# Patient Record
Sex: Female | Born: 1955 | Race: Black or African American | Hispanic: No | Marital: Single | Smoking: Former smoker
Health system: Southern US, Community
[De-identification: ages and names within clinical notes are randomized; demographics above are authoritative.]

## PROBLEM LIST (undated history)

## (undated) DIAGNOSIS — R0600 Dyspnea, unspecified: Secondary | ICD-10-CM

## (undated) DIAGNOSIS — G4700 Insomnia, unspecified: Secondary | ICD-10-CM

## (undated) DIAGNOSIS — I10 Essential (primary) hypertension: Secondary | ICD-10-CM

## (undated) DIAGNOSIS — B1920 Unspecified viral hepatitis C without hepatic coma: Secondary | ICD-10-CM

## (undated) DIAGNOSIS — Z87442 Personal history of urinary calculi: Secondary | ICD-10-CM

## (undated) DIAGNOSIS — E559 Vitamin D deficiency, unspecified: Secondary | ICD-10-CM

## (undated) DIAGNOSIS — Z9221 Personal history of antineoplastic chemotherapy: Secondary | ICD-10-CM

## (undated) DIAGNOSIS — N393 Stress incontinence (female) (male): Secondary | ICD-10-CM

## (undated) DIAGNOSIS — J189 Pneumonia, unspecified organism: Secondary | ICD-10-CM

## (undated) DIAGNOSIS — F32A Depression, unspecified: Secondary | ICD-10-CM

## (undated) DIAGNOSIS — D649 Anemia, unspecified: Secondary | ICD-10-CM

## (undated) DIAGNOSIS — K219 Gastro-esophageal reflux disease without esophagitis: Secondary | ICD-10-CM

## (undated) DIAGNOSIS — M1990 Unspecified osteoarthritis, unspecified site: Secondary | ICD-10-CM

## (undated) DIAGNOSIS — F419 Anxiety disorder, unspecified: Secondary | ICD-10-CM

## (undated) DIAGNOSIS — R413 Other amnesia: Secondary | ICD-10-CM

## (undated) DIAGNOSIS — Z8572 Personal history of non-Hodgkin lymphomas: Secondary | ICD-10-CM

## (undated) DIAGNOSIS — F329 Major depressive disorder, single episode, unspecified: Secondary | ICD-10-CM

## (undated) DIAGNOSIS — F4310 Post-traumatic stress disorder, unspecified: Secondary | ICD-10-CM

## (undated) DIAGNOSIS — C8590 Non-Hodgkin lymphoma, unspecified, unspecified site: Secondary | ICD-10-CM

## (undated) HISTORY — DX: Vitamin D deficiency, unspecified: E55.9

## (undated) HISTORY — DX: Non-Hodgkin lymphoma, unspecified, unspecified site: C85.90

## (undated) HISTORY — DX: Unspecified viral hepatitis C without hepatic coma: B19.20

## (undated) HISTORY — DX: Personal history of antineoplastic chemotherapy: Z92.21

## (undated) HISTORY — DX: Depression, unspecified: F32.A

## (undated) HISTORY — DX: Insomnia, unspecified: G47.00

## (undated) HISTORY — DX: Personal history of non-Hodgkin lymphomas: Z85.72

## (undated) HISTORY — PX: APPENDECTOMY: SHX54

## (undated) HISTORY — PX: TRIGGER FINGER RELEASE: SHX641

## (undated) HISTORY — PX: ABDOMINAL HYSTERECTOMY: SUR658

## (undated) HISTORY — DX: Major depressive disorder, single episode, unspecified: F32.9

---

## 1997-07-15 ENCOUNTER — Encounter: Admission: RE | Admit: 1997-07-15 | Discharge: 1997-07-15 | Payer: Self-pay | Admitting: *Deleted

## 2004-01-10 DIAGNOSIS — M797 Fibromyalgia: Secondary | ICD-10-CM

## 2004-01-10 HISTORY — DX: Fibromyalgia: M79.7

## 2004-06-05 ENCOUNTER — Emergency Department (HOSPITAL_COMMUNITY): Admission: EM | Admit: 2004-06-05 | Discharge: 2004-06-05 | Payer: Self-pay | Admitting: Emergency Medicine

## 2004-10-11 ENCOUNTER — Ambulatory Visit: Payer: Self-pay | Admitting: Gastroenterology

## 2004-11-08 ENCOUNTER — Encounter (INDEPENDENT_AMBULATORY_CARE_PROVIDER_SITE_OTHER): Payer: Self-pay | Admitting: Specialist

## 2004-11-08 ENCOUNTER — Ambulatory Visit (HOSPITAL_COMMUNITY): Admission: RE | Admit: 2004-11-08 | Discharge: 2004-11-08 | Payer: Self-pay | Admitting: Gastroenterology

## 2004-11-13 ENCOUNTER — Emergency Department (HOSPITAL_COMMUNITY): Admission: EM | Admit: 2004-11-13 | Discharge: 2004-11-14 | Payer: Self-pay | Admitting: Emergency Medicine

## 2004-12-16 ENCOUNTER — Ambulatory Visit: Payer: Self-pay | Admitting: Gastroenterology

## 2005-05-21 ENCOUNTER — Emergency Department (HOSPITAL_COMMUNITY): Admission: EM | Admit: 2005-05-21 | Discharge: 2005-05-21 | Payer: Self-pay | Admitting: Emergency Medicine

## 2005-10-03 ENCOUNTER — Ambulatory Visit: Payer: Self-pay | Admitting: *Deleted

## 2005-10-03 ENCOUNTER — Ambulatory Visit: Payer: Self-pay | Admitting: Internal Medicine

## 2005-10-13 ENCOUNTER — Ambulatory Visit: Payer: Self-pay | Admitting: Nurse Practitioner

## 2005-11-02 ENCOUNTER — Ambulatory Visit: Payer: Self-pay | Admitting: Nurse Practitioner

## 2005-11-27 ENCOUNTER — Ambulatory Visit: Payer: Self-pay | Admitting: Nurse Practitioner

## 2005-11-27 ENCOUNTER — Other Ambulatory Visit: Admission: RE | Admit: 2005-11-27 | Discharge: 2005-11-27 | Payer: Self-pay | Admitting: Family Medicine

## 2005-12-05 ENCOUNTER — Encounter: Admission: RE | Admit: 2005-12-05 | Discharge: 2005-12-05 | Payer: Self-pay | Admitting: Family Medicine

## 2006-02-14 ENCOUNTER — Encounter (INDEPENDENT_AMBULATORY_CARE_PROVIDER_SITE_OTHER): Payer: Self-pay | Admitting: Specialist

## 2006-02-14 ENCOUNTER — Ambulatory Visit: Payer: Self-pay | Admitting: Nurse Practitioner

## 2006-02-14 ENCOUNTER — Other Ambulatory Visit: Admission: RE | Admit: 2006-02-14 | Discharge: 2006-02-14 | Payer: Self-pay | Admitting: Nurse Practitioner

## 2006-02-28 ENCOUNTER — Ambulatory Visit: Payer: Self-pay | Admitting: Nurse Practitioner

## 2006-03-14 ENCOUNTER — Ambulatory Visit: Payer: Self-pay | Admitting: Family Medicine

## 2006-03-29 ENCOUNTER — Ambulatory Visit: Payer: Self-pay | Admitting: Family Medicine

## 2006-03-29 ENCOUNTER — Other Ambulatory Visit: Admission: RE | Admit: 2006-03-29 | Discharge: 2006-03-29 | Payer: Self-pay | Admitting: Nurse Practitioner

## 2006-03-29 ENCOUNTER — Encounter (INDEPENDENT_AMBULATORY_CARE_PROVIDER_SITE_OTHER): Payer: Self-pay | Admitting: *Deleted

## 2006-04-11 ENCOUNTER — Ambulatory Visit: Payer: Self-pay | Admitting: Nurse Practitioner

## 2006-04-13 ENCOUNTER — Ambulatory Visit (HOSPITAL_COMMUNITY): Admission: RE | Admit: 2006-04-13 | Discharge: 2006-04-13 | Payer: Self-pay | Admitting: Family Medicine

## 2006-05-03 ENCOUNTER — Ambulatory Visit: Payer: Self-pay | Admitting: Nurse Practitioner

## 2006-07-10 ENCOUNTER — Ambulatory Visit: Payer: Self-pay | Admitting: Internal Medicine

## 2006-09-26 ENCOUNTER — Encounter (INDEPENDENT_AMBULATORY_CARE_PROVIDER_SITE_OTHER): Payer: Self-pay | Admitting: *Deleted

## 2007-05-03 ENCOUNTER — Ambulatory Visit: Payer: Self-pay | Admitting: Internal Medicine

## 2007-05-03 ENCOUNTER — Encounter (INDEPENDENT_AMBULATORY_CARE_PROVIDER_SITE_OTHER): Payer: Self-pay | Admitting: Nurse Practitioner

## 2007-05-03 LAB — CONVERTED CEMR LAB
ALT: 56 units/L — ABNORMAL HIGH (ref 0–35)
AST: 45 units/L — ABNORMAL HIGH (ref 0–37)
Albumin: 4 g/dL (ref 3.5–5.2)
Alkaline Phosphatase: 96 units/L (ref 39–117)
BUN: 5 mg/dL — ABNORMAL LOW (ref 6–23)
Basophils Absolute: 0 10*3/uL (ref 0.0–0.1)
Basophils Relative: 1 % (ref 0–1)
CO2: 24 meq/L (ref 19–32)
Calcium: 9.1 mg/dL (ref 8.4–10.5)
Chloride: 105 meq/L (ref 96–112)
Creatinine, Ser: 0.62 mg/dL (ref 0.40–1.20)
Eosinophils Absolute: 0.1 10*3/uL (ref 0.0–0.7)
Eosinophils Relative: 3 % (ref 0–5)
Glucose, Bld: 89 mg/dL (ref 70–99)
HCT: 40.7 % (ref 36.0–46.0)
Hemoglobin: 12.9 g/dL (ref 12.0–15.0)
Lymphocytes Relative: 38 % (ref 12–46)
Lymphs Abs: 1.8 10*3/uL (ref 0.7–4.0)
MCHC: 31.7 g/dL (ref 30.0–36.0)
MCV: 80.4 fL (ref 78.0–100.0)
Monocytes Absolute: 0.5 10*3/uL (ref 0.1–1.0)
Monocytes Relative: 10 % (ref 3–12)
Neutro Abs: 2.2 10*3/uL (ref 1.7–7.7)
Neutrophils Relative %: 49 % (ref 43–77)
Platelets: 235 10*3/uL (ref 150–400)
Potassium: 3.8 meq/L (ref 3.5–5.3)
RBC: 5.06 M/uL (ref 3.87–5.11)
RDW: 13.6 % (ref 11.5–15.5)
Sodium: 141 meq/L (ref 135–145)
TSH: 1.838 microintl units/mL (ref 0.350–5.50)
Total Bilirubin: 0.5 mg/dL (ref 0.3–1.2)
Total Protein: 8.3 g/dL (ref 6.0–8.3)
WBC: 4.6 10*3/uL (ref 4.0–10.5)

## 2007-07-03 ENCOUNTER — Ambulatory Visit: Payer: Self-pay | Admitting: Family Medicine

## 2007-07-04 ENCOUNTER — Encounter (INDEPENDENT_AMBULATORY_CARE_PROVIDER_SITE_OTHER): Payer: Self-pay | Admitting: Family Medicine

## 2008-04-28 ENCOUNTER — Ambulatory Visit: Payer: Self-pay | Admitting: Internal Medicine

## 2008-05-04 ENCOUNTER — Ambulatory Visit (HOSPITAL_COMMUNITY): Admission: RE | Admit: 2008-05-04 | Discharge: 2008-05-04 | Payer: Self-pay | Admitting: Internal Medicine

## 2008-05-06 ENCOUNTER — Encounter (INDEPENDENT_AMBULATORY_CARE_PROVIDER_SITE_OTHER): Payer: Self-pay | Admitting: *Deleted

## 2008-05-26 ENCOUNTER — Ambulatory Visit: Payer: Self-pay | Admitting: Internal Medicine

## 2008-06-26 ENCOUNTER — Ambulatory Visit: Payer: Self-pay | Admitting: Internal Medicine

## 2008-07-02 ENCOUNTER — Ambulatory Visit: Payer: Self-pay | Admitting: Internal Medicine

## 2008-07-16 ENCOUNTER — Ambulatory Visit: Payer: Self-pay | Admitting: Family Medicine

## 2008-08-14 ENCOUNTER — Ambulatory Visit: Payer: Self-pay | Admitting: Gastroenterology

## 2008-09-02 ENCOUNTER — Ambulatory Visit: Payer: Self-pay | Admitting: Internal Medicine

## 2008-09-25 ENCOUNTER — Encounter (INDEPENDENT_AMBULATORY_CARE_PROVIDER_SITE_OTHER): Payer: Self-pay | Admitting: Internal Medicine

## 2008-09-25 ENCOUNTER — Ambulatory Visit: Payer: Self-pay | Admitting: Internal Medicine

## 2008-09-25 LAB — CONVERTED CEMR LAB
ALT: 38 units/L — ABNORMAL HIGH (ref 0–35)
AST: 35 units/L (ref 0–37)
Albumin: 3.9 g/dL (ref 3.5–5.2)
Alkaline Phosphatase: 89 units/L (ref 39–117)
BUN: 8 mg/dL (ref 6–23)
Basophils Absolute: 0 10*3/uL (ref 0.0–0.1)
Basophils Relative: 1 % (ref 0–1)
CO2: 26 meq/L (ref 19–32)
Calcium: 9.1 mg/dL (ref 8.4–10.5)
Chloride: 102 meq/L (ref 96–112)
Creatinine, Ser: 0.64 mg/dL (ref 0.40–1.20)
Eosinophils Absolute: 0.1 10*3/uL (ref 0.0–0.7)
Eosinophils Relative: 2 % (ref 0–5)
Glucose, Bld: 82 mg/dL (ref 70–99)
HCT: 40.7 % (ref 36.0–46.0)
Hemoglobin: 12.9 g/dL (ref 12.0–15.0)
Lymphocytes Relative: 38 % (ref 12–46)
Lymphs Abs: 1.7 10*3/uL (ref 0.7–4.0)
MCHC: 31.7 g/dL (ref 30.0–36.0)
MCV: 79.8 fL (ref 78.0–100.0)
Monocytes Absolute: 0.4 10*3/uL (ref 0.1–1.0)
Monocytes Relative: 9 % (ref 3–12)
Neutro Abs: 2.3 10*3/uL (ref 1.7–7.7)
Neutrophils Relative %: 50 % (ref 43–77)
Platelets: 247 10*3/uL (ref 150–400)
Potassium: 4 meq/L (ref 3.5–5.3)
RBC: 5.1 M/uL (ref 3.87–5.11)
RDW: 13.6 % (ref 11.5–15.5)
Sodium: 139 meq/L (ref 135–145)
TSH: 2.224 microintl units/mL (ref 0.350–4.500)
Total Bilirubin: 0.6 mg/dL (ref 0.3–1.2)
Total Protein: 8.5 g/dL — ABNORMAL HIGH (ref 6.0–8.3)
WBC: 4.6 10*3/uL (ref 4.0–10.5)

## 2008-09-26 ENCOUNTER — Encounter (INDEPENDENT_AMBULATORY_CARE_PROVIDER_SITE_OTHER): Payer: Self-pay | Admitting: Internal Medicine

## 2009-01-06 ENCOUNTER — Ambulatory Visit: Payer: Self-pay | Admitting: Internal Medicine

## 2009-01-06 LAB — CONVERTED CEMR LAB
ALT: 52 units/L — ABNORMAL HIGH (ref 0–35)
ANA Titer 1: 1:160 {titer} — ABNORMAL HIGH
AST: 46 units/L — ABNORMAL HIGH (ref 0–37)
Albumin: 3.9 g/dL (ref 3.5–5.2)
Alkaline Phosphatase: 93 units/L (ref 39–117)
Anti Nuclear Antibody(ANA): POSITIVE — AB
BUN: 9 mg/dL (ref 6–23)
CO2: 22 meq/L (ref 19–32)
Calcium: 8.7 mg/dL (ref 8.4–10.5)
Chloride: 103 meq/L (ref 96–112)
Creatinine, Ser: 0.75 mg/dL (ref 0.40–1.20)
Glucose, Bld: 102 mg/dL — ABNORMAL HIGH (ref 70–99)
Potassium: 4 meq/L (ref 3.5–5.3)
Rheumatoid fact SerPl-aCnc: <20 intl units/mL (ref 0–20)
Sodium: 138 meq/L (ref 135–145)
Total Bilirubin: 0.5 mg/dL (ref 0.3–1.2)
Total Protein: 8.1 g/dL (ref 6.0–8.3)

## 2009-02-09 ENCOUNTER — Ambulatory Visit: Payer: Self-pay | Admitting: Internal Medicine

## 2009-02-09 LAB — CONVERTED CEMR LAB
Rheumatoid fact SerPl-aCnc: <20 intl units/mL (ref 0–20)
ds DNA Ab: 10 — ABNORMAL HIGH (ref <5)

## 2009-02-10 ENCOUNTER — Encounter (INDEPENDENT_AMBULATORY_CARE_PROVIDER_SITE_OTHER): Payer: Self-pay | Admitting: Internal Medicine

## 2009-02-24 ENCOUNTER — Ambulatory Visit: Payer: Self-pay | Admitting: Internal Medicine

## 2009-02-26 ENCOUNTER — Ambulatory Visit (HOSPITAL_COMMUNITY): Admission: RE | Admit: 2009-02-26 | Discharge: 2009-02-26 | Payer: Self-pay | Admitting: Internal Medicine

## 2009-03-03 ENCOUNTER — Ambulatory Visit: Payer: Self-pay | Admitting: Internal Medicine

## 2009-03-31 ENCOUNTER — Ambulatory Visit: Payer: Self-pay | Admitting: Internal Medicine

## 2009-04-05 ENCOUNTER — Ambulatory Visit: Payer: Self-pay | Admitting: Internal Medicine

## 2009-04-28 ENCOUNTER — Ambulatory Visit: Payer: Self-pay | Admitting: Internal Medicine

## 2009-04-29 ENCOUNTER — Ambulatory Visit: Payer: Self-pay | Admitting: Internal Medicine

## 2009-05-05 ENCOUNTER — Ambulatory Visit (HOSPITAL_COMMUNITY): Admission: RE | Admit: 2009-05-05 | Discharge: 2009-05-05 | Payer: Self-pay | Admitting: Internal Medicine

## 2009-06-09 ENCOUNTER — Ambulatory Visit: Payer: Self-pay | Admitting: Internal Medicine

## 2009-06-09 LAB — CONVERTED CEMR LAB
ALT: 31 units/L (ref 0–35)
AST: 34 units/L (ref 0–37)
Albumin: 3.9 g/dL (ref 3.5–5.2)
Alkaline Phosphatase: 82 units/L (ref 39–117)
BUN: 6 mg/dL (ref 6–23)
CO2: 24 meq/L (ref 19–32)
CRP: 1.4 mg/dL — ABNORMAL HIGH (ref <0.6)
Calcium: 9 mg/dL (ref 8.4–10.5)
Chloride: 102 meq/L (ref 96–112)
Creatinine, Ser: 0.73 mg/dL (ref 0.40–1.20)
Glucose, Bld: 85 mg/dL (ref 70–99)
Potassium: 4 meq/L (ref 3.5–5.3)
Sodium: 135 meq/L (ref 135–145)
Total Bilirubin: 0.5 mg/dL (ref 0.3–1.2)
Total Protein: 8.4 g/dL — ABNORMAL HIGH (ref 6.0–8.3)

## 2009-07-13 ENCOUNTER — Ambulatory Visit: Payer: Self-pay | Admitting: Internal Medicine

## 2009-07-13 LAB — CONVERTED CEMR LAB
BUN: 8 mg/dL (ref 6–23)
CO2: 25 meq/L (ref 19–32)
Calcium: 9.1 mg/dL (ref 8.4–10.5)
Chloride: 106 meq/L (ref 96–112)
Creatinine, Ser: 0.75 mg/dL (ref 0.40–1.20)
Glucose, Bld: 76 mg/dL (ref 70–99)
Potassium: 4.3 meq/L (ref 3.5–5.3)
Sodium: 136 meq/L (ref 135–145)
Uric Acid, Serum: 7.6 mg/dL — ABNORMAL HIGH (ref 2.4–7.0)

## 2009-07-14 ENCOUNTER — Ambulatory Visit (HOSPITAL_COMMUNITY): Admission: RE | Admit: 2009-07-14 | Discharge: 2009-07-14 | Payer: Self-pay | Admitting: Internal Medicine

## 2009-07-14 ENCOUNTER — Encounter (INDEPENDENT_AMBULATORY_CARE_PROVIDER_SITE_OTHER): Payer: Self-pay | Admitting: Internal Medicine

## 2009-07-20 ENCOUNTER — Ambulatory Visit: Payer: Self-pay | Admitting: Internal Medicine

## 2009-08-12 ENCOUNTER — Ambulatory Visit: Payer: Self-pay | Admitting: Internal Medicine

## 2009-08-13 ENCOUNTER — Ambulatory Visit: Payer: Self-pay | Admitting: Internal Medicine

## 2009-09-07 ENCOUNTER — Ambulatory Visit: Payer: Self-pay | Admitting: Internal Medicine

## 2009-09-15 ENCOUNTER — Ambulatory Visit: Payer: Self-pay | Admitting: Obstetrics & Gynecology

## 2009-09-16 ENCOUNTER — Encounter: Payer: Self-pay | Admitting: Obstetrics & Gynecology

## 2010-03-24 LAB — POCT URINALYSIS DIPSTICK
Bilirubin Urine: NEGATIVE
Glucose, UA: NEGATIVE mg/dL
Ketones, ur: NEGATIVE mg/dL
Nitrite: NEGATIVE
Protein, ur: NEGATIVE mg/dL
Specific Gravity, Urine: <=1.005 (ref 1.005–1.030)
Urobilinogen, UA: 0.2 mg/dL (ref 0.0–1.0)
pH: 5 (ref 5.0–8.0)

## 2010-03-28 ENCOUNTER — Other Ambulatory Visit (HOSPITAL_COMMUNITY): Payer: Self-pay | Admitting: Internal Medicine

## 2010-03-28 DIAGNOSIS — Z1231 Encounter for screening mammogram for malignant neoplasm of breast: Secondary | ICD-10-CM

## 2010-05-09 ENCOUNTER — Ambulatory Visit (HOSPITAL_COMMUNITY)
Admission: RE | Admit: 2010-05-09 | Discharge: 2010-05-09 | Disposition: A | Discharge status: Home / Self Care | Payer: Self-pay | Source: Ambulatory Visit | Attending: Internal Medicine | Admitting: Internal Medicine

## 2010-05-09 DIAGNOSIS — Z1231 Encounter for screening mammogram for malignant neoplasm of breast: Secondary | ICD-10-CM | POA: Insufficient documentation

## 2010-10-17 DIAGNOSIS — M159 Polyosteoarthritis, unspecified: Secondary | ICD-10-CM | POA: Insufficient documentation

## 2010-10-31 ENCOUNTER — Emergency Department (HOSPITAL_COMMUNITY)
Admission: EM | Admit: 2010-10-31 | Discharge: 2010-11-01 | Disposition: A | Discharge status: Home / Self Care | Payer: Self-pay | Attending: Emergency Medicine | Admitting: Emergency Medicine

## 2010-10-31 ENCOUNTER — Emergency Department (HOSPITAL_COMMUNITY): Payer: Self-pay

## 2010-10-31 DIAGNOSIS — R079 Chest pain, unspecified: Secondary | ICD-10-CM | POA: Insufficient documentation

## 2010-10-31 DIAGNOSIS — R112 Nausea with vomiting, unspecified: Secondary | ICD-10-CM | POA: Insufficient documentation

## 2010-10-31 DIAGNOSIS — I10 Essential (primary) hypertension: Secondary | ICD-10-CM | POA: Insufficient documentation

## 2010-10-31 DIAGNOSIS — R0602 Shortness of breath: Secondary | ICD-10-CM | POA: Insufficient documentation

## 2010-10-31 DIAGNOSIS — Z8619 Personal history of other infectious and parasitic diseases: Secondary | ICD-10-CM | POA: Insufficient documentation

## 2010-10-31 DIAGNOSIS — K219 Gastro-esophageal reflux disease without esophagitis: Secondary | ICD-10-CM | POA: Insufficient documentation

## 2010-10-31 LAB — POCT I-STAT TROPONIN I: Troponin i, poc: 0 ng/mL (ref 0.00–0.08)

## 2010-10-31 LAB — POCT I-STAT, CHEM 8
Calcium, Ion: 1.24 mmol/L (ref 1.12–1.32)
Chloride: 103 mEq/L (ref 96–112)
Creatinine, Ser: 0.8 mg/dL (ref 0.50–1.10)
Glucose, Bld: 102 mg/dL — ABNORMAL HIGH (ref 70–99)
HCT: 46 % (ref 36.0–46.0)
Potassium: 4.1 mEq/L (ref 3.5–5.1)
TCO2: 25 mmol/L (ref 0–100)

## 2010-11-01 LAB — HEPATIC FUNCTION PANEL
Bilirubin, Direct: 0.1 mg/dL (ref 0.0–0.3)
Indirect Bilirubin: 0.4 mg/dL (ref 0.3–0.9)
Total Bilirubin: 0.5 mg/dL (ref 0.3–1.2)

## 2010-11-01 LAB — CK TOTAL AND CKMB (NOT AT ARMC)
CK, MB: 1 ng/mL (ref 0.3–4.0)
Relative Index: INVALID (ref 0.0–2.5)
Total CK: 98 U/L (ref 7–177)

## 2010-11-01 LAB — CBC
MCH: 25.6 pg — ABNORMAL LOW (ref 26.0–34.0)
MCHC: 32.5 g/dL (ref 30.0–36.0)
MCV: 78.7 fL (ref 78.0–100.0)
Platelets: 226 10*3/uL (ref 150–400)
RDW: 13.1 % (ref 11.5–15.5)
WBC: 5 10*3/uL (ref 4.0–10.5)

## 2010-11-01 LAB — DIFFERENTIAL
Eosinophils Absolute: 0.2 10*3/uL (ref 0.0–0.7)
Eosinophils Relative: 3 % (ref 0–5)
Lymphs Abs: 2.5 10*3/uL (ref 0.7–4.0)
Monocytes Absolute: 0.5 10*3/uL (ref 0.1–1.0)
Monocytes Relative: 10 % (ref 3–12)

## 2010-11-01 LAB — LIPASE, BLOOD: Lipase: 34 U/L (ref 11–59)

## 2010-11-25 ENCOUNTER — Ambulatory Visit: Payer: Self-pay | Admitting: Internal Medicine

## 2011-01-17 ENCOUNTER — Other Ambulatory Visit: Payer: Self-pay | Admitting: Gastroenterology

## 2011-01-17 ENCOUNTER — Ambulatory Visit (HOSPITAL_COMMUNITY)
Admission: RE | Admit: 2011-01-17 | Discharge: 2011-01-17 | Disposition: A | Discharge status: Home / Self Care | Payer: Self-pay | Source: Ambulatory Visit | Attending: Gastroenterology | Admitting: Gastroenterology

## 2011-01-17 ENCOUNTER — Encounter (HOSPITAL_COMMUNITY): Payer: Self-pay

## 2011-01-17 ENCOUNTER — Encounter (HOSPITAL_COMMUNITY)
Admission: RE | Disposition: A | Discharge status: Home / Self Care | Payer: Self-pay | Source: Ambulatory Visit | Attending: Gastroenterology

## 2011-01-17 DIAGNOSIS — K319 Disease of stomach and duodenum, unspecified: Secondary | ICD-10-CM | POA: Insufficient documentation

## 2011-01-17 DIAGNOSIS — K921 Melena: Secondary | ICD-10-CM | POA: Insufficient documentation

## 2011-01-17 DIAGNOSIS — K648 Other hemorrhoids: Secondary | ICD-10-CM | POA: Insufficient documentation

## 2011-01-17 HISTORY — PX: COLONOSCOPY: SHX5424

## 2011-01-17 HISTORY — DX: Essential (primary) hypertension: I10

## 2011-01-17 HISTORY — DX: Anemia, unspecified: D64.9

## 2011-01-17 HISTORY — DX: Gastro-esophageal reflux disease without esophagitis: K21.9

## 2011-01-17 HISTORY — DX: Unspecified osteoarthritis, unspecified site: M19.90

## 2011-01-17 HISTORY — PX: ESOPHAGOGASTRODUODENOSCOPY: SHX5428

## 2011-01-17 HISTORY — DX: Anxiety disorder, unspecified: F41.9

## 2011-01-17 SURGERY — EGD (ESOPHAGOGASTRODUODENOSCOPY)
Anesthesia: Moderate Sedation

## 2011-01-17 MED ORDER — MIDAZOLAM HCL 10 MG/2ML IJ SOLN
INTRAMUSCULAR | Status: DC | PRN
  Administered 2011-01-17: 1 mg via INTRAVENOUS
  Administered 2011-01-17 (×3): 2 mg via INTRAVENOUS
  Administered 2011-01-17: 1 mg via INTRAVENOUS
  Administered 2011-01-17: 2 mg via INTRAVENOUS

## 2011-01-17 MED ORDER — FENTANYL CITRATE 0.05 MG/ML IJ SOLN
INTRAMUSCULAR | Status: DC | PRN
  Administered 2011-01-17 (×4): 25 ug via INTRAVENOUS

## 2011-01-17 MED ORDER — MIDAZOLAM HCL 10 MG/2ML IJ SOLN
INTRAMUSCULAR | Status: AC
  Filled 2011-01-17: qty 2

## 2011-01-17 MED ORDER — SODIUM CHLORIDE 0.9 % IV SOLN
Freq: Once | INTRAVENOUS | Status: AC
  Administered 2011-01-17: 500 mL via INTRAVENOUS

## 2011-01-17 MED ORDER — FENTANYL CITRATE 0.05 MG/ML IJ SOLN
INTRAMUSCULAR | Status: AC
  Filled 2011-01-17: qty 2

## 2011-01-17 NOTE — Op Note (Signed)
[facility] [address] [place], [place]  [address]  ENDOSCOPY PROCEDURE REPORT  PATIENT:  Rachael, West  MR#:  [id number] BIRTHDATE:  [birth year], 55 yrs. old  GENDER:  female  ENDOSCOPIST:  Carman Ching Referred by:  Marsa Aris [facility],  PROCEDURE DATE:  01/17/2011 PROCEDURE:  EGD with biopsy, 43239 ASA CLASS:  Class II INDICATIONS:  blood in stool , nausea  MEDICATIONS:   Fentanyl 50 mcg IV, Versed 5 mg IV TOPICAL ANESTHETIC:  Cetacaine Spray  DESCRIPTION OF PROCEDURE:   After the risks and benefits of the procedure were explained, informed consent was obtained.  The endoscope was introduced through the mouth and advanced to the second portion of the duodenum.  The instrument was slowly withdrawn as the mucosa was fully examined. <<PROCEDUREIMAGES>>  A nodule was found in the body of the stomach. this had the appearance of a small submucosal nodule. With standard forceps, a biopsy was obtained and sent to pathology.    Retroflexed views revealed no abnormalities.    The scope was then withdrawn from the patient and the procedure completed.  COMPLICATIONS:  A complication of none occurred on 01/17/2011 at.  ENDOSCOPIC IMPRESSION: 1) Nodule in the body of the stomach 2) Blood in stool without obvious cause on EGD RECOMMENDATIONS: 1) Call to scheule a follow-up appointment with primary MD PRN 2) Proceed with a Colonscopy.  ______________________________ Carman Ching  CC:  [facility]  n. eSIGNEDCarman Ching at 01/17/2011 11:05 AM  Susa Loffler, [phone]

## 2011-01-17 NOTE — H&P (Signed)
See scanned H&P from office 

## 2011-01-17 NOTE — Op Note (Signed)
[facility] [address] [place], [place]  [address]  COLONOSCOPY PROCEDURE REPORT  PATIENT:  Rachael West, Rachael West  MR#:  [id number] BIRTHDATE:  [birth year], 55 yrs. old  GENDER:  female ENDOSCOPIST:  Carman Ching REF. BY:  [facility], PROCEDURE DATE:  01/17/2011 PROCEDURE:  Colonoscopy 19147 ASA CLASS:  Class II INDICATIONS:  blood in stool MEDICATIONS:   Fentanyl 100 mcg IV, Versed 10 mg IV  DESCRIPTION OF PROCEDURE:   After the risks and benefits and of the procedure were explained, informed consent was obtained. Digital rectal exam was performed and revealed no abnormalities. The  endoscope was introduced through the anus and advanced to the cecum.  The quality of the prep was fair..  The instrument was then slowly withdrawn as the colon was fully examined. <<PROCEDUREIMAGES>>  FINDINGS:  Internal Hemorrhoids were found in the rectum. Retroflexion was not performed.  The scope was then withdrawn from the patient and the procedure completed.  COMPLICATIONS:  A complication of none occured on 01/17/2011 at. ENDOSCOPIC IMPRESSION: 1) Internal hemorrhoids in the rectum RECOMMENDATIONS: 1) Begin yearly stool checks for blood in 3 years and repeat colonoscopy in 10 years.  REPEAT EXAM:  In 10 year(s) for Colonoscopy.  ______________________________ Carman Ching  CC:  [facility]  n. eSIGNEDCarman Ching at 01/17/2011 11:17 AM  Susa Loffler, [phone]

## 2011-01-18 ENCOUNTER — Encounter (HOSPITAL_COMMUNITY): Payer: Self-pay | Admitting: Gastroenterology

## 2011-01-18 ENCOUNTER — Encounter (HOSPITAL_COMMUNITY): Payer: Self-pay

## 2011-01-27 NOTE — H&P (Signed)
  H+P from office reviewed (scanned) Pt examined Chart reviewed Risks and benefits have been explained to pt. The patient was scheduled for outpatient procedure EGD and colonoscopy [facility]. Her previously performed the H&P was reviewed in detail. Her history was unchanged. Her medications were reviewed individually and she was continuing the same medications. Physical exam: The patient was examined just before the procedure and this is all been noted on previous section of the endoscopy report Impression: One nausea and vomiting 2 epigastric abdominal pain 3 heme positive stools Plan: The patient will undergo EGD and colonoscopy as per all the previous notes. This is being dictated and everything repeated because of the hospital requirement.

## 2011-02-10 DIAGNOSIS — N393 Stress incontinence (female) (male): Secondary | ICD-10-CM | POA: Insufficient documentation

## 2011-02-10 DIAGNOSIS — N3941 Urge incontinence: Secondary | ICD-10-CM | POA: Insufficient documentation

## 2011-05-25 DIAGNOSIS — D649 Anemia, unspecified: Secondary | ICD-10-CM | POA: Insufficient documentation

## 2011-05-25 DIAGNOSIS — K2970 Gastritis, unspecified, without bleeding: Secondary | ICD-10-CM | POA: Insufficient documentation

## 2011-05-25 DIAGNOSIS — B1920 Unspecified viral hepatitis C without hepatic coma: Secondary | ICD-10-CM | POA: Insufficient documentation

## 2011-05-25 DIAGNOSIS — J4520 Mild intermittent asthma, uncomplicated: Secondary | ICD-10-CM | POA: Insufficient documentation

## 2011-05-25 DIAGNOSIS — I10 Essential (primary) hypertension: Secondary | ICD-10-CM | POA: Insufficient documentation

## 2011-05-25 DIAGNOSIS — R0789 Other chest pain: Secondary | ICD-10-CM | POA: Insufficient documentation

## 2011-05-25 DIAGNOSIS — B182 Chronic viral hepatitis C: Secondary | ICD-10-CM | POA: Insufficient documentation

## 2011-05-25 DIAGNOSIS — J45909 Unspecified asthma, uncomplicated: Secondary | ICD-10-CM | POA: Insufficient documentation

## 2011-05-25 DIAGNOSIS — Z8639 Personal history of other endocrine, nutritional and metabolic disease: Secondary | ICD-10-CM | POA: Insufficient documentation

## 2011-05-25 DIAGNOSIS — K2950 Unspecified chronic gastritis without bleeding: Secondary | ICD-10-CM | POA: Insufficient documentation

## 2011-11-20 ENCOUNTER — Other Ambulatory Visit (HOSPITAL_COMMUNITY): Payer: Self-pay | Admitting: Internal Medicine

## 2011-11-20 DIAGNOSIS — Z1231 Encounter for screening mammogram for malignant neoplasm of breast: Secondary | ICD-10-CM

## 2011-12-12 ENCOUNTER — Ambulatory Visit (HOSPITAL_COMMUNITY)
Admission: RE | Admit: 2011-12-12 | Discharge: 2011-12-12 | Disposition: A | Discharge status: Home / Self Care | Payer: Medicare Other | Source: Ambulatory Visit | Attending: Internal Medicine | Admitting: Internal Medicine

## 2011-12-12 DIAGNOSIS — Z1231 Encounter for screening mammogram for malignant neoplasm of breast: Secondary | ICD-10-CM | POA: Insufficient documentation

## 2012-04-24 DIAGNOSIS — K5900 Constipation, unspecified: Secondary | ICD-10-CM | POA: Insufficient documentation

## 2012-04-24 DIAGNOSIS — K5904 Chronic idiopathic constipation: Secondary | ICD-10-CM | POA: Insufficient documentation

## 2012-08-29 ENCOUNTER — Other Ambulatory Visit: Payer: Self-pay | Admitting: Nurse Practitioner

## 2012-08-29 ENCOUNTER — Other Ambulatory Visit: Payer: Self-pay | Admitting: Internal Medicine

## 2012-08-29 DIAGNOSIS — N644 Mastodynia: Secondary | ICD-10-CM

## 2012-09-18 ENCOUNTER — Ambulatory Visit
Admission: RE | Admit: 2012-09-18 | Discharge: 2012-09-18 | Disposition: A | Discharge status: Home / Self Care | Payer: Medicare Other | Source: Ambulatory Visit | Attending: Nurse Practitioner | Admitting: Nurse Practitioner

## 2012-09-18 DIAGNOSIS — N644 Mastodynia: Secondary | ICD-10-CM

## 2013-01-09 HISTORY — PX: BREAST BIOPSY: SHX20

## 2013-06-19 ENCOUNTER — Other Ambulatory Visit: Payer: Self-pay | Admitting: Family Medicine

## 2013-08-14 ENCOUNTER — Other Ambulatory Visit (HOSPITAL_COMMUNITY): Payer: Self-pay | Admitting: Nurse Practitioner

## 2013-08-14 DIAGNOSIS — Z1231 Encounter for screening mammogram for malignant neoplasm of breast: Secondary | ICD-10-CM

## 2013-09-19 ENCOUNTER — Ambulatory Visit (HOSPITAL_COMMUNITY): Admission: RE | Admit: 2013-09-19 | Payer: Medicare Other | Source: Ambulatory Visit

## 2013-10-21 ENCOUNTER — Ambulatory Visit (HOSPITAL_COMMUNITY)
Admission: RE | Admit: 2013-10-21 | Discharge: 2013-10-21 | Disposition: A | Discharge status: Home / Self Care | Payer: Medicare HMO | Source: Ambulatory Visit | Attending: Nurse Practitioner | Admitting: Nurse Practitioner

## 2013-10-21 DIAGNOSIS — Z1231 Encounter for screening mammogram for malignant neoplasm of breast: Secondary | ICD-10-CM | POA: Diagnosis present

## 2013-11-29 ENCOUNTER — Other Ambulatory Visit: Payer: Self-pay | Admitting: Family Medicine

## 2013-11-30 NOTE — Telephone Encounter (Signed)
Faxed

## 2013-12-05 ENCOUNTER — Other Ambulatory Visit: Payer: Self-pay | Admitting: Family Medicine

## 2013-12-08 NOTE — Telephone Encounter (Signed)
Faxed

## 2013-12-09 DIAGNOSIS — C8590 Non-Hodgkin lymphoma, unspecified, unspecified site: Secondary | ICD-10-CM

## 2013-12-09 HISTORY — DX: Non-Hodgkin lymphoma, unspecified, unspecified site: C85.90

## 2013-12-11 ENCOUNTER — Other Ambulatory Visit (HOSPITAL_COMMUNITY): Payer: Self-pay | Admitting: Diagnostic Radiology

## 2013-12-11 DIAGNOSIS — G5692 Unspecified mononeuropathy of left upper limb: Secondary | ICD-10-CM

## 2013-12-22 ENCOUNTER — Other Ambulatory Visit (HOSPITAL_COMMUNITY): Payer: Self-pay | Admitting: Diagnostic Radiology

## 2013-12-22 ENCOUNTER — Other Ambulatory Visit: Payer: Self-pay | Admitting: Nurse Practitioner

## 2013-12-22 ENCOUNTER — Ambulatory Visit (HOSPITAL_COMMUNITY)
Admission: RE | Admit: 2013-12-22 | Discharge: 2013-12-22 | Disposition: A | Discharge status: Home / Self Care | Payer: Medicare HMO | Source: Ambulatory Visit | Attending: Diagnostic Radiology | Admitting: Diagnostic Radiology

## 2013-12-22 DIAGNOSIS — N6452 Nipple discharge: Secondary | ICD-10-CM

## 2013-12-22 DIAGNOSIS — N63 Unspecified lump in unspecified breast: Secondary | ICD-10-CM

## 2013-12-22 DIAGNOSIS — R2232 Localized swelling, mass and lump, left upper limb: Secondary | ICD-10-CM

## 2013-12-22 DIAGNOSIS — G5692 Unspecified mononeuropathy of left upper limb: Secondary | ICD-10-CM

## 2013-12-31 ENCOUNTER — Other Ambulatory Visit: Payer: Self-pay | Admitting: Nurse Practitioner

## 2013-12-31 DIAGNOSIS — R2232 Localized swelling, mass and lump, left upper limb: Secondary | ICD-10-CM

## 2013-12-31 DIAGNOSIS — N63 Unspecified lump in unspecified breast: Secondary | ICD-10-CM

## 2013-12-31 DIAGNOSIS — N6452 Nipple discharge: Secondary | ICD-10-CM

## 2014-01-05 ENCOUNTER — Ambulatory Visit
Admission: RE | Admit: 2014-01-05 | Discharge: 2014-01-05 | Disposition: A | Discharge status: Home / Self Care | Payer: Commercial Managed Care - HMO | Source: Ambulatory Visit | Attending: Nurse Practitioner | Admitting: Nurse Practitioner

## 2014-01-05 ENCOUNTER — Other Ambulatory Visit (HOSPITAL_COMMUNITY): Payer: Self-pay | Admitting: Diagnostic Radiology

## 2014-01-05 ENCOUNTER — Other Ambulatory Visit: Payer: Self-pay | Admitting: Nurse Practitioner

## 2014-01-05 DIAGNOSIS — N6452 Nipple discharge: Secondary | ICD-10-CM

## 2014-01-05 DIAGNOSIS — R2232 Localized swelling, mass and lump, left upper limb: Secondary | ICD-10-CM

## 2014-01-05 DIAGNOSIS — N63 Unspecified lump in unspecified breast: Secondary | ICD-10-CM

## 2014-01-08 ENCOUNTER — Telehealth: Payer: Self-pay | Admitting: Hematology and Oncology

## 2014-01-08 NOTE — Telephone Encounter (Signed)
S/W PATIENT AND GAVE NP APPT FOR 01/04 @ 12:30 W/DR. GUDENA.  DX- NEWLY NON-HODGKIN B-CELL LYMPHOMA

## 2014-01-12 ENCOUNTER — Ambulatory Visit: Payer: Commercial Managed Care - HMO

## 2014-01-12 ENCOUNTER — Other Ambulatory Visit: Payer: Self-pay | Admitting: *Deleted

## 2014-01-12 ENCOUNTER — Ambulatory Visit (HOSPITAL_BASED_OUTPATIENT_CLINIC_OR_DEPARTMENT_OTHER): Payer: Medicare HMO | Admitting: Hematology and Oncology

## 2014-01-12 VITALS — BP 138/66 | HR 99 | Temp 98.3°F | Resp 18 | Ht 65.5 in | Wt 226.6 lb

## 2014-01-12 DIAGNOSIS — C8510 Unspecified B-cell lymphoma, unspecified site: Secondary | ICD-10-CM

## 2014-01-12 DIAGNOSIS — C8518 Unspecified B-cell lymphoma, lymph nodes of multiple sites: Secondary | ICD-10-CM

## 2014-01-12 HISTORY — DX: Unspecified B-cell lymphoma, unspecified site: C85.10

## 2014-01-12 LAB — COMPREHENSIVE METABOLIC PANEL (CC13)
ALT: 29 U/L (ref 0–55)
AST: 38 U/L — AB (ref 5–34)
Albumin: 3.4 g/dL — ABNORMAL LOW (ref 3.5–5.0)
Alkaline Phosphatase: 64 U/L (ref 40–150)
Anion Gap: 7 mEq/L (ref 3–11)
BUN: 10.1 mg/dL (ref 7.0–26.0)
CHLORIDE: 104 meq/L (ref 98–109)
CO2: 26 mEq/L (ref 22–29)
CREATININE: 0.9 mg/dL (ref 0.6–1.1)
Calcium: 9.1 mg/dL (ref 8.4–10.4)
EGFR: 81 mL/min/{1.73_m2} — ABNORMAL LOW (ref >90)
GLUCOSE: 101 mg/dL (ref 70–140)
Potassium: 4.4 mEq/L (ref 3.5–5.1)
Sodium: 138 mEq/L (ref 136–145)
Total Bilirubin: 0.41 mg/dL (ref 0.20–1.20)
Total Protein: 7.9 g/dL (ref 6.4–8.3)

## 2014-01-12 LAB — CBC WITH DIFFERENTIAL/PLATELET
BASO%: 0.8 % (ref 0.0–2.0)
BASOS ABS: 0 10*3/uL (ref 0.0–0.1)
EOS%: 4.5 % (ref 0.0–7.0)
Eosinophils Absolute: 0.2 10*3/uL (ref 0.0–0.5)
HEMATOCRIT: 35.6 % (ref 34.8–46.6)
HGB: 11.4 g/dL — ABNORMAL LOW (ref 11.6–15.9)
LYMPH%: 39.5 % (ref 14.0–49.7)
MCH: 24.7 pg — AB (ref 25.1–34.0)
MCHC: 32 g/dL (ref 31.5–36.0)
MCV: 77.2 fL — AB (ref 79.5–101.0)
MONO#: 0.5 10*3/uL (ref 0.1–0.9)
MONO%: 9.8 % (ref 0.0–14.0)
NEUT#: 2.2 10*3/uL (ref 1.5–6.5)
NEUT%: 45.4 % (ref 38.4–76.8)
Platelets: 176 10*3/uL (ref 145–400)
RBC: 4.61 10*6/uL (ref 3.70–5.45)
RDW: 14.8 % — ABNORMAL HIGH (ref 11.2–14.5)
WBC: 4.9 10*3/uL (ref 3.9–10.3)
lymph#: 1.9 10*3/uL (ref 0.9–3.3)

## 2014-01-12 LAB — LACTATE DEHYDROGENASE (CC13): LDH: 396 U/L — AB (ref 125–245)

## 2014-01-12 MED ORDER — PROCHLORPERAZINE MALEATE 10 MG PO TABS: 10.0000 mg | ORAL_TABLET | Freq: Four times a day (QID) | ORAL | Status: DC | PRN

## 2014-01-12 MED ORDER — ONDANSETRON HCL 8 MG PO TABS
8.0000 mg | ORAL_TABLET | Freq: Two times a day (BID) | ORAL | Status: DC
Start: 2014-01-12 — End: 2014-04-10

## 2014-01-12 MED ORDER — ALLOPURINOL 300 MG PO TABS
300.0000 mg | ORAL_TABLET | Freq: Every day | ORAL | Status: DC
Start: 2014-01-12 — End: 2014-03-13

## 2014-01-12 MED ORDER — PREDNISONE 20 MG PO TABS: 60.0000 mg | ORAL_TABLET | Freq: Every day | ORAL | Status: DC

## 2014-01-12 MED ORDER — LORAZEPAM 0.5 MG PO TABS: 0.5000 mg | ORAL_TABLET | Freq: Four times a day (QID) | ORAL | Status: DC | PRN

## 2014-01-12 MED ORDER — LIDOCAINE-PRILOCAINE 2.5-2.5 % EX CREA: TOPICAL_CREAM | CUTANEOUS | Status: DC

## 2014-01-12 NOTE — Progress Notes (Signed)
[facility] CONSULT NOTE  Patient Care Team: Burtis Junes, MD as PCP - General (Family Medicine)  CHIEF COMPLAINTS/PURPOSE OF CONSULTATION:  Newly diagnosed lymphoma  HISTORY OF PRESENTING ILLNESS:  Rachael West 59 y.o. female is here because of recent diagnosis of lymphoma. She presented with left axillary lymphadenopathy which has been progressively getting worse for the past 2 months. She underwent a mammogram and ultrasound on 01/05/2014. Mammogram was normal but the ultrasound revealed a enlarged left axillary lymph node. She underwent a biopsy of this lymph node which came as aggressive large B-cell lymphoma that was negative for CD5 and CD10 but positive for CD20 BCL-2 and BCL 6 suggestive of aggressive large B-cell lymphoma. In the past week she has noticed increased lymphadenopathy in the left side of the neck which is rapidly increased in size. She reports some night sweats and intermittent fevers of low-grade.  I reviewed her records extensively and collaborated the history with the patient.  SUMMARY OF ONCOLOGIC HISTORY:   Large B-cell lymphoma   01/05/2014 Initial Diagnosis Left axillary lymph node biopsy: Aggressive large B-cell lymphoma high-grade without CD10 or CD5 expression, positive for CD20, BCL-2 and BCL 6 and Ki-67 of 100% (DD: DLBCL vs B cell lymphoma unclassified)   MEDICAL HISTORY:  Past Medical History  Diagnosis Date  . Hypertension   . Asthma   . GERD (gastroesophageal reflux disease)   . Arthritis   . Anemia   . Anxiety   . Hepatitis c    SURGICAL HISTORY: Past Surgical History  Procedure Laterality Date  . Appendectomy    . Trigger finger release    . Esophagogastroduodenoscopy  01/17/2011    Procedure: ESOPHAGOGASTRODUODENOSCOPY (EGD);  Surgeon: Vertell Novak., MD;  Location: [facility];  Service: Endoscopy;  Laterality: N/A;  . Colonoscopy  01/17/2011    Procedure: COLONOSCOPY;  Surgeon: Vertell Novak., MD;   Location: [facility] ENDOSCOPY;  Service: Endoscopy;  Laterality: N/A;    SOCIAL HISTORY: History   Social History  . Marital Status: Divorced    Spouse Name: N/A    Number of Children: N/A  . Years of Education: N/A   Occupational History  . Not on file.   Social History Main Topics  . Smoking status: Former Games developer  . Smokeless tobacco: Not on file  . Alcohol Use: No  . Drug Use: No  . Sexual Activity: Not on file   Other Topics Concern  . Not on file   Social History Narrative  . No narrative on file    FAMILY HISTORY: No family history on file.  ALLERGIES:  is allergic to latex; penicillins; codeine; and sulfa antibiotics.  MEDICATIONS:  Current Outpatient Prescriptions  Medication Sig Dispense Refill  . albuterol (PROVENTIL) (2.5 MG/3ML) 0.083% nebulizer solution Take 2.5 mg by nebulization every 6 (six) hours as needed.      Marland Kitchen amLODipine (NORVASC) 5 MG tablet Take 5 mg by mouth daily.      . fluticasone-salmeterol (ADVAIR HFA) 115-21 MCG/ACT inhaler Inhale 2 puffs into the lungs 2 (two) times daily.      Marland Kitchen lisinopril (PRINIVIL,ZESTRIL) 5 MG tablet Take 5 mg by mouth daily.      . meloxicam (MOBIC) 15 MG tablet Take 15 mg by mouth daily.      . metoCLOPramide (REGLAN) 10 MG tablet Take 10 mg by mouth 4 (four) times daily.      . pantoprazole (PROTONIX) 40 MG tablet Take 40 mg by  mouth daily.      . solifenacin (VESICARE) 10 MG tablet Take 5 mg by mouth daily.      . traMADol (ULTRAM) 50 MG tablet TAKE 1 TABLET BY MOUTH THREE TIMES DAILY 90 tablet 2  . traZODone (DESYREL) 100 MG tablet Take 100 mg by mouth at bedtime.      . triamcinolone (NASACORT) 55 MCG/ACT nasal inhaler Place 2 sprays into the nose daily.      Marland Kitchen allopurinol (ZYLOPRIM) 300 MG tablet Take 1 tablet (300 mg total) by mouth daily. 30 tablet 1  . lidocaine-prilocaine (EMLA) cream Apply to affected area once 30 g 3  . LORazepam (ATIVAN) 0.5 MG tablet Take 1 tablet (0.5 mg total) by mouth every 6 (six) hours  as needed (Nausea or vomiting). 30 tablet 0  . ondansetron (ZOFRAN) 8 MG tablet Take 1 tablet (8 mg total) by mouth 2 (two) times daily. Start the day after chemo for 3 days. Then as needed for nausea or vomiting. 30 tablet 1  . predniSONE (DELTASONE) 20 MG tablet Take 3 tablets (60 mg total) by mouth daily. Take on days 1-5 of chemotherapy. 15 tablet 5  . prochlorperazine (COMPAZINE) 10 MG tablet Take 1 tablet (10 mg total) by mouth every 6 (six) hours as needed (Nausea or vomiting). 30 tablet 6   No current facility-administered medications for this visit.    REVIEW OF SYSTEMS:   Constitutional: Denies fevers, chills or abnormal night sweats Eyes: Denies blurriness of vision, double vision or watery eyes Ears, nose, mouth, throat, and face: Denies mucositis or sore throat Respiratory: Denies cough, dyspnea or wheezes Cardiovascular: Denies palpitation, chest discomfort or lower extremity swelling Gastrointestinal:  Denies nausea, heartburn or change in bowel habits Skin: Denies abnormal skin rashes Lymphatics: Massively enlarged left axillary and right cervical lymph nodes Neurological:Denies numbness, tingling or new weaknesses Behavioral/Psych: Mood is stable, no new changes  Breast:  Denies any palpable lumps or discharge All other systems were reviewed with the patient and are negative.  PHYSICAL EXAMINATION: ECOG PERFORMANCE STATUS: 1 - Symptomatic but completely ambulatory  Filed Vitals:   01/12/14 1302  BP: 138/66  Pulse: 99  Temp: 98.3 F (36.8 C)  Resp: 18   Filed Weights   01/12/14 1302  Weight: 226 lb 9.6 oz (102.785 kg)    GENERAL:alert, no distress and comfortable SKIN: skin color, texture, turgor are normal, no rashes or significant lesions EYES: normal, conjunctiva are pink and non-injected, sclera clear OROPHARYNX:no exudate, no erythema and lips, buccal mucosa, and tongue normal  NECK: supple, thyroid normal size, non-tender, without nodularity LYMPH:   Very large left axillary lymph node in the anterior axillary fold measuring at least 6 cm in size, massively enlarged left lower cervical lymphadenopathy noted. They seem to be confluent and nontender. LUNGS: clear to auscultation and percussion with normal breathing effort HEART: regular rate & rhythm and no murmurs and no lower extremity edema ABDOMEN:abdomen soft, non-tender and normal bowel sounds Musculoskeletal:no cyanosis of digits and no clubbing  PSYCH: alert & oriented x 3 with fluent speech NEURO: no focal motor/sensory deficits  LABORATORY DATA:  I have reviewed the data as listed Lab Results  Component Value Date   WBC 5.0 10/31/2010   HGB 15.6* 10/31/2010   HCT 46.0 10/31/2010   MCV 78.7 10/31/2010   PLT 226 10/31/2010   Lab Results  Component Value Date   NA 139 10/31/2010   K 4.1 10/31/2010   CL 103 10/31/2010   CO2  25 07/13/2009    RADIOGRAPHIC STUDIES: I have personally reviewed the radiological reports and agreed with the findings in the report.  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 Based on 100% Ki-67, she has a very aggressive large B-cell lymphoma. I briefly discussed the staging process.  Plan: 1. PET/CT scan 2. Port placement: January 6 3. Echocardiogram 4. Bone marrow biopsy January 5:  5. Blood work to check for HIV, hepatitis B and C  prior to Rituxan and chemotherapy.: Today January 4 6. Chemotherapy class   Treatment plan: R CHOP chemotherapy with Neulasta support and consideration for intrathecal chemotherapy given the aggressive nature of her  lymphoma.We will present her in the hematology tumor board   Chemotherapy counseling: I discussed the chemotherapy drugs extensively including the risks and benefits and side effects. She will need R CHOP chemotherapy once every 3 weeks with Neulasta on day 2. Plan is to give her 6 cycles of chemotherapy. Interim restaging with CT scans after 3 cycles and a final PET CT scan after 6 cycles to be performed.  Unfortunately patient's lymphoma appears to be rapidly progressing with massive left cervical lymphadenopathy causing some degree of discomfort in the neck raising concerns that it could compress some critical structures, hence we decided to initiate chemotherapy even before the rest of the workup is complete.     Rulon Eisenmenger, MD 01/12/2014 2:27 PM

## 2014-01-12 NOTE — Assessment & Plan Note (Addendum)
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 Based on 100% Ki-67, Rachael West has a very aggressive large B-cell lymphoma. I briefly discussed the staging process.  Plan: 1. PET/CT scan 2. Port placement: January 6 3. Echocardiogram 4. Bone marrow biopsy January 5:  5. Blood work to check for HIV, hepatitis B and C  prior to Rituxan and chemotherapy.: Today January 4 6. Chemotherapy class   Treatment plan: R CHOP chemotherapy with Neulasta support and consideration for intrathecal chemotherapy given the aggressive nature of her lymphoma.We will present her in the hematology tumor board   Chemotherapy counseling: I discussed the chemotherapy drugs extensively including the risks and benefits and side effects. Rachael West will need R CHOP chemotherapy once every 3 weeks with Neulasta on day 2. Plan is to give her 6 cycles of chemotherapy. Interim restaging with CT scans after 3 cycles and a final PET CT scan after 6 cycles to be performed.  Unfortunately patient's lymphoma appears to be rapidly progressing with massive left cervical lymphadenopathy causing some degree of discomfort in the neck raising concerns that it could compress some critical structures, hence we decided to initiate chemotherapy even before the rest of the workup is complete.

## 2014-01-12 NOTE — Progress Notes (Signed)
Note created by Dr. Gudena during office visit. Copy to patient, original to scan. 

## 2014-01-13 ENCOUNTER — Ambulatory Visit (HOSPITAL_BASED_OUTPATIENT_CLINIC_OR_DEPARTMENT_OTHER): Payer: Medicare HMO | Admitting: Hematology and Oncology

## 2014-01-13 ENCOUNTER — Other Ambulatory Visit: Payer: Self-pay | Admitting: *Deleted

## 2014-01-13 ENCOUNTER — Telehealth: Payer: Self-pay | Admitting: Hematology and Oncology

## 2014-01-13 ENCOUNTER — Other Ambulatory Visit: Payer: Self-pay | Admitting: Radiology

## 2014-01-13 ENCOUNTER — Encounter: Payer: Self-pay | Admitting: Hematology and Oncology

## 2014-01-13 VITALS — BP 145/78 | Temp 98.5°F | Resp 16

## 2014-01-13 DIAGNOSIS — C8510 Unspecified B-cell lymphoma, unspecified site: Secondary | ICD-10-CM

## 2014-01-13 NOTE — Telephone Encounter (Signed)
, °

## 2014-01-13 NOTE — Patient Instructions (Signed)
Bone Marrow Aspiration and Bone Biopsy Examination of the bone marrow is a valuable test to diagnose blood disorders. A bone marrow biopsy takes a sample of bone and a small amount of fluid and cells from inside the bone. A bone marrow aspiration removes only the marrow. Bone marrow aspiration and bone biopsies are used to stage different disorders of the blood, such as leukemia. Staging will help your caregiver understand how far the disease has progressed.  The tests are also useful in diagnosing:  Fever of unknown origin (FUO).  Bacterial infections and other widespread fungal infections.  Cancers that have spread (metastasized) to the bone marrow.  Diseases that are characterized by a deficiency of an enzyme (storage diseases). This includes:  Niemann-Pick disease.  Gaucher disease. PROCEDURE  Sites used to get samples include:   Back of your hip bone (posterior iliac crest).  Both aspiration and biopsy.  Front of your hip bone (anterior iliac crest).  Both aspiration and biopsy.  Breastbone (sternum).  Aspiration from your breastbone (done only in adults). This method is rarely used. When you get a hip bone aspiration:  You are placed lying on your side with the upper knee brought up and flexed with the lower leg straight.  The site is prepared, cleaned with an antiseptic scrub, and draped. This keeps the biopsy area clean.  The skin and the area down to the lining of the bone (periosteum) are made numb with a local anesthetic.  The bone marrow aspiration needle is inserted. You will feel pressure on your bone.  Once inside the marrow cavity, a sample of bone marrow is sucked out (aspirated) for pathology slides.  The material collected for bone marrow slides is processed immediately by a technologist.  The technician selects the marrow particles to make the slides for pathology.  The marrow aspiration needle is removed. Then pressure is applied to the site with  gauze until bleeding has stopped. Following an aspiration, a bone marrow biopsy may be performed as well. The technique for this is very similar. A dressing is then applied.  RISKS AND COMPLICATIONS  The main complications of a bone marrow aspiration and biopsy include infection and bleeding.  Complications are uncommon. The procedure may not be performed in patients with bleeding tendencies.  A very rare complication from the procedure is injury to the heart during a breastbone (sternal) marrow aspiration. Only bone marrow aspirations are performed in this area.  Long-lasting pain at the site of the bone marrow aspiration and biopsy is uncommon. Your caregiver will let you know when you are to get your results and will discuss them with you. You may make an appointment with your caregiver to find out the results. Do not assume everything is normal if you have not heard from your caregiver or the medical facility. It is important for you to follow up on all of your test results. Document Released: 12/30/2003 Document Revised: 03/20/2011 Document Reviewed: 12/24/2007 [facility] Patient Information 2015 [place], [place]. This information is not intended to replace advice given to you by your health care provider. Make sure you discuss any questions you have with your health care provider.

## 2014-01-13 NOTE — Progress Notes (Signed)
Bone Marrow Biopsy and Aspiration Procedure Note   Informed consent was obtained and potential risks including bleeding, infection and pain were reviewed with the patient. The patient's name, date of birth, identification, consent and allergies were verified prior to the start of procedure and time out was performed.  The left posterior iliac crest was chosen as the site of biopsy.  The skin was prepped with Betadine solution.   12 cc of 1% lidocaine was used to provide local anaesthesia using spinal needle.  Attempts were made to biopsy however the failed because the patient was not adequately anesthetized locally. Hence we aborted the procedure and set her up for interventional radiology guided bone marrow biopsy Pressure was applied to the biopsy site and bandage was placed over the biopsy site. Patient was made to lie on her back for 15 mins prior to discharge.  The procedure was tolerated well. COMPLICATIONS: None BLOOD LOSS: none The patient was discharged home in stable condition   Signed Kristapher [place], Tyler Deis, MD

## 2014-01-13 NOTE — Progress Notes (Signed)
Called and left message with lady. She said patient not home but should be in the next 14mins. She left me a message on 01/12/14.

## 2014-01-14 ENCOUNTER — Ambulatory Visit (HOSPITAL_COMMUNITY): Payer: Commercial Managed Care - HMO

## 2014-01-14 ENCOUNTER — Inpatient Hospital Stay (HOSPITAL_COMMUNITY)
Admission: RE | Admit: 2014-01-14 | Discharge: 2014-01-14 | Disposition: A | Discharge status: Home / Self Care | Payer: Commercial Managed Care - HMO | Source: Ambulatory Visit | Attending: Hematology and Oncology | Admitting: Hematology and Oncology

## 2014-01-14 LAB — PROTHROMBIN TIME
INR: 1.14 (ref <1.50)
Prothrombin Time: 14.6 seconds (ref 11.6–15.2)

## 2014-01-14 LAB — HEPATITIS C RNA QUANTITATIVE
HCV Quantitative Log: 4.81 {Log} — ABNORMAL HIGH (ref <1.18)
HCV Quantitative: 65096 IU/mL — ABNORMAL HIGH (ref <15)

## 2014-01-14 LAB — APTT: aPTT: 37 seconds (ref 24–37)

## 2014-01-14 LAB — HIV ANTIBODY (ROUTINE TESTING W REFLEX): HIV 1&2 Ab, 4th Generation: NONREACTIVE

## 2014-01-14 LAB — HEPATITIS C ANTIBODY: HCV Ab: REACTIVE — AB

## 2014-01-14 LAB — HEPATITIS B SURFACE ANTIGEN: Hepatitis B Surface Ag: NEGATIVE

## 2014-01-15 ENCOUNTER — Ambulatory Visit (HOSPITAL_COMMUNITY)
Admission: RE | Admit: 2014-01-15 | Discharge: 2014-01-15 | Disposition: A | Discharge status: Home / Self Care | Payer: Medicare HMO | Source: Ambulatory Visit | Attending: Hematology and Oncology | Admitting: Hematology and Oncology

## 2014-01-15 ENCOUNTER — Encounter (HOSPITAL_COMMUNITY): Payer: Self-pay

## 2014-01-15 ENCOUNTER — Other Ambulatory Visit: Payer: Self-pay | Admitting: Hematology and Oncology

## 2014-01-15 ENCOUNTER — Ambulatory Visit (HOSPITAL_COMMUNITY)
Admission: RE | Admit: 2014-01-15 | Discharge: 2014-01-15 | Disposition: A | Discharge status: Home / Self Care | Payer: Medicare HMO | Source: Ambulatory Visit | Attending: Family Medicine | Admitting: Family Medicine

## 2014-01-15 ENCOUNTER — Telehealth: Payer: Self-pay

## 2014-01-15 DIAGNOSIS — I082 Rheumatic disorders of both aortic and tricuspid valves: Secondary | ICD-10-CM | POA: Insufficient documentation

## 2014-01-15 DIAGNOSIS — J45909 Unspecified asthma, uncomplicated: Secondary | ICD-10-CM | POA: Diagnosis not present

## 2014-01-15 DIAGNOSIS — Z01818 Encounter for other preprocedural examination: Secondary | ICD-10-CM | POA: Diagnosis present

## 2014-01-15 DIAGNOSIS — F419 Anxiety disorder, unspecified: Secondary | ICD-10-CM | POA: Insufficient documentation

## 2014-01-15 DIAGNOSIS — B182 Chronic viral hepatitis C: Secondary | ICD-10-CM | POA: Insufficient documentation

## 2014-01-15 DIAGNOSIS — Z79899 Other long term (current) drug therapy: Secondary | ICD-10-CM | POA: Insufficient documentation

## 2014-01-15 DIAGNOSIS — I369 Nonrheumatic tricuspid valve disorder, unspecified: Secondary | ICD-10-CM

## 2014-01-15 DIAGNOSIS — Z87891 Personal history of nicotine dependence: Secondary | ICD-10-CM | POA: Insufficient documentation

## 2014-01-15 DIAGNOSIS — I10 Essential (primary) hypertension: Secondary | ICD-10-CM | POA: Diagnosis not present

## 2014-01-15 DIAGNOSIS — C8510 Unspecified B-cell lymphoma, unspecified site: Secondary | ICD-10-CM | POA: Insufficient documentation

## 2014-01-15 DIAGNOSIS — B1920 Unspecified viral hepatitis C without hepatic coma: Secondary | ICD-10-CM

## 2014-01-15 DIAGNOSIS — K219 Gastro-esophageal reflux disease without esophagitis: Secondary | ICD-10-CM | POA: Diagnosis not present

## 2014-01-15 LAB — CBC WITH DIFFERENTIAL/PLATELET
Basophils Absolute: 0 10*3/uL (ref 0.0–0.1)
Basophils Relative: 1 % (ref 0–1)
EOS PCT: 5 % (ref 0–5)
Eosinophils Absolute: 0.2 10*3/uL (ref 0.0–0.7)
HCT: 34.6 % — ABNORMAL LOW (ref 36.0–46.0)
Hemoglobin: 10.6 g/dL — ABNORMAL LOW (ref 12.0–15.0)
LYMPHS ABS: 2.1 10*3/uL (ref 0.7–4.0)
Lymphocytes Relative: 44 % (ref 12–46)
MCH: 24.1 pg — AB (ref 26.0–34.0)
MCHC: 30.6 g/dL (ref 30.0–36.0)
MCV: 78.8 fL (ref 78.0–100.0)
Monocytes Absolute: 0.4 10*3/uL (ref 0.1–1.0)
Monocytes Relative: 9 % (ref 3–12)
Neutro Abs: 1.9 10*3/uL (ref 1.7–7.7)
Neutrophils Relative %: 41 % — ABNORMAL LOW (ref 43–77)
Platelets: 182 10*3/uL (ref 150–400)
RBC: 4.39 MIL/uL (ref 3.87–5.11)
RDW: 14.9 % (ref 11.5–15.5)
WBC: 4.7 10*3/uL (ref 4.0–10.5)

## 2014-01-15 LAB — PROTIME-INR
INR: 1.13 (ref 0.00–1.49)
PROTHROMBIN TIME: 14.6 s (ref 11.6–15.2)

## 2014-01-15 LAB — APTT: aPTT: 37 seconds (ref 24–37)

## 2014-01-15 LAB — BONE MARROW EXAM

## 2014-01-15 MED ORDER — FENTANYL CITRATE 0.05 MG/ML IJ SOLN
INTRAMUSCULAR | Status: AC | PRN
  Administered 2014-01-15: 50 ug via INTRAVENOUS

## 2014-01-15 MED ORDER — SODIUM CHLORIDE 0.9 % IV SOLN
INTRAVENOUS | Status: DC
  Administered 2014-01-15: 09:00:00 via INTRAVENOUS

## 2014-01-15 MED ORDER — MIDAZOLAM HCL 2 MG/2ML IJ SOLN
INTRAMUSCULAR | Status: AC
  Filled 2014-01-15: qty 6

## 2014-01-15 MED ORDER — MIDAZOLAM HCL 2 MG/2ML IJ SOLN
INTRAMUSCULAR | Status: AC | PRN
  Administered 2014-01-15: 1 mg via INTRAVENOUS

## 2014-01-15 MED ORDER — VANCOMYCIN HCL 10 G IV SOLR
1500.0000 mg | INTRAVENOUS | Status: AC
  Administered 2014-01-15: 1500 mg via INTRAVENOUS
  Filled 2014-01-15: qty 1500

## 2014-01-15 MED ORDER — HEPARIN SOD (PORK) LOCK FLUSH 100 UNIT/ML IV SOLN
INTRAVENOUS | Status: AC
  Filled 2014-01-15: qty 5

## 2014-01-15 MED ORDER — FENTANYL CITRATE 0.05 MG/ML IJ SOLN
INTRAMUSCULAR | Status: AC
  Filled 2014-01-15: qty 6

## 2014-01-15 MED ORDER — LIDOCAINE HCL 1 % IJ SOLN
INTRAMUSCULAR | Status: AC
  Filled 2014-01-15: qty 20

## 2014-01-15 NOTE — Telephone Encounter (Signed)
Confirmed with sister that pt has appt at 3 for echo - necessary for chemotherapy tomorrow.  She voiced understanding.

## 2014-01-15 NOTE — Procedures (Signed)
Interventional Radiology Procedure Note  Procedure: CT guided aspirate and core biopsy of right posterior iliac bone Complications: None Recommendations: - Bedrest supine x 3 hrs - Follow biopsy results - To IR for PORT placement Signed,  Dulcy Fanny. Earleen Newport, DO

## 2014-01-15 NOTE — Sedation Documentation (Signed)
Pt being moved to IR for port placement

## 2014-01-15 NOTE — Discharge Instructions (Signed)
Bone Biopsy, Needle, Care After Read the instructions outlined below and refer to this sheet in the next few weeks. These discharge instructions provide you with general information on caring for yourself after you leave the hospital. Your caregiver may also give you specific instructions. While your treatment has been planned according to the most current medical practices available, unavoidable complications sometimes occur. If you have any problems or questions after discharge, call your caregiver. Finding out the results of your test Not all test results are available during your visit. If your test results are not back during the visit, make an appointment with your caregiver to find out the results. Do not assume everything is normal if you have not heard from your caregiver or the medical facility. It is important for you to follow up on all of your test results.  SEEK MEDICAL CARE IF:   You have redness, swelling, or increasing pain at the site of the biopsy.  You have pus coming from the biopsy site.  You have drainage from the biopsy site lasting longer than 1 day.  You notice a bad smell coming from the biopsy site or dressing.  You develop persistent nausea or vomiting. SEEK IMMEDIATE MEDICAL CARE IF:  You have a fever.  You develop a rash.  You have difficulty breathing.  You develop any reaction or side effects to medicines given. Document Released: 07/15/2004 Document Revised: 10/16/2012 Document Reviewed: 06/02/2008 [facility] Patient Information 2015 [place], [place]. This information is not intended to replace advice given to you by your health care provider. Make sure you discuss any questions you have with your health care provider.  Implanted Port Insertion, Care After Refer to this sheet in the next few weeks. These instructions provide you with information on caring for yourself after your procedure. Your health care provider may also give you more specific  instructions. Your treatment has been planned according to current medical practices, but problems sometimes occur. Call your health care provider if you have any problems or questions after your procedure. WHAT TO EXPECT AFTER THE PROCEDURE After your procedure, it is typical to have the following:   Discomfort at the port insertion site. Ice packs to the area will help.  Bruising on the skin over the port. This will subside in 3-4 days. HOME CARE INSTRUCTIONS  After your port is placed, you will get a manufacturer's information card. The card has information about your port. Keep this card with you at all times.   Know what kind of port you have. There are many types of ports available.   Wear a medical alert bracelet in case of an emergency. This can help alert health care workers that you have a port.   The port can stay in for as long as your health care provider believes it is necessary.   A home health care nurse may give medicines and take care of the port.   You or a family member can get special training and directions for giving medicine and taking care of the port at home.  SEEK MEDICAL CARE IF:   Your port does not flush or you are unable to get a blood return.   You have a fever or chills. SEEK IMMEDIATE MEDICAL CARE IF:  You have new fluid or pus coming from your incision.   You notice a bad smell coming from your incision site.   You have swelling, pain, or more redness at the incision or port site.   You  have chest pain or shortness of breath. Document Released: 10/16/2012 Document Revised: 12/31/2012 Document Reviewed: 10/16/2012 [facility] Patient Information 2015 [place], [place]. This information is not intended to replace advice given to you by your health care provider. Make sure you discuss any questions you have with your health care provider.  Conscious Sedation, Adult, Care After Refer to this sheet in the next few weeks. These instructions  provide you with information on caring for yourself after your procedure. Your health care provider may also give you more specific instructions. Your treatment has been planned according to current medical practices, but problems sometimes occur. Call your health care provider if you have any problems or questions after your procedure. WHAT TO EXPECT AFTER THE PROCEDURE  After your procedure:  You may feel sleepy, clumsy, and have poor balance for several hours.  Vomiting may occur if you eat too soon after the procedure. HOME CARE INSTRUCTIONS  Do not participate in any activities where you could become injured for at least 24 hours. Do not:  Drive.  Swim.  Ride a bicycle.  Operate heavy machinery.  Cook.  Use power tools.  Climb ladders.  Work from a high place.  Do not make important decisions or sign legal documents until you are improved.  If you vomit, drink water, juice, or soup when you can drink without vomiting. Make sure you have little or no nausea before eating solid foods.  Only take over-the-counter or prescription medicines for pain, discomfort, or fever as directed by your health care provider.  Make sure you and your family fully understand everything about the medicines given to you, including what side effects may occur.  You should not drink alcohol, take sleeping pills, or take medicines that cause drowsiness for at least 24 hours.  If you smoke, do not smoke without supervision.  If you are feeling better, you may resume normal activities 24 hours after you were sedated.  Keep all appointments with your health care provider. SEEK MEDICAL CARE IF:  Your skin is pale or bluish in color.  You continue to feel nauseous or vomit.  Your pain is getting worse and is not helped by medicine.  You have bleeding or swelling.  You are still sleepy or feeling clumsy after 24 hours. SEEK IMMEDIATE MEDICAL CARE IF:  You develop a rash.  You have  difficulty breathing.  You develop any type of allergic problem.  You have a fever. MAKE SURE YOU:  Understand these instructions.  Will watch your condition.  Will get help right away if you are not doing well or get worse. Document Released: 10/16/2012 Document Reviewed: 10/16/2012 [facility] Patient Information 2015 [place], [place]. This information is not intended to replace advice given to you by your health care provider. Make sure you discuss any questions you have with your health care provider.

## 2014-01-15 NOTE — Procedures (Signed)
Interventional Radiology Procedure Note  Procedure: Placement of a right IJ approach single lumen PowerPort.  Tip is positioned at the superior cavoatrial junction and catheter is ready for immediate use.  Complications: No immediate Recommendations:  - Ok to shower tomorrow - Do not submerge for 7 days - Routine line care   Signed,  Khilee Hendricksen S. Yusuke Beza, DO    

## 2014-01-15 NOTE — H&P (Signed)
Chief Complaint: "I'm having a bone marrow biopsy and port a cath done"  Referring Physician(s): Gudena,Vinay K  History of Present Illness: Rachael West is a 59 y.o. female with history of recently diagnosed large B cell lymphoma who presents today for CT guided bone marrow biopsy and port a cath placement.  Past Medical History  Diagnosis Date  . Hypertension   . Asthma   . GERD (gastroesophageal reflux disease)   . Arthritis   . Anemia   . Anxiety   . Hepatitis c  . Cancer 12-2013    aggressive lymphoma    Past Surgical History  Procedure Laterality Date  . Appendectomy    . Trigger finger release    . Esophagogastroduodenoscopy  01/17/2011    Procedure: ESOPHAGOGASTRODUODENOSCOPY (EGD);  Surgeon: Winfield Cunas., MD;  Location: [facility];  Service: Endoscopy;  Laterality: N/A;  . Colonoscopy  01/17/2011    Procedure: COLONOSCOPY;  Surgeon: Winfield Cunas., MD;  Location: [facility];  Service: Endoscopy;  Laterality: N/A;    Allergies: Latex; Penicillins; Codeine; and Sulfa antibiotics  Medications: Prior to Admission medications   Medication Sig Start Date End Date Taking? Authorizing Provider  albuterol (PROVENTIL) (2.5 MG/3ML) 0.083% nebulizer solution Take 2.5 mg by nebulization every 6 (six) hours as needed for wheezing.     Historical Provider, MD  allopurinol (ZYLOPRIM) 300 MG tablet Take 1 tablet (300 mg total) by mouth daily. 01/12/14   Rulon Eisenmenger, MD  amitriptyline (ELAVIL) 25 MG tablet Take 25 mg by mouth at bedtime.    Historical Provider, MD  amLODipine (NORVASC) 5 MG tablet Take 5 mg by mouth every morning.     Historical Provider, MD  bimatoprost (LUMIGAN) 0.01 % SOLN Place 1 drop into both eyes at bedtime.    Historical Provider, MD  fluticasone-salmeterol (ADVAIR HFA) 115-21 MCG/ACT inhaler Inhale 2 puffs into the lungs 2 (two) times daily.      Historical Provider, MD  hydrOXYzine (VISTARIL) 50 MG capsule Take 50 mg by mouth  at bedtime.    Historical Provider, MD  lidocaine-prilocaine (EMLA) cream Apply to affected area once 01/12/14   Rulon Eisenmenger, MD  lisinopril (PRINIVIL,ZESTRIL) 5 MG tablet Take 5 mg by mouth every morning.     Historical Provider, MD  LORazepam (ATIVAN) 0.5 MG tablet Take 1 tablet (0.5 mg total) by mouth every 6 (six) hours as needed (Nausea or vomiting). 01/12/14   Rulon Eisenmenger, MD  meloxicam (MOBIC) 15 MG tablet Take 15 mg by mouth daily.      Historical Provider, MD  ondansetron (ZOFRAN) 8 MG tablet Take 1 tablet (8 mg total) by mouth 2 (two) times daily. Start the day after chemo for 3 days. Then as needed for nausea or vomiting. 01/12/14   Rulon Eisenmenger, MD  paliperidone (INVEGA) 6 MG 24 hr tablet Take 6 mg by mouth every morning.    Historical Provider, MD  pantoprazole (PROTONIX) 40 MG tablet Take 40 mg by mouth daily.      Historical Provider, MD  prazosin (MINIPRESS) 1 MG capsule Take 1 mg by mouth at bedtime.    Historical Provider, MD  predniSONE (DELTASONE) 20 MG tablet Take 3 tablets (60 mg total) by mouth daily. Take on days 1-5 of chemotherapy. 01/12/14   Rulon Eisenmenger, MD  prochlorperazine (COMPAZINE) 10 MG tablet Take 1 tablet (10 mg total) by mouth every 6 (six) hours as needed (Nausea or vomiting). 01/12/14  Rulon Eisenmenger, MD  sertraline (ZOLOFT) 100 MG tablet Take 200 mg by mouth every morning.    Historical Provider, MD  traMADol (ULTRAM) 50 MG tablet TAKE 1 TABLET BY MOUTH THREE TIMES DAILY 12/07/13   Robyn Haber, MD  traMADol (ULTRAM) 50 MG tablet Take 50 mg by mouth 3 (three) times daily.    Historical Provider, MD  traZODone (DESYREL) 100 MG tablet Take 100 mg by mouth at bedtime.      Historical Provider, MD  triamcinolone (NASACORT) 55 MCG/ACT nasal inhaler Place 2 sprays into the nose daily.      Historical Provider, MD    No family history on file.  History   Social History  . Marital Status: Divorced    Spouse Name: N/A    Number of Children: N/A  . Years of  Education: N/A   Social History Main Topics  . Smoking status: Former Research scientist (life sciences)  . Smokeless tobacco: Not on file  . Alcohol Use: No  . Drug Use: No  . Sexual Activity: Not on file   Other Topics Concern  . Not on file   Social History Narrative        Review of Systems   Constitutional: Positive for fatigue. Negative for fever.       Night sweats  Respiratory: Positive for shortness of breath.   Cardiovascular: Negative for chest pain.  Gastrointestinal: Negative for nausea, vomiting, abdominal pain and blood in stool.  Genitourinary: Negative for dysuria and hematuria.  Musculoskeletal: Negative for back pain.  Neurological: Positive for headaches.  Hematological: Does not bruise/bleed easily.    Vital Signs: BP 121/68  HR 100  R 20  TEMP 98.2   Physical Exam  Constitutional: She is oriented to person, place, and time. She appears well-developed and well-nourished.  Cardiovascular: Normal rate and regular rhythm.   Pulmonary/Chest: Effort normal.  Distant BS bilat  Abdominal: Soft. Bowel sounds are normal. There is no tenderness.  obese  Musculoskeletal: Normal range of motion. She exhibits edema.  Lymphadenopathy:    She has cervical adenopathy.  Neurological: She is alert and oriented to person, place, and time.    Imaging: Mm Digital Diagnostic Unilat L  01/06/2014   ADDENDUM REPORT: 01/06/2014 09:02  ADDENDUM: Ultrasound examination of was also performed of the entire left breast. No mass or area of abnormal shadowing or architecture is visualized. No evidence of a primary breast malignancy.   Electronically Signed   By: Lajean Manes M.D.   On: 01/06/2014 09:02   01/06/2014   CLINICAL DATA:  Left axillary masses present for approximately 2 months. Intermittent left breast is milky discharge, more recently noted.  EXAM: DIGITAL DIAGNOSTIC  LEFT MAMMOGRAM WITH CAD  ULTRASOUND LEFT BREAST  COMPARISON:  Prior exams  ACR Breast Density Category b: There are scattered  areas of fibroglandular density.  FINDINGS: Multiple masses lie in the left axilla consistent with enlarged lymph nodes, increased from the most recent prior exam, dated 10/21/2013, and new from the exam dated 09/18/2012.  No left breast masses or distortion. Suspensory ligaments are thickened when compared to prior exams consistent with mild diffuse edema.  Mammographic images were processed with CAD.  On physical exam, left axilla swollen with multiple soft mass is palpable.  Ultrasound is performed, showing multiple axillary masses. The most visible is the most superficial. It measures approximately 6 cm in greatest dimension. It is hypoechoic and circumscribed.  IMPRESSION: Enlarged left axillary lymph nodes, which have significantly increased in  size from the most recent prior imaging, dated just over 2 months previously. This rapid change supports lymphoma as the diagnosis. No mammographic breast mass. A breast malignancy primary is felt unlikely.  RECOMMENDATION: Ultrasound-guided core needle biopsy of 1 of the enlarged left axillary lymph nodes.  I have discussed the findings and recommendations with the patient. Results were also provided in writing at the conclusion of the visit. If applicable, a reminder letter will be sent to the patient regarding the next appointment.  BI-RADS CATEGORY  4: Suspicious.  Electronically Signed: By: Amie Portland M.D. On: 01/05/2014 10:01   US Breast Complete Uni Left Inc Axilla  01/06/2014   ADDENDUM REPORT: 01/06/2014 09:02  ADDENDUM: Ultrasound examination of was also performed of the entire left breast. No mass or area of abnormal shadowing or architecture is visualized. No evidence of a primary breast malignancy.   Electronically Signed   By: Amie Portland M.D.   On: 01/06/2014 09:02   01/06/2014   CLINICAL DATA:  Left axillary masses present for approximately 2 months. Intermittent left breast is milky discharge, more recently noted.  EXAM: DIGITAL DIAGNOSTIC   LEFT MAMMOGRAM WITH CAD  ULTRASOUND LEFT BREAST  COMPARISON:  Prior exams  ACR Breast Density Category b: There are scattered areas of fibroglandular density.  FINDINGS: Multiple masses lie in the left axilla consistent with enlarged lymph nodes, increased from the most recent prior exam, dated 10/21/2013, and new from the exam dated 09/18/2012.  No left breast masses or distortion. Suspensory ligaments are thickened when compared to prior exams consistent with mild diffuse edema.  Mammographic images were processed with CAD.  On physical exam, left axilla swollen with multiple soft mass is palpable.  Ultrasound is performed, showing multiple axillary masses. The most visible is the most superficial. It measures approximately 6 cm in greatest dimension. It is hypoechoic and circumscribed.  IMPRESSION: Enlarged left axillary lymph nodes, which have significantly increased in size from the most recent prior imaging, dated just over 2 months previously. This rapid change supports lymphoma as the diagnosis. No mammographic breast mass. A breast malignancy primary is felt unlikely.  RECOMMENDATION: Ultrasound-guided core needle biopsy of 1 of the enlarged left axillary lymph nodes.  I have discussed the findings and recommendations with the patient. Results were also provided in writing at the conclusion of the visit. If applicable, a reminder letter will be sent to the patient regarding the next appointment.  BI-RADS CATEGORY  4: Suspicious.  Electronically Signed: By: Amie Portland M.D. On: 01/05/2014 10:01   [place] Lt Breast Bx W Loc Dev 1st Lesion Img Bx Spec US Guide  01/07/2014   ADDENDUM REPORT: 01/07/2014 14:21  ADDENDUM: Patient's pathology has returned. Patient has a high-grade, large B-cell lymphoma, non-Hodgkin's type. This is concordant with the multiple enlarged left axillary lymph nodes. Patient will be contacted by the [facility] cancer center for an oncology appointment.  I contacted this patient today,  01/07/2014, with the results. I explained the results and notify her that she would be contacted by the Cancer Center for and oncological appointment. She is doing well post biopsy with some initial bleeding, but no additional bleeding.   Electronically Signed   By: Amie Portland M.D.   On: 01/07/2014 14:21   01/07/2014   CLINICAL DATA:  Patient has enlarged left axillary lymph nodes which has shown a significant increase in size over the last several months.  EXAM: ULTRASOUND GUIDED CORE NEEDLE BIOPSY OF A LEFT AXILLARY NODE  COMPARISON:  Previous exams.  FINDINGS: I met with the patient and we discussed the procedure of ultrasound-guided biopsy, including benefits and alternatives. We discussed the high likelihood of a successful procedure. We discussed the risks of the procedure, including infection, bleeding, tissue injury, clip migration, and inadequate sampling. Informed written consent was given. The usual time-out protocol was performed immediately prior to the procedure.  Using sterile technique and 2% Lidocaine as local anesthetic, under direct ultrasound visualization, a 14 gauge vacuum assist, spring-loaded device was used to perform biopsy of a large left axillary lymph node using a medial approach. At the conclusion of the procedure a ribbon shaped tissue marker clip was deployed into the biopsy cavity.  IMPRESSION: Ultrasound guided biopsy of an enlarged left axillary lymph node. Lymphoma is suspected. No apparent complications.  Electronically Signed: By: Lajean Manes M.D. On: 01/05/2014 10:02    Labs:  CBC:  Recent Labs  01/12/14 1425 01/15/14 0730  WBC 4.9 4.7  HGB 11.4* 10.6*  HCT 35.6 34.6*  PLT 176 182    COAGS:  Recent Labs  01/12/14 1425 01/15/14 0730  INR 1.14 1.13  APTT 37 37    BMP:  Recent Labs  01/12/14 1425  NA 138  K 4.4  CO2 26  GLUCOSE 101  BUN 10.1  CALCIUM 9.1  CREATININE 0.9    LIVER FUNCTION TESTS:  Recent Labs  01/12/14 1425    BILITOT 0.41  AST 38*  ALT 29  ALKPHOS 64  PROT 7.9  ALBUMIN 3.4*    TUMOR MARKERS: No results for input(s): AFPTM, CEA, CA199, CHROMGRNA in the last 8760 hours.  Assessment and Plan: Rachael West is a 59 y.o. female with history of recently diagnosed large B cell lymphoma who presents today for CT guided bone marrow biopsy and port a cath placement. Details/risks of procedures d/w pt/sister with their understanding and consent.      Signed: Autumn Messing 01/15/2014, 8:29 AM

## 2014-01-15 NOTE — Progress Notes (Signed)
  Echocardiogram 2D Echocardiogram has been performed.  Rachael West FRANCES 01/15/2014, 3:39 PM

## 2014-01-15 NOTE — Discharge Instructions (Signed)
Conscious Sedation, Adult, Care After Refer to this sheet in the next few weeks. These instructions provide you with information on caring for yourself after your procedure. Your health care provider may also give you more specific instructions. Your treatment has been planned according to current medical practices, but problems sometimes occur. Call your health care provider if you have any problems or questions after your procedure. WHAT TO EXPECT AFTER THE PROCEDURE  After your procedure:  You may feel sleepy, clumsy, and have poor balance for several hours.  Vomiting may occur if you eat too soon after the procedure. HOME CARE INSTRUCTIONS  Do not participate in any activities where you could become injured for at least 24 hours. Do not:  Drive.  Swim.  Ride a bicycle.  Operate heavy machinery.  Cook.  Use power tools.  Climb ladders.  Work from a high place.  Do not make important decisions or sign legal documents until you are improved.  Implanted Port Insertion, Care After Refer to this sheet in the next few weeks. These instructions provide you with information on caring for yourself after your procedure. Your health care provider may also give you more specific instructions. Your treatment has been planned according to current medical practices, but problems sometimes occur. Call your health care provider if you have any problems or questions after your procedure. WHAT TO EXPECT AFTER THE PROCEDURE After your procedure, it is typical to have the following:   Discomfort at the port insertion site. Ice packs to the area will help.  Bruising on the skin over the port. This will subside in 3-4 days. HOME CARE INSTRUCTIONS  After your port is placed, you will get a manufacturer's information card. The card has information about your port. Keep this card with you at all times.   Know what kind of port you have. There are many types of ports available.   Wear a medical  alert bracelet in case of an emergency. This can help alert health care workers that you have a port.   The port can stay in for as long as your health care provider believes it is necessary.   A home health care nurse may give medicines and take care of the port.   You or a family member can get special training and directions for giving medicine and taking care of the port at home.  SEEK MEDICAL CARE IF:   Your port does not flush or you are unable to get a blood return.   You have a fever or chills. SEEK IMMEDIATE MEDICAL CARE IF:  You have new fluid or pus coming from your incision.   You notice a bad smell coming from your incision site.   You have swelling, pain, or more redness at the incision or port site.   You have chest pain or shortness of breath. Document Released: 10/16/2012 Document Revised: 12/31/2012 Document Reviewed: 10/16/2012 [facility] Patient Information 2015 [place], [place]. This information is not intended to replace advice given to you by your health care provider. Make sure you discuss any questions you have with your health care provider.   If you vomit, drink water, juice, or soup when you can drink without vomiting. Make sure you have little or no nausea before eating solid foods.  Only take over-the-counter or prescription medicines for pain, discomfort, or fever as directed by your health care provider.  Make sure you and your family fully understand everything about the medicines given to you, including what  side effects may occur.  You should not drink alcohol, take sleeping pills, or take medicines that cause drowsiness for at least 24 hours.  If you smoke, do not smoke without supervision.  If you are feeling better, you may resume normal activities 24 hours after you were sedated.  Keep all appointments with your health care provider. SEEK MEDICAL CARE IF:  Your skin is pale or bluish in color.  You continue to feel nauseous or  vomit.  Your pain is getting worse and is not helped by medicine.  You have bleeding or swelling.  You are still sleepy or feeling clumsy after 24 hours. SEEK IMMEDIATE MEDICAL CARE IF:  You develop a rash.  You have difficulty breathing.  You develop any type of allergic problem.  You have a fever. MAKE SURE YOU:  Understand these instructions.  Will watch your condition.  Will get help right away if you are not doing well or get worse. Document Released: 10/16/2012 Document Reviewed: 10/16/2012 [facility] Patient Information 2015 [place], [place]. This information is not intended to replace advice given to you by your health care provider. Make sure you discuss any questions you have with your health care provider.  Bone Biopsy, Needle, Care After Read the instructions outlined below and refer to this sheet in the next few weeks. These discharge instructions provide you with general information on caring for yourself after you leave the hospital. Your caregiver may also give you specific instructions. While your treatment has been planned according to the most current medical practices available, unavoidable complications sometimes occur. If you have any problems or questions after discharge, call your caregiver. Finding out the results of your test Not all test results are available during your visit. If your test results are not back during the visit, make an appointment with your caregiver to find out the results. Do not assume everything is normal if you have not heard from your caregiver or the medical facility. It is important for you to follow up on all of your test results.  SEEK MEDICAL CARE IF:   You have redness, swelling, or increasing pain at the site of the biopsy.  You have pus coming from the biopsy site.  You have drainage from the biopsy site lasting longer than 1 day.  You notice a bad smell coming from the biopsy site or dressing.  You develop persistent  nausea or vomiting. SEEK IMMEDIATE MEDICAL CARE IF:  You have a fever.  You develop a rash.  You have difficulty breathing.  You develop any reaction or side effects to medicines given. Document Released: 07/15/2004 Document Revised: 10/16/2012 Document Reviewed: 06/02/2008 [facility] Patient Information 2015 [place], [place]. This information is not intended to replace advice given to you by your health care provider. Make sure you discuss any questions you have with your health care provider.

## 2014-01-16 ENCOUNTER — Telehealth: Payer: Self-pay | Admitting: Hematology and Oncology

## 2014-01-16 ENCOUNTER — Other Ambulatory Visit: Payer: Commercial Managed Care - HMO

## 2014-01-16 ENCOUNTER — Ambulatory Visit (HOSPITAL_BASED_OUTPATIENT_CLINIC_OR_DEPARTMENT_OTHER): Payer: Medicare HMO

## 2014-01-16 DIAGNOSIS — C8510 Unspecified B-cell lymphoma, unspecified site: Secondary | ICD-10-CM

## 2014-01-16 DIAGNOSIS — Z5111 Encounter for antineoplastic chemotherapy: Secondary | ICD-10-CM

## 2014-01-16 DIAGNOSIS — Z5112 Encounter for antineoplastic immunotherapy: Secondary | ICD-10-CM

## 2014-01-16 MED ORDER — VINCRISTINE SULFATE CHEMO INJECTION 1 MG/ML
2.0000 mg | Freq: Once | INTRAVENOUS | Status: AC
  Administered 2014-01-16: 2 mg via INTRAVENOUS
  Filled 2014-01-16: qty 2

## 2014-01-16 MED ORDER — DOXORUBICIN HCL CHEMO IV INJECTION 2 MG/ML
50.0000 mg/m2 | Freq: Once | INTRAVENOUS | Status: AC
  Administered 2014-01-16: 110 mg via INTRAVENOUS
  Filled 2014-01-16: qty 55

## 2014-01-16 MED ORDER — SODIUM CHLORIDE 0.9 % IV SOLN
750.0000 mg/m2 | Freq: Once | INTRAVENOUS | Status: AC
  Administered 2014-01-16: 1640 mg via INTRAVENOUS
  Filled 2014-01-16: qty 82

## 2014-01-16 MED ORDER — PALONOSETRON HCL INJECTION 0.25 MG/5ML
0.2500 mg | Freq: Once | INTRAVENOUS | Status: AC
  Administered 2014-01-16: 0.25 mg via INTRAVENOUS

## 2014-01-16 MED ORDER — SODIUM CHLORIDE 0.9 % IJ SOLN
10.0000 mL | INTRAMUSCULAR | Status: DC | PRN
  Administered 2014-01-16: 10 mL
  Filled 2014-01-16: qty 10

## 2014-01-16 MED ORDER — SODIUM CHLORIDE 0.9 % IV SOLN
375.0000 mg/m2 | Freq: Once | INTRAVENOUS | Status: AC
  Administered 2014-01-16: 800 mg via INTRAVENOUS
  Filled 2014-01-16: qty 80

## 2014-01-16 MED ORDER — DEXAMETHASONE SODIUM PHOSPHATE 10 MG/ML IJ SOLN
INTRAMUSCULAR | Status: AC
Start: 2014-01-16 — End: 2014-01-16
  Filled 2014-01-16: qty 1

## 2014-01-16 MED ORDER — HEPARIN SOD (PORK) LOCK FLUSH 100 UNIT/ML IV SOLN
500.0000 [IU] | Freq: Once | INTRAVENOUS | Status: AC | PRN
  Administered 2014-01-16: 500 [IU]
  Filled 2014-01-16: qty 5

## 2014-01-16 MED ORDER — PALONOSETRON HCL INJECTION 0.25 MG/5ML
INTRAVENOUS | Status: AC
  Filled 2014-01-16: qty 5

## 2014-01-16 MED ORDER — DIPHENHYDRAMINE HCL 25 MG PO CAPS
50.0000 mg | ORAL_CAPSULE | Freq: Once | ORAL | Status: AC
  Administered 2014-01-16: 50 mg via ORAL

## 2014-01-16 MED ORDER — SODIUM CHLORIDE 0.9 % IV SOLN
Freq: Once | INTRAVENOUS | Status: AC
  Administered 2014-01-16: 12:00:00 via INTRAVENOUS

## 2014-01-16 MED ORDER — DIPHENHYDRAMINE HCL 25 MG PO CAPS
ORAL_CAPSULE | ORAL | Status: AC
Start: 2014-01-16 — End: 2014-01-16
  Filled 2014-01-16: qty 2

## 2014-01-16 MED ORDER — DEXAMETHASONE SODIUM PHOSPHATE 10 MG/ML IJ SOLN
10.0000 mg | Freq: Once | INTRAMUSCULAR | Status: AC
  Administered 2014-01-16: 10 mg via INTRAVENOUS

## 2014-01-16 MED ORDER — ACETAMINOPHEN 325 MG PO TABS
ORAL_TABLET | ORAL | Status: AC
  Filled 2014-01-16: qty 2

## 2014-01-16 MED ORDER — ACETAMINOPHEN 325 MG PO TABS
650.0000 mg | ORAL_TABLET | Freq: Once | ORAL | Status: AC
  Administered 2014-01-16: 650 mg via ORAL

## 2014-01-16 NOTE — Progress Notes (Signed)
Due to insurance coverage, pt's neulasta injection  Will be moved to Monday at 10am.  HL

## 2014-01-16 NOTE — Telephone Encounter (Signed)
, °

## 2014-01-16 NOTE — Patient Instructions (Signed)
[facility] Discharge Instructions for Patients Receiving Chemotherapy  Today you received the following chemotherapy agents Rituxan, Cytoxan, Adriamycin, Vincristine  To help prevent nausea and vomiting after your treatment, we encourage you to take your nausea medication as directed.   If you develop nausea and vomiting that is not controlled by your nausea medication, call the clinic.   BELOW ARE SYMPTOMS THAT SHOULD BE REPORTED IMMEDIATELY:  *FEVER GREATER THAN 100.5 F  *CHILLS WITH OR WITHOUT FEVER  NAUSEA AND VOMITING THAT IS NOT CONTROLLED WITH YOUR NAUSEA MEDICATION  *UNUSUAL SHORTNESS OF BREATH  *UNUSUAL BRUISING OR BLEEDING  TENDERNESS IN MOUTH AND THROAT WITH OR WITHOUT PRESENCE OF ULCERS  *URINARY PROBLEMS  *BOWEL PROBLEMS  UNUSUAL RASH Items with * indicate a potential emergency and should be followed up as soon as possible.  Feel free to call the clinic you have any questions or concerns. The clinic phone number is [phone].

## 2014-01-17 ENCOUNTER — Ambulatory Visit: Payer: Commercial Managed Care - HMO

## 2014-01-19 ENCOUNTER — Ambulatory Visit (HOSPITAL_BASED_OUTPATIENT_CLINIC_OR_DEPARTMENT_OTHER): Payer: Medicare HMO

## 2014-01-19 ENCOUNTER — Telehealth: Payer: Self-pay | Admitting: *Deleted

## 2014-01-19 DIAGNOSIS — Z5189 Encounter for other specified aftercare: Secondary | ICD-10-CM

## 2014-01-19 DIAGNOSIS — C8510 Unspecified B-cell lymphoma, unspecified site: Secondary | ICD-10-CM

## 2014-01-19 MED ORDER — PEGFILGRASTIM INJECTION 6 MG/0.6ML ~~LOC~~
6.0000 mg | PREFILLED_SYRINGE | Freq: Once | SUBCUTANEOUS | Status: AC
  Administered 2014-01-19: 6 mg via SUBCUTANEOUS
  Filled 2014-01-19: qty 0.6

## 2014-01-19 NOTE — Patient Instructions (Signed)
Pegfilgrastim injection What is this medicine? PEGFILGRASTIM (peg fil GRA stim) is a long-acting granulocyte colony-stimulating factor that stimulates the growth of neutrophils, a type of white blood cell important in the body's fight against infection. It is used to reduce the incidence of fever and infection in patients with certain types of cancer who are receiving chemotherapy that affects the bone marrow. This medicine may be used for other purposes; ask your health care provider or pharmacist if you have questions. COMMON BRAND NAME(S): Neulasta What should I tell my health care provider before I take this medicine? They need to know if you have any of these conditions: -latex allergy -ongoing radiation therapy -sickle cell disease -skin reactions to acrylic adhesives (On-Body Injector only) -an unusual or allergic reaction to pegfilgrastim, filgrastim, other medicines, foods, dyes, or preservatives -pregnant or trying to get pregnant -breast-feeding How should I use this medicine? This medicine is for injection under the skin. If you get this medicine at home, you will be taught how to prepare and give the pre-filled syringe or how to use the On-body Injector. Refer to the patient Instructions for Use for detailed instructions. Use exactly as directed. Take your medicine at regular intervals. Do not take your medicine more often than directed. It is important that you put your used needles and syringes in a special sharps container. Do not put them in a trash can. If you do not have a sharps container, call your pharmacist or healthcare provider to get one. Talk to your pediatrician regarding the use of this medicine in children. Special care may be needed. Overdosage: If you think you have taken too much of this medicine contact a poison control center or emergency room at once. NOTE: This medicine is only for you. Do not share this medicine with others. What if I miss a dose? It is  important not to miss your dose. Call your doctor or health care professional if you miss your dose. If you miss a dose due to an On-body Injector failure or leakage, a new dose should be administered as soon as possible using a single prefilled syringe for manual use. What may interact with this medicine? Interactions have not been studied. Give your health care provider a list of all the medicines, herbs, non-prescription drugs, or dietary supplements you use. Also tell them if you smoke, drink alcohol, or use illegal drugs. Some items may interact with your medicine. This list may not describe all possible interactions. Give your health care provider a list of all the medicines, herbs, non-prescription drugs, or dietary supplements you use. Also tell them if you smoke, drink alcohol, or use illegal drugs. Some items may interact with your medicine. What should I watch for while using this medicine? You may need blood work done while you are taking this medicine. If you are going to need a MRI, CT scan, or other procedure, tell your doctor that you are using this medicine (On-Body Injector only). What side effects may I notice from receiving this medicine? Side effects that you should report to your doctor or health care professional as soon as possible: -allergic reactions like skin rash, itching or hives, swelling of the face, lips, or tongue -dizziness -fever -pain, redness, or irritation at site where injected -pinpoint red spots on the skin -shortness of breath or breathing problems -stomach or side pain, or pain at the shoulder -swelling -tiredness -trouble passing urine Side effects that usually do not require medical attention (report to your doctor   or health care professional if they continue or are bothersome): -bone pain -muscle pain This list may not describe all possible side effects. Call your doctor for medical advice about side effects. You may report side effects to FDA at  1-800-FDA-1088. Where should I keep my medicine? Keep out of the reach of children. Store pre-filled syringes in a refrigerator between 2 and 8 degrees C (36 and 46 degrees F). Do not freeze. Keep in carton to protect from light. Throw away this medicine if it is left out of the refrigerator for more than 48 hours. Throw away any unused medicine after the expiration date. NOTE: This sheet is a summary. It may not cover all possible information. If you have questions about this medicine, talk to your doctor, pharmacist, or health care provider.  2015, [employer]. (2013-03-27 16:14:05)  

## 2014-01-19 NOTE — Telephone Encounter (Signed)
-----   Message from Braulio Bosch, RN sent at 01/16/2014  5:38 PM EST ----- Regarding: Chemo Follow up Call First time [facility]. Dr. Lindi Adie.

## 2014-01-19 NOTE — Telephone Encounter (Signed)
Called [facility] for chemotherapy F/U.  Patient is doing well.  Denies n/v.  Denies any new side effects or symptoms.  Bladder is functioning well but does not recall when last BM was.  "I guess I better take some Miralax."  Advised to use the Miralax daily or at drink 4 oz of what is mixed daily if her concern id diarrhea.  Eating and drinking well and I instructed to drink 64 oz minimum daily or at least the day before, of and after treatment.  Denies questions at this time and encouraged to call if needed.  Reviewed how to call after hours in the case of an emergency.

## 2014-01-21 ENCOUNTER — Ambulatory Visit (HOSPITAL_COMMUNITY)
Admission: RE | Admit: 2014-01-21 | Discharge: 2014-01-21 | Disposition: A | Discharge status: Home / Self Care | Payer: Medicare HMO | Source: Ambulatory Visit | Attending: Hematology and Oncology | Admitting: Hematology and Oncology

## 2014-01-21 ENCOUNTER — Encounter (HOSPITAL_COMMUNITY): Payer: Self-pay

## 2014-01-21 DIAGNOSIS — C8330 Diffuse large B-cell lymphoma, unspecified site: Secondary | ICD-10-CM | POA: Diagnosis present

## 2014-01-21 DIAGNOSIS — C8510 Unspecified B-cell lymphoma, unspecified site: Secondary | ICD-10-CM | POA: Diagnosis not present

## 2014-01-21 LAB — GLUCOSE, CAPILLARY: GLUCOSE-CAPILLARY: 82 mg/dL (ref 70–99)

## 2014-01-21 MED ORDER — FLUDEOXYGLUCOSE F - 18 (FDG) INJECTION
11.3000 | Freq: Once | INTRAVENOUS | Status: AC | PRN
  Administered 2014-01-21: 11.3 via INTRAVENOUS

## 2014-01-21 MED ORDER — FLUDEOXYGLUCOSE F - 18 (FDG) INJECTION: 11.3000 | Freq: Once | INTRAVENOUS | Status: AC | PRN

## 2014-01-23 ENCOUNTER — Ambulatory Visit (HOSPITAL_BASED_OUTPATIENT_CLINIC_OR_DEPARTMENT_OTHER): Payer: Medicare HMO | Admitting: Hematology and Oncology

## 2014-01-23 ENCOUNTER — Other Ambulatory Visit (HOSPITAL_BASED_OUTPATIENT_CLINIC_OR_DEPARTMENT_OTHER): Payer: Medicare HMO

## 2014-01-23 ENCOUNTER — Telehealth: Payer: Self-pay | Admitting: Hematology and Oncology

## 2014-01-23 VITALS — BP 109/46 | HR 93 | Temp 99.0°F | Resp 18 | Ht 65.5 in | Wt 213.5 lb

## 2014-01-23 DIAGNOSIS — C8510 Unspecified B-cell lymphoma, unspecified site: Secondary | ICD-10-CM

## 2014-01-23 DIAGNOSIS — B1920 Unspecified viral hepatitis C without hepatic coma: Secondary | ICD-10-CM

## 2014-01-23 LAB — COMPREHENSIVE METABOLIC PANEL (CC13)
ALT: 25 U/L (ref 0–55)
AST: 19 U/L (ref 5–34)
Albumin: 3.8 g/dL (ref 3.5–5.0)
Alkaline Phosphatase: 87 U/L (ref 40–150)
Anion Gap: 9 mEq/L (ref 3–11)
BUN: 24.1 mg/dL (ref 7.0–26.0)
CO2: 28 mEq/L (ref 22–29)
Calcium: 9.1 mg/dL (ref 8.4–10.4)
Chloride: 103 mEq/L (ref 98–109)
Creatinine: 1 mg/dL (ref 0.6–1.1)
EGFR: 73 mL/min/{1.73_m2} — ABNORMAL LOW (ref >90)
Glucose: 87 mg/dl (ref 70–140)
Potassium: 4.1 mEq/L (ref 3.5–5.1)
Sodium: 140 mEq/L (ref 136–145)
Total Bilirubin: 0.62 mg/dL (ref 0.20–1.20)
Total Protein: 8.2 g/dL (ref 6.4–8.3)

## 2014-01-23 LAB — CBC WITH DIFFERENTIAL/PLATELET
BASO%: 0.3 % (ref 0.0–2.0)
Basophils Absolute: 0 10*3/uL (ref 0.0–0.1)
EOS%: 0.3 % (ref 0.0–7.0)
Eosinophils Absolute: 0 10*3/uL (ref 0.0–0.5)
HCT: 36.1 % (ref 34.8–46.6)
HGB: 11.6 g/dL (ref 11.6–15.9)
LYMPH%: 23.7 % (ref 14.0–49.7)
MCH: 25.1 pg (ref 25.1–34.0)
MCHC: 32.1 g/dL (ref 31.5–36.0)
MCV: 78 fL — ABNORMAL LOW (ref 79.5–101.0)
MONO#: 0.2 10*3/uL (ref 0.1–0.9)
MONO%: 2.6 % (ref 0.0–14.0)
NEUT#: 6.4 10*3/uL (ref 1.5–6.5)
NEUT%: 73.1 % (ref 38.4–76.8)
Platelets: 145 10*3/uL (ref 145–400)
RBC: 4.63 10*6/uL (ref 3.70–5.45)
RDW: 14.4 % (ref 11.2–14.5)
WBC: 8.8 10*3/uL (ref 3.9–10.3)
lymph#: 2.1 10*3/uL (ref 0.9–3.3)

## 2014-01-23 NOTE — Addendum Note (Signed)
Addended by: Prentiss Bells on: 01/23/2014 10:19 AM   Modules accepted: Orders

## 2014-01-23 NOTE — Assessment & Plan Note (Addendum)
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 Today is cycle 1 day 8 toxicity evaluation  Chemotherapy toxicities: Patient did not have any side effects to chemotherapy. Denies any nausea vomiting. Blood counts did not show any neutropenia, previous anemia has also improved. Awaiting liver function and kidney function tests.  Monitoring closely for chemotherapy toxicities Continue with tumor lysis prophylaxis with allopurinol  Hepatitis C: I referred her to infectious disease for evaluation and treatment of hepatitis C. Patient fully understands the risks of reactivation of hepatitis rituximab. We are watching very closely for her liver function tests. I'm seeking assistance with infectious disease for this reason. Unfortunately her lymphoma very aggressive and needs rituximab-based therapy urgently.  Return to clinic in 2 weeks for cycle 2

## 2014-01-23 NOTE — Telephone Encounter (Signed)
per pof to sch pt appt-gave pt copy of sch-sent MW email to sch trmt-pt aware of appts

## 2014-01-23 NOTE — Progress Notes (Signed)
Patient Care Team: [name], MD as PCP - General (Family Medicine)  DIAGNOSIS: Stage IIB large B-cell lymphoma  SUMMARY OF ONCOLOGIC HISTORY:   Large B-cell lymphoma   01/05/2014 Initial Diagnosis Left axillary lymph node biopsy: Aggressive large B-cell lymphoma high-grade without CD10 or CD5 expression, positive for CD20, BCL-2 and BCL 6 and Ki-67 of 100% (DD: DLBCL vs B cell lymphoma unclassified)   01/16/2014 -  Chemotherapy R CHOP chemotherapy 6   01/21/2014 PET scan PET CT scan done after cycle one RCHOP (urgent treatment needed) hypermetabolic bulky left supraclavicular and axillary lymphadenopathy. PET activity in the bone marrow and spleen thought to be related to chemotherapy rather than disease. (Stage 2B)    CHIEF COMPLIANT: Cycle 1 day 8 R CHOP  INTERVAL HISTORY: Rachael West is a next-year-old lady with above-mentioned history of rapidly progressing bulky cervical supraclavicular and axillary lymphadenopathy was started urgently on chemotherapy with our CHOP. She had a bone marrow biopsy which did not reveal any evidence of lymphoma. Flow cytometry was also normal. She had a PET CT scan which showed supraclavicular axillary lymphadenopathy. There was some activity in the bone marrow and spleen but since PET/CT was done 3-4 days after initiation of chemotherapy I do not believe that activity is malignancy. I believe that it is related to treatment. Hence I classified as stage IIB. She received cycle 1 chemotherapy a week ago and is here for toxicity check. She done extremely well from treatment side effects. She denies any nausea vomiting. Denies any neuropathy or fevers or chills.  She had noticed remarkable shrinkage of the cervical supraclavicular and axillary lymph nodes.  REVIEW OF SYSTEMS:   Constitutional: Denies fevers, chills or abnormal weight loss Eyes: Denies blurriness of vision Ears, nose, mouth, throat, and face: Denies mucositis or sore  throat Respiratory: Mild shortness of breath with exertion Cardiovascular: Denies palpitation, chest discomfort or lower extremity swelling Gastrointestinal:  Denies nausea, heartburn or change in bowel habits Skin: Denies abnormal skin rashes Lymphatics: Denies new lymphadenopathy or easy bruising Neurological:Denies numbness, tingling or new weaknesses Behavioral/Psych: Mood is stable, no new changes   All other systems were reviewed with the patient and are negative.  I have reviewed the past medical history, past surgical history, social history and family history with the patient and they are unchanged from previous note.  ALLERGIES:  is allergic to latex; penicillins; codeine; and sulfa antibiotics.  MEDICATIONS:  Current Outpatient Prescriptions  Medication Sig Dispense Refill  . albuterol (PROVENTIL) (2.5 MG/3ML) 0.083% nebulizer solution Take 2.5 mg by nebulization every 6 (six) hours as needed for wheezing.     Marland Kitchen allopurinol (ZYLOPRIM) 300 MG tablet Take 1 tablet (300 mg total) by mouth daily. 30 tablet 1  . amitriptyline (ELAVIL) 25 MG tablet Take 25 mg by mouth at bedtime.    Marland Kitchen amLODipine (NORVASC) 5 MG tablet Take 5 mg by mouth every morning.     . bimatoprost (LUMIGAN) 0.01 % SOLN Place 1 drop into both eyes at bedtime.    . fluticasone-salmeterol (ADVAIR HFA) 115-21 MCG/ACT inhaler Inhale 2 puffs into the lungs 2 (two) times daily.      . hydrOXYzine (VISTARIL) 50 MG capsule Take 50 mg by mouth at bedtime.    . lidocaine-prilocaine (EMLA) cream Apply to affected area once 30 g 3  . lisinopril (PRINIVIL,ZESTRIL) 5 MG tablet Take 5 mg by mouth every morning.     Marland Kitchen LORazepam (ATIVAN) 0.5 MG tablet Take 1 tablet (0.5  mg total) by mouth every 6 (six) hours as needed (Nausea or vomiting). 30 tablet 0  . meloxicam (MOBIC) 15 MG tablet Take 15 mg by mouth daily.      . ondansetron (ZOFRAN) 8 MG tablet Take 1 tablet (8 mg total) by mouth 2 (two) times daily. Start the day after  chemo for 3 days. Then as needed for nausea or vomiting. 30 tablet 1  . paliperidone (INVEGA) 6 MG 24 hr tablet Take 6 mg by mouth every morning.    . pantoprazole (PROTONIX) 40 MG tablet Take 40 mg by mouth daily.      . prazosin (MINIPRESS) 1 MG capsule Take 1 mg by mouth at bedtime.    . predniSONE (DELTASONE) 20 MG tablet Take 3 tablets (60 mg total) by mouth daily. Take on days 1-5 of chemotherapy. 15 tablet 5  . prochlorperazine (COMPAZINE) 10 MG tablet Take 1 tablet (10 mg total) by mouth every 6 (six) hours as needed (Nausea or vomiting). 30 tablet 6  . sertraline (ZOLOFT) 100 MG tablet Take 200 mg by mouth every morning.    . traMADol (ULTRAM) 50 MG tablet TAKE 1 TABLET BY MOUTH THREE TIMES DAILY 90 tablet 2  . traMADol (ULTRAM) 50 MG tablet Take 50 mg by mouth 3 (three) times daily.    . traZODone (DESYREL) 100 MG tablet Take 100 mg by mouth at bedtime.      . triamcinolone (NASACORT) 55 MCG/ACT nasal inhaler Place 2 sprays into the nose daily.       No current facility-administered medications for this visit.    PHYSICAL EXAMINATION: ECOG PERFORMANCE STATUS: 1 - Symptomatic but completely ambulatory  Filed Vitals:   01/23/14 0942  BP: 109/46  Pulse: 93  Temp: 99 F (37.2 C)  Resp: 18   Filed Weights   01/23/14 0942  Weight: 213 lb 8 oz (96.843 kg)    GENERAL:alert, no distress and comfortable SKIN: skin color, texture, turgor are normal, no rashes or significant lesions EYES: normal, Conjunctiva are pink and non-injected, sclera clear OROPHARYNX:no exudate, no erythema and lips, buccal mucosa, and tongue normal  NECK: supple, thyroid normal size, non-tender, without nodularity LYMPH:  Supraclavicular axillary lymphadenopathy has markedly shrunken size and only slightly palpable LUNGS: clear to auscultation and percussion with normal breathing effort HEART: regular rate & rhythm and no murmurs and no lower extremity edema ABDOMEN:abdomen soft, non-tender and normal  bowel sounds Musculoskeletal:no cyanosis of digits and no clubbing  NEURO: alert & oriented x 3 with fluent speech, no focal motor/sensory deficits    LABORATORY DATA:  I have reviewed the data as listed   Chemistry      Component Value Date/Time   NA 140 01/23/2014 0919   NA 139 10/31/2010 2345   K 4.1 01/23/2014 0919   K 4.1 10/31/2010 2345   CL 103 10/31/2010 2345   CO2 28 01/23/2014 0919   CO2 25 07/13/2009 2121   BUN 24.1 01/23/2014 0919   BUN 7 10/31/2010 2345   CREATININE 1.0 01/23/2014 0919   CREATININE 0.80 10/31/2010 2345      Component Value Date/Time   CALCIUM 9.1 01/23/2014 0919   CALCIUM 9.1 07/13/2009 2121   ALKPHOS 87 01/23/2014 0919   ALKPHOS 77 10/31/2010 2335   AST 19 01/23/2014 0919   AST 35 10/31/2010 2335   ALT 25 01/23/2014 0919   ALT 30 10/31/2010 2335   BILITOT 0.62 01/23/2014 0919   BILITOT 0.5 10/31/2010 2335  Lab Results  Component Value Date   WBC 8.8 01/23/2014   HGB 11.6 01/23/2014   HCT 36.1 01/23/2014   MCV 78.0* 01/23/2014   PLT 145 01/23/2014   NEUTROABS 6.4 01/23/2014     RADIOGRAPHIC STUDIES: I have personally reviewed the radiology reports and agreed with their findings. Nm Pet Image Initial (pi) Skull Base To Thigh  01/21/2014   CLINICAL DATA:  Initial treatment strategy for aggressive large B-cell lymphoma.  EXAM: NUCLEAR MEDICINE PET SKULL BASE TO THIGH  TECHNIQUE: 11.4 mCi F-18 FDG was injected intravenously. Full-ring PET imaging was performed from the skull base to thigh after the radiotracer. CT data was obtained and used for attenuation correction and anatomic localization.  FASTING BLOOD GLUCOSE:  Value: 82 Mg/dl  COMPARISON:  No comparison studies available.  FINDINGS: NECK  3.1 x 2.6 cm index left supraclavicular lymph node is associated with other supraclavicular lymphadenopathy. SUV max = 4.7 for the left supraclavicular disease.  CHEST  No hypermetabolic mediastinal or hilar nodes. There are markedly enlarged  and hypermetabolic lymph nodes in the left axilla. The most lateral of the enlarged lymph nodes on CT measures 2.5 x 4.6 cm. This appears to contain small localization clip. Representative SUV max = 11.34 the left axillary nodal disease. No evidence for hypermetabolic mediastinal or hilar lymphadenopathy.  No suspicious pulmonary nodules on the CT scan.  ABDOMEN/PELVIS  No abnormal hypermetabolic activity within the liver, pancreas, or adrenal glands. The spleen, while not enlarged, has diffuse uptake higher than expected. No hypermetabolic lymph nodes in the abdomen or pelvis.  SKELETON  There is diffuse markedly hypermetabolic uptake in the skeleton.  IMPRESSION: 1. Hypermetabolic bulky left supraclavicular and axillary lymphadenopathy, compatible with lymphoma. 2. Diffuse marked FDG accumulation within the marrow space suggests bony involvement of disease. 3. Diffusely increased FDG uptake in the spleen without splenomegaly raises concern for lymphoma involvement of the spleen.   Electronically Signed   By: Misty Stanley M.D.   On: 01/21/2014 12:25     ASSESSMENT & PLAN:  Large B-cell lymphoma Aggressive large B-cell lymphoma: CD5 and 10 negative, CD20 positive BCL 2 and BCL 6 positive: Differential diagnosis aggressive large B-cell lymphoma versus B-cell lymphoma unclassified, Ki-67 100%, bulky lymphadenopathy supraclavicular and axillary lymph nodes, bone marrow negative, stage IIB  Treatment plan: R CHOP chemotherapy started 01/16/2014 plan is to give 6 cycles with the interim staging after 3 cycles. Today is cycle 1 day 8 toxicity evaluation  Chemotherapy toxicities: Patient did not have any side effects to chemotherapy. Denies any nausea vomiting. Blood counts did not show any neutropenia, previous anemia has also improved. Awaiting liver function and kidney function tests.  Monitoring closely for chemotherapy toxicities Continue with tumor lysis prophylaxis with allopurinol  Hepatitis C: I  referred her to infectious disease for evaluation and treatment of hepatitis C. Patient fully understands the risks of reactivation of hepatitis rituximab. We are watching very closely for her liver function tests. I'm seeking assistance with infectious disease for this reason. Unfortunately her lymphoma very aggressive and needs rituximab-based therapy urgently.  Return to clinic in 2 weeks for cycle 2  Orders Placed This Encounter  Procedures  . CBC with Differential    Standing Status: Future     Number of Occurrences:      Standing Expiration Date: 01/23/2015  . Comprehensive metabolic panel (Cmet) - [facility]    Standing Status: Future     Number of Occurrences:      Standing Expiration Date:  01/23/2015   The patient has a good understanding of the overall plan. she agrees with it. She will call with any problems that may develop before her next visit here.   Rulon Eisenmenger, MD

## 2014-01-26 ENCOUNTER — Telehealth: Payer: Self-pay | Admitting: *Deleted

## 2014-01-26 ENCOUNTER — Encounter: Payer: Self-pay | Admitting: Hematology and Oncology

## 2014-01-26 LAB — CHROMOSOME ANALYSIS, BONE MARROW

## 2014-01-26 LAB — TISSUE HYBRIDIZATION (BONE MARROW)-NCBH

## 2014-01-26 NOTE — Telephone Encounter (Signed)
Per staff message and POF I have scheduled appts. Advised scheduler of appts. JMW  

## 2014-01-26 NOTE — Progress Notes (Signed)
Patient will bring in income for 400.00 grant.

## 2014-01-29 ENCOUNTER — Other Ambulatory Visit: Payer: Medicare HMO

## 2014-01-29 DIAGNOSIS — B182 Chronic viral hepatitis C: Secondary | ICD-10-CM

## 2014-01-29 LAB — IRON: IRON: 65 ug/dL (ref 42–145)

## 2014-01-30 ENCOUNTER — Encounter (HOSPITAL_COMMUNITY): Payer: Self-pay

## 2014-01-30 LAB — ANA: Anti Nuclear Antibody(ANA): NEGATIVE

## 2014-01-30 LAB — HEPATITIS A ANTIBODY, TOTAL: Hep A Total Ab: REACTIVE — AB

## 2014-01-30 LAB — HEPATITIS B SURFACE ANTIBODY,QUALITATIVE: Hep B S Ab: NEGATIVE

## 2014-01-30 LAB — HEPATITIS B CORE ANTIBODY, TOTAL: HEP B C TOTAL AB: NONREACTIVE

## 2014-02-04 ENCOUNTER — Encounter: Payer: Self-pay | Admitting: *Deleted

## 2014-02-04 LAB — HEPATITIS C RNA QUANTITATIVE
HCV Quantitative Log: 6.74 {Log} — ABNORMAL HIGH (ref <1.18)
HCV Quantitative: 5495310 IU/mL — ABNORMAL HIGH (ref <15)

## 2014-02-04 NOTE — Progress Notes (Signed)
Received report from [facility] cytogenetic laboratory. Sent to scan.

## 2014-02-05 LAB — HEPATITIS C GENOTYPE

## 2014-02-06 ENCOUNTER — Ambulatory Visit (HOSPITAL_BASED_OUTPATIENT_CLINIC_OR_DEPARTMENT_OTHER): Payer: Medicare HMO

## 2014-02-06 ENCOUNTER — Telehealth: Payer: Self-pay | Admitting: Hematology and Oncology

## 2014-02-06 ENCOUNTER — Ambulatory Visit (HOSPITAL_BASED_OUTPATIENT_CLINIC_OR_DEPARTMENT_OTHER): Payer: Medicare HMO | Admitting: Hematology and Oncology

## 2014-02-06 ENCOUNTER — Other Ambulatory Visit (HOSPITAL_BASED_OUTPATIENT_CLINIC_OR_DEPARTMENT_OTHER): Payer: Medicare HMO

## 2014-02-06 VITALS — BP 122/65 | HR 101 | Temp 98.6°F | Resp 19 | Ht 65.5 in | Wt 221.5 lb

## 2014-02-06 DIAGNOSIS — C8510 Unspecified B-cell lymphoma, unspecified site: Secondary | ICD-10-CM

## 2014-02-06 DIAGNOSIS — Z5111 Encounter for antineoplastic chemotherapy: Secondary | ICD-10-CM

## 2014-02-06 DIAGNOSIS — Z5112 Encounter for antineoplastic immunotherapy: Secondary | ICD-10-CM

## 2014-02-06 DIAGNOSIS — C8334 Diffuse large B-cell lymphoma, lymph nodes of axilla and upper limb: Secondary | ICD-10-CM

## 2014-02-06 DIAGNOSIS — B1710 Acute hepatitis C without hepatic coma: Secondary | ICD-10-CM

## 2014-02-06 LAB — CBC WITH DIFFERENTIAL/PLATELET
BASO%: 1.9 % (ref 0.0–2.0)
Basophils Absolute: 0.1 10*3/uL (ref 0.0–0.1)
EOS%: 0.8 % (ref 0.0–7.0)
Eosinophils Absolute: 0 10*3/uL (ref 0.0–0.5)
HCT: 35.1 % (ref 34.8–46.6)
HGB: 11.1 g/dL — ABNORMAL LOW (ref 11.6–15.9)
LYMPH%: 30.6 % (ref 14.0–49.7)
MCH: 24.7 pg — AB (ref 25.1–34.0)
MCHC: 31.7 g/dL (ref 31.5–36.0)
MCV: 77.8 fL — ABNORMAL LOW (ref 79.5–101.0)
MONO#: 0.5 10*3/uL (ref 0.1–0.9)
MONO%: 12.1 % (ref 0.0–14.0)
NEUT#: 2.3 10*3/uL (ref 1.5–6.5)
NEUT%: 54.6 % (ref 38.4–76.8)
Platelets: 215 10*3/uL (ref 145–400)
RBC: 4.51 10*6/uL (ref 3.70–5.45)
RDW: 14.3 % (ref 11.2–14.5)
WBC: 4.2 10*3/uL (ref 3.9–10.3)
lymph#: 1.3 10*3/uL (ref 0.9–3.3)

## 2014-02-06 LAB — COMPREHENSIVE METABOLIC PANEL (CC13)
ALT: 33 U/L (ref 0–55)
AST: 30 U/L (ref 5–34)
Albumin: 3.2 g/dL — ABNORMAL LOW (ref 3.5–5.0)
Alkaline Phosphatase: 67 U/L (ref 40–150)
Anion Gap: 9 mEq/L (ref 3–11)
BUN: 11.5 mg/dL (ref 7.0–26.0)
CALCIUM: 9 mg/dL (ref 8.4–10.4)
CO2: 23 mEq/L (ref 22–29)
Chloride: 107 mEq/L (ref 98–109)
Creatinine: 0.8 mg/dL (ref 0.6–1.1)
EGFR: 89 mL/min/{1.73_m2} — ABNORMAL LOW (ref >90)
Glucose: 122 mg/dl (ref 70–140)
Potassium: 4 mEq/L (ref 3.5–5.1)
Sodium: 139 mEq/L (ref 136–145)
Total Bilirubin: 0.24 mg/dL (ref 0.20–1.20)
Total Protein: 6.9 g/dL (ref 6.4–8.3)

## 2014-02-06 MED ORDER — DIPHENHYDRAMINE HCL 25 MG PO CAPS
50.0000 mg | ORAL_CAPSULE | Freq: Once | ORAL | Status: AC
  Administered 2014-02-06: 50 mg via ORAL

## 2014-02-06 MED ORDER — DIPHENHYDRAMINE HCL 25 MG PO CAPS
ORAL_CAPSULE | ORAL | Status: AC
  Filled 2014-02-06: qty 2

## 2014-02-06 MED ORDER — HEPARIN SOD (PORK) LOCK FLUSH 100 UNIT/ML IV SOLN
500.0000 [IU] | Freq: Once | INTRAVENOUS | Status: AC | PRN
  Administered 2014-02-06: 500 [IU]
  Filled 2014-02-06: qty 5

## 2014-02-06 MED ORDER — DEXAMETHASONE SODIUM PHOSPHATE 10 MG/ML IJ SOLN
10.0000 mg | Freq: Once | INTRAMUSCULAR | Status: AC
  Administered 2014-02-06: 10 mg via INTRAVENOUS

## 2014-02-06 MED ORDER — ACETAMINOPHEN 325 MG PO TABS
ORAL_TABLET | ORAL | Status: AC
  Filled 2014-02-06: qty 2

## 2014-02-06 MED ORDER — DEXAMETHASONE SODIUM PHOSPHATE 10 MG/ML IJ SOLN
INTRAMUSCULAR | Status: AC
  Filled 2014-02-06: qty 1

## 2014-02-06 MED ORDER — SODIUM CHLORIDE 0.9 % IV SOLN
Freq: Once | INTRAVENOUS | Status: AC
  Administered 2014-02-06: 09:00:00 via INTRAVENOUS

## 2014-02-06 MED ORDER — SODIUM CHLORIDE 0.9 % IV SOLN
375.0000 mg/m2 | Freq: Once | INTRAVENOUS | Status: AC
  Administered 2014-02-06: 800 mg via INTRAVENOUS
  Filled 2014-02-06: qty 80

## 2014-02-06 MED ORDER — PALONOSETRON HCL INJECTION 0.25 MG/5ML
0.2500 mg | Freq: Once | INTRAVENOUS | Status: AC
  Administered 2014-02-06: 0.25 mg via INTRAVENOUS

## 2014-02-06 MED ORDER — SODIUM CHLORIDE 0.9 % IJ SOLN
10.0000 mL | INTRAMUSCULAR | Status: DC | PRN
  Administered 2014-02-06: 10 mL
  Filled 2014-02-06: qty 10

## 2014-02-06 MED ORDER — SODIUM CHLORIDE 0.9 % IV SOLN: 375.0000 mg/m2 | Freq: Once | INTRAVENOUS | Status: DC

## 2014-02-06 MED ORDER — VINCRISTINE SULFATE CHEMO INJECTION 1 MG/ML
2.0000 mg | Freq: Once | INTRAVENOUS | Status: AC
  Administered 2014-02-06: 2 mg via INTRAVENOUS
  Filled 2014-02-06: qty 2

## 2014-02-06 MED ORDER — ACETAMINOPHEN 325 MG PO TABS
650.0000 mg | ORAL_TABLET | Freq: Once | ORAL | Status: AC
  Administered 2014-02-06: 650 mg via ORAL

## 2014-02-06 MED ORDER — PALONOSETRON HCL INJECTION 0.25 MG/5ML
INTRAVENOUS | Status: AC
  Filled 2014-02-06: qty 5

## 2014-02-06 MED ORDER — DOXORUBICIN HCL CHEMO IV INJECTION 2 MG/ML
50.0000 mg/m2 | Freq: Once | INTRAVENOUS | Status: AC
  Administered 2014-02-06: 110 mg via INTRAVENOUS
  Filled 2014-02-06: qty 55

## 2014-02-06 MED ORDER — SODIUM CHLORIDE 0.9 % IV SOLN
750.0000 mg/m2 | Freq: Once | INTRAVENOUS | Status: AC
  Administered 2014-02-06: 1640 mg via INTRAVENOUS
  Filled 2014-02-06: qty 82

## 2014-02-06 NOTE — Patient Instructions (Signed)
[place] Discharge Instructions for Patients Receiving Chemotherapy  Today you received the following chemotherapy agents Adriamycin, Vincristine, Rituxan  And Cytoxan  To help prevent nausea and vomiting after your treatment, we encourage you to take your nausea medication As directed   If you develop nausea and vomiting that is not controlled by your nausea medication, call the clinic.   BELOW ARE SYMPTOMS THAT SHOULD BE REPORTED IMMEDIATELY:  *FEVER GREATER THAN 100.5 F  *CHILLS WITH OR WITHOUT FEVER  NAUSEA AND VOMITING THAT IS NOT CONTROLLED WITH YOUR NAUSEA MEDICATION  *UNUSUAL SHORTNESS OF BREATH  *UNUSUAL BRUISING OR BLEEDING  TENDERNESS IN MOUTH AND THROAT WITH OR WITHOUT PRESENCE OF ULCERS  *URINARY PROBLEMS  *BOWEL PROBLEMS  UNUSUAL RASH Items with * indicate a potential emergency and should be followed up as soon as possible.  Feel free to call the clinic you have any questions or concerns. The clinic phone number is [phone].

## 2014-02-06 NOTE — Progress Notes (Signed)
Patient Care Team: [name], MD as PCP - General (Family Medicine)  DIAGNOSIS: No matching staging information was found for the patient.  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 PET activity in the bone marrow and spleen thought to be related to chemotherapy rather than disease. (Stage 2B)    CHIEF COMPLIANT: Cycle 2 chemotherapy with CHOP  INTERVAL HISTORY: Rachael West is a 59-year-old lady with above-mentioned history of aggressive large B-cell lymphoma currently on chemotherapy with R CHOP. Today is cycle 2. Overall she done very well with the chemotherapy. She denies any new problems or concerns. The swelling from the lymphadenopathy in the neck has markedly improved. She denies any nausea vomiting. Denies any fevers or chills.  REVIEW OF SYSTEMS:   Constitutional: Denies fevers, chills or abnormal weight loss Eyes: Denies blurriness of vision Ears, nose, mouth, throat, and face: Denies mucositis or sore throat Respiratory: Denies cough, dyspnea or wheezes Cardiovascular: Denies palpitation, chest discomfort or lower extremity swelling Gastrointestinal:  Denies nausea, heartburn or change in bowel habits Skin: Denies abnormal skin rashes Lymphatics: Improvement in lymphadenopathy in the neck Neurological:Denies numbness, tingling or new weaknesses Behavioral/Psych: Mood is stable, no new changes  All other systems were reviewed with the patient and are negative.  I have reviewed the past medical history, past  surgical history, social history and family history with the patient and they are unchanged from previous note.  ALLERGIES:  is allergic to latex; penicillins; codeine; and sulfa antibiotics.  MEDICATIONS:  Current Outpatient Prescriptions  Medication Sig Dispense Refill  . albuterol (PROVENTIL) (2.5 MG/3ML) 0.083% nebulizer solution Take 2.5 mg by nebulization every 6 (six) hours as needed for wheezing.     Marland Kitchen allopurinol (ZYLOPRIM) 300 MG tablet Take 1 tablet (300 mg total) by mouth daily. 30 tablet 1  . amitriptyline (ELAVIL) 25 MG tablet Take 25 mg by mouth at bedtime.    Marland Kitchen amLODipine (NORVASC) 5 MG tablet Take 5 mg by mouth every morning.     . bimatoprost (LUMIGAN) 0.01 % SOLN Place 1 drop into both eyes at bedtime.    . fluticasone-salmeterol (ADVAIR HFA) 115-21 MCG/ACT inhaler Inhale 2 puffs into the lungs 2 (two) times daily.      . hydrOXYzine (VISTARIL) 50 MG capsule Take 50 mg by mouth at bedtime.    . lidocaine-prilocaine (EMLA) cream Apply to affected area once 30 g 3  . lisinopril (PRINIVIL,ZESTRIL) 5 MG tablet Take 5 mg by mouth every morning.     Marland Kitchen LORazepam (ATIVAN) 0.5 MG tablet Take 1 tablet (0.5 mg total) by mouth every 6 (six) hours as needed (Nausea or vomiting). 30 tablet 0  . meloxicam (MOBIC) 15 MG tablet Take 15 mg by mouth daily.      . ondansetron (ZOFRAN) 8 MG tablet Take 1 tablet (8 mg total) by mouth 2 (two) times daily. Start the day after chemo for 3 days. Then as needed for nausea or vomiting. 30 tablet 1  . paliperidone (INVEGA) 6 MG 24 hr tablet Take 6 mg by mouth every morning.    Marland Kitchen  pantoprazole (PROTONIX) 40 MG tablet Take 40 mg by mouth daily.      . prazosin (MINIPRESS) 1 MG capsule Take 1 mg by mouth at bedtime.    . predniSONE (DELTASONE) 20 MG tablet Take 3 tablets (60 mg total) by mouth daily. Take on days 1-5 of chemotherapy. 15 tablet 5  . prochlorperazine (COMPAZINE) 10 MG tablet Take 1 tablet (10 mg total) by mouth every 6 (six) hours as needed  (Nausea or vomiting). 30 tablet 6  . sertraline (ZOLOFT) 100 MG tablet Take 200 mg by mouth every morning.    . traMADol (ULTRAM) 50 MG tablet TAKE 1 TABLET BY MOUTH THREE TIMES DAILY 90 tablet 2  . traMADol (ULTRAM) 50 MG tablet Take 50 mg by mouth 3 (three) times daily.    . traZODone (DESYREL) 100 MG tablet Take 100 mg by mouth at bedtime.      . triamcinolone (NASACORT) 55 MCG/ACT nasal inhaler Place 2 sprays into the nose daily.       No current facility-administered medications for this visit.    PHYSICAL EXAMINATION: ECOG PERFORMANCE STATUS: 1 - Symptomatic but completely ambulatory  There were no vitals filed for this visit. There were no vitals filed for this visit.  GENERAL:alert, no distress and comfortable SKIN: skin color, texture, turgor are normal, no rashes or significant lesions EYES: normal, Conjunctiva are pink and non-injected, sclera clear OROPHARYNX:no exudate, no erythema and lips, buccal mucosa, and tongue normal  NECK: supple, thyroid normal size, non-tender, without nodularity LYMPH: Significant reduction in lymphadenopathy in the neck LUNGS: clear to auscultation and percussion with normal breathing effort HEART: regular rate & rhythm and no murmurs and no lower extremity edema ABDOMEN:abdomen soft, non-tender and normal bowel sounds Musculoskeletal:no cyanosis of digits and no clubbing  NEURO: alert & oriented x 3 with fluent speech, no focal motor/sensory deficits  LABORATORY DATA:  I have reviewed the data as listed   Chemistry      Component Value Date/Time   NA 140 01/23/2014 0919   NA 139 10/31/2010 2345   K 4.1 01/23/2014 0919   K 4.1 10/31/2010 2345   CL 103 10/31/2010 2345   CO2 28 01/23/2014 0919   CO2 25 07/13/2009 2121   BUN 24.1 01/23/2014 0919   BUN 7 10/31/2010 2345   CREATININE 1.0 01/23/2014 0919   CREATININE 0.80 10/31/2010 2345      Component Value Date/Time   CALCIUM 9.1 01/23/2014 0919   CALCIUM 9.1 07/13/2009 2121    ALKPHOS 87 01/23/2014 0919   ALKPHOS 77 10/31/2010 2335   AST 19 01/23/2014 0919   AST 35 10/31/2010 2335   ALT 25 01/23/2014 0919   ALT 30 10/31/2010 2335   BILITOT 0.62 01/23/2014 0919   BILITOT 0.5 10/31/2010 2335       Lab Results  Component Value Date   WBC 8.8 01/23/2014   HGB 11.6 01/23/2014   HCT 36.1 01/23/2014   MCV 78.0* 01/23/2014   PLT 145 01/23/2014   NEUTROABS 6.4 01/23/2014    ASSESSMENT & PLAN:  Large B-cell lymphoma Aggressive large B-cell lymphoma: CD5 and 10 negative, CD20 positive BCL 2 and BCL 6 positive: Differential diagnosis aggressive large B-cell lymphoma versus B-cell lymphoma unclassified, Ki-67 100%, bulky lymphadenopathy supraclavicular and axillary lymph nodes, bone marrow negative, stage IIB  Treatment plan: R CHOP chemotherapy started 01/16/2014 plan is to give 6 cycles with the interim staging after 3 cycles.  Today is cycle 2 day 1 RCHOP  Chemotherapy toxicities:  Patient did not have any side effects to chemotherapy. Denies any nausea vomiting. Blood counts did not show any neutropenia, previous anemia has also improved.   Monitoring closely for chemotherapy toxicities Continue with tumor lysis prophylaxis with allopurinol  Hepatitis C: Consulted infectious diseases Return to clinic in 3 weeks for cycle 3. I will see her back with cycle 4    No orders of the defined types were placed in this encounter.   The patient has a good understanding of the overall plan. she agrees with it. She will call with any problems that may develop before her next visit here.   Rulon Eisenmenger, MD

## 2014-02-06 NOTE — Telephone Encounter (Signed)
, °

## 2014-02-06 NOTE — Assessment & Plan Note (Signed)
Aggressive large B-cell lymphoma: CD5 and 10 negative, CD20 positive BCL 2 and BCL 6 positive: Differential diagnosis aggressive large B-cell lymphoma versus B-cell lymphoma unclassified, Ki-67 100%, bulky lymphadenopathy supraclavicular and axillary lymph nodes, bone marrow negative, stage IIB  Treatment plan: R CHOP chemotherapy started 01/16/2014 plan is to give 6 cycles with the interim staging after 3 cycles.  Today is cycle 2 day 1 RCHOP  Chemotherapy toxicities: Patient did not have any side effects to chemotherapy. Denies any nausea vomiting. Blood counts did not show any neutropenia, previous anemia has also improved.   Monitoring closely for chemotherapy toxicities Continue with tumor lysis prophylaxis with allopurinol  Hepatitis C: Consulted infectious diseases Return to clinic in 3 weeks for cycle 3. I will see her back with cycle 4

## 2014-02-09 ENCOUNTER — Ambulatory Visit (HOSPITAL_BASED_OUTPATIENT_CLINIC_OR_DEPARTMENT_OTHER): Payer: Medicare HMO

## 2014-02-09 DIAGNOSIS — C8510 Unspecified B-cell lymphoma, unspecified site: Secondary | ICD-10-CM

## 2014-02-09 DIAGNOSIS — Z5189 Encounter for other specified aftercare: Secondary | ICD-10-CM

## 2014-02-09 MED ORDER — PEGFILGRASTIM INJECTION 6 MG/0.6ML ~~LOC~~
6.0000 mg | PREFILLED_SYRINGE | Freq: Once | SUBCUTANEOUS | Status: AC
  Administered 2014-02-09: 6 mg via SUBCUTANEOUS
  Filled 2014-02-09: qty 0.6

## 2014-02-25 ENCOUNTER — Telehealth: Payer: Self-pay | Admitting: Hematology and Oncology

## 2014-02-25 NOTE — Telephone Encounter (Signed)
, °

## 2014-02-27 ENCOUNTER — Ambulatory Visit (HOSPITAL_BASED_OUTPATIENT_CLINIC_OR_DEPARTMENT_OTHER): Payer: Medicare HMO

## 2014-02-27 ENCOUNTER — Other Ambulatory Visit (HOSPITAL_BASED_OUTPATIENT_CLINIC_OR_DEPARTMENT_OTHER): Payer: Medicare HMO

## 2014-02-27 DIAGNOSIS — Z5112 Encounter for antineoplastic immunotherapy: Secondary | ICD-10-CM

## 2014-02-27 DIAGNOSIS — Z5111 Encounter for antineoplastic chemotherapy: Secondary | ICD-10-CM

## 2014-02-27 DIAGNOSIS — C8510 Unspecified B-cell lymphoma, unspecified site: Secondary | ICD-10-CM

## 2014-02-27 LAB — CBC WITH DIFFERENTIAL/PLATELET
BASO%: 2 % (ref 0.0–2.0)
Basophils Absolute: 0.1 10*3/uL (ref 0.0–0.1)
EOS%: 0.5 % (ref 0.0–7.0)
Eosinophils Absolute: 0 10*3/uL (ref 0.0–0.5)
HEMATOCRIT: 35.5 % (ref 34.8–46.6)
HGB: 11.2 g/dL — ABNORMAL LOW (ref 11.6–15.9)
LYMPH%: 29.3 % (ref 14.0–49.7)
MCH: 25 pg — ABNORMAL LOW (ref 25.1–34.0)
MCHC: 31.6 g/dL (ref 31.5–36.0)
MCV: 79.2 fL — ABNORMAL LOW (ref 79.5–101.0)
MONO#: 0.6 10*3/uL (ref 0.1–0.9)
MONO%: 13.6 % (ref 0.0–14.0)
NEUT#: 2.3 10*3/uL (ref 1.5–6.5)
NEUT%: 54.6 % (ref 38.4–76.8)
PLATELETS: 196 10*3/uL (ref 145–400)
RBC: 4.49 10*6/uL (ref 3.70–5.45)
RDW: 15.9 % — ABNORMAL HIGH (ref 11.2–14.5)
WBC: 4.3 10*3/uL (ref 3.9–10.3)
lymph#: 1.3 10*3/uL (ref 0.9–3.3)

## 2014-02-27 LAB — COMPREHENSIVE METABOLIC PANEL (CC13)
ALBUMIN: 3.1 g/dL — AB (ref 3.5–5.0)
ALT: 36 U/L (ref 0–55)
AST: 32 U/L (ref 5–34)
Alkaline Phosphatase: 67 U/L (ref 40–150)
Anion Gap: 8 mEq/L (ref 3–11)
BUN: 11 mg/dL (ref 7.0–26.0)
CALCIUM: 9 mg/dL (ref 8.4–10.4)
CHLORIDE: 105 meq/L (ref 98–109)
CO2: 25 mEq/L (ref 22–29)
Creatinine: 0.8 mg/dL (ref 0.6–1.1)
EGFR: 88 mL/min/{1.73_m2} — ABNORMAL LOW (ref >90)
Glucose: 135 mg/dl (ref 70–140)
POTASSIUM: 4.1 meq/L (ref 3.5–5.1)
Sodium: 138 mEq/L (ref 136–145)
Total Bilirubin: 0.34 mg/dL (ref 0.20–1.20)
Total Protein: 6.9 g/dL (ref 6.4–8.3)

## 2014-02-27 MED ORDER — SODIUM CHLORIDE 0.9 % IV SOLN
375.0000 mg/m2 | Freq: Once | INTRAVENOUS | Status: AC
  Administered 2014-02-27: 800 mg via INTRAVENOUS
  Filled 2014-02-27: qty 80

## 2014-02-27 MED ORDER — DEXAMETHASONE SODIUM PHOSPHATE 10 MG/ML IJ SOLN
10.0000 mg | Freq: Once | INTRAMUSCULAR | Status: AC
  Administered 2014-02-27: 10 mg via INTRAVENOUS

## 2014-02-27 MED ORDER — DEXAMETHASONE SODIUM PHOSPHATE 10 MG/ML IJ SOLN
INTRAMUSCULAR | Status: AC
  Filled 2014-02-27: qty 1

## 2014-02-27 MED ORDER — DIPHENHYDRAMINE HCL 25 MG PO CAPS
ORAL_CAPSULE | ORAL | Status: AC
Start: 2014-02-27 — End: 2014-02-27
  Filled 2014-02-27: qty 2

## 2014-02-27 MED ORDER — DIPHENHYDRAMINE HCL 25 MG PO CAPS
50.0000 mg | ORAL_CAPSULE | Freq: Once | ORAL | Status: AC
  Administered 2014-02-27: 50 mg via ORAL

## 2014-02-27 MED ORDER — PALONOSETRON HCL INJECTION 0.25 MG/5ML
0.2500 mg | Freq: Once | INTRAVENOUS | Status: AC
  Administered 2014-02-27: 0.25 mg via INTRAVENOUS

## 2014-02-27 MED ORDER — VINCRISTINE SULFATE CHEMO INJECTION 1 MG/ML
2.0000 mg | Freq: Once | INTRAVENOUS | Status: AC
  Administered 2014-02-27: 2 mg via INTRAVENOUS
  Filled 2014-02-27: qty 2

## 2014-02-27 MED ORDER — SODIUM CHLORIDE 0.9 % IV SOLN
Freq: Once | INTRAVENOUS | Status: AC
  Administered 2014-02-27: 10:00:00 via INTRAVENOUS

## 2014-02-27 MED ORDER — PALONOSETRON HCL INJECTION 0.25 MG/5ML
INTRAVENOUS | Status: AC
  Filled 2014-02-27: qty 5

## 2014-02-27 MED ORDER — SODIUM CHLORIDE 0.9 % IV SOLN
750.0000 mg/m2 | Freq: Once | INTRAVENOUS | Status: AC
  Administered 2014-02-27: 1640 mg via INTRAVENOUS
  Filled 2014-02-27: qty 82

## 2014-02-27 MED ORDER — SODIUM CHLORIDE 0.9 % IJ SOLN
10.0000 mL | INTRAMUSCULAR | Status: DC | PRN
  Administered 2014-02-27: 10 mL
  Filled 2014-02-27: qty 10

## 2014-02-27 MED ORDER — DOXORUBICIN HCL CHEMO IV INJECTION 2 MG/ML
50.0000 mg/m2 | Freq: Once | INTRAVENOUS | Status: AC
  Administered 2014-02-27: 110 mg via INTRAVENOUS
  Filled 2014-02-27: qty 55

## 2014-02-27 MED ORDER — HEPARIN SOD (PORK) LOCK FLUSH 100 UNIT/ML IV SOLN
500.0000 [IU] | Freq: Once | INTRAVENOUS | Status: AC | PRN
  Administered 2014-02-27: 500 [IU]
  Filled 2014-02-27: qty 5

## 2014-02-27 MED ORDER — ACETAMINOPHEN 325 MG PO TABS
ORAL_TABLET | ORAL | Status: AC
  Filled 2014-02-27: qty 2

## 2014-02-27 MED ORDER — ACETAMINOPHEN 325 MG PO TABS
650.0000 mg | ORAL_TABLET | Freq: Once | ORAL | Status: AC
  Administered 2014-02-27: 650 mg via ORAL

## 2014-02-27 NOTE — Patient Instructions (Signed)
[place] Discharge Instructions for Patients Receiving Chemotherapy  Today you received the following chemotherapy agents adriamycin, vincristine, cytoxan and rituxan  To help prevent nausea and vomiting after your treatment, we encourage you to take your nausea medication as directed by MD   If you develop nausea and vomiting that is not controlled by your nausea medication, call the clinic.   BELOW ARE SYMPTOMS THAT SHOULD BE REPORTED IMMEDIATELY:  *FEVER GREATER THAN 100.5 F  *CHILLS WITH OR WITHOUT FEVER  NAUSEA AND VOMITING THAT IS NOT CONTROLLED WITH YOUR NAUSEA MEDICATION  *UNUSUAL SHORTNESS OF BREATH  *UNUSUAL BRUISING OR BLEEDING  TENDERNESS IN MOUTH AND THROAT WITH OR WITHOUT PRESENCE OF ULCERS  *URINARY PROBLEMS  *BOWEL PROBLEMS  UNUSUAL RASH Items with * indicate a potential emergency and should be followed up as soon as possible.  Feel free to call the clinic you have any questions or concerns. The clinic phone number is [phone].

## 2014-03-02 ENCOUNTER — Ambulatory Visit (HOSPITAL_BASED_OUTPATIENT_CLINIC_OR_DEPARTMENT_OTHER): Payer: Medicare HMO

## 2014-03-02 DIAGNOSIS — Z5189 Encounter for other specified aftercare: Secondary | ICD-10-CM

## 2014-03-02 DIAGNOSIS — C8510 Unspecified B-cell lymphoma, unspecified site: Secondary | ICD-10-CM | POA: Diagnosis not present

## 2014-03-02 MED ORDER — PEGFILGRASTIM INJECTION 6 MG/0.6ML ~~LOC~~
6.0000 mg | PREFILLED_SYRINGE | Freq: Once | SUBCUTANEOUS | Status: AC
  Administered 2014-03-02: 6 mg via SUBCUTANEOUS
  Filled 2014-03-02: qty 0.6

## 2014-03-04 ENCOUNTER — Ambulatory Visit (INDEPENDENT_AMBULATORY_CARE_PROVIDER_SITE_OTHER): Payer: Medicare HMO | Admitting: Internal Medicine

## 2014-03-04 ENCOUNTER — Encounter: Payer: Self-pay | Admitting: Internal Medicine

## 2014-03-04 VITALS — BP 106/70 | HR 108 | Temp 98.1°F | Wt 209.0 lb

## 2014-03-04 DIAGNOSIS — Z23 Encounter for immunization: Secondary | ICD-10-CM | POA: Diagnosis not present

## 2014-03-04 DIAGNOSIS — B182 Chronic viral hepatitis C: Secondary | ICD-10-CM

## 2014-03-04 NOTE — Progress Notes (Signed)
+Rachael West is a 59 y.o. female who presents for initial evaluation and management of a positive Hepatitis C antibody test.  Patient tested positive in 2006.  Hepatitis C risk factors present are: none. Patient denies history of blood transfusion, intranasal drug use, IV drug abuse, renal dialysis, sexual contact with person with liver disease, tattoos. Patient has had other studies performed. Results: hepatitis C RNA by PCR, result: positive. Patient has not had prior treatment for Hepatitis C. Patient does not have a past history of liver disease. Patient does not have a family history of liver disease.   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  She has previously had a biopsy during the evaluation with A2/F1.    Patient does have documented immunity to Hepatitis A. Patient does not have documented immunity to Hepatitis B.     Review of Systems A comprehensive review of systems was negative.   Past Medical History  Diagnosis Date  . Hypertension   . Asthma   . GERD (gastroesophageal reflux disease)   . Arthritis   . Anemia   . Anxiety   . Hepatitis c  . Cancer 12-2013    aggressive lymphoma    Prior to Admission medications   Medication Sig Start Date End Date Taking? Authorizing Provider  albuterol (PROVENTIL) (2.5 MG/3ML) 0.083% nebulizer solution Take 2.5 mg by nebulization every 6 (six) hours as needed for wheezing.    Yes Historical Provider, MD  allopurinol (ZYLOPRIM) 300 MG tablet Take 1 tablet (300 mg total) by mouth daily. 01/12/14  Yes Rulon Eisenmenger, MD  amitriptyline (ELAVIL) 25 MG tablet Take 25 mg by mouth at bedtime.   Yes Historical Provider, MD  amLODipine (NORVASC) 5 MG tablet Take 5 mg by mouth every morning.    Yes Historical Provider, MD  bimatoprost (LUMIGAN) 0.01 % SOLN Place 1 drop into both eyes at bedtime.   Yes Historical Provider, MD  fluticasone-salmeterol (ADVAIR HFA) 115-21 MCG/ACT  inhaler Inhale 2 puffs into the lungs 2 (two) times daily.     Yes Historical Provider, MD  hydrOXYzine (VISTARIL) 50 MG capsule Take 50 mg by mouth at bedtime.   Yes Historical Provider, MD  lidocaine-prilocaine (EMLA) cream Apply to affected area once 01/12/14  Yes Rulon Eisenmenger, MD  lisinopril (PRINIVIL,ZESTRIL) 5 MG tablet Take 5 mg by mouth every morning.    Yes Historical Provider, MD  LORazepam (ATIVAN) 0.5 MG tablet Take 1 tablet (0.5 mg total) by mouth every 6 (six) hours as needed (Nausea or vomiting). 01/12/14  Yes Rulon Eisenmenger, MD  meloxicam (MOBIC) 15 MG tablet Take 15 mg by mouth daily.     Yes Historical Provider, MD  ondansetron (ZOFRAN) 8 MG tablet Take 1 tablet (8 mg total) by mouth 2 (two) times daily. Start the day after chemo for 3 days. Then as needed for nausea or vomiting. 01/12/14  Yes Rulon Eisenmenger, MD  paliperidone (INVEGA) 6 MG 24 hr tablet Take 6 mg by mouth every morning.   Yes Historical Provider, MD  pantoprazole (PROTONIX) 40 MG tablet Take 40 mg by mouth daily.     Yes Historical Provider, MD  prazosin (MINIPRESS) 1 MG capsule Take 1 mg by mouth at bedtime.   Yes Historical Provider, MD  predniSONE (DELTASONE) 20 MG tablet Take 3 tablets (60 mg total) by mouth daily. Take on days 1-5 of chemotherapy. 01/12/14  Yes Rulon Eisenmenger,  MD  prochlorperazine (COMPAZINE) 10 MG tablet Take 1 tablet (10 mg total) by mouth every 6 (six) hours as needed (Nausea or vomiting). 01/12/14  Yes Rulon Eisenmenger, MD  sertraline (ZOLOFT) 100 MG tablet Take 200 mg by mouth every morning.   Yes Historical Provider, MD  traMADol (ULTRAM) 50 MG tablet Take 50 mg by mouth 3 (three) times daily.   Yes Historical Provider, MD  traZODone (DESYREL) 100 MG tablet Take 100 mg by mouth at bedtime.     Yes Historical Provider, MD  triamcinolone (NASACORT) 55 MCG/ACT nasal inhaler Place 2 sprays into the nose daily.     Yes Historical Provider, MD    Allergies  Allergen Reactions  . Latex Hives  .  Penicillins Hives  . Codeine Anxiety  . Sulfa Antibiotics     History  Substance Use Topics  . Smoking status: Former Research scientist (life sciences)  . Smokeless tobacco: Never Used  . Alcohol Use: No    No family history on file.    Objective:   Filed Vitals:   03/04/14 1449  BP: 106/70  Pulse: 108  Temp: 98.1 F (36.7 C)   in no apparent distress and alert HEENT: anicteric Cor RRR and No murmurs clear Bowel sounds are normal, liver is not enlarged, spleen is not enlarged peripheral pulses normal, no pedal edema, no clubbing or cyanosis negative for - jaundice, spider hemangioma, telangiectasia, palmar erythema, ecchymosis and atrophy  Laboratory Genotype:  Lab Results  Component Value Date   HCVGENOTYPE 1a 01/29/2014   HCV viral load:  Lab Results  Component Value Date   HCVQUANT [id number]* 01/29/2014   Lab Results  Component Value Date   WBC 4.3 02/27/2014   HGB 11.2* 02/27/2014   HCT 35.5 02/27/2014   MCV 79.2* 02/27/2014   PLT 196 02/27/2014    Lab Results  Component Value Date   CREATININE 0.8 02/27/2014   BUN 11.0 02/27/2014   NA 138 02/27/2014   K 4.1 02/27/2014   CL 103 10/31/2010   CO2 25 02/27/2014    Lab Results  Component Value Date   ALT 36 02/27/2014   AST 32 02/27/2014   ALKPHOS 67 02/27/2014   BILITOT 0.34 02/27/2014   INR 1.13 01/15/2014      Assessment: Chronic Hepatitis C genotype 1a  Plan: 1) Patient counseled extensively on limiting acetaminophen to no more than 2 grams daily, avoidance of alcohol. 2) Transmission discussed with patient including sexual transmission, sharing razors and toothbrush.   3) Will need referral to gastroenterology if concern for cirrhosis 4) Will need referral for substance abuse counseling: No. 5) Will prescribe Harvoni for 12 weeks once work up complete 6) Hepatitis A vaccine No. 7) Hepatitis B vaccine Yes.  will defer for now pending chemo.  8) Pneumovax vaccine if concern for cirrhosis 9) will follow up after  chemotherapy

## 2014-03-04 NOTE — Patient Instructions (Signed)
Date 03/04/2014  Dear Ms Stacy Gardner, As discussed in the [facility], your hepatitis C therapy will include the following medications:          Harvoni 90mg /400mg  tablet:           Take 1 tablet by mouth once daily   Please note that ALL MEDICATIONS WILL START ON THE SAME DATE for a total of 12 weeks. ---------------------------------------------------------------- Your HCV Treatment Start Date: TBA   Your HCV genotype:  1a    Liver Fibrosis: TBD    ---------------------------------------------------------------- YOUR PHARMACY CONTACT:   [facility] [place] Phone: [phone] Hours: Monday to Friday 7:30 am to 6:00 pm   Please always contact your pharmacy at least 3-4 business days before you run out of medications to ensure your next month's medication is ready or 1 week prior to running out if you receive it by mail.  Remember, each prescription is for 28 days. ---------------------------------------------------------------- GENERAL NOTES REGARDING YOUR HEPATITIS C MEDICATION:  SOFOSBUVIR/LEDIPASVIR (HARVONI): - Harvoni tablet is taken daily with OR without food. - The tablets are orange. - The tablets should be stored at room temperature.  - Acid reducing agents such as H2 blockers (ie. Pepcid (famotidine), Zantac (ranitidine), Tagamet (cimetidine), Axid (nizatidine) and proton pump inhibitors (ie. Prilosec (omeprazole), Protonix (pantoprazole), Nexium (esomeprazole), or Aciphex (rabeprazole)) can decrease effectiveness of Harvoni. Do not take until you have discussed with a health care provider.    -Antacids that contain magnesium and/or aluminum hydroxide (ie. Milk of Magensia, Rolaids, Gaviscon, Maalox, Mylanta, an dArthritis Pain Formula)can reduce absorption of Harvoni, so take them at least 4 hours before or after Harvoni.  -Calcium carbonate (calcium supplements or antacids such as Tums,  Caltrate, Os-Cal)needs to be taken at least 4 hours hours before or after Harvoni.  -St. John's wort or any products that contain St. John's wort like some herbal supplements  Please inform the office prior to starting any of these medications.  - The common side effects with Harvoni:      1. Fatigue      2. Headache      3. Nausea      4. Diarrhea      5. Insomnia   Support Path is a suite of resources designed to help patients start with HARVONI and move toward treatment completion [place] helps patients access therapy and get off to an efficient start  Benefits investigation and prior authorization support Co-pay and other financial assistance A specialty pharmacy finder CO-PAY COUPON The [place] co-pay coupon may help eligible patients lower their out-of-pocket costs. With a co-pay coupon, most eligible patients may pay no more than $5 per co-pay (restrictions apply) www.harvoni.com call 212-392-2759 Not valid for patients enrolled in government healthcare prescription drug programs, such as Medicare Part D and Medicaid. Patients in the coverage gap known as the "donut hole" also are not eligible The HARVONI co-pay coupon program will cover the out-of-pocket costs for HARVONI prescriptions up to a maximum of 25% of the catalog price of a 12-week regimen of HARVONI  Please note that this only lists the most common side effects and is NOT a comprehensive list of the potential side effects of these medications. For more information, please review the drug information sheets that come with your medication package from the pharmacy.  ---------------------------------------------------------------- GENERAL HELPFUL HINTS ON HCV THERAPY: 1. No alcohol. 2. Protect against sun-sensitivity/sunburns (wear sunglasses, hat, long sleeves,  pants and sunscreen). 3. Stay well-hydrated/well-moisturized. 4. Notify the [facility] of any changes in your other  over-the-counter/herbal or prescription medications. 5. If you miss a dose of your medication, take the missed dose as soon as you remember. Return to your regular time/dose schedule the next day.  6.  Do not stop taking your medications without first talking with your healthcare provider. 7.  You may take Tylenol (acetaminophen), as long as the dose is less than 2000 mg (OR no more than 4 tablets of the Tylenol Extra Strengths 500mg  tablet) in 24 hours. 8.  You will need to obtain routine labs and/or office visits at [facility] at weeks 2, 4, 8,  and 12 as well as 12 and 24 weeks after completion of treatment.   Scharlene Gloss, [place] for [place] [address], [place]  [phone]

## 2014-03-12 ENCOUNTER — Encounter (HOSPITAL_COMMUNITY): Payer: Self-pay

## 2014-03-12 ENCOUNTER — Ambulatory Visit (HOSPITAL_COMMUNITY)
Admission: RE | Admit: 2014-03-12 | Discharge: 2014-03-12 | Disposition: A | Discharge status: Home / Self Care | Payer: Medicare HMO | Source: Ambulatory Visit | Attending: Hematology and Oncology | Admitting: Hematology and Oncology

## 2014-03-12 DIAGNOSIS — Z79899 Other long term (current) drug therapy: Secondary | ICD-10-CM | POA: Insufficient documentation

## 2014-03-12 DIAGNOSIS — C8510 Unspecified B-cell lymphoma, unspecified site: Secondary | ICD-10-CM | POA: Insufficient documentation

## 2014-03-12 MED ORDER — IOHEXOL 300 MG/ML  SOLN
100.0000 mL | Freq: Once | INTRAMUSCULAR | Status: AC | PRN
  Administered 2014-03-12: 100 mL via INTRAVENOUS

## 2014-03-13 ENCOUNTER — Other Ambulatory Visit: Payer: Self-pay | Admitting: Hematology and Oncology

## 2014-03-13 DIAGNOSIS — C8580 Other specified types of non-Hodgkin lymphoma, unspecified site: Secondary | ICD-10-CM

## 2014-03-20 ENCOUNTER — Ambulatory Visit (HOSPITAL_BASED_OUTPATIENT_CLINIC_OR_DEPARTMENT_OTHER): Payer: Medicare HMO

## 2014-03-20 ENCOUNTER — Other Ambulatory Visit: Payer: Medicare HMO

## 2014-03-20 ENCOUNTER — Other Ambulatory Visit (HOSPITAL_BASED_OUTPATIENT_CLINIC_OR_DEPARTMENT_OTHER): Payer: Medicare HMO

## 2014-03-20 ENCOUNTER — Ambulatory Visit (HOSPITAL_BASED_OUTPATIENT_CLINIC_OR_DEPARTMENT_OTHER): Payer: Medicare HMO | Admitting: Hematology and Oncology

## 2014-03-20 VITALS — BP 120/65 | HR 99 | Temp 98.3°F | Resp 20 | Ht 65.5 in | Wt 211.5 lb

## 2014-03-20 DIAGNOSIS — G620 Drug-induced polyneuropathy: Secondary | ICD-10-CM | POA: Diagnosis not present

## 2014-03-20 DIAGNOSIS — D6481 Anemia due to antineoplastic chemotherapy: Secondary | ICD-10-CM | POA: Diagnosis not present

## 2014-03-20 DIAGNOSIS — C8510 Unspecified B-cell lymphoma, unspecified site: Secondary | ICD-10-CM

## 2014-03-20 DIAGNOSIS — Z5111 Encounter for antineoplastic chemotherapy: Secondary | ICD-10-CM | POA: Diagnosis not present

## 2014-03-20 DIAGNOSIS — B1920 Unspecified viral hepatitis C without hepatic coma: Secondary | ICD-10-CM | POA: Diagnosis not present

## 2014-03-20 LAB — COMPREHENSIVE METABOLIC PANEL (CC13)
ALK PHOS: 61 U/L (ref 40–150)
ALT: 26 U/L (ref 0–55)
AST: 25 U/L (ref 5–34)
Albumin: 3.1 g/dL — ABNORMAL LOW (ref 3.5–5.0)
Anion Gap: 8 mEq/L (ref 3–11)
BILIRUBIN TOTAL: 0.36 mg/dL (ref 0.20–1.20)
BUN: 11.4 mg/dL (ref 7.0–26.0)
CO2: 25 mEq/L (ref 22–29)
CREATININE: 0.8 mg/dL (ref 0.6–1.1)
Calcium: 9 mg/dL (ref 8.4–10.4)
Chloride: 106 mEq/L (ref 98–109)
EGFR: >90 mL/min/{1.73_m2} (ref >90)
GLUCOSE: 107 mg/dL (ref 70–140)
Potassium: 4.1 mEq/L (ref 3.5–5.1)
SODIUM: 139 meq/L (ref 136–145)
TOTAL PROTEIN: 6.7 g/dL (ref 6.4–8.3)

## 2014-03-20 LAB — CBC WITH DIFFERENTIAL/PLATELET
BASO%: 1.5 % (ref 0.0–2.0)
BASOS ABS: 0.1 10*3/uL (ref 0.0–0.1)
EOS ABS: 0 10*3/uL (ref 0.0–0.5)
EOS%: 0.6 % (ref 0.0–7.0)
HEMATOCRIT: 33.1 % — AB (ref 34.8–46.6)
HGB: 10.3 g/dL — ABNORMAL LOW (ref 11.6–15.9)
LYMPH%: 18.3 % (ref 14.0–49.7)
MCH: 25.5 pg (ref 25.1–34.0)
MCHC: 31.1 g/dL — ABNORMAL LOW (ref 31.5–36.0)
MCV: 82.2 fL (ref 79.5–101.0)
MONO#: 0.6 10*3/uL (ref 0.1–0.9)
MONO%: 15.6 % — ABNORMAL HIGH (ref 0.0–14.0)
NEUT%: 64 % (ref 38.4–76.8)
NEUTROS ABS: 2.5 10*3/uL (ref 1.5–6.5)
Platelets: 185 10*3/uL (ref 145–400)
RBC: 4.02 10*6/uL (ref 3.70–5.45)
RDW: 18.7 % — ABNORMAL HIGH (ref 11.2–14.5)
WBC: 4 10*3/uL (ref 3.9–10.3)
lymph#: 0.7 10*3/uL — ABNORMAL LOW (ref 0.9–3.3)

## 2014-03-20 MED ORDER — SODIUM CHLORIDE 0.9 % IV SOLN
Freq: Once | INTRAVENOUS | Status: AC
  Administered 2014-03-20: 11:00:00 via INTRAVENOUS

## 2014-03-20 MED ORDER — SODIUM CHLORIDE 0.9 % IV SOLN
375.0000 mg/m2 | Freq: Once | INTRAVENOUS | Status: AC
  Administered 2014-03-20: 800 mg via INTRAVENOUS
  Filled 2014-03-20: qty 80

## 2014-03-20 MED ORDER — ACETAMINOPHEN 325 MG PO TABS
ORAL_TABLET | ORAL | Status: AC
  Filled 2014-03-20: qty 2

## 2014-03-20 MED ORDER — DOXORUBICIN HCL CHEMO IV INJECTION 2 MG/ML
50.0000 mg/m2 | Freq: Once | INTRAVENOUS | Status: AC
  Administered 2014-03-20: 110 mg via INTRAVENOUS
  Filled 2014-03-20: qty 55

## 2014-03-20 MED ORDER — PALONOSETRON HCL INJECTION 0.25 MG/5ML
INTRAVENOUS | Status: AC
  Filled 2014-03-20: qty 5

## 2014-03-20 MED ORDER — DIPHENHYDRAMINE HCL 25 MG PO CAPS
ORAL_CAPSULE | ORAL | Status: AC
  Filled 2014-03-20: qty 2

## 2014-03-20 MED ORDER — PROMETHAZINE HCL 25 MG/ML IJ SOLN
12.5000 mg | Freq: Once | INTRAMUSCULAR | Status: AC
  Administered 2014-03-20: 12.5 mg via INTRAVENOUS
  Filled 2014-03-20: qty 1

## 2014-03-20 MED ORDER — SODIUM CHLORIDE 0.9 % IJ SOLN
10.0000 mL | INTRAMUSCULAR | Status: DC | PRN
  Administered 2014-03-20: 10 mL
  Filled 2014-03-20: qty 10

## 2014-03-20 MED ORDER — DIPHENHYDRAMINE HCL 25 MG PO CAPS
50.0000 mg | ORAL_CAPSULE | Freq: Once | ORAL | Status: AC
  Administered 2014-03-20: 50 mg via ORAL

## 2014-03-20 MED ORDER — PALONOSETRON HCL INJECTION 0.25 MG/5ML
0.2500 mg | Freq: Once | INTRAVENOUS | Status: AC
  Administered 2014-03-20: 0.25 mg via INTRAVENOUS

## 2014-03-20 MED ORDER — VINCRISTINE SULFATE CHEMO INJECTION 1 MG/ML
1.5000 mg | Freq: Once | INTRAVENOUS | Status: AC
  Administered 2014-03-20: 1.5 mg via INTRAVENOUS
  Filled 2014-03-20: qty 1.5

## 2014-03-20 MED ORDER — HEPARIN SOD (PORK) LOCK FLUSH 100 UNIT/ML IV SOLN
500.0000 [IU] | Freq: Once | INTRAVENOUS | Status: AC | PRN
  Administered 2014-03-20: 500 [IU]
  Filled 2014-03-20: qty 5

## 2014-03-20 MED ORDER — SODIUM CHLORIDE 0.9 % IV SOLN
10.0000 mg | Freq: Once | INTRAVENOUS | Status: AC
  Administered 2014-03-20: 10 mg via INTRAVENOUS
  Filled 2014-03-20: qty 1

## 2014-03-20 MED ORDER — SODIUM CHLORIDE 0.9 % IV SOLN
750.0000 mg/m2 | Freq: Once | INTRAVENOUS | Status: AC
  Administered 2014-03-20: 1640 mg via INTRAVENOUS
  Filled 2014-03-20: qty 82

## 2014-03-20 MED ORDER — ACETAMINOPHEN 325 MG PO TABS
650.0000 mg | ORAL_TABLET | Freq: Once | ORAL | Status: AC
  Administered 2014-03-20: 650 mg via ORAL

## 2014-03-20 NOTE — Assessment & Plan Note (Addendum)
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 Denies any nausea vomiting. Blood counts did not show any neutropenia, previous anemia has also improved.   Radiology review: CT scans done 03/12/2014 midpoint of treatment revealed decrease in size of lymph nodes from 2.7 x 4.4 cm down to 1.8 x 3.1 cm suggesting good response to chemotherapy. CT neck reveals improvement in lymphadenopathy in the supraclavicular area largest was 3.3 x 2.5 cm and now it is 2.6 x 1.6 cm, another lymph node supraclavicular area measures 1.5 x 1.4 cm previously 2.8 x 2.6 cm  Monitoring closely for chemotherapy toxicities Continue with tumor lysis prophylaxis with allopurinol  Hepatitis C: Consulted infectious diseases Return to clinic in 3 weeks for cycle 5.

## 2014-03-20 NOTE — Patient Instructions (Signed)
Universal Discharge Instructions for Patients Receiving Chemotherapy  Today you received the following chemotherapy agents: Adriamycin, Vincristine, Cytoxan, Rituxan  To help prevent nausea and vomiting after your treatment, we encourage you to take your nausea medication:Compazine 10 mg every 6 hours as needed; Zofran 8 mg every 12 hours as needed.   If you develop nausea and vomiting that is not controlled by your nausea medication, call the clinic.   BELOW ARE SYMPTOMS THAT SHOULD BE REPORTED IMMEDIATELY:  *FEVER GREATER THAN 100.5 F  *CHILLS WITH OR WITHOUT FEVER  NAUSEA AND VOMITING THAT IS NOT CONTROLLED WITH YOUR NAUSEA MEDICATION  *UNUSUAL SHORTNESS OF BREATH  *UNUSUAL BRUISING OR BLEEDING  TENDERNESS IN MOUTH AND THROAT WITH OR WITHOUT PRESENCE OF ULCERS  *URINARY PROBLEMS  *BOWEL PROBLEMS  UNUSUAL RASH Items with * indicate a potential emergency and should be followed up as soon as possible.  Feel free to call the clinic you have any questions or concerns. The clinic phone number is [phone].

## 2014-03-20 NOTE — Progress Notes (Signed)
Patient reporting nausea nearing end of cytoxan infusion. Patient received tylenol, benadryl, decadron, and aloxi premeds. Dr. Lindi Adie notified. Allergies reviewed. Verbal order given for phenergan 12.5 mg IVP.

## 2014-03-20 NOTE — Progress Notes (Signed)
Patient Care Team: [name], MD as PCP - General (Family Medicine)  DIAGNOSIS: No matching staging information was found for the patient.  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 PET activity in the bone marrow and spleen thought to be related to chemotherapy rather than disease. 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 She complains of tingling and numbness of the tips of the fingers. Denies any symptoms in her toes. She also complains of mild nausea. For which she takes antiemetics and this seemed to be helping her. She does not have any appetite or taste. But she is drinking enough liquids to keep herself hydrated.  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  I have reviewed the past medical history, past surgical history, social history and family history with the patient and they are unchanged from previous note.  ALLERGIES:  is allergic to latex; penicillins; codeine; and sulfa antibiotics.  MEDICATIONS:  Current Outpatient Prescriptions  Medication Sig Dispense Refill  . albuterol (PROVENTIL) (2.5 MG/3ML) 0.083% nebulizer solution Take 2.5 mg by nebulization every 6 (six) hours as needed for wheezing.     Marland Kitchen allopurinol (ZYLOPRIM) 300 MG tablet TAKE 1 TABLET (300 MG TOTAL) BY MOUTH DAILY. 30 tablet 1  . amitriptyline (ELAVIL) 25 MG tablet Take 25 mg by mouth at bedtime.    Marland Kitchen amLODipine (NORVASC) 5 MG tablet Take 5 mg by mouth every morning.     . bimatoprost (LUMIGAN) 0.01 % SOLN Place 1 drop into both eyes at bedtime.    . fluticasone-salmeterol (ADVAIR HFA) 115-21 MCG/ACT inhaler Inhale 2 puffs into the lungs 2 (two) times daily.      . hydrOXYzine (VISTARIL) 50 MG capsule Take 50 mg by mouth at bedtime.    . lidocaine-prilocaine (EMLA) cream Apply to affected area once 30 g 3  . lisinopril (PRINIVIL,ZESTRIL) 5 MG tablet Take 5 mg by mouth every morning.     Marland Kitchen LORazepam (ATIVAN) 0.5 MG tablet Take 1 tablet (0.5 mg total) by mouth every 6 (six) hours as needed (Nausea or vomiting). 30 tablet 0  . meloxicam (MOBIC) 15 MG tablet Take 15 mg by mouth daily.      Marland Kitchen omeprazole (PRILOSEC) 40 MG capsule   5  . ondansetron (ZOFRAN) 8 MG tablet Take 1 tablet (8 mg total) by mouth 2 (two) times daily. Start the day after chemo for 3 days. Then  as needed for nausea or vomiting. 30 tablet 1  . paliperidone (INVEGA) 6 MG 24 hr tablet Take 6 mg by mouth every morning.    . pantoprazole (PROTONIX) 40 MG tablet Take 40 mg by mouth daily.      . prazosin (MINIPRESS) 1 MG capsule Take 1 mg by mouth at bedtime.    . predniSONE (DELTASONE) 20 MG tablet Take 3 tablets (60 mg total) by mouth daily. Take on days 1-5 of chemotherapy. 15 tablet 5  .  prochlorperazine (COMPAZINE) 10 MG tablet Take 1 tablet (10 mg total) by mouth every 6 (six) hours as needed (Nausea or vomiting). 30 tablet 6  . sertraline (ZOLOFT) 100 MG tablet Take 200 mg by mouth every morning.    . traMADol (ULTRAM) 50 MG tablet Take 50 mg by mouth 3 (three) times daily.    . traZODone (DESYREL) 100 MG tablet Take 100 mg by mouth at bedtime.      . triamcinolone (NASACORT) 55 MCG/ACT nasal inhaler Place 2 sprays into the nose daily.       No current facility-administered medications for this visit.    PHYSICAL EXAMINATION: ECOG PERFORMANCE STATUS: 1 - Symptomatic but completely ambulatory  Filed Vitals:   03/20/14 0909  BP: 120/65  Pulse: 99  Temp: 98.3 F (36.8 C)  Resp: 20   Filed Weights   03/20/14 0909  Weight: 211 lb 8 oz (95.936 kg)    GENERAL:alert, no distress and comfortable, nail darkening discoloration SKIN: skin color, texture, turgor are normal, no rashes or significant lesions EYES: normal, Conjunctiva are pink and non-injected, sclera clear OROPHARYNX:no exudate, no erythema and lips, buccal mucosa, and tongue normal  NECK: supple, thyroid normal size, non-tender, without nodularity LYMPH:  no palpable lymphadenopathy in the cervical, axillary or inguinal LUNGS: clear to auscultation and percussion with normal breathing effort HEART: regular rate & rhythm and no murmurs and no lower extremity edema ABDOMEN:abdomen soft, non-tender and normal bowel sounds Musculoskeletal:no cyanosis of digits and no clubbing  NEURO: 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   LABORATORY DATA:  I have reviewed the data as listed   Chemistry      Component Value Date/Time   NA 139 03/20/2014 0855   NA 139 10/31/2010 2345   K 4.1 03/20/2014 0855   K 4.1 10/31/2010 2345   CL 103 10/31/2010 2345   CO2 25 03/20/2014 0855   CO2 25 07/13/2009 2121   BUN 11.4 03/20/2014 0855   BUN 7 10/31/2010 2345   CREATININE 0.8 03/20/2014 0855   CREATININE 0.80 10/31/2010  2345      Component Value Date/Time   CALCIUM 9.0 03/20/2014 0855   CALCIUM 9.1 07/13/2009 2121   ALKPHOS 61 03/20/2014 0855   ALKPHOS 77 10/31/2010 2335   AST 25 03/20/2014 0855   AST 35 10/31/2010 2335   ALT 26 03/20/2014 0855   ALT 30 10/31/2010 2335   BILITOT 0.36 03/20/2014 0855   BILITOT 0.5 10/31/2010 2335       Lab Results  Component Value Date   WBC 4.0 03/20/2014   HGB 10.3* 03/20/2014   HCT 33.1* 03/20/2014   MCV 82.2 03/20/2014   PLT 185 03/20/2014   NEUTROABS 2.5 03/20/2014     RADIOGRAPHIC STUDIES: I have personally reviewed the radiology reports and agreed with their findings. CT neck chest abdomen pelvis was reviewed with the patient showing good response to chemotherapy  ASSESSMENT & PLAN:  Large B-cell lymphoma Aggressive large B-cell lymphoma: CD5 and  10 negative, CD20 positive BCL 2 and BCL 6 positive: Differential diagnosis aggressive large B-cell lymphoma versus B-cell lymphoma unclassified, Ki-67 100%, bulky lymphadenopathy supraclavicular and axillary lymph nodes, bone marrow negative, stage IIB  Treatment plan: R CHOP chemotherapy started 01/16/2014 plan is to give 6 cycles  Today is cycle 4 day 1 RCHOP  Chemotherapy toxicities:  1. Alopecia 2. Loss of taste 3. Anemia grade 1 due to chemotherapy 4. 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  Radiology review: CT scans done 03/12/2014 midpoint of treatment revealed decrease in size of lymph nodes from 2.7 x 4.4 cm down to 1.8 x 3.1 cm suggesting good response to chemotherapy. CT neck reveals improvement in lymphadenopathy in the supraclavicular area largest was 3.3 x 2.5 cm and now it is 2.6 x 1.6 cm, another lymph node supraclavicular area measures 1.5 x 1.4 cm previously 2.8 x 2.6 cm  Monitoring closely for chemotherapy toxicities Continue with tumor lysis prophylaxis with allopurinol  Hepatitis C: Seen infectious diseases, and monitoring her liver  function tests which have been stable. Return to clinic in 3 weeks for cycle 5.     Orders Placed This Encounter  Procedures  . CBC with Differential    Standing Status: Future     Number of Occurrences:      Standing Expiration Date: 03/20/2015  . Comprehensive metabolic panel (Cmet) - [facility]    Standing Status: Future     Number of Occurrences:      Standing Expiration Date: 03/20/2015  . Lactate dehydrogenase (LDH) - [facility]    Standing Status: Future     Number of Occurrences:      Standing Expiration Date: 03/20/2015   The patient has a good understanding of the overall plan. she agrees with it. She will call with any problems that may develop before her next visit here.   Rulon Eisenmenger, MD

## 2014-03-23 ENCOUNTER — Ambulatory Visit: Payer: Medicare HMO

## 2014-03-23 ENCOUNTER — Ambulatory Visit (HOSPITAL_BASED_OUTPATIENT_CLINIC_OR_DEPARTMENT_OTHER): Payer: Medicare HMO

## 2014-03-23 DIAGNOSIS — Z5189 Encounter for other specified aftercare: Secondary | ICD-10-CM

## 2014-03-23 DIAGNOSIS — C8510 Unspecified B-cell lymphoma, unspecified site: Secondary | ICD-10-CM

## 2014-03-23 MED ORDER — PEGFILGRASTIM INJECTION 6 MG/0.6ML ~~LOC~~
6.0000 mg | PREFILLED_SYRINGE | Freq: Once | SUBCUTANEOUS | Status: AC
  Administered 2014-03-23: 6 mg via SUBCUTANEOUS
  Filled 2014-03-23: qty 0.6

## 2014-04-06 ENCOUNTER — Encounter: Payer: Self-pay | Admitting: Nurse Practitioner

## 2014-04-06 NOTE — Progress Notes (Signed)
I called and advised the patient she would need to call billing and see if asst is available for her if not they will set her up on monthly pmts.

## 2014-04-10 ENCOUNTER — Encounter: Payer: Self-pay | Admitting: Nurse Practitioner

## 2014-04-10 ENCOUNTER — Ambulatory Visit (HOSPITAL_BASED_OUTPATIENT_CLINIC_OR_DEPARTMENT_OTHER): Payer: Medicare HMO | Admitting: Nurse Practitioner

## 2014-04-10 ENCOUNTER — Ambulatory Visit (HOSPITAL_BASED_OUTPATIENT_CLINIC_OR_DEPARTMENT_OTHER): Payer: Medicare HMO

## 2014-04-10 ENCOUNTER — Other Ambulatory Visit (HOSPITAL_BASED_OUTPATIENT_CLINIC_OR_DEPARTMENT_OTHER): Payer: Medicare HMO

## 2014-04-10 ENCOUNTER — Telehealth: Payer: Self-pay | Admitting: Hematology and Oncology

## 2014-04-10 VITALS — BP 108/66 | HR 108 | Temp 98.2°F | Resp 18 | Ht 65.5 in | Wt 209.0 lb

## 2014-04-10 DIAGNOSIS — G620 Drug-induced polyneuropathy: Secondary | ICD-10-CM

## 2014-04-10 DIAGNOSIS — B1920 Unspecified viral hepatitis C without hepatic coma: Secondary | ICD-10-CM | POA: Diagnosis not present

## 2014-04-10 DIAGNOSIS — Z5112 Encounter for antineoplastic immunotherapy: Secondary | ICD-10-CM

## 2014-04-10 DIAGNOSIS — B182 Chronic viral hepatitis C: Secondary | ICD-10-CM

## 2014-04-10 DIAGNOSIS — D6481 Anemia due to antineoplastic chemotherapy: Secondary | ICD-10-CM

## 2014-04-10 DIAGNOSIS — Z5111 Encounter for antineoplastic chemotherapy: Secondary | ICD-10-CM | POA: Diagnosis not present

## 2014-04-10 DIAGNOSIS — C8510 Unspecified B-cell lymphoma, unspecified site: Secondary | ICD-10-CM | POA: Diagnosis not present

## 2014-04-10 LAB — COMPREHENSIVE METABOLIC PANEL (CC13)
ALK PHOS: 68 U/L (ref 40–150)
ALT: 23 U/L (ref 0–55)
AST: 28 U/L (ref 5–34)
Albumin: 3.2 g/dL — ABNORMAL LOW (ref 3.5–5.0)
Anion Gap: 12 mEq/L — ABNORMAL HIGH (ref 3–11)
BUN: 8.8 mg/dL (ref 7.0–26.0)
CO2: 21 mEq/L — ABNORMAL LOW (ref 22–29)
CREATININE: 0.8 mg/dL (ref 0.6–1.1)
Calcium: 9.1 mg/dL (ref 8.4–10.4)
Chloride: 106 mEq/L (ref 98–109)
Glucose: 102 mg/dl (ref 70–140)
Potassium: 4.2 mEq/L (ref 3.5–5.1)
Sodium: 138 mEq/L (ref 136–145)
Total Bilirubin: 0.49 mg/dL (ref 0.20–1.20)
Total Protein: 6.7 g/dL (ref 6.4–8.3)

## 2014-04-10 LAB — CBC WITH DIFFERENTIAL/PLATELET
BASO%: 1.6 % (ref 0.0–2.0)
BASOS ABS: 0 10*3/uL (ref 0.0–0.1)
EOS%: 1.4 % (ref 0.0–7.0)
Eosinophils Absolute: 0 10*3/uL (ref 0.0–0.5)
HCT: 32.6 % — ABNORMAL LOW (ref 34.8–46.6)
HEMOGLOBIN: 10.5 g/dL — AB (ref 11.6–15.9)
LYMPH%: 24.6 % (ref 14.0–49.7)
MCH: 26.6 pg (ref 25.1–34.0)
MCHC: 32 g/dL (ref 31.5–36.0)
MCV: 82.9 fL (ref 79.5–101.0)
MONO#: 0.5 10*3/uL (ref 0.1–0.9)
MONO%: 17.8 % — AB (ref 0.0–14.0)
NEUT#: 1.6 10*3/uL (ref 1.5–6.5)
NEUT%: 54.6 % (ref 38.4–76.8)
Platelets: 205 10*3/uL (ref 145–400)
RBC: 3.93 10*6/uL (ref 3.70–5.45)
RDW: 20.8 % — AB (ref 11.2–14.5)
WBC: 2.9 10*3/uL — ABNORMAL LOW (ref 3.9–10.3)
lymph#: 0.7 10*3/uL — ABNORMAL LOW (ref 0.9–3.3)

## 2014-04-10 LAB — LACTATE DEHYDROGENASE (CC13): LDH: 224 U/L (ref 125–245)

## 2014-04-10 MED ORDER — HEPARIN SOD (PORK) LOCK FLUSH 100 UNIT/ML IV SOLN
500.0000 [IU] | Freq: Once | INTRAVENOUS | Status: AC | PRN
  Administered 2014-04-10: 500 [IU]
  Filled 2014-04-10: qty 5

## 2014-04-10 MED ORDER — ACETAMINOPHEN 325 MG PO TABS
ORAL_TABLET | ORAL | Status: AC
  Filled 2014-04-10: qty 2

## 2014-04-10 MED ORDER — DIPHENHYDRAMINE HCL 25 MG PO CAPS
50.0000 mg | ORAL_CAPSULE | Freq: Once | ORAL | Status: AC
  Administered 2014-04-10: 50 mg via ORAL

## 2014-04-10 MED ORDER — SODIUM CHLORIDE 0.9 % IV SOLN
750.0000 mg/m2 | Freq: Once | INTRAVENOUS | Status: AC
  Administered 2014-04-10: 1640 mg via INTRAVENOUS
  Filled 2014-04-10: qty 82

## 2014-04-10 MED ORDER — ONDANSETRON HCL 8 MG PO TABS: 8.0000 mg | ORAL_TABLET | Freq: Two times a day (BID) | ORAL | Status: DC

## 2014-04-10 MED ORDER — SODIUM CHLORIDE 0.9 % IV SOLN
10.0000 mg | Freq: Once | INTRAVENOUS | Status: AC
  Administered 2014-04-10: 10 mg via INTRAVENOUS
  Filled 2014-04-10: qty 1

## 2014-04-10 MED ORDER — DIPHENHYDRAMINE HCL 25 MG PO CAPS
ORAL_CAPSULE | ORAL | Status: AC
  Filled 2014-04-10: qty 2

## 2014-04-10 MED ORDER — SODIUM CHLORIDE 0.9 % IV SOLN
Freq: Once | INTRAVENOUS | Status: AC
  Administered 2014-04-10: 10:00:00 via INTRAVENOUS

## 2014-04-10 MED ORDER — DOXORUBICIN HCL CHEMO IV INJECTION 2 MG/ML
50.0000 mg/m2 | Freq: Once | INTRAVENOUS | Status: AC
  Administered 2014-04-10: 110 mg via INTRAVENOUS
  Filled 2014-04-10: qty 55

## 2014-04-10 MED ORDER — PALONOSETRON HCL INJECTION 0.25 MG/5ML
0.2500 mg | Freq: Once | INTRAVENOUS | Status: AC
  Administered 2014-04-10: 0.25 mg via INTRAVENOUS

## 2014-04-10 MED ORDER — SODIUM CHLORIDE 0.9 % IJ SOLN
10.0000 mL | INTRAMUSCULAR | Status: DC | PRN
  Administered 2014-04-10: 10 mL
  Filled 2014-04-10: qty 10

## 2014-04-10 MED ORDER — SODIUM CHLORIDE 0.9 % IV SOLN
375.0000 mg/m2 | Freq: Once | INTRAVENOUS | Status: AC
  Administered 2014-04-10: 800 mg via INTRAVENOUS
  Filled 2014-04-10: qty 80

## 2014-04-10 MED ORDER — ACETAMINOPHEN 325 MG PO TABS
650.0000 mg | ORAL_TABLET | Freq: Once | ORAL | Status: AC
  Administered 2014-04-10: 650 mg via ORAL

## 2014-04-10 MED ORDER — VINCRISTINE SULFATE CHEMO INJECTION 1 MG/ML
1.5000 mg | Freq: Once | INTRAVENOUS | Status: AC
  Administered 2014-04-10: 1.5 mg via INTRAVENOUS
  Filled 2014-04-10: qty 1.5

## 2014-04-10 MED ORDER — PALONOSETRON HCL INJECTION 0.25 MG/5ML
INTRAVENOUS | Status: AC
  Filled 2014-04-10: qty 5

## 2014-04-10 NOTE — Progress Notes (Signed)
Patient Care Team: [name], MD as PCP - General (Family Medicine)  DIAGNOSIS: No matching staging information was found for the patient.  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 PET activity in the bone marrow and spleen thought to be related to chemotherapy rather than disease. 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 This is no worse today. She has numbness that comes and goes in her toes. She denies fevers or chills. She has mild nausea managed with zofran. She has a small hard bowel movement daily, despite use of miralax and 2 stool softeners BID. She eats only 3 small meals daily, but has increased her fruit and water intake.   REVIEW OF SYSTEMS:   Constitutional: Denies fevers, chills or abnormal weight loss Eyes: Denies blurriness of vision Ears, nose, mouth, throat, and face: Denies mucositis or sore throat Respiratory: Denies cough, dyspnea or wheezes Cardiovascular: Denies palpitation, chest discomfort or lower extremity swelling Gastrointestinal:  Denies nausea, heartburn or change in bowel habits Skin: Denies abnormal skin rashes Lymphatics: Denies new lymphadenopathy or easy bruising Neurological: Neuropathy in the  fingers Behavioral/Psych: Mood is stable, no new changes  All other systems were reviewed with the patient and are negative.  I have reviewed the past medical history, past surgical history, social history and family history with the patient and they are unchanged from previous note.  ALLERGIES:  is allergic to latex; penicillins; codeine; and sulfa antibiotics.  MEDICATIONS:  Current Outpatient Prescriptions  Medication Sig Dispense Refill  . albuterol (PROVENTIL) (2.5 MG/3ML) 0.083% nebulizer solution Take 2.5 mg by nebulization every 6 (six) hours as needed for wheezing.     Marland Kitchen allopurinol (ZYLOPRIM) 300 MG tablet TAKE 1 TABLET (300 MG TOTAL) BY MOUTH DAILY. 30 tablet 1  . amitriptyline (ELAVIL) 25 MG tablet Take 25 mg by mouth at bedtime.    Marland Kitchen amLODipine (NORVASC) 5 MG tablet Take 5 mg by mouth every morning.     . bimatoprost (LUMIGAN) 0.01 % SOLN Place 1 drop into both eyes at bedtime.    . fluticasone-salmeterol (ADVAIR HFA) 115-21 MCG/ACT inhaler Inhale 2 puffs into the lungs 2 (two) times daily.      . hydrOXYzine (VISTARIL) 50 MG capsule Take 50 mg by mouth at bedtime.    . lidocaine-prilocaine (EMLA) cream Apply to affected area once 30 g 3  . lisinopril (PRINIVIL,ZESTRIL) 5 MG tablet Take 5 mg by mouth every morning.     Marland Kitchen LORazepam (ATIVAN) 0.5 MG tablet Take 1 tablet (0.5 mg total) by mouth every 6 (six) hours as needed (Nausea or vomiting). 30 tablet 0  . meloxicam (MOBIC) 15 MG tablet Take 15 mg by mouth daily.      Marland Kitchen omeprazole (PRILOSEC) 40 MG capsule   5  . ondansetron (ZOFRAN) 8 MG tablet Take  1 tablet (8 mg total) by mouth 2 (two) times daily. Start the day after chemo for 3 days. Then as needed for nausea or vomiting. 30 tablet 1  . paliperidone (INVEGA) 6 MG 24 hr tablet Take 6 mg by mouth every morning.    . prazosin (MINIPRESS) 1 MG capsule Take 1 mg by mouth at bedtime.    . predniSONE (DELTASONE) 20 MG tablet Take 3 tablets (60 mg total) by mouth daily. Take on  days 1-5 of chemotherapy. 15 tablet 5  . prochlorperazine (COMPAZINE) 10 MG tablet Take 1 tablet (10 mg total) by mouth every 6 (six) hours as needed (Nausea or vomiting). 30 tablet 6  . sertraline (ZOLOFT) 100 MG tablet Take 200 mg by mouth every morning.    . traMADol (ULTRAM) 50 MG tablet Take 50 mg by mouth 3 (three) times daily.    . traZODone (DESYREL) 100 MG tablet Take 100 mg by mouth at bedtime.      . triamcinolone (NASACORT) 55 MCG/ACT nasal inhaler Place 2 sprays into the nose daily.      . pantoprazole (PROTONIX) 40 MG tablet Take 40 mg by mouth daily.       No current facility-administered medications for this visit.    PHYSICAL EXAMINATION: ECOG PERFORMANCE STATUS: 1 - Symptomatic but completely ambulatory  Filed Vitals:   04/10/14 0845  BP: 108/66  Pulse: 108  Temp: 98.2 F (36.8 C)  Resp: 18   Filed Weights   04/10/14 0845  Weight: 209 lb (94.802 kg)    GENERAL:alert, no distress and comfortable, nail darkening discoloration SKIN: skin color, texture, turgor are normal, no rashes or significant lesions EYES: normal, Conjunctiva are pink and non-injected, sclera clear OROPHARYNX:no exudate, no erythema and lips, buccal mucosa, and tongue normal  NECK: supple, thyroid normal size, non-tender, without nodularity LYMPH:  no palpable lymphadenopathy in the cervical, axillary or inguinal LUNGS: clear to auscultation and percussion with normal breathing effort HEART: regular rate & rhythm and no murmurs and no lower extremity edema ABDOMEN:abdomen soft, non-tender and normal bowel sounds Musculoskeletal:no cyanosis of digits and no clubbing  NEURO: 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   LABORATORY DATA:  I have reviewed the data as listed   Chemistry      Component Value Date/Time   NA 139 03/20/2014 0855   NA 139 10/31/2010 2345   K 4.1 03/20/2014 0855   K 4.1 10/31/2010 2345   CL 103 10/31/2010 2345   CO2 25 03/20/2014 0855   CO2 25 07/13/2009 2121   BUN  11.4 03/20/2014 0855   BUN 7 10/31/2010 2345   CREATININE 0.8 03/20/2014 0855   CREATININE 0.80 10/31/2010 2345      Component Value Date/Time   CALCIUM 9.0 03/20/2014 0855   CALCIUM 9.1 07/13/2009 2121   ALKPHOS 61 03/20/2014 0855   ALKPHOS 77 10/31/2010 2335   AST 25 03/20/2014 0855   AST 35 10/31/2010 2335   ALT 26 03/20/2014 0855   ALT 30 10/31/2010 2335   BILITOT 0.36 03/20/2014 0855   BILITOT 0.5 10/31/2010 2335       Lab Results  Component Value Date   WBC 2.9* 04/10/2014   HGB 10.5* 04/10/2014   HCT 32.6* 04/10/2014   MCV 82.9 04/10/2014   PLT 205 04/10/2014   NEUTROABS 1.6 04/10/2014     RADIOGRAPHIC STUDIES: I have personally reviewed the radiology reports and agreed with their findings. CT neck chest abdomen pelvis was reviewed with the patient showing  good response to chemotherapy  ASSESSMENT & PLAN:  Large B-cell lymphoma Aggressive large B-cell lymphoma: CD5 and 10 negative, CD20 positive BCL 2 and BCL 6 positive: Differential diagnosis aggressive large B-cell lymphoma versus B-cell lymphoma unclassified, Ki-67 100%, bulky lymphadenopathy supraclavicular and axillary lymph nodes, bone marrow negative, stage IIB  Treatment plan: R CHOP chemotherapy started 01/16/2014 plan is to give 6 cycles  Today is cycle 5 day 1 RCHOP  Chemotherapy toxicities:  1. Alopecia 2. Loss of taste 3. Anemia grade 1 due to chemotherapy 4. 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  Radiology review: CT scans done 03/12/2014 midpoint of treatment revealed decrease in size of lymph nodes from 2.7 x 4.4 cm down to 1.8 x 3.1 cm suggesting good response to chemotherapy. CT neck reveals improvement in lymphadenopathy in the supraclavicular area largest was 3.3 x 2.5 cm and now it is 2.6 x 1.6 cm, another lymph node supraclavicular area measures 1.5 x 1.4 cm previously 2.8 x 2.6 cm  Monitoring closely for chemotherapy  toxicities Continue with tumor lysis prophylaxis with allopurinol  Hepatitis C: Seen infectious diseases, and monitoring her liver function tests which have been stable.  Return to clinic in 3 weeks for cycle 6.   No orders of the defined types were placed in this encounter.   The patient has a good understanding of the overall plan. she agrees with it. She will call with any problems that may develop before her next visit here.   Laurie Panda, NP

## 2014-04-10 NOTE — Telephone Encounter (Signed)
Gave patient avs report and appointments for April  °

## 2014-04-10 NOTE — Progress Notes (Signed)
Per Gentry Fitz, NP okay to proceed with treatment today with WBC: 2.9 and HR of 108.

## 2014-04-10 NOTE — Patient Instructions (Signed)
[facility] Discharge Instructions for Patients Receiving Chemotherapy  Today you received the following chemotherapy agents: Adriamycin, Vincristine, Cytoxan, and Rituxan.   To help prevent nausea and vomiting after your treatment, we encourage you to take your nausea medication as directed.    If you develop nausea and vomiting that is not controlled by your nausea medication, call the clinic.   BELOW ARE SYMPTOMS THAT SHOULD BE REPORTED IMMEDIATELY:  *FEVER GREATER THAN 100.5 F  *CHILLS WITH OR WITHOUT FEVER  NAUSEA AND VOMITING THAT IS NOT CONTROLLED WITH YOUR NAUSEA MEDICATION  *UNUSUAL SHORTNESS OF BREATH  *UNUSUAL BRUISING OR BLEEDING  TENDERNESS IN MOUTH AND THROAT WITH OR WITHOUT PRESENCE OF ULCERS  *URINARY PROBLEMS  *BOWEL PROBLEMS  UNUSUAL RASH Items with * indicate a potential emergency and should be followed up as soon as possible.  Feel free to call the clinic you have any questions or concerns. The clinic phone number is [phone].  Please show the CHEMO ALERT CARD at check-in to the Emergency Department and triage nurse.   

## 2014-04-13 ENCOUNTER — Telehealth: Payer: Self-pay | Admitting: *Deleted

## 2014-04-13 ENCOUNTER — Ambulatory Visit (HOSPITAL_BASED_OUTPATIENT_CLINIC_OR_DEPARTMENT_OTHER): Payer: Medicare HMO

## 2014-04-13 DIAGNOSIS — Z5189 Encounter for other specified aftercare: Secondary | ICD-10-CM

## 2014-04-13 DIAGNOSIS — C8510 Unspecified B-cell lymphoma, unspecified site: Secondary | ICD-10-CM

## 2014-04-13 MED ORDER — PEGFILGRASTIM INJECTION 6 MG/0.6ML ~~LOC~~
6.0000 mg | PREFILLED_SYRINGE | Freq: Once | SUBCUTANEOUS | Status: AC
  Administered 2014-04-13: 6 mg via SUBCUTANEOUS
  Filled 2014-04-13: qty 0.6

## 2014-04-13 NOTE — Telephone Encounter (Signed)
Called Rachael West about missed appointment today.  Patient is coming  Stated that she didn't know about it.

## 2014-04-30 NOTE — Assessment & Plan Note (Signed)
Large B-cell lymphoma Aggressive large B-cell lymphoma: CD5 and 10 negative, CD20 positive BCL 2 and BCL 6 positive: Differential diagnosis aggressive large B-cell lymphoma versus B-cell lymphoma unclassified, Ki-67 100%, bulky lymphadenopathy supraclavicular and axillary lymph nodes, bone marrow negative, stage IIB  Treatment plan: R CHOP chemotherapy started 01/16/2014 plan is to give 6 cycles  Today is cycle 6 day 1 RCHOP (last cycle)  Chemotherapy toxicities:  1. Alopecia 2. Loss of taste 3. Anemia grade 1 due to chemotherapy 4. 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  Radiology review: CT scans done 03/12/2014 midpoint of treatment revealed decrease in size of lymph nodes from 2.7 x 4.4 cm down to 1.8 x 3.1 cm suggesting good response to chemotherapy. CT neck reveals improvement in lymphadenopathy in the supraclavicular area largest was 3.3 x 2.5 cm and now it is 2.6 x 1.6 cm, another lymph node supraclavicular area measures 1.5 x 1.4 cm previously 2.8 x 2.6 cm  Monitoring closely for chemotherapy toxicities Continue with tumor lysis prophylaxis with allopurinol  Hepatitis C: Seen infectious diseases, and monitoring her liver function tests which have been stable.  Plan: 1. PET/CT scan to assess response to chemotherapy 2. Follow-up in 3 weeks to discuss the result.

## 2014-05-01 ENCOUNTER — Ambulatory Visit (HOSPITAL_BASED_OUTPATIENT_CLINIC_OR_DEPARTMENT_OTHER): Payer: Medicare HMO

## 2014-05-01 ENCOUNTER — Ambulatory Visit (HOSPITAL_BASED_OUTPATIENT_CLINIC_OR_DEPARTMENT_OTHER): Payer: Medicare HMO | Admitting: Hematology and Oncology

## 2014-05-01 ENCOUNTER — Telehealth: Payer: Self-pay | Admitting: Hematology and Oncology

## 2014-05-01 ENCOUNTER — Other Ambulatory Visit (HOSPITAL_BASED_OUTPATIENT_CLINIC_OR_DEPARTMENT_OTHER): Payer: Medicare HMO

## 2014-05-01 VITALS — BP 126/72 | HR 104 | Temp 98.9°F | Resp 20

## 2014-05-01 VITALS — BP 110/70 | HR 108 | Temp 98.1°F | Resp 18 | Ht 65.5 in | Wt 202.8 lb

## 2014-05-01 DIAGNOSIS — C8334 Diffuse large B-cell lymphoma, lymph nodes of axilla and upper limb: Secondary | ICD-10-CM

## 2014-05-01 DIAGNOSIS — Z5112 Encounter for antineoplastic immunotherapy: Secondary | ICD-10-CM | POA: Diagnosis not present

## 2014-05-01 DIAGNOSIS — Z5111 Encounter for antineoplastic chemotherapy: Secondary | ICD-10-CM

## 2014-05-01 DIAGNOSIS — C8510 Unspecified B-cell lymphoma, unspecified site: Secondary | ICD-10-CM

## 2014-05-01 DIAGNOSIS — D6481 Anemia due to antineoplastic chemotherapy: Secondary | ICD-10-CM | POA: Diagnosis not present

## 2014-05-01 LAB — COMPREHENSIVE METABOLIC PANEL (CC13)
ALT: 21 U/L (ref 0–55)
AST: 30 U/L (ref 5–34)
Albumin: 3.2 g/dL — ABNORMAL LOW (ref 3.5–5.0)
Alkaline Phosphatase: 59 U/L (ref 40–150)
Anion Gap: 10 mEq/L (ref 3–11)
BILIRUBIN TOTAL: 0.27 mg/dL (ref 0.20–1.20)
BUN: 7.1 mg/dL (ref 7.0–26.0)
CO2: 20 meq/L — AB (ref 22–29)
CREATININE: 0.8 mg/dL (ref 0.6–1.1)
Calcium: 9 mg/dL (ref 8.4–10.4)
Chloride: 106 mEq/L (ref 98–109)
GLUCOSE: 98 mg/dL (ref 70–140)
Potassium: 4.2 mEq/L (ref 3.5–5.1)
Sodium: 137 mEq/L (ref 136–145)
Total Protein: 6.7 g/dL (ref 6.4–8.3)

## 2014-05-01 LAB — CBC WITH DIFFERENTIAL/PLATELET
BASO%: 1.5 % (ref 0.0–2.0)
Basophils Absolute: 0 10*3/uL (ref 0.0–0.1)
EOS ABS: 0 10*3/uL (ref 0.0–0.5)
EOS%: 0.5 % (ref 0.0–7.0)
HEMATOCRIT: 31.9 % — AB (ref 34.8–46.6)
HGB: 10.1 g/dL — ABNORMAL LOW (ref 11.6–15.9)
LYMPH%: 30.6 % (ref 14.0–49.7)
MCH: 26.7 pg (ref 25.1–34.0)
MCHC: 31.7 g/dL (ref 31.5–36.0)
MCV: 84.5 fL (ref 79.5–101.0)
MONO#: 0.6 10*3/uL (ref 0.1–0.9)
MONO%: 23 % — AB (ref 0.0–14.0)
NEUT%: 44.4 % (ref 38.4–76.8)
NEUTROS ABS: 1.1 10*3/uL — AB (ref 1.5–6.5)
PLATELETS: 187 10*3/uL (ref 145–400)
RBC: 3.78 10*6/uL (ref 3.70–5.45)
RDW: 19.5 % — ABNORMAL HIGH (ref 11.2–14.5)
WBC: 2.4 10*3/uL — AB (ref 3.9–10.3)
lymph#: 0.7 10*3/uL — ABNORMAL LOW (ref 0.9–3.3)

## 2014-05-01 MED ORDER — LORAZEPAM 2 MG/ML IJ SOLN
INTRAMUSCULAR | Status: AC
  Filled 2014-05-01: qty 1

## 2014-05-01 MED ORDER — HEPARIN SOD (PORK) LOCK FLUSH 100 UNIT/ML IV SOLN
500.0000 [IU] | Freq: Once | INTRAVENOUS | Status: AC | PRN
  Administered 2014-05-01: 500 [IU]
  Filled 2014-05-01: qty 5

## 2014-05-01 MED ORDER — SODIUM CHLORIDE 0.9 % IV SOLN
750.0000 mg/m2 | Freq: Once | INTRAVENOUS | Status: AC
  Administered 2014-05-01: 1640 mg via INTRAVENOUS
  Filled 2014-05-01: qty 82

## 2014-05-01 MED ORDER — PALONOSETRON HCL INJECTION 0.25 MG/5ML
INTRAVENOUS | Status: AC
  Filled 2014-05-01: qty 5

## 2014-05-01 MED ORDER — RITUXIMAB CHEMO INJECTION 500 MG/50ML
375.0000 mg/m2 | Freq: Once | INTRAVENOUS | Status: AC
  Administered 2014-05-01: 800 mg via INTRAVENOUS
  Filled 2014-05-01: qty 80

## 2014-05-01 MED ORDER — VINCRISTINE SULFATE CHEMO INJECTION 1 MG/ML
1.5000 mg | Freq: Once | INTRAVENOUS | Status: AC
  Administered 2014-05-01: 1.5 mg via INTRAVENOUS
  Filled 2014-05-01: qty 1.5

## 2014-05-01 MED ORDER — ACETAMINOPHEN 325 MG PO TABS
ORAL_TABLET | ORAL | Status: AC
  Filled 2014-05-01: qty 2

## 2014-05-01 MED ORDER — DIPHENHYDRAMINE HCL 25 MG PO CAPS
ORAL_CAPSULE | ORAL | Status: AC
  Filled 2014-05-01: qty 2

## 2014-05-01 MED ORDER — LORAZEPAM 2 MG/ML IJ SOLN
1.0000 mg | Freq: Once | INTRAMUSCULAR | Status: AC
  Administered 2014-05-01: 1 mg via INTRAVENOUS

## 2014-05-01 MED ORDER — PALONOSETRON HCL INJECTION 0.25 MG/5ML
0.2500 mg | Freq: Once | INTRAVENOUS | Status: AC
  Administered 2014-05-01: 0.25 mg via INTRAVENOUS

## 2014-05-01 MED ORDER — DOXORUBICIN HCL CHEMO IV INJECTION 2 MG/ML
50.0000 mg/m2 | Freq: Once | INTRAVENOUS | Status: AC
  Administered 2014-05-01: 110 mg via INTRAVENOUS
  Filled 2014-05-01: qty 55

## 2014-05-01 MED ORDER — SODIUM CHLORIDE 0.9 % IJ SOLN
10.0000 mL | INTRAMUSCULAR | Status: DC | PRN
  Administered 2014-05-01: 10 mL
  Filled 2014-05-01: qty 10

## 2014-05-01 MED ORDER — SODIUM CHLORIDE 0.9 % IV SOLN
Freq: Once | INTRAVENOUS | Status: AC
  Administered 2014-05-01: 11:00:00 via INTRAVENOUS

## 2014-05-01 MED ORDER — SODIUM CHLORIDE 0.9 % IV SOLN
10.0000 mg | Freq: Once | INTRAVENOUS | Status: AC
  Administered 2014-05-01: 10 mg via INTRAVENOUS
  Filled 2014-05-01: qty 1

## 2014-05-01 MED ORDER — DIPHENHYDRAMINE HCL 25 MG PO CAPS
50.0000 mg | ORAL_CAPSULE | Freq: Once | ORAL | Status: AC
  Administered 2014-05-01: 50 mg via ORAL

## 2014-05-01 MED ORDER — ACETAMINOPHEN 325 MG PO TABS
650.0000 mg | ORAL_TABLET | Freq: Once | ORAL | Status: AC
  Administered 2014-05-01: 650 mg via ORAL

## 2014-05-01 NOTE — Progress Notes (Signed)
Per Dr. Lindi Adie, ok to treat with [place] 1100.

## 2014-05-01 NOTE — Patient Instructions (Signed)
[facility] Discharge Instructions for Patients Receiving Chemotherapy  Today you received the following chemotherapy agents: Adriamycin, Vincristine, Cytoxan, and Rituxan.   To help prevent nausea and vomiting after your treatment, we encourage you to take your nausea medication as directed.    If you develop nausea and vomiting that is not controlled by your nausea medication, call the clinic.   BELOW ARE SYMPTOMS THAT SHOULD BE REPORTED IMMEDIATELY:  *FEVER GREATER THAN 100.5 F  *CHILLS WITH OR WITHOUT FEVER  NAUSEA AND VOMITING THAT IS NOT CONTROLLED WITH YOUR NAUSEA MEDICATION  *UNUSUAL SHORTNESS OF BREATH  *UNUSUAL BRUISING OR BLEEDING  TENDERNESS IN MOUTH AND THROAT WITH OR WITHOUT PRESENCE OF ULCERS  *URINARY PROBLEMS  *BOWEL PROBLEMS  UNUSUAL RASH Items with * indicate a potential emergency and should be followed up as soon as possible.  Feel free to call the clinic you have any questions or concerns. The clinic phone number is [phone].  Please show the CHEMO ALERT CARD at check-in to the Emergency Department and triage nurse.   

## 2014-05-01 NOTE — Progress Notes (Signed)
Patient Care Team: [name], MD as PCP - General (Family Medicine)  DIAGNOSIS: No matching staging information was found for the patient.  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 PET activity in the bone marrow and spleen thought to be related to chemotherapy rather than disease. (Stage 2B)    CHIEF COMPLIANT: Follow-up for cycle 6R CHOP  INTERVAL HISTORY: Rachael West is a 59 year old with above-mentioned history of bulky lymphadenopathy from aggressive large B-cell lymphoma who is here to receive her sixth cycle of R CHOP chemotherapy. She done extremely well from the treatment standpoint did have mild neuropathy for which dose reduction was done for vincristine. Her neuropathy is stable. She does complain of fatigue. Denies any nausea or vomiting. Does not have any appetite cause of lack of taste. But her cervical lymph nodes have all completely resolved.  REVIEW OF SYSTEMS:   Constitutional: Denies fevers, chills or abnormal weight loss Eyes: Denies blurriness of vision Ears, nose, mouth, throat, and face: Denies mucositis or sore throat Respiratory: Denies cough, dyspnea or wheezes Cardiovascular: Denies palpitation, chest discomfort or lower extremity swelling Gastrointestinal:  Constipation uses Mira lax Skin: Denies abnormal skin rashes Lymphatics: Denies new lymphadenopathy or easy bruising Neurological:Denies numbness, tingling or new weaknesses Behavioral/Psych: Mood is stable, no new changes    All other systems were reviewed with the patient and are negative.  I have reviewed the past medical history, past surgical history, social history and family history with the patient and they are unchanged from previous note.  ALLERGIES:  is allergic to latex; penicillins; codeine; and sulfa antibiotics.  MEDICATIONS:  Current Outpatient Prescriptions  Medication Sig Dispense Refill  . albuterol (PROVENTIL) (2.5 MG/3ML) 0.083% nebulizer solution Take 2.5 mg by nebulization every 6 (six) hours as needed for wheezing.     Marland Kitchen allopurinol (ZYLOPRIM) 300 MG tablet TAKE 1 TABLET (300 MG TOTAL) BY MOUTH DAILY. 30 tablet 1  . ALPRAZolam (XANAX) 0.25 MG tablet   1  . amitriptyline (ELAVIL) 25 MG tablet Take 25 mg by mouth at bedtime.    Marland Kitchen amLODipine (NORVASC) 5 MG tablet Take 5 mg by mouth every morning.     . bimatoprost (LUMIGAN) 0.01 % SOLN Place 1 drop into both eyes at bedtime.    . fluticasone-salmeterol (ADVAIR HFA) 115-21 MCG/ACT inhaler Inhale 2 puffs into the lungs 2 (two) times daily.      . hydrOXYzine (VISTARIL) 50 MG capsule Take 50 mg by mouth at bedtime.    . lidocaine-prilocaine (EMLA) cream Apply to affected area once 30 g 3  . lisinopril (PRINIVIL,ZESTRIL) 5 MG tablet Take 5 mg by mouth every morning.     Marland Kitchen LORazepam (ATIVAN) 0.5 MG tablet Take 1 tablet (0.5 mg total) by mouth every 6 (six) hours as needed (Nausea or vomiting). 30 tablet 0  . meloxicam (MOBIC) 15 MG tablet Take 15 mg by mouth daily.      Marland Kitchen omeprazole (PRILOSEC) 40 MG capsule   5  . ondansetron (ZOFRAN) 8 MG tablet Take 1 tablet (8 mg total) by mouth  2 (two) times daily. Start the day after chemo for 3 days. Then as needed for nausea or vomiting. 30 tablet 1  . paliperidone (INVEGA) 6 MG 24 hr tablet Take 6 mg by mouth every morning.    . pantoprazole (PROTONIX) 40 MG tablet Take 40 mg by mouth daily.      . polyethylene glycol powder (GLYCOLAX/MIRALAX) powder   5  . prazosin (MINIPRESS) 1 MG capsule Take 1 mg  by mouth at bedtime.    . predniSONE (DELTASONE) 20 MG tablet Take 3 tablets (60 mg total) by mouth daily. Take on days 1-5 of chemotherapy. 15 tablet 5  . prochlorperazine (COMPAZINE) 10 MG tablet Take 1 tablet (10 mg total) by mouth every 6 (six) hours as needed (Nausea or vomiting). 30 tablet 6  . sertraline (ZOLOFT) 100 MG tablet Take 200 mg by mouth every morning.    . traMADol (ULTRAM) 50 MG tablet Take 50 mg by mouth 3 (three) times daily.    . traZODone (DESYREL) 100 MG tablet Take 100 mg by mouth at bedtime.      . triamcinolone (NASACORT) 55 MCG/ACT nasal inhaler Place 2 sprays into the nose daily.       No current facility-administered medications for this visit.    PHYSICAL EXAMINATION: ECOG PERFORMANCE STATUS: 1 - Symptomatic but completely ambulatory  Filed Vitals:   05/01/14 0859  BP: 110/70  Pulse: 108  Temp: 98.1 F (36.7 C)  Resp: 18   Filed Weights   05/01/14 0859  Weight: 202 lb 12.8 oz (91.989 kg)    GENERAL:alert, no distress and comfortable SKIN: skin color, texture, turgor are normal, no rashes or significant lesions EYES: normal, Conjunctiva are pink and non-injected, sclera clear OROPHARYNX:no exudate, no erythema and lips, buccal mucosa, and tongue normal  NECK: supple, thyroid normal size, non-tender, without nodularity LYMPH:  no palpable lymphadenopathy in the cervical, axillary or inguinal LUNGS: clear to auscultation and percussion with normal breathing effort HEART: regular rate & rhythm and no murmurs and no lower extremity edema ABDOMEN:abdomen soft, non-tender and normal bowel sounds Musculoskeletal:no cyanosis of digits and no clubbing  NEURO: alert & oriented x 3 with fluent speech, no focal motor/sensory deficits LABORATORY DATA:  I have reviewed the data as listed   Chemistry      Component Value Date/Time   NA 138 04/10/2014 0828   NA 139 10/31/2010 2345   K 4.2 04/10/2014 0828   K 4.1 10/31/2010 2345   CL 103 10/31/2010 2345    CO2 21* 04/10/2014 0828   CO2 25 07/13/2009 2121   BUN 8.8 04/10/2014 0828   BUN 7 10/31/2010 2345   CREATININE 0.8 04/10/2014 0828   CREATININE 0.80 10/31/2010 2345      Component Value Date/Time   CALCIUM 9.1 04/10/2014 0828   CALCIUM 9.1 07/13/2009 2121   ALKPHOS 68 04/10/2014 0828   ALKPHOS 77 10/31/2010 2335   AST 28 04/10/2014 0828   AST 35 10/31/2010 2335   ALT 23 04/10/2014 0828   ALT 30 10/31/2010 2335   BILITOT 0.49 04/10/2014 0828   BILITOT 0.5 10/31/2010 2335       Lab Results  Component Value Date   WBC 2.4* 05/01/2014   HGB 10.1* 05/01/2014   HCT 31.9* 05/01/2014   MCV 84.5 05/01/2014   PLT 187 05/01/2014   NEUTROABS 1.1* 05/01/2014    ASSESSMENT & PLAN:  Large B-cell lymphoma Aggressive large B-cell lymphoma: CD5 and 10 negative, CD20 positive BCL 2 and  BCL 6 positive: Differential diagnosis aggressive large B-cell lymphoma versus B-cell lymphoma unclassified, Ki-67 100%, bulky lymphadenopathy supraclavicular and axillary lymph nodes, bone marrow negative, stage IIB  Treatment plan: R CHOP chemotherapy started 01/16/2014 plan is to give 6 cycles  Today is cycle 6 day 1 RCHOP (last cycle)  Chemotherapy toxicities:  1. Alopecia 2. Loss of taste 3. Anemia grade 1 due to chemotherapy 4. 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 5. Neutropenia [facility] 1100 05/01/2014: I discussed with her that even though her [place] is 1100 that a lot of monocytes and she should be able to finish her last cycle of chemotherapy. I recommended to go ahead and treat her today.  Radiology review: CT scans done 03/12/2014 midpoint of treatment revealed decrease in size of lymph nodes from 2.7 x 4.4 cm down to 1.8 x 3.1 cm suggesting good response to chemotherapy. CT neck reveals improvement in lymphadenopathy in the supraclavicular area largest was 3.3 x 2.5 cm and now it is 2.6 x 1.6 cm, another lymph node supraclavicular area  measures 1.5 x 1.4 cm previously 2.8 x 2.6 cm  Monitoring closely for chemotherapy toxicities Continue with tumor lysis prophylaxis with allopurinol  Hepatitis C: Seen infectious diseases, and monitoring her liver function tests which have been stable.  Plan: 1. PET/CT scan to assess response to chemotherapy 2. Follow-up in 4 weeks to discuss the result.    Orders Placed This Encounter  Procedures  . NM PET Image Restag (PS) Skull Base To Thigh    Standing Status: Future     Number of Occurrences:      Standing Expiration Date: 05/01/2015    Order Specific Question:  Reason for Exam (SYMPTOM  OR DIAGNOSIS REQUIRED)    Answer:  Restaging for Lymphoma after completion of chemo    Order Specific Question:  Is the patient pregnant?    Answer:  No    Order Specific Question:  Preferred imaging location?    Answer:  [facility]  . CBC with Differential    Standing Status: Future     Number of Occurrences:      Standing Expiration Date: 05/01/2015  . Comprehensive metabolic panel (Cmet) - [facility]    Standing Status: Future     Number of Occurrences:      Standing Expiration Date: 05/01/2015  . Lactate dehydrogenase (LDH) - [facility]    Standing Status: Future     Number of Occurrences:      Standing Expiration Date: 05/01/2015   The patient has a good understanding of the overall plan. she agrees with it. She will call with any problems that may develop before her next visit here.   Rulon Eisenmenger, MD

## 2014-05-01 NOTE — Telephone Encounter (Signed)
per pof ot sch pt appt-adv pt [facility] will call to sch PET-pt to get updated sch infusion

## 2014-05-01 NOTE — Progress Notes (Signed)
Patient vomited a large amount while decadron infusing.   She feels some better but unable to take her po benadryl and tylenol for premeds for rituxan.  Discussed with Gerald Stabs Elder/Pharmacist.  Patient takes ativan at home with good response.  VO from Dr. Lindi Adie for 1mg  ativan IV. 1320 ativan 1mg  IV given. 1340 Patient reports not much improvement in her nausea. 1345  Discussed essential oils with patient and she is agreeable to try.   Patient choose peppermine (declined lavender, ginger-orange.) 1350  Patient reports improvement in her nausea and she is willing to try the tylenol and benadryl. Patient reports continued improvement in her nausea. 1620:  Patient given refill of peppermint oil on gauze to take home.

## 2014-05-04 ENCOUNTER — Ambulatory Visit (HOSPITAL_BASED_OUTPATIENT_CLINIC_OR_DEPARTMENT_OTHER): Payer: Medicare HMO

## 2014-05-04 VITALS — BP 112/65 | HR 100 | Temp 98.4°F

## 2014-05-04 DIAGNOSIS — C8510 Unspecified B-cell lymphoma, unspecified site: Secondary | ICD-10-CM

## 2014-05-04 DIAGNOSIS — C8334 Diffuse large B-cell lymphoma, lymph nodes of axilla and upper limb: Secondary | ICD-10-CM

## 2014-05-04 DIAGNOSIS — Z5189 Encounter for other specified aftercare: Secondary | ICD-10-CM

## 2014-05-04 MED ORDER — PEGFILGRASTIM INJECTION 6 MG/0.6ML ~~LOC~~
6.0000 mg | PREFILLED_SYRINGE | Freq: Once | SUBCUTANEOUS | Status: AC
  Administered 2014-05-04: 6 mg via SUBCUTANEOUS
  Filled 2014-05-04: qty 0.6

## 2014-05-09 ENCOUNTER — Other Ambulatory Visit: Payer: Self-pay | Admitting: Hematology and Oncology

## 2014-05-11 ENCOUNTER — Other Ambulatory Visit: Payer: Self-pay | Admitting: *Deleted

## 2014-05-11 DIAGNOSIS — C8580 Other specified types of non-Hodgkin lymphoma, unspecified site: Secondary | ICD-10-CM

## 2014-05-11 MED ORDER — ALLOPURINOL 300 MG PO TABS: ORAL_TABLET | ORAL | Status: DC

## 2014-05-22 ENCOUNTER — Ambulatory Visit (HOSPITAL_COMMUNITY): Payer: Medicare Other

## 2014-05-25 ENCOUNTER — Telehealth: Payer: Self-pay | Admitting: Hematology and Oncology

## 2014-05-25 NOTE — Telephone Encounter (Signed)
Patient called in as she had to reschedule her scan,her appointment has been rescheduled  anne

## 2014-05-29 ENCOUNTER — Encounter (HOSPITAL_COMMUNITY)
Admission: RE | Admit: 2014-05-29 | Discharge: 2014-05-29 | Disposition: A | Discharge status: Home / Self Care | Payer: Medicare Other | Source: Ambulatory Visit | Attending: Hematology and Oncology | Admitting: Hematology and Oncology

## 2014-05-29 ENCOUNTER — Other Ambulatory Visit: Payer: Medicare HMO

## 2014-05-29 ENCOUNTER — Ambulatory Visit: Payer: Medicare HMO | Admitting: Hematology and Oncology

## 2014-05-29 ENCOUNTER — Other Ambulatory Visit: Payer: Self-pay | Admitting: Hematology and Oncology

## 2014-05-29 DIAGNOSIS — Z79899 Other long term (current) drug therapy: Secondary | ICD-10-CM | POA: Insufficient documentation

## 2014-05-29 DIAGNOSIS — C8510 Unspecified B-cell lymphoma, unspecified site: Secondary | ICD-10-CM | POA: Insufficient documentation

## 2014-05-29 LAB — GLUCOSE, CAPILLARY: GLUCOSE-CAPILLARY: 104 mg/dL — AB (ref 65–99)

## 2014-05-29 MED ORDER — FLUDEOXYGLUCOSE F - 18 (FDG) INJECTION
10.9000 | Freq: Once | INTRAVENOUS | Status: AC | PRN
  Administered 2014-05-29: 10.9 via INTRAVENOUS

## 2014-05-31 NOTE — Assessment & Plan Note (Signed)
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 Neutropenia   Plan: Repeat CTs in 3 months

## 2014-06-01 ENCOUNTER — Other Ambulatory Visit (HOSPITAL_BASED_OUTPATIENT_CLINIC_OR_DEPARTMENT_OTHER): Payer: Medicare Other

## 2014-06-01 ENCOUNTER — Ambulatory Visit (HOSPITAL_BASED_OUTPATIENT_CLINIC_OR_DEPARTMENT_OTHER): Payer: Medicare Other | Admitting: Hematology and Oncology

## 2014-06-01 ENCOUNTER — Telehealth: Payer: Self-pay | Admitting: Hematology and Oncology

## 2014-06-01 VITALS — BP 98/60 | HR 98 | Temp 98.0°F | Resp 18 | Ht 65.5 in | Wt 191.0 lb

## 2014-06-01 DIAGNOSIS — C8334 Diffuse large B-cell lymphoma, lymph nodes of axilla and upper limb: Secondary | ICD-10-CM

## 2014-06-01 DIAGNOSIS — C8510 Unspecified B-cell lymphoma, unspecified site: Secondary | ICD-10-CM

## 2014-06-01 LAB — CBC WITH DIFFERENTIAL/PLATELET
BASO%: 1.3 % (ref 0.0–2.0)
Basophils Absolute: 0 10*3/uL (ref 0.0–0.1)
EOS ABS: 0.1 10*3/uL (ref 0.0–0.5)
EOS%: 2.3 % (ref 0.0–7.0)
HCT: 34.8 % (ref 34.8–46.6)
HEMOGLOBIN: 11.2 g/dL — AB (ref 11.6–15.9)
LYMPH#: 0.6 10*3/uL — AB (ref 0.9–3.3)
LYMPH%: 21.3 % (ref 14.0–49.7)
MCH: 27.7 pg (ref 25.1–34.0)
MCHC: 32.1 g/dL (ref 31.5–36.0)
MCV: 86 fL (ref 79.5–101.0)
MONO#: 0.6 10*3/uL (ref 0.1–0.9)
MONO%: 19 % — AB (ref 0.0–14.0)
NEUT#: 1.6 10*3/uL (ref 1.5–6.5)
NEUT%: 56.1 % (ref 38.4–76.8)
PLATELETS: 186 10*3/uL (ref 145–400)
RBC: 4.04 10*6/uL (ref 3.70–5.45)
RDW: 17.7 % — ABNORMAL HIGH (ref 11.2–14.5)
WBC: 2.9 10*3/uL — ABNORMAL LOW (ref 3.9–10.3)

## 2014-06-01 LAB — COMPREHENSIVE METABOLIC PANEL (CC13)
ALT: 25 U/L (ref 0–55)
AST: 35 U/L — ABNORMAL HIGH (ref 5–34)
Albumin: 3.3 g/dL — ABNORMAL LOW (ref 3.5–5.0)
Alkaline Phosphatase: 57 U/L (ref 40–150)
Anion Gap: 10 meq/L (ref 3–11)
BUN: 4.1 mg/dL — ABNORMAL LOW (ref 7.0–26.0)
CO2: 22 meq/L (ref 22–29)
Calcium: 8.7 mg/dL (ref 8.4–10.4)
Chloride: 105 meq/L (ref 98–109)
Creatinine: 0.7 mg/dL (ref 0.6–1.1)
EGFR: >90 ml/min/1.73 m2 (ref >90)
Glucose: 80 mg/dL (ref 70–140)
Potassium: 4.2 meq/L (ref 3.5–5.1)
Sodium: 137 meq/L (ref 136–145)
Total Bilirubin: 0.41 mg/dL (ref 0.20–1.20)
Total Protein: 7.1 g/dL (ref 6.4–8.3)

## 2014-06-01 LAB — LACTATE DEHYDROGENASE (CC13): LDH: 294 U/L — ABNORMAL HIGH (ref 125–245)

## 2014-06-01 NOTE — Progress Notes (Signed)
Patient Care Team: [name], MD as PCP - General (Family Medicine)  DIAGNOSIS: No matching staging information was found for the patient.  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 PET activity in the bone marrow and spleen thought to be related to chemotherapy rather than disease. (Stage 2B)   05/29/2014 PET scan Significant improvement, with reduction in size and activity of the left supraclavicular and left axillary lymph node ; reduced activity in the normal size spleen; and resolution of the diffuse hypermetabolic marrow activity.    CHIEF COMPLIANT: Follow-up after PET CT scan for B-cell lymphoma after chemotherapy  INTERVAL HISTORY: Rachael West is a [age]-year-old above-mentioned history of aggressive B-cell lymphoma that was treated with 6 cycles of R CHOP chemotherapy. She is here to discuss the results of the PET CT scan. 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 She denies any nausea vomiting but she had lost 10 pounds since last visit.  REVIEW OF SYSTEMS:   Constitutional: Denies fevers, chills or abnormal weight loss Eyes: Denies blurriness of vision Ears, nose, mouth, throat, and face: Denies mucositis or sore throat Respiratory: Denies cough, dyspnea or wheezes Cardiovascular: Denies palpitation, chest discomfort or lower extremity swelling Gastrointestinal:  Denies nausea, heartburn or change in bowel habits Skin: Denies abnormal skin rashes Lymphatics: Denies new  lymphadenopathy or easy bruising Neurological: Neuropathy in fingers and toes Behavioral/Psych: Mood is stable, no new changes   All other systems were reviewed with the patient and are negative.  I have reviewed the past medical history, past surgical history, social history and family history with the patient and they are unchanged from previous note.  ALLERGIES:  is allergic to latex; penicillins; codeine; and sulfa antibiotics.  MEDICATIONS:  Current Outpatient Prescriptions  Medication Sig Dispense Refill  . albuterol (PROVENTIL) (2.5 MG/3ML) 0.083% nebulizer solution Take 2.5 mg by nebulization every 6 (six) hours as needed for wheezing.     Marland Kitchen allopurinol (ZYLOPRIM) 300 MG tablet TAKE 1 TABLET (300 MG TOTAL) BY MOUTH DAILY. 30 tablet 1  . allopurinol (ZYLOPRIM) 300 MG tablet TAKE 1 TABLET (300 MG TOTAL) BY MOUTH DAILY. 30 tablet 1  . ALPRAZolam (XANAX) 0.25 MG tablet   1  . amitriptyline (ELAVIL) 25 MG tablet Take 25 mg by mouth at bedtime.    Marland Kitchen amLODipine (NORVASC) 5 MG tablet Take 5 mg by mouth every morning.     . bimatoprost (LUMIGAN) 0.01 % SOLN Place 1 drop into both eyes at bedtime.    . fluticasone-salmeterol (ADVAIR HFA) 115-21 MCG/ACT inhaler Inhale 2 puffs into the lungs 2 (two) times daily.      . hydrOXYzine (VISTARIL) 50 MG capsule Take 50 mg by mouth at bedtime.    . lidocaine-prilocaine (EMLA) cream Apply to affected area once 30 g 3  . lisinopril (PRINIVIL,ZESTRIL) 5 MG tablet Take 5 mg by mouth every morning.     Marland Kitchen LORazepam (ATIVAN) 0.5 MG tablet Take 1 tablet (0.5 mg total) by mouth every 6 (six) hours as needed (Nausea or vomiting).  30 tablet 0  . meloxicam (MOBIC) 15 MG tablet Take 15 mg by mouth daily.      Marland Kitchen omeprazole (PRILOSEC) 40 MG capsule   5  . ondansetron (ZOFRAN) 8 MG tablet Take 1 tablet (8 mg total) by mouth 2 (two) times daily. Start the day after chemo for 3 days. Then as needed for nausea or vomiting. 30 tablet 1  . paliperidone (INVEGA) 6 MG 24  hr tablet Take 6 mg by mouth every morning.    . pantoprazole (PROTONIX) 40 MG tablet Take 40 mg by mouth daily.      . polyethylene glycol powder (GLYCOLAX/MIRALAX) powder   5  . prazosin (MINIPRESS) 1 MG capsule Take 1 mg by mouth at bedtime.    . predniSONE (DELTASONE) 20 MG tablet Take 3 tablets (60 mg total) by mouth daily. Take on days 1-5 of chemotherapy. 15 tablet 5  . prochlorperazine (COMPAZINE) 10 MG tablet Take 1 tablet (10 mg total) by mouth every 6 (six) hours as needed (Nausea or vomiting). 30 tablet 6  . sertraline (ZOLOFT) 100 MG tablet Take 200 mg by mouth every morning.    . traMADol (ULTRAM) 50 MG tablet Take 50 mg by mouth 3 (three) times daily.    . traZODone (DESYREL) 100 MG tablet Take 100 mg by mouth at bedtime.      . triamcinolone (NASACORT) 55 MCG/ACT nasal inhaler Place 2 sprays into the nose daily.       No current facility-administered medications for this visit.    PHYSICAL EXAMINATION: ECOG PERFORMANCE STATUS: 1 - Symptomatic but completely ambulatory  Filed Vitals:   06/01/14 1124  BP: 98/60  Pulse: 98  Temp: 98 F (36.7 C)  Resp: 18   Filed Weights   06/01/14 1124  Weight: 191 lb (86.637 kg)    GENERAL:alert, no distress and comfortable SKIN: skin color, texture, turgor are normal, no rashes or significant lesions EYES: normal, Conjunctiva are pink and non-injected, sclera clear OROPHARYNX:no exudate, no erythema and lips, buccal mucosa, and tongue normal  NECK: supple, thyroid normal size, non-tender, without nodularity LYMPH:  no palpable lymphadenopathy in the cervical, axillary or inguinal LUNGS: clear to auscultation and percussion with normal breathing effort HEART: regular rate & rhythm and no murmurs and no lower extremity edema ABDOMEN:abdomen soft, non-tender and normal bowel sounds Musculoskeletal:no cyanosis of digits and no clubbing  NEURO: Neuropathy grade 1-2  LABORATORY DATA:  I have reviewed the data as listed   Chemistry       Component Value Date/Time   NA 137 05/01/2014 0845   NA 139 10/31/2010 2345   K 4.2 05/01/2014 0845   K 4.1 10/31/2010 2345   CL 103 10/31/2010 2345   CO2 20* 05/01/2014 0845   CO2 25 07/13/2009 2121   BUN 7.1 05/01/2014 0845   BUN 7 10/31/2010 2345   CREATININE 0.8 05/01/2014 0845   CREATININE 0.80 10/31/2010 2345      Component Value Date/Time   CALCIUM 9.0 05/01/2014 0845   CALCIUM 9.1 07/13/2009 2121   ALKPHOS 59 05/01/2014 0845   ALKPHOS 77 10/31/2010 2335   AST 30 05/01/2014 0845   AST 35 10/31/2010 2335   ALT 21 05/01/2014 0845   ALT 30 10/31/2010 2335   BILITOT 0.27 05/01/2014 0845   BILITOT 0.5 10/31/2010 2335       Lab Results  Component Value Date   WBC 2.9* 06/01/2014   HGB 11.2* 06/01/2014   HCT 34.8 06/01/2014   MCV  86.0 06/01/2014   PLT 186 06/01/2014   NEUTROABS 1.6 06/01/2014     RADIOGRAPHIC STUDIES: I have personally reviewed the radiology reports and agreed with their findings. PET/CT scan showed remarkable response to chemotherapy that appears to be slight residual abnormality with mild SUV uptake. I am not sure that address are present residual cancer. I would like to follow this with a 3 month CT  ASSESSMENT & PLAN:  Large B-cell lymphoma Aggressive large B-cell lymphoma: CD5 and 10 negative, CD20 positive BCL 2 and BCL 6 positive: Differential diagnosis aggressive large B-cell lymphoma versus B-cell lymphoma unclassified, Ki-67 100%, bulky lymphadenopathy supraclavicular and axillary lymph nodes, bone marrow negative, stage IIB  Treatment Summary: R CHOP chemotherapy started 01/16/2014 completed 05/01/14 PET-CT 05/29/14: Near CR Chemo Toxicities: Alopecia , Loss of taste, Anemia grade 1 due to chemotherapy, 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: Reduced dosage of vincristine to 1.5 mg starting with cycle 4. Neutropenia   Plan: Repeat CTs in 3 months Follow-up after scans with blood work    Orders Placed This Encounter  Procedures  .  CT Abdomen Pelvis W Contrast    Standing Status: Future     Number of Occurrences:      Standing Expiration Date: 06/01/2015    Order Specific Question:  Reason for Exam (SYMPTOM  OR DIAGNOSIS REQUIRED)    Answer:  Large B cell lymphoma restaging    Order Specific Question:  Is the patient pregnant?    Answer:  No    Order Specific Question:  Preferred imaging location?    Answer:  [facility]  . CT Chest W Contrast    Standing Status: Future     Number of Occurrences:      Standing Expiration Date: 06/01/2015    Order Specific Question:  Reason for Exam (SYMPTOM  OR DIAGNOSIS REQUIRED)    Answer:  Large B cell lymphoma restaging    Order Specific Question:  Is the patient pregnant?    Answer:  No    Order Specific Question:  Preferred imaging location?    Answer:  [facility]  . CBC with Differential    Standing Status: Future     Number of Occurrences:      Standing Expiration Date: 06/01/2015  . Comprehensive metabolic panel (Cmet) - [facility]    Standing Status: Future     Number of Occurrences:      Standing Expiration Date: 06/01/2015  . Lactate dehydrogenase (LDH) - [facility]    Standing Status: Future     Number of Occurrences:      Standing Expiration Date: 06/01/2015  . Ambulatory referral to Nutrition and Diabetic E    Referral Priority:  Routine    Referral Type:  Consultation    Referral Reason:  Specialty Services Required    Number of Visits Requested:  1   The patient has a good understanding of the overall plan. she agrees with it. she will call with any problems that may develop before the next visit here.   Rulon Eisenmenger, MD

## 2014-06-01 NOTE — Telephone Encounter (Signed)
Chart reviewed.  OV today.

## 2014-06-01 NOTE — Telephone Encounter (Signed)
Gave avs & calendar for May & August. Also gave CT scan contrast.

## 2014-06-01 NOTE — Addendum Note (Signed)
Addended by: Prentiss Bells on: 06/01/2014 12:14 PM   Modules accepted: Medications

## 2014-06-09 ENCOUNTER — Ambulatory Visit: Payer: Medicare Other | Admitting: Nutrition

## 2014-06-09 NOTE — Progress Notes (Signed)
59 year old female with history of aggressive B-call lymphoma that was treated with 6 cycles of R CHOP chemotherapy who is a patient of Dr. Lindi Adie.   Past medical history includes hypertension, asthma, GERD, arthritis, anemia, anxiety and hepatitis.   Medications include proventil, zyloprim, xanax, norvasc, elavil, lumigan, advair, vistaril, emlo, lisinopril, ativan, mobic, prilosec, zofran, invega, protonix, miralax, compazine, prednizone, zoloft, ultram, desyrel, nasacort  Labs include BUN 4.1, Albumin 3.3  Height: 65.5 inches Weight: 191 lbs (5/24) Usual body weight: 220 lbs in April 2016 BMI 31.3  Estimated needs: 1750-2000 kcal 75-90 g protein 2.0 L fluid per day  Patient with poor po intake and weight loss.  Was drinking nutritional supplements (Ensure or Boost regular 1-2 per day), but ran out. Pt was experiencing nausea which has improved with medication.  Pt with complains of dry mouth and foods getting stuck in throat unless she drinks liquids with bites.  Complains of constipation. Prescribed miralax and sienna which she says do not help.  Pt says that she drinks lots of fluids, especially water.   Nutrition Diagnosis: Pt with severe malnutrition related to chronic illness as evidenced by 13% weight loss in <3 months and reported intake <75% of estimated needs for >1 month.   Intervention: Educated pt to increase frequency of meals and snacks to 5-6 times per day.  Recommended that pt consume nutritional supplements 2-3 times per day (Ensure Complete or Boost Plus). Provided samples of Boost Plus and Ensure Complete. Provided coupons.  Continue medications as prescribed by MD for nausea and constipation. Recommended addition of 4 oz of prune juice daily.  Pt encouraged to consume soft, moist foods and liquids with bites to improve dry mouth. Handouts provided. Teach-back used.  Encouraged fluids.   Monitoring, evaluation, and goals: Patient will increase overall intake  to minimize further weight loss.   Laurette Schimke MS, RD, LDN

## 2014-07-01 ENCOUNTER — Other Ambulatory Visit: Payer: Self-pay | Admitting: Hematology and Oncology

## 2014-07-01 NOTE — Telephone Encounter (Signed)
Per MD patient no longer needs to take.

## 2014-07-16 ENCOUNTER — Ambulatory Visit (HOSPITAL_COMMUNITY): Payer: Medicare Other

## 2014-07-21 ENCOUNTER — Ambulatory Visit (HOSPITAL_COMMUNITY)
Admission: RE | Admit: 2014-07-21 | Discharge: 2014-07-21 | Disposition: A | Discharge status: Home / Self Care | Payer: Medicare Other | Source: Ambulatory Visit | Attending: Internal Medicine | Admitting: Internal Medicine

## 2014-07-21 DIAGNOSIS — B182 Chronic viral hepatitis C: Secondary | ICD-10-CM

## 2014-07-21 DIAGNOSIS — K760 Fatty (change of) liver, not elsewhere classified: Secondary | ICD-10-CM | POA: Insufficient documentation

## 2014-07-22 ENCOUNTER — Other Ambulatory Visit: Payer: Self-pay | Admitting: Internal Medicine

## 2014-07-22 MED ORDER — LEDIPASVIR-SOFOSBUVIR 90-400 MG PO TABS: 1.0000 | ORAL_TABLET | Freq: Every day | ORAL | Status: DC

## 2014-08-06 ENCOUNTER — Encounter (HOSPITAL_COMMUNITY): Payer: Self-pay | Admitting: Pharmacy Technician

## 2014-08-10 ENCOUNTER — Other Ambulatory Visit: Payer: Self-pay | Admitting: Internal Medicine

## 2014-08-10 ENCOUNTER — Other Ambulatory Visit: Payer: Medicare Other

## 2014-08-10 DIAGNOSIS — B1920 Unspecified viral hepatitis C without hepatic coma: Secondary | ICD-10-CM

## 2014-08-10 LAB — COMPREHENSIVE METABOLIC PANEL
ALK PHOS: 85 U/L (ref 33–130)
ALT: 23 U/L (ref 6–29)
AST: 28 U/L (ref 10–35)
Albumin: 3.6 g/dL (ref 3.6–5.1)
BILIRUBIN TOTAL: 0.4 mg/dL (ref 0.2–1.2)
BUN: 9 mg/dL (ref 7–25)
CHLORIDE: 103 mmol/L (ref 98–110)
CO2: 24 mmol/L (ref 20–31)
Calcium: 9 mg/dL (ref 8.6–10.4)
Creat: 0.63 mg/dL (ref 0.50–0.99)
Glucose, Bld: 111 mg/dL — ABNORMAL HIGH (ref 65–99)
POTASSIUM: 4.5 mmol/L (ref 3.5–5.3)
SODIUM: 136 mmol/L (ref 135–146)
Total Protein: 7.1 g/dL (ref 6.1–8.1)

## 2014-08-10 LAB — CBC WITH DIFFERENTIAL/PLATELET
BASOS ABS: 0 10*3/uL (ref 0.0–0.1)
BASOS PCT: 0 % (ref 0–1)
EOS PCT: 5 % (ref 0–5)
Eosinophils Absolute: 0.1 10*3/uL (ref 0.0–0.7)
HCT: 36.5 % (ref 36.0–46.0)
Hemoglobin: 11.6 g/dL — ABNORMAL LOW (ref 12.0–15.0)
Lymphocytes Relative: 43 % (ref 12–46)
Lymphs Abs: 1.1 10*3/uL (ref 0.7–4.0)
MCH: 25.3 pg — AB (ref 26.0–34.0)
MCHC: 31.8 g/dL (ref 30.0–36.0)
MCV: 79.5 fL (ref 78.0–100.0)
MONOS PCT: 9 % (ref 3–12)
MPV: 8.9 fL (ref 8.6–12.4)
Monocytes Absolute: 0.2 10*3/uL (ref 0.1–1.0)
Neutro Abs: 1.1 10*3/uL — ABNORMAL LOW (ref 1.7–7.7)
Neutrophils Relative %: 43 % (ref 43–77)
Platelets: 187 10*3/uL (ref 150–400)
RBC: 4.59 MIL/uL (ref 3.87–5.11)
RDW: 17.1 % — ABNORMAL HIGH (ref 11.5–15.5)
WBC: 2.5 10*3/uL — AB (ref 4.0–10.5)

## 2014-08-12 LAB — HEPATITIS C RNA QUANTITATIVE
HCV Quantitative Log: 2.65 {Log} — ABNORMAL HIGH (ref <1.18)
HCV Quantitative: 443 IU/mL — ABNORMAL HIGH (ref <15)

## 2014-08-13 ENCOUNTER — Ambulatory Visit: Payer: Commercial Managed Care - HMO | Admitting: Internal Medicine

## 2014-08-29 ENCOUNTER — Other Ambulatory Visit: Payer: Self-pay | Admitting: Hematology and Oncology

## 2014-08-31 NOTE — Telephone Encounter (Signed)
Last ov 06/01/14.  Next ov 09/04/14.  Chart reviewed

## 2014-09-01 ENCOUNTER — Other Ambulatory Visit (HOSPITAL_BASED_OUTPATIENT_CLINIC_OR_DEPARTMENT_OTHER): Payer: Medicare Other

## 2014-09-01 ENCOUNTER — Telehealth: Payer: Self-pay | Admitting: *Deleted

## 2014-09-01 ENCOUNTER — Ambulatory Visit (HOSPITAL_COMMUNITY)
Admission: RE | Admit: 2014-09-01 | Discharge: 2014-09-01 | Disposition: A | Discharge status: Home / Self Care | Payer: Medicare Other | Source: Ambulatory Visit | Attending: Hematology and Oncology | Admitting: Hematology and Oncology

## 2014-09-01 ENCOUNTER — Telehealth: Payer: Self-pay

## 2014-09-01 ENCOUNTER — Encounter (HOSPITAL_COMMUNITY): Payer: Self-pay

## 2014-09-01 DIAGNOSIS — Z08 Encounter for follow-up examination after completed treatment for malignant neoplasm: Secondary | ICD-10-CM | POA: Insufficient documentation

## 2014-09-01 DIAGNOSIS — C8510 Unspecified B-cell lymphoma, unspecified site: Secondary | ICD-10-CM | POA: Diagnosis present

## 2014-09-01 DIAGNOSIS — C8334 Diffuse large B-cell lymphoma, lymph nodes of axilla and upper limb: Secondary | ICD-10-CM

## 2014-09-01 DIAGNOSIS — R590 Localized enlarged lymph nodes: Secondary | ICD-10-CM | POA: Insufficient documentation

## 2014-09-01 DIAGNOSIS — J984 Other disorders of lung: Secondary | ICD-10-CM | POA: Insufficient documentation

## 2014-09-01 LAB — CBC WITH DIFFERENTIAL/PLATELET
BASO%: 1.2 % (ref 0.0–2.0)
Basophils Absolute: 0.1 10*3/uL (ref 0.0–0.1)
EOS%: 4.1 % (ref 0.0–7.0)
Eosinophils Absolute: 0.2 10*3/uL (ref 0.0–0.5)
HCT: 36.8 % (ref 34.8–46.6)
HEMOGLOBIN: 11.8 g/dL (ref 11.6–15.9)
LYMPH#: 1.2 10*3/uL (ref 0.9–3.3)
LYMPH%: 26.8 % (ref 14.0–49.7)
MCH: 24.5 pg — ABNORMAL LOW (ref 25.1–34.0)
MCHC: 31.9 g/dL (ref 31.5–36.0)
MCV: 76.8 fL — ABNORMAL LOW (ref 79.5–101.0)
MONO#: 0.5 10*3/uL (ref 0.1–0.9)
MONO%: 11.8 % (ref 0.0–14.0)
NEUT%: 56.1 % (ref 38.4–76.8)
NEUTROS ABS: 2.4 10*3/uL (ref 1.5–6.5)
Platelets: 177 10*3/uL (ref 145–400)
RBC: 4.8 10*6/uL (ref 3.70–5.45)
RDW: 16.8 % — ABNORMAL HIGH (ref 11.2–14.5)
WBC: 4.4 10*3/uL (ref 3.9–10.3)

## 2014-09-01 LAB — COMPREHENSIVE METABOLIC PANEL (CC13)
ALBUMIN: 3.2 g/dL — AB (ref 3.5–5.0)
ALK PHOS: 84 U/L (ref 40–150)
ALT: 14 U/L (ref 0–55)
AST: 17 U/L (ref 5–34)
Anion Gap: 9 mEq/L (ref 3–11)
BUN: 9.5 mg/dL (ref 7.0–26.0)
CALCIUM: 9.5 mg/dL (ref 8.4–10.4)
CO2: 24 mEq/L (ref 22–29)
CREATININE: 0.7 mg/dL (ref 0.6–1.1)
Chloride: 108 mEq/L (ref 98–109)
EGFR: >90 mL/min/{1.73_m2} (ref >90)
GLUCOSE: 83 mg/dL (ref 70–140)
Potassium: 4.3 mEq/L (ref 3.5–5.1)
SODIUM: 141 meq/L (ref 136–145)
Total Bilirubin: 0.26 mg/dL (ref 0.20–1.20)
Total Protein: 7.4 g/dL (ref 6.4–8.3)

## 2014-09-01 LAB — LACTATE DEHYDROGENASE (CC13): LDH: 165 U/L (ref 125–245)

## 2014-09-01 MED ORDER — IOHEXOL 300 MG/ML  SOLN
100.0000 mL | Freq: Once | INTRAMUSCULAR | Status: AC | PRN
  Administered 2014-09-01: 100 mL via INTRAVENOUS

## 2014-09-01 NOTE — Telephone Encounter (Signed)
Per Dr Lindi Adie request, called Rachael West to check on her.  Rachael West reports she feels fine, no SOB, no chest cain.  Advised Rachael West to call clinic if she has any problems.  Rachael West voiced understanding.

## 2014-09-01 NOTE — Telephone Encounter (Signed)
THE REPORT WAS CALLED AND FAXED THEN GIVEN TO [place]

## 2014-09-03 NOTE — Assessment & Plan Note (Signed)
Aggressive large B-cell lymphoma: CD5 and 10 negative, CD20 positive BCL 2 and BCL 6 positive: Differential diagnosis aggressive large B-cell lymphoma versus B-cell lymphoma unclassified, Ki-67 100%, bulky lymphadenopathy supraclavicular and axillary lymph nodes, bone marrow negative, stage IIB  Treatment Summary: R CHOP chemotherapy started 01/16/2014 completed 05/01/14 PET-CT 05/29/14: Near CR CT chest abdomen pelvis 09/01/2014: Dense right middle lobe consolidation suspicious for pneumonia, lymphoma is infiltration cannot be ruled out, no significant change in size and appearance of left supraclavicular and left axillary lymphadenopathy.  Radiology review: I discussed the radiology findings with the patient and reviewed the CT scan in detail. I recommended that the patient undergo bronchoscopy for further evaluation of the right middle lobe consolidation. I will consult pulmonology for bronchoscopy and biopsy.  Return to clinic after the biopsy to discuss the results.

## 2014-09-04 ENCOUNTER — Telehealth: Payer: Self-pay | Admitting: Hematology and Oncology

## 2014-09-04 ENCOUNTER — Ambulatory Visit (HOSPITAL_BASED_OUTPATIENT_CLINIC_OR_DEPARTMENT_OTHER): Payer: Medicare Other | Admitting: Hematology and Oncology

## 2014-09-04 ENCOUNTER — Encounter: Payer: Self-pay | Admitting: Hematology and Oncology

## 2014-09-04 VITALS — BP 116/68 | HR 106 | Temp 98.4°F | Resp 18 | Ht 65.5 in | Wt 197.1 lb

## 2014-09-04 DIAGNOSIS — C8510 Unspecified B-cell lymphoma, unspecified site: Secondary | ICD-10-CM

## 2014-09-04 DIAGNOSIS — B182 Chronic viral hepatitis C: Secondary | ICD-10-CM

## 2014-09-04 DIAGNOSIS — C8334 Diffuse large B-cell lymphoma, lymph nodes of axilla and upper limb: Secondary | ICD-10-CM

## 2014-09-04 NOTE — Progress Notes (Signed)
Patient Care Team: [name], MD as PCP - General (Family Medicine)  DIAGNOSIS: No matching staging information was found for the patient.  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 PET activity in the bone marrow and spleen thought to be related to chemotherapy rather than disease. (Stage 2B)   05/29/2014 PET scan Significant improvement, with reduction in size and activity of the left supraclavicular and left axillary lymph node ; reduced activity in the normal size spleen; and resolution of the diffuse hypermetabolic marrow activity.    CHIEF COMPLIANT: Complains of wheezing  INTERVAL HISTORY: Rachael West is a 59 year old with above-mentioned history of aggressive large B-cell lymphoma who received R CHOP chemotherapy and is now in remission. She had a CT scan recently and is here today to discuss the results. She complains of wheezing that has been going on along with cough with whitish expectoration. She denies any fevers or chills. Denies any chest pain or nausea or vomiting. She feels well otherwise.  REVIEW OF SYSTEMS:   Constitutional: Denies fevers, chills or abnormal weight loss Eyes: Denies blurriness of vision Ears, nose, mouth, throat, and face: Denies mucositis or sore throat Respiratory: Cough expectoration and wheezing Cardiovascular: Denies palpitation, chest discomfort or lower extremity swelling Gastrointestinal:  Denies nausea, heartburn or change in bowel habits Skin: Denies abnormal skin  rashes Lymphatics: Denies new lymphadenopathy or easy bruising Neurological:Denies numbness, tingling or new weaknesses Behavioral/Psych: Mood is stable, no new changes  All other systems were reviewed with the patient and are negative.  I have reviewed the past medical history, past surgical history, social history and family history with the patient and they are unchanged from previous note.  ALLERGIES:  is allergic to latex; penicillins; codeine; and sulfa antibiotics.  MEDICATIONS:  Current Outpatient Prescriptions  Medication Sig Dispense Refill  . albuterol (PROVENTIL) (2.5 MG/3ML) 0.083% nebulizer solution Take 2.5 mg by nebulization every 6 (six) hours as needed for wheezing.     Marland Kitchen allopurinol (ZYLOPRIM) 300 MG tablet TAKE 1 TABLET (300 MG TOTAL) BY MOUTH DAILY. 30 tablet 1  . allopurinol (ZYLOPRIM) 300 MG tablet TAKE 1 TABLET (300 MG TOTAL) BY MOUTH DAILY. 30 tablet 1  . ALPRAZolam (XANAX) 0.25 MG tablet   1  . amitriptyline (ELAVIL) 25 MG tablet Take 25 mg by mouth at bedtime.    Marland Kitchen amLODipine (NORVASC) 5 MG tablet Take 5 mg by mouth every morning.     . bimatoprost (LUMIGAN) 0.01 % SOLN Place 1 drop into both eyes at bedtime.    . fluticasone-salmeterol (ADVAIR HFA) 115-21 MCG/ACT inhaler Inhale 2 puffs into the lungs 2 (two) times daily.      . hydrOXYzine (VISTARIL) 50 MG capsule Take 50 mg by mouth at bedtime.    . Ledipasvir-Sofosbuvir (HARVONI) 90-400 MG TABS Take 1 tablet by mouth daily. 28 tablet 2  . lidocaine-prilocaine (EMLA) cream Apply to affected area once 30 g 3  . lisinopril (PRINIVIL,ZESTRIL) 5 MG tablet Take 5 mg by mouth every morning.     Marland Kitchen LORazepam (ATIVAN) 0.5 MG tablet  Take 1 tablet (0.5 mg total) by mouth every 6 (six) hours as needed (Nausea or vomiting). 30 tablet 0  . meloxicam (MOBIC) 15 MG tablet Take 15 mg by mouth daily.      Marland Kitchen omeprazole (PRILOSEC) 40 MG capsule   5  . ondansetron (ZOFRAN) 8 MG tablet Take 1 tablet (8 mg total) by mouth 2 (two)  times daily. Start the day after chemo for 3 days. Then as needed for nausea or vomiting. 30 tablet 1  . paliperidone (INVEGA) 6 MG 24 hr tablet Take 6 mg by mouth every morning.    . pantoprazole (PROTONIX) 40 MG tablet Take 40 mg by mouth daily.      . polyethylene glycol powder (GLYCOLAX/MIRALAX) powder   5  . prazosin (MINIPRESS) 1 MG capsule Take 1 mg by mouth at bedtime.    . predniSONE (DELTASONE) 20 MG tablet Take 3 tablets (60 mg total) by mouth daily. Take on days 1-5 of chemotherapy. 15 tablet 5  . prochlorperazine (COMPAZINE) 10 MG tablet Take 1 tablet (10 mg total) by mouth every 6 (six) hours as needed (Nausea or vomiting). 30 tablet 6  . sertraline (ZOLOFT) 100 MG tablet Take 200 mg by mouth every morning.    . traMADol (ULTRAM) 50 MG tablet Take 50 mg by mouth 3 (three) times daily.    . traZODone (DESYREL) 100 MG tablet Take 100 mg by mouth at bedtime.      . triamcinolone (NASACORT) 55 MCG/ACT nasal inhaler Place 2 sprays into the nose daily.       No current facility-administered medications for this visit.    PHYSICAL EXAMINATION: ECOG PERFORMANCE STATUS: 1 - Symptomatic but completely ambulatory  Filed Vitals:   09/04/14 1112  BP: 116/68  Pulse: 106  Temp: 98.4 F (36.9 C)  Resp: 18   Filed Weights   09/04/14 1112  Weight: 197 lb 1.6 oz (89.404 kg)    GENERAL:alert, no distress and comfortable SKIN: skin color, texture, turgor are normal, no rashes or significant lesions EYES: normal, Conjunctiva are pink and non-injected, sclera clear OROPHARYNX:no exudate, no erythema and lips, buccal mucosa, and tongue normal  NECK: supple, thyroid normal size, non-tender, without nodularity LYMPH:  Right posteriorly crackles and wheezes LUNGS: clear to auscultation and percussion with normal breathing effort HEART: regular rate & rhythm and no murmurs and no lower extremity edema ABDOMEN:abdomen soft, non-tender and normal bowel sounds Musculoskeletal:no cyanosis of  digits and no clubbing  NEURO: alert & oriented x 3 with fluent speech, no focal motor/sensory deficits  LABORATORY DATA:  I have reviewed the data as listed   Chemistry      Component Value Date/Time   NA 141 09/01/2014 1104   NA 136 08/10/2014 1222   K 4.3 09/01/2014 1104   K 4.5 08/10/2014 1222   CL 103 08/10/2014 1222   CO2 24 09/01/2014 1104   CO2 24 08/10/2014 1222   BUN 9.5 09/01/2014 1104   BUN 9 08/10/2014 1222   CREATININE 0.7 09/01/2014 1104   CREATININE 0.63 08/10/2014 1222   CREATININE 0.80 10/31/2010 2345      Component Value Date/Time   CALCIUM 9.5 09/01/2014 1104   CALCIUM 9.0 08/10/2014 1222   ALKPHOS 84 09/01/2014 1104   ALKPHOS 85 08/10/2014 1222   AST 17 09/01/2014 1104   AST 28 08/10/2014 1222   ALT 14 09/01/2014 1104   ALT 23 08/10/2014 1222   BILITOT 0.26 09/01/2014 1104   BILITOT 0.4 08/10/2014 1222  Lab Results  Component Value Date   WBC 4.4 09/01/2014   HGB 11.8 09/01/2014   HCT 36.8 09/01/2014   MCV 76.8* 09/01/2014   PLT 177 09/01/2014   NEUTROABS 2.4 09/01/2014    ASSESSMENT & PLAN:  Large B-cell lymphoma Aggressive large B-cell lymphoma: CD5 and 10 negative, CD20 positive BCL 2 and BCL 6 positive: Differential diagnosis aggressive large B-cell lymphoma versus B-cell lymphoma unclassified, Ki-67 100%, bulky lymphadenopathy supraclavicular and axillary lymph nodes, bone marrow negative, stage IIB  Treatment Summary: R CHOP chemotherapy started 01/16/2014 completed 05/01/14 PET-CT 05/29/14: Near CR CT chest abdomen pelvis 09/01/2014: Dense right middle lobe consolidation suspicious for pneumonia, lymphoma is infiltration cannot be ruled out, no significant change in size and appearance of left supraclavicular and left axillary lymphadenopathy.  Radiology review: I discussed the radiology findings with the patient and reviewed the CT scan in detail. I recommended that the patient undergo bronchoscopy for further evaluation of the  right middle lobe consolidation. I will consult pulmonology for bronchoscopy and biopsy.  Return to clinic after the biopsy to discuss the results in 2 months with labs.   Orders Placed This Encounter  Procedures  . CBC with Differential    Standing Status: Future     Number of Occurrences:      Standing Expiration Date: 09/04/2015  . Comprehensive metabolic panel (Cmet) - [facility]    Standing Status: Future     Number of Occurrences:      Standing Expiration Date: 09/04/2015  . Lactate dehydrogenase (LDH) - [facility]    Standing Status: Future     Number of Occurrences:      Standing Expiration Date: 09/04/2015  . Ambulatory referral to Pulmonology    Referral Priority:  Routine    Referral Type:  Consultation    Referral Reason:  Specialty Services Required    Requested Specialty:  Pulmonary Disease    Number of Visits Requested:  1   The patient has a good understanding of the overall plan. she agrees with it. she will call with any problems that may develop before the next visit here.   Rulon Eisenmenger, MD

## 2014-09-04 NOTE — Addendum Note (Signed)
Addended by: Prentiss Bells on: 09/04/2014 12:26 PM   Modules accepted: Medications

## 2014-09-04 NOTE — Telephone Encounter (Signed)
Appointments made and avs printed for patient,referral noted and added Wallace so they will call her  anne

## 2014-09-08 ENCOUNTER — Ambulatory Visit (INDEPENDENT_AMBULATORY_CARE_PROVIDER_SITE_OTHER): Payer: Medicare Other | Admitting: Internal Medicine

## 2014-09-08 ENCOUNTER — Encounter: Payer: Self-pay | Admitting: Internal Medicine

## 2014-09-08 VITALS — BP 113/79 | HR 102 | Temp 98.2°F | Ht 66.0 in | Wt 193.0 lb

## 2014-09-08 DIAGNOSIS — K7460 Unspecified cirrhosis of liver: Secondary | ICD-10-CM | POA: Insufficient documentation

## 2014-09-08 DIAGNOSIS — B182 Chronic viral hepatitis C: Secondary | ICD-10-CM | POA: Diagnosis present

## 2014-09-08 DIAGNOSIS — K7400 Hepatic fibrosis, unspecified: Secondary | ICD-10-CM

## 2014-09-08 NOTE — Progress Notes (Signed)
   Subjective:    Patient ID: Rachael West, female    DOB: May 14, 1955, 59 y.o.   MRN: [id number]  HPI Here for follow up of HCV.  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  Started Harvoni and denies any missed doses.  Takes with ppi.  Viral load now down to 443.  No headache, no fatigue. Hepatitis A immune and B non-immune.     Review of Systems  Constitutional: Negative for fatigue.  Gastrointestinal: Negative for nausea and diarrhea.  Skin: Negative for rash.  Neurological: Negative for dizziness and light-headedness.       Objective:   Physical Exam  Constitutional: She appears well-developed and well-nourished.  HENT:  Mouth/Throat: No oropharyngeal exudate.  Eyes: No scleral icterus.  Cardiovascular: Normal rate, regular rhythm and normal heart sounds.   No murmur heard. Pulmonary/Chest: Effort normal and breath sounds normal. No respiratory distress.  Lymphadenopathy:    She has no cervical adenopathy.  Skin: No rash noted.          Assessment & Plan:

## 2014-09-08 NOTE — Assessment & Plan Note (Signed)
Doing great.  Will check again after treatment.

## 2014-09-08 NOTE — Assessment & Plan Note (Signed)
Will discuss with her next visit that she will need to see GI.  Will do hep B series and pneumovax then too.

## 2014-09-11 ENCOUNTER — Encounter: Payer: Self-pay | Admitting: Internal Medicine

## 2014-09-11 ENCOUNTER — Ambulatory Visit (INDEPENDENT_AMBULATORY_CARE_PROVIDER_SITE_OTHER): Payer: Medicare Other | Admitting: Internal Medicine

## 2014-09-11 VITALS — BP 104/70 | HR 98 | Ht 66.0 in | Wt 196.2 lb

## 2014-09-11 DIAGNOSIS — I10 Essential (primary) hypertension: Secondary | ICD-10-CM | POA: Diagnosis not present

## 2014-09-11 DIAGNOSIS — E669 Obesity, unspecified: Secondary | ICD-10-CM | POA: Diagnosis not present

## 2014-09-11 DIAGNOSIS — J4520 Mild intermittent asthma, uncomplicated: Secondary | ICD-10-CM | POA: Diagnosis not present

## 2014-09-11 DIAGNOSIS — J181 Lobar pneumonia, unspecified organism: Secondary | ICD-10-CM | POA: Diagnosis not present

## 2014-09-11 MED ORDER — MOMETASONE FURO-FORMOTEROL FUM 100-5 MCG/ACT IN AERO: INHALATION_SPRAY | RESPIRATORY_TRACT | Status: DC

## 2014-09-11 MED ORDER — LEVOFLOXACIN 500 MG PO TABS: 500.0000 mg | ORAL_TABLET | Freq: Every day | ORAL | Status: DC

## 2014-09-11 NOTE — Patient Instructions (Addendum)
Stop lisinopril and advair  For breathing problems try dulera 100 Take 2 puffs first thing in am and then another 2 puffs about 12 hours later.    Only use your albuterol (proair) as a rescue medication to be used if you can't catch your breath by resting or doing a relaxed purse lip breathing pattern.  - The less you use it, the better it will work when you need it. - Ok to use up to 2 puffs  every 4 hours if you must but call for immediate appointment if use goes up over your usual need - Don't leave home without it !!  (think of it like the spare tire for your car)    Levaquin 500 mg one daily x 10 days then return for cxr - call sooner if condition worsens [place]

## 2014-09-11 NOTE — Progress Notes (Signed)
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 Pulmonary Consult    Referred by Dr. Nicholas West. Pt states she was dxed with PNA x 1 wk ago. She c/o SOB, cough with bloody sputum and chest tightness.   overall worse since mid July 2016 with sob/cough never rx'd with abx, some chills some green mucus no documented fever no advair or saba day of ov and feels tight in chest s saba but pfts s airflow obst off rx x > 12 h  No obvious day to day or daytime variability or assoc   cp or subjective wheeze or overt sinus  symptoms. No unusual exp hx or h/o childhood pna/ asthma or knowledge of premature birth.  Sleeping ok without nocturnal  or early am exacerbation  of respiratory  c/o's or need for noct saba. Also denies any obvious fluctuation of symptoms with weather or environmental changes or other aggravating or alleviating factors except as outlined above   Current Medications, Allergies, Complete Past Medical History, Past Surgical History, Family History, and Social History were reviewed in [employer] record.          Review of Systems  Constitutional: Negative for fever, chills and unexpected weight change.  HENT: Positive for congestion, dental problem and sneezing. Negative for ear pain, nosebleeds, postnasal drip, rhinorrhea, sinus pressure, sore throat, trouble swallowing and voice change.   Eyes: Negative for  visual disturbance.  Respiratory: Positive for cough and shortness of breath. Negative for choking.   Cardiovascular: Positive for leg swelling. Negative for chest pain.  Gastrointestinal: Negative for vomiting, abdominal pain and diarrhea.  Genitourinary: Negative for difficulty urinating.       Acid heartburn Indigestion   Musculoskeletal: Positive for arthralgias.  Skin: Negative for rash.  Neurological: Negative for tremors, syncope and headaches.  Hematological: Does not bruise/bleed easily.       Objective:   Physical Exam  amb wf nad quite hoarse/ raspy upper airway cough   Wt Readings from Last 3 Encounters:  09/11/14 196 lb 3.2 oz (88.996 kg)  09/08/14 193 lb (87.544 kg)  09/04/14 197 lb 1.6 oz (89.404 kg)    Vital signs reviewed   HEENT: very poor  dentition,  Nl turbinates, and orophanx. Nl external ear canals without cough reflex   NECK :  without JVD/Nodes/TM/ nl carotid upstrokes bilaterally   LUNGS: no acc muscle use, clear to A and P bilaterally without cough on insp or exp maneuvers - could not appreciate any bronchial bs over RML   CV:  RRR  no s3 or murmur or increase in P2, no edema   ABD:  soft and nontender with nl excursion in the supine position. No bruits or organomegaly, bowel sounds nl  MS:  warm without deformities, calf tenderness, cyanosis or clubbing  SKIN: warm and dry without lesions  NEURO:  alert, approp, no deficits     CT chest  09/01/14  Dense right middle lobe consolidation with air bronchogram formation, configuration most typical for pneumonia. Lymphomatous infiltration may appear similar, however and clinical correlation is recommended. Other alveolar filling processes may appear similar.  These results will be called to the ordering clinician or representative by the Radiologist Assistant, and communication documented in the PACS or zVision Dashboard.  No significant change in size or appearance of left  supraclavicular and left axillary lymphadenopathy to suggest progression of disease.  No new evidence for lymphomatous involvement in the abdomen or pelvis.       Assessment & Plan:

## 2014-09-12 DIAGNOSIS — I10 Essential (primary) hypertension: Secondary | ICD-10-CM | POA: Insufficient documentation

## 2014-09-12 DIAGNOSIS — E669 Obesity, unspecified: Secondary | ICD-10-CM | POA: Insufficient documentation

## 2014-09-12 NOTE — Assessment & Plan Note (Signed)
Body mass index is 31.68 Lab Results  Component Value Date   TSH 2.224 09/25/2008     Contributing to gerd tendency/ doe/reviewed need  achieve and maintain neg calorie balance > defer f/u primary care including intermittently monitoring thyroid status

## 2014-09-12 NOTE — Assessment & Plan Note (Signed)
ACE inhibitors are problematic in  pts with airway complaints because  even experienced pulmonologists can't always distinguish ace effects from copd/asthma/pnds/ allergies etc.  By themselves they don't actually cause a problem, much like oxygen can't by itself start a fire, but they certainly serve as a powerful catalyst or enhancer for any "fire"  or inflammatory process in the upper airway, be it caused by an ET  tube or more commonly reflux (especially in the obese or pts with known GERD or who are on biphoshonates) or URI's, due to interference with bradykinin clearance.  The effects of acei on bradykinin levels occurs in 100% of pt's on acei (unless they surreptitiously stop the med!) but the classic cough is only reported in 5%.  This leaves 95% of pts on acei's  with a variety of syndromes including no identifiable symptom in most  vs non-specific symptoms that wax and wane depending on what other insult is occuring at the level of the upper airway.   Since on low dose ACEi and bp on low side will just ask her to stop it completely and keep track of bp and double the norvasc if needed

## 2014-09-12 NOTE — Assessment & Plan Note (Addendum)
-   spirometry 09/11/2014 > wnl including fef 25-75     Symptoms are markedly disproportionate to objective findings and not clear this is a lung problem but pt does appear to have difficult airway management issues. DDX of  difficult airways management all start with A and  include Adherence, Ace Inhibitors, Acid Reflux, Active Sinus Disease, Alpha 1 Antitripsin deficiency, Anxiety masquerading as Airways dz,  ABPA,  allergy(esp in young), Aspiration (esp in elderly), Adverse effects of meds,  Active smokers, A bunch of PE's (a small clot burden can't cause this syndrome unless there is already severe underlying pulm or vascular dz with poor reserve) plus two Bs  = Bronchiectasis and Beta blocker use..and one C= CHF  Adherence is always the initial "prime suspect" and is a multilayered concern that requires a "trust but verify" approach in every patient - starting with knowing how to use medications, especially inhalers, correctly, keeping up with refills and understanding the fundamental difference between maintenance and prns vs those medications only taken for a very short course and then stopped and not refilled.  - The proper method of use, as well as anticipated side effects, of a metered-dose inhaler are discussed and demonstrated to the patient. Improved effectiveness after extensive coaching during this visit to a level of approximately  75%   ? acei related cough/sob prior to her recent deterioration may be when she didn't present with a perception of a def change in chronic symptoms for what may be just a CAP > only way to know is trial off   ? Adverse effect of advair > try off and use dulera 2 bid for now until we have a chance to regroup  I had an extended discussion with the patient reviewing all relevant studies completed to date and  lasting  63 m  Each maintenance medication was reviewed in detail including most importantly the difference between maintenance and prns and under what  circumstances the prns are to be triggered using an action plan format that is not reflected in the computer generated alphabetically organized AVS.    Please see instructions for details which were reviewed in writing and the patient given a copy highlighting the part that I personally wrote and discussed at today's ov.

## 2014-09-12 NOTE — Assessment & Plan Note (Signed)
Although the hx does not fit with CAP, note that RML is a difficult lobe sometimes to sort out as to what's going on clinically - what we can say for use is that the air bronchograms rule out airway obstruction and there are no other signs of lymphoma on CT to think this is related.   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

## 2014-09-12 NOTE — Assessment & Plan Note (Signed)
>>  ASSESSMENT AND PLAN FOR OBESITY WRITTEN ON 09/12/2014  2:09 PM BY WERT, MICHAEL B, MD  Body mass index is 31.68 Lab Results  Component Value Date   TSH 2.224 09/25/2008     Contributing to gerd tendency/ doe/reviewed need  achieve and maintain neg calorie balance > defer f/u primary care including intermittently monitoring thyroid status

## 2014-09-21 ENCOUNTER — Ambulatory Visit (INDEPENDENT_AMBULATORY_CARE_PROVIDER_SITE_OTHER): Payer: Medicare Other | Admitting: Internal Medicine

## 2014-09-21 ENCOUNTER — Ambulatory Visit (INDEPENDENT_AMBULATORY_CARE_PROVIDER_SITE_OTHER)
Admission: RE | Admit: 2014-09-21 | Discharge: 2014-09-21 | Disposition: A | Discharge status: Home / Self Care | Payer: Medicare Other | Source: Ambulatory Visit | Attending: Internal Medicine | Admitting: Internal Medicine

## 2014-09-21 ENCOUNTER — Encounter: Payer: Self-pay | Admitting: Internal Medicine

## 2014-09-21 VITALS — BP 118/84 | HR 101 | Ht 66.0 in | Wt 196.0 lb

## 2014-09-21 DIAGNOSIS — J181 Lobar pneumonia, unspecified organism: Secondary | ICD-10-CM

## 2014-09-21 DIAGNOSIS — I10 Essential (primary) hypertension: Secondary | ICD-10-CM

## 2014-09-21 DIAGNOSIS — J4520 Mild intermittent asthma, uncomplicated: Secondary | ICD-10-CM | POA: Diagnosis not present

## 2014-09-21 NOTE — Progress Notes (Signed)
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 Pulmonary Consult    Referred by Dr. Nicholas Lose. Pt states she was dxed with PNA x 1 wk ago. She c/o SOB, cough with bloody sputum and chest tightness.   overall worse since mid July 2016 with sob/cough never rx'd with abx, some chills some green mucus no documented fever no advair or saba day of ov and feels tight in chest s saba but pfts s airflow obst off rx x > 12 h  rec Stop lisinopril and advair For breathing problems try dulera 100 Take 2 puffs first thing in am and then another 2 puffs about 12 hours later.  Only use your albuterol (proair) as a rescue medication Levaquin 500 mg one daily x 10 days then return for cxr - call sooner if condition worsens [facility] [place]    09/21/2014 f/u ov/[facility] Gildersleeve re: RML pna  Chief Complaint  Patient presents with  . Follow-up    Pt c/o prod cough with clear mucus, chest tightness, wheezing and SOB. Sinus congestion. Denies any fever, nause, or vomitting.    overall better/ no more chills or yellow mucus/ no need for saba  No obvious day to day or daytime variability or assoc cp   or overt  hb symptoms. No unusual exp hx or h/o childhood pna/ asthma or knowledge of premature birth.  Sleeping ok without nocturnal  or early am exacerbation  of respiratory  c/o's or need for noct saba. Also  denies any obvious fluctuation of symptoms with weather or environmental changes or other aggravating or alleviating factors except as outlined above   Current Medications, Allergies, Complete Past Medical History, Past Surgical History, Family History, and Social History were reviewed in [employer] record.  ROS  The following are not active complaints unless bolded sore throat, dysphagia, dental problems, itching, sneezing,  nasal congestion or excess/ purulent secretions, ear ache,   fever, chills, sweats, unintended wt loss, classically pleuritic or exertional cp, hemoptysis,  orthopnea pnd or leg swelling, presyncope, palpitations, abdominal pain, anorexia, nausea, vomiting, diarrhea  or change in bowel or bladder habits, change in stools or urine, dysuria,hematuria,  rash, arthralgias, visual complaints, headache, numbness, weakness or ataxia or problems with walking or coordination,  change in mood/affect or memory.           Objective:  Physical Exam  amb wf nad    09/21/2014        196  Wt Readings from Last 3 Encounters:  09/11/14 196 lb 3.2 oz (88.996 kg)  09/08/14 193 lb (87.544 kg)  09/04/14 197 lb 1.6 oz (89.404 kg)    Vital signs reviewed   HEENT: very poor  dentition,  Nl turbinates, and orophanx. Nl external ear canals without cough reflex  NECK :  without JVD/Nodes/TM/ nl carotid upstrokes bilaterally   LUNGS: no acc muscle use, clear to A and P bilaterally without cough on insp or exp maneuvers - could not appreciate any bronchial bs over RML   CV:  RRR  no s3 or murmur or increase in P2, no edema   ABD:  soft and nontender with nl excursion in the supine position. No bruits or organomegaly, bowel sounds nl  MS:  warm without deformities, calf tenderness, cyanosis or clubbing  SKIN: warm and dry without lesions    NEURO:  alert, approp, no deficits      I personally reviewed images and agree with radiology impression as follows:    CXR:  09/21/2014  1. Persistent RIGHT middle lobe consolidation respecting the fissures. In a patient with lymphoma, this is still most likely to represent an infectious process and pulmonary lymphoma is less likely. Atypical infections should be considered in a patient with altered immune response. This may represent an antibiotic treatment failure or slow response to antibiotic therapy. 2. Continued radiographic follow-up is recommended          Assessment & Plan:

## 2014-09-21 NOTE — Patient Instructions (Addendum)
Continue dulera 100 Take 2 puffs first thing in am and then another 2 puffs about 12 hours later.     Work on inhaler technique:  relax and gently blow all the way out then take a nice smooth deep breath back in, triggering the inhaler at same time you start breathing in.  Hold for up to 5 seconds if you can. Blow out thru nose. Rinse and gargle with water when done    Only use your albuterol (proair) as a rescue medication to be used if you can't catch your breath by resting or doing a relaxed purse lip breathing pattern.  - The less you use it, the better it will work when you need it. - Ok to use up to 2 puffs  every 4 hours if you must but call for immediate appointment if use goes up over your usual need - Don't leave home without it !!  (think of it like the spare tire for your car)   Please remember to go to the  x-ray department downstairs for your tests - we will call you with the results when they are available.

## 2014-09-22 ENCOUNTER — Encounter: Payer: Self-pay | Admitting: Internal Medicine

## 2014-09-22 ENCOUNTER — Telehealth: Payer: Self-pay | Admitting: Internal Medicine

## 2014-09-22 NOTE — Assessment & Plan Note (Addendum)
-  spirometry 09/11/2014 > wnl including fef 25-75 off all rx > 12 h with active symptoms of chest tightness  - Trial off acei 09/11/2014 >>>   The proper method of use, as well as anticipated side effects, of a metered-dose inhaler are discussed and demonstrated to the patient. Improved effectiveness after extensive coaching during this visit to a level of approximately  75% > continue dulera 100 2bid   All goals of chronic asthma control met including optimal function and elimination of symptoms with minimal need for rescue therapy.  Contingencies discussed in full including contacting this office immediately if not controlling the symptoms using the rule of two's.

## 2014-09-22 NOTE — Assessment & Plan Note (Addendum)
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

## 2014-09-22 NOTE — Telephone Encounter (Signed)
Called spoke with pt. She reports MW was going to call in an ABX for her to take at 09/21/14 OV. I don't see this in pt d/c instructions and nothing sent in. It does not state this on CXR report. Please advise MW thanks

## 2014-09-22 NOTE — Telephone Encounter (Signed)
Dicussed with pt, no need for further abx, if condition worsens next step is fob o/w see in 4 weeks

## 2014-09-22 NOTE — Assessment & Plan Note (Signed)
Trial off acei due to cough 09/11/2014 >  Adequate control on present rx, reviewed > no change in rx needed  - avoid acei here so as not to muddy the water in terms of interpretation of non-specific respiratory symptoms

## 2014-09-23 NOTE — Telephone Encounter (Signed)
Patient notified.  Nothing further needed. 

## 2014-10-05 ENCOUNTER — Ambulatory Visit (INDEPENDENT_AMBULATORY_CARE_PROVIDER_SITE_OTHER): Payer: Medicare Other | Admitting: Internal Medicine

## 2014-10-05 ENCOUNTER — Encounter: Payer: Self-pay | Admitting: Internal Medicine

## 2014-10-05 ENCOUNTER — Ambulatory Visit (INDEPENDENT_AMBULATORY_CARE_PROVIDER_SITE_OTHER)
Admission: RE | Admit: 2014-10-05 | Discharge: 2014-10-05 | Disposition: A | Discharge status: Home / Self Care | Payer: Medicare Other | Source: Ambulatory Visit | Attending: Internal Medicine | Admitting: Internal Medicine

## 2014-10-05 VITALS — BP 124/70 | HR 99 | Ht 66.0 in | Wt 201.2 lb

## 2014-10-05 DIAGNOSIS — I10 Essential (primary) hypertension: Secondary | ICD-10-CM | POA: Diagnosis not present

## 2014-10-05 DIAGNOSIS — E669 Obesity, unspecified: Secondary | ICD-10-CM

## 2014-10-05 DIAGNOSIS — J181 Lobar pneumonia, unspecified organism: Secondary | ICD-10-CM

## 2014-10-05 DIAGNOSIS — J4520 Mild intermittent asthma, uncomplicated: Secondary | ICD-10-CM

## 2014-10-05 MED ORDER — NEBIVOLOL HCL 5 MG PO TABS: 5.0000 mg | ORAL_TABLET | Freq: Every day | ORAL | Status: DC

## 2014-10-05 NOTE — Patient Instructions (Addendum)
Stop lisinopril Bystolic 5 mg daily instead of lisinopril   Continue dulera 100 Take 2 puffs first thing in am and then another 2 puffs about 12 hours later.   Work on inhaler technique:  relax and gently blow all the way out then take a nice smooth deep breath back in, triggering the inhaler at same time you start breathing in.  Hold for up to 5 seconds if you can. Blow out thru nose. Rinse and gargle with water when done     Only use your albuterol as a rescue medication to be used if you can't catch your breath by resting or doing a relaxed purse lip breathing pattern.  - The less you use it, the better it will work when you need it. - Ok to use up to 2 puffs  every 4 hours if you must but call for immediate appointment if use goes up over your usual need - Don't leave home without it !!  (think of it like the spare tire for your car)   Continue prilosec 40 mg Take 30-60 min before first meal of the day and over the counter pepcid ac 20 mg at bedtime  For cough or pain anywhere ok to take ultram 50mg  up to every 4 hours  Please remember to go to the  x-ray department downstairs for your tests - we will call you with the results when they are available.    Please schedule a follow up office visit in 2 weeks, sooner if needed with all medications in hand

## 2014-10-05 NOTE — Progress Notes (Signed)
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 Pulmonary Consult    Referred by Dr. Nicholas Lose. Pt states she was dxed with PNA x 1 wk ago. She c/o SOB, cough with bloody sputum and chest tightness.   overall worse since mid July 2016 with sob/cough never rx'd with abx, some chills some green mucus no documented fever no advair or saba day of ov and feels tight in chest s saba but pfts s airflow obst off rx x > 12 h  rec Stop lisinopril and advair For breathing problems try dulera 100 Take 2 puffs first thing in am and then another 2 puffs about 12 hours later.  Only use your albuterol (proair) as a rescue medication Levaquin 500 mg one daily x 10 days then return for cxr - call sooner if condition worsens [facility] [place]    09/21/2014 f/u ov/Wert re: RML pna  Chief Complaint  Patient presents with  . Follow-up    Pt c/o prod cough with clear mucus, chest tightness, wheezing and SOB. Sinus congestion. Denies any fever, nause, or vomitting.   overall better/ no more chills or yellow mucus/ no need for saba rec Continue dulera 100 Take 2 puffs first thing in am and then another 2 puffs about 12 hours later.  Work on inhaler technique:  relax and gently blow all the way out then take a nice smooth deep breath back in, triggering the inhaler at same time you start breathing in.   Hold for up to  Only use your albuterol (proair) as a rescue    10/05/2014  f/u ov/Wert re: breathing / coughing worse back on lisinopril  Chief Complaint  Patient presents with  . Follow-up    Pt reports breathing is getting worse, prod cough (clear), wheezing.    Not using ppi, back on acei,  Mucus is clear and not bloody at all.  Some nasal and ear congestion   No obvious day to day or daytime variability or   overt  hb symptoms. No unusual exp hx or h/o childhood pna/ asthma or knowledge of premature birth.  Sleeping ok without nocturnal  or early am exacerbation  of respiratory  c/o's or need for noct saba. Also denies any obvious fluctuation of symptoms with weather or environmental changes or other aggravating or alleviating factors except as outlined above   Current Medications, Allergies, Complete Past Medical History, Past Surgical History, Family History, and Social History were reviewed in [employer] record.  ROS  The following are not active complaints unless bolded sore throat, dysphagia, dental problems, itching, sneezing,  nasal congestion or excess/ purulent secretions, ear ache,   fever, chills, sweats, unintended wt loss, classically pleuritic or exertional cp, hemoptysis,  orthopnea pnd or leg swelling, presyncope, palpitations,  abdominal pain, anorexia, nausea, vomiting, diarrhea  or change in bowel or bladder habits, change in stools or urine, dysuria,hematuria,  rash, arthralgias, visual complaints, headache, numbness, weakness or ataxia or problems with walking or coordination,  change in mood/affect or memory.           Objective:  Physical Exam  amb bf nad very abn affect   09/21/2014        196  > 10/05/2014  201 Wt Readings from Last 3 Encounters:  09/11/14 196 lb 3.2 oz (88.996 kg)  09/08/14 193 lb (87.544 kg)  09/04/14 197 lb 1.6 oz (89.404 kg)    Vital signs reviewed   HEENT: very poor  dentition,  Nl turbinates, and orophanx.  Nl external ear canals without cough reflex   NECK :  without JVD/Nodes/TM/ nl carotid upstrokes bilaterally   LUNGS: no acc muscle use, clear to A and P bilaterally without cough on insp or exp maneuvers - could not appreciate any bronchial bs over RML   CV:  RRR  no s3 or murmur or increase in P2, no edema   ABD:  soft and nontender with nl excursion in the supine position. No bruits or organomegaly, bowel sounds nl  MS:  warm without deformities, calf tenderness, cyanosis or clubbing  SKIN: warm and dry without lesions    NEURO:  alert, approp, no deficits      I personally reviewed images and agree with radiology impression as follows:  CXR:  10/05/2014  Persistent right middle lobe opacity compatible with pneumonia.        Assessment & Plan:

## 2014-10-06 NOTE — Progress Notes (Signed)
Quick Note:  Spoke with pt and notified of results per Dr. Wert. Pt verbalized understanding and denied any questions.  ______ 

## 2014-10-11 ENCOUNTER — Encounter: Payer: Self-pay | Admitting: Internal Medicine

## 2014-10-11 NOTE — Assessment & Plan Note (Signed)
Trial off acei due to cough 09/11/2014 > restopped 10/05/2014   There is simply no way to sort out her problems accurately  if she stays on Ace inhibitors - Strongly prefer in this setting: Bystolic, the most beta -1  selective Beta blocker available in sample form, with bisoprolol the most selective generic choice  on the market.   Given samples of bystolic 5 mg daily

## 2014-10-11 NOTE — Assessment & Plan Note (Signed)
Body mass index is 32.49 - trending up   Lab Results  Component Value Date   TSH 2.224 09/25/2008     Contributing to gerd tendency/ doe/reviewed the need and the process to achieve and maintain neg calorie balance > defer f/u primary care including intermittently monitoring thyroid status

## 2014-10-11 NOTE — Assessment & Plan Note (Signed)
Onset of symptoms ? 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 If it persists then a bronchoscopy will be necessary along with lavage and a transbronchial biopsy with ddx including malignancy - opportunistic infection would be extremely unlikely given the clinical picture

## 2014-10-11 NOTE — Assessment & Plan Note (Signed)
>>  ASSESSMENT AND PLAN FOR OBESITY WRITTEN ON 10/11/2014  8:03 AM BY Sherene SiresWERT, MICHAEL B, MD  Body mass index is 32.49 - trending up   Lab Results  Component Value Date   TSH 2.224 09/25/2008     Contributing to gerd tendency/ doe/reviewed the need and the process to achieve and maintain neg calorie balance > defer f/u primary care including intermittently monitoring thyroid status

## 2014-10-11 NOTE — Assessment & Plan Note (Signed)
-   spirometry 09/11/2014 > wnl including fef 25-75 off all rx > 12 h with active symptoms of chest tightness  - Trial off acei 09/11/2014 > restopped 10/05/2014     The proper method of use, as well as anticipated side effects, of a metered-dose inhaler are discussed and demonstrated to the patient. Improved effectiveness after extensive coaching during this visit to a level of approximately  75% from a baseline of only 25%   Still also unclear as whether or not she has any true asthma. The absence of a change in the mid flows is strongly against asthma but to cover this possibility I think it's appropriate to continue to wear her samples of the dose 100 x  every 12 hours - avoiding higher dose inhaled steroids which can exacerbate upper airway symptoms that occur on Ace inhibitors and mimic asthma in the process.  I had an extended discussion with the patient reviewing all relevant studies completed to date and  lasting 15 to 20 minutes of a 25 minute visit    Each maintenance medication was reviewed in detail including most importantly the difference between maintenance and prns and under what circumstances the prns are to be triggered using an action plan format that is not reflected in the computer generated alphabetically organized AVS.    Please see instructions for details which were reviewed in writing and the patient given a copy highlighting the part that I personally wrote and discussed at today's ov.

## 2014-10-19 ENCOUNTER — Ambulatory Visit: Payer: Medicare Other | Admitting: Internal Medicine

## 2014-10-22 ENCOUNTER — Encounter: Payer: Self-pay | Admitting: Internal Medicine

## 2014-10-22 ENCOUNTER — Ambulatory Visit (INDEPENDENT_AMBULATORY_CARE_PROVIDER_SITE_OTHER)
Admission: RE | Admit: 2014-10-22 | Discharge: 2014-10-22 | Disposition: A | Discharge status: Home / Self Care | Payer: Medicare Other | Source: Ambulatory Visit | Attending: Internal Medicine | Admitting: Internal Medicine

## 2014-10-22 ENCOUNTER — Ambulatory Visit (INDEPENDENT_AMBULATORY_CARE_PROVIDER_SITE_OTHER): Payer: Medicare Other | Admitting: Internal Medicine

## 2014-10-22 VITALS — BP 134/90 | HR 87 | Ht 66.0 in | Wt 200.2 lb

## 2014-10-22 DIAGNOSIS — R0600 Dyspnea, unspecified: Secondary | ICD-10-CM | POA: Insufficient documentation

## 2014-10-22 DIAGNOSIS — I10 Essential (primary) hypertension: Secondary | ICD-10-CM

## 2014-10-22 DIAGNOSIS — J181 Lobar pneumonia, unspecified organism: Secondary | ICD-10-CM | POA: Diagnosis not present

## 2014-10-22 DIAGNOSIS — J4520 Mild intermittent asthma, uncomplicated: Secondary | ICD-10-CM

## 2014-10-22 DIAGNOSIS — E669 Obesity, unspecified: Secondary | ICD-10-CM | POA: Insufficient documentation

## 2014-10-22 NOTE — Patient Instructions (Addendum)
Continue bystolic 5 mg daily (samples x 4 weeks)   There is no evidence of asthma here so no need for advair or dulera at this point   Only use your albuterol as a rescue medication to be used if you can't catch your breath by resting or doing a relaxed purse lip breathing pattern.  - The less you use it, the better it will work when you need it. - Ok to use up to 2 puffs  every 4 hours if you must but call for immediate appointment if use goes up over your usual need - Don't leave home without it !!  (think of it like the spare tire for your car)   Please remember to go to the x-ray department downstairs for your tests - we will call you with the results when they are available.  Please schedule a follow up office visit in 4 weeks, sooner if needed

## 2014-10-22 NOTE — Assessment & Plan Note (Addendum)
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    Please see instructions for details which were reviewed in writing and the patient given a copy highlighting the part that I personally wrote and discussed at today's ov.

## 2014-10-22 NOTE — Progress Notes (Signed)
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 Pulmonary Consult    Referred by Dr. Nicholas Lose. Pt states she was dxed with PNA x 1 wk ago. She c/o SOB, cough with bloody sputum and chest tightness.   overall worse since mid July 2016 with sob/cough never rx'd with abx, some chills some green mucus no documented fever no advair or saba day of ov and feels tight in chest s saba but pfts s airflow obst off rx x > 12 h  rec Stop lisinopril and advair For breathing problems try dulera 100 Take 2 puffs first thing in am and then another 2 puffs about 12 hours later.  Only use your albuterol (proair) as a rescue medication Levaquin 500 mg one daily x 10 days then return for cxr - call sooner if condition worsens HOLD [place]    09/21/2014 f/u ov/Clinten Howk re: RML pna  Chief Complaint  Patient presents with  . Follow-up    Pt c/o prod cough with clear mucus, chest tightness, wheezing and SOB. Sinus congestion. Denies any fever, nause, or vomitting.   overall better/ no more chills or yellow mucus/ no need for saba rec Continue dulera 100 Take 2 puffs first thing in am and then another 2 puffs about 12 hours later.  Work on inhaler technique:  relax and gently blow all the way out then take a nice smooth deep breath back in, triggering the inhaler at same time you start breathing in.   Hold for up to  Only use your albuterol (proair) as a rescue    10/05/2014  f/u ov/Marcena Dias re: breathing / coughing worse back on lisinopril  Chief Complaint  Patient presents with  . Follow-up    Pt reports breathing is getting worse, prod cough (clear), wheezing.   Not using ppi, back on acei,  Mucus is clear and not bloody at all.  Some nasal and ear congestion  rec Stop lisinopril Bystolic 5 mg daily instead of lisinopril  Continue dulera 100 Take 2 puffs first thing in am and then another 2 puffs about 12 hours later.  Work on inhaler technique:   Continue prilosec 40 mg Take 30-60 min before first meal of the day and over the counter pepcid ac 20 mg at bedtime For cough or pain anywhere ok to take ultram 50mg  up to every 4 hours Please schedule a follow up office visit in 2 weeks, sooner if needed with all medications in hand    10/22/2014 extended  f/u ov/Loralai Eisman re: pna/ ? Psueudasthma?  - did not bring all meds  Chief Complaint  Patient presents with  . Follow-up    Breathing is unchanged. Cough has improved some and is non prod. She c/o congestion in her throat and nose today.  Out of all inhalers since one day prior to OV - never stopped advair as rec but did not take am of ov - did not bring all meds /   despite advair and dulera was still using saba every few hours until she ran out   No obvious day to day or daytime variability or  Cp/excess mucus production /  overt  Sinus or hb symptoms. No unusual exp hx or h/o childhood pna/ asthma or knowledge of premature birth.  Sleeping ok without nocturnal  or early am exacerbation  of respiratory  c/o's or need for noct saba. Also denies any obvious fluctuation of symptoms with weather or environmental changes or other aggravating or alleviating factors except as outlined above   Current Medications, Allergies, Complete Past Medical History, Past Surgical History, Family History, and Social History were reviewed in [facility] record.  ROS  The following are not active complaints unless bolded sore throat, dysphagia, dental problems, itching, sneezing,  nasal congestion or excess/ purulent secretions, ear ache,   fever, chills, sweats, unintended wt loss, classically pleuritic or exertional cp, hemoptysis,  orthopnea pnd or leg swelling, presyncope, palpitations, abdominal pain, anorexia, nausea, vomiting, diarrhea  or change in bowel or bladder habits, change in stools or urine, dysuria,hematuria,  rash, arthralgias, visual complaints, headache, numbness, weakness or ataxia or problems with walking or coordination,  change in mood/affect or memory.           Objective:  Physical Exam  amb bf nad very abn affect   09/21/2014        196  > 10/05/2014  201> 10/22/2014 200     09/11/14 196 lb 3.2 oz (88.996 kg)  09/08/14 193 lb (87.544 kg)  09/04/14 197 lb 1.6 oz (89.404 kg)    Vital signs reviewed   HEENT: very poor  dentition,  Nl turbinates, and orophanx. Nl external ear canals without cough reflex   NECK :  without JVD/Nodes/TM/ nl carotid upstrokes bilaterally   LUNGS: no acc muscle use, clear to A and P bilaterally without cough on insp or exp maneuvers - could not appreciate any bronchial bs over RML   CV:  RRR  no s3 or murmur or increase in P2, no edema   ABD:  soft and nontender with nl excursion in the supine position. No bruits or organomegaly, bowel sounds nl  MS:  warm without deformities, calf tenderness, cyanosis or clubbing  SKIN: warm and dry without lesions    NEURO:  alert, approp, no deficits      I personally reviewed images and agree with radiology impression as follows:  CXR:  10/22/2014         Assessment & Plan:

## 2014-10-22 NOTE — Assessment & Plan Note (Signed)
Body mass index is 32.33   Lab Results  Component Value Date   TSH 2.224 09/25/2008     Contributing to gerd tendency/ doe/reviewed the need and the process to achieve and maintain neg calorie balance > defer f/u primary care including intermittently monitoring thyroid status

## 2014-10-22 NOTE — Progress Notes (Signed)
Quick Note:  Spoke with pt and notified of results per Dr. Wert. Pt verbalized understanding and denied any questions.  ______ 

## 2014-10-22 NOTE — Assessment & Plan Note (Signed)
10/22/2014  Walked RA x 2 laps @ 185 ft each moderate pace/ min sob/ stopped due to knee pain    More evidence that she does not have a significant airflow problem off all rx

## 2014-10-22 NOTE — Assessment & Plan Note (Signed)
Onset of symptoms ? 07/2014  See CT chest 09/03/14 > levaquin 500 mg daily x 10 days > f/u cxr 09/21/2014 no change  - repeat 10/22/2014 > def improved > rec recheck in 4 weeks/no further abx

## 2014-10-22 NOTE — Assessment & Plan Note (Signed)
Trial off acei due to cough 09/11/2014 > restopped 10/05/2014 >  Better 10/22/2014   Cough much better but perceived need for saba apparently not > no change rx bystolic 5 mg daily x 4 more week samples

## 2014-10-22 NOTE — Assessment & Plan Note (Signed)
>>  ASSESSMENT AND PLAN FOR OBESITY WRITTEN ON 10/22/2014  5:41 PM BY Sherene SiresWERT, MICHAEL B, MD  Body mass index is 32.33   Lab Results  Component Value Date   TSH 2.224 09/25/2008     Contributing to gerd tendency/ doe/reviewed the need and the process to achieve and maintain neg calorie balance > defer f/u primary care including intermittently monitoring thyroid status

## 2014-11-03 NOTE — Assessment & Plan Note (Signed)
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   RML Consolidation: Follows with Dr. Melvyn Novas with pulmonary  RTC in 6 months

## 2014-11-04 ENCOUNTER — Ambulatory Visit (HOSPITAL_BASED_OUTPATIENT_CLINIC_OR_DEPARTMENT_OTHER): Payer: Medicare Other | Admitting: Hematology and Oncology

## 2014-11-04 ENCOUNTER — Encounter: Payer: Self-pay | Admitting: Hematology and Oncology

## 2014-11-04 ENCOUNTER — Other Ambulatory Visit: Payer: Self-pay | Admitting: *Deleted

## 2014-11-04 ENCOUNTER — Other Ambulatory Visit (HOSPITAL_BASED_OUTPATIENT_CLINIC_OR_DEPARTMENT_OTHER): Payer: Medicare Other

## 2014-11-04 ENCOUNTER — Telehealth: Payer: Self-pay | Admitting: Hematology and Oncology

## 2014-11-04 VITALS — BP 126/75 | HR 88 | Temp 98.4°F | Resp 18 | Ht 66.0 in | Wt 198.2 lb

## 2014-11-04 DIAGNOSIS — C8510 Unspecified B-cell lymphoma, unspecified site: Secondary | ICD-10-CM

## 2014-11-04 DIAGNOSIS — C8334 Diffuse large B-cell lymphoma, lymph nodes of axilla and upper limb: Secondary | ICD-10-CM | POA: Diagnosis present

## 2014-11-04 DIAGNOSIS — R0602 Shortness of breath: Secondary | ICD-10-CM | POA: Diagnosis not present

## 2014-11-04 DIAGNOSIS — B1920 Unspecified viral hepatitis C without hepatic coma: Secondary | ICD-10-CM

## 2014-11-04 LAB — COMPREHENSIVE METABOLIC PANEL (CC13)
ALBUMIN: 3.4 g/dL — AB (ref 3.5–5.0)
ALK PHOS: 92 U/L (ref 40–150)
ALT: 21 U/L (ref 0–55)
AST: 21 U/L (ref 5–34)
Anion Gap: 7 mEq/L (ref 3–11)
BILIRUBIN TOTAL: 0.37 mg/dL (ref 0.20–1.20)
BUN: 11.7 mg/dL (ref 7.0–26.0)
CO2: 24 meq/L (ref 22–29)
Calcium: 9.3 mg/dL (ref 8.4–10.4)
Chloride: 108 mEq/L (ref 98–109)
Creatinine: 0.8 mg/dL (ref 0.6–1.1)
GLUCOSE: 90 mg/dL (ref 70–140)
Potassium: 4.5 mEq/L (ref 3.5–5.1)
SODIUM: 139 meq/L (ref 136–145)
Total Protein: 7.8 g/dL (ref 6.4–8.3)

## 2014-11-04 LAB — CBC WITH DIFFERENTIAL/PLATELET
BASO%: 1.3 % (ref 0.0–2.0)
BASOS ABS: 0.1 10*3/uL (ref 0.0–0.1)
EOS ABS: 0.2 10*3/uL (ref 0.0–0.5)
EOS%: 3.8 % (ref 0.0–7.0)
HCT: 38.4 % (ref 34.8–46.6)
HEMOGLOBIN: 12.2 g/dL (ref 11.6–15.9)
LYMPH#: 1.9 10*3/uL (ref 0.9–3.3)
LYMPH%: 43.4 % (ref 14.0–49.7)
MCH: 24.7 pg — ABNORMAL LOW (ref 25.1–34.0)
MCHC: 31.9 g/dL (ref 31.5–36.0)
MCV: 77.4 fL — AB (ref 79.5–101.0)
MONO#: 0.4 10*3/uL (ref 0.1–0.9)
MONO%: 8.7 % (ref 0.0–14.0)
NEUT%: 42.8 % (ref 38.4–76.8)
NEUTROS ABS: 1.9 10*3/uL (ref 1.5–6.5)
Platelets: 186 10*3/uL (ref 145–400)
RBC: 4.95 10*6/uL (ref 3.70–5.45)
RDW: 15.6 % — ABNORMAL HIGH (ref 11.2–14.5)
WBC: 4.3 10*3/uL (ref 3.9–10.3)

## 2014-11-04 LAB — LACTATE DEHYDROGENASE (CC13): LDH: 147 U/L (ref 125–245)

## 2014-11-04 NOTE — Progress Notes (Signed)
Patient Care Team: [name], MD as PCP - General (Family Medicine)  DIAGNOSIS: No matching staging information was found for the patient.  SUMMARY OF ONCOLOGIC HISTORY:   Large B-cell lymphoma ([place])   01/05/2014 Initial Diagnosis Left axillary lymph node biopsy: Aggressive large B-cell lymphoma high-grade without CD10 or CD5 expression, positive for CD20, BCL-2 and BCL 6 and Ki-67 of 100% (DD: DLBCL vs B cell lymphoma unclassified)   01/16/2014 - 05/01/2014 Chemotherapy R CHOP chemotherapy 6   01/21/2014 PET scan PET CT scan done after cycle one RCHOP (urgent treatment needed) hypermetabolic bulky left supraclavicular and axillary lymphadenopathy. PET activity in the bone marrow and spleen thought to be related to chemotherapy rather than disease. (Stage 2B)   05/29/2014 PET scan Significant improvement, with reduction in size and activity of the left supraclavicular and left axillary lymph node ; reduced activity in the normal size spleen; and resolution of the diffuse hypermetabolic marrow activity.    CHIEF COMPLIANT: Chronic shortness of breath  INTERVAL HISTORY: Rachael West is a 59 year old with above-mentioned history of large B-cell lymphoma who had a complete response to chemotherapy but had abnormality in the lung which has been causing her chronic shortness of breath. She is following with pulmonary. Apart from that she has good energy levels, denies any fevers chills night sweats or weight loss. She had a tender area in the left axilla without any palpable lumps or nodules.  REVIEW OF SYSTEMS:   Constitutional: Denies fevers, chills or abnormal weight loss Eyes: Denies blurriness of vision Ears, nose, mouth, throat, and face: Denies mucositis or sore throat Respiratory: Shortness of breath or exertion Cardiovascular: Denies palpitation, chest discomfort or lower extremity swelling Gastrointestinal:  Denies nausea, heartburn or change in bowel habits Skin: Denies  abnormal skin rashes Lymphatics: Denies new lymphadenopathy or easy bruising Neurological:Denies numbness, tingling or new weaknesses Behavioral/Psych: Mood is stable, no new changes  Breast: Tender spot in the left axillary tail All other systems were reviewed with the patient and are negative.  I have reviewed the past medical history, past surgical history, social history and family history with the patient and they are unchanged from previous note.  ALLERGIES:  is allergic to latex; penicillins; codeine; and sulfa antibiotics.  MEDICATIONS:  Current Outpatient Prescriptions  Medication Sig Dispense Refill  . ALPRAZolam (XANAX) 0.25 MG tablet Take 0.25 mg by mouth 2 (two) times daily as needed.   1  . AMITIZA 8 MCG capsule 1 CAPSULE BY MOUTH TWICE A DAY  3  . amitriptyline (ELAVIL) 25 MG tablet Take 25 mg by mouth at bedtime.    Marland Kitchen amLODipine (NORVASC) 5 MG tablet Take 5 mg by mouth every morning.     . bimatoprost (LUMIGAN) 0.01 % SOLN Place 1 drop into both eyes at bedtime.    . docusate sodium (COLACE) 100 MG capsule 1 CAPSULE BY MOUTH DAILY  5  . hydrOXYzine (VISTARIL) 50 MG capsule Take 50 mg by mouth at bedtime.    . Ledipasvir-Sofosbuvir (HARVONI) 90-400 MG TABS Take 1 tablet by mouth daily. 28 tablet 2  . LORazepam (ATIVAN) 0.5 MG tablet Take 1 tablet (0.5 mg total) by mouth every 6 (six) hours as needed (Nausea or vomiting). 30 tablet 0  . meloxicam (MOBIC) 15 MG tablet Take 15 mg by mouth daily.      . mometasone-formoterol (DULERA) 100-5 MCG/ACT AERO Take 2 puffs first thing in am and then another 2 puffs about 12 hours later.  0  .  nebivolol (BYSTOLIC) 5 MG tablet Take 1 tablet (5 mg total) by mouth daily. (Patient not taking: Reported on 10/22/2014)  0  . paliperidone (INVEGA) 6 MG 24 hr tablet Take 6 mg by mouth every morning.    . pantoprazole (PROTONIX) 40 MG tablet Take 40 mg by mouth daily.    . prazosin (MINIPRESS) 1 MG capsule Take 1 mg by mouth at bedtime.    .  prochlorperazine (COMPAZINE) 10 MG tablet Take 1 tablet (10 mg total) by mouth every 6 (six) hours as needed (Nausea or vomiting). 30 tablet 6  . sertraline (ZOLOFT) 100 MG tablet Take 200 mg by mouth every morning.    . traMADol (ULTRAM) 50 MG tablet Take 50 mg by mouth 3 (three) times daily.    . traZODone (DESYREL) 100 MG tablet Take 100 mg by mouth at bedtime.       No current facility-administered medications for this visit.    PHYSICAL EXAMINATION: ECOG PERFORMANCE STATUS: 1 - Symptomatic but completely ambulatory  Filed Vitals:   11/04/14 1129  BP: 126/75  Pulse: 88  Temp: 98.4 F (36.9 C)  Resp: 18   Filed Weights   11/04/14 1129  Weight: 198 lb 3.2 oz (89.903 kg)    GENERAL:alert, no distress and comfortable SKIN: skin color, texture, turgor are normal, no rashes or significant lesions EYES: normal, Conjunctiva are pink and non-injected, sclera clear OROPHARYNX:no exudate, no erythema and lips, buccal mucosa, and tongue normal  NECK: supple, thyroid normal size, non-tender, without nodularity LYMPH:  no palpable lymphadenopathy in the cervical, axillary or inguinal LUNGS: clear to auscultation and percussion with normal breathing effort HEART: regular rate & rhythm and no murmurs and no lower extremity edema ABDOMEN:abdomen soft, non-tender and normal bowel sounds Musculoskeletal:no cyanosis of digits and no clubbing  NEURO: alert & oriented x 3 with fluent speech, no focal motor/sensory deficits BREAST: Left axilla was palpated and there is a tender spot without any palpable lumps or nodules. I encouraged the patient to get a mammogram.. (exam performed in the presence of a chaperone)  LABORATORY DATA:  I have reviewed the data as listed   Chemistry      Component Value Date/Time   NA 139 11/04/2014 1113   NA 136 08/10/2014 1222   K 4.5 11/04/2014 1113   K 4.5 08/10/2014 1222   CL 103 08/10/2014 1222   CO2 24 11/04/2014 1113   CO2 24 08/10/2014 1222   BUN  11.7 11/04/2014 1113   BUN 9 08/10/2014 1222   CREATININE 0.8 11/04/2014 1113   CREATININE 0.63 08/10/2014 1222   CREATININE 0.80 10/31/2010 2345      Component Value Date/Time   CALCIUM 9.3 11/04/2014 1113   CALCIUM 9.0 08/10/2014 1222   ALKPHOS 92 11/04/2014 1113   ALKPHOS 85 08/10/2014 1222   AST 21 11/04/2014 1113   AST 28 08/10/2014 1222   ALT 21 11/04/2014 1113   ALT 23 08/10/2014 1222   BILITOT 0.37 11/04/2014 1113   BILITOT 0.4 08/10/2014 1222       Lab Results  Component Value Date   WBC 4.3 11/04/2014   HGB 12.2 11/04/2014   HCT 38.4 11/04/2014   MCV 77.4* 11/04/2014   PLT 186 11/04/2014   NEUTROABS 1.9 11/04/2014   ASSESSMENT & PLAN:  Large B-cell lymphoma 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   RML Consolidation: Follows with Dr. Melvyn Novas with pulmonary Chronic hepatitis C: Liver function tests are adequate  I discussed with her that we would like to obtain another CT of her neck chest abdomen pelvis with contrast in 3 months in follow-up. Her blood work was reviewed from today without any significant changes.  RTC in 3 months after the scans   Orders Placed This Encounter  Procedures  . CT Abdomen Pelvis W Contrast    Standing Status: Future     Number of Occurrences:      Standing Expiration Date: 11/04/2015    Order Specific Question:  If indicated for  the ordered procedure, I authorize the administration of contrast media per Radiology protocol    Answer:  Yes    Order Specific Question:  Reason for Exam (SYMPTOM  OR DIAGNOSIS REQUIRED)    Answer:  Lymphoma restaging    Order Specific Question:  Preferred imaging location?    Answer:  [facility]  . CT Chest W Contrast    Standing Status: Future     Number of Occurrences:      Standing Expiration Date: 11/04/2015    Order Specific Question:  If indicated for the ordered procedure, I authorize the administration of contrast media per Radiology protocol    Answer:  Yes    Order Specific Question:  Reason for Exam (SYMPTOM  OR DIAGNOSIS REQUIRED)    Answer:  Lymphoma restaging    Order Specific Question:  Preferred imaging location?    Answer:  [facility]  . CT Soft Tissue Neck W Contrast    Standing Status: Future     Number of Occurrences:      Standing Expiration Date: 11/04/2015    Order Specific Question:  If indicated for the ordered procedure, I authorize the administration of contrast media per Radiology protocol    Answer:  Yes    Order Specific Question:  Reason for Exam (SYMPTOM  OR DIAGNOSIS REQUIRED)    Answer:  Lymphoma restaging    Order Specific Question:  Preferred imaging location?    Answer:  [facility]  . CBC with Differential    Standing Status: Future     Number of Occurrences:      Standing Expiration Date: 11/04/2015  . Comprehensive metabolic panel (Cmet) - [facility]    Standing Status: Future     Number of Occurrences:      Standing Expiration Date: 11/04/2015  . Lactate dehydrogenase (LDH) - [facility]    Standing Status: Future     Number of Occurrences:      Standing Expiration Date: 11/04/2015   The patient has a good understanding of the overall plan. she agrees with it. she will call with any problems that may develop before the next visit here.   Rulon Eisenmenger, MD 11/04/2014

## 2014-11-04 NOTE — Telephone Encounter (Signed)
Appointments made and avs printed for patient,contrast given to patient and i have left a message for Rachael West to have him add an order for IR

## 2014-11-05 ENCOUNTER — Ambulatory Visit (INDEPENDENT_AMBULATORY_CARE_PROVIDER_SITE_OTHER): Payer: Medicare Other | Admitting: Internal Medicine

## 2014-11-05 VITALS — BP 128/85 | HR 88 | Temp 98.2°F | Ht 66.0 in | Wt 198.0 lb

## 2014-11-05 DIAGNOSIS — B182 Chronic viral hepatitis C: Secondary | ICD-10-CM | POA: Diagnosis not present

## 2014-11-05 DIAGNOSIS — Z23 Encounter for immunization: Secondary | ICD-10-CM

## 2014-11-05 DIAGNOSIS — K7400 Hepatic fibrosis, unspecified: Secondary | ICD-10-CM

## 2014-11-05 NOTE — Progress Notes (Signed)
   Subjective:    Patient ID: Rachael West, female    DOB: Sep 16, 1955, 59 y.o.   MRN: [id number]  HPI Here for follow up of HCV.   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  Now has completed Harvoni.  Takes with ppi.  Viral load during treatment down to 443.  Now completed treatment.  Hepatitis A immune and B non-immune and getting vaccine series.    Review of Systems  Constitutional: Negative for fatigue.  Gastrointestinal: Negative for nausea and diarrhea.  Skin: Negative for rash.  Neurological: Negative for dizziness and light-headedness.       Objective:   Physical Exam  Constitutional: She appears well-developed and well-nourished.  HENT:  Mouth/Throat: No oropharyngeal exudate.  Eyes: No scleral icterus.  Cardiovascular: Normal rate, regular rhythm and normal heart sounds.   No murmur heard. Pulmonary/Chest: Effort normal and breath sounds normal. No respiratory distress.  Lymphadenopathy:    She has no cervical adenopathy.  Skin: No rash noted.          Assessment & Plan:

## 2014-11-05 NOTE — Assessment & Plan Note (Signed)
Now completed.  Will check end of treatment labs today.  RTC 4 months for [facility]

## 2014-11-05 NOTE — Assessment & Plan Note (Addendum)
Will refer to GI for ? Screening varices.  Has seen [facility] for screening colonoscopy so will send back to them.  Will do [place] screen every 6 months.   Will do pneumovax next visit.

## 2014-11-06 DIAGNOSIS — Z23 Encounter for immunization: Secondary | ICD-10-CM

## 2014-11-06 LAB — HEPATITIS C RNA QUANTITATIVE: HCV Quantitative: NOT DETECTED IU/mL (ref <15)

## 2014-11-10 ENCOUNTER — Other Ambulatory Visit: Payer: Self-pay | Admitting: Radiology

## 2014-11-11 ENCOUNTER — Ambulatory Visit (HOSPITAL_COMMUNITY)
Admission: RE | Admit: 2014-11-11 | Discharge: 2014-11-11 | Disposition: A | Discharge status: Home / Self Care | Payer: Medicare Other | Source: Ambulatory Visit | Attending: Hematology and Oncology | Admitting: Hematology and Oncology

## 2014-11-11 ENCOUNTER — Encounter (HOSPITAL_COMMUNITY): Payer: Self-pay

## 2014-11-11 DIAGNOSIS — Z880 Allergy status to penicillin: Secondary | ICD-10-CM | POA: Diagnosis not present

## 2014-11-11 DIAGNOSIS — Z882 Allergy status to sulfonamides status: Secondary | ICD-10-CM | POA: Insufficient documentation

## 2014-11-11 DIAGNOSIS — F419 Anxiety disorder, unspecified: Secondary | ICD-10-CM | POA: Diagnosis not present

## 2014-11-11 DIAGNOSIS — Z8572 Personal history of non-Hodgkin lymphomas: Secondary | ICD-10-CM | POA: Diagnosis not present

## 2014-11-11 DIAGNOSIS — Z452 Encounter for adjustment and management of vascular access device: Secondary | ICD-10-CM | POA: Insufficient documentation

## 2014-11-11 DIAGNOSIS — M1990 Unspecified osteoarthritis, unspecified site: Secondary | ICD-10-CM | POA: Diagnosis not present

## 2014-11-11 DIAGNOSIS — I10 Essential (primary) hypertension: Secondary | ICD-10-CM | POA: Insufficient documentation

## 2014-11-11 DIAGNOSIS — J45909 Unspecified asthma, uncomplicated: Secondary | ICD-10-CM | POA: Diagnosis not present

## 2014-11-11 DIAGNOSIS — D649 Anemia, unspecified: Secondary | ICD-10-CM | POA: Diagnosis not present

## 2014-11-11 DIAGNOSIS — Z91040 Latex allergy status: Secondary | ICD-10-CM | POA: Diagnosis not present

## 2014-11-11 DIAGNOSIS — Z87891 Personal history of nicotine dependence: Secondary | ICD-10-CM | POA: Insufficient documentation

## 2014-11-11 DIAGNOSIS — K219 Gastro-esophageal reflux disease without esophagitis: Secondary | ICD-10-CM | POA: Insufficient documentation

## 2014-11-11 DIAGNOSIS — C8510 Unspecified B-cell lymphoma, unspecified site: Secondary | ICD-10-CM

## 2014-11-11 LAB — CBC
HEMATOCRIT: 38.6 % (ref 36.0–46.0)
Hemoglobin: 12.2 g/dL (ref 12.0–15.0)
MCH: 24.9 pg — ABNORMAL LOW (ref 26.0–34.0)
MCHC: 31.6 g/dL (ref 30.0–36.0)
MCV: 78.9 fL (ref 78.0–100.0)
PLATELETS: 172 10*3/uL (ref 150–400)
RBC: 4.89 MIL/uL (ref 3.87–5.11)
RDW: 14.8 % (ref 11.5–15.5)
WBC: 4.1 10*3/uL (ref 4.0–10.5)

## 2014-11-11 LAB — BASIC METABOLIC PANEL
Anion gap: 5 (ref 5–15)
BUN: 9 mg/dL (ref 6–20)
CHLORIDE: 106 mmol/L (ref 101–111)
CO2: 26 mmol/L (ref 22–32)
CREATININE: 0.67 mg/dL (ref 0.44–1.00)
Calcium: 9 mg/dL (ref 8.9–10.3)
GFR calc Af Amer: >60 mL/min (ref >60)
GLUCOSE: 88 mg/dL (ref 65–99)
Potassium: 4.2 mmol/L (ref 3.5–5.1)
SODIUM: 137 mmol/L (ref 135–145)

## 2014-11-11 LAB — PROTIME-INR
INR: 1.12 (ref 0.00–1.49)
Prothrombin Time: 14.6 seconds (ref 11.6–15.2)

## 2014-11-11 LAB — APTT: APTT: 31 s (ref 24–37)

## 2014-11-11 MED ORDER — CEFAZOLIN SODIUM-DEXTROSE 2-3 GM-% IV SOLR
INTRAVENOUS | Status: AC
  Filled 2014-11-11: qty 50

## 2014-11-11 MED ORDER — MIDAZOLAM HCL 2 MG/2ML IJ SOLN
INTRAMUSCULAR | Status: AC
  Filled 2014-11-11: qty 6

## 2014-11-11 MED ORDER — SODIUM CHLORIDE 0.9 % IV SOLN
Freq: Once | INTRAVENOUS | Status: AC
  Administered 2014-11-11: 500 mL via INTRAVENOUS

## 2014-11-11 MED ORDER — MIDAZOLAM HCL 2 MG/2ML IJ SOLN
INTRAMUSCULAR | Status: AC | PRN
  Administered 2014-11-11 (×3): 1 mg via INTRAVENOUS

## 2014-11-11 MED ORDER — ALBUTEROL SULFATE (2.5 MG/3ML) 0.083% IN NEBU
2.5000 mg | INHALATION_SOLUTION | Freq: Once | RESPIRATORY_TRACT | Status: AC
Start: 2014-11-11 — End: 2014-11-11
  Administered 2014-11-11: 2.5 mg via RESPIRATORY_TRACT
  Filled 2014-11-11 (×2): qty 3

## 2014-11-11 MED ORDER — LIDOCAINE-EPINEPHRINE 2 %-1:100000 IJ SOLN
INTRAMUSCULAR | Status: AC
  Filled 2014-11-11: qty 1

## 2014-11-11 MED ORDER — FENTANYL CITRATE (PF) 100 MCG/2ML IJ SOLN
INTRAMUSCULAR | Status: AC
  Filled 2014-11-11: qty 4

## 2014-11-11 MED ORDER — FENTANYL CITRATE (PF) 100 MCG/2ML IJ SOLN
INTRAMUSCULAR | Status: AC | PRN
  Administered 2014-11-11 (×2): 50 ug via INTRAVENOUS

## 2014-11-11 MED ORDER — VANCOMYCIN HCL 10 G IV SOLR
1500.0000 mg | INTRAVENOUS | Status: AC
  Administered 2014-11-11: 1500 mg via INTRAVENOUS
  Filled 2014-11-11: qty 1000

## 2014-11-11 MED ORDER — LIDOCAINE HCL 1 % IJ SOLN
INTRAMUSCULAR | Status: AC
  Filled 2014-11-11: qty 20

## 2014-11-11 NOTE — H&P (Signed)
Chief Complaint: Patient was seen in consultation today for port removal at the request of [place]  Referring Physician(s): Gudena,Vinay  History of Present Illness: Rachael West is a 59 y.o. female with hx of lymphoma. She has completed therapy and is referred for removal of her port. PMHx, meds, labs, allergies noted. She reports asthma and forgot to use her inhalers today. She denies SOB, but says her breathing feels 'tight'. She requests breathing treatment. Has been NPO today  Past Medical History  Diagnosis Date  . Hypertension   . Asthma   . GERD (gastroesophageal reflux disease)   . Arthritis   . Anemia   . Anxiety   . Hepatitis c  . Cancer [facility]) 12-2013    aggressive lymphoma    Past Surgical History  Procedure Laterality Date  . Appendectomy    . Trigger finger release    . Esophagogastroduodenoscopy  01/17/2011    Procedure: ESOPHAGOGASTRODUODENOSCOPY (EGD);  Surgeon: Winfield Cunas., MD;  Location: [facility];  Service: Endoscopy;  Laterality: N/A;  . Colonoscopy  01/17/2011    Procedure: COLONOSCOPY;  Surgeon: Winfield Cunas., MD;  Location: [facility] ENDOSCOPY;  Service: Endoscopy;  Laterality: N/A;    Allergies: Latex; Penicillins; Codeine; and Sulfa antibiotics  Medications: Prior to Admission medications   Medication Sig Start Date End Date Taking? Authorizing Provider  bimatoprost (LUMIGAN) 0.01 % SOLN Place 1 drop into both eyes at bedtime.   Yes Historical Provider, MD  docusate sodium (COLACE) 100 MG capsule 1 CAPSULE BY MOUTH DAILY 08/10/14  Yes Historical Provider, MD  traMADol (ULTRAM) 50 MG tablet Take 50 mg by mouth 3 (three) times daily.   Yes Historical Provider, MD  ALPRAZolam (XANAX) 0.25 MG tablet Take 0.25 mg by mouth 2 (two) times daily as needed.  04/15/14   Historical Provider, MD  AMITIZA 8 MCG capsule 1 CAPSULE BY MOUTH TWICE A DAY 06/15/14   Historical Provider, MD  amitriptyline (ELAVIL) 25 MG tablet Take 25 mg by  mouth at bedtime.    Historical Provider, MD  amLODipine (NORVASC) 5 MG tablet Take 5 mg by mouth every morning.     Historical Provider, MD  hydrOXYzine (VISTARIL) 50 MG capsule Take 50 mg by mouth at bedtime.    Historical Provider, MD  LORazepam (ATIVAN) 0.5 MG tablet Take 1 tablet (0.5 mg total) by mouth every 6 (six) hours as needed (Nausea or vomiting). 01/12/14   Nicholas Lose, MD  meloxicam (MOBIC) 15 MG tablet Take 15 mg by mouth daily.      Historical Provider, MD  mometasone-formoterol (DULERA) 100-5 MCG/ACT AERO Take 2 puffs first thing in am and then another 2 puffs about 12 hours later. Patient not taking: Reported on 11/05/2014 09/11/14   Tanda Rockers, MD  nebivolol (BYSTOLIC) 5 MG tablet Take 1 tablet (5 mg total) by mouth daily. 10/05/14   Tanda Rockers, MD  omeprazole (PRILOSEC) 40 MG capsule 1 CAPSULE BY MOUTH DAILY 09/27/14   Historical Provider, MD  paliperidone (INVEGA) 6 MG 24 hr tablet Take 6 mg by mouth every morning.    Historical Provider, MD  pantoprazole (PROTONIX) 40 MG tablet Take 40 mg by mouth daily.    Historical Provider, MD  prazosin (MINIPRESS) 1 MG capsule Take 1 mg by mouth at bedtime.    Historical Provider, MD  prochlorperazine (COMPAZINE) 10 MG tablet Take 1 tablet (10 mg total) by mouth every 6 (six) hours as needed (Nausea or vomiting).  01/12/14   Nicholas Lose, MD  sertraline (ZOLOFT) 100 MG tablet Take 200 mg by mouth every morning.    Historical Provider, MD  traZODone (DESYREL) 100 MG tablet Take 100 mg by mouth at bedtime.      Historical Provider, MD     Family History  Problem Relation Age of Onset  . Asthma Mother   . Asthma Sister   . Allergies Mother   . Allergies Mother     Social History   Social History  . Marital Status: Divorced    Spouse Name: N/A  . Number of Children: N/A  . Years of Education: N/A   Occupational History  . Disabled    Social History Main Topics  . Smoking status: Former Smoker -- 1.50 packs/day for 20 years      Types: Cigarettes    Quit date: 01/09/1990  . Smokeless tobacco: Never Used  . Alcohol Use: No  . Drug Use: No  . Sexual Activity: Not Asked   Other Topics Concern  . None   Social History Narrative    Review of Systems: A 12 point ROS discussed and pertinent positives are indicated in the HPI above.  All other systems are negative.  Review of Systems  Vital Signs: BP 135/90 mmHg  Pulse 87  Temp(Src) 98.7 F (37.1 C)  Resp 18  Ht 5\' 6"  (1.676 m)  Wt 198 lb (89.812 kg)  BMI 31.97 kg/m2  SpO2 97%  Physical Exam  Constitutional: She is oriented to person, place, and time. She appears well-developed and well-nourished. No distress.  HENT:  Head: Normocephalic.  Mouth/Throat: Oropharynx is clear and moist.  Neck: Normal range of motion. No JVD present. No tracheal deviation present.  Cardiovascular: Normal rate, regular rhythm and normal heart sounds.   Pulmonary/Chest: Effort normal. No respiratory distress.  Few scattered wheezes  Neurological: She is alert and oriented to person, place, and time.  Skin: Skin is warm and dry.  Psychiatric: She has a normal mood and affect. Judgment normal.    Mallampati Score:  MD Evaluation Airway: WNL Heart: WNL Abdomen: WNL Chest/ Lungs: WNL ASA  Classification: 2 Mallampati/Airway Score: Two   Labs:  CBC:  Recent Labs  08/10/14 1222 09/01/14 1103 11/04/14 1113 11/11/14 1250  WBC 2.5* 4.4 4.3 4.1  HGB 11.6* 11.8 12.2 12.2  HCT 36.5 36.8 38.4 38.6  PLT 187 177 186 172    COAGS:  Recent Labs  01/12/14 1425 01/15/14 0730 11/11/14 1250  INR 1.14 1.13 1.12  APTT 37 37 31    Assessment and Plan: Hx lymphoma-treatment completed For port removal today. Discussed procedure, risks, complications, use of sedation. Asthma, few wheezes, will give albuterol nebulizer treatment prior to sedation procedure. Labs ok   SignedAscencion Dike 11/11/2014, 1:18 PM   I spent a total of  15 Minutes  in face to  face in clinical consultation, greater than 50% of which was counseling/coordinating care for port removal

## 2014-11-11 NOTE — Procedures (Signed)
Interventional Radiology Procedure Note  Procedure:  Port removal  Complications:  None  Estimated Blood Loss: < 10 mL  Right chest port removed in entirety.  No complications.  [name]. Kathlene Cote, M.D Pager:  [phone]

## 2014-11-11 NOTE — Discharge Instructions (Signed)
Moderate Conscious Sedation, Adult, Care After Refer to this sheet in the next few weeks. These instructions provide you with information on caring for yourself after your procedure. Your health care provider may also give you more specific instructions. Your treatment has been planned according to current medical practices, but problems sometimes occur. Call your health care provider if you have any problems or questions after your procedure. WHAT TO EXPECT AFTER THE PROCEDURE  After your procedure:  You may feel sleepy, clumsy, and have poor balance for several hours.  Vomiting may occur if you eat too soon after the procedure. HOME CARE INSTRUCTIONS  Do not participate in any activities where you could become injured for at least 24 hours. Do not:  Drive.  Swim.  Ride a bicycle.  Operate heavy machinery.  Cook.  Use power tools.  Climb ladders.  Work from a high place.  Do not make important decisions or sign legal documents until you are improved.  If you vomit, drink water, juice, or soup when you can drink without vomiting. Make sure you have little or no nausea before eating solid foods.  Only take over-the-counter or prescription medicines for pain, discomfort, or fever as directed by your health care provider.  Make sure you and your family fully understand everything about the medicines given to you, including what side effects may occur.  You should not drink alcohol, take sleeping pills, or take medicines that cause drowsiness for at least 24 hours.  If you smoke, do not smoke without supervision.  If you are feeling better, you may resume normal activities 24 hours after you were sedated.  Keep all appointments with your health care provider. SEEK MEDICAL CARE IF:  Your skin is pale or bluish in color.  You continue to feel nauseous or vomit.  Your pain is getting worse and is not helped by medicine.  You have bleeding or swelling.  You are still  sleepy or feeling clumsy after 24 hours. SEEK IMMEDIATE MEDICAL CARE IF:  You develop a rash.  You have difficulty breathing.  You develop any type of allergic problem.  You have a fever. MAKE SURE YOU:  Understand these instructions.  Will watch your condition.  Will get help right away if you are not doing well or get worse.   This information is not intended to replace advice given to you by your health care provider. Make sure you discuss any questions you have with your health care provider.   Document Released: 10/16/2012 Document Revised: 01/16/2014 Document Reviewed: 10/16/2012 Elsevier Interactive Patient Education 2016 Elsevier Inc.  Moderate Conscious Sedation, Adult Sedation is the use of medicines to promote relaxation and relieve discomfort and anxiety. Moderate conscious sedation is a type of sedation. Under moderate conscious sedation you are less alert than normal but are still able to respond to instructions or stimulation. Moderate conscious sedation is used during short medical and dental procedures. It is milder than deep sedation or general anesthesia and allows you to return to your regular activities sooner. LET [name] CARE PROVIDER KNOW ABOUT:   Any allergies you have.  All medicines you are taking, including vitamins, herbs, eye drops, creams, and over-the-counter medicines.  Use of steroids (by mouth or creams).  Previous problems you or members of your family have had with the use of anesthetics.  Any blood disorders you have.  Previous surgeries you have had.  Medical conditions you have.  Possibility of pregnancy, if this applies.  Use of  cigarettes, alcohol, or illegal drugs. RISKS AND COMPLICATIONS Generally, this is a safe procedure. However, as with any procedure, problems can occur. Possible problems include:  Oversedation.  Trouble breathing on your own. You may need to have a breathing tube until you are awake and breathing  on your own.  Allergic reaction to any of the medicines used for the procedure. BEFORE THE PROCEDURE  You may have blood tests done. These tests can help show how well your kidneys and liver are working. They can also show how well your blood clots.  A physical exam will be done.  Only take medicines as directed by your health care provider. You may need to stop taking medicines (such as blood thinners, aspirin, or nonsteroidal anti-inflammatory drugs) before the procedure.   Do not eat or drink at least 6 hours before the procedure or as directed by your health care provider.  Arrange for a responsible adult, family member, or friend to take you home after the procedure. He or she should stay with you for at least 24 hours after the procedure, until the medicine has worn off. PROCEDURE   An intravenous (IV) catheter will be inserted into one of your veins. Medicine will be able to flow directly into your body through this catheter. You may be given medicine through this tube to help prevent pain and help you relax.  The medical or dental procedure will be done. AFTER THE PROCEDURE  You will stay in a recovery area until the medicine has worn off. Your blood pressure and pulse will be checked.   Depending on the procedure you had, you may be allowed to go home when you can tolerate liquids and your pain is under control.   This information is not intended to replace advice given to you by your health care provider. Make sure you discuss any questions you have with your health care provider.   Document Released: 09/20/2000 Document Revised: 01/16/2014 Document Reviewed: 09/02/2012 Elsevier Interactive Patient Education 2016 [place] An incision is when a surgeon cuts into your body. After surgery, the incision needs to be cared for properly to prevent infection.  HOW TO CARE FOR YOUR INCISION  Take medicines only as directed by your health care  provider.  There are many different ways to close and cover an incision, including stitches, skin glue, and adhesive strips. Follow your health care provider's instructions on:  Incision care.  Bandage (dressing) changes and removal.  Incision closure removal.  Do not take baths, swim, or use a hot tub until your health care provider approves. You may shower as directed by your health care provider. May remove dressing and shower 24 hours post procedure. Keep site clean and dry.  Resume your normal diet and activities as directed.  Use anti-itch medicine (such as an antihistamine) as directed by your health care provider. The incision may itch while it is healing. Do not pick or scratch at the incision.  Drink enough fluid to keep your urine clear or pale yellow. SEEK MEDICAL CARE IF:   You have drainage, redness, swelling, or pain at your incision site.  You have muscle aches, chills, or a general ill feeling.  You notice a bad smell coming from the incision or dressing.  Your incision edges separate after the sutures, staples, or skin adhesive strips have been removed.  You have persistent nausea or vomiting.  You have a fever.  You are dizzy. SEEK IMMEDIATE MEDICAL CARE IF:  You have a rash.  You faint.  You have difficulty breathing. MAKE SURE YOU:   Understand these instructions.  Will watch your condition.  Will get help right away if you are not doing well or get worse.   This information is not intended to replace advice given to you by your health care provider. Make sure you discuss any questions you have with your health care provider.   Document Released: 07/15/2004 Document Revised: 01/16/2014 Document Reviewed: 02/19/2013 Elsevier Interactive Patient Education [employer].

## 2014-11-13 ENCOUNTER — Telehealth: Payer: Self-pay | Admitting: *Deleted

## 2014-11-13 NOTE — Telephone Encounter (Signed)
-----   Message from Thayer Headings, MD sent at 11/10/2014  5:16 PM EDT ----- Please let her know that her hep C virus is undetectable and so far so good.  Will check again at her next visit in 4 months to confirm cure. thanks

## 2014-11-13 NOTE — Telephone Encounter (Signed)
Relayed results to patient. Evonna Stoltz M, RN  

## 2014-11-16 ENCOUNTER — Telehealth: Payer: Self-pay | Admitting: *Deleted

## 2014-11-16 NOTE — Telephone Encounter (Signed)
Attempted to call [facility] to refer for gastro follow up.  Was sent to their phone room to discuss the patient.  Was placed on hold for 10+ minutes before hanging up.  Will try to connect again for this referral. Landis Gandy, RN

## 2014-11-20 ENCOUNTER — Encounter: Payer: Self-pay | Admitting: Internal Medicine

## 2014-11-20 ENCOUNTER — Ambulatory Visit (INDEPENDENT_AMBULATORY_CARE_PROVIDER_SITE_OTHER): Payer: Medicare Other | Admitting: Internal Medicine

## 2014-11-20 ENCOUNTER — Ambulatory Visit (INDEPENDENT_AMBULATORY_CARE_PROVIDER_SITE_OTHER)
Admission: RE | Admit: 2014-11-20 | Discharge: 2014-11-20 | Disposition: A | Discharge status: Home / Self Care | Payer: Medicare Other | Source: Ambulatory Visit | Attending: Internal Medicine | Admitting: Internal Medicine

## 2014-11-20 VITALS — BP 122/68 | HR 87 | Ht 66.0 in | Wt 204.0 lb

## 2014-11-20 DIAGNOSIS — J181 Lobar pneumonia, unspecified organism: Secondary | ICD-10-CM

## 2014-11-20 DIAGNOSIS — J4520 Mild intermittent asthma, uncomplicated: Secondary | ICD-10-CM

## 2014-11-20 DIAGNOSIS — E669 Obesity, unspecified: Secondary | ICD-10-CM | POA: Diagnosis not present

## 2014-11-20 DIAGNOSIS — I10 Essential (primary) hypertension: Secondary | ICD-10-CM

## 2014-11-20 DIAGNOSIS — R0600 Dyspnea, unspecified: Secondary | ICD-10-CM | POA: Diagnosis not present

## 2014-11-20 NOTE — Progress Notes (Signed)
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 Pulmonary Consult    Referred by Dr. Nicholas Lose. Pt states she was dxed with PNA x 1 wk ago. She c/o SOB, cough with bloody sputum and chest tightness.   overall worse since mid July 2016 with sob/cough never rx'd with abx, some chills some green mucus no documented fever no advair or saba day of ov and feels tight in chest s saba but pfts s airflow obst off rx x > 12 h  rec Stop lisinopril and advair For breathing problems try dulera 100 Take 2 puffs first thing in am and then another 2 puffs about 12 hours later.  Only use your albuterol (proair) as a rescue medication Levaquin 500 mg one daily x 10 days then return for cxr - call sooner if condition worsens HOLD [place]    09/21/2014 f/u ov/Rachael West re: RML pna  Chief Complaint  Patient presents with  . Follow-up    Pt c/o prod cough with clear mucus, chest tightness, wheezing and SOB. Sinus congestion. Denies any fever, nause, or vomitting.   overall better/ no more chills or yellow mucus/ no need for saba rec Continue dulera 100 Take 2 puffs first thing in am and then another 2 puffs about 12 hours later.  Work on inhaler technique:  relax and gently blow all the way out then take a nice smooth deep breath back in, triggering the inhaler at same time you start breathing in.   Hold for up to  Only use your albuterol (proair) as a rescue    10/05/2014  f/u ov/Rachael West re: breathing / coughing worse back on lisinopril  Chief Complaint  Patient presents with  . Follow-up    Pt reports breathing is getting worse, prod cough (clear), wheezing.   Not using ppi, back on acei,  Mucus is clear and not bloody at all.  Some nasal and ear congestion  rec Stop lisinopril Bystolic 5 mg daily instead of lisinopril  Continue dulera 100 Take 2 puffs first thing in am and then another 2 puffs about 12 hours later.  Work on inhaler technique:   Continue prilosec 40 mg Take 30-60 min before first meal of the day and over the counter pepcid ac 20 mg at bedtime For cough or pain anywhere ok to take ultram 50mg  up to every 4 hours Please schedule a follow up office visit in 2 weeks, sooner if needed with all medications in hand    10/22/2014 extended  f/u ov/Rachael West re: pna/ ? Psueudasthma?  - did not bring all meds  Chief Complaint  Patient presents with  . Follow-up    Breathing is unchanged. Cough has improved some and is non prod. She c/o congestion in her throat and nose today.  Out of all inhalers since one day prior to OV - never stopped advair as rec but did not take am of ov - did not bring all meds /   despite advair and dulera was still using saba every few hours until she ran out rec Continue bystolic 5 mg daily (samples x 4 weeks)  There is no evidence of asthma here so no need for advair or dulera at this point  Only use your albuterol as a rescue medication     11/20/2014  f/u ov/Rachael West re: unexplained sob and cough / only brought ventolin/ did not know it's the same drug  as proair also using frequently  Chief Complaint  Patient presents with  . Follow-up    Reports breathing is worse, has dry cough, nasal cong, PND, chest cong. no f/c/n/v/s    Using albuterol about 4-5 day but never p hs or early am while   Last used 4 h prior and feels need for another dose at  time of ov despite perfectly nl spirometry   No obvious day to day or daytime variability or  Cp/excess mucus production /  overt  hb symptoms. No unusual exp hx or h/o childhood pna/ asthma or knowledge of premature birth.  Sleeping ok without nocturnal  or early am exacerbation  of respiratory  c/o's or need for noct saba. Also denies any obvious fluctuation of symptoms with weather or environmental changes or other aggravating or alleviating factors except as outlined above   Current Medications, Allergies, Complete Past Medical History, Past Surgical History, Family History, and Social History were reviewed in [employer] record.  ROS  The following are not active complaints unless bolded sore throat, dysphagia, dental problems, itching, sneezing,  nasal congestion or excess/ purulent secretions, ear ache,   fever, chills, sweats, unintended wt loss, classically pleuritic or exertional cp, hemoptysis,  orthopnea pnd or leg swelling, presyncope, palpitations, abdominal pain, anorexia, nausea, vomiting, diarrhea  or change in bowel or bladder habits, change in stools or urine, dysuria,hematuria,  rash, arthralgias, visual complaints, headache, numbness, weakness or ataxia or problems with walking or coordination,  change in mood/affect or memory.           Objective:  Physical Exam  amb bf nad very abn affect   09/21/2014        196  > 10/05/2014  201> 10/22/2014 200 > 11/20/2014   204     09/11/14 196 lb 3.2 oz (88.996 kg)  09/08/14 193 lb (87.544 kg)  09/04/14 197 lb 1.6 oz (89.404 kg)    Vital signs reviewed   HEENT: very poor  dentition,  Nl turbinates, and orophanx. Nl external ear canals without cough reflex   NECK :  without JVD/Nodes/TM/ nl carotid upstrokes bilaterally   LUNGS: no acc muscle use, clear to A and P bilaterally without cough on insp or exp maneuvers - could not appreciate any bronchial bs over RML   CV:  RRR  no s3 or murmur or  increase in P2, no edema   ABD:  soft and nontender with nl excursion in the supine position. No bruits or organomegaly, bowel sounds nl  MS:  warm without deformities, calf tenderness, cyanosis or clubbing  SKIN: warm and dry without lesions    NEURO:  alert, approp, no deficits      I personally reviewed images and agree with radiology impression as follows:  CXR:  11/20/2014 Persistent abnormal density in the right middle lobe. This  has improved slightly since the study of October 13th and considerably improved since a study of September 21, 2014.      Assessment & Plan:

## 2014-11-20 NOTE — Patient Instructions (Addendum)
Be sure you take pepcid ac 20 mg at bedtime  GERD (REFLUX)  is an extremely common cause of respiratory symptoms just like yours , many times with no obvious heartburn at all.    It can be treated with medication, but also with lifestyle changes including elevation of the head of your bed (ideally with 6 inch  bed blocks),  Smoking cessation, avoidance of late meals, excessive alcohol, and avoid fatty foods, chocolate, peppermint, colas, red wine, and acidic juices such as orange juice.  NO MINT OR MENTHOL PRODUCTS SO NO COUGH DROPS  USE SUGARLESS CANDY INSTEAD (Jolley ranchers or Stover's or Life Savers) or even ice chips will also do - the key is to swallow to prevent all throat clearing. NO OIL BASED VITAMINS - use powdered substitutes.   Only use your albuterol (ventolin or proair are the same) as a rescue medication to be used if you can't catch your breath by resting or doing a relaxed purse lip breathing pattern.  - The less you use it, the better it will work when you need it. - Ok to use up to 2 puffs  every 4 hours if you must but call for immediate appointment if use goes up over your usual need - Don't leave home without it !!  (think of it like the spare tire for your car)   Please remember to go to the  x-ray department downstairs for your tests - we will call you with the results when they are available.      If you are satisfied with your treatment plan,  let your doctor know and he/she can either refill your medications or you can return here when your prescription runs out.  (bystolic 5 mg daily x 6 weeks samples only so need to see your primary before the samples run out )    If in any way you are not 100% satisfied,  please tell us.  If 100% better, tell your friends!  Pulmonary follow up is as needed

## 2014-11-22 ENCOUNTER — Encounter: Payer: Self-pay | Admitting: Internal Medicine

## 2014-11-22 NOTE — Assessment & Plan Note (Signed)
10/22/2014  Walked RA x 2 laps @ 185 ft each moderate pace/ min sob/ stopped due to knee pain   -  11/20/2014   Walked RA  2 laps @ 185 ft each stopped due to tired/ min sob/ no desat at slow pace  Strongly suspect deconditioning here

## 2014-11-22 NOTE — Assessment & Plan Note (Addendum)
-   spirometry 09/11/2014 > wnl including fef 25-75 off all rx > 12 h with active symptoms of chest tightness  - Trial off acei 09/11/2014 > restopped 10/05/2014  - 09/21/2014 p extensive coaching HFA effectiveness =    75%  - repeat spirometry off all rx 10/22/2014 > wnl before her "q4h" saba   The proper method of use, as well as anticipated side effects, of a metered-dose inhaler are discussed and demonstrated to the patient. Improved effectiveness after extensive coaching during this visit to a level of approximately  75% from a baseline of 50% so really not getting as much saba in as she reports.  No evidence at all to support asthma here:  No noct symptoms, nl spirometry x 2 now with active symptoms so doubt she really needs saba at all but at least she knows how to use it if she ever really needs it.   I had an extended discussion with the patient reviewing all relevant studies completed to date and  lasting 15 to 20 minutes of a 25 minute visit    Each maintenance medication was reviewed in detail including most importantly the difference between maintenance and prns and under what circumstances the prns are to be triggered using an action plan format that is not reflected in the computer generated alphabetically organized AVS.    Please see instructions for details which were reviewed in writing and the patient given a copy highlighting the part that I personally wrote and discussed at today's ov.

## 2014-11-22 NOTE — Assessment & Plan Note (Signed)
>>  ASSESSMENT AND PLAN FOR OBESITY WRITTEN ON 11/22/2014  6:05 AM BY Sherene SiresWERT, MICHAEL B, MD  Body mass index is 32.94 trending up   Lab Results  Component Value Date   TSH 2.224 09/25/2008     Contributing to gerd tendency/ doe/reviewed the need and the process to achieve and maintain neg calorie balance > defer f/u primary care including intermittently monitoring thyroid status

## 2014-11-22 NOTE — Assessment & Plan Note (Addendum)
Body mass index is 32.94 trending up   Lab Results  Component Value Date   TSH 2.224 09/25/2008     Contributing to gerd tendency/ doe/reviewed the need and the process to achieve and maintain neg calorie balance > defer f/u primary care including intermittently monitoring thyroid status

## 2014-11-22 NOTE — Assessment & Plan Note (Signed)
Onset of symptoms ? 07/2014  See CT chest 09/03/14 > levaquin 500 mg daily x 10 days > f/u cxr 09/21/2014 no change  - repeat 10/22/2014 > def improved > rec recheck in 4 weeks/no further abx  - repeat 11/20/2014 very scant residual change RML / marked serial improvement > no further studies needed   Discussed in detail all the  indications, usual  risks and alternatives  relative to the benefits with patient who agrees to proceed with conservative f/u = only repeat studies if symptoms worsen. The RML is commonly the hardest one to completely clear p pna due to poor collateral ventilation.

## 2014-11-22 NOTE — Assessment & Plan Note (Signed)
Trial off acei due to cough 09/11/2014 > restopped 10/05/2014 > ? Better 10/22/2014   For now on bystolic 5 mg samples > Follow up per Primary Care planned  Before samples run out.  Would avoid acei here but since not convinced she has asthma should be able to use lopressor if desired - would avoid the most non-selective BB though like propranolol or nadolol

## 2014-12-01 ENCOUNTER — Telehealth: Payer: Self-pay | Admitting: *Deleted

## 2014-12-01 NOTE — Telephone Encounter (Signed)
Patient is scheduled with Dr. Oletta Lamas at [place] on 12/28 at 2:30.  Patient notified, accepted. Notes faxed to Dr. Johnnette Gourd attention. Landis Gandy, RN

## 2015-02-01 ENCOUNTER — Other Ambulatory Visit (HOSPITAL_BASED_OUTPATIENT_CLINIC_OR_DEPARTMENT_OTHER): Payer: Medicare Other

## 2015-02-01 DIAGNOSIS — C8334 Diffuse large B-cell lymphoma, lymph nodes of axilla and upper limb: Secondary | ICD-10-CM | POA: Diagnosis present

## 2015-02-01 DIAGNOSIS — C8510 Unspecified B-cell lymphoma, unspecified site: Secondary | ICD-10-CM

## 2015-02-01 LAB — CBC WITH DIFFERENTIAL/PLATELET
BASO%: 0.8 % (ref 0.0–2.0)
BASOS ABS: 0 10*3/uL (ref 0.0–0.1)
EOS ABS: 0.2 10*3/uL (ref 0.0–0.5)
EOS%: 3.2 % (ref 0.0–7.0)
HCT: 37 % (ref 34.8–46.6)
HEMOGLOBIN: 12 g/dL (ref 11.6–15.9)
LYMPH%: 35.7 % (ref 14.0–49.7)
MCH: 25.1 pg (ref 25.1–34.0)
MCHC: 32.3 g/dL (ref 31.5–36.0)
MCV: 77.8 fL — AB (ref 79.5–101.0)
MONO#: 0.5 10*3/uL (ref 0.1–0.9)
MONO%: 11.1 % (ref 0.0–14.0)
NEUT#: 2.4 10*3/uL (ref 1.5–6.5)
NEUT%: 49.2 % (ref 38.4–76.8)
PLATELETS: 186 10*3/uL (ref 145–400)
RBC: 4.76 10*6/uL (ref 3.70–5.45)
RDW: 15 % — ABNORMAL HIGH (ref 11.2–14.5)
WBC: 4.9 10*3/uL (ref 3.9–10.3)
lymph#: 1.8 10*3/uL (ref 0.9–3.3)

## 2015-02-01 LAB — COMPREHENSIVE METABOLIC PANEL
ALT: 13 U/L (ref 0–55)
ANION GAP: 6 meq/L (ref 3–11)
AST: 16 U/L (ref 5–34)
Albumin: 3.1 g/dL — ABNORMAL LOW (ref 3.5–5.0)
Alkaline Phosphatase: 91 U/L (ref 40–150)
BILIRUBIN TOTAL: 0.33 mg/dL (ref 0.20–1.20)
BUN: 7.9 mg/dL (ref 7.0–26.0)
CALCIUM: 9 mg/dL (ref 8.4–10.4)
CO2: 26 meq/L (ref 22–29)
CREATININE: 0.8 mg/dL (ref 0.6–1.1)
Chloride: 108 mEq/L (ref 98–109)
Glucose: 97 mg/dl (ref 70–140)
Potassium: 3.8 mEq/L (ref 3.5–5.1)
Sodium: 140 mEq/L (ref 136–145)
TOTAL PROTEIN: 7.4 g/dL (ref 6.4–8.3)

## 2015-02-01 LAB — LACTATE DEHYDROGENASE: LDH: 132 U/L (ref 125–245)

## 2015-02-02 ENCOUNTER — Ambulatory Visit (HOSPITAL_COMMUNITY)
Admission: RE | Admit: 2015-02-02 | Discharge: 2015-02-02 | Disposition: A | Discharge status: Home / Self Care | Payer: Medicare Other | Source: Ambulatory Visit | Attending: Hematology and Oncology | Admitting: Hematology and Oncology

## 2015-02-02 DIAGNOSIS — C8510 Unspecified B-cell lymphoma, unspecified site: Secondary | ICD-10-CM | POA: Diagnosis not present

## 2015-02-02 DIAGNOSIS — J984 Other disorders of lung: Secondary | ICD-10-CM | POA: Insufficient documentation

## 2015-02-02 DIAGNOSIS — R160 Hepatomegaly, not elsewhere classified: Secondary | ICD-10-CM | POA: Diagnosis not present

## 2015-02-02 MED ORDER — IOHEXOL 300 MG/ML  SOLN
100.0000 mL | Freq: Once | INTRAMUSCULAR | Status: AC | PRN
  Administered 2015-02-02: 100 mL via INTRAVENOUS

## 2015-02-04 ENCOUNTER — Encounter: Payer: Self-pay | Admitting: Hematology and Oncology

## 2015-02-04 ENCOUNTER — Ambulatory Visit (HOSPITAL_BASED_OUTPATIENT_CLINIC_OR_DEPARTMENT_OTHER): Payer: Medicare Other | Admitting: Hematology and Oncology

## 2015-02-04 VITALS — BP 112/66 | HR 90 | Temp 98.9°F | Resp 18 | Ht 66.0 in | Wt 203.0 lb

## 2015-02-04 DIAGNOSIS — C8334 Diffuse large B-cell lymphoma, lymph nodes of axilla and upper limb: Secondary | ICD-10-CM | POA: Diagnosis not present

## 2015-02-04 DIAGNOSIS — B182 Chronic viral hepatitis C: Secondary | ICD-10-CM | POA: Diagnosis not present

## 2015-02-04 DIAGNOSIS — C8510 Unspecified B-cell lymphoma, unspecified site: Secondary | ICD-10-CM

## 2015-02-04 NOTE — Progress Notes (Signed)
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 PET activity in the bone marrow and spleen thought to be related to chemotherapy rather than disease. (Stage 2B)   05/29/2014 PET scan Significant improvement, with reduction in size and activity of the left supraclavicular and left axillary lymph node ; reduced activity in the normal size spleen; and resolution of the diffuse hypermetabolic marrow activity.   02/02/2015 Imaging Borderline left axillary lymph node same size, no abdominal or pelvic lymphadenopathy, hepatomegaly mild cirrhosis , improving right middle lobe airspace disease. No evidence of lymphoma    CHIEF COMPLIANT: follow-up of  large B-cell lymphoma after scans  INTERVAL HISTORY: Rachael West is a 33 with above-mentioned history of aggressive large B-cell lymphoma that was treated with our CHOP 6 cycles who is currently on surveillance. She reports no major problems. Her respiratory issues are much improved. Energy levels are slowly improving. She continues to have muscle cramps in neuropathy symptoms related to prior chemotherapy. Recent CT scans did not reveal any evidence of lymphoma. She continues to have small stable axillary lymph nodes.  REVIEW OF SYSTEMS:   Constitutional: Denies fevers, chills or abnormal weight loss Eyes: Denies blurriness of vision Ears,  nose, mouth, throat, and face: Denies mucositis or sore throat Respiratory: Denies cough, dyspnea or wheezes Cardiovascular: Denies palpitation, chest discomfort Gastrointestinal:  Denies nausea, heartburn or change in bowel habits Skin: Denies abnormal skin rashes Lymphatics: Denies new lymphadenopathy or easy bruising Neurological:neuropathy in the hands Behavioral/Psych: Mood is stable, no new changes  Extremities: No lower extremity edema  All other systems were reviewed with the patient and are negative.  I have reviewed the past medical history, past surgical history, social history and family history with the patient and they are unchanged from previous note.  ALLERGIES:  is allergic to latex; penicillins; codeine; cortisone; and sulfa antibiotics.  MEDICATIONS:  Current Outpatient Prescriptions  Medication Sig Dispense Refill  . ALPRAZolam (XANAX) 0.25 MG tablet Take 0.25 mg by mouth 2 (two) times daily as needed.   1  . AMITIZA 8 MCG capsule 1 CAPSULE BY MOUTH TWICE A DAY  3  . amitriptyline (ELAVIL) 25 MG tablet Take 25 mg by mouth at bedtime.    Marland Kitchen amLODipine (NORVASC) 5 MG tablet Take 5 mg by mouth every morning.     . bimatoprost (LUMIGAN) 0.01 % SOLN Place 1 drop into both eyes at bedtime.    . hydrOXYzine (VISTARIL) 50 MG capsule Take 50 mg by mouth at bedtime.    . meloxicam (MOBIC) 15 MG tablet Take 15 mg by mouth daily.      . nebivolol (BYSTOLIC) 5 MG tablet Take 1 tablet (5 mg total) by mouth daily.  0  . omeprazole (PRILOSEC) 40 MG capsule 1 CAPSULE BY MOUTH DAILY  3  . paliperidone (INVEGA) 6 MG 24 hr tablet Take 6 mg by mouth every morning.    . pantoprazole (PROTONIX)  40 MG tablet Take 40 mg by mouth daily.    . prazosin (MINIPRESS) 1 MG capsule Take 1 mg by mouth at bedtime.    . prochlorperazine (COMPAZINE) 10 MG tablet Take 1 tablet (10 mg total) by mouth every 6 (six) hours as needed (Nausea or vomiting). 30 tablet 6  . sertraline (ZOLOFT) 100 MG tablet Take  200 mg by mouth every morning.    . traMADol (ULTRAM) 50 MG tablet Take 50 mg by mouth 3 (three) times daily.    . traZODone (DESYREL) 100 MG tablet Take 100 mg by mouth at bedtime.       No current facility-administered medications for this visit.    PHYSICAL EXAMINATION: ECOG PERFORMANCE STATUS: 1 - Symptomatic but completely ambulatory  Filed Vitals:   02/04/15 0933  BP: 112/66  Pulse: 90  Temp: 98.9 F (37.2 C)  Resp: 18   Filed Weights   02/04/15 0933  Weight: 203 lb (92.08 kg)    GENERAL:alert, no distress and comfortable SKIN: skin color, texture, turgor are normal, no rashes or significant lesions EYES: normal, Conjunctiva are pink and non-injected, sclera clear OROPHARYNX:no exudate, no erythema and lips, buccal mucosa, and tongue normal  NECK: supple, thyroid normal size, non-tender, without nodularity LYMPH:  no palpable lymphadenopathy in the cervical, axillary or inguinal LUNGS: clear to auscultation and percussion with normal breathing effort HEART: regular rate & rhythm and no murmurs and no lower extremity edema ABDOMEN:abdomen soft, non-tender and normal bowel sounds MUSCULOSKELETAL:no cyanosis of digits and no clubbing  NEURO: alert & oriented x 3 with fluent speech, grade 1 peripheral sensory neuropathy EXTREMITIES: No lower extremity edema  LABORATORY DATA:  I have reviewed the data as listed   Chemistry      Component Value Date/Time   NA 140 02/01/2015 0900   NA 137 11/11/2014 1250   K 3.8 02/01/2015 0900   K 4.2 11/11/2014 1250   CL 106 11/11/2014 1250   CO2 26 02/01/2015 0900   CO2 26 11/11/2014 1250   BUN 7.9 02/01/2015 0900   BUN 9 11/11/2014 1250   CREATININE 0.8 02/01/2015 0900   CREATININE 0.67 11/11/2014 1250   CREATININE 0.63 08/10/2014 1222      Component Value Date/Time   CALCIUM 9.0 02/01/2015 0900   CALCIUM 9.0 11/11/2014 1250   ALKPHOS 91 02/01/2015 0900   ALKPHOS 85 08/10/2014 1222   AST 16 02/01/2015 0900   AST 28  08/10/2014 1222   ALT 13 02/01/2015 0900   ALT 23 08/10/2014 1222   BILITOT 0.33 02/01/2015 0900   BILITOT 0.4 08/10/2014 1222       Lab Results  Component Value Date   WBC 4.9 02/01/2015   HGB 12.0 02/01/2015   HCT 37.0 02/01/2015   MCV 77.8* 02/01/2015   PLT 186 02/01/2015   NEUTROABS 2.4 02/01/2015     ASSESSMENT & PLAN:  Large B-cell lymphoma Large B-cell lymphoma Aggressive large B-cell lymphoma: CD5 and 10 negative, CD20 positive BCL 2 and BCL 6 positive: Differential diagnosis aggressive large B-cell lymphoma versus B-cell lymphoma unclassified, Ki-67 100%, bulky lymphadenopathy supraclavicular and axillary lymph nodes, bone marrow negative, stage IIB   Treatment Summary: R CHOP chemotherapy started 01/16/2014 completed 05/01/14  PET-CT 05/29/14: Near CR  CT chest abdomen pelvis 01/24/2017Borderline left axillary lymph node same size, no abdominal or pelvic lymphadenopathy, hepatomegaly mild cirrhosis , improving right middle lobe airspace disease. No evidence of lymphoma  RML Consolidation: Follows with Dr. Melvyn Novas with pulmonary , CT  scan of the chest reveals improvement in airspace disease Chronic hepatitis C: Liver function tests are adequate,  CT abdomen suggests mild cirrhosis. Patient has appointment with gastroenterology.   Return to clinic in 6 months for follow-up CT scans.   Orders Placed This Encounter  Procedures  . CT Abdomen W Contrast    Standing Status: Future     Number of Occurrences:      Standing Expiration Date: 02/04/2016    Order Specific Question:  If indicated for the ordered procedure, I authorize the administration of contrast media per Radiology protocol    Answer:  Yes    Order Specific Question:  Reason for Exam (SYMPTOM  OR DIAGNOSIS REQUIRED)    Answer:  restaging B cell Lymphoma    Order Specific Question:  Preferred imaging location?    Answer:  [facility]  . CT Chest W Contrast    Standing Status: Future     Number of  Occurrences:      Standing Expiration Date: 02/04/2016    Order Specific Question:  If indicated for the ordered procedure, I authorize the administration of contrast media per Radiology protocol    Answer:  Yes    Order Specific Question:  Reason for Exam (SYMPTOM  OR DIAGNOSIS REQUIRED)    Answer:  restaging B cell Lymphoma    Order Specific Question:  Preferred imaging location?    Answer:  [facility]  . CBC with Differential    Standing Status: Future     Number of Occurrences:      Standing Expiration Date: 02/04/2016  . Lactate dehydrogenase (LDH) - [facility]    Standing Status: Future     Number of Occurrences:      Standing Expiration Date: 02/04/2016  . Comprehensive metabolic panel    Standing Status: Future     Number of Occurrences:      Standing Expiration Date: 02/04/2016   The patient has a good understanding of the overall plan. she agrees with it. she will call with any problems that may develop before the next visit here.   Rulon Eisenmenger, MD 02/04/2015

## 2015-02-04 NOTE — Assessment & Plan Note (Signed)
Large B-cell lymphoma Aggressive large B-cell lymphoma: CD5 and 10 negative, CD20 positive BCL 2 and BCL 6 positive: Differential diagnosis aggressive large B-cell lymphoma versus B-cell lymphoma unclassified, Ki-67 100%, bulky lymphadenopathy supraclavicular and axillary lymph nodes, bone marrow negative, stage IIB   Treatment Summary: R CHOP chemotherapy started 01/16/2014 completed 05/01/14  PET-CT 05/29/14: Near CR  CT chest abdomen pelvis 01/24/2017Borderline left axillary lymph node same size, no abdominal or pelvic lymphadenopathy, hepatomegaly mild cirrhosis , improving right middle lobe airspace disease. No evidence of lymphoma  RML Consolidation: Follows with Dr. Melvyn Novas with pulmonary , CT scan of the chest reveals improvement in airspace disease Chronic hepatitis C: Liver function tests are adequate,  CT abdomen suggests mild cirrhosis. Will refer to gastroenterology.   Return to clinic in 6 months for follow-up CT scans.

## 2015-02-04 NOTE — Addendum Note (Signed)
Addended by: Prentiss Bells on: 02/04/2015 10:04 AM   Modules accepted: Medications

## 2015-03-09 ENCOUNTER — Encounter: Payer: Self-pay | Admitting: Internal Medicine

## 2015-03-09 ENCOUNTER — Ambulatory Visit (INDEPENDENT_AMBULATORY_CARE_PROVIDER_SITE_OTHER): Payer: Medicare Other | Admitting: Internal Medicine

## 2015-03-09 VITALS — BP 116/78 | HR 84 | Temp 98.0°F | Ht 66.0 in | Wt 204.0 lb

## 2015-03-09 DIAGNOSIS — Z23 Encounter for immunization: Secondary | ICD-10-CM | POA: Diagnosis not present

## 2015-03-09 DIAGNOSIS — B182 Chronic viral hepatitis C: Secondary | ICD-10-CM | POA: Diagnosis not present

## 2015-03-09 DIAGNOSIS — K7460 Unspecified cirrhosis of liver: Secondary | ICD-10-CM | POA: Diagnosis not present

## 2015-03-09 NOTE — Assessment & Plan Note (Addendum)
Needs [place] screening, just had CT so will do [place] in 6 months.  I will get note from Dr. Oletta Lamas to see if EGD is planned or not.  Patient tells me she was told she didn't need anything for 10 years (likely colonoscopy).

## 2015-03-09 NOTE — Progress Notes (Signed)
   Subjective:    Patient ID: Rachael West, female    DOB: 10-21-1955, 59 y.o.   MRN: [id number]  HPI Here for follow up of HCV.   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  Now has completed Harvoni.   Viral load during treatment down to 443 and undetectable after treatment completion.   Hepatitis A immune and B non-immune and getting vaccine series.  Saw Dr. Oletta Lamas of GI and told she did not need an EGD and just repeat colonoscopy at 10 years.  Had a CT scan of abd and does show possible cirrhosis.  Albumin low, Child Pugh A.     Review of Systems  Constitutional: Negative for fatigue.  Gastrointestinal: Negative for nausea and diarrhea.  Skin: Negative for rash.  Neurological: Negative for dizziness and light-headedness.       Objective:   Physical Exam  Constitutional: She appears well-developed and well-nourished.  HENT:  Mouth/Throat: No oropharyngeal exudate.  Eyes: No scleral icterus.  Cardiovascular: Normal rate, regular rhythm and normal heart sounds.   No murmur heard. Pulmonary/Chest: Effort normal and breath sounds normal. No respiratory distress.  Lymphadenopathy:    She has no cervical adenopathy.  Skin: No rash noted.     Social History   Social History  . Marital Status: Divorced    Spouse Name: N/A  . Number of Children: N/A  . Years of Education: N/A   Occupational History  . Disabled    Social History Main Topics  . Smoking status: Former Smoker -- 1.50 packs/day for 20 years    Types: Cigarettes    Quit date: 01/09/1990  . Smokeless tobacco: Never Used  . Alcohol Use: No  . Drug Use: No  . Sexual Activity: Not on file   Other Topics Concern  . Not on file   Social History Narrative       Assessment & Plan:

## 2015-03-09 NOTE — Assessment & Plan Note (Signed)
Doing well and will check for SVR12 and test of cure.

## 2015-03-10 LAB — HEPATITIS C RNA QUANTITATIVE: HCV Quantitative: NOT DETECTED IU/mL (ref <15)

## 2015-03-11 ENCOUNTER — Telehealth: Payer: Self-pay | Admitting: *Deleted

## 2015-03-11 NOTE — Telephone Encounter (Signed)
Notified patient. Contacted [facility] for clarification of initial referral.  Resent records for EGD request. Landis Gandy, RN

## 2015-03-11 NOTE — Telephone Encounter (Signed)
-----   Message from Thayer Headings, MD sent at 03/11/2015  8:24 AM EST ----- Please let her know her last HCV virus remains undetectable and she is now considered cured.  thanks

## 2015-03-12 NOTE — Addendum Note (Signed)
Addended by: Landis Gandy on: 03/12/2015 12:32 PM   Modules accepted: Orders

## 2015-07-20 ENCOUNTER — Ambulatory Visit: Payer: Medicare Other | Admitting: Hematology and Oncology

## 2015-07-20 ENCOUNTER — Other Ambulatory Visit: Payer: Medicare Other

## 2015-07-21 ENCOUNTER — Other Ambulatory Visit: Payer: Self-pay

## 2015-07-30 ENCOUNTER — Ambulatory Visit (HOSPITAL_COMMUNITY)
Admission: RE | Admit: 2015-07-30 | Discharge: 2015-07-30 | Disposition: A | Discharge status: Home / Self Care | Payer: Medicare Other | Source: Ambulatory Visit | Attending: Hematology and Oncology | Admitting: Hematology and Oncology

## 2015-07-30 ENCOUNTER — Encounter (HOSPITAL_COMMUNITY): Payer: Self-pay

## 2015-07-30 ENCOUNTER — Other Ambulatory Visit (HOSPITAL_BASED_OUTPATIENT_CLINIC_OR_DEPARTMENT_OTHER): Payer: Medicare Other

## 2015-07-30 DIAGNOSIS — I700 Atherosclerosis of aorta: Secondary | ICD-10-CM | POA: Insufficient documentation

## 2015-07-30 DIAGNOSIS — C8510 Unspecified B-cell lymphoma, unspecified site: Secondary | ICD-10-CM | POA: Diagnosis not present

## 2015-07-30 DIAGNOSIS — C8334 Diffuse large B-cell lymphoma, lymph nodes of axilla and upper limb: Secondary | ICD-10-CM | POA: Diagnosis not present

## 2015-07-30 DIAGNOSIS — K760 Fatty (change of) liver, not elsewhere classified: Secondary | ICD-10-CM | POA: Insufficient documentation

## 2015-07-30 LAB — CBC WITH DIFFERENTIAL/PLATELET
BASO%: 0.8 % (ref 0.0–2.0)
Basophils Absolute: 0 10*3/uL (ref 0.0–0.1)
EOS ABS: 0.1 10*3/uL (ref 0.0–0.5)
EOS%: 3 % (ref 0.0–7.0)
HCT: 37.7 % (ref 34.8–46.6)
HEMOGLOBIN: 12.2 g/dL (ref 11.6–15.9)
LYMPH%: 29.7 % (ref 14.0–49.7)
MCH: 25.7 pg (ref 25.1–34.0)
MCHC: 32.5 g/dL (ref 31.5–36.0)
MCV: 79.2 fL — ABNORMAL LOW (ref 79.5–101.0)
MONO#: 0.4 10*3/uL (ref 0.1–0.9)
MONO%: 7.9 % (ref 0.0–14.0)
NEUT%: 58.6 % (ref 38.4–76.8)
NEUTROS ABS: 2.9 10*3/uL (ref 1.5–6.5)
Platelets: 188 10*3/uL (ref 145–400)
RBC: 4.76 10*6/uL (ref 3.70–5.45)
RDW: 15.3 % — AB (ref 11.2–14.5)
WBC: 4.9 10*3/uL (ref 3.9–10.3)
lymph#: 1.5 10*3/uL (ref 0.9–3.3)

## 2015-07-30 LAB — LACTATE DEHYDROGENASE: LDH: 160 U/L (ref 125–245)

## 2015-07-30 LAB — COMPREHENSIVE METABOLIC PANEL
ALT: 16 U/L (ref 0–55)
AST: 19 U/L (ref 5–34)
Albumin: 3.3 g/dL — ABNORMAL LOW (ref 3.5–5.0)
Alkaline Phosphatase: 98 U/L (ref 40–150)
Anion Gap: 8 mEq/L (ref 3–11)
BILIRUBIN TOTAL: 0.31 mg/dL (ref 0.20–1.20)
BUN: 7.9 mg/dL (ref 7.0–26.0)
CHLORIDE: 104 meq/L (ref 98–109)
CO2: 26 meq/L (ref 22–29)
CREATININE: 0.8 mg/dL (ref 0.6–1.1)
Calcium: 9.3 mg/dL (ref 8.4–10.4)
EGFR: 86 mL/min/{1.73_m2} — ABNORMAL LOW (ref >90)
GLUCOSE: 90 mg/dL (ref 70–140)
Potassium: 4.2 mEq/L (ref 3.5–5.1)
SODIUM: 139 meq/L (ref 136–145)
TOTAL PROTEIN: 8 g/dL (ref 6.4–8.3)

## 2015-07-30 MED ORDER — IOPAMIDOL (ISOVUE-300) INJECTION 61%
100.0000 mL | Freq: Once | INTRAVENOUS | Status: AC | PRN
  Administered 2015-07-30: 100 mL via INTRAVENOUS

## 2015-08-06 ENCOUNTER — Encounter: Payer: Self-pay | Admitting: Hematology and Oncology

## 2015-08-06 ENCOUNTER — Telehealth: Payer: Self-pay | Admitting: Hematology and Oncology

## 2015-08-06 ENCOUNTER — Other Ambulatory Visit: Payer: Medicare Other

## 2015-08-06 ENCOUNTER — Ambulatory Visit (HOSPITAL_BASED_OUTPATIENT_CLINIC_OR_DEPARTMENT_OTHER): Payer: Medicare Other | Admitting: Hematology and Oncology

## 2015-08-06 DIAGNOSIS — C8334 Diffuse large B-cell lymphoma, lymph nodes of axilla and upper limb: Secondary | ICD-10-CM

## 2015-08-06 DIAGNOSIS — C8510 Unspecified B-cell lymphoma, unspecified site: Secondary | ICD-10-CM

## 2015-08-06 NOTE — Telephone Encounter (Signed)
appt made and avs printed °

## 2015-08-06 NOTE — Assessment & Plan Note (Signed)
Aggressive large B-cell lymphoma: CD5 and 10 negative, CD20 positive BCL 2 and BCL 6 positive: Differential diagnosis aggressive large B-cell lymphoma versus B-cell lymphoma unclassified, Ki-67 100%, bulky lymphadenopathy supraclavicular and axillary lymph nodes, bone marrow negative, stage IIB   Treatment Summary: R CHOP chemotherapy started 01/16/2014 completed 05/01/14  PET-CT 05/29/14: Near CR  CT chest abdomen pelvis 07/30/2015: Solitary mildly enlarged left axillary lymph nodes slightly decreased in size, no lymphadenopathy in chest or abdomen, normal spleen, diffuse fatty liver and post inflammation 3 scarring right middle lobe lung; No evidence of lymphoma  RML Consolidation: Follows with Dr. Melvyn Novas with pulmonary , CT scan of the chest reveals improvement in airspace disease Chronic hepatitis C: Liver function tests are adequate,  CT abdomen suggests mild cirrhosis. Patient sees gastroenterology.  Return to clinic in 6 months for follow-up and CT scans will be done in one year.

## 2015-08-06 NOTE — Progress Notes (Signed)
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 PET activity in the bone marrow and spleen thought to be related to chemotherapy rather than disease. (Stage 2B)     05/29/2014 PET scan    Significant improvement, with reduction in size and activity of the left supraclavicular and left axillary lymph node ; reduced activity in the normal size spleen; and resolution of the diffuse hypermetabolic marrow activity.     02/02/2015 Imaging    Borderline left axillary lymph node same size, no abdominal or pelvic lymphadenopathy, hepatomegaly mild cirrhosis , improving right middle lobe airspace disease. No evidence of lymphoma      CHIEF COMPLIANT: Lymphoma follow-up  INTERVAL HISTORY: Rachael West is a 60 year old with above-mentioned history of large B-cell lymphoma involving multiple lymph nodes received 6 cycles of R CHOP and had a complete response. She is here for a month follow-up after undergoing CT scans. The scans do not show any evidence of lymphoma or residual disease. She does not have any symptoms of relapse. She denies any fevers chills night sweats or weight loss. Her energy levels have improved significantly.  REVIEW OF SYSTEMS:   Constitutional: Denies fevers, chills or abnormal weight loss Eyes: Denies blurriness of vision Ears,  nose, mouth, throat, and face: Denies mucositis or sore throat Respiratory: Denies cough, dyspnea or wheezes Cardiovascular: Denies palpitation, chest discomfort Gastrointestinal:  Denies nausea, heartburn or change in bowel habits Skin: Denies abnormal skin rashes Lymphatics: Denies new lymphadenopathy or easy bruising Neurological:Denies numbness, tingling or new weaknesses Behavioral/Psych: Mood is stable, no new changes  Extremities: No lower extremity edema All other systems were reviewed with the patient and are negative.  I have reviewed the past medical history, past surgical history, social history and family history with the patient and they are unchanged from previous note.  ALLERGIES:  is allergic to latex; penicillins; codeine; cortisone; and sulfa antibiotics.  MEDICATIONS:  Current Outpatient Prescriptions  Medication Sig Dispense Refill  . ALPRAZolam (XANAX) 0.25 MG tablet Take 0.25 mg by mouth 2 (two) times daily as needed.   1  . AMITIZA 8 MCG capsule 1 CAPSULE BY MOUTH TWICE A DAY  3  . amLODipine (NORVASC) 5 MG tablet Take 5 mg by mouth every morning.     . bimatoprost (LUMIGAN) 0.01 % SOLN Place 1 drop into both eyes at bedtime.    . hydrOXYzine (VISTARIL) 50 MG capsule Take 50 mg by mouth at bedtime.    . meloxicam (MOBIC) 15 MG tablet Take 15 mg by mouth daily.      . nebivolol (BYSTOLIC) 5 MG tablet Take 1 tablet (5 mg total) by mouth daily.  0  . omeprazole (PRILOSEC) 40 MG capsule 1 CAPSULE BY MOUTH DAILY  3  . paliperidone (INVEGA) 6 MG 24 hr tablet Take 6 mg by mouth every morning.    Marland Kitchen  prazosin (MINIPRESS) 1 MG capsule Take 1 mg by mouth at bedtime.    Marland Kitchen PROAIR HFA 108 (90 Base) MCG/ACT inhaler 1-2 PUFFS INHALE EVERY 4-6 HOURS AS NEEDED (PRN)  5  . prochlorperazine (COMPAZINE) 10 MG tablet Take 1 tablet (10 mg total) by mouth every 6 (six) hours as needed (Nausea or vomiting). (Patient not taking: Reported on 03/09/2015) 30 tablet 6  . sertraline (ZOLOFT) 100  MG tablet Take 200 mg by mouth every morning.    . traMADol (ULTRAM) 50 MG tablet Take 50 mg by mouth 3 (three) times daily.    . traZODone (DESYREL) 50 MG tablet 1 TABLET BY MOUTH AT BEDTIME  5   No current facility-administered medications for this visit.     PHYSICAL EXAMINATION: ECOG PERFORMANCE STATUS: 1 - Symptomatic but completely ambulatory  Vitals:   08/06/15 0917  BP: 134/70  Pulse: (!) 105  Resp: 18  Temp: 98.4 F (36.9 C)   Filed Weights   08/06/15 0917  Weight: 206 lb 3.2 oz (93.5 kg)    GENERAL:alert, no distress and comfortable SKIN: skin color, texture, turgor are normal, no rashes or significant lesions EYES: normal, Conjunctiva are pink and non-injected, sclera clear OROPHARYNX:no exudate, no erythema and lips, buccal mucosa, and tongue normal  NECK: supple, thyroid normal size, non-tender, without nodularity LYMPH:  no palpable lymphadenopathy in the cervical, axillary or inguinal LUNGS: clear to auscultation and percussion with normal breathing effort HEART: regular rate & rhythm and no murmurs and no lower extremity edema ABDOMEN:abdomen soft, non-tender and normal bowel sounds MUSCULOSKELETAL:no cyanosis of digits and no clubbing  NEURO: alert & oriented x 3 with fluent speech, no focal motor/sensory deficits EXTREMITIES: No lower extremity edema  LABORATORY DATA:  I have reviewed the data as listed   Chemistry      Component Value Date/Time   NA 139 07/30/2015 0925   K 4.2 07/30/2015 0925   CL 106 11/11/2014 1250   CO2 26 07/30/2015 0925   BUN 7.9 07/30/2015 0925   CREATININE 0.8 07/30/2015 0925      Component Value Date/Time   CALCIUM 9.3 07/30/2015 0925   ALKPHOS 98 07/30/2015 0925   AST 19 07/30/2015 0925   ALT 16 07/30/2015 0925   BILITOT 0.31 07/30/2015 0925       Lab Results  Component Value Date   WBC 4.9 07/30/2015   HGB 12.2 07/30/2015   HCT 37.7 07/30/2015   MCV 79.2 (L) 07/30/2015   PLT 188 07/30/2015   NEUTROABS 2.9  07/30/2015     ASSESSMENT & PLAN:  Large B-cell lymphoma Aggressive large B-cell lymphoma: CD5 and 10 negative, CD20 positive BCL 2 and BCL 6 positive: Differential diagnosis aggressive large B-cell lymphoma versus B-cell lymphoma unclassified, Ki-67 100%, bulky lymphadenopathy supraclavicular and axillary lymph nodes, bone marrow negative, stage IIB   Treatment Summary: R CHOP chemotherapy started 01/16/2014 completed 05/01/14  PET-CT 05/29/14: Near CR  CT chest abdomen pelvis 07/30/2015: Solitary mildly enlarged left axillary lymph nodes slightly decreased in size, no lymphadenopathy in chest or abdomen, normal spleen, diffuse fatty liver and post inflammation 3 scarring right middle lobe lung; No evidence of lymphoma  RML Consolidation: Follows with Dr. Melvyn Novas with pulmonary , CT scan of the chest reveals improvement in airspace disease Chronic hepatitis C: Liver function tests are adequate,  CT abdomen suggests mild cirrhosis. Patient sees gastroenterology.  Return to clinic in 6 months for follow-up and CT scans will be done in one year.  Orders Placed This Encounter  Procedures  . CBC with Differential    Standing Status:   Future    Standing Expiration Date:   08/05/2016  . Comprehensive metabolic panel    Standing Status:   Future    Standing Expiration Date:   08/05/2016  . Lactate dehydrogenase (LDH)    Standing Status:   Future    Standing Expiration Date:   08/05/2016   The patient has a good understanding of the overall plan. she agrees with it. she will call with any problems that may develop before the next visit here.   Rulon Eisenmenger, MD 08/06/15

## 2015-10-20 IMAGING — CT NM PET TUM IMG RESTAG (PS) SKULL BASE T - THIGH
7 series · 25 of 25 positions shown · non-contrast
Comparison: Multiple exams, including 01/21/2014

CLINICAL DATA: Subsequent treatment strategy for Large B-cell
lymphoma.

EXAM:
NUCLEAR MEDICINE PET SKULL BASE TO THIGH
TECHNIQUE: 10.9 mCi F-18 FDG was injected intravenously. Full-ring PET imaging
was performed from the skull base to thigh after the radiotracer. CT
data was obtained and used for attenuation correction and anatomic
localization.
FASTING BLOOD GLUCOSE:  Value: 104 mg/dl

[Series 3: pet sk_thigh ac · axial · 5.0mm · 4.07mm/px · z∈[-903,-59]mm · 5 of 212 slices shown]
[im 1/212]
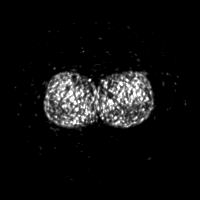
[im 53/212]
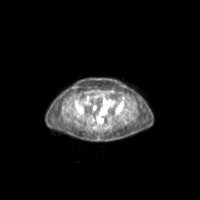
[im 106/212]
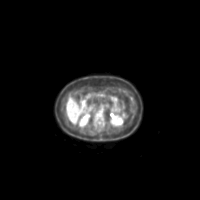
[im 159/212]
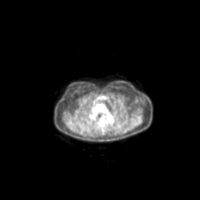
[im 212/212]
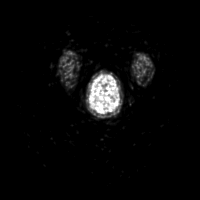

[Series 4: ct sk_thigh 5.0 hd_fov · axial · 5.0mm · 1.07mm/px · z∈[-903,-59]mm · 5 of 212 slices shown]
[im 1/212]
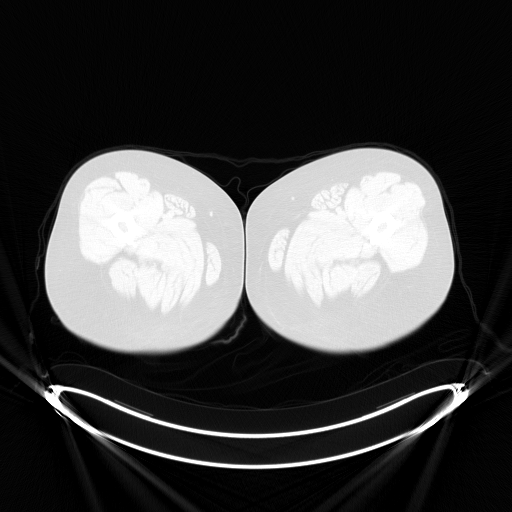
[im 53/212]
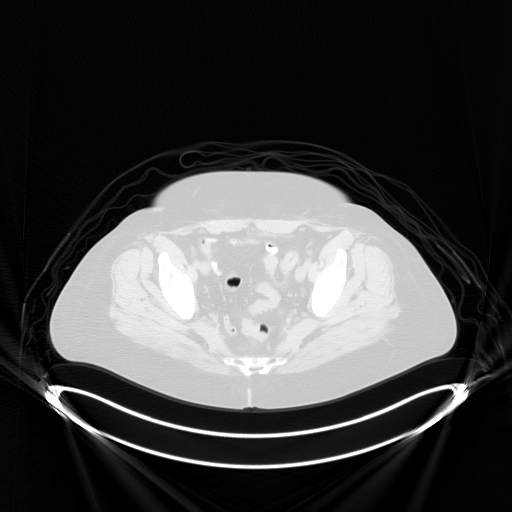
[im 106/212]
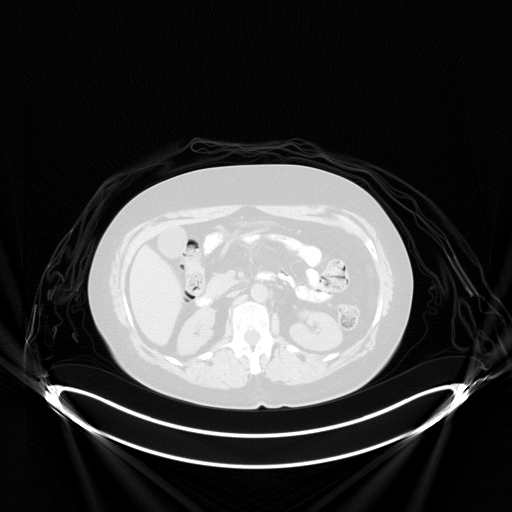
[im 159/212]
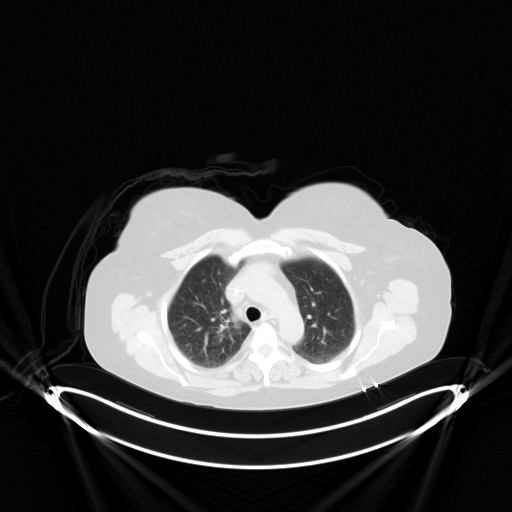
[im 212/212  brain]
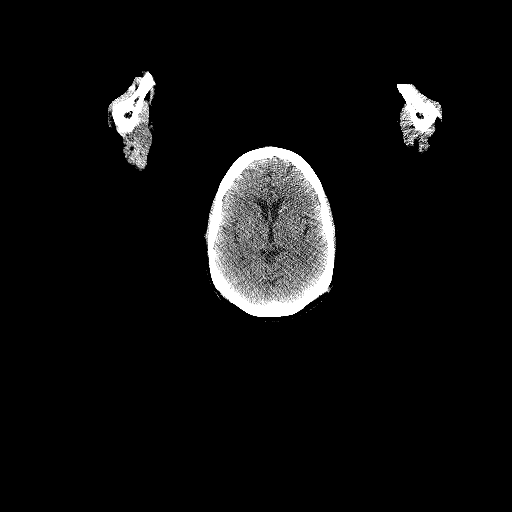

[Series 6: ct sk_thigh 5.0 b70f lung_bone · axial · 5.0mm · 0.65mm/px · z∈[-452,-184]mm · 2 of 68 slices shown]
[im 1/68  bone]
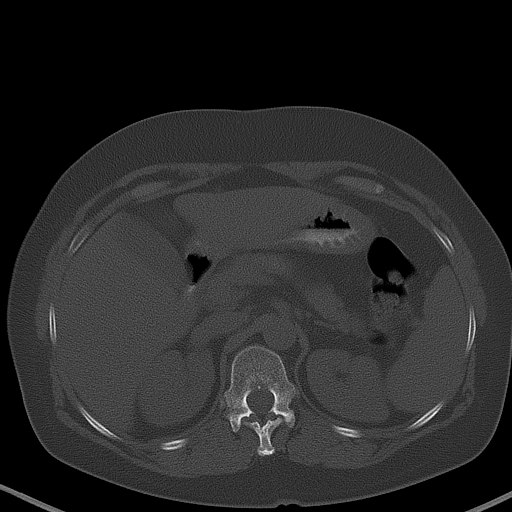
[im 68/68  bone]
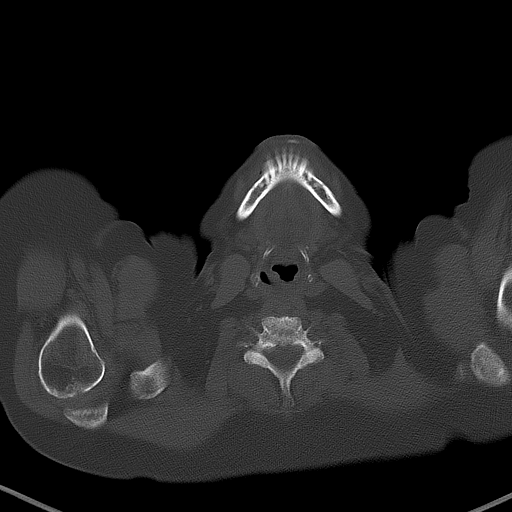

[Series 8: pet sk_thigh nac · axial · 5.0mm · 4.07mm/px · z∈[-903,-59]mm · 5 of 212 slices shown]
[im 1/212]
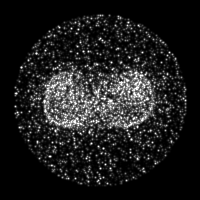
[im 53/212]
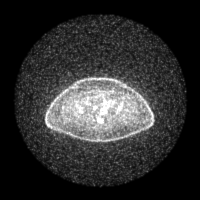
[im 106/212]
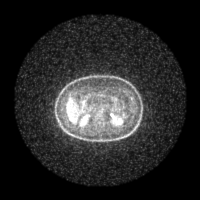
[im 159/212]
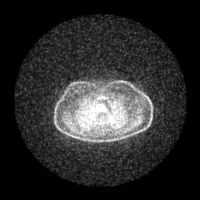
[im 212/212]
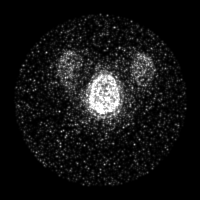

[Series 604: mip collection<mip range> · coronal · 1.75mm/px · 1 of 32 slices shown]
[im 1/32]
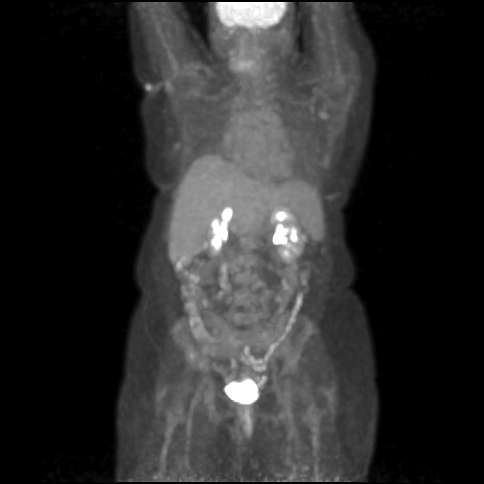

[Series 605: range-ct sk_thigh 5.0 hd_fov-cor-<alpha range> · 2 of 83 slices shown]
[im 1/83]
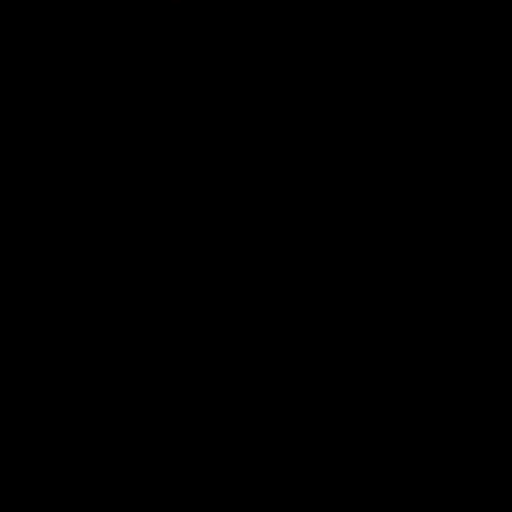
[im 83/83]
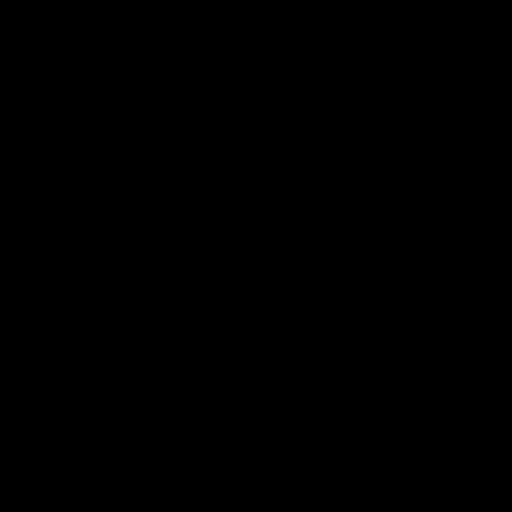

[Series 606: range-ct sk_thigh 5.0 hd_fov-tra-<alpha range> · 5 of 197 slices shown]
[im 1/197]
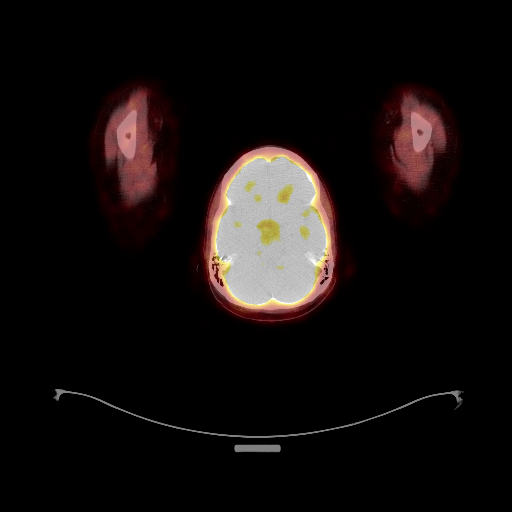
[im 50/197]
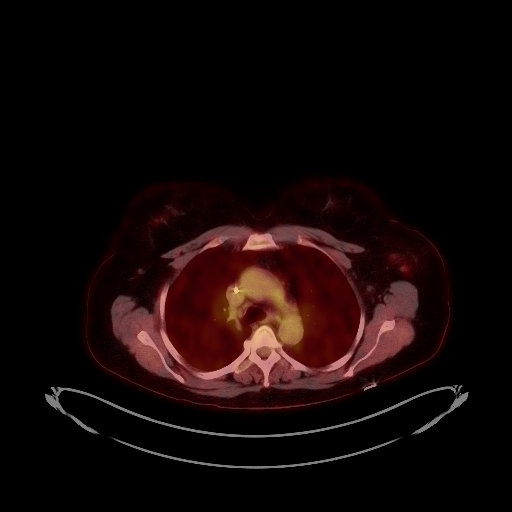
[im 99/197]
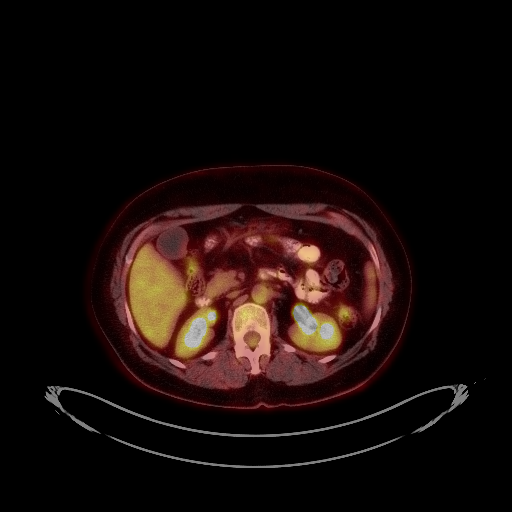
[im 148/197]
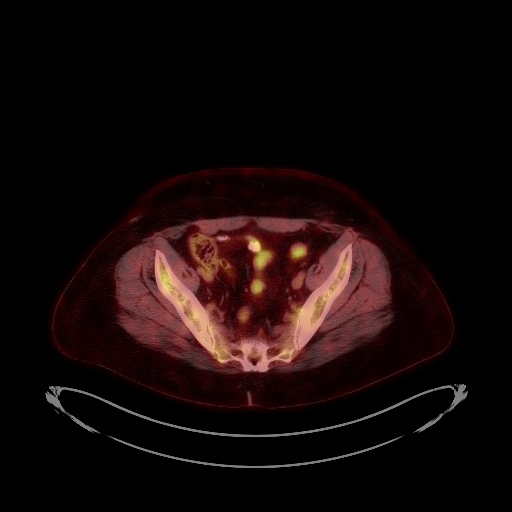
[im 197/197]
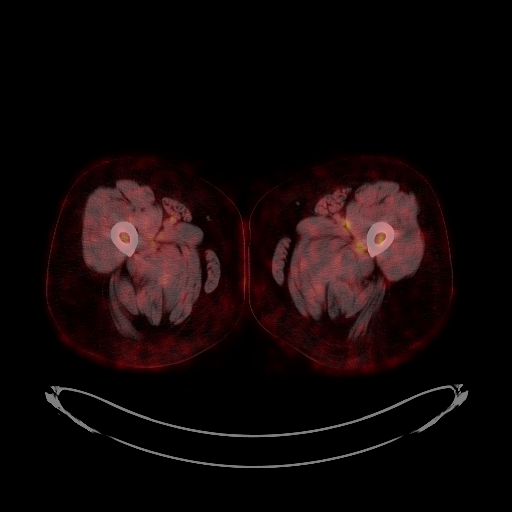

[25 of 25 positions shown; findings below may reference images not displayed]

FINDINGS: NECK

Reduced size and activity of the prior left supraclavicular lymph
nodes, index lymph node measuring 1.0 cm in short axis on image 40
series 4 with maximum standard uptake value 1.9, formerly 4.7.
Currently [HOSPITAL] 2.

CHEST

Reduced size in activity left axillary lymph nodes, with the largest
remaining lymph node measuring 1.4 cm in short axis on image 52 of
series 4 and with maximum standard uptake value of 3.0 (formerly
11.3). Currently [HOSPITAL] 2.

ABDOMEN/PELVIS

No abnormal hypermetabolic activity within the liver, pancreas,
adrenal glands, or spleen. No hypermetabolic lymph nodes in the
abdomen or pelvis. No current splenomegaly or hypermetabolic splenic
activity.

SKELETON

No focal hypermetabolic activity to suggest skeletal metastasis.
Prior diffuse marrow hypermetabolic activity has resolved.
IMPRESSION: 1. Significant improvement, with reduction in size and activity of
the left supraclavicular and left axillary lymph node ; reduced
activity in the normal size spleen; and resolution of the diffuse
hypermetabolic marrow activity. The adenopathy is currently
[HOSPITAL] 2.

## 2016-01-14 ENCOUNTER — Other Ambulatory Visit: Payer: Self-pay | Admitting: Nurse Practitioner

## 2016-02-03 NOTE — Assessment & Plan Note (Signed)
Aggressive large B-cell lymphoma: CD5 and 10 negative, CD20 positive BCL 2 and BCL 6 positive: Differential diagnosis aggressive large B-cell lymphoma versus B-cell lymphoma unclassified, Ki-67 100%, bulky lymphadenopathy supraclavicular and axillary lymph nodes, bone marrow negative, stage IIB   Treatment Summary: R CHOP chemotherapy started 01/16/2014 completed 05/01/14  PET-CT 05/29/14: Near CR  CT chest abdomen pelvis 07/30/2015: Solitary mildly enlarged left axillary lymph nodes slightly decreased in size, no lymphadenopathy in chest or abdomen, normal spleen, diffuse fatty liver and post inflammation 3 scarring right middle lobe lung; No evidence of lymphoma  RML Consolidation:Follows with Dr. Melvyn Novas with pulmonary , CT scan of the chest reveals improvement in airspace disease Chronic hepatitis C:Liver function tests are adequate, CT abdomen suggests mild cirrhosis. Patient sees gastroenterology.  Return to clinic in 6 months for follow-up with CT scans

## 2016-02-04 ENCOUNTER — Encounter: Payer: Self-pay | Admitting: Hematology and Oncology

## 2016-02-04 ENCOUNTER — Ambulatory Visit (HOSPITAL_BASED_OUTPATIENT_CLINIC_OR_DEPARTMENT_OTHER): Payer: Medicare Other | Admitting: Hematology and Oncology

## 2016-02-04 ENCOUNTER — Other Ambulatory Visit (HOSPITAL_BASED_OUTPATIENT_CLINIC_OR_DEPARTMENT_OTHER): Payer: Medicare Other

## 2016-02-04 DIAGNOSIS — C8334 Diffuse large B-cell lymphoma, lymph nodes of axilla and upper limb: Secondary | ICD-10-CM

## 2016-02-04 DIAGNOSIS — B1920 Unspecified viral hepatitis C without hepatic coma: Secondary | ICD-10-CM | POA: Diagnosis not present

## 2016-02-04 DIAGNOSIS — C8510 Unspecified B-cell lymphoma, unspecified site: Secondary | ICD-10-CM

## 2016-02-04 LAB — COMPREHENSIVE METABOLIC PANEL
ALT: 10 U/L (ref 0–55)
ANION GAP: 8 meq/L (ref 3–11)
AST: 15 U/L (ref 5–34)
Albumin: 3.6 g/dL (ref 3.5–5.0)
Alkaline Phosphatase: 123 U/L (ref 40–150)
BUN: 10.3 mg/dL (ref 7.0–26.0)
CALCIUM: 9.7 mg/dL (ref 8.4–10.4)
CHLORIDE: 106 meq/L (ref 98–109)
CO2: 26 mEq/L (ref 22–29)
CREATININE: 0.9 mg/dL (ref 0.6–1.1)
EGFR: 82 mL/min/{1.73_m2} — ABNORMAL LOW (ref >90)
Glucose: 97 mg/dl (ref 70–140)
POTASSIUM: 4.3 meq/L (ref 3.5–5.1)
Sodium: 140 mEq/L (ref 136–145)
Total Bilirubin: 0.55 mg/dL (ref 0.20–1.20)
Total Protein: 8.3 g/dL (ref 6.4–8.3)

## 2016-02-04 LAB — CBC WITH DIFFERENTIAL/PLATELET
BASO%: 0.9 % (ref 0.0–2.0)
BASOS ABS: 0 10*3/uL (ref 0.0–0.1)
EOS%: 2.2 % (ref 0.0–7.0)
Eosinophils Absolute: 0.1 10*3/uL (ref 0.0–0.5)
HEMATOCRIT: 40.1 % (ref 34.8–46.6)
HGB: 13.1 g/dL (ref 11.6–15.9)
LYMPH#: 1.9 10*3/uL (ref 0.9–3.3)
LYMPH%: 34.2 % (ref 14.0–49.7)
MCH: 26.1 pg (ref 25.1–34.0)
MCHC: 32.7 g/dL (ref 31.5–36.0)
MCV: 80 fL (ref 79.5–101.0)
MONO#: 0.4 10*3/uL (ref 0.1–0.9)
MONO%: 6.9 % (ref 0.0–14.0)
NEUT#: 3.1 10*3/uL (ref 1.5–6.5)
NEUT%: 55.8 % (ref 38.4–76.8)
PLATELETS: 239 10*3/uL (ref 145–400)
RBC: 5 10*6/uL (ref 3.70–5.45)
RDW: 14.6 % — ABNORMAL HIGH (ref 11.2–14.5)
WBC: 5.5 10*3/uL (ref 3.9–10.3)

## 2016-02-04 LAB — LACTATE DEHYDROGENASE: LDH: 142 U/L (ref 125–245)

## 2016-02-04 MED ORDER — BUSPIRONE HCL 5 MG PO TABS: 5.0000 mg | ORAL_TABLET | Freq: Two times a day (BID) | ORAL | Status: DC

## 2016-02-04 MED ORDER — LINACLOTIDE 72 MCG PO CAPS: 72.0000 ug | ORAL_CAPSULE | Freq: Every day | ORAL | Status: DC

## 2016-02-04 NOTE — Progress Notes (Signed)
Patient Care Team: Pcp Not In System as PCP - General Laurence Spates, MD as Consulting Physician (Gastroenterology)  DIAGNOSIS:  Encounter Diagnosis  Name Primary?  . 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 PET activity in the bone marrow and spleen thought to be related to chemotherapy rather than disease. (Stage 2B)      05/29/2014 PET scan    Significant improvement, with reduction in size and activity of the left supraclavicular and left axillary lymph node ; reduced activity in the normal size spleen; and resolution of the diffuse hypermetabolic marrow activity.      02/02/2015 Imaging    Borderline left axillary lymph node same size, no abdominal or pelvic lymphadenopathy, hepatomegaly mild cirrhosis , improving right middle lobe airspace disease. No evidence of lymphoma       CHIEF COMPLIANT: Follow-up of large cell lymphoma  INTERVAL HISTORY: Rachael West is a 61 year old with above-mentioned history of large B-cell lymphoma who received R CHOP for 6 cycles and is currently on surveillance. She denies any lumps or nodules. She denies any hot flashes night sweats or weight loss. She has been off her cold. Denies any fevers or chills today.   REVIEW OF SYSTEMS:   Constitutional: Denies fevers, chills or abnormal weight loss Eyes: Denies blurriness of vision Ears, nose, mouth, throat, and face: Denies mucositis or sore throat Respiratory: Denies  cough, dyspnea or wheezes Cardiovascular: Denies palpitation, chest discomfort Gastrointestinal:  Denies nausea, heartburn or change in bowel habits Skin: Denies abnormal skin rashes Lymphatics: Denies new lymphadenopathy or easy bruising Neurological:Denies numbness, tingling or new weaknesses Behavioral/Psych: Mood is stable, no new changes  Extremities: No lower extremity edema  All other systems were reviewed with the patient and are negative.  I have reviewed the past medical history, past surgical history, social history and family history with the patient and they are unchanged from previous note.  ALLERGIES:  is allergic to latex; penicillins; codeine; cortisone; and sulfa antibiotics.  MEDICATIONS:  Current Outpatient Prescriptions  Medication Sig Dispense Refill  . ALPRAZolam (XANAX) 0.25 MG tablet Take 0.25 mg by mouth 2 (two) times daily as needed.   1  . AMITIZA 8 MCG capsule 1 CAPSULE BY MOUTH TWICE A DAY  3  . amLODipine (NORVASC) 5 MG tablet Take 5 mg by mouth every morning.     . bimatoprost (LUMIGAN) 0.01 % SOLN Place 1 drop into both eyes at bedtime.    . busPIRone (BUSPAR) 5 MG tablet Take 1 tablet (5 mg total) by mouth 2 (two) times daily.    . hydrOXYzine (VISTARIL) 50 MG capsule Take 50 mg by mouth at bedtime.    Marland Kitchen linaclotide (LINZESS) 72 MCG capsule Take 1 capsule (72 mcg total) by mouth daily before breakfast. 30 capsule   . meloxicam (MOBIC) 15 MG tablet Take 15 mg by mouth daily.      . nebivolol (BYSTOLIC) 5 MG tablet  Take 1 tablet (5 mg total) by mouth daily.  0  . omeprazole (PRILOSEC) 40 MG capsule 1 CAPSULE BY MOUTH DAILY  3  . paliperidone (INVEGA) 6 MG 24 hr tablet Take 6 mg by mouth every morning.    . prazosin (MINIPRESS) 1 MG capsule Take 1 mg by mouth at bedtime.    Marland Kitchen PROAIR HFA 108 (90 Base) MCG/ACT inhaler 1-2 PUFFS INHALE EVERY 4-6 HOURS AS NEEDED (PRN)  5  . sertraline (ZOLOFT) 100 MG tablet Take 200 mg by mouth every morning.    . traMADol  (ULTRAM) 50 MG tablet Take 50 mg by mouth 3 (three) times daily.    . traZODone (DESYREL) 50 MG tablet 1 TABLET BY MOUTH AT BEDTIME  5   No current facility-administered medications for this visit.     PHYSICAL EXAMINATION: ECOG PERFORMANCE STATUS: 1 - Symptomatic but completely ambulatory  Vitals:   02/04/16 1025  BP: 124/74  Pulse: 97  Resp: 19  Temp: 98.1 F (36.7 C)   Filed Weights   02/04/16 1025  Weight: 193 lb 12.8 oz (87.9 kg)    GENERAL:alert, no distress and comfortable SKIN: skin color, texture, turgor are normal, no rashes or significant lesions EYES: normal, Conjunctiva are pink and non-injected, sclera clear OROPHARYNX:no exudate, no erythema and lips, buccal mucosa, and tongue normal  NECK: supple, thyroid normal size, non-tender, without nodularity LYMPH:  no palpable lymphadenopathy in the cervical, axillary or inguinal LUNGS: clear to auscultation and percussion with normal breathing effort HEART: regular rate & rhythm and no murmurs and no lower extremity edema ABDOMEN:abdomen soft, non-tender and normal bowel sounds MUSCULOSKELETAL:no cyanosis of digits and no clubbing  NEURO: alert & oriented x 3 with fluent speech, no focal motor/sensory deficits EXTREMITIES: No lower extremity edema  LABORATORY DATA:  I have reviewed the data as listed   Chemistry      Component Value Date/Time   NA 139 07/30/2015 0925   K 4.2 07/30/2015 0925   CL 106 11/11/2014 1250   CO2 26 07/30/2015 0925   BUN 7.9 07/30/2015 0925   CREATININE 0.8 07/30/2015 0925      Component Value Date/Time   CALCIUM 9.3 07/30/2015 0925   ALKPHOS 98 07/30/2015 0925   AST 19 07/30/2015 0925   ALT 16 07/30/2015 0925   BILITOT 0.31 07/30/2015 0925       Lab Results  Component Value Date   WBC 5.5 02/04/2016   HGB 13.1 02/04/2016   HCT 40.1 02/04/2016   MCV 80.0 02/04/2016   PLT 239 02/04/2016   NEUTROABS 3.1 02/04/2016    ASSESSMENT & PLAN:  Large B-cell  lymphoma Aggressive large B-cell lymphoma: CD5 and 10 negative, CD20 positive BCL 2 and BCL 6 positive: Differential diagnosis aggressive large B-cell lymphoma versus B-cell lymphoma unclassified, Ki-67 100%, bulky lymphadenopathy supraclavicular and axillary lymph nodes, bone marrow negative, stage IIB   Treatment Summary: R CHOP chemotherapy started 01/16/2014 completed 05/01/14  PET-CT 05/29/14: Near CR  CT chest abdomen pelvis 07/30/2015: Solitary mildly enlarged left axillary lymph nodes slightly decreased in size, no lymphadenopathy in chest or abdomen, normal spleen, diffuse fatty liver and post inflammation 3 scarring right middle lobe lung; No evidence of lymphoma  RML Consolidation:Follows with Dr. Melvyn Novas with pulmonary , CT scan of the chest reveals improvement in airspace disease Chronic hepatitis C:Liver function tests are adequate, CT abdomen suggests mild cirrhosis. Patient sees gastroenterology.  Return to clinic in 6 months for follow-up with CT scans  I spent 25 minutes talking to the patient of which more than half was spent in counseling and coordination of care.  Orders Placed This Encounter  Procedures  . CT Abdomen Pelvis W Contrast    Standing Status:   Future    Standing Expiration Date:   02/03/2017    Order Specific Question:   If indicated for the ordered procedure, I authorize the administration of contrast media per Radiology protocol    Answer:   Yes    Order Specific Question:   Reason for Exam (SYMPTOM  OR DIAGNOSIS REQUIRED)    Answer:   Diffuse large B cell lymphoma restaging    Order Specific Question:   Preferred imaging location?    Answer:   [facility]  . CT Chest W Contrast    Standing Status:   Future    Standing Expiration Date:   02/03/2017    Order Specific Question:   If indicated for the ordered procedure, I authorize the administration of contrast media per Radiology protocol    Answer:   Yes    Order Specific Question:    Reason for Exam (SYMPTOM  OR DIAGNOSIS REQUIRED)    Answer:   Diffuse large B cell lymphoma restaging    Order Specific Question:   Preferred imaging location?    Answer:   [facility]  . CBC with Differential    Standing Status:   Future    Standing Expiration Date:   02/03/2017  . Comprehensive metabolic panel    Standing Status:   Future    Standing Expiration Date:   02/03/2017  . Lactate dehydrogenase (LDH)    Standing Status:   Future    Standing Expiration Date:   02/03/2017   The patient has a good understanding of the overall plan. she agrees with it. she will call with any problems that may develop before the next visit here.   Rulon Eisenmenger, MD 02/04/16

## 2016-03-24 ENCOUNTER — Other Ambulatory Visit: Payer: Self-pay | Admitting: Emergency Medicine

## 2016-04-27 ENCOUNTER — Ambulatory Visit (HOSPITAL_COMMUNITY): Admission: RE | Admit: 2016-04-27 | Payer: Medicare Other | Source: Ambulatory Visit

## 2016-07-28 ENCOUNTER — Other Ambulatory Visit: Payer: Self-pay

## 2016-07-28 DIAGNOSIS — C8510 Unspecified B-cell lymphoma, unspecified site: Secondary | ICD-10-CM

## 2016-07-31 ENCOUNTER — Other Ambulatory Visit (HOSPITAL_BASED_OUTPATIENT_CLINIC_OR_DEPARTMENT_OTHER): Payer: Medicare Other

## 2016-07-31 ENCOUNTER — Ambulatory Visit (HOSPITAL_COMMUNITY)
Admission: RE | Admit: 2016-07-31 | Discharge: 2016-07-31 | Disposition: A | Discharge status: Home / Self Care | Payer: Medicare Other | Source: Ambulatory Visit | Attending: Hematology and Oncology | Admitting: Hematology and Oncology

## 2016-07-31 ENCOUNTER — Encounter (HOSPITAL_COMMUNITY): Payer: Self-pay

## 2016-07-31 DIAGNOSIS — C8334 Diffuse large B-cell lymphoma, lymph nodes of axilla and upper limb: Secondary | ICD-10-CM | POA: Diagnosis not present

## 2016-07-31 DIAGNOSIS — C8510 Unspecified B-cell lymphoma, unspecified site: Secondary | ICD-10-CM

## 2016-07-31 LAB — CBC WITH DIFFERENTIAL/PLATELET
BASO%: 0.8 % (ref 0.0–2.0)
Basophils Absolute: 0 10*3/uL (ref 0.0–0.1)
EOS%: 1.9 % (ref 0.0–7.0)
Eosinophils Absolute: 0.1 10*3/uL (ref 0.0–0.5)
HEMATOCRIT: 39.4 % (ref 34.8–46.6)
HGB: 12.8 g/dL (ref 11.6–15.9)
LYMPH#: 2.4 10*3/uL (ref 0.9–3.3)
LYMPH%: 43.1 % (ref 14.0–49.7)
MCH: 25.8 pg (ref 25.1–34.0)
MCHC: 32.4 g/dL (ref 31.5–36.0)
MCV: 79.6 fL (ref 79.5–101.0)
MONO#: 0.4 10*3/uL (ref 0.1–0.9)
MONO%: 6.9 % (ref 0.0–14.0)
NEUT#: 2.6 10*3/uL (ref 1.5–6.5)
NEUT%: 47.3 % (ref 38.4–76.8)
Platelets: 201 10*3/uL (ref 145–400)
RBC: 4.95 10*6/uL (ref 3.70–5.45)
RDW: 13.2 % (ref 11.2–14.5)
WBC: 5.6 10*3/uL (ref 3.9–10.3)

## 2016-07-31 LAB — COMPREHENSIVE METABOLIC PANEL
ALT: 11 U/L (ref 0–55)
AST: 13 U/L (ref 5–34)
Albumin: 3.5 g/dL (ref 3.5–5.0)
Alkaline Phosphatase: 99 U/L (ref 40–150)
Anion Gap: 10 mEq/L (ref 3–11)
BUN: 12.9 mg/dL (ref 7.0–26.0)
CALCIUM: 9.7 mg/dL (ref 8.4–10.4)
CHLORIDE: 103 meq/L (ref 98–109)
CO2: 24 meq/L (ref 22–29)
CREATININE: 0.9 mg/dL (ref 0.6–1.1)
EGFR: 77 mL/min/{1.73_m2} — ABNORMAL LOW (ref >90)
GLUCOSE: 87 mg/dL (ref 70–140)
POTASSIUM: 3.7 meq/L (ref 3.5–5.1)
SODIUM: 138 meq/L (ref 136–145)
Total Bilirubin: 0.49 mg/dL (ref 0.20–1.20)
Total Protein: 8.2 g/dL (ref 6.4–8.3)

## 2016-07-31 LAB — LACTATE DEHYDROGENASE: LDH: 171 U/L (ref 125–245)

## 2016-07-31 MED ORDER — IOPAMIDOL (ISOVUE-300) INJECTION 61%
INTRAVENOUS | Status: AC
  Filled 2016-07-31: qty 100

## 2016-07-31 MED ORDER — IOPAMIDOL (ISOVUE-300) INJECTION 61%
100.0000 mL | Freq: Once | INTRAVENOUS | Status: AC | PRN
  Administered 2016-07-31: 100 mL via INTRAVENOUS

## 2016-08-03 ENCOUNTER — Other Ambulatory Visit (HOSPITAL_BASED_OUTPATIENT_CLINIC_OR_DEPARTMENT_OTHER): Payer: Medicare Other

## 2016-08-03 ENCOUNTER — Ambulatory Visit (HOSPITAL_BASED_OUTPATIENT_CLINIC_OR_DEPARTMENT_OTHER): Payer: Medicare Other | Admitting: Hematology and Oncology

## 2016-08-03 ENCOUNTER — Encounter: Payer: Self-pay | Admitting: Hematology and Oncology

## 2016-08-03 DIAGNOSIS — C8334 Diffuse large B-cell lymphoma, lymph nodes of axilla and upper limb: Secondary | ICD-10-CM

## 2016-08-03 DIAGNOSIS — C8510 Unspecified B-cell lymphoma, unspecified site: Secondary | ICD-10-CM

## 2016-08-03 DIAGNOSIS — B182 Chronic viral hepatitis C: Secondary | ICD-10-CM

## 2016-08-03 LAB — COMPREHENSIVE METABOLIC PANEL
ALBUMIN: 3.4 g/dL — AB (ref 3.5–5.0)
ALK PHOS: 94 U/L (ref 40–150)
ALT: 9 U/L (ref 0–55)
ANION GAP: 6 meq/L (ref 3–11)
AST: 12 U/L (ref 5–34)
BILIRUBIN TOTAL: 0.29 mg/dL (ref 0.20–1.20)
BUN: 11 mg/dL (ref 7.0–26.0)
CO2: 27 mEq/L (ref 22–29)
CREATININE: 0.9 mg/dL (ref 0.6–1.1)
Calcium: 9.4 mg/dL (ref 8.4–10.4)
Chloride: 106 mEq/L (ref 98–109)
EGFR: 80 mL/min/{1.73_m2} — AB (ref >90)
GLUCOSE: 116 mg/dL (ref 70–140)
Potassium: 4.3 mEq/L (ref 3.5–5.1)
SODIUM: 140 meq/L (ref 136–145)
TOTAL PROTEIN: 7.9 g/dL (ref 6.4–8.3)

## 2016-08-03 LAB — CBC WITH DIFFERENTIAL/PLATELET
BASO%: 0.8 % (ref 0.0–2.0)
Basophils Absolute: 0 10*3/uL (ref 0.0–0.1)
EOS%: 1.8 % (ref 0.0–7.0)
Eosinophils Absolute: 0.1 10*3/uL (ref 0.0–0.5)
HCT: 37.3 % (ref 34.8–46.6)
HGB: 12.1 g/dL (ref 11.6–15.9)
LYMPH%: 39.1 % (ref 14.0–49.7)
MCH: 26 pg (ref 25.1–34.0)
MCHC: 32.4 g/dL (ref 31.5–36.0)
MCV: 80.2 fL (ref 79.5–101.0)
MONO#: 0.4 10*3/uL (ref 0.1–0.9)
MONO%: 7.6 % (ref 0.0–14.0)
NEUT%: 50.7 % (ref 38.4–76.8)
NEUTROS ABS: 2.7 10*3/uL (ref 1.5–6.5)
PLATELETS: 208 10*3/uL (ref 145–400)
RBC: 4.65 10*6/uL (ref 3.70–5.45)
RDW: 13.5 % (ref 11.2–14.5)
WBC: 5.2 10*3/uL (ref 3.9–10.3)
lymph#: 2.1 10*3/uL (ref 0.9–3.3)

## 2016-08-03 LAB — LACTATE DEHYDROGENASE: LDH: 126 U/L (ref 125–245)

## 2016-08-03 NOTE — Progress Notes (Signed)
Patient Care Team: Care, Jinny Blossom [facility] as PCP - General (Family Medicine) Laurence Spates, MD as Consulting Physician (Gastroenterology)  DIAGNOSIS:  Encounter Diagnosis  Name Primary?  . 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 PET activity in the bone marrow and spleen thought to be related to chemotherapy rather than disease. (Stage 2B)      05/29/2014 PET scan    Significant improvement, with reduction in size and activity of the left supraclavicular and left axillary lymph node ; reduced activity in the normal size spleen; and resolution of the diffuse hypermetabolic marrow activity.      02/02/2015 Imaging    Borderline left axillary lymph node same size, no abdominal or pelvic lymphadenopathy, hepatomegaly mild cirrhosis , improving right middle lobe airspace disease. No evidence of lymphoma      07/31/2016 Imaging    CT CAP; no findings of lymphoma, index left axillary lymph node 9 mm within normal limits       CHIEF COMPLIANT: Annual follow-up of large B-cell lymphoma  INTERVAL HISTORY: Rachael West is a 61 year old with above-mentioned history of diffuse osseous lymphoma who received R CHOP chemotherapy and had a complete response. She had a recent CT scan and is here today to discuss the report. Overall she feels well. She does have abdominal issues with the irritable bowel syndrome abdominal cramps and tenderness. She denies any  fevers chills night sweats or weight loss.  REVIEW OF SYSTEMS:   Constitutional: Denies fevers, chills or abnormal weight loss Eyes: Denies blurriness of vision Ears, nose, mouth, throat, and face: Denies mucositis or sore throat Respiratory: Denies cough, dyspnea or wheezes Cardiovascular: Denies palpitation, chest discomfort Gastrointestinal: Abdominal pain and diarrhea Skin: Denies abnormal skin rashes Lymphatics: Denies new lymphadenopathy or easy bruising Neurological:Denies numbness, tingling or new weaknesses Behavioral/Psych: Mood is stable, no new changes  Extremities: No lower extremity edema  All other systems were reviewed with the patient and are negative.  I have reviewed the past medical history, past surgical history, social history and family history with the patient and they are unchanged from previous note.  ALLERGIES:  is allergic to latex; penicillins; codeine; cortisone; and sulfa antibiotics.  MEDICATIONS:  Current Outpatient Prescriptions  Medication Sig Dispense Refill  . amLODipine (NORVASC) 5 MG tablet Take 5 mg by mouth every morning.     . bimatoprost (LUMIGAN) 0.01 % SOLN Place 1 drop into both eyes at bedtime.    . busPIRone (BUSPAR) 5 MG tablet Take 1 tablet (5 mg total) by mouth 2 (two) times daily.    . hydrOXYzine (VISTARIL) 50 MG capsule Take 50 mg by mouth at bedtime.    Marland Kitchen linaclotide (LINZESS) 72 MCG capsule Take 1 capsule (72 mcg total) by mouth daily before breakfast. 30 capsule   . meloxicam (MOBIC) 15 MG tablet Take 15 mg by mouth daily.      Marland Kitchen omeprazole (PRILOSEC) 40 MG capsule  1 CAPSULE BY MOUTH DAILY  3  . paliperidone (INVEGA) 6 MG 24 hr tablet Take 6 mg by mouth every morning.    . prazosin (MINIPRESS) 1 MG capsule Take 1 mg by mouth at bedtime.    Marland Kitchen PROAIR HFA 108 (90 Base) MCG/ACT inhaler 1-2 PUFFS INHALE EVERY 4-6 HOURS AS NEEDED (PRN)  5  . sertraline (ZOLOFT) 100 MG tablet Take 200 mg by mouth every morning.    . traMADol  (ULTRAM) 50 MG tablet Take 50 mg by mouth 3 (three) times daily.    . traZODone (DESYREL) 50 MG tablet 1 TABLET BY MOUTH AT BEDTIME  5   No current facility-administered medications for this visit.     PHYSICAL EXAMINATION: ECOG PERFORMANCE STATUS: 1 - Symptomatic but completely ambulatory  Vitals:   08/03/16 0959  BP: 107/66  Pulse: 73  Resp: 20  Temp: 98.3 F (36.8 C)   Filed Weights   08/03/16 0959  Weight: 185 lb 4.8 oz (84.1 kg)    GENERAL:alert, no distress and comfortable SKIN: skin color, texture, turgor are normal, no rashes or significant lesions EYES: normal, Conjunctiva are pink and non-injected, sclera clear OROPHARYNX:no exudate, no erythema and lips, buccal mucosa, and tongue normal  NECK: supple, thyroid normal size, non-tender, without nodularity LYMPH:  no palpable lymphadenopathy in the cervical, axillary or inguinal LUNGS: clear to auscultation and percussion with normal breathing effort HEART: regular rate & rhythm and no murmurs and no lower extremity edema ABDOMEN:abdomen soft, tender to palpation, no hepatosplenomegaly MUSCULOSKELETAL:no cyanosis of digits and no clubbing  NEURO: alert & oriented x 3 with fluent speech, no focal motor/sensory deficits EXTREMITIES: No lower extremity edema  LABORATORY DATA:  I have reviewed the data as listed   Chemistry      Component Value Date/Time   NA 140 08/03/2016 0940   K 4.3 08/03/2016 0940   CL 106 11/11/2014 1250   CO2 27 08/03/2016 0940   BUN 11.0 08/03/2016 0940   CREATININE 0.9 08/03/2016 0940      Component Value Date/Time   CALCIUM 9.4 08/03/2016 0940   ALKPHOS 94 08/03/2016 0940   AST 12 08/03/2016 0940   ALT 9 08/03/2016 0940   BILITOT 0.29 08/03/2016 0940       Lab Results  Component Value Date   WBC 5.2 08/03/2016   HGB 12.1 08/03/2016   HCT 37.3 08/03/2016   MCV 80.2 08/03/2016   PLT 208 08/03/2016   NEUTROABS 2.7 08/03/2016    ASSESSMENT & PLAN:  Large B-cell  lymphoma Aggressive large B-cell lymphoma: CD5 and 10 negative, CD20 positive BCL 2 and BCL 6 positive: Differential diagnosis aggressive large B-cell lymphoma versus B-cell lymphoma unclassified, Ki-67 100%, bulky lymphadenopathy supraclavicular and axillary lymph nodes, bone marrow negative, stage IIB   Treatment Summary: R CHOP chemotherapy started 01/16/2014 completed 05/01/14  PET-CT 05/29/14: Near CR  CT chest abdomen pelvis 07/31/2016: No evidence of lymphoma  Chronic hepatitis C:Liver function tests are adequate, CT abdomen suggests mild cirrhosis. Patient sees gastroenterology.  Return to clinic in 1 year for follow-up with labs and CT scans. Scans once a year until 5 years   I spent 25 minutes talking to the patient of which more than half was spent in counseling and coordination of care.  Orders Placed This Encounter  Procedures  . CT Abdomen Pelvis W Contrast    Standing Status:   Future    Standing Expiration Date:   08/03/2017    Order Specific Question:  If indicated for the ordered procedure, I authorize the administration of contrast media per Radiology protocol    Answer:   Yes    Order Specific Question:   Reason for Exam (SYMPTOM  OR DIAGNOSIS REQUIRED)    Answer:   Diffuse large B cell Lymphoma restaging    Order Specific Question:   Preferred imaging location?    Answer:   [facility]    Order Specific Question:   Radiology Contrast Protocol - do NOT remove file path    Answer:   \\[email or web address]  . CT Chest W Contrast    Standing Status:   Future    Standing Expiration Date:   08/03/2017    Order Specific Question:   If indicated for the ordered procedure, I authorize the administration of contrast media per Radiology protocol    Answer:   Yes    Order Specific Question:   Reason for Exam (SYMPTOM  OR DIAGNOSIS REQUIRED)    Answer:   Diffuse large B cell Lymphoma restaging    Order Specific Question:   Preferred  imaging location?    Answer:   [facility]    Order Specific Question:   Radiology Contrast Protocol - do NOT remove file path    Answer:   \\[email or web address]   The patient has a good understanding of the overall plan. she agrees with it. she will call with any problems that may develop before the next visit here.   Rulon Eisenmenger, MD 08/03/16

## 2016-08-03 NOTE — Assessment & Plan Note (Signed)
Aggressive large B-cell lymphoma: CD5 and 10 negative, CD20 positive BCL 2 and BCL 6 positive: Differential diagnosis aggressive large B-cell lymphoma versus B-cell lymphoma unclassified, Ki-67 100%, bulky lymphadenopathy supraclavicular and axillary lymph nodes, bone marrow negative, stage IIB   Treatment Summary: R CHOP chemotherapy started 01/16/2014 completed 05/01/14  PET-CT 05/29/14: Near CR  CT chest abdomen pelvis 07/31/2016: No evidence of lymphoma  Chronic hepatitis C:Liver function tests are adequate, CT abdomen suggests mild cirrhosis. Patient sees gastroenterology.  Return to clinic in 6 months for follow-up with labs. Scans once a year

## 2016-12-06 ENCOUNTER — Encounter: Payer: Self-pay | Admitting: Gastroenterology

## 2016-12-08 ENCOUNTER — Other Ambulatory Visit: Payer: Self-pay | Admitting: Specialist

## 2016-12-08 DIAGNOSIS — R5381 Other malaise: Secondary | ICD-10-CM

## 2017-01-25 ENCOUNTER — Telehealth: Payer: Self-pay | Admitting: Gastroenterology

## 2017-01-25 ENCOUNTER — Ambulatory Visit (INDEPENDENT_AMBULATORY_CARE_PROVIDER_SITE_OTHER): Payer: Medicare Other | Admitting: Gastroenterology

## 2017-01-25 ENCOUNTER — Encounter: Payer: Self-pay | Admitting: Gastroenterology

## 2017-01-25 VITALS — BP 106/68 | HR 66 | Ht 66.0 in | Wt 192.0 lb

## 2017-01-25 DIAGNOSIS — R140 Abdominal distension (gaseous): Secondary | ICD-10-CM | POA: Diagnosis not present

## 2017-01-25 DIAGNOSIS — K5901 Slow transit constipation: Secondary | ICD-10-CM | POA: Diagnosis not present

## 2017-01-25 DIAGNOSIS — R112 Nausea with vomiting, unspecified: Secondary | ICD-10-CM

## 2017-01-25 DIAGNOSIS — K5902 Outlet dysfunction constipation: Secondary | ICD-10-CM

## 2017-01-25 NOTE — Telephone Encounter (Addendum)
Good to know, since 72 micrograms was the dose listed in chart today - seems she must have been uncertai of the dose until she checked at home. use the samples  Since we do not wat to waste them.  Then only other option is to increase miralax to 2 capfuls twice daily.

## 2017-01-25 NOTE — Progress Notes (Signed)
[facility] Consult Note:  History: Rachael West 01/25/2017  Referring physician: Care, Jinny Blossom [facility] - Lillia Corporal, MD  Reason for consult/chief complaint: Constipation (pt reports constipation for which she takes miralax twice a day and Linzess; she states these help but she never feels she completely evacuates; occasional nausea and vomiting, helped with Zofran)   Subjective  HPI:  This is a 62 year old woman referred by primary care noted above for chronic constipation.  According to the referral, she saw Dr. Oletta Lamas at the [facility] in 2013.  He appears to have referred her for evaluation at [facility] in April 2014.  I reviewed that clinic note from Dr.Fina  in detail, they suggest the patient add Linzess to her MiraLAX.  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 [name] is currently on a capful of MiraLAX twice daily as well as Linzess 72 mcg once daily.  She is a somewhat limited historian, but does not think she had been on any other doses of this medicine.  Again, she appeared to have been prescribed a higher dose back in 2014.  She does not recall having had diarrhea as a side effect. Her symptoms are the same as they had been for years, with infrequent or small volume stools.  She typically has a BM 2 or 3 times a day, but says they are small and pellet like.  I asked if she recalled the [facility] evaluation, and she had what sounds like biofeedback, and says she follows those recommendations with every bowel movement.  Nevertheless she continues to have the symptoms.  They have not changed over the years, she denies rectal bleeding. She also has occasional nausea and vomiting that is improved with Zofran. She denies dysphagia or odynophagia or unexpected weight  loss. ROS:  Review of Systems  Constitutional: Negative for appetite change and unexpected weight change.  HENT: Negative for mouth sores and voice change.   Eyes: Negative for pain and redness.  Respiratory: Negative for cough and shortness of breath.   Cardiovascular: Negative for chest pain and palpitations.  Genitourinary: Negative for dysuria and hematuria.  Musculoskeletal: Positive for arthralgias and back pain. Negative for myalgias.  Skin: Negative for pallor and rash.  Neurological: Negative for weakness and headaches.  Hematological: Negative for adenopathy.     Past Medical History: Past Medical History:  Diagnosis Date  . Anemia   . Anxiety   . Arthritis   . Asthma   . Cancer [facility]) 12-2013   aggressive lymphoma  . Depression   . GERD (gastroesophageal reflux disease)   . Hepatitis-C   . History of chemotherapy   . Hypertension   . Insomnia   . Personal history of non-Hodgkin lymphomas   . Vitamin D deficiency    NHL  HCV treated  Past Surgical History: Past Surgical History:  Procedure Laterality Date  . ABDOMINAL HYSTERECTOMY    . APPENDECTOMY    . COLONOSCOPY  01/17/2011   Procedure: COLONOSCOPY;  Surgeon: Winfield Cunas., MD;  Location: [facility];  Service: Endoscopy;  Laterality: N/A;  . ESOPHAGOGASTRODUODENOSCOPY  01/17/2011   Procedure: ESOPHAGOGASTRODUODENOSCOPY (EGD);  Surgeon: Winfield Cunas., MD;  Location: [facility];  Service: Endoscopy;  Laterality: N/A;  . TRIGGER FINGER RELEASE       Family History: Family History  Problem Relation Age of Onset  . Asthma Mother   . Allergies Mother   . Asthma Sister   . Heart disease Father   . Heart disease Brother   . Diabetes Maternal Grandmother   . Diabetes Maternal Grandfather   . Heart disease Paternal Grandmother   . Heart disease Paternal Grandfather   . Colon cancer Paternal Aunt   . Liver cancer Paternal Uncle   . Breast cancer Cousin   . Ovarian cancer Other         great aunt (m)  . Pancreatic cancer Other        great aunt (m)    Social History: Social History   Socioeconomic History  . Marital status: Divorced    Spouse name: None  . Number of children: None  . Years of education: None  . Highest education level: None  Social Needs  . Financial resource strain: None  . Food insecurity - worry: None  . Food insecurity - inability: None  . Transportation needs - medical: None  . Transportation needs - non-medical: None  Occupational History  . Occupation: Disabled  Tobacco Use  . Smoking status: Former Smoker    Packs/day: 1.50    Years: 20.00    Pack years: 30.00    Types: Cigarettes    Last attempt to quit: 01/09/1990    Years since quitting: 27.0  . Smokeless tobacco: Never Used  Substance and Sexual Activity  . Alcohol use: No    Alcohol/week: 0.0 oz  . Drug use: No  . Sexual activity: None  Other Topics Concern  . None  Social History Narrative  . None    Allergies: Allergies  Allergen Reactions  . Latex Hives  . Penicillins Hives  . Codeine Anxiety  . Cortisone Swelling  . Sulfa Antibiotics     Outpatient Meds: Current Outpatient Medications  Medication Sig Dispense Refill  . bimatoprost (LUMIGAN) 0.01 % SOLN Place 1 drop into both eyes at bedtime.    . busPIRone (BUSPAR) 5 MG tablet Take 1 tablet (5 mg total) by mouth 2 (two) times daily.    Marland Kitchen DICLOFENAC PO Take by mouth.    . hydrOXYzine (VISTARIL) 50 MG capsule Take 50 mg by mouth at bedtime.    Marland Kitchen linaclotide (LINZESS) 72 MCG capsule Take 1 capsule (72 mcg total) by mouth daily before breakfast. 30 capsule   . omeprazole (PRILOSEC) 40 MG capsule 1 CAPSULE BY MOUTH DAILY  3  . paliperidone (INVEGA) 6 MG 24 hr tablet Take 6 mg by mouth every morning.    . prazosin (MINIPRESS) 1 MG capsule Take 1 mg by mouth at bedtime.    Marland Kitchen PROAIR HFA 108 (90 Base) MCG/ACT inhaler 1-2 PUFFS INHALE EVERY 4-6 HOURS AS NEEDED (PRN)  5  . sertraline (ZOLOFT) 100 MG tablet Take  200 mg by mouth every morning.    . traMADol (ULTRAM) 50 MG tablet Take 50 mg by mouth 3 (three) times daily.    . traZODone (DESYREL) 50 MG tablet 1 TABLET BY MOUTH AT BEDTIME  5   No current facility-administered medications for this visit.       ___________________________________________________________________ Objective   Exam:  BP 106/68   Pulse 66   Ht 5\' 6"  (1.676 m)   Wt 192 lb (87.1 kg)   BMI 30.99 kg/m    General: this is a(n) well-appearing woman with an antalgic gait  Eyes: sclera anicteric, no redness  ENT: oral mucosa moist without lesions,  no cervical or supraclavicular lymphadenopathy, good dentition  CV: RRR without murmur, S1/S2, no JVD, no peripheral edema  Resp: clear to auscultation bilaterally, normal RR and effort noted  GI: soft, no tenderness, with active bowel sounds. No guarding or palpable organomegaly noted.  Skin; warm and dry, no rash or jaundice noted  Neuro: awake, alert and oriented x 3. Normal gross motor function and fluent speech  Labs: Previous records as noted above Colonoscopy report by Dr. Oletta Lamas on 01/17/2011 was a complete exam to the cecum and revealed no polyps.   Assessment: Encounter Diagnoses  Name Primary?  . Dyssynergic constipation Yes  . Slow transit constipation   . Abdominal bloating   . 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 It has had a thorough workup and the diagnosis is clear.  As such, I do not think she needs any further testing at this point.  We discussed how we have limited therapies available, she is already engaging in biofeedback measures.  We can make some minor adjustments to her MiraLAX and Linzess, but I suspect that is all we will be able to do.  She certainly cannot stop that regimen, because without it she reports a BM once a week or less.  Plan:  Continue  current MiraLAX dose, but increase Linzess 245 mcg daily.  If that does not help after about a week, increase to 290 mcg daily.  We gave her a sufficient sample supply and instructions on this. Increases in the MiraLAX dose could be made afterwards if needed. She will call for advice and see me as needed and I think this can be managed here after by primary care unless there is a change in her condition.   Thank you for the courtesy of this consult.  Please call me with any questions or concerns.  Nelida Meuse III  CC: Care, Jinny Blossom [facility]  Lillia Corporal, MD

## 2017-01-25 NOTE — Patient Instructions (Signed)
If you are age 64 or older, your body mass index should be between 23-30. Your Body mass index is 30.99 kg/m. If this is out of the aforementioned range listed, please consider follow up with your Primary Care Provider.  If you are age 32 or younger, your body mass index should be between 19-25. Your Body mass index is 30.99 kg/m. If this is out of the aformentioned range listed, please consider follow up with your Primary Care Provider.   Linzess 145 mcg one a day #8 samples. Try these first. If they don't help improve the bowel movement start the sample Linzess 290 mcg once a day. Call and let us know what works best for you and we can send a prescription to your pharmacy.  Thank you for choosing [facility]  Dr Wilfrid Lund III

## 2017-01-25 NOTE — Telephone Encounter (Signed)
Pt states she is already taking the Linzess 260mcg. Given to her by her PCP Dr Kennon Holter. Please advise.

## 2017-01-26 NOTE — Telephone Encounter (Addendum)
Pt has been notified and aware. She agrees to increase her Miralax as directed.

## 2017-03-11 ENCOUNTER — Encounter: Payer: Self-pay | Admitting: Gastroenterology

## 2017-04-26 ENCOUNTER — Ambulatory Visit: Payer: Medicare Other | Admitting: Adult Health
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
## 2017-05-01 ENCOUNTER — Encounter: Payer: Self-pay | Admitting: Adult Health

## 2017-05-01 ENCOUNTER — Ambulatory Visit (INDEPENDENT_AMBULATORY_CARE_PROVIDER_SITE_OTHER): Payer: Medicare Other | Admitting: Adult Health

## 2017-05-01 VITALS — BP 130/84 | HR 86 | Ht 66.0 in | Wt 204.9 lb

## 2017-05-01 DIAGNOSIS — E6609 Other obesity due to excess calories: Secondary | ICD-10-CM | POA: Diagnosis not present

## 2017-05-01 DIAGNOSIS — Z6833 Body mass index (BMI) 33.0-33.9, adult: Secondary | ICD-10-CM | POA: Diagnosis not present

## 2017-05-01 DIAGNOSIS — K5790 Diverticulosis of intestine, part unspecified, without perforation or abscess without bleeding: Secondary | ICD-10-CM

## 2017-05-01 DIAGNOSIS — Z0000 Encounter for general adult medical examination without abnormal findings: Secondary | ICD-10-CM | POA: Insufficient documentation

## 2017-05-01 DIAGNOSIS — K7469 Other cirrhosis of liver: Secondary | ICD-10-CM

## 2017-05-01 DIAGNOSIS — B182 Chronic viral hepatitis C: Secondary | ICD-10-CM

## 2017-05-01 DIAGNOSIS — K219 Gastro-esophageal reflux disease without esophagitis: Secondary | ICD-10-CM | POA: Insufficient documentation

## 2017-05-01 DIAGNOSIS — K5904 Chronic idiopathic constipation: Secondary | ICD-10-CM | POA: Diagnosis not present

## 2017-05-01 DIAGNOSIS — F419 Anxiety disorder, unspecified: Secondary | ICD-10-CM | POA: Insufficient documentation

## 2017-05-01 DIAGNOSIS — F329 Major depressive disorder, single episode, unspecified: Secondary | ICD-10-CM | POA: Diagnosis not present

## 2017-05-01 DIAGNOSIS — F32A Depression, unspecified: Secondary | ICD-10-CM

## 2017-05-01 DIAGNOSIS — I10 Essential (primary) hypertension: Secondary | ICD-10-CM

## 2017-05-01 MED ORDER — MONTELUKAST SODIUM 10 MG PO TABS: 10.0000 mg | ORAL_TABLET | Freq: Every day | ORAL | 3 refills | Status: DC

## 2017-05-01 NOTE — Patient Instructions (Addendum)
Mediterranean Diet A Mediterranean diet refers to food and lifestyle choices that are based on the traditions of countries located on the [place]. This way of eating has been shown to help prevent certain conditions and improve outcomes for people who have chronic diseases, like kidney disease and heart disease. What are tips for following this plan? Lifestyle  Cook and eat meals together with your family, when possible.  Drink enough fluid to keep your urine clear or pale yellow.  Be physically active every day. This includes: ? Aerobic exercise like running or swimming. ? Leisure activities like gardening, walking, or housework.  Get 7-8 hours of sleep each night.  If recommended by your health care provider, drink red wine in moderation. This means 1 glass a day for nonpregnant women and 2 glasses a day for men. A glass of wine equals 5 oz (150 mL). Reading food labels  Check the serving size of packaged foods. For foods such as rice and pasta, the serving size refers to the amount of cooked product, not dry.  Check the total fat in packaged foods. Avoid foods that have saturated fat or trans fats.  Check the ingredients list for added sugars, such as corn syrup. Shopping  At the grocery store, buy most of your food from the areas near the walls of the store. This includes: ? Fresh fruits and vegetables (produce). ? Grains, beans, nuts, and seeds. Some of these may be available in unpackaged forms or large amounts (in bulk). ? Fresh seafood. ? Poultry and eggs. ? Low-fat dairy products.  Buy whole ingredients instead of prepackaged foods.  Buy fresh fruits and vegetables in-season from local farmers markets.  Buy frozen fruits and vegetables in resealable bags.  If you do not have access to quality fresh seafood, buy precooked frozen shrimp or canned fish, such as tuna, salmon, or sardines.  Buy small amounts of raw or cooked vegetables, salads, or olives from the  deli or salad bar at your store.  Stock your pantry so you always have certain foods on hand, such as olive oil, canned tuna, canned tomatoes, rice, pasta, and beans. Cooking  Cook foods with extra-virgin olive oil instead of using butter or other vegetable oils.  Have meat as a side dish, and have vegetables or grains as your main dish. This means having meat in small portions or adding small amounts of meat to foods like pasta or stew.  Use beans or vegetables instead of meat in common dishes like chili or lasagna.  Experiment with different cooking methods. Try roasting or broiling vegetables instead of steaming or sauteing them.  Add frozen vegetables to soups, stews, pasta, or rice.  Add nuts or seeds for added healthy fat at each meal. You can add these to yogurt, salads, or vegetable dishes.  Marinate fish or vegetables using olive oil, lemon juice, garlic, and fresh herbs. Meal planning  Plan to eat 1 vegetarian meal one day each week. Try to work up to 2 vegetarian meals, if possible.  Eat seafood 2 or more times a week.  Have healthy snacks readily available, such as: ? Vegetable sticks with hummus. ? Greek yogurt. ? Fruit and nut trail mix.  Eat balanced meals throughout the week. This includes: ? Fruit: 2-3 servings a day ? Vegetables: 4-5 servings a day ? Low-fat dairy: 2 servings a day ? Fish, poultry, or lean meat: 1 serving a day ? Beans and legumes: 2 or more servings a week ? Nuts   and seeds: 1-2 servings a day ? Whole grains: 6-8 servings a day ? Extra-virgin olive oil: 3-4 servings a day  Limit red meat and sweets to only a few servings a month What are my food choices?  Mediterranean diet ? Recommended ? Grains: Whole-grain pasta. Brown rice. Bulgar wheat. Polenta. Couscous. Whole-wheat bread. [place]. ? Vegetables: Artichokes. Beets. Broccoli. Cabbage. Carrots. Eggplant. Green beans. Chard. Kale. Spinach. Onions. Leeks. Peas. Squash.  Tomatoes. Peppers. Radishes. ? Fruits: Apples. Apricots. Avocado. Berries. Bananas. Cherries. Dates. Figs. Grapes. Lemons. Melon. Oranges. Peaches. Plums. Pomegranate. ? Meats and other protein foods: Beans. Almonds. Sunflower seeds. Pine nuts. Peanuts. [place]. Salmon. Scallops. Shrimp. [place]. Tilapia. Clams. Oysters. Eggs. ? Dairy: Low-fat milk. Cheese. Greek yogurt. ? Beverages: Water. Red wine. Herbal tea. ? Fats and oils: Extra virgin olive oil. Avocado oil. Grape seed oil. ? Sweets and desserts: [place] yogurt with honey. Baked apples. Poached pears. Trail mix. ? Seasoning and other foods: Basil. Cilantro. Coriander. Cumin. Mint. Parsley. Sage. Rosemary. Tarragon. Garlic. Oregano. Thyme. Pepper. Balsalmic vinegar. Tahini. Hummus. Tomato sauce. Olives. Mushrooms. ? Limit these ? Grains: Prepackaged pasta or rice dishes. Prepackaged cereal with added sugar. ? Vegetables: Deep fried potatoes (french fries). ? Fruits: Fruit canned in syrup. ? Meats and other protein foods: Beef. Pork. Lamb. Poultry with skin. Hot dogs. [place] Salines. ? Dairy: Ice cream. Sour cream. Whole milk. ? Beverages: Juice. Sugar-sweetened soft drinks. Beer. Liquor and spirits. ? Fats and oils: Butter. Canola oil. Vegetable oil. Beef fat (tallow). Lard. ? Sweets and desserts: Cookies. Cakes. Pies. Candy. ? Seasoning and other foods: Mayonnaise. Premade sauces and marinades. ? The items listed may not be a complete list. Talk with your dietitian about what dietary choices are right for you. Summary  The Mediterranean diet includes both food and lifestyle choices.  Eat a variety of fresh fruits and vegetables, beans, nuts, seeds, and whole grains.  Limit the amount of red meat and sweets that you eat.  Talk with your health care provider about whether it is safe for you to drink red wine in moderation. This means 1 glass a day for nonpregnant women and 2 glasses a day for men. A glass of wine equals 5 oz (150 mL). This information  is not intended to replace advice given to you by your health care provider. Make sure you discuss any questions you have with your health care provider. Document Released: 08/19/2015 Document Revised: 09/21/2015 Document Reviewed: 08/19/2015 Elsevier Interactive Patient Education  2018 [employer].   Generalized Anxiety Disorder, Adult Generalized anxiety disorder (GAD) is a mental health disorder. People with this condition constantly worry about everyday events. Unlike normal anxiety, worry related to GAD is not triggered by a specific event. These worries also do not fade or get better with time. GAD interferes with life functions, including relationships, work, and school. GAD can vary from mild to severe. People with severe GAD can have intense waves of anxiety with physical symptoms (panic attacks). What are the causes? The exact cause of GAD is not known. What increases the risk? This condition is more likely to develop in:  Women.  People who have a family history of anxiety disorders.  People who are very shy.  People who experience very stressful life events, such as the death of a loved one.  People who have a very stressful family environment.  What are the signs or symptoms? People with GAD often worry excessively about many things in their lives, such as their health  and family. They may also be overly concerned about:  Doing well at work.  Being on time.  Natural disasters.  Friendships.  Physical symptoms of GAD include:  Fatigue.  Muscle tension or having muscle twitches.  Trembling or feeling shaky.  Being easily startled.  Feeling like your heart is pounding or racing.  Feeling out of breath or like you cannot take a deep breath.  Having trouble falling asleep or staying asleep.  Sweating.  Nausea, diarrhea, or irritable bowel syndrome (IBS).  Headaches.  Trouble concentrating or remembering facts.  Restlessness.  Irritability.  How  is this diagnosed? Your health care provider can diagnose GAD based on your symptoms and medical history. You will also have a physical exam. The health care provider will ask specific questions about your symptoms, including how severe they are, when they started, and if they come and go. Your health care provider may ask you about your use of alcohol or drugs, including prescription medicines. Your health care provider may refer you to a mental health specialist for further evaluation. Your health care provider will do a thorough examination and may perform additional tests to rule out other possible causes of your symptoms. To be diagnosed with GAD, a person must have anxiety that:  Is out of his or her control.  Affects several different aspects of his or her life, such as work and relationships.  Causes distress that makes him or her unable to take part in normal activities.  Includes at least three physical symptoms of GAD, such as restlessness, fatigue, trouble concentrating, irritability, muscle tension, or sleep problems.  Before your health care provider can confirm a diagnosis of GAD, these symptoms must be present more days than they are not, and they must last for six months or longer. How is this treated? The following therapies are usually used to treat GAD:  Medicine. Antidepressant medicine is usually prescribed for long-term daily control. Antianxiety medicines may be added in severe cases, especially when panic attacks occur.  Talk therapy (psychotherapy). Certain types of talk therapy can be helpful in treating GAD by providing support, education, and guidance. Options include: ? Cognitive behavioral therapy (CBT). People learn coping skills and techniques to ease their anxiety. They learn to identify unrealistic or negative thoughts and behaviors and to replace them with positive ones. ? Acceptance and commitment therapy (ACT). This treatment teaches people how to be mindful  as a way to cope with unwanted thoughts and feelings. ? Biofeedback. This process trains you to manage your body's response (physiological response) through breathing techniques and relaxation methods. You will work with a therapist while machines are used to monitor your physical symptoms.  Stress management techniques. These include yoga, meditation, and exercise.  A mental health specialist can help determine which treatment is best for you. Some people see improvement with one type of therapy. However, other people require a combination of therapies. Follow these instructions at home:  Take over-the-counter and prescription medicines only as told by your health care provider.  Try to maintain a normal routine.  Try to anticipate stressful situations and allow extra time to manage them.  Practice any stress management or self-calming techniques as taught by your health care provider.  Do not punish yourself for setbacks or for not making progress.  Try to recognize your accomplishments, even if they are small.  Keep all follow-up visits as told by your health care provider. This is important. Contact a health care provider if:  Your  symptoms do not get better.  Your symptoms get worse.  You have signs of depression, such as: ? A persistently sad, cranky, or irritable mood. ? Loss of enjoyment in activities that used to bring you joy. ? Change in weight or eating. ? Changes in sleeping habits. ? Avoiding friends or family members. ? Loss of energy for normal tasks. ? Feelings of guilt or worthlessness. Get help right away if:  You have serious thoughts about hurting yourself or others. If you ever feel like you may hurt yourself or others, or have thoughts about taking your own life, get help right away. You can go to your nearest emergency department or call:  Your local emergency services (911 in the [place]  A suicide crisis helpline, such as the [facility] [facility] at (986)051-7171. This is open 24 hours a day.  Summary  Generalized anxiety disorder (GAD) is a mental health disorder that involves worry that is not triggered by a specific event.  People with GAD often worry excessively about many things in their lives, such as their health and family.  GAD may cause physical symptoms such as restlessness, trouble concentrating, sleep problems, frequent sweating, nausea, diarrhea, headaches, and trembling or muscle twitching.  A mental health specialist can help determine which treatment is best for you. Some people see improvement with one type of therapy. However, other people require a combination of therapies. This information is not intended to replace advice given to you by your health care provider. Make sure you discuss any questions you have with your health care provider. Document Released: 04/22/2012 Document Revised: 11/16/2015 Document Reviewed: 11/16/2015 Elsevier Interactive Patient Education  2018 [employer].   Hypertension Hypertension, commonly called high blood pressure, is when the force of blood pumping through the arteries is too strong. The arteries are the blood vessels that carry blood from the heart throughout the body. Hypertension forces the heart to work harder to pump blood and may cause arteries to become narrow or stiff. Having untreated or uncontrolled hypertension can cause heart attacks, strokes, kidney disease, and other problems. A blood pressure reading consists of a higher number over a lower number. Ideally, your blood pressure should be below 120/80. The first ("top") number is called the systolic pressure. It is a measure of the pressure in your arteries as your heart beats. The second ("bottom") number is called the diastolic pressure. It is a measure of the pressure in your arteries as the heart relaxes. What are the causes? The cause of this condition is not known. What increases the  risk? Some risk factors for high blood pressure are under your control. Others are not. Factors you can change  Smoking.  Having type 2 diabetes mellitus, high cholesterol, or both.  Not getting enough exercise or physical activity.  Being overweight.  Having too much fat, sugar, calories, or salt (sodium) in your diet.  Drinking too much alcohol. Factors that are difficult or impossible to change  Having chronic kidney disease.  Having a family history of high blood pressure.  Age. Risk increases with age.  Race. You may be at higher risk if you are African-American.  Gender. Men are at higher risk than women before age 62. After age 57, women are at higher risk than men.  Having obstructive sleep apnea.  Stress. What are the signs or symptoms? Extremely high blood pressure (hypertensive crisis) may cause:  Headache.  Anxiety.  Shortness of breath.  Nosebleed.  Nausea and vomiting.  Severe chest pain.  Jerky movements you cannot control (seizures).  How is this diagnosed? This condition is diagnosed by measuring your blood pressure while you are seated, with your arm resting on a surface. The cuff of the blood pressure monitor will be placed directly against the skin of your upper arm at the level of your heart. It should be measured at least twice using the same arm. Certain conditions can cause a difference in blood pressure between your right and left arms. Certain factors can cause blood pressure readings to be lower or higher than normal (elevated) for a short period of time:  When your blood pressure is higher when you are in a health care provider's office than when you are at home, this is called white coat hypertension. Most people with this condition do not need medicines.  When your blood pressure is higher at home than when you are in a health care provider's office, this is called masked hypertension. Most people with this condition may need  medicines to control blood pressure.  If you have a high blood pressure reading during one visit or you have normal blood pressure with other risk factors:  You may be asked to return on a different day to have your blood pressure checked again.  You may be asked to monitor your blood pressure at home for 1 week or longer.  If you are diagnosed with hypertension, you may have other blood or imaging tests to help your health care provider understand your overall risk for other conditions. How is this treated? This condition is treated by making healthy lifestyle changes, such as eating healthy foods, exercising more, and reducing your alcohol intake. Your health care provider may prescribe medicine if lifestyle changes are not enough to get your blood pressure under control, and if:  Your systolic blood pressure is above 130.  Your diastolic blood pressure is above 80.  Your personal target blood pressure may vary depending on your medical conditions, your age, and other factors. Follow these instructions at home: Eating and drinking  Eat a diet that is high in fiber and potassium, and low in sodium, added sugar, and fat. An example eating plan is called the DASH (Dietary Approaches to Stop Hypertension) diet. To eat this way: ? Eat plenty of fresh fruits and vegetables. Try to fill half of your plate at each meal with fruits and vegetables. ? Eat whole grains, such as whole wheat pasta, brown rice, or whole grain bread. Fill about one quarter of your plate with whole grains. ? Eat or drink low-fat dairy products, such as skim milk or low-fat yogurt. ? Avoid fatty cuts of meat, processed or cured meats, and poultry with skin. Fill about one quarter of your plate with lean proteins, such as fish, chicken without skin, beans, eggs, and tofu. ? Avoid premade and processed foods. These tend to be higher in sodium, added sugar, and fat.  Reduce your daily sodium intake. Most people with  hypertension should eat less than 1,500 mg of sodium a day.  Limit alcohol intake to no more than 1 drink a day for nonpregnant women and 2 drinks a day for men. One drink equals 12 oz of beer, 5 oz of wine, or 1 oz of hard liquor. Lifestyle  Work with your health care provider to maintain a healthy body weight or to lose weight. Ask what an ideal weight is for you.  Get at least 30 minutes of exercise that causes your  heart to beat faster (aerobic exercise) most days of the week. Activities may include walking, swimming, or biking.  Include exercise to strengthen your muscles (resistance exercise), such as pilates or lifting weights, as part of your weekly exercise routine. Try to do these types of exercises for 30 minutes at least 3 days a week.  Do not use any products that contain nicotine or tobacco, such as cigarettes and e-cigarettes. If you need help quitting, ask your health care provider.  Monitor your blood pressure at home as told by your health care provider.  Keep all follow-up visits as told by your health care provider. This is important. Medicines  Take over-the-counter and prescription medicines only as told by your health care provider. Follow directions carefully. Blood pressure medicines must be taken as prescribed.  Do not skip doses of blood pressure medicine. Doing this puts you at risk for problems and can make the medicine less effective.  Ask your health care provider about side effects or reactions to medicines that you should watch for. Contact a health care provider if:  You think you are having a reaction to a medicine you are taking.  You have headaches that keep coming back (recurring).  You feel dizzy.  You have swelling in your ankles.  You have trouble with your vision. Get help right away if:  You develop a severe headache or confusion.  You have unusual weakness or numbness.  You feel faint.  You have severe pain in your chest or  abdomen.  You vomit repeatedly.  You have trouble breathing. Summary  Hypertension is when the force of blood pumping through your arteries is too strong. If this condition is not controlled, it may put you at risk for serious complications.  Your personal target blood pressure may vary depending on your medical conditions, your age, and other factors. For most people, a normal blood pressure is less than 120/80.  Hypertension is treated with lifestyle changes, medicines, or a combination of both. Lifestyle changes include weight loss, eating a healthy, low-sodium diet, exercising more, and limiting alcohol. This information is not intended to replace advice given to you by your health care provider. Make sure you discuss any questions you have with your health care provider. Document Released: 12/26/2004 Document Revised: 11/24/2015 Document Reviewed: 11/24/2015 Elsevier Interactive Patient Education  [employer].   Please continue all medications as directed. Continue with [facility]. Continue with [facility] as directed. Continue to drink plenty of water and avoid all alcohol. Increase regular movement as tolerated. Continue with Orthopedics as directed. Recommend regular follow-up in 3 months with fasting labs. WELCOME TO THE PRACTICE!

## 2017-05-01 NOTE — Assessment & Plan Note (Signed)
BP at goal 130/84, Hr 86 She was previously on Amlodipine 5mg  QD, d/c'd 01/25/17 due to hypotension

## 2017-05-01 NOTE — Assessment & Plan Note (Addendum)
Under care of [facility] Currently taking Buspar 5mg  BID, Sertraline 200mg  QD

## 2017-05-01 NOTE — Assessment & Plan Note (Signed)
Recently seen by [facility]

## 2017-05-01 NOTE — Assessment & Plan Note (Addendum)
HCV-  She completed course of Harvoni.  Last HCV Quantitative was undetectable 03/09/15 and she was deemed cured. 02/02/15- CT scan of abd revealed possible cirrhosis She has not seen ID since 03/09/15

## 2017-05-01 NOTE — Assessment & Plan Note (Signed)
F/u 3 months for OV with fasting labs. Continue with GI, Pulmonary, and [facility] as directed.

## 2017-05-09 ENCOUNTER — Other Ambulatory Visit: Payer: Self-pay | Admitting: Adult Health

## 2017-05-10 LAB — CBC WITH DIFFERENTIAL/PLATELET
BASOS: 1 %
Basophils Absolute: 0 10*3/uL (ref 0.0–0.2)
EOS (ABSOLUTE): 0.1 10*3/uL (ref 0.0–0.4)
Eos: 2 %
Hematocrit: 36.4 % (ref 34.0–46.6)
Hemoglobin: 11.8 g/dL (ref 11.1–15.9)
IMMATURE GRANS (ABS): 0 10*3/uL (ref 0.0–0.1)
Immature Granulocytes: 0 %
LYMPHS ABS: 2.5 10*3/uL (ref 0.7–3.1)
Lymphs: 47 %
MCH: 26 pg — AB (ref 26.6–33.0)
MCHC: 32.4 g/dL (ref 31.5–35.7)
MCV: 80 fL (ref 79–97)
Monocytes Absolute: 0.3 10*3/uL (ref 0.1–0.9)
Monocytes: 6 %
NEUTROS ABS: 2.3 10*3/uL (ref 1.4–7.0)
Neutrophils: 44 %
PLATELETS: 179 10*3/uL (ref 150–379)
RBC: 4.54 x10E6/uL (ref 3.77–5.28)
RDW: 15 % (ref 12.3–15.4)
WBC: 5.3 10*3/uL (ref 3.4–10.8)

## 2017-05-10 LAB — LIPID PANEL W/O CHOL/HDL RATIO
Cholesterol, Total: 201 mg/dL — ABNORMAL HIGH (ref 100–199)
HDL: 46 mg/dL (ref >39)
LDL Calculated: 132 mg/dL — ABNORMAL HIGH (ref 0–99)
Triglycerides: 114 mg/dL (ref 0–149)
VLDL Cholesterol Cal: 23 mg/dL (ref 5–40)

## 2017-05-10 LAB — COMPREHENSIVE METABOLIC PANEL
A/G RATIO: 1.1 — AB (ref 1.2–2.2)
ALT: 13 IU/L (ref 0–32)
AST: 18 IU/L (ref 0–40)
Albumin: 4.1 g/dL (ref 3.6–4.8)
Alkaline Phosphatase: 87 IU/L (ref 39–117)
BUN / CREAT RATIO: 14 (ref 12–28)
BUN: 10 mg/dL (ref 8–27)
CHLORIDE: 105 mmol/L (ref 96–106)
CO2: 24 mmol/L (ref 20–29)
Calcium: 9.1 mg/dL (ref 8.7–10.3)
Creatinine, Ser: 0.7 mg/dL (ref 0.57–1.00)
GFR calc non Af Amer: 93 mL/min/{1.73_m2} (ref >59)
GFR, EST AFRICAN AMERICAN: 107 mL/min/{1.73_m2} (ref >59)
Globulin, Total: 3.8 g/dL (ref 1.5–4.5)
Glucose: 86 mg/dL (ref 65–99)
POTASSIUM: 4.5 mmol/L (ref 3.5–5.2)
Sodium: 144 mmol/L (ref 134–144)
TOTAL PROTEIN: 7.9 g/dL (ref 6.0–8.5)

## 2017-05-10 LAB — TSH: TSH: 1.84 u[IU]/mL (ref 0.450–4.500)

## 2017-05-10 LAB — HGB A1C W/O EAG: Hgb A1c MFr Bld: 5.7 % — ABNORMAL HIGH (ref 4.8–5.6)

## 2017-06-01 ENCOUNTER — Encounter: Payer: Self-pay | Admitting: Gastroenterology

## 2017-06-01 ENCOUNTER — Ambulatory Visit (INDEPENDENT_AMBULATORY_CARE_PROVIDER_SITE_OTHER): Payer: Medicare Other | Admitting: Gastroenterology

## 2017-06-01 VITALS — BP 120/72 | HR 88 | Ht 65.07 in | Wt 209.5 lb

## 2017-06-01 DIAGNOSIS — R140 Abdominal distension (gaseous): Secondary | ICD-10-CM | POA: Diagnosis not present

## 2017-06-01 DIAGNOSIS — K5901 Slow transit constipation: Secondary | ICD-10-CM

## 2017-06-01 DIAGNOSIS — Z8619 Personal history of other infectious and parasitic diseases: Secondary | ICD-10-CM

## 2017-06-01 DIAGNOSIS — K5902 Outlet dysfunction constipation: Secondary | ICD-10-CM

## 2017-06-01 NOTE — Progress Notes (Signed)
[facility] Progress Note  Chief Complaint: Left lower quadrant pain and constipation  Subjective  History:  Jacques was sent for reevaluation by primary care with concerns of liver disease.  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  She had already been seen for biofeedback therapy.  Her regimen of MiraLAX and Linzess was not always helping to relieve her constipation.  She sent me an email in March, and I suggested as needed use of a Dulcolax suppository.  Necie saw a new primary care provider last month (I reviewed that note), and her thorough review of the patient's history and records noted a CT scan in early 2017 suggesting possible early liver cirrhosis.  Patient has a history of hepatitis C treated with Harvoni, with an undetectable posttreatment PCR in February 2017.   Masey continues to struggle with constipation, having a BM 2 or 3 times a week despite the above regimen.  She denies melena, hematemesis, nausea, vomiting, dysphagia or rectal bleeding.  ROS: Cardiovascular:  no chest pain Respiratory: no dyspnea She denies dysuria or hematuria. Her mood is up and down but functional status is good.  The patient's Past Medical, Family and Social History were reviewed and are on file in the EMR.  Objective:  Med list reviewed  Current Outpatient Medications:  .  albuterol (PROVENTIL HFA;VENTOLIN HFA) 108 (90 Base) MCG/ACT inhaler, Inhale 2 puffs into the lungs 4 (four) times daily., Disp: , Rfl:  .  bimatoprost (LUMIGAN) 0.01 % SOLN, Place 1 drop into both eyes at bedtime., Disp: , Rfl:  .  busPIRone (BUSPAR) 5 MG tablet, Take 1 tablet (5 mg total) by mouth 2 (two) times daily., Disp: , Rfl:  .  butalbital-acetaminophen-caffeine (FIORICET, ESGIC) 50-325-40 MG tablet, Take 1 tablet by mouth  every 6 (six) hours as needed., Disp: , Rfl: 3 .  cyclobenzaprine (FLEXERIL) 5 MG tablet, Take 1 tablet by mouth at bedtime., Disp: , Rfl: 3 .  hydrOXYzine (VISTARIL) 50 MG capsule, Take 50 mg by mouth at bedtime., Disp: , Rfl:  .  ipratropium-albuterol (DUONEB) 0.5-2.5 (3) MG/3ML SOLN, Inhale 3 mLs into the lungs See admin instructions., Disp: , Rfl: 5 .  linaclotide (LINZESS) 72 MCG capsule, Take 1 capsule (72 mcg total) by mouth daily before breakfast., Disp: 30 capsule, Rfl:  .  meloxicam (MOBIC) 15 MG tablet, Take 15 mg by mouth daily., Disp: , Rfl: 3 .  montelukast (SINGULAIR) 10 MG tablet, Take 1 tablet (10 mg total) by mouth daily., Disp: 90 tablet, Rfl: 3 .  omeprazole (PRILOSEC) 40 MG capsule, 1 CAPSULE BY MOUTH DAILY, Disp: , Rfl: 3 .  paliperidone (INVEGA) 6 MG 24 hr tablet, Take 6 mg by mouth every morning., Disp: , Rfl:  .  polyethylene glycol (MIRALAX / GLYCOLAX) packet, Take 17 g by mouth 2 (two) times daily., Disp: , Rfl:  .  prazosin (MINIPRESS) 1 MG capsule, Take 1 mg by mouth at bedtime., Disp: , Rfl:  .  PROAIR HFA 108 (90 Base) MCG/ACT inhaler, 1-2 PUFFS INHALE EVERY 4-6 HOURS AS NEEDED (PRN), Disp: , Rfl: 5 .  sertraline (ZOLOFT) 100 MG tablet, Take 200 mg by mouth every morning., Disp: , Rfl:  .  traMADol (ULTRAM) 50 MG tablet, Take 50 mg by mouth 3 (three) times daily., Disp: , Rfl:  .  traZODone (DESYREL) 50 MG tablet, 1 TABLET BY MOUTH AT BEDTIME, Disp: , Rfl: 5 .  VESICARE 10 MG tablet, Take 10 mg by mouth daily., Disp: , Rfl: 3   Vital signs in last 24 hrs: Vitals:   06/01/17 1055  BP: 120/72  Pulse: 88    Physical Exam  Pleasant and well-appearing, somewhat restricted affect  HEENT: sclera anicteric, oral mucosa moist without lesions  Neck: supple, no thyromegaly, JVD or lymphadenopathy  Cardiac: RRR without murmurs, S1S2 heard, no peripheral edema  Pulm: clear to auscultation bilaterally, normal RR and effort noted  Abdomen: soft, mild LLQ  tenderness, with active bowel sounds. No guarding or palpable hepatosplenomegaly (limited by body habitus)  Skin; warm and dry, no jaundice or rash.  No petechiae or purpura  Recent Labs:  CBC Latest Ref Rng & Units 05/09/2017 08/03/2016 07/31/2016  WBC 3.4 - 10.8 x10E3/uL 5.3 5.2 5.6  Hemoglobin 11.1 - 15.9 g/dL 11.8 12.1 12.8  Hematocrit 34.0 - 46.6 % 36.4 37.3 39.4  Platelets 150 - 379 x10E3/uL 179 208 201   CMP Latest Ref Rng & Units 05/09/2017 08/03/2016 07/31/2016  Glucose 65 - 99 mg/dL 86 116 87  BUN 8 - 27 mg/dL 10 11.0 12.9  Creatinine 0.57 - 1.00 mg/dL 0.70 0.9 0.9  Sodium 134 - 144 mmol/L 144 140 138  Potassium 3.5 - 5.2 mmol/L 4.5 4.3 3.7  Chloride 96 - 106 mmol/L 105 - -  CO2 20 - 29 mmol/L 24 27 24   Calcium 8.7 - 10.3 mg/dL 9.1 9.4 9.7  Total Protein 6.0 - 8.5 g/dL 7.9 7.9 8.2  Total Bilirubin 0.0 - 1.2 mg/dL <0.2 0.29 0.49  Alkaline Phos 39 - 117 IU/L 87 94 99  AST 0 - 40 IU/L 18 12 13   ALT 0 - 32 IU/L 13 9 11    Last INR normal in 2016  Radiologic studies:  CT abdomen and pelvis January 2017 reports hepatomegaly 20 cm length, enlargement of caudate lobe felt to be suggestive of "possible early cirrhosis". CT abdomen (1 of many done for follow-up of patient's lymphoma) reports that the liver is "within normal limits".  There is no ascites or mention of splenomegaly or varices.  @ASSESSMENTPLANBEGIN @ Assessment: Encounter Diagnoses  Name Primary?  . Dyssynergic constipation Yes  . Slow transit constipation   . Abdominal bloating   . History of hepatitis C    She has long-standing functional constipation and dyssynergic defecation.  She is on maximal medical therapy, and does not seem to have had much benefit from biofeedback. I gave her instructions for how to take a MiraLAX purge every 10 to 14 days as needed to give relief of abdominal pain constipation and bloating.  Her hepatitis C was treated and cleared.  It is difficult to know what to make of the nonspecific  description on a previous CT of the caudate lobe being enlarged, no mention of it was made on the most recent CT.  She has no clinical signs of cirrhosis at this point.  I would gladly reevaluate her as the need arises.    Total time 30 minutes, over half spent face-to-face with patient in counseling and coordination of care.  Extensive chart review performed.  Nelida Meuse III

## 2017-06-01 NOTE — Patient Instructions (Addendum)
If you are age 64 or older, your body mass index should be between 23-30. Your Body mass index is 34.79 kg/m. If this is out of the aforementioned range listed, please consider follow up with your Primary Care Provider.  If you are age 49 or younger, your body mass index should be between 19-25. Your Body mass index is 34.79 kg/m. If this is out of the aformentioned range listed, please consider follow up with your Primary Care Provider.   Dr Loletha Carrow recommends that you complete a bowel purge (to clean out your bowels). Please do the following: Purchase a bottle of Miralax over the counter as well as a box of 5 mg dulcolax tablets. Take 4 dulcolax tablets. Wait 1 hour. You will then drink 6-8 capfuls of Miralax mixed in an adequate amount of water/juice/gatorade (you may choose which of these liquids to drink) over the next 2-3 hours. You should expect results within 1 to 6 hours after completing the bowel purge.   It was a pleasure to see you today!  Dr. Loletha Carrow

## 2017-07-23 ENCOUNTER — Telehealth: Payer: Self-pay | Admitting: Adult Health

## 2017-07-23 NOTE — Telephone Encounter (Signed)
Patient called states she thought provider had referred her out to a pain specialist--advised provider notes tell her to see the Orthopedist she has been using in the past regarding past pain treatment--  advised I would also forward her request to the Pain Spec to medical assistant.  --forwarding to [facility] for review of prior OV notes for pt's request.  --glh

## 2017-07-24 NOTE — Telephone Encounter (Signed)
Pt states that she suffers from rheumatoid arthritis in her hands, back and knees.  Should the patient been seen by rheumatology or pain management?  Charyl Bigger, CMA

## 2017-07-24 NOTE — Telephone Encounter (Signed)
There is no mention in last OV notes that referral to Pain Management would be ordered.  Please advise.  Charyl Bigger, CMA

## 2017-07-24 NOTE — Telephone Encounter (Signed)
Good Morning Tonya, Can you please call Mr. Valere Dross and further clarify the need for pain referral I know that she is seen by Ortho/Dr. Noemi Chapel for back pain He last pain rx was tramadol 50mg - 100 count for 25 days supply filled- Jan 2019 Can you please ask what pain specifically she is having so we can substantiate the referral Thanks! Valetta Fuller

## 2017-07-25 ENCOUNTER — Other Ambulatory Visit: Payer: Self-pay | Admitting: Adult Health

## 2017-07-25 NOTE — Telephone Encounter (Signed)
Afternoon Rachael West, Can you please call Rachael West and let her know that she and I can discuss this further at her f/u 07/31/17 Thanks! Valetta Fuller

## 2017-07-25 NOTE — Telephone Encounter (Signed)
Pt informed.  Pt expressed understanding and is agreeable.  T. Iven Earnhart, CMA  

## 2017-07-31 ENCOUNTER — Ambulatory Visit (INDEPENDENT_AMBULATORY_CARE_PROVIDER_SITE_OTHER): Payer: Medicare Other | Admitting: Adult Health

## 2017-07-31 ENCOUNTER — Encounter: Payer: Self-pay | Admitting: Adult Health

## 2017-07-31 VITALS — BP 111/73 | HR 81 | Ht 65.0 in | Wt 207.0 lb

## 2017-07-31 DIAGNOSIS — M25551 Pain in right hip: Secondary | ICD-10-CM

## 2017-07-31 DIAGNOSIS — M25562 Pain in left knee: Secondary | ICD-10-CM

## 2017-07-31 DIAGNOSIS — F329 Major depressive disorder, single episode, unspecified: Secondary | ICD-10-CM

## 2017-07-31 DIAGNOSIS — I10 Essential (primary) hypertension: Secondary | ICD-10-CM

## 2017-07-31 DIAGNOSIS — E785 Hyperlipidemia, unspecified: Secondary | ICD-10-CM | POA: Insufficient documentation

## 2017-07-31 DIAGNOSIS — M25561 Pain in right knee: Secondary | ICD-10-CM

## 2017-07-31 DIAGNOSIS — E669 Obesity, unspecified: Secondary | ICD-10-CM

## 2017-07-31 DIAGNOSIS — M79641 Pain in right hand: Secondary | ICD-10-CM | POA: Diagnosis not present

## 2017-07-31 DIAGNOSIS — F32A Depression, unspecified: Secondary | ICD-10-CM

## 2017-07-31 DIAGNOSIS — M25552 Pain in left hip: Secondary | ICD-10-CM

## 2017-07-31 DIAGNOSIS — M546 Pain in thoracic spine: Secondary | ICD-10-CM

## 2017-07-31 DIAGNOSIS — Z0000 Encounter for general adult medical examination without abnormal findings: Secondary | ICD-10-CM

## 2017-07-31 DIAGNOSIS — C8510 Unspecified B-cell lymphoma, unspecified site: Secondary | ICD-10-CM

## 2017-07-31 DIAGNOSIS — G8929 Other chronic pain: Secondary | ICD-10-CM

## 2017-07-31 DIAGNOSIS — M79642 Pain in left hand: Secondary | ICD-10-CM

## 2017-07-31 MED ORDER — ALBUTEROL SULFATE HFA 108 (90 BASE) MCG/ACT IN AERS: 2.0000 | INHALATION_SPRAY | Freq: Four times a day (QID) | RESPIRATORY_TRACT | 5 refills | Status: DC

## 2017-07-31 MED ORDER — IPRATROPIUM-ALBUTEROL 0.5-2.5 (3) MG/3ML IN SOLN: 3.0000 mL | RESPIRATORY_TRACT | 5 refills | Status: DC

## 2017-07-31 NOTE — Addendum Note (Signed)
Addended by: Fonnie Mu on: 07/31/2017 02:44 PM   Modules accepted: Orders

## 2017-07-31 NOTE — Patient Instructions (Signed)
Mediterranean Diet A Mediterranean diet refers to food and lifestyle choices that are based on the traditions of countries located on the [place]. This way of eating has been shown to help prevent certain conditions and improve outcomes for people who have chronic diseases, like kidney disease and heart disease. What are tips for following this plan? Lifestyle  Cook and eat meals together with your family, when possible.  Drink enough fluid to keep your urine clear or pale yellow.  Be physically active every day. This includes: ? Aerobic exercise like running or swimming. ? Leisure activities like gardening, walking, or housework.  Get 7-8 hours of sleep each night.  If recommended by your health care provider, drink red wine in moderation. This means 1 glass a day for nonpregnant women and 2 glasses a day for men. A glass of wine equals 5 oz (150 mL). Reading food labels  Check the serving size of packaged foods. For foods such as rice and pasta, the serving size refers to the amount of cooked product, not dry.  Check the total fat in packaged foods. Avoid foods that have saturated fat or trans fats.  Check the ingredients list for added sugars, such as corn syrup. Shopping  At the grocery store, buy most of your food from the areas near the walls of the store. This includes: ? Fresh fruits and vegetables (produce). ? Grains, beans, nuts, and seeds. Some of these may be available in unpackaged forms or large amounts (in bulk). ? Fresh seafood. ? Poultry and eggs. ? Low-fat dairy products.  Buy whole ingredients instead of prepackaged foods.  Buy fresh fruits and vegetables in-season from local farmers markets.  Buy frozen fruits and vegetables in resealable bags.  If you do not have access to quality fresh seafood, buy precooked frozen shrimp or canned fish, such as tuna, salmon, or sardines.  Buy small amounts of raw or cooked vegetables, salads, or olives from the  deli or salad bar at your store.  Stock your pantry so you always have certain foods on hand, such as olive oil, canned tuna, canned tomatoes, rice, pasta, and beans. Cooking  Cook foods with extra-virgin olive oil instead of using butter or other vegetable oils.  Have meat as a side dish, and have vegetables or grains as your main dish. This means having meat in small portions or adding small amounts of meat to foods like pasta or stew.  Use beans or vegetables instead of meat in common dishes like chili or lasagna.  Experiment with different cooking methods. Try roasting or broiling vegetables instead of steaming or sauteing them.  Add frozen vegetables to soups, stews, pasta, or rice.  Add nuts or seeds for added healthy fat at each meal. You can add these to yogurt, salads, or vegetable dishes.  Marinate fish or vegetables using olive oil, lemon juice, garlic, and fresh herbs. Meal planning  Plan to eat 1 vegetarian meal one day each week. Try to work up to 2 vegetarian meals, if possible.  Eat seafood 2 or more times a week.  Have healthy snacks readily available, such as: ? Vegetable sticks with hummus. ? Greek yogurt. ? Fruit and nut trail mix.  Eat balanced meals throughout the week. This includes: ? Fruit: 2-3 servings a day ? Vegetables: 4-5 servings a day ? Low-fat dairy: 2 servings a day ? Fish, poultry, or lean meat: 1 serving a day ? Beans and legumes: 2 or more servings a week ? Nuts   and seeds: 1-2 servings a day ? Whole grains: 6-8 servings a day ? Extra-virgin olive oil: 3-4 servings a day  Limit red meat and sweets to only a few servings a month What are my food choices?  Mediterranean diet ? Recommended ? Grains: Whole-grain pasta. Brown rice. Bulgar wheat. Polenta. Couscous. Whole-wheat bread. [place]. ? Vegetables: Artichokes. Beets. Broccoli. Cabbage. Carrots. Eggplant. Green beans. Chard. Kale. Spinach. Onions. Leeks. Peas. Squash.  Tomatoes. Peppers. Radishes. ? Fruits: Apples. Apricots. Avocado. Berries. Bananas. Cherries. Dates. Figs. Grapes. Lemons. Melon. Oranges. Peaches. Plums. Pomegranate. ? Meats and other protein foods: Beans. Almonds. Sunflower seeds. Pine nuts. Peanuts. [place]. Salmon. Scallops. Shrimp. [place]. Tilapia. Clams. Oysters. Eggs. ? Dairy: Low-fat milk. Cheese. Greek yogurt. ? Beverages: Water. Red wine. Herbal tea. ? Fats and oils: Extra virgin olive oil. Avocado oil. Grape seed oil. ? Sweets and desserts: [place] yogurt with honey. Baked apples. Poached pears. Trail mix. ? Seasoning and other foods: Basil. Cilantro. Coriander. Cumin. Mint. Parsley. Sage. Rosemary. Tarragon. Garlic. Oregano. Thyme. Pepper. Balsalmic vinegar. Tahini. Hummus. Tomato sauce. Olives. Mushrooms. ? Limit these ? Grains: Prepackaged pasta or rice dishes. Prepackaged cereal with added sugar. ? Vegetables: Deep fried potatoes (french fries). ? Fruits: Fruit canned in syrup. ? Meats and other protein foods: Beef. Pork. Lamb. Poultry with skin. Hot dogs. [place] Salines. ? Dairy: Ice cream. Sour cream. Whole milk. ? Beverages: Juice. Sugar-sweetened soft drinks. Beer. Liquor and spirits. ? Fats and oils: Butter. Canola oil. Vegetable oil. Beef fat (tallow). Lard. ? Sweets and desserts: Cookies. Cakes. Pies. Candy. ? Seasoning and other foods: Mayonnaise. Premade sauces and marinades. ? The items listed may not be a complete list. Talk with your dietitian about what dietary choices are right for you. Summary  The Mediterranean diet includes both food and lifestyle choices.  Eat a variety of fresh fruits and vegetables, beans, nuts, seeds, and whole grains.  Limit the amount of red meat and sweets that you eat.  Talk with your health care provider about whether it is safe for you to drink red wine in moderation. This means 1 glass a day for nonpregnant women and 2 glasses a day for men. A glass of wine equals 5 oz (150 mL). This information  is not intended to replace advice given to you by your health care provider. Make sure you discuss any questions you have with your health care provider. Document Released: 08/19/2015 Document Revised: 09/21/2015 Document Reviewed: 08/19/2015 Elsevier Interactive Patient Education  2018 [place] is a term that is commonly used to refer to joint pain or joint disease. There are more than 100 types of arthritis. What are the causes? The most common cause of this condition is wear and tear of a joint. Other causes include:  Gout.  Inflammation of a joint.  An infection of a joint.  Sprains and other injuries near the joint.  A drug reaction or allergic reaction.  In some cases, the cause may not be known. What are the signs or symptoms? The main symptom of this condition is pain in the joint with movement. Other symptoms include:  Redness, swelling, or stiffness at a joint.  Warmth coming from the joint.  Fever.  Overall feeling of illness.  How is this diagnosed? This condition may be diagnosed with a physical exam and tests, including:  Blood tests.  Urine tests.  Imaging tests, such as MRI, X-rays, or a CT scan.  Sometimes, fluid is removed from a joint  for testing. How is this treated? Treatment for this condition may involve:  Treatment of the cause, if it is known.  Rest.  Raising (elevating) the joint.  Applying cold or hot packs to the joint.  Medicines to improve symptoms and reduce inflammation.  Injections of a steroid such as cortisone into the joint to help reduce pain and inflammation.  Depending on the cause of your arthritis, you may need to make lifestyle changes to reduce stress on your joint. These changes may include exercising more and losing weight. Follow these instructions at home: Medicines  Take over-the-counter and prescription medicines only as told by your health care provider.  Do not take aspirin to  relieve pain if gout is suspected. Activity  Rest your joint if told by your health care provider. Rest is important when your disease is active and your joint feels painful, swollen, or stiff.  Avoid activities that make the pain worse. It is important to balance activity with rest.  Exercise your joint regularly with range-of-motion exercises as told by your health care provider. Try doing low-impact exercise, such as: ? Swimming. ? Water aerobics. ? Biking. ? Walking. Joint Care   If your joint is swollen, keep it elevated if told by your health care provider.  If your joint feels stiff in the morning, try taking a warm shower.  If directed, apply heat to the joint. If you have diabetes, do not apply heat without permission from your health care provider. ? Put a towel between the joint and the hot pack or heating pad. ? Leave the heat on the area for 20-30 minutes.  If directed, apply ice to the joint: ? Put ice in a plastic bag. ? Place a towel between your skin and the bag. ? Leave the ice on for 20 minutes, 2-3 times per day.  Keep all follow-up visits as told by your health care provider. This is important. Contact a health care provider if:  The pain gets worse.  You have a fever. Get help right away if:  You develop severe joint pain, swelling, or redness.  Many joints become painful and swollen.  You develop severe back pain.  You develop severe weakness in your leg.  You cannot control your bladder or bowels. This information is not intended to replace advice given to you by your health care provider. Make sure you discuss any questions you have with your health care provider. Document Released: 02/03/2004 Document Revised: 06/03/2015 Document Reviewed: 03/23/2014 Elsevier Interactive Patient Education  2018 [place] all medications as directed. Increase plain water intake and follow Mediterranean diet. Continue to stretch daily and walk  as often as possible. Continue with Monarch as directed. Please keep your Oncology appt 08/06/17 Please call [facility]- contact information provided. Referral to Pain Clinic placed. Follow-up with Gastroenterologist as needed, again recommend to increase water intake, increase fruits/vegetables, reduce cheese intake. Follow up in 4 months for complete physical with fasting labs. NICE TO SEE YOU!

## 2017-07-31 NOTE — Assessment & Plan Note (Signed)
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 Scans once a year until 5 years Next Oncology appt 08/06/17

## 2017-07-31 NOTE — Assessment & Plan Note (Addendum)
Body mass index is 34.45 kg/m.  Current wt 207 Mediterranean diet, increase regular movement/walking

## 2017-07-31 NOTE — Assessment & Plan Note (Signed)
Mood stable Managed by Beverly Sessions- weekly PTSD group sessions and psychiatry Q3M She denies thoughts of harming herself/others

## 2017-07-31 NOTE — Assessment & Plan Note (Signed)
The 10-year ASCVD risk score Mikey Bussing DC Brooke Bonito., et al., 2013) is: 6.1%   Values used to calculate the score:     Age: 62 years     Sex: Female     Is Non-Hispanic African American: Yes     Diabetic: No     Tobacco smoker: No     Systolic Blood Pressure: 034 mmHg     Is BP treated: Yes     HDL Cholesterol: 46 mg/dL     Total Cholesterol: 201 mg/dL LDL-132 Trying TLC to normalize numbers, will re-check at CPE in Nov 2019

## 2017-07-31 NOTE — Assessment & Plan Note (Signed)
  Continue all medications as directed. Increase plain water intake and follow Mediterranean diet. Continue to stretch daily and walk as often as possible. Continue with Monarch as directed. Please keep your Oncology appt 08/06/17 Please call [facility] Rheumatology- contact information provided. Referral to Pain Clinic placed. Follow-up with Gastroenterologist as needed, again recommend to increase water intake, increase fruits/vegetables, reduce cheese intake. Follow up in 4 months for complete physical with fasting labs.

## 2017-07-31 NOTE — Assessment & Plan Note (Addendum)
BP at goal 111/73, HR 81 Continue Lisinopril 5mg  QD Continue Atenolol 25mg  QD
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
## 2017-08-03 ENCOUNTER — Other Ambulatory Visit: Payer: Self-pay

## 2017-08-03 DIAGNOSIS — C8510 Unspecified B-cell lymphoma, unspecified site: Secondary | ICD-10-CM

## 2017-08-03 DIAGNOSIS — B182 Chronic viral hepatitis C: Secondary | ICD-10-CM

## 2017-08-06 ENCOUNTER — Telehealth: Payer: Self-pay | Admitting: Hematology and Oncology

## 2017-08-06 ENCOUNTER — Inpatient Hospital Stay: Payer: Medicare Other | Attending: Hematology and Oncology | Admitting: Hematology and Oncology

## 2017-08-06 ENCOUNTER — Inpatient Hospital Stay: Payer: Medicare Other

## 2017-08-06 DIAGNOSIS — Z9221 Personal history of antineoplastic chemotherapy: Secondary | ICD-10-CM | POA: Insufficient documentation

## 2017-08-06 DIAGNOSIS — C8510 Unspecified B-cell lymphoma, unspecified site: Secondary | ICD-10-CM

## 2017-08-06 DIAGNOSIS — R5383 Other fatigue: Secondary | ICD-10-CM | POA: Diagnosis not present

## 2017-08-06 DIAGNOSIS — C8334 Diffuse large B-cell lymphoma, lymph nodes of axilla and upper limb: Secondary | ICD-10-CM | POA: Diagnosis not present

## 2017-08-06 DIAGNOSIS — Z79899 Other long term (current) drug therapy: Secondary | ICD-10-CM | POA: Diagnosis not present

## 2017-08-06 DIAGNOSIS — B182 Chronic viral hepatitis C: Secondary | ICD-10-CM | POA: Insufficient documentation

## 2017-08-06 LAB — CBC WITH DIFFERENTIAL (CANCER CENTER ONLY)
BASOS PCT: 1 %
Basophils Absolute: 0 10*3/uL (ref 0.0–0.1)
EOS ABS: 0.1 10*3/uL (ref 0.0–0.5)
Eosinophils Relative: 3 %
HEMATOCRIT: 34.5 % — AB (ref 34.8–46.6)
HEMOGLOBIN: 11.2 g/dL — AB (ref 11.6–15.9)
Lymphocytes Relative: 48 %
Lymphs Abs: 2.2 10*3/uL (ref 0.9–3.3)
MCH: 25.6 pg (ref 25.1–34.0)
MCHC: 32.4 g/dL (ref 31.5–36.0)
MCV: 78.9 fL — ABNORMAL LOW (ref 79.5–101.0)
Monocytes Absolute: 0.4 10*3/uL (ref 0.1–0.9)
Monocytes Relative: 8 %
NEUTROS ABS: 1.9 10*3/uL (ref 1.5–6.5)
NEUTROS PCT: 40 %
Platelet Count: 192 10*3/uL (ref 145–400)
RBC: 4.37 MIL/uL (ref 3.70–5.45)
RDW: 14 % (ref 11.2–14.5)
WBC: 4.7 10*3/uL (ref 3.9–10.3)

## 2017-08-06 LAB — CMP (CANCER CENTER ONLY)
ALK PHOS: 81 U/L (ref 38–126)
ALT: 20 U/L (ref 0–44)
ANION GAP: 6 (ref 5–15)
AST: 21 U/L (ref 15–41)
Albumin: 3.4 g/dL — ABNORMAL LOW (ref 3.5–5.0)
BUN: 10 mg/dL (ref 8–23)
CALCIUM: 9.2 mg/dL (ref 8.9–10.3)
CO2: 27 mmol/L (ref 22–32)
CREATININE: 0.86 mg/dL (ref 0.44–1.00)
Chloride: 107 mmol/L (ref 98–111)
GFR, Estimated: >60 mL/min (ref >60)
Glucose, Bld: 92 mg/dL (ref 70–99)
Potassium: 4.1 mmol/L (ref 3.5–5.1)
SODIUM: 140 mmol/L (ref 135–145)
Total Protein: 7.2 g/dL (ref 6.5–8.1)

## 2017-08-06 LAB — LACTATE DEHYDROGENASE: LDH: 128 U/L (ref 98–192)

## 2017-08-06 NOTE — Assessment & Plan Note (Signed)
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  We will order the scans and follow-up after that.  Chronic hepatitis C:Liver function tests are adequate, CT abdomen suggests mild cirrhosis. Patient seesgastroenterology.  Return to clinic in 1 year for follow-up with labs and CT scans. Scans once a year until 5 years

## 2017-08-06 NOTE — Progress Notes (Signed)
 Patient Care Team: Danford, [name] D, NP as PCP - General (Family Medicine) Edwards, James, MD as Consulting Physician (Gastroenterology) Danis, Henry L III, MD as Consulting Physician (Gastroenterology) Wainer, Robert, MD as Consulting Physician (Orthopedic Surgery)  DIAGNOSIS:  Encounter Diagnosis  Name Primary?  . Large B-cell lymphoma (HCC)     SUMMARY OF ONCOLOGIC HISTORY:   Large B-cell lymphoma (HCC)   01/05/2014 Initial Diagnosis    Left axillary lymph node biopsy: Aggressive large B-cell lymphoma high-grade without CD10 or CD5 expression, positive for CD20, BCL-2 and BCL 6 and Ki-67 of 100% (DD: DLBCL vs B cell lymphoma unclassified)      01/16/2014 - 05/01/2014 Chemotherapy    R CHOP chemotherapy 6      01/21/2014 PET scan    PET CT scan done after cycle one RCHOP (urgent treatment needed) hypermetabolic bulky left supraclavicular and axillary lymphadenopathy. PET activity in the bone marrow and spleen thought to be related to chemotherapy rather than disease. (Stage 2B)      05/29/2014 PET scan    Significant improvement, with reduction in size and activity of the left supraclavicular and left axillary lymph node ; reduced activity in the normal size spleen; and resolution of the diffuse hypermetabolic marrow activity.      02/02/2015 Imaging    Borderline left axillary lymph node same size, no abdominal or pelvic lymphadenopathy, hepatomegaly mild cirrhosis , improving right middle lobe airspace disease. No evidence of lymphoma      07/31/2016 Imaging    CT CAP; no findings of lymphoma, index left axillary lymph node 9 mm within normal limits       CHIEF COMPLIANT: Follow-up of diffuse large B-cell lymphoma  INTERVAL HISTORY: Rachael West is a 61-year-old with above-mentioned history of diffuse large B cell lymphoma who is in complete remission.  She is here for annual follow-up.  She reports profound fatigue.  Other than that she denies any fevers chills  night sweats or weight loss.  REVIEW OF SYSTEMS:   Constitutional: Denies fevers, chills or abnormal weight loss Eyes: Denies blurriness of vision Ears, nose, mouth, throat, and face: Denies mucositis or sore throat Respiratory: Denies cough, dyspnea or wheezes Cardiovascular: Denies palpitation, chest discomfort Gastrointestinal:  Denies nausea, heartburn or change in bowel habits Skin: Denies abnormal skin rashes Lymphatics: Denies new lymphadenopathy or easy bruising Neurological:Denies numbness, tingling or new weaknesses Behavioral/Psych: Mood is stable, no new changes  Extremities: No lower extremity edema   All other systems were reviewed with the patient and are negative.  I have reviewed the past medical history, past surgical history, social history and family history with the patient and they are unchanged from previous note.  ALLERGIES:  is allergic to latex; penicillins; codeine; cortisone; and sulfa antibiotics.  MEDICATIONS:  Current Outpatient Medications  Medication Sig Dispense Refill  . albuterol (PROVENTIL HFA;VENTOLIN HFA) 108 (90 Base) MCG/ACT inhaler Inhale 2 puffs into the lungs 4 (four) times daily. 1 Inhaler 5  . atenolol (TENORMIN) 25 MG tablet Take 25 mg by mouth daily.    . bimatoprost (LUMIGAN) 0.01 % SOLN Place 1 drop into both eyes at bedtime.    . busPIRone (BUSPAR) 5 MG tablet Take 1 tablet (5 mg total) by mouth 2 (two) times daily.    . butalbital-acetaminophen-caffeine (FIORICET, ESGIC) 50-325-40 MG tablet Take 1 tablet by mouth every 6 (six) hours as needed.  3  . cyclobenzaprine (FLEXERIL) 5 MG tablet Take 1 tablet by mouth at bedtime.    3  . hydrOXYzine (VISTARIL) 50 MG capsule Take 50 mg by mouth at bedtime.    . ipratropium-albuterol (DUONEB) 0.5-2.5 (3) MG/3ML SOLN Inhale 3 mLs into the lungs See admin instructions. 360 mL 5  . linaclotide (LINZESS) 72 MCG capsule Take 1 capsule (72 mcg total) by mouth daily before breakfast. 30 capsule   .  lisinopril (PRINIVIL,ZESTRIL) 5 MG tablet Take 5 mg by mouth daily.    . meloxicam (MOBIC) 15 MG tablet Take 15 mg by mouth daily.  3  . montelukast (SINGULAIR) 10 MG tablet Take 1 tablet (10 mg total) by mouth daily. 90 tablet 3  . omeprazole (PRILOSEC) 40 MG capsule 1 CAPSULE BY MOUTH DAILY  3  . paliperidone (INVEGA) 6 MG 24 hr tablet Take 6 mg by mouth every morning.    . polyethylene glycol (MIRALAX / GLYCOLAX) packet Take 17 g by mouth 2 (two) times daily.    . prazosin (MINIPRESS) 1 MG capsule Take 1 mg by mouth at bedtime.    . sertraline (ZOLOFT) 100 MG tablet Take 200 mg by mouth every morning.    . traMADol (ULTRAM) 50 MG tablet Take 50 mg by mouth 3 (three) times daily.    . traZODone (DESYREL) 50 MG tablet 1 TABLET BY MOUTH AT BEDTIME  5  . VESICARE 10 MG tablet Take 10 mg by mouth daily.  3   No current facility-administered medications for this visit.     PHYSICAL EXAMINATION: ECOG PERFORMANCE STATUS: 1 - Symptomatic but completely ambulatory  Vitals:   08/06/17 0912  BP: 104/63  Pulse: 80  Resp: 16  Temp: 98.6 F (37 C)  SpO2: 98%   Filed Weights   08/06/17 0912  Weight: 209 lb 8 oz (95 kg)    GENERAL:alert, no distress and comfortable SKIN: skin color, texture, turgor are normal, no rashes or significant lesions EYES: normal, Conjunctiva are pink and non-injected, sclera clear OROPHARYNX:no exudate, no erythema and lips, buccal mucosa, and tongue normal  NECK: supple, thyroid normal size, non-tender, without nodularity LYMPH:  no palpable lymphadenopathy in the cervical, axillary or inguinal LUNGS: clear to auscultation and percussion with normal breathing effort HEART: regular rate & rhythm and no murmurs and no lower extremity edema ABDOMEN:abdomen soft, non-tender and normal bowel sounds MUSCULOSKELETAL:no cyanosis of digits and no clubbing  NEURO: alert & oriented x 3 with fluent speech, no focal motor/sensory deficits EXTREMITIES: No lower extremity  edema   LABORATORY DATA:  I have reviewed the data as listed CMP Latest Ref Rng & Units 08/06/2017 05/09/2017 08/03/2016  Glucose 70 - 99 mg/dL 92 86 116  BUN 8 - 23 mg/dL 10 10 11.0  Creatinine 0.44 - 1.00 mg/dL 0.86 0.70 0.9  Sodium 135 - 145 mmol/L 140 144 140  Potassium 3.5 - 5.1 mmol/L 4.1 4.5 4.3  Chloride 98 - 111 mmol/L 107 105 -  CO2 22 - 32 mmol/L 27 24 27  Calcium 8.9 - 10.3 mg/dL 9.2 9.1 9.4  Total Protein 6.5 - 8.1 g/dL 7.2 7.9 7.9  Total Bilirubin 0.3 - 1.2 mg/dL <0.2(L) <0.2 0.29  Alkaline Phos 38 - 126 U/L 81 87 94  AST 15 - 41 U/L 21 18 12  ALT 0 - 44 U/L 20 13 9    Lab Results  Component Value Date   WBC 4.7 08/06/2017   HGB 11.2 (L) 08/06/2017   HCT 34.5 (L) 08/06/2017   MCV 78.9 (L) 08/06/2017   PLT 192 08/06/2017   NEUTROABS   1.9 08/06/2017    ASSESSMENT & PLAN:  Large B-cell lymphoma 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  We will order the scans and follow-up after that.  I will call her with results of these tests.  Chronic hepatitis C:Liver function tests are adequate, CT abdomen suggests mild cirrhosis. Patient seesgastroenterology. Severe fatigue: No connection to lymphoma. We will await the results of the scans.  Return to clinic in 1 year for follow-up with labs and CT scans. Scans once a year until 5 years   No orders of the defined types were placed in this encounter.  The patient has a good understanding of the overall plan. she agrees with it. she will call with any problems that  may develop before the next visit here.   Harriette Ohara, MD 08/06/17

## 2017-08-06 NOTE — Telephone Encounter (Signed)
Gave patient avs and calendar of upcoming appts.  °

## 2017-08-14 ENCOUNTER — Telehealth: Payer: Self-pay | Admitting: Hematology and Oncology

## 2017-08-14 ENCOUNTER — Other Ambulatory Visit: Payer: Self-pay | Admitting: Adult Health

## 2017-08-14 ENCOUNTER — Ambulatory Visit (HOSPITAL_COMMUNITY)
Admission: RE | Admit: 2017-08-14 | Discharge: 2017-08-14 | Disposition: A | Discharge status: Home / Self Care | Payer: Medicare Other | Source: Ambulatory Visit | Attending: Hematology and Oncology | Admitting: Hematology and Oncology

## 2017-08-14 DIAGNOSIS — I700 Atherosclerosis of aorta: Secondary | ICD-10-CM | POA: Insufficient documentation

## 2017-08-14 DIAGNOSIS — C8510 Unspecified B-cell lymphoma, unspecified site: Secondary | ICD-10-CM

## 2017-08-14 DIAGNOSIS — R911 Solitary pulmonary nodule: Secondary | ICD-10-CM | POA: Insufficient documentation

## 2017-08-14 DIAGNOSIS — Z1231 Encounter for screening mammogram for malignant neoplasm of breast: Secondary | ICD-10-CM

## 2017-08-14 MED ORDER — IOHEXOL 300 MG/ML  SOLN
100.0000 mL | Freq: Once | INTRAMUSCULAR | Status: AC | PRN
Start: 2017-08-14 — End: 2017-08-14
  Administered 2017-08-14: 100 mL via INTRAVENOUS

## 2017-08-14 NOTE — Telephone Encounter (Signed)
I informed the patient that the CT scans did not show any evidence of recurrent lymphoma.  Lung nodules as previously seen

## 2017-08-17 ENCOUNTER — Telehealth (HOSPITAL_COMMUNITY): Payer: Self-pay

## 2017-08-17 NOTE — Telephone Encounter (Signed)
Called patient she said that she has insurance, I advised [facility] to [facility]

## 2017-08-29 ENCOUNTER — Ambulatory Visit
Admission: RE | Admit: 2017-08-29 | Discharge: 2017-08-29 | Disposition: A | Discharge status: Home / Self Care | Payer: Medicare Other | Source: Ambulatory Visit | Attending: Nurse Practitioner | Admitting: Nurse Practitioner

## 2017-08-29 ENCOUNTER — Ambulatory Visit: Payer: Medicare Other | Attending: Nurse Practitioner | Admitting: Nurse Practitioner

## 2017-08-29 ENCOUNTER — Encounter: Payer: Self-pay | Admitting: Nurse Practitioner

## 2017-08-29 VITALS — BP 144/75 | HR 73 | Temp 98.5°F | Resp 16 | Ht 65.0 in | Wt 207.0 lb

## 2017-08-29 DIAGNOSIS — M549 Dorsalgia, unspecified: Secondary | ICD-10-CM | POA: Diagnosis not present

## 2017-08-29 DIAGNOSIS — N3946 Mixed incontinence: Secondary | ICD-10-CM | POA: Insufficient documentation

## 2017-08-29 DIAGNOSIS — M533 Sacrococcygeal disorders, not elsewhere classified: Secondary | ICD-10-CM | POA: Diagnosis not present

## 2017-08-29 DIAGNOSIS — F329 Major depressive disorder, single episode, unspecified: Secondary | ICD-10-CM | POA: Diagnosis not present

## 2017-08-29 DIAGNOSIS — G894 Chronic pain syndrome: Secondary | ICD-10-CM | POA: Diagnosis not present

## 2017-08-29 DIAGNOSIS — Z91040 Latex allergy status: Secondary | ICD-10-CM | POA: Insufficient documentation

## 2017-08-29 DIAGNOSIS — Z825 Family history of asthma and other chronic lower respiratory diseases: Secondary | ICD-10-CM | POA: Insufficient documentation

## 2017-08-29 DIAGNOSIS — G4700 Insomnia, unspecified: Secondary | ICD-10-CM | POA: Insufficient documentation

## 2017-08-29 DIAGNOSIS — Z791 Long term (current) use of non-steroidal anti-inflammatories (NSAID): Secondary | ICD-10-CM | POA: Diagnosis not present

## 2017-08-29 DIAGNOSIS — M25562 Pain in left knee: Secondary | ICD-10-CM | POA: Insufficient documentation

## 2017-08-29 DIAGNOSIS — E559 Vitamin D deficiency, unspecified: Secondary | ICD-10-CM | POA: Diagnosis not present

## 2017-08-29 DIAGNOSIS — M47816 Spondylosis without myelopathy or radiculopathy, lumbar region: Secondary | ICD-10-CM | POA: Diagnosis not present

## 2017-08-29 DIAGNOSIS — M25561 Pain in right knee: Secondary | ICD-10-CM | POA: Diagnosis not present

## 2017-08-29 DIAGNOSIS — G8929 Other chronic pain: Secondary | ICD-10-CM | POA: Insufficient documentation

## 2017-08-29 DIAGNOSIS — M542 Cervicalgia: Secondary | ICD-10-CM | POA: Diagnosis not present

## 2017-08-29 DIAGNOSIS — Z6834 Body mass index (BMI) 34.0-34.9, adult: Secondary | ICD-10-CM | POA: Insufficient documentation

## 2017-08-29 DIAGNOSIS — Z8619 Personal history of other infectious and parasitic diseases: Secondary | ICD-10-CM | POA: Diagnosis not present

## 2017-08-29 DIAGNOSIS — Z880 Allergy status to penicillin: Secondary | ICD-10-CM | POA: Insufficient documentation

## 2017-08-29 DIAGNOSIS — M25511 Pain in right shoulder: Secondary | ICD-10-CM | POA: Insufficient documentation

## 2017-08-29 DIAGNOSIS — Z79891 Long term (current) use of opiate analgesic: Secondary | ICD-10-CM

## 2017-08-29 DIAGNOSIS — M5442 Lumbago with sciatica, left side: Principal | ICD-10-CM

## 2017-08-29 DIAGNOSIS — I10 Essential (primary) hypertension: Secondary | ICD-10-CM | POA: Diagnosis not present

## 2017-08-29 DIAGNOSIS — E079 Disorder of thyroid, unspecified: Secondary | ICD-10-CM | POA: Insufficient documentation

## 2017-08-29 DIAGNOSIS — J45909 Unspecified asthma, uncomplicated: Secondary | ICD-10-CM | POA: Diagnosis not present

## 2017-08-29 DIAGNOSIS — K7460 Unspecified cirrhosis of liver: Secondary | ICD-10-CM | POA: Insufficient documentation

## 2017-08-29 DIAGNOSIS — Z87891 Personal history of nicotine dependence: Secondary | ICD-10-CM | POA: Insufficient documentation

## 2017-08-29 DIAGNOSIS — M545 Low back pain: Secondary | ICD-10-CM | POA: Diagnosis present

## 2017-08-29 DIAGNOSIS — F419 Anxiety disorder, unspecified: Secondary | ICD-10-CM | POA: Insufficient documentation

## 2017-08-29 DIAGNOSIS — Z882 Allergy status to sulfonamides status: Secondary | ICD-10-CM | POA: Insufficient documentation

## 2017-08-29 DIAGNOSIS — Z9221 Personal history of antineoplastic chemotherapy: Secondary | ICD-10-CM | POA: Diagnosis not present

## 2017-08-29 DIAGNOSIS — Z789 Other specified health status: Secondary | ICD-10-CM

## 2017-08-29 DIAGNOSIS — E785 Hyperlipidemia, unspecified: Secondary | ICD-10-CM | POA: Insufficient documentation

## 2017-08-29 DIAGNOSIS — M79605 Pain in left leg: Secondary | ICD-10-CM | POA: Insufficient documentation

## 2017-08-29 DIAGNOSIS — R51 Headache: Secondary | ICD-10-CM | POA: Insufficient documentation

## 2017-08-29 DIAGNOSIS — Z8572 Personal history of non-Hodgkin lymphomas: Secondary | ICD-10-CM | POA: Insufficient documentation

## 2017-08-29 DIAGNOSIS — K219 Gastro-esophageal reflux disease without esophagitis: Secondary | ICD-10-CM | POA: Insufficient documentation

## 2017-08-29 DIAGNOSIS — Z79899 Other long term (current) drug therapy: Secondary | ICD-10-CM | POA: Diagnosis not present

## 2017-08-29 DIAGNOSIS — R519 Headache, unspecified: Secondary | ICD-10-CM

## 2017-08-29 DIAGNOSIS — Z8249 Family history of ischemic heart disease and other diseases of the circulatory system: Secondary | ICD-10-CM | POA: Insufficient documentation

## 2017-08-29 DIAGNOSIS — E669 Obesity, unspecified: Secondary | ICD-10-CM | POA: Insufficient documentation

## 2017-08-29 DIAGNOSIS — M899 Disorder of bone, unspecified: Secondary | ICD-10-CM

## 2017-08-29 DIAGNOSIS — Z885 Allergy status to narcotic agent status: Secondary | ICD-10-CM | POA: Insufficient documentation

## 2017-08-29 DIAGNOSIS — Z888 Allergy status to other drugs, medicaments and biological substances status: Secondary | ICD-10-CM | POA: Insufficient documentation

## 2017-08-29 NOTE — Patient Instructions (Addendum)
____________________________________________________________________________________________  Appointment Policy Summary  It is our goal and responsibility to provide the medical community with assistance in the evaluation and management of patients with chronic pain. Unfortunately our resources are limited. Because we do not have an unlimited amount of time, or available appointments, we are required to closely monitor and manage their use. The following rules exist to maximize their use:  Patient's responsibilities: 1. Punctuality:  At what time should I arrive? You should be physically present in our office 30 minutes before your scheduled appointment. Your scheduled appointment is with your assigned healthcare provider. However, it takes 5-10 minutes to be "checked-in", and another 15 minutes for the nurses to do the admission. If you arrive to our office at the time you were given for your appointment, you will end up being at least 20-25 minutes late to your appointment with the provider. 2. Tardiness:  What happens if I arrive only a few minutes after my scheduled appointment time? You will need to reschedule your appointment. The cutoff is your appointment time. This is why it is so important that you arrive at least 30 minutes before that appointment. If you have an appointment scheduled for 10:00 AM and you arrive at 10:01, you will be required to reschedule your appointment.  3. Plan ahead:  Always assume that you will encounter traffic on your way in. Plan for it. If you are dependent on a driver, make sure they understand these rules and the need to arrive early. 4. Other appointments and responsibilities:  Avoid scheduling any other appointments before or after your pain clinic appointments.  5. Be prepared:  Write down everything that you need to discuss with your healthcare provider and give this information to the admitting nurse. Write down the medications that you will need  refilled. Bring your pills and bottles (even the empty ones), to all of your appointments, except for those where a procedure is scheduled. 6. No children or pets:  Find someone to take care of them. It is not appropriate to bring them in. 7. Scheduling changes:  We request "advanced notification" of any changes or cancellations. 8. Advanced notification:  Defined as a time period of more than 24 hours prior to the originally scheduled appointment. This allows for the appointment to be offered to other patients. 9. Rescheduling:  When a visit is rescheduled, it will require the cancellation of the original appointment. For this reason they both fall within the category of "Cancellations".  10. Cancellations:  They require advanced notification. Any cancellation less than 24 hours before the  appointment will be recorded as a "No Show". 11. No Show:  Defined as an unkept appointment where the patient failed to notify or declare to the practice their intention or inability to keep the appointment.  Corrective process for repeat offenders:  1. Tardiness: Three (3) episodes of rescheduling due to late arrivals will be recorded as one (1) "No Show". 2. Cancellation or reschedule: Three (3) cancellations or rescheduling will be recorded as one (1) "No Show". 3. "No Shows": Three (3) "No Shows" within a 12 month period will result in discharge from the practice. ____________________________________________________________________________________________  ____________________________________________________________________________________________  Pain Scale  Introduction: The pain score used by this practice is the Verbal Numerical Rating Scale (VNRS-11). This is an 11-point scale. It is for adults and children 10 years or older. There are significant differences in how the pain score is reported, used, and applied. Forget everything you learned in the past and learn  this scoring system.  General  Information: The scale should reflect your current level of pain. Unless you are specifically asked for the level of your worst pain, or your average pain. If you are asked for one of these two, then it should be understood that it is over the past 24 hours.  Basic Activities of Daily Living (ADL): Personal hygiene, dressing, eating, transferring, and using restroom.  Instructions: Most patients tend to report their level of pain as a combination of two factors, their physical pain and their psychosocial pain. This last one is also known as "suffering" and it is reflection of how physical pain affects you socially and psychologically. From now on, report them separately. From this point on, when asked to report your pain level, report only your physical pain. Use the following table for reference.  Pain Clinic Pain Levels (0-5/10)  Pain Level Score  Description  No Pain 0   Mild pain 1 Nagging, annoying, but does not interfere with basic activities of daily living (ADL). Patients are able to eat, bathe, get dressed, toileting (being able to get on and off the toilet and perform personal hygiene functions), transfer (move in and out of bed or a chair without assistance), and maintain continence (able to control bladder and bowel functions). Blood pressure and heart rate are unaffected. A normal heart rate for a healthy adult ranges from 60 to 100 bpm (beats per minute).   Mild to moderate pain 2 Noticeable and distracting. Impossible to hide from other people. More frequent flare-ups. Still possible to adapt and function close to normal. It can be very annoying and may have occasional stronger flare-ups. With discipline, patients may get used to it and adapt.   Moderate pain 3 Interferes significantly with activities of daily living (ADL). It becomes difficult to feed, bathe, get dressed, get on and off the toilet or to perform personal hygiene functions. Difficult to get in and out of bed or a chair  without assistance. Very distracting. With effort, it can be ignored when deeply involved in activities.   Moderately severe pain 4 Impossible to ignore for more than a few minutes. With effort, patients may still be able to manage work or participate in some social activities. Very difficult to concentrate. Signs of autonomic nervous system discharge are evident: dilated pupils (mydriasis); mild sweating (diaphoresis); sleep interference. Heart rate becomes elevated (>115 bpm). Diastolic blood pressure (lower number) rises above 100 mmHg. Patients find relief in laying down and not moving.   Severe pain 5 Intense and extremely unpleasant. Associated with frowning face and frequent crying. Pain overwhelms the senses.  Ability to do any activity or maintain social relationships becomes significantly limited. Conversation becomes difficult. Pacing back and forth is common, as getting into a comfortable position is nearly impossible. Pain wakes you up from deep sleep. Physical signs will be obvious: pupillary dilation; increased sweating; goosebumps; brisk reflexes; cold, clammy hands and feet; nausea, vomiting or dry heaves; loss of appetite; significant sleep disturbance with inability to fall asleep or to remain asleep. When persistent, significant weight loss is observed due to the complete loss of appetite and sleep deprivation.  Blood pressure and heart rate becomes significantly elevated. Caution: If elevated blood pressure triggers a pounding headache associated with blurred vision, then the patient should immediately seek attention at an urgent or emergency care unit, as these may be signs of an impending stroke.    Emergency Department Pain Levels (6-10/10)  Emergency Room Pain 6 Severely   limiting. Requires emergency care and should not be seen or managed at an outpatient pain management facility. Communication becomes difficult and requires great effort. Assistance to reach the emergency department  may be required. Facial flushing and profuse sweating along with potentially dangerous increases in heart rate and blood pressure will be evident.   Distressing pain 7 Self-care is very difficult. Assistance is required to transport, or use restroom. Assistance to reach the emergency department will be required. Tasks requiring coordination, such as bathing and getting dressed become very difficult.   Disabling pain 8 Self-care is no longer possible. At this level, pain is disabling. The individual is unable to do even the most "basic" activities such as walking, eating, bathing, dressing, transferring to a bed, or toileting. Fine motor skills are lost. It is difficult to think clearly.   Incapacitating pain 9 Pain becomes incapacitating. Thought processing is no longer possible. Difficult to remember your own name. Control of movement and coordination are lost.   The worst pain imaginable 10 At this level, most patients pass out from pain. When this level is reached, collapse of the autonomic nervous system occurs, leading to a sudden drop in blood pressure and heart rate. This in turn results in a temporary and dramatic drop in blood flow to the brain, leading to a loss of consciousness. Fainting is one of the body's self defense mechanisms. Passing out puts the brain in a calmed state and causes it to shut down for a while, in order to begin the healing process.    Summary: 1. Refer to this scale when providing [place] with your pain level. 2. Be accurate and careful when reporting your pain level. This will help with your care. 3. Over-reporting your pain level will lead to loss of credibility. 4. Even a level of 1/10 means that there is pain and will be treated at our facility. 5. High, inaccurate reporting will be documented as "Symptom Exaggeration", leading to loss of credibility and suspicions of possible secondary gains such as obtaining more narcotics, or wanting to appear disabled, for  fraudulent reasons. 6. Only pain levels of 5 or below will be seen at our facility. 7. Pain levels of 6 and above will be sent to the Emergency Department and the appointment cancelled. ____________________________________________________________________________________________ BMI Assessment: Estimated body mass index is 34.86 kg/m as calculated from the following:   Height as of 08/06/17: 5\' 5"  (1.651 m).   Weight as of 08/06/17: 209 lb 8 oz (95 kg).  BMI interpretation table: BMI level Category Range association with higher incidence of chronic pain  <18 kg/m2 Underweight   18.5-24.9 kg/m2 Ideal body weight   25-29.9 kg/m2 Overweight Increased incidence by 20%  30-34.9 kg/m2 Obese (Class I) Increased incidence by 68%  35-39.9 kg/m2 Severe obesity (Class II) Increased incidence by 136%  >40 kg/m2 Extreme obesity (Class III) Increased incidence by 254%   BMI Readings from Last 4 Encounters:  08/06/17 34.86 kg/m  07/31/17 34.45 kg/m  06/01/17 34.79 kg/m  05/01/17 33.07 kg/m   Wt Readings from Last 4 Encounters:  08/06/17 209 lb 8 oz (95 kg)  07/31/17 207 lb (93.9 kg)  06/01/17 209 lb 8 oz (95 kg)  05/01/17 204 lb 14.4 oz (92.9 kg)

## 2017-08-29 NOTE — Progress Notes (Signed)
Safety precautions to be maintained throughout the outpatient stay will include: orient to surroundings, keep bed in low position, maintain call bell within reach at all times, provide assistance with transfer out of bed and ambulation.  

## 2017-08-29 NOTE — Progress Notes (Signed)
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 redness, swelling, or asymmetry. No contractures  Inspection: No masses, redness, swelling, or asymmetry. No contractures  Functional ROM: Unrestricted ROM          Functional ROM: Unrestricted ROM          Muscle strength & Tone: Functionally intact  Muscle strength & Tone: Functionally intact  Sensory: Unimpaired  Sensory: Unimpaired  Palpation: No palpable anomalies              Palpation: No palpable anomalies              Specialized Test(s): Deferred         Specialized Test(s): Deferred          Thoracic Spine Exam  Inspection: No masses, redness, or swelling Alignment: Symmetrical Functional ROM: Adequate ROM Stability: No instability detected Sensory: Unimpaired Muscle strength & Tone: Complains of area being tender to palpation  Lumbar Spine Exam  Inspection: No masses, redness, or swelling Alignment: Scoliosis detected Functional ROM: Adequate ROM      Stability: No instability detected Muscle strength & Tone: Functionally intact Sensory: Unimpaired Palpation:  Complains of area being tender to palpation       Provocative Tests: Lumbar Hyperextension and rotation test: Positive bilaterally for facet joint pain. Patrick's Maneuver: Positive for left-sided S-I arthralgia              Gait & Posture Assessment  Ambulation: Patient ambulates using a cane Gait: Relatively normal for age and body habitus Posture: WNL   Lower Extremity Exam  Side: Right lower extremity  Side: Left lower extremity  Inspection: Edema, brace worn  Inspection: No masses, redness, swelling, or asymmetry. No contractures  Functional ROM: Guarding          Functional ROM: Adequate ROM          Muscle strength & Tone: Functionally intact  Muscle strength & Tone: Functionally intact  Sensory: Unimpaired  Sensory: Unimpaired  Palpation: Complains of area being tender to palpation  Palpation: No palpable anomalies   Assessment  Primary Diagnosis & Pertinent Problem List: The primary encounter diagnosis was Chronic bilateral low back pain with left-sided sciatica (Primary Area of Pain). Diagnoses of Chronic pain of left lower extremity (Secondary Area of Pain), Chronic upper back pain (Tertiary Area of Pain) (L>R), Chronic neck pain (Fourth Area of Pain) (L>R), Occipital headache, Chronic sacroiliac joint pain, Chronic pain of both knees (R>L), Chronic pain syndrome, Pharmacologic therapy, Long term current use of opiate analgesic, Disorder of skeletal system, and Problems influencing health status were also pertinent to this visit.  Visit Diagnosis: 1. Chronic bilateral low back pain with left-sided sciatica (Primary Area of Pain)   2. Chronic pain of left lower extremity (Secondary Area of Pain)   3. Chronic upper back pain [facility] Area of Pain) (L>R)   4. Chronic neck pain (Fourth Area of Pain) (L>R)   5. Occipital headache   6. Chronic sacroiliac joint pain   7. Chronic pain of both knees (R>L)   8. Chronic pain syndrome   9. Pharmacologic therapy   10. Long term  current use of opiate analgesic   11. Disorder of skeletal system   12. Problems influencing health status    Plan of Care  Initial treatment plan:  Please be advised that as per protocol, today's visit has been an evaluation only. We have not taken over the patient's controlled substance management.  Problem-specific plan: No problem-specific Assessment & Plan notes found for this encounter.  Ordered Lab-work, Procedure(s), Referral(s), & Consult(s): Orders Placed This Encounter  Procedures  . DG Lumbar Spine Complete W/Bend  . DG Si Joints  . DG Knee 1-2 Views Left  . DG Knee 1-2 Views Right  . Compliance Drug Analysis, Ur  . Comp. Metabolic Panel (12)  . Magnesium  . Vitamin B12  . Sedimentation rate  . 25-Hydroxyvitamin D Lcms D2+D3  . C-reactive protein   Pharmacotherapy: Medications ordered:  No orders of the defined types were placed in this encounter.  Medications administered during this visit: [name] had no medications administered during this visit.   Pharmacotherapy under consideration:  Opioid Analgesics: The patient was informed that there is no guarantee that she would be a candidate for opioid analgesics. The decision will be made following CDC guidelines. This decision will be based on the results of diagnostic studies, as well as Ms. Murray Miller's risk profile.  Membrane stabilizer: To be determined at a later time Muscle relaxant: To be determined at a later time NSAID: To be determined at a later time Other analgesic(s): To be determined at a later time   Interventional therapies under consideration: [name] was informed that there is no guarantee that she would be a candidate for interventional therapies. The decision will be based on the results of diagnostic studies, as well as Ms. Murray Miller's risk profile.  Possible procedure(s): Diagnostic left-sided lumbar epidural steroid injection Diagnostic bilateral lumbar facet  nerve block Possible bilateral lumbar facet radiofrequency ablation Diagnostic  bilateral thoracic  facet nerve block Possible bilateral thoracic facet radiofrequency ablation Diagnostic bilateral cervical facet nerve block Possible bilateral cervical facet radiofrequency ablation Diagnostic bilateral greater occipital nerve blocks Diagnostic trigger point injections    Provider-requested follow-up: Return for 2nd Visit, w/ Dr. Dossie Arbour.  Future Appointments  Date Time Provider [place]  09/18/2017 10:00 AM [facility] MM 2 [facility]  09/26/2017  8:30 AM Milinda Pointer, MD [facility] None  11/26/2017  8:45 AM [facility] None  11/28/2017  8:15 AM Danford, Valetta Fuller D, NP [facility] None  08/06/2018 10:00 AM [facility] 6 [facility] None  08/06/2018 10:30 AM Nicholas Lose, MD [facility] None    Primary Care Physician: Esaw Grandchild, NP Location: [facility] Note by:  Date: 08/29/2017; Time: 1:53 PM  Pain Score Disclaimer: We use the NRS-11 scale. This is a self-reported, subjective measurement of pain severity with only modest accuracy. It is used primarily to identify changes within a particular patient. It must be understood that outpatient pain scales are significantly less accurate that those used for research, where they can be applied under ideal controlled circumstances with minimal exposure to variables. In reality, the score is likely to be a combination of pain intensity and pain affect, where pain affect describes the degree of emotional arousal or changes in action readiness caused by the sensory experience of pain. Factors such as social and work situation, setting, emotional state, anxiety levels, expectation, and prior pain experience may influence pain perception and show large inter-individual differences that may also be affected by time variables.  Patient instructions provided during this  appointment: Patient Instructions   ____________________________________________________________________________________________  Appointment Policy Summary  It is our goal and responsibility to provide the medical community with assistance in the evaluation and management of patients with chronic pain. Unfortunately our resources are limited. Because we do not have an unlimited amount of time, or available appointments, we are required to closely monitor and manage their use. The following rules exist to maximize their use:  Patient's responsibilities: 1. Punctuality:  At what time should I arrive? You should be physically present in our office 30 minutes before your scheduled appointment. Your scheduled appointment is with your assigned healthcare provider. However, it takes 5-10 minutes to be "checked-in", and another 15 minutes for the nurses to do the admission. If you arrive to our office at the time you were given for your appointment, you will end up being at least 20-25 minutes late to your appointment with the provider. 2. Tardiness:  What happens if I arrive only a few minutes after my scheduled appointment time? You will need to reschedule your appointment. The cutoff is your appointment time. This is why it is so important that you arrive at least 30 minutes before that appointment. If you have an appointment scheduled for 10:00 AM and you arrive at 10:01, you will be required to reschedule your appointment.  3. Plan ahead:  Always assume that you will encounter traffic on your way in. Plan for it. If you are dependent on a driver, make sure they understand these rules and the need to arrive early. 4. Other appointments and responsibilities:  Avoid scheduling any other appointments before or after your pain clinic appointments.  5. Be prepared:  Write down everything that you need to discuss with your healthcare provider and give this information to the admitting nurse. Write down the  medications that you will need refilled. Bring your pills and bottles (even  the empty ones), to all of your appointments, except for those where a procedure is scheduled. 6. No children or pets:  Find someone to take care of them. It is not appropriate to bring them in. 7. Scheduling changes:  We request "advanced notification" of any changes or cancellations. 8. Advanced notification:  Defined as a time period of more than 24 hours prior to the originally scheduled appointment. This allows for the appointment to be offered to other patients. 9. Rescheduling:  When a visit is rescheduled, it will require the cancellation of the original appointment. For this reason they both fall within the category of "Cancellations".  10. Cancellations:  They require advanced notification. Any cancellation less than 24 hours before the  appointment will be recorded as a "No Show". 11. No Show:  Defined as an unkept appointment where the patient failed to notify or declare to the practice their intention or inability to keep the appointment.  Corrective process for repeat offenders:  1. Tardiness: Three (3) episodes of rescheduling due to late arrivals will be recorded as one (1) "No Show". 2. Cancellation or reschedule: Three (3) cancellations or rescheduling will be recorded as one (1) "No Show". 3. "No Shows": Three (3) "No Shows" within a 12 month period will result in discharge from the practice. ____________________________________________________________________________________________  ____________________________________________________________________________________________  Pain Scale  Introduction: The pain score used by this practice is the Verbal Numerical Rating Scale (VNRS-11). This is an 11-point scale. It is for adults and children 10 years or older. There are significant differences in how the pain score is reported, used, and applied. Forget everything you learned in the past and learn  this scoring system.  General Information: The scale should reflect your current level of pain. Unless you are specifically asked for the level of your worst pain, or your average pain. If you are asked for one of these two, then it should be understood that it is over the past 24 hours.  Basic Activities of Daily Living (ADL): Personal hygiene, dressing, eating, transferring, and using restroom.  Instructions: Most patients tend to report their level of pain as a combination of two factors, their physical pain and their psychosocial pain. This last one is also known as "suffering" and it is reflection of how physical pain affects you socially and psychologically. From now on, report them separately. From this point on, when asked to report your pain level, report only your physical pain. Use the following table for reference.  Pain Clinic Pain Levels (0-5/10)  Pain Level Score  Description  No Pain 0   Mild pain 1 Nagging, annoying, but does not interfere with basic activities of daily living (ADL). Patients are able to eat, bathe, get dressed, toileting (being able to get on and off the toilet and perform personal hygiene functions), transfer (move in and out of bed or a chair without assistance), and maintain continence (able to control bladder and bowel functions). Blood pressure and heart rate are unaffected. A normal heart rate for a healthy adult ranges from 60 to 100 bpm (beats per minute).   Mild to moderate pain 2 Noticeable and distracting. Impossible to hide from other people. More frequent flare-ups. Still possible to adapt and function close to normal. It can be very annoying and may have occasional stronger flare-ups. With discipline, patients may get used to it and adapt.   Moderate pain 3 Interferes significantly with activities of daily living (ADL). It becomes difficult to feed, bathe, get dressed, get  on and off the toilet or to perform personal hygiene functions. Difficult to get  in and out of bed or a chair without assistance. Very distracting. With effort, it can be ignored when deeply involved in activities.   Moderately severe pain 4 Impossible to ignore for more than a few minutes. With effort, patients may still be able to manage work or participate in some social activities. Very difficult to concentrate. Signs of autonomic nervous system discharge are evident: dilated pupils (mydriasis); mild sweating (diaphoresis); sleep interference. Heart rate becomes elevated (>115 bpm). Diastolic blood pressure (lower number) rises above 100 mmHg. Patients find relief in laying down and not moving.   Severe pain 5 Intense and extremely unpleasant. Associated with frowning face and frequent crying. Pain overwhelms the senses.  Ability to do any activity or maintain social relationships becomes significantly limited. Conversation becomes difficult. Pacing back and forth is common, as getting into a comfortable position is nearly impossible. Pain wakes you up from deep sleep. Physical signs will be obvious: pupillary dilation; increased sweating; goosebumps; brisk reflexes; cold, clammy hands and feet; nausea, vomiting or dry heaves; loss of appetite; significant sleep disturbance with inability to fall asleep or to remain asleep. When persistent, significant weight loss is observed due to the complete loss of appetite and sleep deprivation.  Blood pressure and heart rate becomes significantly elevated. Caution: If elevated blood pressure triggers a pounding headache associated with blurred vision, then the patient should immediately seek attention at an urgent or emergency care unit, as these may be signs of an impending stroke.    Emergency Department Pain Levels (6-10/10)  Emergency Room Pain 6 Severely limiting. Requires emergency care and should not be seen or managed at an outpatient pain management facility. Communication becomes difficult and requires great effort. Assistance to  reach the emergency department may be required. Facial flushing and profuse sweating along with potentially dangerous increases in heart rate and blood pressure will be evident.   Distressing pain 7 Self-care is very difficult. Assistance is required to transport, or use restroom. Assistance to reach the emergency department will be required. Tasks requiring coordination, such as bathing and getting dressed become very difficult.   Disabling pain 8 Self-care is no longer possible. At this level, pain is disabling. The individual is unable to do even the most "basic" activities such as walking, eating, bathing, dressing, transferring to a bed, or toileting. Fine motor skills are lost. It is difficult to think clearly.   Incapacitating pain 9 Pain becomes incapacitating. Thought processing is no longer possible. Difficult to remember your own name. Control of movement and coordination are lost.   The worst pain imaginable 10 At this level, most patients pass out from pain. When this level is reached, collapse of the autonomic nervous system occurs, leading to a sudden drop in blood pressure and heart rate. This in turn results in a temporary and dramatic drop in blood flow to the brain, leading to a loss of consciousness. Fainting is one of the body's self defense mechanisms. Passing out puts the brain in a calmed state and causes it to shut down for a while, in order to begin the healing process.    Summary: 1. Refer to this scale when providing [place] with your pain level. 2. Be accurate and careful when reporting your pain level. This will help with your care. 3. Over-reporting your pain level will lead to loss of credibility. 4. Even a level of 1/10 means that there is  pain and will be treated at our facility. 5. High, inaccurate reporting will be documented as "Symptom Exaggeration", leading to loss of credibility and suspicions of possible secondary gains such as obtaining more narcotics, or wanting  to appear disabled, for fraudulent reasons. 6. Only pain levels of 5 or below will be seen at our facility. 7. Pain levels of 6 and above will be sent to the Emergency Department and the appointment cancelled. ____________________________________________________________________________________________ BMI Assessment: Estimated body mass index is 34.86 kg/m as calculated from the following:   Height as of 08/06/17: _0  (1.651 m).   Weight as of 08/06/17: 209 lb 8 oz (95 kg).  BMI interpretation table: BMI level Category Range association with higher incidence of chronic pain  <18 kg/m2 Underweight   18.5-24.9 kg/m2 Ideal body weight   25-29.9 kg/m2 Overweight Increased incidence by 20%  30-34.9 kg/m2 Obese (Class I) Increased incidence by 68%  35-39.9 kg/m2 Severe obesity (Class II) Increased incidence by 136%  >40 kg/m2 Extreme obesity (Class III) Increased incidence by 254%   BMI Readings from Last 4 Encounters:  08/06/17 34.86 kg/m  07/31/17 34.45 kg/m  06/01/17 34.79 kg/m  05/01/17 33.07 kg/m   Wt Readings from Last 4 Encounters:  08/06/17 209 lb 8 oz (95 kg)  07/31/17 207 lb (93.9 kg)  06/01/17 209 lb 8 oz (95 kg)  05/01/17 204 lb 14.4 oz (92.9 kg)

## 2017-08-29 NOTE — Progress Notes (Signed)
Results were reviewed and found to be: mildly abnormal  No acute injury or pathology identified  Review would suggest interventional pain management techniques may be of benefit 

## 2017-09-03 ENCOUNTER — Other Ambulatory Visit: Payer: Self-pay | Admitting: Nurse Practitioner

## 2017-09-03 ENCOUNTER — Encounter: Payer: Self-pay | Admitting: Nurse Practitioner

## 2017-09-03 DIAGNOSIS — R700 Elevated erythrocyte sedimentation rate: Secondary | ICD-10-CM

## 2017-09-03 DIAGNOSIS — R7982 Elevated C-reactive protein (CRP): Secondary | ICD-10-CM | POA: Insufficient documentation

## 2017-09-04 LAB — VITAMIN B12: VITAMIN B 12: 728 pg/mL (ref 232–1245)

## 2017-09-04 LAB — COMP. METABOLIC PANEL (12)
ALBUMIN: 4.1 g/dL (ref 3.6–4.8)
ALK PHOS: 80 IU/L (ref 39–117)
AST: 26 IU/L (ref 0–40)
Albumin/Globulin Ratio: 1.2 (ref 1.2–2.2)
BILIRUBIN TOTAL: 0.4 mg/dL (ref 0.0–1.2)
BUN / CREAT RATIO: 10 — AB (ref 12–28)
BUN: 8 mg/dL (ref 8–27)
CHLORIDE: 101 mmol/L (ref 96–106)
Calcium: 8.7 mg/dL (ref 8.7–10.3)
Creatinine, Ser: 0.84 mg/dL (ref 0.57–1.00)
GFR calc Af Amer: 85 mL/min/{1.73_m2} (ref >59)
GFR calc non Af Amer: 74 mL/min/{1.73_m2} (ref >59)
GLOBULIN, TOTAL: 3.3 g/dL (ref 1.5–4.5)
Glucose: 82 mg/dL (ref 65–99)
POTASSIUM: 4.5 mmol/L (ref 3.5–5.2)
SODIUM: 138 mmol/L (ref 134–144)
TOTAL PROTEIN: 7.4 g/dL (ref 6.0–8.5)

## 2017-09-04 LAB — 25-HYDROXY VITAMIN D LCMS D2+D3
25-Hydroxy, Vitamin D-3: 21 ng/mL
25-Hydroxy, Vitamin D: 21 ng/mL — ABNORMAL LOW

## 2017-09-04 LAB — COMPLIANCE DRUG ANALYSIS, UR

## 2017-09-04 LAB — 25-HYDROXYVITAMIN D LCMS D2+D3

## 2017-09-04 LAB — MAGNESIUM: MAGNESIUM: 2.2 mg/dL (ref 1.6–2.3)

## 2017-09-04 LAB — SEDIMENTATION RATE: Sed Rate: 46 mm/hr — ABNORMAL HIGH (ref 0–40)

## 2017-09-04 LAB — C-REACTIVE PROTEIN: CRP: 12 mg/L — ABNORMAL HIGH (ref 0–10)

## 2017-09-18 ENCOUNTER — Ambulatory Visit
Admission: RE | Admit: 2017-09-18 | Discharge: 2017-09-18 | Disposition: A | Discharge status: Home / Self Care | Payer: Medicare Other | Source: Ambulatory Visit | Attending: Adult Health | Admitting: Adult Health

## 2017-09-18 DIAGNOSIS — Z1231 Encounter for screening mammogram for malignant neoplasm of breast: Secondary | ICD-10-CM

## 2017-09-24 DIAGNOSIS — M7582 Other shoulder lesions, left shoulder: Secondary | ICD-10-CM | POA: Insufficient documentation

## 2017-09-24 DIAGNOSIS — M797 Fibromyalgia: Secondary | ICD-10-CM | POA: Insufficient documentation

## 2017-09-24 DIAGNOSIS — M7581 Other shoulder lesions, right shoulder: Secondary | ICD-10-CM | POA: Insufficient documentation

## 2017-09-24 DIAGNOSIS — R1319 Other dysphagia: Secondary | ICD-10-CM | POA: Insufficient documentation

## 2017-09-25 DIAGNOSIS — M51369 Other intervertebral disc degeneration, lumbar region without mention of lumbar back pain or lower extremity pain: Secondary | ICD-10-CM | POA: Insufficient documentation

## 2017-09-25 DIAGNOSIS — M5136 Other intervertebral disc degeneration, lumbar region: Secondary | ICD-10-CM | POA: Insufficient documentation

## 2017-09-25 DIAGNOSIS — E559 Vitamin D deficiency, unspecified: Secondary | ICD-10-CM | POA: Insufficient documentation

## 2017-09-25 DIAGNOSIS — M4180 Other forms of scoliosis, site unspecified: Secondary | ICD-10-CM | POA: Insufficient documentation

## 2017-09-25 DIAGNOSIS — G4486 Cervicogenic headache: Secondary | ICD-10-CM | POA: Insufficient documentation

## 2017-09-25 DIAGNOSIS — M5134 Other intervertebral disc degeneration, thoracic region: Secondary | ICD-10-CM | POA: Insufficient documentation

## 2017-09-25 DIAGNOSIS — R51 Headache: Secondary | ICD-10-CM

## 2017-09-25 DIAGNOSIS — M47816 Spondylosis without myelopathy or radiculopathy, lumbar region: Secondary | ICD-10-CM | POA: Insufficient documentation

## 2017-09-25 NOTE — Progress Notes (Signed)
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 well developed, and well hydrated. In no apparent acute distress Vitals:   09/26/17 0826  BP: 122/75  Pulse: 78  Temp: 98.3 F (36.8 C)  SpO2: 98%  Weight: 207 lb (93.9 kg)  Height: 5' 5"  (1.651 m)   BMI Assessment: Estimated body mass index is 34.45 kg/m as calculated from the following:   Height as of this encounter: 5' 5"  (1.651 m).   Weight as of this encounter: 207 lb (93.9 kg).  BMI interpretation table: BMI level Category Range association with higher incidence of chronic pain  <18 kg/m2 Underweight   18.5-24.9 kg/m2 Ideal body weight   25-29.9 kg/m2 Overweight Increased incidence by 20%  30-34.9 kg/m2 Obese (Class I) Increased incidence by 68%  35-39.9 kg/m2 Severe obesity (Class  II) Increased incidence by 136%  >40 kg/m2 Extreme obesity (Class III) Increased incidence by 254%   Patient's current BMI Ideal Body weight  Body mass index is 34.45 kg/m. Ideal body weight: 57 kg (125 lb 10.6 oz) Adjusted ideal body weight: 71.8 kg (158 lb 3.2 oz)   BMI Readings from Last 4 Encounters:  09/26/17 34.45 kg/m  08/29/17 34.45 kg/m  08/06/17 34.86 kg/m  07/31/17 34.45 kg/m   Wt Readings from Last 4 Encounters:  09/26/17 207 lb (93.9 kg)  08/29/17 207 lb (93.9 kg)  08/06/17 209 lb 8 oz (95 kg)  07/31/17 207 lb (93.9 kg)  Psych/Mental status: Alert, oriented x 3 (person, place, & time)       Eyes: PERLA Respiratory: No evidence of acute respiratory distress  Cervical Spine Area Exam  Skin & Axial Inspection: No masses, redness, edema, swelling, or associated skin lesions Alignment: Symmetrical Functional ROM: Unrestricted ROM      Stability: No instability detected Muscle Tone/Strength: Functionally intact. No obvious neuro-muscular anomalies detected. Sensory (Neurological): Unimpaired Palpation: No palpable anomalies              Upper Extremity (UE) Exam    Side: Right upper extremity  Side: Left upper extremity  Skin & Extremity Inspection: Skin color, temperature, and hair growth are WNL. No peripheral edema or cyanosis. No masses, redness, swelling, asymmetry, or associated skin lesions. No contractures.  Skin & Extremity Inspection: Skin color, temperature, and hair growth are WNL. No peripheral edema or cyanosis. No masses, redness, swelling, asymmetry, or associated skin lesions. No contractures.  Functional ROM: Decreased ROM for shoulder  Functional ROM: Decreased ROM for shoulder  Muscle Tone/Strength: Functionally intact. No obvious neuro-muscular anomalies detected.  Muscle Tone/Strength: Functionally intact. No obvious neuro-muscular anomalies detected.  Sensory (Neurological): Unimpaired          Sensory (Neurological): Unimpaired           Palpation: No palpable anomalies              Palpation: No palpable anomalies              Provocative Test(s):  Phalen's test: deferred Tinel's test: deferred Apley's scratch test (touch opposite shoulder):  Action 1 (Across chest): Adequate ROM Action 2 (Overhead): Adequate ROM Action 3 (LB reach): Decreased ROM   Provocative Test(s):  Phalen's test: deferred Tinel's test: deferred Apley's scratch test (touch opposite shoulder):  Action 1 (Across chest): Adequate ROM Action 2 (Overhead): Adequate ROM Action 3 (LB reach): Decreased ROM    Thoracic Spine Area Exam  Skin & Axial Inspection: No masses, redness, or swelling Alignment: Symmetrical Functional ROM: Unrestricted ROM Stability: No instability detected Muscle Tone/Strength: Functionally intact. No obvious neuro-muscular anomalies detected. Sensory (Neurological): Unimpaired Muscle strength & Tone: No palpable anomalies  Lumbar Spine Area Exam  Skin & Axial Inspection: No masses, redness, or swelling Alignment: Symmetrical Functional ROM: Decreased ROM       Stability: No instability detected Muscle Tone/Strength: Functionally intact. No obvious neuro-muscular anomalies detected. Sensory (Neurological): Movement-associated pain Palpation: Complains of area being tender to palpation       Provocative Tests: Hyperextension/rotation test: (+) bilaterally for facet joint pain. Lumbar quadrant test (Kemp's test): (+) bilaterally for facet joint pain. Lateral bending test: deferred today       Patrick's Maneuver: (+) for left-sided S-I arthralgia and for bilateral hip arthralgia FABER test: (+) for left-sided S-I arthralgia and for bilateral hip arthralgia S-I anterior distraction/compression test: deferred today         S-I lateral compression test: deferred today         S-I Thigh-thrust test: deferred today         S-I Gaenslen's test: deferred today          Gait & Posture Assessment  Ambulation: Patient  ambulates using a cane Gait: Limited. Using assistive device to ambulate Posture: Antalgic   Lower Extremity Exam    Side: Right lower extremity  Side: Left lower extremity  Stability: No instability observed          Stability: No instability observed          Skin & Extremity Inspection: Skin color, temperature, and hair growth are WNL. No peripheral edema or cyanosis. No masses, redness, swelling, asymmetry, or associated skin lesions. No  contractures.  Skin & Extremity Inspection: Skin color, temperature, and hair growth are WNL. No peripheral edema or cyanosis. No masses, redness, swelling, asymmetry, or associated skin lesions. No contractures.  Functional ROM: Decreased ROM for hip joint          Functional ROM: Decreased ROM for hip joint          Muscle Tone/Strength: Able to Toe-walk & Heel-walk without problems  Muscle Tone/Strength: Able to Toe-walk & Heel-walk without problems  Sensory (Neurological): Unimpaired  Sensory (Neurological): Unimpaired  Palpation: No palpable anomalies  Palpation: No palpable anomalies   Assessment & Plan  Primary Diagnosis & Pertinent Problem List: The primary encounter diagnosis was Chronic pain syndrome. Diagnoses of Chronic low back pain (Primary Area of Pain) (Bilateral) w/ sciatica (Left), DDD (degenerative disc disease), lumbar, Lumbar facet arthropathy, Lumbar facet syndrome, Spondylosis without myelopathy or radiculopathy, lumbar region, Chronic sacroiliac joint pain, Chronic lower extremity pain (Secondary Area of Pain) (Left), Chronic knee pain (Bilateral) (R>L), Chronic upper back pain (Tertiary Area of Pain) (Bilateral) (L>R), DDD (degenerative disc disease), thoracic, Thoracolumbar dextroscoliosis, Chronic neck pain (Fourth Area of Pain) (Bilateral) (L>R), Occipital headache, Cervicogenic headache, Elevated C-reactive protein (CRP), Elevated sed rate, Vitamin D deficiency, Chronic shoulder pain (Bilateral) (R>L), Chronic hip pain (Bilateral)  (L>R), and Cervicalgia were also pertinent to this visit.  Visit Diagnosis: 1. Chronic pain syndrome   2. Chronic low back pain (Primary Area of Pain) (Bilateral) w/ sciatica (Left)   3. DDD (degenerative disc disease), lumbar   4. Lumbar facet arthropathy   5. Lumbar facet syndrome   6. Spondylosis without myelopathy or radiculopathy, lumbar region   7. Chronic sacroiliac joint pain   8. Chronic lower extremity pain (Secondary Area of Pain) (Left)   9. Chronic knee pain (Bilateral) (R>L)   10. Chronic upper back pain [facility] Area of Pain) (Bilateral) (L>R)   11. DDD (degenerative disc disease), thoracic   12. Thoracolumbar dextroscoliosis   13. Chronic neck pain (Fourth Area of Pain) (Bilateral) (L>R)   14. Occipital headache   15. Cervicogenic headache   16. Elevated C-reactive protein (CRP)   17. Elevated sed rate   18. Vitamin D deficiency   19. Chronic shoulder pain (Bilateral) (R>L)   20. Chronic hip pain (Bilateral) (L>R)   21. Cervicalgia    Problems updated and reviewed during this visit: No problems updated.  Plan of Care  Pharmacotherapy (Medications Ordered): Meds ordered this encounter  Medications  . ergocalciferol (VITAMIN D2) 50000 units capsule    Sig: Take 1 capsule (50,000 Units total) by mouth 2 (two) times a week. X 6 weeks.    Dispense:  12 capsule    Refill:  0    Do not add this medication to the electronic "Automatic Refill" notification system. Patient may have prescription filled one day early if pharmacy is closed on scheduled refill date.  . Cholecalciferol (VITAMIN D3) 5000 units CAPS    Sig: Take 1 capsule (5,000 Units total) by mouth daily with breakfast. Take along with calcium and magnesium.    Dispense:  30 capsule    Refill:  5    Do not place medication on "Automatic Refill".  May substitute with similar over-the-counter product.  . Magnesium 500 MG CAPS    Sig: Take 1 capsule (500 mg total) by mouth 2 (two) times daily at 8 am and 10  pm.    Dispense:  60 capsule    Refill:  5    Do not  place medication on "Automatic Refill".  The patient may use similar over-the-counter product.  . Calcium Carbonate-Vit D-Min (GNP CALCIUM 1200) 1200-1000 MG-UNIT CHEW    Sig: Chew 1,200 mg by mouth daily with breakfast. Take in combination with vitamin D and magnesium.    Dispense:  30 tablet    Refill:  5    Do not place medication on "Automatic Refill".  May substitute with similar over-the-counter product.  . traMADol (ULTRAM) 50 MG tablet    Sig: Take 2 tablets (100 mg total) by mouth every 6 (six) hours as needed for severe pain.    Dispense:  240 tablet    Refill:  0    Medication for Chronic Pain (G89.4). [place] STOP ACT - Not applicable. Fill one day early if pharmacy is closed on scheduled refill date.  Do not fill until: 10/03/17 To last until: 11/02/17  . traMADol (ULTRAM) 50 MG tablet    Sig: Take 2 tablets (100 mg total) by mouth every 6 (six) hours as needed for up to 7 days for severe pain.    Dispense:  56 tablet    Refill:  0    Medication for Chronic Pain (G89.4). [place] STOP ACT - Not applicable. Fill one day early if pharmacy is closed on scheduled refill date.  Do not fill until: 09/26/17 To last until: 10/03/17    Procedure Orders     LUMBAR FACET(MEDIAL BRANCH NERVE BLOCK) MBNB  Lab Orders     Rheumatoid factor     ANA w/Reflex if Positive     Uric acid, random urine     Uric acid  Imaging Orders     DG HIP UNILAT W OR W/O PELVIS 2-3 VIEWS RIGHT     DG HIP UNILAT W OR W/O PELVIS 2-3 VIEWS LEFT     DG Shoulder Right     DG Shoulder Left     DG Cervical Spine With Flex & Extend Referral Orders  No referral(s) requested today   Pharmacological management options:  Opioid Analgesics: We'll take over management today. See above orders Membrane stabilizer: We have discussed the possibility of optimizing this mode of therapy, if tolerated Muscle relaxant: We have discussed the possibility of a trial NSAID:  We have discussed the possibility of a trial Other analgesic(s): To be determined at a later time   Interventional management options: Planned, scheduled, and/or pending:    Diagnostic bilateral lumbar facet nerve block #1 under fluoro and IV sedation   Considering:   Diagnostic left-sided lumbar epidural steroid injection Diagnostic bilateral lumbar facet nerve block Possible bilateral lumbar facet radiofrequency ablation Diagnostic  bilateral thoracic facet nerve block Possible bilateral thoracic facet radiofrequency ablation Diagnostic bilateral cervical facet nerve block Possible bilateral cervical facet radiofrequency ablation Diagnostic bilateral greater occipital nerve blocks Diagnostic trigger point injections    PRN Procedures:   None at this time   Provider-requested follow-up: Return for Procedure (w/ sedation): (B) L-FCT BLK #1.  Future Appointments  Date Time Provider [place]  10/09/2017  8:30 AM Milinda Pointer, MD [facility] None  11/26/2017  8:45 AM [facility] None  11/28/2017  8:15 AM Danford, Valetta Fuller D, NP [facility] None  08/06/2018 10:00 AM [facility] None  08/06/2018 10:30 AM Nicholas Lose, MD [facility] None    Primary Care Physician: Esaw Grandchild, NP Location: [facility] Note by: Gaspar Cola, MD Date: 09/26/2017; Time: 9:57 AM

## 2017-09-26 ENCOUNTER — Ambulatory Visit
Admission: RE | Admit: 2017-09-26 | Discharge: 2017-09-26 | Disposition: A | Discharge status: Home / Self Care | Payer: Medicare Other | Source: Ambulatory Visit | Attending: Pain Medicine | Admitting: Pain Medicine

## 2017-09-26 ENCOUNTER — Ambulatory Visit (HOSPITAL_BASED_OUTPATIENT_CLINIC_OR_DEPARTMENT_OTHER): Payer: Medicare Other | Admitting: Pain Medicine

## 2017-09-26 ENCOUNTER — Encounter: Payer: Self-pay | Admitting: Pain Medicine

## 2017-09-26 ENCOUNTER — Other Ambulatory Visit: Payer: Self-pay

## 2017-09-26 VITALS — BP 122/75 | HR 78 | Temp 98.3°F | Ht 65.0 in | Wt 207.0 lb

## 2017-09-26 DIAGNOSIS — F419 Anxiety disorder, unspecified: Secondary | ICD-10-CM | POA: Insufficient documentation

## 2017-09-26 DIAGNOSIS — M5134 Other intervertebral disc degeneration, thoracic region: Secondary | ICD-10-CM

## 2017-09-26 DIAGNOSIS — K219 Gastro-esophageal reflux disease without esophagitis: Secondary | ICD-10-CM | POA: Diagnosis not present

## 2017-09-26 DIAGNOSIS — M25512 Pain in left shoulder: Secondary | ICD-10-CM

## 2017-09-26 DIAGNOSIS — G8929 Other chronic pain: Secondary | ICD-10-CM

## 2017-09-26 DIAGNOSIS — M47816 Spondylosis without myelopathy or radiculopathy, lumbar region: Secondary | ICD-10-CM

## 2017-09-26 DIAGNOSIS — M25562 Pain in left knee: Secondary | ICD-10-CM | POA: Diagnosis not present

## 2017-09-26 DIAGNOSIS — M25561 Pain in right knee: Secondary | ICD-10-CM

## 2017-09-26 DIAGNOSIS — Z79899 Other long term (current) drug therapy: Secondary | ICD-10-CM | POA: Insufficient documentation

## 2017-09-26 DIAGNOSIS — M50322 Other cervical disc degeneration at C5-C6 level: Secondary | ICD-10-CM | POA: Insufficient documentation

## 2017-09-26 DIAGNOSIS — K7460 Unspecified cirrhosis of liver: Secondary | ICD-10-CM | POA: Insufficient documentation

## 2017-09-26 DIAGNOSIS — R7982 Elevated C-reactive protein (CRP): Secondary | ICD-10-CM | POA: Diagnosis not present

## 2017-09-26 DIAGNOSIS — M549 Dorsalgia, unspecified: Secondary | ICD-10-CM

## 2017-09-26 DIAGNOSIS — Z9221 Personal history of antineoplastic chemotherapy: Secondary | ICD-10-CM | POA: Diagnosis not present

## 2017-09-26 DIAGNOSIS — Z825 Family history of asthma and other chronic lower respiratory diseases: Secondary | ICD-10-CM | POA: Insufficient documentation

## 2017-09-26 DIAGNOSIS — G4700 Insomnia, unspecified: Secondary | ICD-10-CM | POA: Insufficient documentation

## 2017-09-26 DIAGNOSIS — E559 Vitamin D deficiency, unspecified: Secondary | ICD-10-CM

## 2017-09-26 DIAGNOSIS — G894 Chronic pain syndrome: Secondary | ICD-10-CM

## 2017-09-26 DIAGNOSIS — R51 Headache: Secondary | ICD-10-CM | POA: Diagnosis not present

## 2017-09-26 DIAGNOSIS — M25552 Pain in left hip: Secondary | ICD-10-CM

## 2017-09-26 DIAGNOSIS — Z90710 Acquired absence of both cervix and uterus: Secondary | ICD-10-CM | POA: Insufficient documentation

## 2017-09-26 DIAGNOSIS — Z87891 Personal history of nicotine dependence: Secondary | ICD-10-CM | POA: Insufficient documentation

## 2017-09-26 DIAGNOSIS — M533 Sacrococcygeal disorders, not elsewhere classified: Secondary | ICD-10-CM | POA: Diagnosis not present

## 2017-09-26 DIAGNOSIS — M25511 Pain in right shoulder: Secondary | ICD-10-CM

## 2017-09-26 DIAGNOSIS — F329 Major depressive disorder, single episode, unspecified: Secondary | ICD-10-CM | POA: Insufficient documentation

## 2017-09-26 DIAGNOSIS — M5442 Lumbago with sciatica, left side: Secondary | ICD-10-CM

## 2017-09-26 DIAGNOSIS — N3946 Mixed incontinence: Secondary | ICD-10-CM | POA: Insufficient documentation

## 2017-09-26 DIAGNOSIS — Z8572 Personal history of non-Hodgkin lymphomas: Secondary | ICD-10-CM | POA: Insufficient documentation

## 2017-09-26 DIAGNOSIS — J45909 Unspecified asthma, uncomplicated: Secondary | ICD-10-CM | POA: Diagnosis not present

## 2017-09-26 DIAGNOSIS — Z789 Other specified health status: Secondary | ICD-10-CM | POA: Insufficient documentation

## 2017-09-26 DIAGNOSIS — R700 Elevated erythrocyte sedimentation rate: Secondary | ICD-10-CM | POA: Diagnosis not present

## 2017-09-26 DIAGNOSIS — M9981 Other biomechanical lesions of cervical region: Secondary | ICD-10-CM

## 2017-09-26 DIAGNOSIS — M25551 Pain in right hip: Secondary | ICD-10-CM

## 2017-09-26 DIAGNOSIS — M542 Cervicalgia: Secondary | ICD-10-CM

## 2017-09-26 DIAGNOSIS — M51369 Other intervertebral disc degeneration, lumbar region without mention of lumbar back pain or lower extremity pain: Secondary | ICD-10-CM

## 2017-09-26 DIAGNOSIS — G4486 Cervicogenic headache: Secondary | ICD-10-CM

## 2017-09-26 DIAGNOSIS — M1A00X0 Idiopathic chronic gout, unspecified site, without tophus (tophi): Secondary | ICD-10-CM

## 2017-09-26 DIAGNOSIS — M4185 Other forms of scoliosis, thoracolumbar region: Secondary | ICD-10-CM | POA: Insufficient documentation

## 2017-09-26 DIAGNOSIS — M4180 Other forms of scoliosis, site unspecified: Secondary | ICD-10-CM

## 2017-09-26 DIAGNOSIS — M4802 Spinal stenosis, cervical region: Secondary | ICD-10-CM | POA: Insufficient documentation

## 2017-09-26 DIAGNOSIS — M5116 Intervertebral disc disorders with radiculopathy, lumbar region: Secondary | ICD-10-CM | POA: Diagnosis not present

## 2017-09-26 DIAGNOSIS — M5136 Other intervertebral disc degeneration, lumbar region: Secondary | ICD-10-CM

## 2017-09-26 DIAGNOSIS — E790 Hyperuricemia without signs of inflammatory arthritis and tophaceous disease: Secondary | ICD-10-CM

## 2017-09-26 DIAGNOSIS — Z6834 Body mass index (BMI) 34.0-34.9, adult: Secondary | ICD-10-CM | POA: Insufficient documentation

## 2017-09-26 DIAGNOSIS — Z8619 Personal history of other infectious and parasitic diseases: Secondary | ICD-10-CM | POA: Diagnosis not present

## 2017-09-26 DIAGNOSIS — M79605 Pain in left leg: Secondary | ICD-10-CM

## 2017-09-26 DIAGNOSIS — I10 Essential (primary) hypertension: Secondary | ICD-10-CM | POA: Diagnosis not present

## 2017-09-26 DIAGNOSIS — M5030 Other cervical disc degeneration, unspecified cervical region: Secondary | ICD-10-CM

## 2017-09-26 DIAGNOSIS — Z8249 Family history of ischemic heart disease and other diseases of the circulatory system: Secondary | ICD-10-CM | POA: Insufficient documentation

## 2017-09-26 DIAGNOSIS — E079 Disorder of thyroid, unspecified: Secondary | ICD-10-CM | POA: Insufficient documentation

## 2017-09-26 DIAGNOSIS — Z791 Long term (current) use of non-steroidal anti-inflammatories (NSAID): Secondary | ICD-10-CM | POA: Insufficient documentation

## 2017-09-26 DIAGNOSIS — R519 Headache, unspecified: Secondary | ICD-10-CM

## 2017-09-26 DIAGNOSIS — M797 Fibromyalgia: Secondary | ICD-10-CM | POA: Insufficient documentation

## 2017-09-26 DIAGNOSIS — Z9049 Acquired absence of other specified parts of digestive tract: Secondary | ICD-10-CM | POA: Insufficient documentation

## 2017-09-26 DIAGNOSIS — E785 Hyperlipidemia, unspecified: Secondary | ICD-10-CM | POA: Insufficient documentation

## 2017-09-26 DIAGNOSIS — E669 Obesity, unspecified: Secondary | ICD-10-CM | POA: Insufficient documentation

## 2017-09-26 MED ORDER — MAGNESIUM 500 MG PO CAPS: 500.0000 mg | ORAL_CAPSULE | Freq: Two times a day (BID) | ORAL | 5 refills | Status: DC

## 2017-09-26 MED ORDER — TRAMADOL HCL 50 MG PO TABS: 100.0000 mg | ORAL_TABLET | Freq: Four times a day (QID) | ORAL | 0 refills | Status: DC | PRN

## 2017-09-26 MED ORDER — ERGOCALCIFEROL 1.25 MG (50000 UT) PO CAPS: 50000.0000 [IU] | ORAL_CAPSULE | ORAL | 0 refills | Status: AC

## 2017-09-26 MED ORDER — GNP CALCIUM 1200 1200-1000 MG-UNIT PO CHEW: 1200.0000 mg | CHEWABLE_TABLET | Freq: Every day | ORAL | 5 refills | Status: DC

## 2017-09-26 MED ORDER — VITAMIN D3 125 MCG (5000 UT) PO CAPS: 1.0000 | ORAL_CAPSULE | Freq: Every day | ORAL | 5 refills | Status: DC

## 2017-09-26 NOTE — Patient Instructions (Addendum)
____________________________________________________________________________________________  Pain Scale  Introduction: The pain score used by this practice is the Verbal Numerical Rating Scale (VNRS-11). This is an 11-point scale. It is for adults and children 10 years or older. There are significant differences in how the pain score is reported, used, and applied. Forget everything you learned in the past and learn this scoring system.  General Information: The scale should reflect your current level of pain. Unless you are specifically asked for the level of your worst pain, or your average pain. If you are asked for one of these two, then it should be understood that it is over the past 24 hours.  Basic Activities of Daily Living (ADL): Personal hygiene, dressing, eating, transferring, and using restroom.  Instructions: Most patients tend to report their level of pain as a combination of two factors, their physical pain and their psychosocial pain. This last one is also known as "suffering" and it is reflection of how physical pain affects you socially and psychologically. From now on, report them separately. From this point on, when asked to report your pain level, report only your physical pain. Use the following table for reference.  Pain Clinic Pain Levels (0-5/10)  Pain Level Score  Description  No Pain 0   Mild pain 1 Nagging, annoying, but does not interfere with basic activities of daily living (ADL). Patients are able to eat, bathe, get dressed, toileting (being able to get on and off the toilet and perform personal hygiene functions), transfer (move in and out of bed or a chair without assistance), and maintain continence (able to control bladder and bowel functions). Blood pressure and heart rate are unaffected. A normal heart rate for a healthy adult ranges from 60 to 100 bpm (beats per minute).   Mild to moderate pain 2 Noticeable and distracting. Impossible to hide from other  people. More frequent flare-ups. Still possible to adapt and function close to normal. It can be very annoying and may have occasional stronger flare-ups. With discipline, patients may get used to it and adapt.   Moderate pain 3 Interferes significantly with activities of daily living (ADL). It becomes difficult to feed, bathe, get dressed, get on and off the toilet or to perform personal hygiene functions. Difficult to get in and out of bed or a chair without assistance. Very distracting. With effort, it can be ignored when deeply involved in activities.   Moderately severe pain 4 Impossible to ignore for more than a few minutes. With effort, patients may still be able to manage work or participate in some social activities. Very difficult to concentrate. Signs of autonomic nervous system discharge are evident: dilated pupils (mydriasis); mild sweating (diaphoresis); sleep interference. Heart rate becomes elevated (>115 bpm). Diastolic blood pressure (lower number) rises above 100 mmHg. Patients find relief in laying down and not moving.   Severe pain 5 Intense and extremely unpleasant. Associated with frowning face and frequent crying. Pain overwhelms the senses.  Ability to do any activity or maintain social relationships becomes significantly limited. Conversation becomes difficult. Pacing back and forth is common, as getting into a comfortable position is nearly impossible. Pain wakes you up from deep sleep. Physical signs will be obvious: pupillary dilation; increased sweating; goosebumps; brisk reflexes; cold, clammy hands and feet; nausea, vomiting or dry heaves; loss of appetite; significant sleep disturbance with inability to fall asleep or to remain asleep. When persistent, significant weight loss is observed due to the complete loss of appetite and sleep deprivation.  Blood   pressure and heart rate becomes significantly elevated. Caution: If elevated blood pressure triggers a pounding headache  associated with blurred vision, then the patient should immediately seek attention at an urgent or emergency care unit, as these may be signs of an impending stroke.    Emergency Department Pain Levels (6-10/10)  Emergency Room Pain 6 Severely limiting. Requires emergency care and should not be seen or managed at an outpatient pain management facility. Communication becomes difficult and requires great effort. Assistance to reach the emergency department may be required. Facial flushing and profuse sweating along with potentially dangerous increases in heart rate and blood pressure will be evident.   Distressing pain 7 Self-care is very difficult. Assistance is required to transport, or use restroom. Assistance to reach the emergency department will be required. Tasks requiring coordination, such as bathing and getting dressed become very difficult.   Disabling pain 8 Self-care is no longer possible. At this level, pain is disabling. The individual is unable to do even the most "basic" activities such as walking, eating, bathing, dressing, transferring to a bed, or toileting. Fine motor skills are lost. It is difficult to think clearly.   Incapacitating pain 9 Pain becomes incapacitating. Thought processing is no longer possible. Difficult to remember your own name. Control of movement and coordination are lost.   The worst pain imaginable 10 At this level, most patients pass out from pain. When this level is reached, collapse of the autonomic nervous system occurs, leading to a sudden drop in blood pressure and heart rate. This in turn results in a temporary and dramatic drop in blood flow to the brain, leading to a loss of consciousness. Fainting is one of the body's self defense mechanisms. Passing out puts the brain in a calmed state and causes it to shut down for a while, in order to begin the healing process.    Summary: 1. Refer to this scale when providing [place] with your pain level. 2. Be  accurate and careful when reporting your pain level. This will help with your care. 3. Over-reporting your pain level will lead to loss of credibility. 4. Even a level of 1/10 means that there is pain and will be treated at our facility. 5. High, inaccurate reporting will be documented as "Symptom Exaggeration", leading to loss of credibility and suspicions of possible secondary gains such as obtaining more narcotics, or wanting to appear disabled, for fraudulent reasons. 6. Only pain levels of 5 or below will be seen at our facility. 7. Pain levels of 6 and above will be sent to the Emergency Department and the appointment cancelled. ____________________________________________________________________________________________   ____________________________________________________________________________________________  General Risks and Possible Complications  Patient Responsibilities: It is important that you read this as it is part of your informed consent. It is our duty to inform you of the risks and possible complications associated with treatments offered to you. It is your responsibility as a patient to read this and to ask questions about anything that is not clear or that you believe was not covered in this document.  Patient's Rights: You have the right to refuse treatment. You also have the right to change your mind, even after initially having agreed to have the treatment done. However, under this last option, if you wait until the last second to change your mind, you may be charged for the materials used up to that point.  Introduction: Medicine is not an Chief Strategy Officer. Everything in Medicine, including the lack of treatment(s), carries the  potential for danger, harm, or loss (which is by definition: Risk). In Medicine, a complication is a secondary problem, condition, or disease that can aggravate an already existing one. All treatments carry the risk of possible complications. The  fact that a side effects or complications occurs, does not imply that the treatment was conducted incorrectly. It must be clearly understood that these can happen even when everything is done following the highest safety standards.  No treatment: You can choose not to proceed with the proposed treatment alternative. The "PRO(s)" would include: avoiding the risk of complications associated with the therapy. The "CON(s)" would include: not getting any of the treatment benefits. These benefits fall under one of three categories: diagnostic; therapeutic; and/or palliative. Diagnostic benefits include: getting information which can ultimately lead to improvement of the disease or symptom(s). Therapeutic benefits are those associated with the successful treatment of the disease. Finally, palliative benefits are those related to the decrease of the primary symptoms, without necessarily curing the condition (example: decreasing the pain from a flare-up of a chronic condition, such as incurable terminal cancer).  General Risks and Complications: These are associated to most interventional treatments. They can occur alone, or in combination. They fall under one of the following six (6) categories: no benefit or worsening of symptoms; bleeding; infection; nerve damage; allergic reactions; and/or death. 1. No benefits or worsening of symptoms: In Medicine there are no guarantees, only probabilities. No healthcare provider can ever guarantee that a medical treatment will work, they can only state the probability that it may. Furthermore, there is always the possibility that the condition may worsen, either directly, or indirectly, as a consequence of the treatment. 2. Bleeding: This is more common if the patient is taking a blood thinner, either prescription or over the counter (example: Goody Powders, Fish oil, Aspirin, Garlic, etc.), or if suffering a condition associated with impaired coagulation (example: Hemophilia,  cirrhosis of the liver, low platelet counts, etc.). However, even if you do not have one on these, it can still happen. If you have any of these conditions, or take one of these drugs, make sure to notify your treating physician. 3. Infection: This is more common in patients with a compromised immune system, either due to disease (example: diabetes, cancer, human immunodeficiency virus [HIV], etc.), or due to medications or treatments (example: therapies used to treat cancer and rheumatological diseases). However, even if you do not have one on these, it can still happen. If you have any of these conditions, or take one of these drugs, make sure to notify your treating physician. 4. Nerve Damage: This is more common when the treatment is an invasive one, but it can also happen with the use of medications, such as those used in the treatment of cancer. The damage can occur to small secondary nerves, or to large primary ones, such as those in the spinal cord and brain. This damage may be temporary or permanent and it may lead to impairments that can range from temporary numbness to permanent paralysis and/or brain death. 5. Allergic Reactions: Any time a substance or material comes in contact with our body, there is the possibility of an allergic reaction. These can range from a mild skin rash (contact dermatitis) to a severe systemic reaction (anaphylactic reaction), which can result in death. 6. Death: In general, any medical intervention can result in death, most of the time due to an unforeseen complication. ____________________________________________________________________________________________  ____________________________________________________________________________________________  Medication Rules  Applies to: All patients receiving  prescriptions (written or electronic).  Pharmacy of record: Pharmacy where electronic prescriptions will be sent. If written prescriptions are taken to a  different pharmacy, please inform the nursing staff. The pharmacy listed in the electronic medical record should be the one where you would like electronic prescriptions to be sent.  Prescription refills: Only during scheduled appointments. Applies to both, written and electronic prescriptions.  NOTE: The following applies primarily to controlled substances (Opioid* Pain Medications).   Patient's responsibilities: 1. Pain Pills: Bring all pain pills to every appointment (except for procedure appointments). 2. Pill Bottles: Bring pills in original pharmacy bottle. Always bring newest bottle. Bring bottle, even if empty. 3. Medication refills: You are responsible for knowing and keeping track of what medications you need refilled. The day before your appointment, write a list of all prescriptions that need to be refilled. Bring that list to your appointment and give it to the admitting nurse. Prescriptions will be written only during appointments. If you forget a medication, it will not be "Called in", "Faxed", or "electronically sent". You will need to get another appointment to get these prescribed. 4. Prescription Accuracy: You are responsible for carefully inspecting your prescriptions before leaving our office. Have the discharge nurse carefully go over each prescription with you, before taking them home. Make sure that your name is accurately spelled, that your address is correct. Check the name and dose of your medication to make sure it is accurate. Check the number of pills, and the written instructions to make sure they are clear and accurate. Make sure that you are given enough medication to last until your next medication refill appointment. 5. Taking Medication: Take medication as prescribed. Never take more pills than instructed. Never take medication more frequently than prescribed. Taking less pills or less frequently is permitted and encouraged, when it comes to controlled substances  (written prescriptions).  6. Inform other Doctors: Always inform, all of your healthcare providers, of all the medications you take. 7. Pain Medication from other Providers: You are not allowed to accept any additional pain medication from any other Doctor or Healthcare provider. There are two exceptions to this rule. (see below) In the event that you require additional pain medication, you are responsible for notifying us, as stated below. 8. Medication Agreement: You are responsible for carefully reading and following our Medication Agreement. This must be signed before receiving any prescriptions from our practice. Safely store a copy of your signed Agreement. Violations to the Agreement will result in no further prescriptions. (Additional copies of our Medication Agreement are available upon request.) 9. Laws, Rules, & Regulations: All patients are expected to follow all Federal and [employer], [employer], Rules, [employer]. Ignorance of the Laws does not constitute a valid excuse. The use of any illegal substances is prohibited. 10. Adopted CDC guidelines & recommendations: Target dosing levels will be at or below 60 MME/day. Use of benzodiazepines** is not recommended.  Exceptions: There are only two exceptions to the rule of not receiving pain medications from other Healthcare Providers. 1. Exception #1 (Emergencies): In the event of an emergency (i.e.: accident requiring emergency care), you are allowed to receive additional pain medication. However, you are responsible for: As soon as you are able, call our office [phone], at any time of the day or night, and leave a message stating your name, the date and nature of the emergency, and the name and dose of the medication prescribed. In the event that your call is answered by  a member of our staff, make sure to document and save the date, time, and the name of the person that took your information.  2. Exception #2 (Planned Surgery): In the  event that you are scheduled by another doctor or dentist to have any type of surgery or procedure, you are allowed (for a period no longer than 30 days), to receive additional pain medication, for the acute post-op pain. However, in this case, you are responsible for picking up a copy of our "Post-op Pain Management for Surgeons" handout, and giving it to your surgeon or dentist. This document is available at our office, and does not require an appointment to obtain it. Simply go to our office during business hours (Monday-Thursday from 8:00 AM to 4:00 PM) (Friday 8:00 AM to 12:00 Noon) or if you have a scheduled appointment with [place], prior to your surgery, and ask for it by name. In addition, you will need to provide [place] with your name, name of your surgeon, type of surgery, and date of procedure or surgery.  *Opioid medications include: morphine, codeine, oxycodone, oxymorphone, hydrocodone, hydromorphone, meperidine, tramadol, tapentadol, buprenorphine, fentanyl, methadone. **Benzodiazepine medications include: diazepam (Valium), alprazolam (Xanax), clonazepam (Klonopine), lorazepam (Ativan), clorazepate (Tranxene), chlordiazepoxide (Librium), estazolam (Prosom), oxazepam (Serax), temazepam (Restoril), triazolam (Halcion) (Last updated: 03/08/2017) ____________________________________________________________________________________________   ____________________________________________________________________________________________  Medication Recommendations and Reminders  Applies to: All patients receiving prescriptions (written and/or electronic).  Medication Rules & Regulations: These rules and regulations exist for your safety and that of others. They are not flexible and neither are we. Dismissing or ignoring them will be considered "non-compliance" with medication therapy, resulting in complete and irreversible termination of such therapy. (See document titled "Medication Rules" for more  details.) In all conscience, because of safety reasons, we cannot continue providing a therapy where the patient does not follow instructions.  Pharmacy of record:   Definition: This is the pharmacy where your electronic prescriptions will be sent.   We do not endorse any particular pharmacy.  You are not restricted in your choice of pharmacy.  The pharmacy listed in the electronic medical record should be the one where you want electronic prescriptions to be sent.  If you choose to change pharmacy, simply notify our nursing staff of your choice of new pharmacy.  Recommendations:  Keep all of your pain medications in a safe place, under lock and key, even if you live alone.   After you fill your prescription, take 1 week's worth of pills and put them away in a safe place. You should keep a separate, properly labeled bottle for this purpose. The remainder should be kept in the original bottle. Use this as your primary supply, until it runs out. Once it's gone, then you know that you have 1 week's worth of medicine, and it is time to come in for a prescription refill. If you do this correctly, it is unlikely that you will ever run out of medicine.  To make sure that the above recommendation works, it is very important that you make sure your medication refill appointments are scheduled at least 1 week before you run out of medicine. To do this in an effective manner, make sure that you do not leave the office without scheduling your next medication management appointment. Always ask the nursing staff to show you in your prescription , when your medication will be running out. Then arrange for the receptionist to get you a return appointment, at least 7 days before you run out of  medicine. Do not wait until you have 1 or 2 pills left, to come in. This is very poor planning and does not take into consideration that we may need to cancel appointments due to bad weather, sickness, or emergencies  affecting our staff.  "Partial Fill": If for any reason your pharmacy does not have enough pills/tablets to completely fill or refill your prescription, do not allow for a "partial fill". You will need a separate prescription to fill the remaining amount, which we will not provide. If the reason for the partial fill is your insurance, you will need to talk to the pharmacist about payment alternatives for the remaining tablets, but again, do not accept a partial fill.  Prescription refills and/or changes in medication(s):   Prescription refills, and/or changes in dose or medication, will be conducted only during scheduled medication management appointments. (Applies to both, written and electronic prescriptions.)  No refills on procedure days. No medication will be changed or started on procedure days. No changes, adjustments, and/or refills will be conducted on a procedure day. Doing so will interfere with the diagnostic portion of the procedure.  No phone refills. No medications will be "called into the pharmacy".  No Fax refills.  No weekend refills.  No Holliday refills.  No after hours refills.  Remember:  Business hours are:  Monday to Thursday 8:00 AM to 4:00 PM Provider's Schedule: Dionisio David, NP - Appointments are:  Medication management: Monday to Thursday 8:00 AM to 4:00 PM Milinda Pointer, MD - Appointments are:  Medication management: Monday and Wednesday 8:00 AM to 4:00 PM Procedure day: Tuesday and Thursday 7:30 AM to 4:00 PM Gillis Santa, MD - Appointments are:  Medication management: Tuesday and Thursday 8:00 AM to 4:00 PM Procedure day: Monday and Wednesday 7:30 AM to 4:00 PM (Last update: 03/08/2017) ____________________________________________________________________________________________   ____________________________________________________________________________________________  CANNABIDIOL (AKA: CBD Oil or Pills)  Applies to: All patients  receiving prescriptions of controlled substances (written and/or electronic).  General Information: Cannabidiol (CBD) was discovered in 62. It is one of some 113 identified cannabinoids in cannabis (Marijuana) plants, accounting for up to 40% of the plant's extract. As of 2018, preliminary clinical research on cannabidiol included studies of anxiety, cognition, movement disorders, and pain.  Cannabidiol is consummed in multiple ways, including inhalation of cannabis smoke or vapor, as an aerosol spray into the cheek, and by mouth. It may be supplied as CBD oil containing CBD as the active ingredient (no added tetrahydrocannabinol (THC) or terpenes), a full-plant CBD-dominant hemp extract oil, capsules, dried cannabis, or as a liquid solution. CBD is thought not have the same psychoactivity as THC, and may affect the actions of THC. Studies suggest that CBD may interact with different biological targets, including cannabinoid receptors and other neurotransmitter receptors. As of 2018 the mechanism of action for its biological effects has not been determined.  In the [place], cannabidiol has a limited approval by the Food and Drug Administration (FDA) for treatment of only two types of epilepsy disorders. The side effects of long-term use of the drug include somnolence, decreased appetite, diarrhea, fatigue, malaise, weakness, sleeping problems, and others.  CBD remains a Schedule I drug prohibited for any use.  Legality: Some manufacturers ship CBD products nationally, an illegal action which the FDA has not enforced in 2018, with CBD remaining the subject of an FDA investigational new drug evaluation, and is not considered legal as a dietary supplement or food ingredient as of December 2018. Federal illegality has made  it difficult historically to conduct research on CBD. CBD is openly sold in head shops and health food stores in some states where such sales have not been explicitly  legalized.  Warning: Because it is not FDA approved for general use or treatment of pain, it is not required to undergo the same manufacturing controls as prescription drugs.  This means that the available cannabidiol (CBD) may be contaminated with THC.  If this is the case, it will trigger a positive urine drug screen (UDS) test for cannabinoids (Marijuana).  Because a positive UDS for illicit substances is a violation of our medication agreement, your opioid analgesics (pain medicine) may be permanently discontinued. (Last update: 03/29/2017) ____________________________________________________________________________________________   ____________________________________________________________________________________________  Preparing for Procedure with Sedation  Instructions: . Oral Intake: Do not eat or drink anything for at least 8 hours prior to your procedure. . Transportation: Public transportation is not allowed. Bring an adult driver. The driver must be physically present in our waiting room before any procedure can be started. Marland Kitchen Physical Assistance: Bring an adult physically capable of assisting you, in the event you need help. This adult should keep you company at home for at least 6 hours after the procedure. . Blood Pressure Medicine: Take your blood pressure medicine with a sip of water the morning of the procedure. . Blood thinners: Notify our staff if you are taking any blood thinners. Depending on which one you take, there will be specific instructions on how and when to stop it. . Diabetics on insulin: Notify the staff so that you can be scheduled 1st case in the morning. If your diabetes requires high dose insulin, take only  of your normal insulin dose the morning of the procedure and notify the staff that you have done so. . Preventing infections: Shower with an antibacterial soap the morning of your procedure. . Build-up your immune system: Take 1000 mg of Vitamin C with  every meal (3 times a day) the day prior to your procedure. Marland Kitchen Antibiotics: Inform the staff if you have a condition or reason that requires you to take antibiotics before dental procedures. . Pregnancy: If you are pregnant, call and cancel the procedure. . Sickness: If you have a cold, fever, or any active infections, call and cancel the procedure. . Arrival: You must be in the facility at least 30 minutes prior to your scheduled procedure. . Children: Do not bring children with you. . Dress appropriately: Bring dark clothing that you would not mind if they get stained. . Valuables: Do not bring any jewelry or valuables.  Procedure appointments are reserved for interventional treatments only. Marland Kitchen No Prescription Refills. . No medication changes will be discussed during procedure appointments. . No disability issues will be discussed.  Reasons to call and reschedule or cancel your procedure: (Following these recommendations will minimize the risk of a serious complication.) . Surgeries: Avoid having procedures within 2 weeks of any surgery. (Avoid for 2 weeks before or after any surgery). . Flu Shots: Avoid having procedures within 2 weeks of a flu shots or . (Avoid for 2 weeks before or after immunizations). . Barium: Avoid having a procedure within 7-10 days after having had a radiological study involving the use of radiological contrast. (Myelograms, Barium swallow or enema study). . Heart attacks: Avoid any elective procedures or surgeries for the initial 6 months after a "Myocardial Infarction" (Heart Attack). . Blood thinners: It is imperative that you stop these medications before procedures. Let us know if you  if you take any blood thinner.  . Infection: Avoid procedures during or within two weeks of an infection (including chest colds or gastrointestinal problems). Symptoms associated with infections include: Localized redness, fever, chills, night sweats or profuse sweating, burning  sensation when voiding, cough, congestion, stuffiness, runny nose, sore throat, diarrhea, nausea, vomiting, cold or Flu symptoms, recent or current infections. It is specially important if the infection is over the area that we intend to treat. Marland Kitchen Heart and lung problems: Symptoms that may suggest an active cardiopulmonary problem include: cough, chest pain, breathing difficulties or shortness of breath, dizziness, ankle swelling, uncontrolled high or unusually low blood pressure, and/or palpitations. If you are experiencing any of these symptoms, cancel your procedure and contact your primary care physician for an evaluation.  Remember:  Regular Business hours are:  Monday to Thursday 8:00 AM to 4:00 PM  Provider's Schedule: Milinda Pointer, MD:  Procedure days: Tuesday and Thursday 7:30 AM to 4:00 PM  Gillis Santa, MD:  Procedure days: Monday and Wednesday 7:30 AM to 4:00 PM ____________________________________________________________________________________________

## 2017-09-27 DIAGNOSIS — E790 Hyperuricemia without signs of inflammatory arthritis and tophaceous disease: Secondary | ICD-10-CM | POA: Insufficient documentation

## 2017-09-27 DIAGNOSIS — M4802 Spinal stenosis, cervical region: Secondary | ICD-10-CM | POA: Insufficient documentation

## 2017-09-27 DIAGNOSIS — M5030 Other cervical disc degeneration, unspecified cervical region: Secondary | ICD-10-CM | POA: Insufficient documentation

## 2017-09-27 DIAGNOSIS — M1A00X0 Idiopathic chronic gout, unspecified site, without tophus (tophi): Secondary | ICD-10-CM | POA: Insufficient documentation

## 2017-09-27 LAB — URIC ACID: Uric Acid: 7.8 mg/dL — ABNORMAL HIGH (ref 2.5–7.1)

## 2017-09-27 LAB — URIC ACID, RANDOM URINE: URIC ACID UR: 22.8 mg/dL

## 2017-09-27 LAB — RHEUMATOID FACTOR: RHEUMATOID FACTOR: 10.6 [IU]/mL (ref 0.0–13.9)

## 2017-09-27 LAB — ANA W/REFLEX IF POSITIVE: Anti Nuclear Antibody(ANA): NEGATIVE

## 2017-10-09 ENCOUNTER — Ambulatory Visit (HOSPITAL_BASED_OUTPATIENT_CLINIC_OR_DEPARTMENT_OTHER): Payer: Medicare Other | Admitting: Pain Medicine

## 2017-10-09 ENCOUNTER — Encounter: Payer: Self-pay | Admitting: Pain Medicine

## 2017-10-09 ENCOUNTER — Other Ambulatory Visit: Payer: Self-pay

## 2017-10-09 ENCOUNTER — Ambulatory Visit
Admission: RE | Admit: 2017-10-09 | Discharge: 2017-10-09 | Disposition: A | Discharge status: Home / Self Care | Payer: Medicare Other | Source: Ambulatory Visit | Attending: Pain Medicine | Admitting: Pain Medicine

## 2017-10-09 VITALS — BP 119/71 | HR 77 | Temp 97.6°F | Resp 18 | Ht 65.0 in | Wt 207.0 lb

## 2017-10-09 DIAGNOSIS — M5442 Lumbago with sciatica, left side: Secondary | ICD-10-CM | POA: Diagnosis present

## 2017-10-09 DIAGNOSIS — M5136 Other intervertebral disc degeneration, lumbar region: Secondary | ICD-10-CM | POA: Diagnosis not present

## 2017-10-09 DIAGNOSIS — Z91040 Latex allergy status: Secondary | ICD-10-CM | POA: Insufficient documentation

## 2017-10-09 DIAGNOSIS — Z880 Allergy status to penicillin: Secondary | ICD-10-CM | POA: Diagnosis not present

## 2017-10-09 DIAGNOSIS — G8929 Other chronic pain: Secondary | ICD-10-CM | POA: Diagnosis not present

## 2017-10-09 DIAGNOSIS — M5441 Lumbago with sciatica, right side: Secondary | ICD-10-CM | POA: Insufficient documentation

## 2017-10-09 DIAGNOSIS — B182 Chronic viral hepatitis C: Secondary | ICD-10-CM

## 2017-10-09 DIAGNOSIS — Z885 Allergy status to narcotic agent status: Secondary | ICD-10-CM | POA: Insufficient documentation

## 2017-10-09 DIAGNOSIS — Z79899 Other long term (current) drug therapy: Secondary | ICD-10-CM | POA: Insufficient documentation

## 2017-10-09 DIAGNOSIS — M51369 Other intervertebral disc degeneration, lumbar region without mention of lumbar back pain or lower extremity pain: Secondary | ICD-10-CM

## 2017-10-09 DIAGNOSIS — M1288 Other specific arthropathies, not elsewhere classified, other specified site: Secondary | ICD-10-CM | POA: Insufficient documentation

## 2017-10-09 DIAGNOSIS — Z882 Allergy status to sulfonamides status: Secondary | ICD-10-CM | POA: Insufficient documentation

## 2017-10-09 DIAGNOSIS — M47816 Spondylosis without myelopathy or radiculopathy, lumbar region: Secondary | ICD-10-CM

## 2017-10-09 MED ORDER — TRIAMCINOLONE ACETONIDE 40 MG/ML IJ SUSP
80.0000 mg | Freq: Once | INTRAMUSCULAR | Status: AC
  Administered 2017-10-09: 80 mg
  Filled 2017-10-09: qty 2

## 2017-10-09 MED ORDER — ROPIVACAINE HCL 2 MG/ML IJ SOLN
18.0000 mL | Freq: Once | INTRAMUSCULAR | Status: AC
  Administered 2017-10-09: 18 mL via PERINEURAL
  Filled 2017-10-09: qty 20

## 2017-10-09 MED ORDER — LIDOCAINE HCL 2 % IJ SOLN
20.0000 mL | Freq: Once | INTRAMUSCULAR | Status: AC
  Administered 2017-10-09: 400 mg
  Filled 2017-10-09: qty 40

## 2017-10-09 MED ORDER — LACTATED RINGERS IV SOLN
1000.0000 mL | Freq: Once | INTRAVENOUS | Status: AC
  Administered 2017-10-09: 1000 mL via INTRAVENOUS

## 2017-10-09 MED ORDER — MIDAZOLAM HCL 5 MG/5ML IJ SOLN
1.0000 mg | INTRAMUSCULAR | Status: DC | PRN
  Administered 2017-10-09: 3 mg via INTRAVENOUS
  Filled 2017-10-09: qty 5

## 2017-10-09 MED ORDER — FENTANYL CITRATE (PF) 100 MCG/2ML IJ SOLN
25.0000 ug | INTRAMUSCULAR | Status: DC | PRN
  Administered 2017-10-09: 50 ug via INTRAVENOUS
  Filled 2017-10-09: qty 2

## 2017-10-09 NOTE — Progress Notes (Signed)
Patient's Name: Rachael West  MRN: [id number]  Referring Provider: Esaw Grandchild, NP  DOB: Jan 21, 1955  PCP: Esaw Grandchild, NP  DOS: 10/09/2017  Note by: Gaspar Cola, MD  Service setting: Ambulatory outpatient  Specialty: Interventional Pain Management  Patient type: Established  Location: [facility] (AMB) Pain Management Facility  Visit type: Interventional Procedure   Primary Reason for Visit: Interventional Pain Management Treatment. CC: Back Pain (lower)  Procedure:          Anesthesia, Analgesia, Anxiolysis:  Type: Lumbar Facet, Medial Branch Block(s) #1  Primary Purpose: Diagnostic Region: Posterolateral Lumbosacral Spine Level: L2, L3, L4, L5, & S1 Medial Branch Level(s). Injecting these levels blocks the L3-4, L4-5, and L5-S1 lumbar facet joints. Laterality: Bilateral  Type: Moderate (Conscious) Sedation combined with Local Anesthesia Indication(s): Analgesia and Anxiety Route: Intravenous (IV) IV Access: Secured Sedation: Meaningful verbal contact was maintained at all times during the procedure  Local Anesthetic: Lidocaine 1-2%  Position: Prone   Indications: 1. Spondylosis without myelopathy or radiculopathy, lumbar region   2. Lumbar facet syndrome (Bilateral) (L>R)   3. Lumbar facet arthropathy   4. DDD (degenerative disc disease), lumbar   5. Chronic low back pain (Primary Area of Pain) (Bilateral) w/ sciatica (Left)   6. 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 Latex precautions, history of latex allergy    Pain Score: Pre-procedure: 2 /10 Post-procedure: 0-No pain/10  Pre-op Assessment:  Rachael West is a 62 y.o. (year old), female patient, seen today for interventional treatment. She  has a past surgical history that includes Appendectomy; Trigger finger release; Esophagogastroduodenoscopy (01/17/2011); Colonoscopy (01/17/2011); Abdominal hysterectomy; and Breast biopsy (Left, 2015). Rachael West has a current medication list which  includes the following prescription(s): albuterol, atenolol, buspirone, gnp calcium 1200, vitamin d3, cyclobenzaprine, ergocalciferol, hydroxyzine, ipratropium-albuterol, linaclotide, lisinopril, magnesium, meloxicam, montelukast, omeprazole, paliperidone, polyethylene glycol, prazosin, sertraline, tramadol, trazodone, vesicare, and tramadol, and the following Facility-Administered Medications: fentanyl and midazolam. Her primarily concern today is the Back Pain (lower)  Initial Vital Signs:  Pulse/HCG Rate: 71ECG Heart Rate: 66 Temp: 98.4 F (36.9 C) Resp: 16 BP: 103/70 SpO2: 99 %  BMI: Estimated body mass index is 34.45 kg/m as calculated from the following:   Height as of this encounter: 5\' 5"  (1.651 m).   Weight as of this encounter: 207 lb (93.9 kg).  Risk Assessment: Allergies: Reviewed. She is allergic to latex; penicillins; codeine; cortisone; and sulfa antibiotics.  Allergy Precautions: Latex-free protocol activated Coagulopathies: Reviewed. None identified.  Blood-thinner therapy: None at this time Active Infection(s): Reviewed. None identified. Rachael West is afebrile  Site Confirmation: Rachael West was asked to confirm the procedure and laterality before marking the site Procedure checklist: Completed Consent: Before the procedure and under the influence of no sedative(s), amnesic(s), or anxiolytics, the patient was informed of the treatment options, risks and possible complications. To fulfill our ethical and legal obligations, as recommended by the American Medical Association's Code of Ethics, I have informed the patient of my clinical impression; the nature and purpose of the treatment or procedure; the risks, benefits, and possible complications of the intervention; the alternatives, including doing nothing; the risk(s) and benefit(s) of the alternative treatment(s) or procedure(s); and the risk(s) and benefit(s) of doing nothing. The patient was provided  information about the general risks and possible complications associated with the procedure. These may include, but are not limited to: failure to achieve desired goals, infection, bleeding, organ or nerve damage, allergic reactions, paralysis, and  death. In addition, the patient was informed of those risks and complications associated to Spine-related procedures, such as failure to decrease pain; infection (i.e.: Meningitis, epidural or intraspinal abscess); bleeding (i.e.: epidural hematoma, subarachnoid hemorrhage, or any other type of intraspinal or peri-dural bleeding); organ or nerve damage (i.e.: Any type of peripheral nerve, nerve root, or spinal cord injury) with subsequent damage to sensory, motor, and/or autonomic systems, resulting in permanent pain, numbness, and/or weakness of one or several areas of the body; allergic reactions; (i.e.: anaphylactic reaction); and/or death. Furthermore, the patient was informed of those risks and complications associated with the medications. These include, but are not limited to: allergic reactions (i.e.: anaphylactic or anaphylactoid reaction(s)); adrenal axis suppression; blood sugar elevation that in diabetics may result in ketoacidosis or comma; water retention that in patients with history of congestive heart failure may result in shortness of breath, pulmonary edema, and decompensation with resultant heart failure; weight gain; swelling or edema; medication-induced neural toxicity; particulate matter embolism and blood vessel occlusion with resultant organ, and/or nervous system infarction; and/or aseptic necrosis of one or more joints. Finally, the patient was informed that Medicine is not an exact science; therefore, there is also the possibility of unforeseen or unpredictable risks and/or possible complications that may result in a catastrophic outcome. The patient indicated having understood very clearly. We have given the patient no guarantees and we  have made no promises. Enough time was given to the patient to ask questions, all of which were answered to the patient's satisfaction. Rachael West has indicated that she wanted to continue with the procedure. Attestation: I, the ordering provider, attest that I have discussed with the patient the benefits, risks, side-effects, alternatives, likelihood of achieving goals, and potential problems during recovery for the procedure that I have provided informed consent. Date  Time: 10/09/2017  8:29 AM  Pre-Procedure Preparation:  Monitoring: As per clinic protocol. Respiration, ETCO2, SpO2, BP, heart rate and rhythm monitor placed and checked for adequate function Safety Precautions: Patient was assessed for positional comfort and pressure points before starting the procedure. Time-out: I initiated and conducted the "Time-out" before starting the procedure, as per protocol. The patient was asked to participate by confirming the accuracy of the "Time Out" information. Verification of the correct person, site, and procedure were performed and confirmed by me, the nursing staff, and the patient. "Time-out" conducted as per Joint Commission's Universal Protocol (UP.01.01.01). Time: 0932  Description of Procedure:          Laterality: Bilateral. The procedure was performed in identical fashion on both sides. Levels:  L2, L3, L4, L5, & S1 Medial Branch Level(s) Area Prepped: Posterior Lumbosacral Region Prepping solution: ChloraPrep (2% chlorhexidine gluconate and 70% isopropyl alcohol) Safety Precautions: Aspiration looking for blood return was conducted prior to all injections. At no point did we inject any substances, as a needle was being advanced. Before injecting, the patient was told to immediately notify me if she was experiencing any new onset of "ringing in the ears, or metallic taste in the mouth". No attempts were made at seeking any paresthesias. Safe injection practices and needle disposal  techniques used. Medications properly checked for expiration dates. SDV (single dose vial) medications used. After the completion of the procedure, all disposable equipment used was discarded in the proper designated medical waste containers. Local Anesthesia: Protocol guidelines were followed. The patient was positioned over the fluoroscopy table. The area was prepped in the usual manner. The time-out was completed. The target area was  identified using fluoroscopy. A 12-in long, straight, sterile hemostat was used with fluoroscopic guidance to locate the targets for each level blocked. Once located, the skin was marked with an approved surgical skin marker. Once all sites were marked, the skin (epidermis, dermis, and hypodermis), as well as deeper tissues (fat, connective tissue and muscle) were infiltrated with a small amount of a short-acting local anesthetic, loaded on a 10cc syringe with a 25G, 1.5-in  Needle. An appropriate amount of time was allowed for local anesthetics to take effect before proceeding to the next step. Local Anesthetic: Lidocaine 2.0% The unused portion of the local anesthetic was discarded in the proper designated containers. Technical explanation of process:  L2 Medial Branch Nerve Block (MBB): The target area for the L2 medial branch is at the junction of the postero-lateral aspect of the superior articular process and the superior, posterior, and medial edge of the transverse process of L3. Under fluoroscopic guidance, a Quincke needle was inserted until contact was made with os over the superior postero-lateral aspect of the pedicular shadow (target area). After negative aspiration for blood, 0.5 mL of the nerve block solution was injected without difficulty or complication. The needle was removed intact. L3 Medial Branch Nerve Block (MBB): The target area for the L3 medial branch is at the junction of the postero-lateral aspect of the superior articular process and the superior,  posterior, and medial edge of the transverse process of L4. Under fluoroscopic guidance, a Quincke needle was inserted until contact was made with os over the superior postero-lateral aspect of the pedicular shadow (target area). After negative aspiration for blood, 0.5 mL of the nerve block solution was injected without difficulty or complication. The needle was removed intact. L4 Medial Branch Nerve Block (MBB): The target area for the L4 medial branch is at the junction of the postero-lateral aspect of the superior articular process and the superior, posterior, and medial edge of the transverse process of L5. Under fluoroscopic guidance, a Quincke needle was inserted until contact was made with os over the superior postero-lateral aspect of the pedicular shadow (target area). After negative aspiration for blood, 0.5 mL of the nerve block solution was injected without difficulty or complication. The needle was removed intact. L5 Medial Branch Nerve Block (MBB): The target area for the L5 medial branch is at the junction of the postero-lateral aspect of the superior articular process and the superior, posterior, and medial edge of the sacral ala. Under fluoroscopic guidance, a Quincke needle was inserted until contact was made with os over the superior postero-lateral aspect of the pedicular shadow (target area). After negative aspiration for blood, 0.5 mL of the nerve block solution was injected without difficulty or complication. The needle was removed intact. S1 Medial Branch Nerve Block (MBB): The target area for the S1 medial branch is at the posterior and inferior 6 o'clock position of the L5-S1 facet joint. Under fluoroscopic guidance, the Quincke needle inserted for the L5 MBB was redirected until contact was made with os over the inferior and postero aspect of the sacrum, at the 6 o' clock position under the L5-S1 facet joint (Target area). After negative aspiration for blood, 0.5 mL of the nerve block  solution was injected without difficulty or complication. The needle was removed intact. Procedural Needles: 22-gauge, 3.5-inch, Quincke needles used for all levels. Nerve block solution: 0.2% PF-Ropivacaine + Triamcinolone (40 mg/mL) diluted to a final concentration of 4 mg of Triamcinolone/mL of Ropivacaine The unused portion of the solution  was discarded in the proper designated containers.  Once the entire procedure was completed, the treated area was cleaned, making sure to leave some of the prepping solution back to take advantage of its long term bactericidal properties.   Illustration of the posterior view of the lumbar spine and the posterior neural structures. Laminae of L2 through S1 are labeled. DPRL5, dorsal primary ramus of L5; DPRS1, dorsal primary ramus of S1; DPR3, dorsal primary ramus of L3; FJ, facet (zygapophyseal) joint L3-L4; I, inferior articular process of L4; LB1, lateral branch of dorsal primary ramus of L1; IAB, inferior articular branches from L3 medial branch (supplies L4-L5 facet joint); IBP, intermediate branch plexus; MB3, medial branch of dorsal primary ramus of L3; NR3, third lumbar nerve root; S, superior articular process of L5; SAB, superior articular branches from L4 (supplies L4-5 facet joint also); TP3, transverse process of L3.  Vitals:   10/09/17 0945 10/09/17 0955 10/09/17 1005 10/09/17 1015  BP: 101/71 115/63 113/62 119/71  Pulse:  68 74 77  Resp: 14 13 20 18   Temp:  97.6 F (36.4 C)  97.6 F (36.4 C)  TempSrc:  Tympanic  Tympanic  SpO2: 98% 93% 94% 97%  Weight:      Height:         Start Time: 0932 hrs. End Time: 0944 hrs.  Imaging Guidance (Spinal):          Type of Imaging Technique: Fluoroscopy Guidance (Spinal) Indication(s): Assistance in needle guidance and placement for procedures requiring needle placement in or near specific anatomical locations not easily accessible without such assistance. Exposure Time: Please see nurses  notes. Contrast: None used. Fluoroscopic Guidance: I was personally present during the use of fluoroscopy. "Tunnel Vision Technique" used to obtain the best possible view of the target area. Parallax error corrected before commencing the procedure. "Direction-depth-direction" technique used to introduce the needle under continuous pulsed fluoroscopy. Once target was reached, antero-posterior, oblique, and lateral fluoroscopic projection used confirm needle placement in all planes. Images permanently stored in EMR. Interpretation: No contrast injected. I personally interpreted the imaging intraoperatively. Adequate needle placement confirmed in multiple planes. Permanent images saved into the patient's record.  Antibiotic Prophylaxis:   Anti-infectives (From admission, onward)   None     Indication(s): None identified  Post-operative Assessment:  Post-procedure Vital Signs:  Pulse/HCG Rate: 7766 Temp: 97.6 F (36.4 C) Resp: 18 BP: 119/71 SpO2: 97 %  EBL: None  Complications: No immediate post-treatment complications observed by team, or reported by patient.  Note: The patient tolerated the entire procedure well. A repeat set of vitals were taken after the procedure and the patient was kept under observation following institutional policy, for this type of procedure. Post-procedural neurological assessment was performed, showing return to baseline, prior to discharge. The patient was provided with post-procedure discharge instructions, including a section on how to identify potential problems. Should any problems arise concerning this procedure, the patient was given instructions to immediately contact us, at any time, without hesitation. In any case, we plan to contact the patient by telephone for a follow-up status report regarding this interventional procedure.  Comments:  No additional relevant information.  Plan of Care    Imaging Orders     DG C-Arm 1-60 Min-No  Report  Procedure Orders     LUMBAR FACET(MEDIAL BRANCH NERVE BLOCK) MBNB  Medications ordered for procedure: Meds ordered this encounter  Medications  . lidocaine (XYLOCAINE) 2 % (with pres) injection 400 mg  . midazolam (VERSED) 5  MG/5ML injection 1-2 mg    Make sure Flumazenil is available in the pyxis when using this medication. If oversedation occurs, administer 0.2 mg IV over 15 sec. If after 45 sec no response, administer 0.2 mg again over 1 min; may repeat at 1 min intervals; not to exceed 4 doses (1 mg)  . fentaNYL (SUBLIMAZE) injection 25-50 mcg    Make sure Narcan is available in the pyxis when using this medication. In the event of respiratory depression (RR< 8/min): Titrate NARCAN (naloxone) in increments of 0.1 to 0.2 mg IV at 2-3 minute intervals, until desired degree of reversal.  . lactated ringers infusion 1,000 mL  . ropivacaine (PF) 2 mg/mL (0.2%) (NAROPIN) injection 18 mL  . triamcinolone acetonide (KENALOG-40) injection 80 mg   Medications administered: We administered lidocaine, midazolam, fentaNYL, lactated ringers, ropivacaine (PF) 2 mg/mL (0.2%), and triamcinolone acetonide.  See the medical record for exact dosing, route, and time of administration.  New Prescriptions   No medications on file   Disposition: Discharge home  Discharge Date & Time: 10/09/2017; 1019 hrs.   Physician-requested Follow-up: Return for post-procedure eval (2 wks), w/ Dr. Dossie Arbour.  Future Appointments  Date Time Provider [place]  10/24/2017  2:00 PM Milinda Pointer, MD [facility] None  11/26/2017  8:45 AM [facility] None  11/28/2017  8:15 AM Danford, Valetta Fuller D, NP [facility] None  08/06/2018 10:00 AM [facility] None  08/06/2018 10:30 AM Nicholas Lose, MD [facility] None   Primary Care Physician: Esaw Grandchild, NP Location: [facility] Note by: Gaspar Cola, MD Date: 10/09/2017; Time: 10:36  AM  Disclaimer:  Medicine is not an exact science. The only guarantee in medicine is that nothing is guaranteed. It is important to note that the decision to proceed with this intervention was based on the information collected from the patient. The Data and conclusions were drawn from the patient's questionnaire, the interview, and the physical examination. Because the information was provided in large part by the patient, it cannot be guaranteed that it has not been purposely or unconsciously manipulated. Every effort has been made to obtain as much relevant data as possible for this evaluation. It is important to note that the conclusions that lead to this procedure are derived in large part from the available data. Always take into account that the treatment will also be dependent on availability of resources and existing treatment guidelines, considered by other Pain Management Practitioners as being common knowledge and practice, at the time of the intervention. For Medico-Legal purposes, it is also important to point out that variation in procedural techniques and pharmacological choices are the acceptable norm. The indications, contraindications, technique, and results of the above procedure should only be interpreted and judged by a Board-Certified Interventional Pain Specialist with extensive familiarity and expertise in the same exact procedure and technique.

## 2017-10-09 NOTE — Patient Instructions (Signed)

## 2017-10-10 ENCOUNTER — Telehealth: Payer: Self-pay | Admitting: *Deleted

## 2017-10-10 NOTE — Telephone Encounter (Signed)
No problems post procedure. 

## 2017-10-22 NOTE — Progress Notes (Signed)
Patient's Name: DELVINA MIZZELL  MRN: [id number]  Referring Provider: Esaw Grandchild, NP  DOB: [birth year]  PCP: Mina Marble D, NP  DOS: 10/24/2017  Note by: Gaspar Cola, MD  Service setting: Ambulatory outpatient  Specialty: Interventional Pain Management  Location: [facility] (AMB) Pain Management Facility    Patient type: Established   Primary Reason(s) for Visit: Encounter for post-procedure evaluation of chronic illness with mild to moderate exacerbation CC: Back Pain (lower) and Neck Pain (shoulders bilateral)  HPI  Ms. Storie is a 62 y.o. year old, female patient, who comes today for a post-procedure evaluation. She has Large B-cell lymphoma ([place]); Chronic hepatitis C without hepatic coma ([place]); Cirrhosis ([place]); Pneumonia, lobar ([place]); Intermittent asthma; Obesity; Essential hypertension; Dyspnea; Obesity (BMI 30-39.9); GERD (gastroesophageal reflux disease); Anxiety; Depression; Healthcare maintenance; Chronic idiopathic constipation; Hyperlipidemia; Anemia; Atypical chest pain; Constipation; History of thyroid disease; Osteoarthrosis involving multiple sites; Stress incontinence; Urge incontinence; Hepatitis C; Benign hypertension; Gastritis; Asthma; Chronic low back pain (Primary Area of Pain) (Bilateral) w/ sciatica (Left); Chronic lower extremity pain (Secondary Area of Pain) (Left); Chronic upper back pain [facility] Area of Pain) (Bilateral) (L>R); Chronic neck pain (Fourth Area of Pain) (Bilateral) (L>R); Occipital headache; Chronic knee pain (Bilateral) (R>L); Chronic pain syndrome; Pharmacologic therapy; Long term current use of opiate analgesic; Disorder of skeletal system; Problems influencing health status; Chronic sacroiliac joint pain; Elevated C-reactive protein (CRP); Elevated sed rate; DDD (degenerative disc disease), lumbar; Lumbar facet arthropathy; Lumbar facet syndrome (Bilateral) (L>R); Spondylosis without myelopathy or radiculopathy, lumbar region; DDD  (degenerative disc disease), thoracic; Thoracolumbar dextroscoliosis; Vitamin D deficiency; Cervicogenic headache; Other dysphagia; Tendinitis of left rotator cuff; Tendinitis of right rotator cuff; Fibromyalgia; Chronic shoulder pain (Bilateral) (R>L); Chronic hip pain (Bilateral) (L>R); Cervicalgia; Elevated uric acid in blood; Chronic idiopathic gout; DDD (degenerative disc disease), cervical; 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 Her primarily concern today is the Back Pain (lower) and Neck Pain (shoulders bilateral)  Pain Assessment: Location: Lower, Right, Left Back Radiating: left buttocks down back of leg to feet Onset: More than a month ago Duration: Chronic pain Quality: Burning, Dull Severity: 2 /10 (subjective, self-reported pain score)  Note: Reported level is compatible with observation.                         When using our objective Pain Scale, levels between 6 and 10/10 are said to belong in an emergency room, as it progressively worsens from a 6/10, described as severely limiting, requiring emergency care not usually available at an outpatient pain management facility. At a 6/10 level, communication becomes difficult and requires great effort. Assistance to reach the emergency department may be required. Facial flushing and profuse sweating along with potentially dangerous increases in heart rate and blood pressure will be evident. Effect on ADL: walking better, stumbling once in a while  Timing: Constant Modifying factors: procedure, medications BP: 137/84  HR: (!) 101  [name] comes in today for post-procedure evaluation.  Further details on both, my assessment(s), as well as the proposed treatment plan, please see below.  Post-Procedure Assessment  10/09/2017 Procedure:  Diagnostic bilateral lumbar facet nerve block #1 under fluoro and IV sedation Pre-procedure pain score:  2/10 Post-procedure pain score: 0/10 (100% relief) Influential Factors: BMI: 33.61 kg/m Intra-procedural challenges: None observed.  Assessment challenges: None detected.              Reported side-effects: None.        Post-procedural adverse reactions or complications: None reported         Sedation: Sedation provided. When no sedatives are used, the analgesic levels obtained are directly associated to the effectiveness of the local anesthetics. However, when sedation is provided, the level of analgesia obtained during the initial 1 hour following the intervention, is believed to be the result of a combination of factors. These factors may include, but are not limited to: 1. The effectiveness of the local anesthetics used. 2. The effects of the analgesic(s) and/or anxiolytic(s) used. 3. The degree of discomfort experienced by the patient at the time of the procedure. 4. The patients ability and reliability in recalling and recording the events. 5. The presence and influence of possible secondary gains and/or psychosocial factors. Reported result: Relief experienced during the 1st hour after the procedure: 100 % (Ultra-Short Term Relief)            Interpretative annotation: Clinically appropriate result. Analgesia during this period is likely to be Local Anesthetic and/or IV Sedative (Analgesic/Anxiolytic) related.          Effects of local anesthetic: The analgesic effects attained during this period are directly associated to the localized infiltration of local anesthetics and therefore cary significant diagnostic value as to the etiological location, or anatomical origin, of the pain. Expected duration of relief is directly dependent on the pharmacodynamics of the local anesthetic used. Long-acting (4-6 hours) anesthetics used.  Reported result: Relief during the next 4 to 6 hour  after the procedure: 100 % (Short-Term Relief)            Interpretative annotation: Clinically appropriate result. Analgesia during this period is likely to be Local Anesthetic-related.          Long-term benefit: Defined as the period of time past the expected duration of local anesthetics (1 hour for short-acting and 4-6 hours for long-acting). With the possible exception of prolonged sympathetic blockade from the local anesthetics, benefits during this period are typically attributed to, or associated with, other factors such as analgesic sensory neuropraxia, antiinflammatory effects, or beneficial biochemical changes provided by agents other than the local anesthetics.  Reported result: Extended relief following procedure: 60 % (Long-Term Relief)            Interpretative annotation: Clinically possible results. Good relief. No permanent benefit expected. Inflammation plays a part in the etiology to the pain.          Current benefits: Defined as reported results that persistent at this point in time.   Analgesia: 60 %            Function: Somewhat improved ROM: Somewhat improved Interpretative annotation: Recurrence of symptoms. No permanent benefit expected. Effective diagnostic intervention.          Interpretation: Results would suggest a successful diagnostic intervention.                  Plan:  Please see "Plan of Care" for details.                Controlled Substance Pharmacotherapy Assessment REMS (Risk Evaluation and Mitigation Strategy)  Analgesic: Tramadol 50 mg 1 tablet p.o. twice daily (the patient indicates that she has not had to need to use any since her block.) MME/day: 10 mg/day.  Ignatius Specking, RN  10/24/2017 12:50 PM  Sign at close encounter Nursing Pain Medication Assessment:  Safety precautions to be maintained throughout the outpatient stay will include: orient to surroundings, keep bed in low position, maintain call bell within reach at all times, provide  assistance with transfer out of bed and ambulation.  Medication Inspection Compliance: Pill count conducted under aseptic conditions, in front of the patient. Neither the pills nor the bottle was removed from the patient's sight at any time. Once count was completed pills were immediately returned to the patient in their original bottle.  Medication: Tramadol (Ultram) Pill/Patch Count: 35 of 56 pills remain Pill/Patch Appearance: Markings consistent with prescribed medication Bottle Appearance: Standard pharmacy container. Clearly labeled. Filled Date: 32 / 18 / 2019 Last Medication intake:  Day before the procedure 10/08/2017   Patient also has a full bottle that has not been opened and still in bag 10/09/17   Pharmacokinetics: Liberation and absorption (onset of action): WNL Distribution (time to peak effect): WNL Metabolism and excretion (duration of action): WNL         Pharmacodynamics: Desired effects: Analgesia: Ms. Lawhead reports >50% benefit. Functional ability: Patient reports that medication allows her to accomplish basic ADLs Clinically meaningful improvement in function (CMIF): Sustained CMIF goals met Perceived effectiveness: Described as relatively effective, allowing for increase in activities of daily living (ADL) Undesirable effects: Side-effects or Adverse reactions: None reported Monitoring: [place] PMP: Online review of the past 67-monthperiod conducted. Compliant with practice rules and regulations Last UDS on record: Summary  Date Value Ref Range Status  08/29/2017 FINAL  Final    Comment:    ==================================================================== TOXASSURE COMP DRUG ANALYSIS,UR ==================================================================== Test                             Result       Flag       Units Drug Present and Declared for Prescription Verification   Cyclobenzaprine                PRESENT      EXPECTED   Desmethylcyclobenzaprine        PRESENT      EXPECTED    Desmethylcyclobenzaprine is an expected metabolite of    cyclobenzaprine.   Sertraline                     PRESENT      EXPECTED   Desmethylsertraline            PRESENT      EXPECTED    Desmethylsertraline is an expected metabolite of sertraline.   Trazodone                      PRESENT      EXPECTED   1,3 chlorophenyl piperazine    PRESENT      EXPECTED    1,3-chlorophenyl piperazine is an expected metabolite of    trazodone.   Hydroxyzine                    PRESENT      EXPECTED   Atenolol                       PRESENT      EXPECTED Drug Present not Declared for Prescription Verification   Tramadol  3363         UNEXPECTED ng/mg creat   O-Desmethyltramadol            1058         UNEXPECTED ng/mg creat   N-Desmethyltramadol            727          UNEXPECTED ng/mg creat    Source of tramadol is a prescription medication.    O-desmethyltramadol and N-desmethyltramadol are expected    metabolites of tramadol.   Naproxen                       PRESENT      UNEXPECTED ==================================================================== Test                      Result    Flag   Units      Ref Range   Creatinine              132              mg/dL      >=20 ==================================================================== Declared Medications:  The flagging and interpretation on this report are based on the  following declared medications.  Unexpected results may arise from  inaccuracies in the declared medications.  **Note: The testing scope of this panel includes these medications:  Atenolol (Tenormin)  Cyclobenzaprine (Flexeril)  Hydroxyzine (Vistaril)  Sertraline (Zoloft)  Trazodone (Desyrel)  **Note: The testing scope of this panel does not include following  reported medications:  Albuterol (Duoneb)  Albuterol (Ventolin HFA)  Bimatoprost (Lumigan)  Buspirone (BuSpar)  Ipratropium (Duoneb)  Linaclotide (Linzess)   Lisinopril  Meloxicam (Mobic)  Montelukast (Singulair)  Omeprazole (Prilosec)  Paliperidone (Invega)  Polyethylene Glycol  Prazosin (Minipress)  Solifenacin (Vesicare) ==================================================================== For clinical consultation, please call [phone]. ====================================================================    UDS interpretation: Compliant          Medication Assessment Form: Reviewed. Patient indicates being compliant with therapy Treatment compliance: Compliant Risk Assessment Profile: Aberrant behavior: See prior evaluations. None observed or detected today Comorbid factors increasing risk of overdose: See prior notes. No additional risks detected today Risk of substance use disorder (SUD): Low  ORT Scoring interpretation table:  Score <3 = Low Risk for SUD  Score between 4-7 = Moderate Risk for SUD  Score >8 = High Risk for Opioid Abuse   Risk Mitigation Strategies:  Patient Counseling: Covered Patient-Prescriber Agreement (PPA): Present and active  Notification to other healthcare providers: Done  Pharmacologic Plan: No change in therapy, at this time.              Laboratory Chemistry  Inflammation Markers (CRP: Acute Phase) (ESR: Chronic Phase) Lab Results  Component Value Date   CRP 12 (H) 08/29/2017   ESRSEDRATE 46 (H) 08/29/2017                         Renal Markers Lab Results  Component Value Date   BUN 8 08/29/2017   CREATININE 0.84 08/29/2017   BCR 10 (L) 08/29/2017   GFRAA 85 08/29/2017   GFRNONAA 74 08/29/2017                             Hepatic Markers Lab Results  Component Value Date   AST 26 08/29/2017   ALT 20 08/06/2017   ALBUMIN 4.1 08/29/2017   HCVAB REACTIVE (  A) 01/12/2014                        Neuropathy Markers Lab Results  Component Value Date   VITAMINB12 728 08/29/2017   HGBA1C 5.7 (H) 05/09/2017   HIV NONREACTIVE 01/12/2014                        Hematology  Parameters Lab Results  Component Value Date   INR 1.12 11/11/2014   LABPROT 14.6 11/11/2014   APTT 31 11/11/2014   PLT 192 08/06/2017   HGB 11.2 (L) 08/06/2017   HCT 34.5 (L) 08/06/2017                        CV Markers Lab Results  Component Value Date   CKTOTAL 81 11/01/2010   CKMB 1.0 11/01/2010                         Note: Lab results reviewed.  Recent Imaging Results   Results for orders placed in visit on 10/09/17  DG C-Arm 1-60 Min-No Report   Narrative Fluoroscopy was utilized by the requesting physician.  No radiographic  interpretation.    Interpretation Report: Fluoroscopy was used during the procedure to assist with needle guidance. The images were interpreted intraoperatively by the requesting physician.  Meds   Current Outpatient Medications:  .  albuterol (PROVENTIL HFA;VENTOLIN HFA) 108 (90 Base) MCG/ACT inhaler, Inhale 2 puffs into the lungs 4 (four) times daily., Disp: 1 Inhaler, Rfl: 5 .  atenolol (TENORMIN) 25 MG tablet, Take 25 mg by mouth daily., Disp: , Rfl:  .  busPIRone (BUSPAR) 5 MG tablet, Take 1 tablet (5 mg total) by mouth 2 (two) times daily. (Patient taking differently: Take 5 mg by mouth 3 (three) times daily. ), Disp: , Rfl:  .  Calcium Carbonate-Vit D-Min (GNP CALCIUM 1200) 1200-1000 MG-UNIT CHEW, Chew 1,200 mg by mouth daily with breakfast. Take in combination with vitamin D and magnesium., Disp: 30 tablet, Rfl: 5 .  Cholecalciferol (VITAMIN D3) 5000 units CAPS, Take 1 capsule (5,000 Units total) by mouth daily with breakfast. Take along with calcium and magnesium., Disp: 30 capsule, Rfl: 5 .  cyclobenzaprine (FLEXERIL) 5 MG tablet, Take 1 tablet by mouth at bedtime., Disp: , Rfl: 3 .  ergocalciferol (VITAMIN D2) 50000 units capsule, Take 1 capsule (50,000 Units total) by mouth 2 (two) times a week. X 6 weeks., Disp: 12 capsule, Rfl: 0 .  hydrOXYzine (VISTARIL) 50 MG capsule, Take 50 mg by mouth at bedtime., Disp: , Rfl:  .   ipratropium-albuterol (DUONEB) 0.5-2.5 (3) MG/3ML SOLN, Inhale 3 mLs into the lungs See admin instructions., Disp: 360 mL, Rfl: 5 .  linaclotide (LINZESS) 72 MCG capsule, Take 1 capsule (72 mcg total) by mouth daily before breakfast., Disp: 30 capsule, Rfl:  .  lisinopril (PRINIVIL,ZESTRIL) 5 MG tablet, Take 5 mg by mouth daily., Disp: , Rfl:  .  Magnesium 500 MG CAPS, Take 1 capsule (500 mg total) by mouth 2 (two) times daily at 8 am and 10 pm., Disp: 60 capsule, Rfl: 5 .  meloxicam (MOBIC) 15 MG tablet, Take 15 mg by mouth daily., Disp: , Rfl: 3 .  montelukast (SINGULAIR) 10 MG tablet, Take 1 tablet (10 mg total) by mouth daily., Disp: 90 tablet, Rfl: 3 .  omeprazole (PRILOSEC) 40 MG capsule, 1 CAPSULE BY MOUTH DAILY, Disp: ,  Rfl: 3 .  paliperidone (INVEGA) 6 MG 24 hr tablet, Take 6 mg by mouth every morning., Disp: , Rfl:  .  polyethylene glycol (MIRALAX / GLYCOLAX) packet, Take 17 g by mouth 2 (two) times daily., Disp: , Rfl:  .  prazosin (MINIPRESS) 1 MG capsule, Take 1 mg by mouth at bedtime., Disp: , Rfl:  .  sertraline (ZOLOFT) 100 MG tablet, Take 200 mg by mouth every morning., Disp: , Rfl:  .  traMADol (ULTRAM) 50 MG tablet, Take 2 tablets (100 mg total) by mouth every 6 (six) hours as needed for severe pain., Disp: 240 tablet, Rfl: 0 .  traZODone (DESYREL) 50 MG tablet, 1 TABLET BY MOUTH AT BEDTIME, Disp: , Rfl: 5 .  VESICARE 10 MG tablet, Take 10 mg by mouth daily., Disp: , Rfl: 3  ROS  Constitutional: Denies any fever or chills Gastrointestinal: No reported hemesis, hematochezia, vomiting, or acute GI distress Musculoskeletal: Denies any acute onset joint swelling, redness, loss of ROM, or weakness Neurological: No reported episodes of acute onset apraxia, aphasia, dysarthria, agnosia, amnesia, paralysis, loss of coordination, or loss of consciousness  Allergies  [name] is allergic to latex; penicillins; codeine; cortisone; and sulfa antibiotics.  PFSH  Drug: Ms.  Spring  reports that she does not use drugs. Alcohol:  reports that she does not drink alcohol. Tobacco:  reports that she quit smoking about 27 years ago. Her smoking use included cigarettes. She has a 30.00 pack-year smoking history. She has never used smokeless tobacco. Medical:  has a past medical history of Anemia, Anxiety, Arthritis, Asthma, Depression, GERD (gastroesophageal reflux disease), Hepatitis-C, History of chemotherapy, Hypertension, Insomnia, Lymphoma ([place]) (12/2013), Personal history of non-Hodgkin lymphomas, and Vitamin D deficiency. Surgical: Ms. Tupou  has a past surgical history that includes Appendectomy; Trigger finger release; Esophagogastroduodenoscopy (01/17/2011); Colonoscopy (01/17/2011); Abdominal hysterectomy; and Breast biopsy (Left, 2015). Family: family history includes Allergies in her mother; Asthma in her mother and sister; Breast cancer in her cousin; Colon cancer in her paternal aunt; Diabetes in her maternal grandfather and maternal grandmother; Heart disease in her brother, father, paternal grandfather, and paternal grandmother; Liver cancer in her paternal uncle; Ovarian cancer in her other; Pancreatic cancer in her other.  Constitutional Exam  General appearance: Well nourished, well developed, and well hydrated. In no apparent acute distress Vitals:   10/24/17 1233  BP: 137/84  Pulse: (!) 101  Resp: 16  Temp: 98.4 F (36.9 C)  SpO2: 100%  Weight: 202 lb (91.6 kg)  Height: _0  (1.651 m)   BMI Assessment: Estimated body mass index is 33.61 kg/m as calculated from the following:   Height as of this encounter: _1  (1.651 m).   Weight as of this encounter: 202 lb (91.6 kg).  BMI interpretation table: BMI level Category Range association with higher incidence of chronic pain  <18 kg/m2 Underweight   18.5-24.9 kg/m2 Ideal body weight   25-29.9 kg/m2 Overweight Increased incidence by 20%  30-34.9 kg/m2 Obese (Class I) Increased  incidence by 68%  35-39.9 kg/m2 Severe obesity (Class II) Increased incidence by 136%  >40 kg/m2 Extreme obesity (Class III) Increased incidence by 254%   Patient's current BMI Ideal Body weight  Body mass index is 33.61 kg/m. Ideal body weight: 57 kg (125 lb 10.6 oz) Adjusted ideal body weight: 70.9 kg (156 lb 3.2 oz)   BMI Readings from Last 4 Encounters:  10/24/17 33.61 kg/m  10/09/17 34.45 kg/m  09/26/17 34.45 kg/m  08/29/17 34.45 kg/m  Wt Readings from Last 4 Encounters:  10/24/17 202 lb (91.6 kg)  10/09/17 207 lb (93.9 kg)  09/26/17 207 lb (93.9 kg)  08/29/17 207 lb (93.9 kg)  Psych/Mental status: Alert, oriented x 3 (person, place, & time)       Eyes: PERLA Respiratory: No evidence of acute respiratory distress  Cervical Spine Area Exam  Skin & Axial Inspection: No masses, redness, edema, swelling, or associated skin lesions Alignment: Symmetrical Functional ROM: Decreased ROM      Stability: No instability detected Muscle Tone/Strength: Guarding observed Sensory (Neurological): Movement-associated pain Palpation: Complains of area being tender to palpation              Upper Extremity (UE) Exam    Side: Right upper extremity  Side: Left upper extremity  Skin & Extremity Inspection: Skin color, temperature, and hair growth are WNL. No peripheral edema or cyanosis. No masses, redness, swelling, asymmetry, or associated skin lesions. No contractures.  Skin & Extremity Inspection: Skin color, temperature, and hair growth are WNL. No peripheral edema or cyanosis. No masses, redness, swelling, asymmetry, or associated skin lesions. No contractures.  Functional ROM: Unrestricted ROM          Functional ROM: Unrestricted ROM          Muscle Tone/Strength: Functionally intact. No obvious neuro-muscular anomalies detected.  Muscle Tone/Strength: Functionally intact. No obvious neuro-muscular anomalies detected.  Sensory (Neurological): Unimpaired          Sensory  (Neurological): Unimpaired          Palpation: No palpable anomalies              Palpation: No palpable anomalies              Provocative Test(s):  Phalen's test: deferred Tinel's test: deferred Apley's scratch test (touch opposite shoulder):  Action 1 (Across chest): deferred Action 2 (Overhead): deferred Action 3 (LB reach): deferred   Provocative Test(s):  Phalen's test: deferred Tinel's test: deferred Apley's scratch test (touch opposite shoulder):  Action 1 (Across chest): deferred Action 2 (Overhead): deferred Action 3 (LB reach): deferred    Thoracic Spine Area Exam  Skin & Axial Inspection: No masses, redness, or swelling Alignment: Symmetrical Functional ROM: Unrestricted ROM Stability: No instability detected Muscle Tone/Strength: Functionally intact. No obvious neuro-muscular anomalies detected. Sensory (Neurological): Unimpaired Muscle strength & Tone: No palpable anomalies  Lumbar Spine Area Exam  Skin & Axial Inspection: No masses, redness, or swelling Alignment: Symmetrical Functional ROM: Improved after treatment       Stability: No instability detected Muscle Tone/Strength: Functionally intact. No obvious neuro-muscular anomalies detected. Sensory (Neurological): Unimpaired Palpation: No palpable anomalies       Provocative Tests: Hyperextension/rotation test: Improved after treatment       Lumbar quadrant test (Kemp's test): Improved after treatment       Lateral bending test: deferred today       Patrick's Maneuver: deferred today                   FABER test: deferred today                   S-I anterior distraction/compression test: deferred today         S-I lateral compression test: deferred today         S-I Thigh-thrust test: deferred today         S-I Gaenslen's test: deferred today          Gait &  Posture Assessment  Ambulation: Unassisted Gait: Relatively normal for age and body habitus Posture: WNL   Lower Extremity Exam    Side:  Right lower extremity  Side: Left lower extremity  Stability: No instability observed          Stability: No instability observed          Skin & Extremity Inspection: Skin color, temperature, and hair growth are WNL. No peripheral edema or cyanosis. No masses, redness, swelling, asymmetry, or associated skin lesions. No contractures.  Skin & Extremity Inspection: Skin color, temperature, and hair growth are WNL. No peripheral edema or cyanosis. No masses, redness, swelling, asymmetry, or associated skin lesions. No contractures.  Functional ROM: Unrestricted ROM                  Functional ROM: Unrestricted ROM                  Muscle Tone/Strength: Functionally intact. No obvious neuro-muscular anomalies detected.  Muscle Tone/Strength: Functionally intact. No obvious neuro-muscular anomalies detected.  Sensory (Neurological): Unimpaired  Sensory (Neurological): Unimpaired  Palpation: No palpable anomalies  Palpation: No palpable anomalies   Assessment  Primary Diagnosis & Pertinent Problem List: The primary encounter diagnosis was Chronic low back pain (Primary Area of Pain) (Bilateral) w/ sciatica (Left). Diagnoses of Chronic lower extremity pain (Secondary Area of Pain) (Left), Chronic upper back pain (Tertiary Area of Pain) (Bilateral) (L>R), Chronic neck pain (Fourth Area of Pain) (Bilateral) (L>R), Lumbar facet syndrome (Bilateral) (L>R), Spondylosis without myelopathy or radiculopathy, lumbar region, Cervicalgia, Cervicogenic headache, Cervical facet syndrome (Bilateral) (R>L), and Spondylosis without myelopathy or radiculopathy, cervical region were also pertinent to this visit.  Status Diagnosis  Improved Resolved Controlled 1. 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 Chronic lower extremity pain (Secondary Area of Pain) (Left)   3. Chronic upper back pain [facility] Area of Pain) (Bilateral) (L>R)   4. Chronic neck pain (Fourth Area of Pain)  (Bilateral) (L>R)   5. Lumbar facet syndrome (Bilateral) (L>R)   6. Spondylosis without myelopathy or radiculopathy, lumbar region   7. Cervicalgia   8. Cervicogenic headache   9. Cervical facet syndrome (Bilateral) (R>L)   10. Spondylosis without myelopathy or radiculopathy, cervical region     Problems updated and reviewed during this visit: Problem  Cervical facet syndrome (Bilateral) (R>L)  Spondylosis Without Myelopathy Or Radiculopathy, Cervical Region   Plan of Care  Pharmacotherapy (Medications Ordered): No orders of the defined types were placed in this encounter.  Medications administered today: [name] had no medications administered during this visit.   Procedure Orders     LUMBAR FACET(MEDIAL BRANCH NERVE BLOCK) MBNB     CERVICAL FACET (MEDIAL BRANCH NERVE BLOCK)  Lab Orders  No laboratory test(s) ordered today   Imaging Orders  No imaging studies ordered today   Referral Orders  No referral(s) requested today   Interventional management options: Planned, scheduled, and/or pending:   Diagnostic bilateral cervical facet block #1 under fluoro and IV sedation   Considering:   Diagnostic left-sided lumbar epidural steroid injection Diagnostic bilateral lumbar facet block Possible bilateral lumbar facet RFA Diagnostic bilateral thoracic facet block Possiblebilateral thoracic facet RFA Diagnostic bilateral cervical facet block Possible bilateral cervical facet RFA Diagnostic bilateral greater occipital nerve blocks Diagnostic trigger point injections   Palliative PRN treatment(s):   Palliative/Diagnostic bilateral lumbar facet block #2 under fluoro and IV sedation   Provider-requested follow-up: Return for Procedure (  w/ sedation): (B) C-FCT BLK #1.  Future Appointments  Date Time Provider [place]  10/24/2017  2:00 PM Milinda Pointer, MD [facility] None  11/06/2017  8:15 AM Milinda Pointer, MD [facility] None  11/26/2017   8:45 AM [facility] None  11/28/2017  8:15 AM Danford, Valetta Fuller D, NP [facility] None  08/06/2018 10:00 AM [facility] None  08/06/2018 10:30 AM Nicholas Lose, MD [facility] None   Primary Care Physician: Esaw Grandchild, NP Location: [facility] Note by: Gaspar Cola, MD Date: 10/24/2017; Time: 1:48 PM

## 2017-10-24 ENCOUNTER — Other Ambulatory Visit: Payer: Self-pay

## 2017-10-24 ENCOUNTER — Encounter: Payer: Self-pay | Admitting: Pain Medicine

## 2017-10-24 ENCOUNTER — Ambulatory Visit: Payer: Medicare Other | Attending: Pain Medicine | Admitting: Pain Medicine

## 2017-10-24 VITALS — BP 137/84 | HR 101 | Temp 98.4°F | Resp 16 | Ht 65.0 in | Wt 202.0 lb

## 2017-10-24 DIAGNOSIS — Z8572 Personal history of non-Hodgkin lymphomas: Secondary | ICD-10-CM | POA: Diagnosis not present

## 2017-10-24 DIAGNOSIS — B182 Chronic viral hepatitis C: Secondary | ICD-10-CM | POA: Diagnosis not present

## 2017-10-24 DIAGNOSIS — M4802 Spinal stenosis, cervical region: Secondary | ICD-10-CM | POA: Insufficient documentation

## 2017-10-24 DIAGNOSIS — G894 Chronic pain syndrome: Secondary | ICD-10-CM | POA: Diagnosis not present

## 2017-10-24 DIAGNOSIS — K219 Gastro-esophageal reflux disease without esophagitis: Secondary | ICD-10-CM | POA: Diagnosis not present

## 2017-10-24 DIAGNOSIS — Z885 Allergy status to narcotic agent status: Secondary | ICD-10-CM | POA: Diagnosis not present

## 2017-10-24 DIAGNOSIS — E559 Vitamin D deficiency, unspecified: Secondary | ICD-10-CM | POA: Insufficient documentation

## 2017-10-24 DIAGNOSIS — J45909 Unspecified asthma, uncomplicated: Secondary | ICD-10-CM | POA: Insufficient documentation

## 2017-10-24 DIAGNOSIS — G4486 Cervicogenic headache: Secondary | ICD-10-CM

## 2017-10-24 DIAGNOSIS — M79605 Pain in left leg: Secondary | ICD-10-CM | POA: Diagnosis not present

## 2017-10-24 DIAGNOSIS — K7460 Unspecified cirrhosis of liver: Secondary | ICD-10-CM | POA: Insufficient documentation

## 2017-10-24 DIAGNOSIS — Z79899 Other long term (current) drug therapy: Secondary | ICD-10-CM | POA: Insufficient documentation

## 2017-10-24 DIAGNOSIS — R51 Headache: Secondary | ICD-10-CM | POA: Diagnosis not present

## 2017-10-24 DIAGNOSIS — Z9221 Personal history of antineoplastic chemotherapy: Secondary | ICD-10-CM | POA: Diagnosis not present

## 2017-10-24 DIAGNOSIS — Z91040 Latex allergy status: Secondary | ICD-10-CM | POA: Diagnosis not present

## 2017-10-24 DIAGNOSIS — Z87891 Personal history of nicotine dependence: Secondary | ICD-10-CM | POA: Diagnosis not present

## 2017-10-24 DIAGNOSIS — Z880 Allergy status to penicillin: Secondary | ICD-10-CM | POA: Insufficient documentation

## 2017-10-24 DIAGNOSIS — M5442 Lumbago with sciatica, left side: Secondary | ICD-10-CM | POA: Insufficient documentation

## 2017-10-24 DIAGNOSIS — M549 Dorsalgia, unspecified: Secondary | ICD-10-CM

## 2017-10-24 DIAGNOSIS — M47812 Spondylosis without myelopathy or radiculopathy, cervical region: Secondary | ICD-10-CM | POA: Diagnosis not present

## 2017-10-24 DIAGNOSIS — E669 Obesity, unspecified: Secondary | ICD-10-CM | POA: Insufficient documentation

## 2017-10-24 DIAGNOSIS — M542 Cervicalgia: Secondary | ICD-10-CM

## 2017-10-24 DIAGNOSIS — I10 Essential (primary) hypertension: Secondary | ICD-10-CM | POA: Insufficient documentation

## 2017-10-24 DIAGNOSIS — Z79891 Long term (current) use of opiate analgesic: Secondary | ICD-10-CM | POA: Diagnosis not present

## 2017-10-24 DIAGNOSIS — M533 Sacrococcygeal disorders, not elsewhere classified: Secondary | ICD-10-CM | POA: Insufficient documentation

## 2017-10-24 DIAGNOSIS — Z882 Allergy status to sulfonamides status: Secondary | ICD-10-CM | POA: Insufficient documentation

## 2017-10-24 DIAGNOSIS — G8929 Other chronic pain: Secondary | ICD-10-CM

## 2017-10-24 DIAGNOSIS — F329 Major depressive disorder, single episode, unspecified: Secondary | ICD-10-CM | POA: Insufficient documentation

## 2017-10-24 DIAGNOSIS — Z6833 Body mass index (BMI) 33.0-33.9, adult: Secondary | ICD-10-CM | POA: Insufficient documentation

## 2017-10-24 DIAGNOSIS — M47816 Spondylosis without myelopathy or radiculopathy, lumbar region: Secondary | ICD-10-CM

## 2017-10-24 NOTE — Progress Notes (Signed)
Nursing Pain Medication Assessment:  Safety precautions to be maintained throughout the outpatient stay will include: orient to surroundings, keep bed in low position, maintain call bell within reach at all times, provide assistance with transfer out of bed and ambulation.  Medication Inspection Compliance: Pill count conducted under aseptic conditions, in front of the patient. Neither the pills nor the bottle was removed from the patient's sight at any time. Once count was completed pills were immediately returned to the patient in their original bottle.  Medication: Tramadol (Ultram) Pill/Patch Count: 35 of 56 pills remain Pill/Patch Appearance: Markings consistent with prescribed medication Bottle Appearance: Standard pharmacy container. Clearly labeled. Filled Date: 8 / 18 / 2019 Last Medication intake:  Day before the procedure 10/08/2017   Patient also has a full bottle that has not been opened and still in bag 10/09/17

## 2017-10-24 NOTE — Patient Instructions (Signed)
____________________________________________________________________________________________  Preparing for Procedure with Sedation  Instructions: . Oral Intake: Do not eat or drink anything for at least 8 hours prior to your procedure. . Transportation: Public transportation is not allowed. Bring an adult driver. The driver must be physically present in our waiting room before any procedure can be started. . Physical Assistance: Bring an adult physically capable of assisting you, in the event you need help. This adult should keep you company at home for at least 6 hours after the procedure. . Blood Pressure Medicine: Take your blood pressure medicine with a sip of water the morning of the procedure. . Blood thinners: Notify our staff if you are taking any blood thinners. Depending on which one you take, there will be specific instructions on how and when to stop it. . Diabetics on insulin: Notify the staff so that you can be scheduled 1st case in the morning. If your diabetes requires high dose insulin, take only  of your normal insulin dose the morning of the procedure and notify the staff that you have done so. . Preventing infections: Shower with an antibacterial soap the morning of your procedure. . Build-up your immune system: Take 1000 mg of Vitamin C with every meal (3 times a day) the day prior to your procedure. . Antibiotics: Inform the staff if you have a condition or reason that requires you to take antibiotics before dental procedures. . Pregnancy: If you are pregnant, call and cancel the procedure. . Sickness: If you have a cold, fever, or any active infections, call and cancel the procedure. . Arrival: You must be in the facility at least 30 minutes prior to your scheduled procedure. . Children: Do not bring children with you. . Dress appropriately: Bring dark clothing that you would not mind if they get stained. . Valuables: Do not bring any jewelry or valuables.  Procedure  appointments are reserved for interventional treatments only. . No Prescription Refills. . No medication changes will be discussed during procedure appointments. . No disability issues will be discussed.  Reasons to call and reschedule or cancel your procedure: (Following these recommendations will minimize the risk of a serious complication.) . Surgeries: Avoid having procedures within 2 weeks of any surgery. (Avoid for 2 weeks before or after any surgery). . Flu Shots: Avoid having procedures within 2 weeks of a flu shots or . (Avoid for 2 weeks before or after immunizations). . Barium: Avoid having a procedure within 7-10 days after having had a radiological study involving the use of radiological contrast. (Myelograms, Barium swallow or enema study). . Heart attacks: Avoid any elective procedures or surgeries for the initial 6 months after a "Myocardial Infarction" (Heart Attack). . Blood thinners: It is imperative that you stop these medications before procedures. Let us know if you if you take any blood thinner.  . Infection: Avoid procedures during or within two weeks of an infection (including chest colds or gastrointestinal problems). Symptoms associated with infections include: Localized redness, fever, chills, night sweats or profuse sweating, burning sensation when voiding, cough, congestion, stuffiness, runny nose, sore throat, diarrhea, nausea, vomiting, cold or Flu symptoms, recent or current infections. It is specially important if the infection is over the area that we intend to treat. . Heart and lung problems: Symptoms that may suggest an active cardiopulmonary problem include: cough, chest pain, breathing difficulties or shortness of breath, dizziness, ankle swelling, uncontrolled high or unusually low blood pressure, and/or palpitations. If you are experiencing any of these symptoms, cancel   your procedure and contact your primary care physician for an evaluation.  Remember:   Regular Business hours are:  Monday to Thursday 8:00 AM to 4:00 PM  Provider's Schedule: Vandana Haman, MD:  Procedure days: Tuesday and Thursday 7:30 AM to 4:00 PM  Bilal Lateef, MD:  Procedure days: Monday and Wednesday 7:30 AM to 4:00 PM ____________________________________________________________________________________________    

## 2017-11-02 ENCOUNTER — Other Ambulatory Visit: Payer: Self-pay | Admitting: Pain Medicine

## 2017-11-02 DIAGNOSIS — E559 Vitamin D deficiency, unspecified: Secondary | ICD-10-CM

## 2017-11-06 ENCOUNTER — Encounter: Payer: Self-pay | Admitting: Pain Medicine

## 2017-11-06 ENCOUNTER — Ambulatory Visit (HOSPITAL_BASED_OUTPATIENT_CLINIC_OR_DEPARTMENT_OTHER): Payer: Medicare Other | Admitting: Pain Medicine

## 2017-11-06 ENCOUNTER — Ambulatory Visit
Admission: RE | Admit: 2017-11-06 | Discharge: 2017-11-06 | Disposition: A | Discharge status: Home / Self Care | Payer: Medicare Other | Source: Ambulatory Visit | Attending: Pain Medicine | Admitting: Pain Medicine

## 2017-11-06 ENCOUNTER — Other Ambulatory Visit: Payer: Self-pay

## 2017-11-06 VITALS — BP 95/82 | HR 76 | Temp 97.5°F | Resp 30 | Ht 65.0 in | Wt 202.0 lb

## 2017-11-06 DIAGNOSIS — M47812 Spondylosis without myelopathy or radiculopathy, cervical region: Secondary | ICD-10-CM | POA: Insufficient documentation

## 2017-11-06 DIAGNOSIS — Z91040 Latex allergy status: Secondary | ICD-10-CM

## 2017-11-06 DIAGNOSIS — G8929 Other chronic pain: Secondary | ICD-10-CM

## 2017-11-06 DIAGNOSIS — Z885 Allergy status to narcotic agent status: Secondary | ICD-10-CM | POA: Diagnosis not present

## 2017-11-06 DIAGNOSIS — Z9889 Other specified postprocedural states: Secondary | ICD-10-CM | POA: Diagnosis not present

## 2017-11-06 DIAGNOSIS — Z882 Allergy status to sulfonamides status: Secondary | ICD-10-CM | POA: Insufficient documentation

## 2017-11-06 DIAGNOSIS — M542 Cervicalgia: Secondary | ICD-10-CM

## 2017-11-06 DIAGNOSIS — Z880 Allergy status to penicillin: Secondary | ICD-10-CM | POA: Insufficient documentation

## 2017-11-06 DIAGNOSIS — Z90710 Acquired absence of both cervix and uterus: Secondary | ICD-10-CM | POA: Diagnosis not present

## 2017-11-06 MED ORDER — MIDAZOLAM HCL 5 MG/5ML IJ SOLN
1.0000 mg | INTRAMUSCULAR | Status: DC | PRN
  Administered 2017-11-06: 3 mg via INTRAVENOUS
  Filled 2017-11-06: qty 5

## 2017-11-06 MED ORDER — ROPIVACAINE HCL 2 MG/ML IJ SOLN
18.0000 mL | Freq: Once | INTRAMUSCULAR | Status: AC
  Administered 2017-11-06: 18 mL via PERINEURAL
  Filled 2017-11-06: qty 20

## 2017-11-06 MED ORDER — FENTANYL CITRATE (PF) 100 MCG/2ML IJ SOLN
25.0000 ug | INTRAMUSCULAR | Status: DC | PRN
  Administered 2017-11-06: 50 ug via INTRAVENOUS
  Filled 2017-11-06: qty 2

## 2017-11-06 MED ORDER — DEXAMETHASONE SODIUM PHOSPHATE 10 MG/ML IJ SOLN
20.0000 mg | Freq: Once | INTRAMUSCULAR | Status: AC
  Administered 2017-11-06: 20 mg
  Filled 2017-11-06: qty 2

## 2017-11-06 MED ORDER — LIDOCAINE HCL 2 % IJ SOLN
20.0000 mL | Freq: Once | INTRAMUSCULAR | Status: AC
  Administered 2017-11-06: 400 mg
  Filled 2017-11-06: qty 40

## 2017-11-06 MED ORDER — LACTATED RINGERS IV SOLN
1000.0000 mL | Freq: Once | INTRAVENOUS | Status: AC
  Administered 2017-11-06: 1000 mL via INTRAVENOUS

## 2017-11-06 NOTE — Patient Instructions (Signed)

## 2017-11-06 NOTE — Progress Notes (Signed)
Patient's Name: Rachael West  MRN: [id number]  Referring Provider: Esaw Grandchild, NP  DOB: 05-Sep-1955  PCP: Esaw Grandchild, NP  DOS: 11/06/2017  Note by: Gaspar Cola, MD  Service setting: Ambulatory outpatient  Specialty: Interventional Pain Management  Patient type: Established  Location: [facility] (AMB) Pain Management Facility  Visit type: Interventional Procedure   Primary Reason for Visit: Interventional Pain Management Treatment. CC: Neck Pain  Procedure:          Anesthesia, Analgesia, Anxiolysis:  Type: Cervical Facet Medial Branch Block(s)  #1  Primary Purpose: Diagnostic Region: Posterolateral cervical spine Level: C3, C4, C5, C6, & C7 Medial Branch Level(s). Injecting these levels blocks the C3-4, C4-5, C5-6, and C6-7 cervical facet joints. Laterality: Bilateral  Type: Moderate (Conscious) Sedation combined with Local Anesthesia Indication(s): Analgesia and Anxiety Route: Intravenous (IV) IV Access: Secured Sedation: Meaningful verbal contact was maintained at all times during the procedure  Local Anesthetic: Lidocaine 1-2%  Position: Prone with head of the table raised to facilitate breathing.   Indications: 1. Spondylosis without myelopathy or radiculopathy, cervical region   2. Cervicalgia   3. Cervical facet syndrome (Bilateral) (R>L)   4. Chronic neck pain (Fourth Area of Pain) (Bilateral) (L>R)   5. Latex precautions, history of latex allergy    Pain Score: Pre-procedure: 4 /10 Post-procedure: 0-No pain/10  Pre-op Assessment:  Rachael West is a 62 y.o. (year old), female patient, seen today for interventional treatment. She  has a past surgical history that includes Appendectomy; Trigger finger release; Esophagogastroduodenoscopy (01/17/2011); Colonoscopy (01/17/2011); Abdominal hysterectomy; and Breast biopsy (Left, 2015). Rachael West has a current medication list which includes the following prescription(s): albuterol, atenolol, buspirone, gnp  calcium 1200, vitamin d3, cyclobenzaprine, ergocalciferol, hydroxyzine, ipratropium-albuterol, linaclotide, lisinopril, magnesium, meloxicam, montelukast, omeprazole, paliperidone, polyethylene glycol, prazosin, sertraline, trazodone, vesicare, and tramadol, and the following Facility-Administered Medications: fentanyl and midazolam. Her primarily concern today is the Neck Pain  Initial Vital Signs:  Pulse/HCG Rate: 76ECG Heart Rate: 71 Temp: 98.7 F (37.1 C) Resp: 16 BP: 131/65 SpO2: 99 %  BMI: Estimated body mass index is 33.61 kg/m as calculated from the following:   Height as of this encounter: 5\' 5"  (1.651 m).   Weight as of this encounter: 202 lb (91.6 kg).  Risk Assessment: Allergies: Reviewed. She is allergic to latex; penicillins; codeine; cortisone; and sulfa antibiotics.  Allergy Precautions: Latex-free protocol activated Coagulopathies: Reviewed. None identified.  Blood-thinner therapy: None at this time Active Infection(s): Reviewed. None identified. Rachael West is West  Site Confirmation: Rachael West was asked to confirm the procedure and laterality before marking the site Procedure checklist: Completed Consent: Before the procedure and under the influence of no sedative(s), amnesic(s), or anxiolytics, the patient was informed of the treatment options, risks and possible complications. To fulfill our ethical and legal obligations, as recommended by the American Medical Association's Code of Ethics, I have informed the patient of my clinical impression; the nature and purpose of the treatment or procedure; the risks, benefits, and possible complications of the intervention; the alternatives, including doing nothing; the risk(s) and benefit(s) of the alternative treatment(s) or procedure(s); and the risk(s) and benefit(s) of doing nothing. The patient was provided information about the general risks and possible complications associated with the procedure. These  may include, but are not limited to: failure to achieve desired goals, infection, bleeding, organ or nerve damage, allergic reactions, paralysis, and death. In addition, the patient was informed of those risks and complications associated to [facility]  procedures, such as failure to decrease pain; infection (i.e.: Meningitis, epidural or intraspinal abscess); bleeding (i.e.: epidural hematoma, subarachnoid hemorrhage, or any other type of intraspinal or peri-dural bleeding); organ or nerve damage (i.e.: Any type of peripheral nerve, nerve root, or spinal cord injury) with subsequent damage to sensory, motor, and/or autonomic systems, resulting in permanent pain, numbness, and/or weakness of one or several areas of the body; allergic reactions; (i.e.: anaphylactic reaction); and/or death. Furthermore, the patient was informed of those risks and complications associated with the medications. These include, but are not limited to: allergic reactions (i.e.: anaphylactic or anaphylactoid reaction(s)); adrenal axis suppression; blood sugar elevation that in diabetics may result in ketoacidosis or comma; water retention that in patients with history of congestive heart failure may result in shortness of breath, pulmonary edema, and decompensation with resultant heart failure; weight gain; swelling or edema; medication-induced neural toxicity; particulate matter embolism and blood vessel occlusion with resultant organ, and/or nervous system infarction; and/or aseptic necrosis of one or more joints. Finally, the patient was informed that Medicine is not an exact science; therefore, there is also the possibility of unforeseen or unpredictable risks and/or possible complications that may result in a catastrophic outcome. The patient indicated having understood very clearly. We have given the patient no guarantees and we have made no promises. Enough time was given to the patient to ask questions, all of which were  answered to the patient's satisfaction. Rachael West has indicated that she wanted to continue with the procedure. Attestation: I, the ordering provider, attest that I have discussed with the patient the benefits, risks, side-effects, alternatives, likelihood of achieving goals, and potential problems during recovery for the procedure that I have provided informed consent. Date  Time: 11/06/2017  8:26 AM  Pre-Procedure Preparation:  Monitoring: As per clinic protocol. Respiration, ETCO2, SpO2, BP, heart rate and rhythm monitor placed and checked for adequate function Safety Precautions: Patient was assessed for positional comfort and pressure points before starting the procedure. Time-out: I initiated and conducted the "Time-out" before starting the procedure, as per protocol. The patient was asked to participate by confirming the accuracy of the "Time Out" information. Verification of the correct person, site, and procedure were performed and confirmed by me, the nursing staff, and the patient. "Time-out" conducted as per Joint Commission's Universal Protocol (UP.01.01.01). Time: 0900  Description of Procedure:          Laterality: Bilateral. The procedure was performed in identical fashion on both sides. Level: C3, C4, C5, C6, & C7 Medial Branch Level(s). Area Prepped: Posterior Cervico-thoracic Region Prepping solution: ChloraPrep (2% chlorhexidine gluconate and 70% isopropyl alcohol) Safety Precautions: Aspiration looking for blood return was conducted prior to all injections. At no point did we inject any substances, as a needle was being advanced. Before injecting, the patient was told to immediately notify me if she was experiencing any new onset of "ringing in the ears, or metallic taste in the mouth". No attempts were made at seeking any paresthesias. Safe injection practices and needle disposal techniques used. Medications properly checked for expiration dates. SDV (single dose vial)  medications used. After the completion of the procedure, all disposable equipment used was discarded in the proper designated medical waste containers. Local Anesthesia: Protocol guidelines were followed. The patient was positioned over the fluoroscopy table. The area was prepped in the usual manner. The time-out was completed. The target area was identified using fluoroscopy. A 12-in long, straight, sterile hemostat was used with fluoroscopic guidance to locate  the targets for each level blocked. Once located, the skin was marked with an approved surgical skin marker. Once all sites were marked, the skin (epidermis, dermis, and hypodermis), as well as deeper tissues (fat, connective tissue and muscle) were infiltrated with a small amount of a short-acting local anesthetic, loaded on a 10cc syringe with a 25G, 1.5-in  Needle. An appropriate amount of time was allowed for local anesthetics to take effect before proceeding to the next step. Local Anesthetic: Lidocaine 2.0% The unused portion of the local anesthetic was discarded in the proper designated containers. Technical explanation of process:  C3 Medial Branch Nerve Block (MBB): The target area for the C3 dorsal medial articular branch is the lateral concave waist of the articular pillar of C3. Under fluoroscopic guidance, a Quincke needle was inserted until contact was made with os over the postero-lateral aspect of the articular pillar of C3 (target area). After negative aspiration for blood, 0.5 mL of the nerve block solution was injected without difficulty or complication. The needle was removed intact. C4 Medial Branch Nerve Block (MBB): The target area for the C4 dorsal medial articular branch is the lateral concave waist of the articular pillar of C4. Under fluoroscopic guidance, a Quincke needle was inserted until contact was made with os over the postero-lateral aspect of the articular pillar of C4 (target area). After negative aspiration for  blood, 0.5 mL of the nerve block solution was injected without difficulty or complication. The needle was removed intact. C5 Medial Branch Nerve Block (MBB): The target area for the C5 dorsal medial articular branch is the lateral concave waist of the articular pillar of C5. Under fluoroscopic guidance, a Quincke needle was inserted until contact was made with os over the postero-lateral aspect of the articular pillar of C5 (target area). After negative aspiration for blood, 0.5 mL of the nerve block solution was injected without difficulty or complication. The needle was removed intact. C6 Medial Branch Nerve Block (MBB): The target area for the C6 dorsal medial articular branch is the lateral concave waist of the articular pillar of C6. Under fluoroscopic guidance, a Quincke needle was inserted until contact was made with os over the postero-lateral aspect of the articular pillar of C6 (target area). After negative aspiration for blood, 0.5 mL of the nerve block solution was injected without difficulty or complication. The needle was removed intact. C7 Medial Branch Nerve Block (MBB): The target for the C7 dorsal medial articular branch lies on the superior-medial tip of the C7 transverse process. Under fluoroscopic guidance, a Quincke needle was inserted until contact was made with os over the postero-lateral aspect of the articular pillar of C7 (target area). After negative aspiration for blood, 0.5 mL of the nerve block solution was injected without difficulty or complication. The needle was removed intact. Procedural Needles: 22-gauge, 3.5-inch, Quincke needles used for all levels. Nerve block solution: 0.2% PF-Ropivacaine + Triamcinolone (40 mg/mL) diluted to a final concentration of 4 mg of Triamcinolone/mL of Ropivacaine The unused portion of the solution was discarded in the proper designated containers.  Once the entire procedure was completed, the treated area was cleaned, making sure to leave  some of the prepping solution back to take advantage of its long term bactericidal properties.  Vitals:   11/06/17 0915 11/06/17 0926 11/06/17 0935 11/06/17 0945  BP: 122/76 128/74 120/66 95/82  Pulse:      Resp: 17 (!) 24 18 (!) 30  Temp:  97.8 F (36.6 C)  (!) 97.5  F (36.4 C)  TempSrc:  Temporal  Temporal  SpO2: 96% 96% 99% 100%  Weight:      Height:        Start Time: 0900 hrs. End Time: 0915 hrs.  Imaging Guidance (Spinal):          Type of Imaging Technique: Fluoroscopy Guidance (Spinal) Indication(s): Assistance in needle guidance and placement for procedures requiring needle placement in or near specific anatomical locations not easily accessible without such assistance. Exposure Time: Please see nurses notes. Contrast: None used. Fluoroscopic Guidance: I was personally present during the use of fluoroscopy. "Tunnel Vision Technique" used to obtain the best possible view of the target area. Parallax error corrected before commencing the procedure. "Direction-depth-direction" technique used to introduce the needle under continuous pulsed fluoroscopy. Once target was reached, antero-posterior, oblique, and lateral fluoroscopic projection used confirm needle placement in all planes. Images permanently stored in EMR. Interpretation: No contrast injected. I personally interpreted the imaging intraoperatively. Adequate needle placement confirmed in multiple planes. Permanent images saved into the patient's record.  Antibiotic Prophylaxis:   Anti-infectives (From admission, onward)   None     Indication(s): None identified  Post-operative Assessment:  Post-procedure Vital Signs:  Pulse/HCG Rate: 7682 Temp: (!) 97.5 F (36.4 C) Resp: (!) 30 BP: 95/82 SpO2: 100 %  EBL: None  Complications: No immediate post-treatment complications observed by team, or reported by patient.  Note: The patient tolerated the entire procedure well. A repeat set of vitals were taken after the  procedure and the patient was kept under observation following institutional policy, for this type of procedure. Post-procedural neurological assessment was performed, showing return to baseline, prior to discharge. The patient was provided with post-procedure discharge instructions, including a section on how to identify potential problems. Should any problems arise concerning this procedure, the patient was given instructions to immediately contact us, at any time, without hesitation. In any case, we plan to contact the patient by telephone for a follow-up status report regarding this interventional procedure.  Comments:  No additional relevant information.  Plan of Care    Imaging Orders     DG C-Arm 1-60 Min-No Report  Procedure Orders     CERVICAL FACET (MEDIAL BRANCH NERVE BLOCK)   Medications ordered for procedure: Meds ordered this encounter  Medications  . lidocaine (XYLOCAINE) 2 % (with pres) injection 400 mg  . midazolam (VERSED) 5 MG/5ML injection 1-2 mg    Make sure Flumazenil is available in the pyxis when using this medication. If oversedation occurs, administer 0.2 mg IV over 15 sec. If after 45 sec no response, administer 0.2 mg again over 1 min; may repeat at 1 min intervals; not to exceed 4 doses (1 mg)  . fentaNYL (SUBLIMAZE) injection 25-50 mcg    Make sure Narcan is available in the pyxis when using this medication. In the event of respiratory depression (RR< 8/min): Titrate NARCAN (naloxone) in increments of 0.1 to 0.2 mg IV at 2-3 minute intervals, until desired degree of reversal.  . lactated ringers infusion 1,000 mL  . ropivacaine (PF) 2 mg/mL (0.2%) (NAROPIN) injection 18 mL  . dexamethasone (DECADRON) injection 20 mg   Medications administered: We administered lidocaine, midazolam, fentaNYL, lactated ringers, ropivacaine (PF) 2 mg/mL (0.2%), and dexamethasone.  See the medical record for exact dosing, route, and time of administration.  Disposition:  Discharge home  Discharge Date & Time: 11/06/2017; 0945 hrs.   Physician-requested Follow-up: Return for post-procedure eval (2 wks), w/ Dr. Dossie Arbour.  Future  Appointments  Date Time Provider [place]  11/19/2017  2:00 PM Milinda Pointer, MD [facility] None  11/26/2017  8:45 AM [facility] None  11/28/2017  8:15 AM Danford, Valetta Fuller D, NP [facility] None  08/06/2018 10:00 AM [facility] None  08/06/2018 10:30 AM Nicholas Lose, MD [facility] None   Primary Care Physician: Esaw Grandchild, NP Location: [facility] Note by: Gaspar Cola, MD Date: 11/06/2017; Time: 10:07 AM  Disclaimer:  Medicine is not an exact science. The only guarantee in medicine is that nothing is guaranteed. It is important to note that the decision to proceed with this intervention was based on the information collected from the patient. The Data and conclusions were drawn from the patient's questionnaire, the interview, and the physical examination. Because the information was provided in large part by the patient, it cannot be guaranteed that it has not been purposely or unconsciously manipulated. Every effort has been made to obtain as much relevant data as possible for this evaluation. It is important to note that the conclusions that lead to this procedure are derived in large part from the available data. Always take into account that the treatment will also be dependent on availability of resources and existing treatment guidelines, considered by other Pain Management Practitioners as being common knowledge and practice, at the time of the intervention. For Medico-Legal purposes, it is also important to point out that variation in procedural techniques and pharmacological choices are the acceptable norm. The indications, contraindications, technique, and results of the above procedure should only be interpreted and judged by a Board-Certified  Interventional Pain Specialist with extensive familiarity and expertise in the same exact procedure and technique.

## 2017-11-06 NOTE — Progress Notes (Signed)
Safety precautions to be maintained throughout the outpatient stay will include: orient to surroundings, keep bed in low position, maintain call bell within reach at all times, provide assistance with transfer out of bed and ambulation.  

## 2017-11-07 ENCOUNTER — Telehealth: Payer: Self-pay

## 2017-11-07 NOTE — Telephone Encounter (Signed)
Post procedure phone call. Patient states she is doing good.  

## 2017-11-18 NOTE — Progress Notes (Signed)
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 Inspection: Skin color, temperature, and hair growth are WNL. No peripheral edema or cyanosis. No masses, redness, swelling, asymmetry, or associated skin lesions. No contractures.  Skin & Extremity Inspection: Skin color, temperature, and hair growth are WNL. No peripheral edema or cyanosis. No masses, redness, swelling, asymmetry, or associated skin lesions. No contractures.  Functional ROM: Unrestricted ROM                  Functional ROM: Unrestricted ROM                  Muscle Tone/Strength: Functionally intact. No obvious neuro-muscular anomalies detected.  Muscle Tone/Strength: Functionally intact. No obvious neuro-muscular anomalies detected.  Sensory (Neurological): Unimpaired  Sensory (Neurological): Unimpaired  Palpation: No palpable anomalies  Palpation: No palpable anomalies   Assessment  Primary Diagnosis & Pertinent Problem List: The  primary encounter diagnosis was Chronic neck pain (Fourth Area of Pain) (Bilateral) (L>R). Diagnoses of Chronic upper back pain (Tertiary Area of Pain) (Bilateral) (L>R), Cervicogenic headache, Chronic lower extremity pain (Secondary Area of Pain) (Left), Spondylosis without myelopathy or radiculopathy, cervical region, Cervical facet syndrome (Bilateral) (R>L), Spondylosis without myelopathy or radiculopathy, lumbar region, Lumbar facet syndrome (Bilateral) (L>R), Chronic low back pain (Primary Area of Pain) (Bilateral) w/ sciatica (Left), and Chronic pain syndrome were also pertinent to this visit.  Status Diagnosis  Controlled Controlled Controlled 1. Chronic neck pain (Fourth Area of Pain) (Bilateral) (L>R)   2. Chronic upper back pain [facility] Area of Pain) (Bilateral) (L>R)   3. Cervicogenic headache   4. Chronic lower extremity pain (Secondary Area of Pain) (Left)   5. Spondylosis without myelopathy or radiculopathy, cervical region   6. Cervical facet syndrome (Bilateral) (R>L)   7. Spondylosis without myelopathy or radiculopathy, lumbar region   8. Lumbar facet syndrome (Bilateral) (L>R)   9. 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 Chronic pain syndrome     Problems updated and reviewed during this visit:  No problems updated. Plan of Care  Pharmacotherapy (Medications Ordered): Meds ordered this encounter  Medications  . traMADol (ULTRAM) 50 MG tablet    Sig: Take 1 tablet (50 mg total) by mouth every 6 (six) hours as needed for severe pain.    Dispense:  120 tablet    Refill:  0    [place] STOP ACT - Not applicable. Fill one day early if pharmacy is closed on scheduled refill date. Do not fill until: 01/18/2018. Must last 30 days. To last until: 02/17/2018.   Medications administered today: [name] had no medications administered during this visit.   Procedure Orders     CERVICAL FACET (MEDIAL BRANCH NERVE BLOCK)  Lab  Orders  No laboratory test(s) ordered today   Imaging Orders  No imaging studies ordered today   Referral Orders  No referral(s) requested today   Interventional management options: Planned, scheduled, and/or pending:   Diagnostic bilateral lumbar facet block #2under fluoro and IV sedation  Diagnostic bilateral cervical facet block#2under fluoro and IV sedation   Considering:   Diagnostic left-sided lumbar epidural steroid injection Diagnostic bilateral lumbar facet block Possible bilateral lumbar facet RFA Diagnostic bilateral thoracic facet block Possiblebilateral thoracic facet RFA Diagnostic bilateral cervical facet block Possible bilateral cervical facet RFA Diagnostic bilateral greater occipital nerve blocks Diagnostic trigger point injections   Palliative PRN treatment(s):   Palliative/diagnostic bilateral lumbar facet block #2under fluoro and IV sedation  Palliative/diagnostic bilateral cervical facet block#2under fluoro and IV sedation   Provider-requested follow-up: Return for Med-Mgmt, w/ Dionisio David, NP, PRN Procedure.  Future Appointments  Date Time Provider [place]  11/26/2017  8:45 AM [facility] None  11/28/2017  8:15 AM Danford, Valetta Fuller D, NP [facility] None  08/06/2018 10:00 AM [facility] None  08/06/2018 10:30 AM Nicholas Lose, MD [facility] None   Primary Care Physician: Esaw Grandchild, NP Location: [facility] Note by: Gaspar Cola, MD Date: 11/19/2017; Time: 5:23 PM

## 2017-11-19 ENCOUNTER — Encounter: Payer: Self-pay | Admitting: Pain Medicine

## 2017-11-19 ENCOUNTER — Other Ambulatory Visit: Payer: Self-pay

## 2017-11-19 ENCOUNTER — Ambulatory Visit: Payer: Medicare Other | Attending: Pain Medicine | Admitting: Pain Medicine

## 2017-11-19 VITALS — BP 135/59 | HR 90 | Temp 98.7°F | Resp 18 | Ht 65.0 in | Wt 207.0 lb

## 2017-11-19 DIAGNOSIS — B182 Chronic viral hepatitis C: Secondary | ICD-10-CM | POA: Insufficient documentation

## 2017-11-19 DIAGNOSIS — M79605 Pain in left leg: Secondary | ICD-10-CM

## 2017-11-19 DIAGNOSIS — Z6839 Body mass index (BMI) 39.0-39.9, adult: Secondary | ICD-10-CM | POA: Diagnosis not present

## 2017-11-19 DIAGNOSIS — J4520 Mild intermittent asthma, uncomplicated: Secondary | ICD-10-CM | POA: Diagnosis not present

## 2017-11-19 DIAGNOSIS — F329 Major depressive disorder, single episode, unspecified: Secondary | ICD-10-CM | POA: Insufficient documentation

## 2017-11-19 DIAGNOSIS — M797 Fibromyalgia: Secondary | ICD-10-CM | POA: Insufficient documentation

## 2017-11-19 DIAGNOSIS — G894 Chronic pain syndrome: Secondary | ICD-10-CM

## 2017-11-19 DIAGNOSIS — E785 Hyperlipidemia, unspecified: Secondary | ICD-10-CM | POA: Diagnosis not present

## 2017-11-19 DIAGNOSIS — E079 Disorder of thyroid, unspecified: Secondary | ICD-10-CM | POA: Insufficient documentation

## 2017-11-19 DIAGNOSIS — F418 Other specified anxiety disorders: Secondary | ICD-10-CM | POA: Diagnosis not present

## 2017-11-19 DIAGNOSIS — M546 Pain in thoracic spine: Secondary | ICD-10-CM | POA: Diagnosis not present

## 2017-11-19 DIAGNOSIS — Z79899 Other long term (current) drug therapy: Secondary | ICD-10-CM | POA: Insufficient documentation

## 2017-11-19 DIAGNOSIS — D649 Anemia, unspecified: Secondary | ICD-10-CM | POA: Insufficient documentation

## 2017-11-19 DIAGNOSIS — M47812 Spondylosis without myelopathy or radiculopathy, cervical region: Secondary | ICD-10-CM | POA: Insufficient documentation

## 2017-11-19 DIAGNOSIS — Z79891 Long term (current) use of opiate analgesic: Secondary | ICD-10-CM | POA: Diagnosis not present

## 2017-11-19 DIAGNOSIS — G4486 Cervicogenic headache: Secondary | ICD-10-CM

## 2017-11-19 DIAGNOSIS — M549 Dorsalgia, unspecified: Secondary | ICD-10-CM

## 2017-11-19 DIAGNOSIS — K219 Gastro-esophageal reflux disease without esophagitis: Secondary | ICD-10-CM | POA: Insufficient documentation

## 2017-11-19 DIAGNOSIS — M5442 Lumbago with sciatica, left side: Secondary | ICD-10-CM | POA: Diagnosis not present

## 2017-11-19 DIAGNOSIS — K7460 Unspecified cirrhosis of liver: Secondary | ICD-10-CM | POA: Diagnosis not present

## 2017-11-19 DIAGNOSIS — G8929 Other chronic pain: Secondary | ICD-10-CM

## 2017-11-19 DIAGNOSIS — C8510 Unspecified B-cell lymphoma, unspecified site: Secondary | ICD-10-CM | POA: Insufficient documentation

## 2017-11-19 DIAGNOSIS — R51 Headache: Secondary | ICD-10-CM | POA: Insufficient documentation

## 2017-11-19 DIAGNOSIS — E669 Obesity, unspecified: Secondary | ICD-10-CM | POA: Diagnosis not present

## 2017-11-19 DIAGNOSIS — M47816 Spondylosis without myelopathy or radiculopathy, lumbar region: Secondary | ICD-10-CM

## 2017-11-19 DIAGNOSIS — M542 Cervicalgia: Secondary | ICD-10-CM | POA: Insufficient documentation

## 2017-11-19 DIAGNOSIS — M25511 Pain in right shoulder: Secondary | ICD-10-CM | POA: Diagnosis present

## 2017-11-19 MED ORDER — TRAMADOL HCL 50 MG PO TABS: 50.0000 mg | ORAL_TABLET | Freq: Four times a day (QID) | ORAL | 0 refills | Status: DC | PRN

## 2017-11-19 NOTE — Progress Notes (Signed)
Nursing Pain Medication Assessment:  Safety precautions to be maintained throughout the outpatient stay will include: orient to surroundings, keep bed in low position, maintain call bell within reach at all times, provide assistance with transfer out of bed and ambulation.  Medication Inspection Compliance: Pill count conducted under aseptic conditions, in front of the patient. Neither the pills nor the bottle was removed from the patient's sight at any time. Once count was completed pills were immediately returned to the patient in their original bottle.  Medication: Tramadol (Ultram) Pill/Patch Count: 241 of 296 pills remain Pill/Patch Appearance: Markings consistent with prescribed medication Bottle Appearance: Standard pharmacy container. Clearly labeled. Filled Date: 69 / 08 / 2019 Last Medication intake:  Today

## 2017-11-19 NOTE — Patient Instructions (Addendum)
____________________________________________________________________________________________  Preparing for Procedure with Sedation  Instructions: . Oral Intake: Do not eat or drink anything for at least 8 hours prior to your procedure. . Transportation: Public transportation is not allowed. Bring an adult driver. The driver must be physically present in our waiting room before any procedure can be started. . Physical Assistance: Bring an adult physically capable of assisting you, in the event you need help. This adult should keep you company at home for at least 6 hours after the procedure. . Blood Pressure Medicine: Take your blood pressure medicine with a sip of water the morning of the procedure. . Blood thinners: Notify our staff if you are taking any blood thinners. Depending on which one you take, there will be specific instructions on how and when to stop it. . Diabetics on insulin: Notify the staff so that you can be scheduled 1st case in the morning. If your diabetes requires high dose insulin, take only  of your normal insulin dose the morning of the procedure and notify the staff that you have done so. . Preventing infections: Shower with an antibacterial soap the morning of your procedure. . Build-up your immune system: Take 1000 mg of Vitamin C with every meal (3 times a day) the day prior to your procedure. . Antibiotics: Inform the staff if you have a condition or reason that requires you to take antibiotics before dental procedures. . Pregnancy: If you are pregnant, call and cancel the procedure. . Sickness: If you have a cold, fever, or any active infections, call and cancel the procedure. . Arrival: You must be in the facility at least 30 minutes prior to your scheduled procedure. . Children: Do not bring children with you. . Dress appropriately: Bring dark clothing that you would not mind if they get stained. . Valuables: Do not bring any jewelry or valuables.  Procedure  appointments are reserved for interventional treatments only. . No Prescription Refills. . No medication changes will be discussed during procedure appointments. . No disability issues will be discussed.  Reasons to call and reschedule or cancel your procedure: (Following these recommendations will minimize the risk of a serious complication.) . Surgeries: Avoid having procedures within 2 weeks of any surgery. (Avoid for 2 weeks before or after any surgery). . Flu Shots: Avoid having procedures within 2 weeks of a flu shots or . (Avoid for 2 weeks before or after immunizations). . Barium: Avoid having a procedure within 7-10 days after having had a radiological study involving the use of radiological contrast. (Myelograms, Barium swallow or enema study). . Heart attacks: Avoid any elective procedures or surgeries for the initial 6 months after a "Myocardial Infarction" (Heart Attack). . Blood thinners: It is imperative that you stop these medications before procedures. Let us know if you if you take any blood thinner.  . Infection: Avoid procedures during or within two weeks of an infection (including chest colds or gastrointestinal problems). Symptoms associated with infections include: Localized redness, fever, chills, night sweats or profuse sweating, burning sensation when voiding, cough, congestion, stuffiness, runny nose, sore throat, diarrhea, nausea, vomiting, cold or Flu symptoms, recent or current infections. It is specially important if the infection is over the area that we intend to treat. . Heart and lung problems: Symptoms that may suggest an active cardiopulmonary problem include: cough, chest pain, breathing difficulties or shortness of breath, dizziness, ankle swelling, uncontrolled high or unusually low blood pressure, and/or palpitations. If you are experiencing any of these symptoms, cancel   your procedure and contact your primary care physician for an evaluation.  Remember:   Regular Business hours are:  Monday to Thursday 8:00 AM to 4:00 PM  Provider's Schedule: Milinda Pointer, MD:  Procedure days: Tuesday and Thursday 7:30 AM to 4:00 PM  Gillis Santa, MD:  Procedure days: Monday and Wednesday 7:30 AM to 4:00 PM ____________________________________________________________________________________________   GENERAL RISKS AND COMPLICATIONS  What are the risk, side effects and possible complications? Generally speaking, most procedures are safe.  However, with any procedure there are risks, side effects, and the possibility of complications.  The risks and complications are dependent upon the sites that are lesioned, or the type of nerve block to be performed.  The closer the procedure is to the spine, the more serious the risks are.  Great care is taken when placing the radio frequency needles, block needles or lesioning probes, but sometimes complications can occur. 1. Infection: Any time there is an injection through the skin, there is a risk of infection.  This is why sterile conditions are used for these blocks.  There are four possible types of infection. 1. Localized skin infection. 2. Central Nervous System Infection-This can be in the form of Meningitis, which can be deadly. 3. Epidural Infections-This can be in the form of an epidural abscess, which can cause pressure inside of the spine, causing compression of the spinal cord with subsequent paralysis. This would require an emergency surgery to decompress, and there are no guarantees that the patient would recover from the paralysis. 4. Discitis-This is an infection of the intervertebral discs.  It occurs in about 1% of discography procedures.  It is difficult to treat and it may lead to surgery.        2. Pain: the needles have to go through skin and soft tissues, will cause soreness.       3. Damage to internal structures:  The nerves to be lesioned may be near blood vessels or    other nerves which  can be potentially damaged.       4. Bleeding: Bleeding is more common if the patient is taking blood thinners such as  aspirin, Coumadin, Ticiid, Plavix, etc., or if he/she have some genetic predisposition  such as hemophilia. Bleeding into the spinal canal can cause compression of the spinal  cord with subsequent paralysis.  This would require an emergency surgery to  decompress and there are no guarantees that the patient would recover from the  paralysis.       5. Pneumothorax:  Puncturing of a lung is a possibility, every time a needle is introduced in  the area of the chest or upper back.  Pneumothorax refers to free air around the  collapsed lung(s), inside of the thoracic cavity (chest cavity).  Another two possible  complications related to a similar event would include: Hemothorax and Chylothorax.   These are variations of the Pneumothorax, where instead of air around the collapsed  lung(s), you may have blood or chyle, respectively.       6. Spinal headaches: They may occur with any procedures in the area of the spine.       7. Persistent CSF (Cerebro-Spinal Fluid) leakage: This is a rare problem, but may occur  with prolonged intrathecal or epidural catheters either due to the formation of a fistulous  track or a dural tear.       8. Nerve damage: By working so close to the spinal cord, there is always a possibility  of  nerve damage, which could be as serious as a permanent spinal cord injury with  paralysis.       9. Death:  Although rare, severe deadly allergic reactions known as "Anaphylactic  reaction" can occur to any of the medications used.      10. Worsening of the symptoms:  We can always make thing worse.  What are the chances of something like this happening? Chances of any of this occuring are extremely low.  By statistics, you have more of a chance of getting killed in a motor vehicle accident: while driving to the hospital than any of the above occurring .  Nevertheless, you  should be aware that they are possibilities.  In general, it is similar to taking a shower.  Everybody knows that you can slip, hit your head and get killed.  Does that mean that you should not shower again?  Nevertheless always keep in mind that statistics do not mean anything if you happen to be on the wrong side of them.  Even if a procedure has a 1 (one) in a 1,000,000 (million) chance of going wrong, it you happen to be that one..Also, keep in mind that by statistics, you have more of a chance of having something go wrong when taking medications.  Who should not have this procedure? If you are on a blood thinning medication (e.g. Coumadin, Plavix, see list of "Blood Thinners"), or if you have an active infection going on, you should not have the procedure.  If you are taking any blood thinners, please inform your physician.  How should I prepare for this procedure?  Do not eat or drink anything at least six hours prior to the procedure.  Bring a driver with you .  It cannot be a taxi.  Come accompanied by an adult that can drive you back, and that is strong enough to help you if your legs get weak or numb from the local anesthetic.  Take all of your medicines the morning of the procedure with just enough water to swallow them.  If you have diabetes, make sure that you are scheduled to have your procedure done first thing in the morning, whenever possible.  If you have diabetes, take only half of your insulin dose and notify our nurse that you have done so as soon as you arrive at the clinic.  If you are diabetic, but only take blood sugar pills (oral hypoglycemic), then do not take them on the morning of your procedure.  You may take them after you have had the procedure.  Do not take aspirin or any aspirin-containing medications, at least eleven (11) days prior to the procedure.  They may prolong bleeding.  Wear loose fitting clothing that may be easy to take off and that you would not  mind if it got stained with Betadine or blood.  Do not wear any jewelry or perfume  Remove any nail coloring.  It will interfere with some of our monitoring equipment.  NOTE: Remember that this is not meant to be interpreted as a complete list of all possible complications.  Unforeseen problems may occur.  BLOOD THINNERS The following drugs contain aspirin or other products, which can cause increased bleeding during surgery and should not be taken for 2 weeks prior to and 1 week after surgery.  If you should need take something for relief of minor pain, you may take acetaminophen which is found in Tylenol,m Datril, Anacin-3 and Panadol. It is  not blood thinner. The products listed below are.  Do not take any of the products listed below in addition to any listed on your instruction sheet.  A.P.C or A.P.C with Codeine Codeine Phosphate Capsules #3 Ibuprofen Ridaura  ABC compound Congesprin Imuran rimadil  Advil Cope Indocin Robaxisal  Alka-Seltzer Effervescent Pain Reliever and Antacid Coricidin or Coricidin-D  Indomethacin Rufen  Alka-Seltzer plus Cold Medicine Cosprin Ketoprofen S-A-C Tablets  Anacin Analgesic Tablets or Capsules Coumadin Korlgesic Salflex  Anacin Extra Strength Analgesic tablets or capsules CP-2 Tablets Lanoril Salicylate  Anaprox Cuprimine Capsules Levenox Salocol  Anexsia-D Dalteparin Magan Salsalate  Anodynos Darvon compound Magnesium Salicylate Sine-off  Ansaid Dasin Capsules Magsal Sodium Salicylate  Anturane Depen Capsules Marnal Soma  APF Arthritis pain formula Dewitt's Pills Measurin Stanback  Argesic Dia-Gesic Meclofenamic Sulfinpyrazone  Arthritis Bayer Timed Release Aspirin Diclofenac Meclomen Sulindac  Arthritis pain formula Anacin Dicumarol Medipren Supac  Analgesic (Safety coated) Arthralgen Diffunasal Mefanamic Suprofen  Arthritis Strength Bufferin Dihydrocodeine Mepro Compound Suprol  Arthropan liquid Dopirydamole Methcarbomol with Aspirin Synalgos   ASA tablets/Enseals Disalcid Micrainin Tagament  Ascriptin Doan's Midol Talwin  Ascriptin A/D Dolene Mobidin Tanderil  Ascriptin Extra Strength Dolobid Moblgesic Ticlid  Ascriptin with Codeine Doloprin or Doloprin with Codeine Momentum Tolectin  Asperbuf Duoprin Mono-gesic Trendar  Aspergum Duradyne Motrin or Motrin IB Triminicin  Aspirin plain, buffered or enteric coated Durasal Myochrisine Trigesic  Aspirin Suppositories Easprin Nalfon Trillsate  Aspirin with Codeine Ecotrin Regular or Extra Strength Naprosyn Uracel  Atromid-S Efficin Naproxen Ursinus  Auranofin Capsules Elmiron Neocylate Vanquish  Axotal Emagrin Norgesic Verin  Azathioprine Empirin or Empirin with Codeine Normiflo Vitamin E  Azolid Emprazil Nuprin Voltaren  Bayer Aspirin plain, buffered or children's or timed BC Tablets or powders Encaprin Orgaran Warfarin Sodium  Buff-a-Comp Enoxaparin Orudis Zorpin  Buff-a-Comp with Codeine Equegesic Os-Cal-Gesic   Buffaprin Excedrin plain, buffered or Extra Strength Oxalid   Bufferin Arthritis Strength Feldene Oxphenbutazone   Bufferin plain or Extra Strength Feldene Capsules Oxycodone with Aspirin   Bufferin with Codeine Fenoprofen Fenoprofen Pabalate or Pabalate-SF   Buffets II Flogesic Panagesic   Buffinol plain or Extra Strength Florinal or Florinal with Codeine Panwarfarin   Buf-Tabs Flurbiprofen Penicillamine   Butalbital Compound Four-way cold tablets Penicillin   Butazolidin Fragmin Pepto-Bismol   Carbenicillin Geminisyn Percodan   Carna Arthritis Reliever Geopen Persantine   Carprofen Gold's salt Persistin   Chloramphenicol Goody's Phenylbutazone   Chloromycetin Haltrain Piroxlcam   Clmetidine heparin Plaquenil   Cllnoril Hyco-pap Ponstel   Clofibrate Hydroxy chloroquine Propoxyphen         Before stopping any of these medications, be sure to consult the physician who ordered them.  Some, such as Coumadin (Warfarin) are ordered to prevent or treat serious  conditions such as "deep thrombosis", "pumonary embolisms", and other heart problems.  The amount of time that you may need off of the medication may also vary with the medication and the reason for which you were taking it.  If you are taking any of these medications, please make sure you notify your pain physician before you undergo any procedures.         Facet Blocks Patient Information  Description: The facets are joints in the spine between the vertebrae.  Like any joints in the body, facets can become irritated and painful.  Arthritis can also effect the facets.  By injecting steroids and local anesthetic in and around these joints, we can temporarily block the nerve supply to them.  Steroids act  directly on irritated nerves and tissues to reduce selling and inflammation which often leads to decreased pain.  Facet blocks may be done anywhere along the spine from the neck to the low back depending upon the location of your pain.   After numbing the skin with local anesthetic (like Novocaine), a small needle is passed onto the facet joints under x-ray guidance.  You may experience a sensation of pressure while this is being done.  The entire block usually lasts about 15-25 minutes.   Conditions which may be treated by facet blocks:   Low back/buttock pain  Neck/shoulder pain  Certain types of headaches  Preparation for the injection:  1. Do not eat any solid food or dairy products within 8 hours of your appointment. 2. You may drink clear liquid up to 3 hours before appointment.  Clear liquids include water, black coffee, juice or soda.  No milk or cream please. 3. You may take your regular medication, including pain medications, with a sip of water before your appointment.  Diabetics should hold regular insulin (if taken separately) and take 1/2 normal NPH dose the morning of the procedure.  Carry some sugar containing items with you to your appointment. 4. A driver must accompany  you and be prepared to drive you home after your procedure. 5. Bring all your current medications with you. 6. An IV may be inserted and sedation may be given at the discretion of the physician. 7. A blood pressure cuff, EKG and other monitors will often be applied during the procedure.  Some patients may need to have extra oxygen administered for a short period. 8. You will be asked to provide medical information, including your allergies and medications, prior to the procedure.  We must know immediately if you are taking blood thinners (like Coumadin/Warfarin) or if you are allergic to IV iodine contrast (dye).  We must know if you could possible be pregnant.  Possible side-effects:   Bleeding from needle site  Infection (rare, may require surgery)  Nerve injury (rare)  Numbness & tingling (temporary)  Difficulty urinating (rare, temporary)  Spinal headache (a headache worse with upright posture)  Light-headedness (temporary)  Pain at injection site (serveral days)  Decreased blood pressure (rare, temporary)  Weakness in arm/leg (temporary)  Pressure sensation in back/neck (temporary)   Call if you experience:   Fever/chills associated with headache or increased back/neck pain  Headache worsened by an upright position  New onset, weakness or numbness of an extremity below the injection site  Hives or difficulty breathing (go to the emergency room)  Inflammation or drainage at the injection site(s)  Severe back/neck pain greater than usual  New symptoms which are concerning to you  Please note:  Although the local anesthetic injected can often make your back or neck feel good for several hours after the injection, the pain will likely return. It takes 3-7 days for steroids to work.  You may not notice any pain relief for at least one week.  If effective, we will often do a series of 2-3 injections spaced 3-6 weeks apart to maximally decrease your pain.  After the  initial series, you may be a candidate for a more permanent nerve block of the facets.  If you have any questions, please call #[phone] [facility]

## 2017-11-26 ENCOUNTER — Other Ambulatory Visit: Payer: Medicare Other

## 2017-11-26 ENCOUNTER — Other Ambulatory Visit: Payer: Self-pay

## 2017-11-26 DIAGNOSIS — E785 Hyperlipidemia, unspecified: Secondary | ICD-10-CM

## 2017-11-26 NOTE — Progress Notes (Signed)
.  li

## 2017-11-26 NOTE — Progress Notes (Signed)
Subjective:    Patient ID: Rachael West, female    DOB: 1955/07/12, 62 y.o.   MRN: [id number]  HPI:  Rachael West is here for CPE She reports medication compliance, denies SE Regular f/u by Pain Med, Oncology, Rheumatology, Psychiatry She attends weekly "group meetings for my PTSD" She denies thoughts of harming herself/others She reports falling when rising too quickly or walking too quickly around turns. She ambulates with cane. She reports that her Rheumatologist recently dx'd her with Fibromyalgia Last course of PT was May 2019, she is agreeable to another course.  Reviewed recent labs: The 10-year ASCVD risk score Rachael West DC Rachael West., et al., 2013) is: 10.6%   Values used to calculate the score:     Age: 2 years     Sex: Female     Is Non-Hispanic African American: Yes     Diabetic: No     Tobacco smoker: No     Systolic Blood Pressure: 782 mmHg     Is BP treated: Yes     HDL Cholesterol: 37 mg/dL     Total Cholesterol: 216 mg/dL  LDL-154  Healthcare Maintenance: PAP-N/A Hsyterectomy Mammogram-UTD, WNL Immunizations-UTD LDCT-N/A Colonoscopy-UTD, due 2023  Patient Care Team    Relationship Specialty Notifications Start End  Esaw Grandchild, NP PCP - General Family Medicine  03/21/17   Laurence Spates, MD Consulting Physician Gastroenterology  12/01/14   Doran Stabler, MD Consulting Physician Gastroenterology  05/01/17   Elsie Saas, [place] Physician Orthopedic Surgery  05/01/17     Patient Active Problem List   Diagnosis Date Noted  . Gait disturbance 11/28/2017  . Cervical facet syndrome (Bilateral) (R>L) 10/24/2017  . Spondylosis without myelopathy or radiculopathy, cervical region 10/24/2017  . Latex precautions, history of latex allergy 10/09/2017  . Elevated uric acid in blood 09/27/2017  . Chronic idiopathic gout 09/27/2017  . DDD (degenerative disc disease), cervical 09/27/2017  . 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 Chronic shoulder pain (Bilateral) (R>L) 09/26/2017  . Chronic hip pain (Bilateral) (L>R) 09/26/2017  . Cervicalgia 09/26/2017  . DDD (degenerative disc disease), lumbar 09/25/2017  . Lumbar facet arthropathy 09/25/2017  . Lumbar facet syndrome (Bilateral) (L>R) 09/25/2017  . Spondylosis without myelopathy or radiculopathy, lumbar region 09/25/2017  . DDD (degenerative disc disease), thoracic 09/25/2017  . Thoracolumbar dextroscoliosis 09/25/2017  . Vitamin D deficiency 09/25/2017  . Cervicogenic headache 09/25/2017  . Other dysphagia 09/24/2017  . Tendinitis of left rotator cuff 09/24/2017  . Tendinitis of right rotator cuff 09/24/2017  . Fibromyalgia 09/24/2017  . Elevated C-reactive protein (CRP) 09/03/2017  . Elevated sed rate 09/03/2017  . Chronic low back pain (Primary Area of Pain) (Bilateral) w/ sciatica (Left) 08/29/2017  . Chronic lower extremity pain (Secondary Area of Pain) (Left) 08/29/2017  . Chronic upper back pain [facility] Area of Pain) (Bilateral) (L>R) 08/29/2017  . Chronic neck pain (Fourth Area of Pain) (Bilateral) (L>R) 08/29/2017  . Occipital headache 08/29/2017  . Chronic knee pain (Bilateral) (R>L) 08/29/2017  . Chronic pain syndrome 08/29/2017  . Pharmacologic therapy 08/29/2017  . Long term current use of opiate analgesic 08/29/2017  . Disorder of skeletal system 08/29/2017  . Problems influencing health status 08/29/2017  . Chronic sacroiliac joint pain 08/29/2017  . Hyperlipidemia 07/31/2017  . GERD (gastroesophageal reflux disease) 05/01/2017  . Anxiety 05/01/2017  . Depression 05/01/2017  . Healthcare maintenance 05/01/2017  . Chronic idiopathic constipation 05/01/2017  . Dyspnea 10/22/2014  . Obesity (BMI  30-39.9) 10/22/2014  . Obesity 09/12/2014  . Essential hypertension 09/12/2014  . Pneumonia, lobar ([place]) 09/11/2014  . Intermittent asthma 09/11/2014  . Cirrhosis ([place]) 09/08/2014  . Chronic hepatitis C without hepatic coma  ([place]) 01/15/2014  . Large B-cell lymphoma ([place]) 01/12/2014  . Constipation 04/24/2012  . Anemia 05/25/2011  . Atypical chest pain 05/25/2011  . History of thyroid disease 05/25/2011  . Hepatitis C 05/25/2011  . Benign hypertension 05/25/2011  . Gastritis 05/25/2011  . Asthma 05/25/2011  . Stress incontinence 02/10/2011  . Urge incontinence 02/10/2011  . Osteoarthrosis involving multiple sites 10/17/2010     Past Medical History:  Diagnosis Date  . Anemia   . Anxiety   . Arthritis   . Asthma   . Depression   . GERD (gastroesophageal reflux disease)   . Hepatitis-C   . History of chemotherapy   . Hypertension   . Insomnia   . Lymphoma ([place]) 12/2013   aggressive lymphoma  . Personal history of non-Hodgkin lymphomas   . Vitamin D deficiency      Past Surgical History:  Procedure Laterality Date  . ABDOMINAL HYSTERECTOMY    . APPENDECTOMY    . BREAST BIOPSY Left 2015   lymphoma  . COLONOSCOPY  01/17/2011   Procedure: COLONOSCOPY;  Surgeon: Winfield Cunas., MD;  Location: [facility];  Service: Endoscopy;  Laterality: N/A;  . ESOPHAGOGASTRODUODENOSCOPY  01/17/2011   Procedure: ESOPHAGOGASTRODUODENOSCOPY (EGD);  Surgeon: Winfield Cunas., MD;  Location: [facility];  Service: Endoscopy;  Laterality: N/A;  . TRIGGER FINGER RELEASE       Family History  Problem Relation Age of Onset  . Asthma Mother   . Allergies Mother   . Asthma Sister   . Heart disease Father   . Heart disease Brother   . Diabetes Maternal Grandmother   . Diabetes Maternal Grandfather   . Heart disease Paternal Grandmother   . Heart disease Paternal Grandfather   . Colon cancer Paternal Aunt   . Liver cancer Paternal Uncle   . Breast cancer Cousin   . Ovarian cancer Other        great aunt (m)  . Pancreatic cancer Other        great aunt (m)     Social History   Substance and Sexual Activity  Drug Use No     Social History   Substance and Sexual Activity  Alcohol Use No   . Alcohol/week: 0.0 standard drinks     Social History   Tobacco Use  Smoking Status Former Smoker  . Packs/day: 1.50  . Years: 20.00  . Pack years: 30.00  . Types: Cigarettes  . Last attempt to quit: 01/09/1990  . Years since quitting: 27.9  Smokeless Tobacco Never Used     Outpatient Encounter Medications as of 11/28/2017  Medication Sig  . albuterol (PROVENTIL HFA;VENTOLIN HFA) 108 (90 Base) MCG/ACT inhaler Inhale 2 puffs into the lungs 4 (four) times daily.  Marland Kitchen atenolol (TENORMIN) 25 MG tablet Take 1 tablet (25 mg total) by mouth daily.  . busPIRone (BUSPAR) 5 MG tablet Take 1 tablet (5 mg total) by mouth 2 (two) times daily. (Patient taking differently: Take 5 mg by mouth 3 (three) times daily. )  . Calcium Carbonate-Vit D-Min (GNP CALCIUM 1200) 1200-1000 MG-UNIT CHEW Chew 1,200 mg by mouth daily with breakfast. Take in combination with vitamin D and magnesium.  . Cholecalciferol (VITAMIN D3) 5000 units CAPS Take 1 capsule (5,000 Units total) by mouth daily  with breakfast. Take along with calcium and magnesium.  . cyclobenzaprine (FLEXERIL) 5 MG tablet Take 1 tablet by mouth at bedtime.  . hydrOXYzine (VISTARIL) 50 MG capsule Take 50 mg by mouth at bedtime.  Marland Kitchen ipratropium-albuterol (DUONEB) 0.5-2.5 (3) MG/3ML SOLN Inhale 3 mLs into the lungs See admin instructions.  Marland Kitchen linaclotide (LINZESS) 72 MCG capsule Take 1 capsule (72 mcg total) by mouth daily before breakfast.  . lisinopril (PRINIVIL,ZESTRIL) 5 MG tablet Take 1 tablet (5 mg total) by mouth daily.  . Magnesium 500 MG CAPS Take 1 capsule (500 mg total) by mouth 2 (two) times daily at 8 am and 10 pm.  . meloxicam (MOBIC) 15 MG tablet Take 15 mg by mouth daily.  . montelukast (SINGULAIR) 10 MG tablet Take 1 tablet (10 mg total) by mouth daily.  Marland Kitchen omeprazole (PRILOSEC) 40 MG capsule 1 CAPSULE BY MOUTH DAILY  . paliperidone (INVEGA) 6 MG 24 hr tablet Take 6 mg by mouth every morning.  . polyethylene glycol (MIRALAX / GLYCOLAX)  packet Take 17 g by mouth 2 (two) times daily.  . prazosin (MINIPRESS) 1 MG capsule Take 1 mg by mouth at bedtime.  . sertraline (ZOLOFT) 100 MG tablet Take 200 mg by mouth every morning.  Derrill Memo ON 01/18/2018] traMADol (ULTRAM) 50 MG tablet Take 1 tablet (50 mg total) by mouth every 6 (six) hours as needed for severe pain.  . traZODone (DESYREL) 50 MG tablet 1 TABLET BY MOUTH AT BEDTIME  . VESICARE 10 MG tablet Take 10 mg by mouth daily.  . [DISCONTINUED] atenolol (TENORMIN) 25 MG tablet Take 25 mg by mouth daily.  . [DISCONTINUED] lisinopril (PRINIVIL,ZESTRIL) 5 MG tablet Take 5 mg by mouth daily.   No facility-administered encounter medications on file as of 11/28/2017.     Allergies: Latex; Penicillins; Codeine; Cortisone; and Sulfa antibiotics  Body mass index is 32.1 kg/m.  Blood pressure 129/79, pulse 93, temperature 98.7 F (37.1 C), temperature source Oral, height 5' 5.75" (1.67 m), weight 197 lb 6.4 oz (89.5 kg), SpO2 98 %.     Review of Systems  Constitutional: Positive for fatigue. Negative for activity change, appetite change, chills, diaphoresis, fever and unexpected weight change.  Eyes: Negative for visual disturbance.  Respiratory: Negative for cough, chest tightness, shortness of breath, wheezing and stridor.   Cardiovascular: Negative for chest pain, palpitations and leg swelling.  Gastrointestinal: Positive for constipation. Negative for abdominal distention, anal bleeding, blood in stool, diarrhea, nausea and vomiting.  Genitourinary: Positive for difficulty urinating. Negative for dysuria.  Musculoskeletal: Positive for arthralgias, back pain, gait problem, joint swelling, myalgias, neck pain and neck stiffness.  Skin: Negative for color change, pallor, rash and wound.  Neurological: Negative for dizziness and headaches.  Hematological: Does not bruise/bleed easily.  Psychiatric/Behavioral: Positive for dysphoric mood and sleep disturbance. Negative for  agitation, behavioral problems, confusion, decreased concentration, hallucinations, self-injury and suicidal ideas. The patient is nervous/anxious. The patient is not hyperactive.        Objective:   Physical Exam  Constitutional: She is oriented to person, place, and time. She appears well-developed and well-nourished. No distress.  HENT:  Head: Normocephalic and atraumatic.  Right Ear: External ear normal. Decreased hearing is noted.  Left Ear: External ear normal. Decreased hearing is noted.  Nose: Nose normal.  Mouth/Throat: Oropharynx is clear and moist.  Bil hearing aids  Eyes: Pupils are equal, round, and reactive to light. Conjunctivae and EOM are normal.  Neck: Normal range of motion. Neck  supple.  Cardiovascular: Normal rate, regular rhythm, normal heart sounds and intact distal pulses.  No murmur heard. Pulmonary/Chest: Effort normal and breath sounds normal. No stridor. No respiratory distress. She has no decreased breath sounds. She has no wheezes. She has no rhonchi. She has no rales. She exhibits no tenderness. Right breast exhibits no inverted nipple, no mass, no nipple discharge, no skin change and no tenderness. Left breast exhibits no inverted nipple, no mass, no nipple discharge, no skin change and no tenderness.  Abdominal: Soft. Bowel sounds are normal. She exhibits no distension and no mass. There is no tenderness. There is no rebound and no guarding. No hernia.  Musculoskeletal: She exhibits tenderness.       Cervical back: She exhibits tenderness.       Thoracic back: She exhibits tenderness.       Lumbar back: She exhibits tenderness.  Lymphadenopathy:    She has no cervical adenopathy.  Neurological: She is alert and oriented to person, place, and time.  Skin: Skin is dry. Capillary refill takes less than 2 seconds. No rash noted. She is not diaphoretic. No erythema. No pallor.  Psychiatric: She has a normal mood and affect. Her behavior is normal. Judgment and  thought content normal.  Nursing note and vitals reviewed.     Assessment & Plan:   1. Gait disturbance   2. Chronic hepatitis C without hepatic coma ([place])   3. Essential hypertension   4. Healthcare maintenance   5. Benign hypertension   6. Hyperlipidemia, unspecified hyperlipidemia type   7. Depression, unspecified depression type   8. Anxiety     Essential hypertension BP at goal 129/79, HR 93 Continue Lisinopril 5mg  QD, Atenolol 25mg  QD Denies acute cardiac sx's  Hyperlipidemia The 10-year ASCVD risk score Rachael West DC Jr., et al., 2013) is: 10.6%   Values used to calculate the score:     Age: 13 years     Sex: Female     Is Non-Hispanic African American: Yes     Diabetic: No     Tobacco smoker: No     Systolic Blood Pressure: 502 mmHg     Is BP treated: Yes     HDL Cholesterol: 37 mg/dL     Total Cholesterol: 216 mg/dL  LDL-154 Recommend TLC, recheck lipids in 6 months if not improved then will discuss starting statin therapy  Healthcare maintenance Continue all medications as directed. Increase water intake and follow Mediterranean diet. Physical Therapy referral placed to help improve strength and balance- increase regular movement/exercise as tolerated. Cholesterol is elevated and recommend trying to reduce to lifestyle- mediterranean diet and increasing regular exercise. Continue regular follow-up with various specialists. 6 month follow-up here, with fasting labs.  If cholesterol is not improved at that time, will discuss starting statin.  Gait disturbance PT referral placed  Depression Followed by psychiatry and weekly "group sessions" Continue Sertraline 200mg  QAM  Anxiety Followed by psychiatry Continue Buspirone 5mg  TID    FOLLOW-UP:  Return in about 6 months (around 05/29/2018) for Regular Follow Up, Fasting Labs.

## 2017-11-27 LAB — LIPID PANEL
CHOLESTEROL TOTAL: 216 mg/dL — AB (ref 100–199)
Chol/HDL Ratio: 5.8 ratio — ABNORMAL HIGH (ref 0.0–4.4)
HDL: 37 mg/dL — ABNORMAL LOW (ref >39)
LDL CALC: 154 mg/dL — AB (ref 0–99)
Triglycerides: 127 mg/dL (ref 0–149)
VLDL CHOLESTEROL CAL: 25 mg/dL (ref 5–40)

## 2017-11-28 ENCOUNTER — Encounter: Payer: Self-pay | Admitting: Adult Health

## 2017-11-28 ENCOUNTER — Ambulatory Visit (INDEPENDENT_AMBULATORY_CARE_PROVIDER_SITE_OTHER): Payer: Medicare Other | Admitting: Adult Health

## 2017-11-28 VITALS — BP 129/79 | HR 93 | Temp 98.7°F | Ht 65.75 in | Wt 197.4 lb

## 2017-11-28 DIAGNOSIS — B182 Chronic viral hepatitis C: Secondary | ICD-10-CM

## 2017-11-28 DIAGNOSIS — I10 Essential (primary) hypertension: Secondary | ICD-10-CM

## 2017-11-28 DIAGNOSIS — Z0000 Encounter for general adult medical examination without abnormal findings: Secondary | ICD-10-CM

## 2017-11-28 DIAGNOSIS — R269 Unspecified abnormalities of gait and mobility: Secondary | ICD-10-CM

## 2017-11-28 DIAGNOSIS — F419 Anxiety disorder, unspecified: Secondary | ICD-10-CM

## 2017-11-28 DIAGNOSIS — E785 Hyperlipidemia, unspecified: Secondary | ICD-10-CM

## 2017-11-28 DIAGNOSIS — F32A Depression, unspecified: Secondary | ICD-10-CM

## 2017-11-28 DIAGNOSIS — F329 Major depressive disorder, single episode, unspecified: Secondary | ICD-10-CM

## 2017-11-28 MED ORDER — ATENOLOL 25 MG PO TABS: 25.0000 mg | ORAL_TABLET | Freq: Every day | ORAL | 3 refills | Status: DC

## 2017-11-28 MED ORDER — LISINOPRIL 5 MG PO TABS: 5.0000 mg | ORAL_TABLET | Freq: Every day | ORAL | 3 refills | Status: DC

## 2017-11-28 NOTE — Patient Instructions (Addendum)
Preventive Care for Adults, Female  A healthy lifestyle and preventive care can promote health and wellness. Preventive health guidelines for women include the following key practices.   A routine yearly physical is a good way to check with your health care provider about your health and preventive screening. It is a chance to share any concerns and updates on your health and to receive a thorough exam.   Visit your dentist for a routine exam and preventive care every 6 months. Brush your teeth twice a day and floss once a day. Good oral hygiene prevents tooth decay and gum disease.   The frequency of eye exams is based on your age, health, family medical history, use of contact lenses, and other factors. Follow your health care provider's recommendations for frequency of eye exams.   Eat a healthy diet. Foods like vegetables, fruits, whole grains, low-fat dairy products, and lean protein foods contain the nutrients you need without too many calories. Decrease your intake of foods high in solid fats, added sugars, and salt. Eat the right amount of calories for you.Get information about a proper diet from your health care provider, if necessary.   Regular physical exercise is one of the most important things you can do for your health. Most adults should get at least 150 minutes of moderate-intensity exercise (any activity that increases your heart rate and causes you to sweat) each week. In addition, most adults need muscle-strengthening exercises on 2 or more days a week.   Maintain a healthy weight. The body mass index (BMI) is a screening tool to identify possible weight problems. It provides an estimate of body fat based on height and weight. Your health care provider can find your BMI, and can help you achieve or maintain a healthy weight.For adults 20 years and older:   - A BMI below 18.5 is considered underweight.   - A BMI of 18.5 to 24.9 is normal.   - A BMI of 25 to 29.9 is  considered overweight.   - A BMI of 30 and above is considered obese.   Maintain normal blood lipids and cholesterol levels by exercising and minimizing your intake of trans and saturated fats.  Eat a balanced diet with plenty of fruit and vegetables. Blood tests for lipids and cholesterol should begin at age 20 and be repeated every 5 years minimum.  If your lipid or cholesterol levels are high, you are over 40, or you are at high risk for heart disease, you may need your cholesterol levels checked more frequently.Ongoing high lipid and cholesterol levels should be treated with medicines if diet and exercise are not working.   If you smoke, find out from your health care provider how to quit. If you do not use tobacco, do not start.   Lung cancer screening is recommended for adults aged 55-80 years who are at high risk for developing lung cancer because of a history of smoking. A yearly low-dose CT scan of the lungs is recommended for people who have at least a 30-pack-year history of smoking and are a current smoker or have quit within the past 15 years. A pack year of smoking is smoking an average of 1 pack of cigarettes a day for 1 year (for example: 1 pack a day for 30 years or 2 packs a day for 15 years). Yearly screening should continue until the smoker has stopped smoking for at least 15 years. Yearly screening should be stopped for people who develop a   health problem that would prevent them from having lung cancer treatment.   If you are pregnant, do not drink alcohol. If you are breastfeeding, be very cautious about drinking alcohol. If you are not pregnant and choose to drink alcohol, do not have more than 1 drink per day. One drink is considered to be 12 ounces (355 mL) of beer, 5 ounces (148 mL) of wine, or 1.5 ounces (44 mL) of liquor.   Avoid use of street drugs. Do not share needles with anyone. Ask for help if you need support or instructions about stopping the use of  drugs.   High blood pressure causes heart disease and increases the risk of stroke. Your blood pressure should be checked at least yearly.  Ongoing high blood pressure should be treated with medicines if weight loss and exercise do not work.   If you are 69-55 years old, ask your health care provider if you should take aspirin to prevent strokes.   Diabetes screening involves taking a blood sample to check your fasting blood sugar level. This should be done once every 3 years, after age 38, if you are within normal weight and without risk factors for diabetes. Testing should be considered at a younger age or be carried out more frequently if you are overweight and have at least 1 risk factor for diabetes.   Breast cancer screening is essential preventive care for women. You should practice "breast self-awareness."  This means understanding the normal appearance and feel of your breasts and may include breast self-examination.  Any changes detected, no matter how small, should be reported to a health care provider.  Women in their 80s and 30s should have a clinical breast exam (CBE) by a health care provider as part of a regular health exam every 1 to 3 years.  After age 66, women should have a CBE every year.  Starting at age 1, women should consider having a mammogram (breast X-ray test) every year.  Women who have a family history of breast cancer should talk to their health care provider about genetic screening.  Women at a high risk of breast cancer should talk to their health care providers about having an MRI and a mammogram every year.   -Breast cancer gene (BRCA)-related cancer risk assessment is recommended for women who have family members with BRCA-related cancers. BRCA-related cancers include breast, ovarian, tubal, and peritoneal cancers. Having family members with these cancers may be associated with an increased risk for harmful changes (mutations) in the breast cancer genes BRCA1 and  BRCA2. Results of the assessment will determine the need for genetic counseling and BRCA1 and BRCA2 testing.   The Pap test is a screening test for cervical cancer. A Pap test can show cell changes on the cervix that might become cervical cancer if left untreated. A Pap test is a procedure in which cells are obtained and examined from the lower end of the uterus (cervix).   - Women should have a Pap test starting at age 57.   - Between ages 90 and 70, Pap tests should be repeated every 2 years.   - Beginning at age 63, you should have a Pap test every 3 years as long as the past 3 Pap tests have been normal.   - Some women have medical problems that increase the chance of getting cervical cancer. Talk to your health care provider about these problems. It is especially important to talk to your health care provider if a  new problem develops soon after your last Pap test. In these cases, your health care provider may recommend more frequent screening and Pap tests.   - The above recommendations are the same for women who have or have not gotten the vaccine for human papillomavirus (HPV).   - If you had a hysterectomy for a problem that was not cancer or a condition that could lead to cancer, then you no longer need Pap tests. Even if you no longer need a Pap test, a regular exam is a good idea to make sure no other problems are starting.   - If you are between ages 36 and 66 years, and you have had normal Pap tests going back 10 years, you no longer need Pap tests. Even if you no longer need a Pap test, a regular exam is a good idea to make sure no other problems are starting.   - If you have had past treatment for cervical cancer or a condition that could lead to cancer, you need Pap tests and screening for cancer for at least 20 years after your treatment.   - If Pap tests have been discontinued, risk factors (such as a new sexual partner) need to be reassessed to determine if screening should  be resumed.   - The HPV test is an additional test that may be used for cervical cancer screening. The HPV test looks for the virus that can cause the cell changes on the cervix. The cells collected during the Pap test can be tested for HPV. The HPV test could be used to screen women aged 70 years and older, and should be used in women of any age who have unclear Pap test results. After the age of 67, women should have HPV testing at the same frequency as a Pap test.   Colorectal cancer can be detected and often prevented. Most routine colorectal cancer screening begins at the age of 57 years and continues through age 26 years. However, your health care provider may recommend screening at an earlier age if you have risk factors for colon cancer. On a yearly basis, your health care provider may provide home test kits to check for hidden blood in the stool.  Use of a small camera at the end of a tube, to directly examine the colon (sigmoidoscopy or colonoscopy), can detect the earliest forms of colorectal cancer. Talk to your health care provider about this at age 23, when routine screening begins. Direct exam of the colon should be repeated every 5 -10 years through age 49 years, unless early forms of pre-cancerous polyps or small growths are found.   People who are at an increased risk for hepatitis B should be screened for this virus. You are considered at high risk for hepatitis B if:  -You were born in a country where hepatitis B occurs often. Talk with your health care provider about which countries are considered high risk.  - Your parents were born in a high-risk country and you have not received a shot to protect against hepatitis B (hepatitis B vaccine).  - You have HIV or AIDS.  - You use needles to inject street drugs.  - You live with, or have sex with, someone who has Hepatitis B.  - You get hemodialysis treatment.  - You take certain medicines for conditions like cancer, organ  transplantation, and autoimmune conditions.   Hepatitis C blood testing is recommended for all people born from [birth year] through 1965 and any individual  with known risks for hepatitis C.   Practice safe sex. Use condoms and avoid high-risk sexual practices to reduce the spread of sexually transmitted infections (STIs). STIs include gonorrhea, chlamydia, syphilis, trichomonas, herpes, HPV, and human immunodeficiency virus (HIV). Herpes, HIV, and HPV are viral illnesses that have no cure. They can result in disability, cancer, and death. Sexually active women aged 57 years and younger should be checked for chlamydia. Older women with new or multiple partners should also be tested for chlamydia. Testing for other STIs is recommended if you are sexually active and at increased risk.   Osteoporosis is a disease in which the bones lose minerals and strength with aging. This can result in serious bone fractures or breaks. The risk of osteoporosis can be identified using a bone density scan. Women ages 31 years and over and women at risk for fractures or osteoporosis should discuss screening with their health care providers. Ask your health care provider whether you should take a calcium supplement or vitamin D to There are also several preventive steps women can take to avoid osteoporosis and resulting fractures or to keep osteoporosis from worsening. -->Recommendations include:  Eat a balanced diet high in fruits, vegetables, calcium, and vitamins.  Get enough calcium. The recommended total intake of is 1,200 mg daily; for best absorption, if taking supplements, divide doses into 250-500 mg doses throughout the day. Of the two types of calcium, calcium carbonate is best absorbed when taken with food but calcium citrate can be taken on an empty stomach.  Get enough vitamin D. [employer] and the [employer] recommend at least 1,000 IU per day for women age 68 and over who are at risk of vitamin D  deficiency. Vitamin D deficiency can be caused by inadequate sun exposure (for example, those who live in [place]).  Avoid alcohol and smoking. Heavy alcohol intake (more than 7 drinks per week) increases the risk of falls and hip fracture and women smokers tend to lose bone more rapidly and have lower bone mass than nonsmokers. Stopping smoking is one of the most important changes women can make to improve their health and decrease risk for disease.  Be physically active every day. Weight-bearing exercise (for example, fast walking, hiking, jogging, and weight training) may strengthen bones or slow the rate of bone loss that comes with aging. Balancing and muscle-strengthening exercises can reduce the risk of falling and fracture.  Consider therapeutic medications. Currently, several types of effective drugs are available. Healthcare providers can recommend the type most appropriate for each woman.  Eliminate environmental factors that may contribute to accidents. Falls cause nearly 90% of all osteoporotic fractures, so reducing this risk is an important bone-health strategy. Measures include ample lighting, removing obstructions to walking, using nonskid rugs on floors, and placing mats and/or grab bars in showers.  Be aware of medication side effects. Some common medicines make bones weaker. These include a type of steroid drug called glucocorticoids used for arthritis and asthma, some antiseizure drugs, certain sleeping pills, treatments for endometriosis, and some cancer drugs. An overactive thyroid gland or using too much thyroid hormone for an underactive thyroid can also be a problem. If you are taking these medicines, talk to your doctor about what you can do to help protect your bones.reduce the rate of osteoporosis.    Menopause can be associated with physical symptoms and risks. Hormone replacement therapy is available to decrease symptoms and risks. You should talk to your  health care provider  about whether hormone replacement therapy is right for you.   Use sunscreen. Apply sunscreen liberally and repeatedly throughout the day. You should seek shade when your shadow is shorter than you. Protect yourself by wearing long sleeves, pants, a wide-brimmed hat, and sunglasses year round, whenever you are outdoors.   Once a month, do a whole body skin exam, using a mirror to look at the skin on your back. Tell your health care provider of new moles, moles that have irregular borders, moles that are larger than a pencil eraser, or moles that have changed in shape or color.   -Stay current with required vaccines (immunizations).   Influenza vaccine. All adults should be immunized every year.  Tetanus, diphtheria, and acellular pertussis (Td, Tdap) vaccine. Pregnant women should receive 1 dose of Tdap vaccine during each pregnancy. The dose should be obtained regardless of the length of time since the last dose. Immunization is preferred during the 27th 36th week of gestation. An adult who has not previously received Tdap or who does not know her vaccine status should receive 1 dose of Tdap. This initial dose should be followed by tetanus and diphtheria toxoids (Td) booster doses every 10 years. Adults with an unknown or incomplete history of completing a 3-dose immunization series with Td-containing vaccines should begin or complete a primary immunization series including a Tdap dose. Adults should receive a Td booster every 10 years.  Varicella vaccine. An adult without evidence of immunity to varicella should receive 2 doses or a second dose if she has previously received 1 dose. Pregnant females who do not have evidence of immunity should receive the first dose after pregnancy. This first dose should be obtained before leaving the health care facility. The second dose should be obtained 4 8 weeks after the first dose.  Human papillomavirus (HPV) vaccine. Females aged 97 26  years who have not received the vaccine previously should obtain the 3-dose series. The vaccine is not recommended for use in pregnant females. However, pregnancy testing is not needed before receiving a dose. If a female is found to be pregnant after receiving a dose, no treatment is needed. In that case, the remaining doses should be delayed until after the pregnancy. Immunization is recommended for any person with an immunocompromised condition through the age of 41 years if she did not get any or all doses earlier. During the 3-dose series, the second dose should be obtained 4 8 weeks after the first dose. The third dose should be obtained 24 weeks after the first dose and 16 weeks after the second dose.  Zoster vaccine. One dose is recommended for adults aged 52 years or older unless certain conditions are present.  Measles, mumps, and rubella (MMR) vaccine. Adults born before 10 generally are considered immune to measles and mumps. Adults born in 1 or later should have 1 or more doses of MMR vaccine unless there is a contraindication to the vaccine or there is laboratory evidence of immunity to each of the three diseases. A routine second dose of MMR vaccine should be obtained at least 28 days after the first dose for students attending postsecondary schools, health care workers, or international travelers. People who received inactivated measles vaccine or an unknown type of measles vaccine during 1963 1967 should receive 2 doses of MMR vaccine. People who received inactivated mumps vaccine or an unknown type of mumps vaccine before 1979 and are at high risk for mumps infection should consider immunization with 2 doses of  MMR vaccine. For females of childbearing age, rubella immunity should be determined. If there is no evidence of immunity, females who are not pregnant should be vaccinated. If there is no evidence of immunity, females who are pregnant should delay immunization until after pregnancy.  Unvaccinated health care workers born before [birth year] who lack laboratory evidence of measles, mumps, or rubella immunity or laboratory confirmation of disease should consider measles and mumps immunization with 2 doses of MMR vaccine or rubella immunization with 1 dose of MMR vaccine.  Pneumococcal 13-valent conjugate (PCV13) vaccine. When indicated, a person who is uncertain of her immunization history and has no record of immunization should receive the PCV13 vaccine. An adult aged 54 years or older who has certain medical conditions and has not been previously immunized should receive 1 dose of PCV13 vaccine. This PCV13 should be followed with a dose of pneumococcal polysaccharide (PPSV23) vaccine. The PPSV23 vaccine dose should be obtained at least 8 weeks after the dose of PCV13 vaccine. An adult aged 58 years or older who has certain medical conditions and previously received 1 or more doses of PPSV23 vaccine should receive 1 dose of PCV13. The PCV13 vaccine dose should be obtained 1 or more years after the last PPSV23 vaccine dose.  Pneumococcal polysaccharide (PPSV23) vaccine. When PCV13 is also indicated, PCV13 should be obtained first. All adults aged 58 years and older should be immunized. An adult younger than age 65 years who has certain medical conditions should be immunized. Any person who resides in a nursing home or long-term care facility should be immunized. An adult smoker should be immunized. People with an immunocompromised condition and certain other conditions should receive both PCV13 and PPSV23 vaccines. People with human immunodeficiency virus (HIV) infection should be immunized as soon as possible after diagnosis. Immunization during chemotherapy or radiation therapy should be avoided. Routine use of PPSV23 vaccine is not recommended for American Indians, [place] Natives, or people younger than 65 years unless there are medical conditions that require PPSV23 vaccine. When indicated,  people who have unknown immunization and have no record of immunization should receive PPSV23 vaccine. One-time revaccination 5 years after the first dose of PPSV23 is recommended for people aged 70 64 years who have chronic kidney failure, nephrotic syndrome, asplenia, or immunocompromised conditions. People who received 1 2 doses of PPSV23 before age 32 years should receive another dose of PPSV23 vaccine at age 96 years or later if at least 5 years have passed since the previous dose. Doses of PPSV23 are not needed for people immunized with PPSV23 at or after age 55 years.  Meningococcal vaccine. Adults with asplenia or persistent complement component deficiencies should receive 2 doses of quadrivalent meningococcal conjugate (MenACWY-D) vaccine. The doses should be obtained at least 2 months apart. Microbiologists working with certain meningococcal bacteria, [employer] recruits, people at risk during an outbreak, and people who travel to or live in countries with a high rate of meningitis should be immunized. A first-year college student up through age 58 years who is living in a residence hall should receive a dose if she did not receive a dose on or after her 16th birthday. Adults who have certain high-risk conditions should receive one or more doses of vaccine.  Hepatitis A vaccine. Adults who wish to be protected from this disease, have certain high-risk conditions, work with hepatitis A-infected animals, work in hepatitis A research labs, or travel to or work in countries with a high rate of hepatitis A should be  immunized. Adults who were previously unvaccinated and who anticipate close contact with an international adoptee during the first 60 days after arrival in the [place] from a country with a high rate of hepatitis A should be immunized.  Hepatitis B vaccine.  Adults who wish to be protected from this disease, have certain high-risk conditions, may be exposed to blood or other infectious  body fluids, are household contacts or sex partners of hepatitis B positive people, are clients or workers in certain care facilities, or travel to or work in countries with a high rate of hepatitis B should be immunized.  Haemophilus influenzae type b (Hib) vaccine. A previously unvaccinated person with asplenia or sickle cell disease or having a scheduled splenectomy should receive 1 dose of Hib vaccine. Regardless of previous immunization, a recipient of a hematopoietic stem cell transplant should receive a 3-dose series 6 12 months after her successful transplant. Hib vaccine is not recommended for adults with HIV infection.  Preventive Services / Frequency Ages 6 to 39years  Blood pressure check.** / Every 1 to 2 years.  Lipid and cholesterol check.** / Every 5 years beginning at age 39.  Clinical breast exam.** / Every 3 years for women in their 61s and 62s.  BRCA-related cancer risk assessment.** / For women who have family members with a BRCA-related cancer (breast, ovarian, tubal, or peritoneal cancers).  Pap test.** / Every 2 years from ages 47 through 85. Every 3 years starting at age 34 through age 12 or 74 with a history of 3 consecutive normal Pap tests.  HPV screening.** / Every 3 years from ages 46 through ages 43 to 54 with a history of 3 consecutive normal Pap tests.  Hepatitis C blood test.** / For any individual with known risks for hepatitis C.  Skin self-exam. / Monthly.  Influenza vaccine. / Every year.  Tetanus, diphtheria, and acellular pertussis (Tdap, Td) vaccine.** / Consult your health care provider. Pregnant women should receive 1 dose of Tdap vaccine during each pregnancy. 1 dose of Td every 10 years.  Varicella vaccine.** / Consult your health care provider. Pregnant females who do not have evidence of immunity should receive the first dose after pregnancy.  HPV vaccine. / 3 doses over 6 months, if 64 and younger. The vaccine is not recommended for use in  pregnant females. However, pregnancy testing is not needed before receiving a dose.  Measles, mumps, rubella (MMR) vaccine.** / You need at least 1 dose of MMR if you were born in 1957 or later. You may also need a 2nd dose. For females of childbearing age, rubella immunity should be determined. If there is no evidence of immunity, females who are not pregnant should be vaccinated. If there is no evidence of immunity, females who are pregnant should delay immunization until after pregnancy.  Pneumococcal 13-valent conjugate (PCV13) vaccine.** / Consult your health care provider.  Pneumococcal polysaccharide (PPSV23) vaccine.** / 1 to 2 doses if you smoke cigarettes or if you have certain conditions.  Meningococcal vaccine.** / 1 dose if you are age 71 to 37 years and a Market researcher living in a residence hall, or have one of several medical conditions, you need to get vaccinated against meningococcal disease. You may also need additional booster doses.  Hepatitis A vaccine.** / Consult your health care provider.  Hepatitis B vaccine.** / Consult your health care provider.  Haemophilus influenzae type b (Hib) vaccine.** / Consult your health care provider.  Ages 55 to 64years  Blood pressure check.** / Every 1 to 2 years.  Lipid and cholesterol check.** / Every 5 years beginning at age 20 years.  Lung cancer screening. / Every year if you are aged 55 80 years and have a 30-pack-year history of smoking and currently smoke or have quit within the past 15 years. Yearly screening is stopped once you have quit smoking for at least 15 years or develop a health problem that would prevent you from having lung cancer treatment.  Clinical breast exam.** / Every year after age 40 years.  BRCA-related cancer risk assessment.** / For women who have family members with a BRCA-related cancer (breast, ovarian, tubal, or peritoneal cancers).  Mammogram.** / Every year beginning at age 40  years and continuing for as long as you are in good health. Consult with your health care provider.  Pap test.** / Every 3 years starting at age 30 years through age 65 or 70 years with a history of 3 consecutive normal Pap tests.  HPV screening.** / Every 3 years from ages 30 years through ages 65 to 70 years with a history of 3 consecutive normal Pap tests.  Fecal occult blood test (FOBT) of stool. / Every year beginning at age 50 years and continuing until age 75 years. You may not need to do this test if you get a colonoscopy every 10 years.  Flexible sigmoidoscopy or colonoscopy.** / Every 5 years for a flexible sigmoidoscopy or every 10 years for a colonoscopy beginning at age 50 years and continuing until age 75 years.  Hepatitis C blood test.** / For all people born from 1945 through 1965 and any individual with known risks for hepatitis C.  Skin self-exam. / Monthly.  Influenza vaccine. / Every year.  Tetanus, diphtheria, and acellular pertussis (Tdap/Td) vaccine.** / Consult your health care provider. Pregnant women should receive 1 dose of Tdap vaccine during each pregnancy. 1 dose of Td every 10 years.  Varicella vaccine.** / Consult your health care provider. Pregnant females who do not have evidence of immunity should receive the first dose after pregnancy.  Zoster vaccine.** / 1 dose for adults aged 60 years or older.  Measles, mumps, rubella (MMR) vaccine.** / You need at least 1 dose of MMR if you were born in 1957 or later. You may also need a 2nd dose. For females of childbearing age, rubella immunity should be determined. If there is no evidence of immunity, females who are not pregnant should be vaccinated. If there is no evidence of immunity, females who are pregnant should delay immunization until after pregnancy.  Pneumococcal 13-valent conjugate (PCV13) vaccine.** / Consult your health care provider.  Pneumococcal polysaccharide (PPSV23) vaccine.** / 1 to 2 doses if  you smoke cigarettes or if you have certain conditions.  Meningococcal vaccine.** / Consult your health care provider.  Hepatitis A vaccine.** / Consult your health care provider.  Hepatitis B vaccine.** / Consult your health care provider.  Haemophilus influenzae type b (Hib) vaccine.** / Consult your health care provider.  Ages 65 years and over  Blood pressure check.** / Every 1 to 2 years.  Lipid and cholesterol check.** / Every 5 years beginning at age 20 years.  Lung cancer screening. / Every year if you are aged 55 80 years and have a 30-pack-year history of smoking and currently smoke or have quit within the past 15 years. Yearly screening is stopped once you have quit smoking for at least 15 years or develop a health problem that   would prevent you from having lung cancer treatment.  Clinical breast exam.** / Every year after age 103 years.  BRCA-related cancer risk assessment.** / For women who have family members with a BRCA-related cancer (breast, ovarian, tubal, or peritoneal cancers).  Mammogram.** / Every year beginning at age 36 years and continuing for as long as you are in good health. Consult with your health care provider.  Pap test.** / Every 3 years starting at age 5 years through age 85 or 10 years with 3 consecutive normal Pap tests. Testing can be stopped between 65 and 70 years with 3 consecutive normal Pap tests and no abnormal Pap or HPV tests in the past 10 years.  HPV screening.** / Every 3 years from ages 93 years through ages 70 or 45 years with a history of 3 consecutive normal Pap tests. Testing can be stopped between 65 and 70 years with 3 consecutive normal Pap tests and no abnormal Pap or HPV tests in the past 10 years.  Fecal occult blood test (FOBT) of stool. / Every year beginning at age 8 years and continuing until age 45 years. You may not need to do this test if you get a colonoscopy every 10 years.  Flexible sigmoidoscopy or colonoscopy.** /  Every 5 years for a flexible sigmoidoscopy or every 10 years for a colonoscopy beginning at age 69 years and continuing until age 68 years.  Hepatitis C blood test.** / For all people born from [birth year] through 1967 and any individual with known risks for hepatitis C.  Osteoporosis screening.** / A one-time screening for women ages 7 years and over and women at risk for fractures or osteoporosis.  Skin self-exam. / Monthly.  Influenza vaccine. / Every year.  Tetanus, diphtheria, and acellular pertussis (Tdap/Td) vaccine.** / 1 dose of Td every 10 years.  Varicella vaccine.** / Consult your health care provider.  Zoster vaccine.** / 1 dose for adults aged 5 years or older.  Pneumococcal 13-valent conjugate (PCV13) vaccine.** / Consult your health care provider.  Pneumococcal polysaccharide (PPSV23) vaccine.** / 1 dose for all adults aged 74 years and older.  Meningococcal vaccine.** / Consult your health care provider.  Hepatitis A vaccine.** / Consult your health care provider.  Hepatitis B vaccine.** / Consult your health care provider.  Haemophilus influenzae type b (Hib) vaccine.** / Consult your health care provider. ** Family history and personal history of risk and conditions may change your health care provider's recommendations. Document Released: 02/21/2001 Document Revised: 10/16/2012  [facility] Patient Information 2014 [place], [place].   EXERCISE AND DIET:  We recommended that you start or continue a regular exercise program for good health. Regular exercise means any activity that makes your heart beat faster and makes you sweat.  We recommend exercising at least 30 minutes per day at least 3 days a week, preferably 5.  We also recommend a diet low in fat and sugar / carbohydrates.  Inactivity, poor dietary choices and obesity can cause diabetes, heart attack, stroke, and kidney damage, among others.     ALCOHOL AND SMOKING:  Women should limit their alcohol intake to no  more than 7 drinks/beers/glasses of wine (combined, not each!) per week. Moderation of alcohol intake to this level decreases your risk of breast cancer and liver damage.  ( And of course, no recreational drugs are part of a healthy lifestyle.)  Also, you should not be smoking at all or even being exposed to second hand smoke. Most people know smoking can  cause cancer, and various heart and lung diseases, but did you know it also contributes to weakening of your bones?  Aging of your skin?  Yellowing of your teeth and nails?   CALCIUM AND VITAMIN D:  Adequate intake of calcium and Vitamin D are recommended.  The recommendations for exact amounts of these supplements seem to change often, but generally speaking 600 mg of calcium (either carbonate or citrate) and 800 units of Vitamin D per day seems prudent. Certain women may benefit from higher intake of Vitamin D.  If you are among these women, your doctor will have told you during your visit.     PAP SMEARS:  Pap smears, to check for cervical cancer or precancers,  have traditionally been done yearly, although recent scientific advances have shown that most women can have pap smears less often.  However, every woman still should have a physical exam from her gynecologist or primary care physician every year. It will include a breast check, inspection of the vulva and vagina to check for abnormal growths or skin changes, a visual exam of the cervix, and then an exam to evaluate the size and shape of the uterus and ovaries.  And after 64 years of age, a rectal exam is indicated to check for rectal cancers. We will also provide age appropriate advice regarding health maintenance, like when you should have certain vaccines, screening for sexually transmitted diseases, bone density testing, colonoscopy, mammograms, etc.    MAMMOGRAMS:  All women over 28 years old should have a yearly mammogram. Many facilities now offer a "3D" mammogram, which may cost  around $50 extra out of pocket. If possible,  we recommend you accept the option to have the 3D mammogram performed.  It both reduces the number of women who will be called back for extra views which then turn out to be normal, and it is better than the routine mammogram at detecting truly abnormal areas.     COLONOSCOPY:  Colonoscopy to screen for colon cancer is recommended for all women at age 63.  We know, you hate the idea of the prep.  We agree, BUT, having colon cancer and not knowing it is worse!!  Colon cancer so often starts as a polyp that can be seen and removed at colonscopy, which can quite literally save your life!  And if your first colonoscopy is normal and you have no family history of colon cancer, most women don't have to have it again for 10 years.  Once every ten years, you can do something that may end up saving your life, right?  We will be happy to help you get it scheduled when you are ready.  Be sure to check your insurance coverage so you understand how much it will cost.  It may be covered as a preventative service at no cost, but you should check your particular policy.        Mediterranean Diet  Why follow it? Research shows. . Those who follow the Mediterranean diet have a reduced risk of heart disease  . The diet is associated with a reduced incidence of Parkinson's and Alzheimer's diseases . People following the diet may have longer life expectancies and lower rates of chronic diseases  . The Dietary Guidelines for Americans recommends the Mediterranean diet as an eating plan to promote health and prevent disease  What Is the Mediterranean Diet?  . Healthy eating plan based on typical foods and recipes of Mediterranean-style cooking . The diet  is primarily a plant based diet; these foods should make up a majority of meals   Starches - Plant based foods should make up a majority of meals - They are an important sources of vitamins, minerals, energy, antioxidants,  and fiber - Choose whole grains, foods high in fiber and minimally processed items  - Typical grain sources include wheat, oats, barley, corn, brown rice, bulgar, farro, millet, polenta, couscous  - Various types of beans include chickpeas, lentils, fava beans, black beans, white beans   Fruits  Veggies - Large quantities of antioxidant rich fruits & veggies; 6 or more servings  - Vegetables can be eaten raw or lightly drizzled with oil and cooked  - Vegetables common to the traditional Mediterranean Diet include: artichokes, arugula, beets, broccoli, brussel sprouts, cabbage, carrots, celery, collard greens, cucumbers, eggplant, kale, leeks, lemons, lettuce, mushrooms, okra, onions, peas, peppers, potatoes, pumpkin, radishes, rutabaga, shallots, spinach, sweet potatoes, turnips, zucchini - Fruits common to the Mediterranean Diet include: apples, apricots, avocados, cherries, clementines, dates, figs, grapefruits, grapes, melons, nectarines, oranges, peaches, pears, pomegranates, strawberries, tangerines  Fats - Replace butter and margarine with healthy oils, such as olive oil, canola oil, and tahini  - Limit nuts to no more than a handful a day  - Nuts include walnuts, almonds, pecans, pistachios, pine nuts  - Limit or avoid candied, honey roasted or heavily salted nuts - Olives are central to the [employer] - can be eaten whole or used in a variety of dishes   Meats Protein - Limiting red meat: no more than a few times a month - When eating red meat: choose lean cuts and keep the portion to the size of deck of cards - Eggs: approx. 0 to 4 times a week  - Fish and lean poultry: at least 2 a week  - Healthy protein sources include, chicken, [place], lean beef, lamb - Increase intake of seafood such as tuna, salmon, trout, mackerel, shrimp, scallops - Avoid or limit high fat processed meats such as sausage and bacon  Dairy - Include moderate amounts of low fat dairy products  - Focus on  healthy dairy such as fat free yogurt, skim milk, low or reduced fat cheese - Limit dairy products higher in fat such as whole or 2% milk, cheese, ice cream  Alcohol - Moderate amounts of red wine is ok  - No more than 5 oz daily for women (all ages) and men older than age 55  - No more than 10 oz of wine daily for men younger than 45  Other - Limit sweets and other desserts  - Use herbs and spices instead of salt to flavor foods  - Herbs and spices common to the traditional Mediterranean Diet include: basil, bay leaves, chives, cloves, cumin, fennel, garlic, lavender, marjoram, mint, oregano, parsley, pepper, rosemary, sage, savory, sumac, tarragon, thyme   It's not just a diet, it's a lifestyle:  . The Mediterranean diet includes lifestyle factors typical of those in the region  . Foods, drinks and meals are best eaten with others and savored . Daily physical activity is important for overall good health . This could be strenuous exercise like running and aerobics . This could also be more leisurely activities such as walking, housework, yard-work, or taking the stairs . Moderation is the key; a balanced and healthy diet accommodates most foods and drinks . Consider portion sizes and frequency of consumption of certain foods   Meal Ideas & Options:  . Breakfast:  o Whole wheat toast or whole wheat English muffins with peanut butter & hard boiled egg o Steel cut oats topped with apples & cinnamon and skim milk  o Fresh fruit: banana, strawberries, melon, berries, peaches  o Smoothies: strawberries, bananas, greek yogurt, peanut butter o Low fat greek yogurt with blueberries and granola  o Egg white omelet with spinach and mushrooms o Breakfast couscous: whole wheat couscous, apricots, skim milk, cranberries  . Sandwiches:  o Hummus and grilled vegetables (peppers, zucchini, squash) on whole wheat bread   o Grilled chicken on whole wheat pita with lettuce, tomatoes, cucumbers or tzatziki   o Tuna salad on whole wheat bread: tuna salad made with greek yogurt, olives, red peppers, capers, green onions o Garlic rosemary lamb pita: lamb sauted with garlic, rosemary, salt & pepper; add lettuce, cucumber, greek yogurt to pita - flavor with lemon juice and black pepper  . Seafood:  o Mediterranean grilled salmon, seasoned with garlic, basil, parsley, lemon juice and black pepper o Shrimp, lemon, and spinach whole-grain pasta salad made with low fat greek yogurt  o Seared scallops with lemon orzo  o Seared tuna steaks seasoned salt, pepper, coriander topped with tomato mixture of olives, tomatoes, olive oil, minced garlic, parsley, green onions and cappers  . Meats:  o Herbed greek chicken salad with kalamata olives, cucumber, feta  o Red bell peppers stuffed with spinach, bulgur, lean ground beef (or lentils) & topped with feta   o Kebabs: skewers of chicken, tomatoes, onions, zucchini, squash  o [place] burgers: made with red onions, mint, dill, lemon juice, feta cheese topped with roasted red peppers . Vegetarian o Cucumber salad: cucumbers, artichoke hearts, celery, red onion, feta cheese, tossed in olive oil & lemon juice  o Hummus and whole grain pita points with a greek salad (lettuce, tomato, feta, olives, cucumbers, red onion) o Lentil soup with celery, carrots made with vegetable broth, garlic, salt and pepper  o Tabouli salad: parsley, bulgur, mint, scallions, cucumbers, tomato, radishes, lemon juice, olive oil, salt and pepper.  Continue all medications as directed. Increase water intake and follow Mediterranean diet. Physical Therapy referral placed to help improve strength and balance- increase regular movement/exercise as tolerated. Cholesterol is elevated and recommend trying to reduce to lifestyle- mediterranean diet and increasing regular exercise. Continue regular follow-up with various specialists. 6 month follow-up here, with fasting labs.  If cholesterol is not  improved at that time, will discuss starting statin. NICE TO SEE YOU!

## 2017-11-28 NOTE — Assessment & Plan Note (Signed)
Followed by psychiatry Continue Buspirone 5mg  TID

## 2017-11-28 NOTE — Assessment & Plan Note (Signed)
Continue all medications as directed. Increase water intake and follow Mediterranean diet. Physical Therapy referral placed to help improve strength and balance- increase regular movement/exercise as tolerated. Cholesterol is elevated and recommend trying to reduce to lifestyle- mediterranean diet and increasing regular exercise. Continue regular follow-up with various specialists. 6 month follow-up here, with fasting labs.  If cholesterol is not improved at that time, will discuss starting statin.

## 2017-11-28 NOTE — Assessment & Plan Note (Signed)
PT referral placed.

## 2017-11-28 NOTE — Assessment & Plan Note (Signed)
The 10-year ASCVD risk score Mikey Bussing DC Brooke Bonito., et al., 2013) is: 10.6%   Values used to calculate the score:     Age: 62 years     Sex: Female     Is Non-Hispanic African American: Yes     Diabetic: No     Tobacco smoker: No     Systolic Blood Pressure: 825 mmHg     Is BP treated: Yes     HDL Cholesterol: 37 mg/dL     Total Cholesterol: 216 mg/dL  [id number] Recommend TLC, recheck lipids in 6 months if not improved then will discuss starting statin therapy

## 2017-11-28 NOTE — Assessment & Plan Note (Signed)
Followed by psychiatry and weekly "group sessions" Continue Sertraline 200mg  QAM

## 2017-11-28 NOTE — Assessment & Plan Note (Addendum)
BP at goal 129/79, HR 93 Continue Lisinopril 5mg  QD, Atenolol 25mg  QD Denies acute cardiac sx's

## 2017-11-30 NOTE — Progress Notes (Signed)
Schedule Jan 29 at 9:30 w/ Dionisio David

## 2017-12-13 ENCOUNTER — Other Ambulatory Visit: Payer: Self-pay

## 2017-12-13 ENCOUNTER — Ambulatory Visit: Payer: Medicare Other | Attending: Adult Health

## 2017-12-13 DIAGNOSIS — R2681 Unsteadiness on feet: Secondary | ICD-10-CM | POA: Diagnosis present

## 2017-12-13 DIAGNOSIS — M6281 Muscle weakness (generalized): Secondary | ICD-10-CM | POA: Insufficient documentation

## 2017-12-13 DIAGNOSIS — R2689 Other abnormalities of gait and mobility: Secondary | ICD-10-CM | POA: Insufficient documentation

## 2017-12-13 NOTE — Therapy (Signed)
[facility] [address] [place], [place], [address] Phone: [phone]   Fax:  [phone]  Physical Therapy Evaluation  Patient Details  Name: Rachael West MRN: [id number] Date of Birth: [birth year] Referring Provider (PT): Mina Marble, NP   Encounter Date: 12/13/2017  PT End of Session - 12/13/17 1600    Visit Number  1    Number of Visits  9    Date for PT Re-Evaluation  01/12/18    Authorization Type  [employer] Medicare    PT Start Time  1455    PT Stop Time  1535    PT Time Calculation (min)  40 min    Equipment Utilized During Treatment  --   min guard to S prn   Activity Tolerance  Patient tolerated treatment well    Behavior During Therapy  [facility] for tasks assessed/performed       Past Medical History:  Diagnosis Date  . Anemia   . Anxiety   . Arthritis   . Asthma   . Depression   . GERD (gastroesophageal reflux disease)   . Hepatitis-C   . History of chemotherapy   . Hypertension   . Insomnia   . Lymphoma ([place]) 12/2013   aggressive lymphoma  . Personal history of non-Hodgkin lymphomas   . Vitamin D deficiency     Past Surgical History:  Procedure Laterality Date  . ABDOMINAL HYSTERECTOMY    . APPENDECTOMY    . BREAST BIOPSY Left 2015   lymphoma  . COLONOSCOPY  01/17/2011   Procedure: COLONOSCOPY;  Surgeon: Winfield Cunas., MD;  Location: [facility];  Service: Endoscopy;  Laterality: N/A;  . ESOPHAGOGASTRODUODENOSCOPY  01/17/2011   Procedure: ESOPHAGOGASTRODUODENOSCOPY (EGD);  Surgeon: Winfield Cunas., MD;  Location: [facility];  Service: Endoscopy;  Laterality: N/A;  . TRIGGER FINGER RELEASE      There were no vitals filed for this visit.   Subjective Assessment - 12/13/17 1506    Subjective  Pt reported balance and gait issues began a few months ago, she noticed that she would fall getting OOB. Pt denied dizziness (has dizziness if she wear hearing aides), more unsteady.  She's also fallen off chairs, when reaching overhead. Pt denied fractures or hitting head. Pt has received OPPT for neck and back pain in the past. Pt reported three falls in the last six months. Pt now uses a walking stick to amb. outside the house but does not use it in the house. Pt reports R knee occasionally "buckles". Pt stated L ankle has been sprained twice, with hairline fracture (healed). Pt uses walking stick more for LBP and R knee buckling.     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     Currently in Pain?  Yes    Pain Score  --   3-4/10   Pain Location  Back    Pain Orientation  Mid    Pain Descriptors / Indicators  Throbbing    Pain Type  Chronic pain    Pain Radiating Towards  L hip    Pain Onset  More than a month ago    Pain Frequency  Constant    Aggravating Factors   standing prolonged times (~5 minutes) and bending forward    Pain  Relieving Factors  sitting, medication         [facility] PT Assessment - 12/13/17 1517      Assessment   Medical Diagnosis  Gait disturbance    Referring Provider (PT)  Mina Marble, NP    Onset Date/Surgical Date  11/28/17   referral date   Hand Dominance  Right    Prior Therapy  OPPT for back and neck pain      Precautions   Precautions  Fall      Restrictions   Weight Bearing Restrictions  No      Balance Screen   Has the patient fallen in the past 6 months  Yes    How many times?  3    Has the patient had a decrease in activity level because of a fear of falling?   Yes    Is the patient reluctant to leave their home because of a fear of falling?   Yes      [place]  Private residence    Living Arrangements  Alone    Available Help at Discharge  Available  PRN/intermittently;Family    Type of Home  Apartment    Home Access  Stairs to enter    Entrance Stairs-Number of Steps  1 flight (10-15 steps)    Entrance Stairs-Rails  Left    Home Layout  One level    Home Equipment  Other (comment)   walking stick, and tripod cane     Prior Function   Level of Independence  Independent    Vocation  On disability    Leisure  Play games, puzzles, read, watch TV, play with nephew      Cognition   Overall Cognitive Status  Within Functional Limits for tasks assessed   with pt reporting some short-term memory deficits     Sensation   Light Touch  Appears Intact    Additional Comments  Intermittent BLE and BUE N/T, R neck N/T      Tone   Assessment Location  --   not formally tested     ROM / Strength   AROM / PROM / Strength  AROM;Strength      AROM   Overall AROM   Within functional limits for tasks performed    Overall AROM Comments  BUE/LE WNL      Strength   Overall Strength  Deficits    Overall Strength Comments  BUE grossly WNL. BLE: grossly 4/5 except 3+/5 hip abd in seated position. Hip ext not formally assessed but weakness suspected 2/2 gait deviations.       Transfers   Transfers  Sit to Stand;Stand to Sit    Sit to Stand  5: Supervision;With upper extremity assist;From chair/3-in-1    Stand to Sit  5: Supervision;With upper extremity assist;To chair/3-in-1      Ambulation/Gait   Ambulation/Gait  Yes    Ambulation/Gait Assistance  5: Supervision    Ambulation/Gait Assistance Details  S to ensure safety. Pt with intermittent use of walking stick.     Ambulation Distance (Feet)  200 Feet    Assistive device  --   walking stick and none   Gait Pattern  Step-through pattern;Decreased arm swing - right;Decreased arm swing - left;Decreased stride length;Decreased dorsiflexion - right;Decreased dorsiflexion - left;Trunk flexed   intermittent decr. heel strike with out walking stick   Ambulation Surface  Level;Indoor    Gait  velocity  2.11ft/sec with walking stick  Standardized Balance Assessment   Standardized Balance Assessment  Timed Up and Go Test;Dynamic Gait Index      Dynamic Gait Index   Level Surface  Mild Impairment    Change in Gait Speed  Moderate Impairment    Gait with Horizontal Head Turns  Mild Impairment    Gait with Vertical Head Turns  Mild Impairment    Gait and Pivot Turn  Mild Impairment    Step Over Obstacle  Mild Impairment    Step Around Obstacles  Mild Impairment    Steps  Moderate Impairment    Total Score  14      Timed Up and Go Test   TUG  Normal TUG    Normal TUG (seconds)  13.22   with walking stick               Objective measurements completed on examination: See above findings.              PT Education - 12/13/17 1600    Education Details  PT discussed outcome measures, PT POC, frequency, and duration.     Person(s) Educated  Patient    Methods  Explanation    Comprehension  Verbalized understanding       PT Short Term Goals - 12/13/17 1609      PT SHORT TERM GOAL #1   Title  same as LTGs        PT Long Term Goals - 12/13/17 1609      PT LONG TERM GOAL #1   Title  Pt will be IND in HEP to improve the deficits listed above. TARGET DATE FOR ALL LTGS: 01/10/18    Status  New      PT LONG TERM GOAL #2   Title  Pt will improve DGI score to >/=20/24 to decr. falls risk.     Status  New      PT LONG TERM GOAL #3   Title  Discuss aquatic PT and write goal as indicated.     Status  New      PT LONG TERM GOAL #4   Title  Pt will amb. 600' over even/uneven terrain, IND, without LOB to improve functional mobility.     Status  New      PT LONG TERM GOAL #5   Title  Pt will traverse 15 steps with one handrail at MOD I level to safely traverse apartment stairs.     Status  New             Plan - 12/13/17 1602    Clinical Impression Statement  Pt is a pleasant 62y/o female presenting to OPPT neuro for gait imbalance. 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 Pt's gait speed and TUG times were WNL. However, pt's DGI score indicates pt is at a high risk for falls. The following deficits were noted upon exam: gait deviations, impaired balance, decr. strength, postural dysfunction, and impaired flexibility. Pt would benefit from skilled PT to improve safety during functional mobility.     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     PT Frequency  2x / week    PT Duration  4 weeks    PT Treatment/Interventions  ADLs/Self Care Home Management;Aquatic Therapy;Biofeedback;Therapeutic exercise;Therapeutic activities;Manual techniques;Vestibular;Functional mobility training;Orthotic Fit/Training;Stair training;Gait training;DME Instruction;Patient/family education;Neuromuscular re-education;Balance training    PT Next Visit Plan  Ask about aquatic PT, initiate HEP for balance, posture, strength, and flexibility    Consulted and Agree with Plan of Care  Patient       Patient will benefit from skilled therapeutic intervention in order to improve the following deficits and impairments:  Abnormal gait, Decreased endurance, Decreased knowledge of use of DME, Decreased strength, Decreased balance, Pain, Impaired flexibility, Postural dysfunction(PT will not directly  address pain but will monitor closely)  Visit Diagnosis: Other abnormalities of gait and mobility - Plan: PT plan of care cert/re-cert  Muscle weakness (generalized) - Plan: PT plan of care cert/re-cert  Unsteadiness on feet - Plan: PT plan of care cert/re-cert     Problem List Patient Active Problem List   Diagnosis Date Noted  . Gait disturbance 11/28/2017  . Cervical facet syndrome (Bilateral) (R>L) 10/24/2017  . Spondylosis without myelopathy or radiculopathy, cervical region 10/24/2017  . Latex precautions, history of latex allergy 10/09/2017  . Elevated uric acid in blood 09/27/2017  . Chronic idiopathic gout 09/27/2017  . DDD (degenerative disc disease), cervical 09/27/2017  . 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 Chronic shoulder pain (Bilateral) (R>L) 09/26/2017  . Chronic hip pain (Bilateral) (L>R) 09/26/2017  . Cervicalgia 09/26/2017  . DDD (degenerative disc disease), lumbar 09/25/2017  . Lumbar facet arthropathy 09/25/2017  . Lumbar facet syndrome (Bilateral) (L>R) 09/25/2017  . Spondylosis without myelopathy or radiculopathy, lumbar region 09/25/2017  . DDD (degenerative disc disease), thoracic 09/25/2017  . Thoracolumbar dextroscoliosis 09/25/2017  . Vitamin D deficiency 09/25/2017  . Cervicogenic headache 09/25/2017  . Other dysphagia 09/24/2017  . Tendinitis of left rotator cuff 09/24/2017  . Tendinitis of right rotator cuff 09/24/2017  . Fibromyalgia 09/24/2017  . Elevated C-reactive protein (CRP) 09/03/2017  . Elevated sed rate 09/03/2017  . Chronic low back pain (Primary Area of Pain) (Bilateral) w/ sciatica (Left) 08/29/2017  . Chronic lower extremity pain (Secondary Area of Pain) (Left) 08/29/2017  . Chronic upper back pain [facility] Area of Pain) (Bilateral) (L>R) 08/29/2017  . Chronic neck pain (Fourth Area of Pain) (Bilateral) (L>R) 08/29/2017  . Occipital headache 08/29/2017  . Chronic knee pain (Bilateral) (R>L)  08/29/2017  . Chronic pain syndrome 08/29/2017  . Pharmacologic therapy 08/29/2017  . Long term current use of opiate analgesic 08/29/2017  . Disorder of skeletal system 08/29/2017  . Problems influencing health status 08/29/2017  . Chronic sacroiliac joint pain 08/29/2017  . Hyperlipidemia 07/31/2017  . GERD (gastroesophageal reflux disease) 05/01/2017  . Anxiety 05/01/2017  . Depression 05/01/2017  . Healthcare maintenance 05/01/2017  . Chronic idiopathic constipation 05/01/2017  . Dyspnea 10/22/2014  . Obesity (BMI 30-39.9) 10/22/2014  . Obesity 09/12/2014  . Essential hypertension 09/12/2014  . Pneumonia, lobar ([place]) 09/11/2014  . Intermittent asthma 09/11/2014  . Cirrhosis ([place]) 09/08/2014  . Chronic hepatitis C without hepatic coma ([place]) 01/15/2014  . 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 Constipation 04/24/2012  . Anemia 05/25/2011  . Atypical chest  pain 05/25/2011  . History of thyroid disease 05/25/2011  . Hepatitis C 05/25/2011  . Benign hypertension 05/25/2011  . Gastritis 05/25/2011  . Asthma 05/25/2011  . Stress incontinence 02/10/2011  . Urge incontinence 02/10/2011  . Osteoarthrosis involving multiple sites 10/17/2010    Reiko Vinje L 12/13/2017, 4:14 PM  [facility] [address] [place], [place], [address] Phone: [phone]   Fax:  [phone]  Name: Rachael West MRN: [id number] Date of Birth: 31-Aug-1955   Geoffry Paradise, PT,DPT 12/13/17 4:14 PM Phone: [phone] Fax: [phone]

## 2017-12-20 ENCOUNTER — Ambulatory Visit: Payer: Medicare Other

## 2017-12-20 DIAGNOSIS — M6281 Muscle weakness (generalized): Secondary | ICD-10-CM

## 2017-12-20 DIAGNOSIS — R2689 Other abnormalities of gait and mobility: Secondary | ICD-10-CM | POA: Diagnosis not present

## 2017-12-20 NOTE — Therapy (Signed)
[facility] [address] [place], [place], [address] Phone: [phone]   Fax:  [phone]  Physical Therapy Treatment  Patient Details  Name: Rachael West MRN: [id number] Date of Birth: 18-Aug-1955 Referring Provider (PT): Mina Marble, NP   Encounter Date: 12/20/2017  PT End of Session - 12/20/17 1141    Visit Number  2    Number of Visits  9    Date for PT Re-Evaluation  01/12/18    Authorization Type  [employer]HC Medicare    PT Start Time  1106    PT Stop Time  1144    PT Time Calculation (min)  38 min    Activity Tolerance  Patient tolerated treatment well    Behavior During Therapy  [facility] for tasks assessed/performed       Past Medical History:  Diagnosis Date  . Anemia   . Anxiety   . Arthritis   . Asthma   . Depression   . GERD (gastroesophageal reflux disease)   . Hepatitis-C   . History of chemotherapy   . Hypertension   . Insomnia   . Lymphoma ([place]) 12/2013   aggressive lymphoma  . Personal history of non-Hodgkin lymphomas   . Vitamin D deficiency     Past Surgical History:  Procedure Laterality Date  . ABDOMINAL HYSTERECTOMY    . APPENDECTOMY    . BREAST BIOPSY Left 2015   lymphoma  . COLONOSCOPY  01/17/2011   Procedure: COLONOSCOPY;  Surgeon: Winfield Cunas., MD;  Location: [facility];  Service: Endoscopy;  Laterality: N/A;  . ESOPHAGOGASTRODUODENOSCOPY  01/17/2011   Procedure: ESOPHAGOGASTRODUODENOSCOPY (EGD);  Surgeon: Winfield Cunas., MD;  Location: [facility];  Service: Endoscopy;  Laterality: N/A;  . TRIGGER FINGER RELEASE      There were no vitals filed for this visit.  Subjective Assessment - 12/20/17 1108    Subjective  Pt reported she stumbled in driveway but did not fall, she was amb. without walking stick. Pt also had a near fall in the bedroom but caught herself on the bed. Pt did not take pain meds prior to this session and stated she will take afterwards.     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     Currently in Pain?  Yes    Pain Score  5     Pain Location  Back    Pain Orientation  Lower    Pain Descriptors / Indicators  Aching;Dull    Pain Type  Chronic pain    Pain Onset  More than a month ago    Pain Frequency  Constant    Aggravating Factors   walking    Pain Relieving Factors  pain medication          Therex: Access Code: [email or web address]  URL: [email or web address]  Date: 12/20/2017  Prepared by: Geoffry Paradise   Exercises  Supine Lower Trunk Rotation - 15 reps - 1 sets - 1x daily - 7x weekly and then 3 reps with 30 sec. Hold on each side.  Beginner Bridge - 10 reps - 3 sets - 1x daily - 3x weekly  Clamshell - 10 reps - 2 sets - 1x daily - 3x weekly  Supine Figure 4 Piriformis Stretch - 3 reps - 1 sets -  30 hold - 1x daily - 7x weekly   Cues and demo for technique. Pt denied incr. In pain during session.                      PT Education - 12/20/17 1140    Education Details  PT provided pt with strengthening and flexibility HEP.     Person(s) Educated  Patient    Methods  Explanation;Demonstration;Verbal cues;Handout    Comprehension  Returned demonstration;Verbalized understanding       PT Short Term Goals - 12/13/17 1609      PT SHORT TERM GOAL #1   Title  same as LTGs        PT Long Term Goals - 12/13/17 1609      PT LONG TERM GOAL #1   Title  Pt will be IND in HEP to improve the deficits listed above. TARGET DATE FOR ALL LTGS: 01/10/18    Status  New      PT LONG TERM GOAL #2   Title  Pt will improve DGI score to >/=20/24 to decr. falls risk.     Status  New      PT LONG TERM GOAL #3   Title  Discuss aquatic PT and write goal as indicated.      Status  New      PT LONG TERM GOAL #4   Title  Pt will amb. 600' over even/uneven terrain, IND, without LOB to improve functional mobility.     Status  New      PT LONG TERM GOAL #5   Title  Pt will traverse 15 steps with one handrail at MOD I level to safely traverse apartment stairs.     Status  New            Plan - 12/20/17 1142    Clinical Impression Statement  Today's skilled session focused on establishing an HEP to improve hip strength and hip/back flexibility. Pt required cues and demo for technique but was able to perform without incr. in pain. PT will check with aquatic PT in order to schedule aquatic therapy for pt to improve strength, balance, and reduce pain-which limits pt's ability to perform activity. Pt would continue to benefit from skilled PT to improve safety during functional mobility.     Rehab Potential  Good    Clinical Impairments Affecting Rehab Potential  see above.     PT Frequency  2x / week    PT Duration  4 weeks    PT Treatment/Interventions  ADLs/Self Care Home Management;Aquatic Therapy;Biofeedback;Therapeutic exercise;Therapeutic activities;Manual techniques;Vestibular;Functional mobility training;Orthotic Fit/Training;Stair training;Gait training;DME Instruction;Patient/family education;Neuromuscular re-education;Balance training    PT Next Visit Plan  initiate HEP for balance, posture. Write aquatic goal once aquatic PT can schedule pt.     Consulted and Agree with Plan of Care  Patient       Patient will benefit from skilled therapeutic intervention in order to improve the following deficits and impairments:  Abnormal gait, Decreased endurance, Decreased knowledge of use of DME, Decreased strength, Decreased balance, Pain, Impaired flexibility, Postural dysfunction(PT will not directly address pain but will monitor closely)  Visit Diagnosis: Muscle weakness (generalized)  Other abnormalities of gait and mobility     Problem List Patient  Active Problem List   Diagnosis Date Noted  . Gait disturbance 11/28/2017  . Cervical facet syndrome (Bilateral) (R>L) 10/24/2017  . Spondylosis without myelopathy or radiculopathy, cervical region 10/24/2017  . Latex precautions, history of  latex allergy 10/09/2017  . Elevated uric acid in blood 09/27/2017  . Chronic idiopathic gout 09/27/2017  . DDD (degenerative disc disease), cervical 09/27/2017  . 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 Chronic shoulder pain (Bilateral) (R>L) 09/26/2017  . Chronic hip pain (Bilateral) (L>R) 09/26/2017  . Cervicalgia 09/26/2017  . DDD (degenerative disc disease), lumbar 09/25/2017  . Lumbar facet arthropathy 09/25/2017  . Lumbar facet syndrome (Bilateral) (L>R) 09/25/2017  . Spondylosis without myelopathy or radiculopathy, lumbar region 09/25/2017  . DDD (degenerative disc disease), thoracic 09/25/2017  . Thoracolumbar dextroscoliosis 09/25/2017  . Vitamin D deficiency 09/25/2017  . Cervicogenic headache 09/25/2017  . Other dysphagia 09/24/2017  . Tendinitis of left rotator cuff 09/24/2017  . Tendinitis of right rotator cuff 09/24/2017  . Fibromyalgia 09/24/2017  . Elevated C-reactive protein (CRP) 09/03/2017  . Elevated sed rate 09/03/2017  . Chronic low back pain (Primary Area of Pain) (Bilateral) w/ sciatica (Left) 08/29/2017  . Chronic lower extremity pain (Secondary Area of Pain) (Left) 08/29/2017  . Chronic upper back pain [facility] Area of Pain) (Bilateral) (L>R) 08/29/2017  . Chronic neck pain (Fourth Area of Pain) (Bilateral) (L>R) 08/29/2017  . Occipital headache 08/29/2017  . Chronic knee pain (Bilateral) (R>L) 08/29/2017  . Chronic pain syndrome 08/29/2017  . Pharmacologic therapy 08/29/2017  . Long term current use of opiate analgesic 08/29/2017  . Disorder of skeletal system 08/29/2017  . Problems influencing health status 08/29/2017  . Chronic sacroiliac joint pain 08/29/2017  . Hyperlipidemia  07/31/2017  . GERD (gastroesophageal reflux disease) 05/01/2017  . Anxiety 05/01/2017  . Depression 05/01/2017  . Healthcare maintenance 05/01/2017  . Chronic idiopathic constipation 05/01/2017  . Dyspnea 10/22/2014  . Obesity (BMI 30-39.9) 10/22/2014  . Obesity 09/12/2014  . Essential hypertension 09/12/2014  . Pneumonia, lobar ([place]) 09/11/2014  . Intermittent asthma 09/11/2014  . Cirrhosis ([place]) 09/08/2014  . Chronic hepatitis C without hepatic coma ([place]) 01/15/2014  . 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 Constipation 04/24/2012  . Anemia 05/25/2011  . Atypical chest pain 05/25/2011  . History of thyroid disease 05/25/2011  . Hepatitis C 05/25/2011  . Benign hypertension 05/25/2011  . Gastritis 05/25/2011  . Asthma 05/25/2011  . Stress incontinence 02/10/2011  . Urge incontinence 02/10/2011  . Osteoarthrosis involving multiple sites 10/17/2010    Rodney Yera L 12/20/2017, 11:54 AM  [facility] [address] [place], [place], [address] Phone: [phone]   Fax:  [phone]  Name: BECKHAM BUXBAUM MRN: [id number] Date of Birth: [birth year]  Geoffry Paradise, PT,DPT 12/20/17 11:54 AM Phone: [phone] Fax: [phone]

## 2017-12-20 NOTE — Patient Instructions (Signed)
Access Code: [email or web address]  URL: [email or web address]  Date: 12/20/2017  Prepared by: Geoffry Paradise   Exercises  Supine Lower Trunk Rotation - 15 reps - 1 sets - 1x daily - 7x weekly and then 3 reps with 30 sec. Hold on each side.  Beginner Bridge - 10 reps - 3 sets - 1x daily - 3x weekly  Clamshell - 10 reps - 2 sets - 1x daily - 3x weekly  Supine Figure 4 Piriformis Stretch - 3 reps - 1 sets - 30 hold - 1x daily - 7x weekly

## 2017-12-21 ENCOUNTER — Encounter: Payer: Self-pay | Admitting: Physical Therapy

## 2017-12-21 ENCOUNTER — Ambulatory Visit: Payer: Medicare Other | Admitting: Physical Therapy

## 2017-12-21 DIAGNOSIS — M6281 Muscle weakness (generalized): Secondary | ICD-10-CM

## 2017-12-21 DIAGNOSIS — R2681 Unsteadiness on feet: Secondary | ICD-10-CM

## 2017-12-21 DIAGNOSIS — R2689 Other abnormalities of gait and mobility: Secondary | ICD-10-CM | POA: Diagnosis not present

## 2017-12-21 NOTE — Therapy (Signed)
[facility] [address] [place], [place], [address] Phone: [phone]   Fax:  [phone]  Physical Therapy Treatment  Patient Details  Name: Rachael West MRN: [id number] Date of Birth: 1955/01/20 Referring Provider (PT): Mina Marble, NP   Encounter Date: 12/21/2017  PT End of Session - 12/21/17 1302    Visit Number  3    Number of Visits  9    Date for PT Re-Evaluation  01/12/18    Authorization Type  UHC Medicare    PT Start Time  3546    PT Stop Time  1015    PT Time Calculation (min)  42 min    Activity Tolerance  Patient tolerated treatment well;Other (comment)   needed frequent rest breaks   Behavior During Therapy  WFL for tasks assessed/performed       Past Medical History:  Diagnosis Date  . Anemia   . Anxiety   . Arthritis   . Asthma   . Depression   . GERD (gastroesophageal reflux disease)   . Hepatitis-C   . History of chemotherapy   . Hypertension   . Insomnia   . Lymphoma ([place]) 12/2013   aggressive lymphoma  . Personal history of non-Hodgkin lymphomas   . Vitamin D deficiency     Past Surgical History:  Procedure Laterality Date  . ABDOMINAL HYSTERECTOMY    . APPENDECTOMY    . BREAST BIOPSY Left 2015   lymphoma  . COLONOSCOPY  01/17/2011   Procedure: COLONOSCOPY;  Surgeon: Winfield Cunas., MD;  Location: [facility];  Service: Endoscopy;  Laterality: N/A;  . ESOPHAGOGASTRODUODENOSCOPY  01/17/2011   Procedure: ESOPHAGOGASTRODUODENOSCOPY (EGD);  Surgeon: Winfield Cunas., MD;  Location: [facility];  Service: Endoscopy;  Laterality: N/A;  . TRIGGER FINGER RELEASE      There were no vitals filed for this visit.  Subjective Assessment - 12/21/17 0938    Subjective  Denies falls or changes.  Stumbled a little.  Took pain medicine before therapy.    Pertinent History  HTN, asthma, hepatitis C, cirrhosis, LBP, DDD, spondylosis Cx spine, Cx forminal stenosis (C5-6, C6-7),  anxiety/depression, HA, fibromyalgia, anemia, hx of lymphoma (2015), HLD, Latex allergy    Patient Stated Goals  Walk normally again, improve balance.     Currently in Pain?  Yes    Pain Score  5     Pain Location  Back    Pain Orientation  Lower    Pain Descriptors / Indicators  Aching;Dull    Pain Type  Chronic pain    Multiple Pain Sites  Yes    Pain Score  6    Pain Location  Head    Pain Orientation  Anterior    Pain Descriptors / Indicators  Burning    Pain Type  Chronic pain    Pain Radiating Towards  feel like "a head band"    Pain Onset  More than a month ago    Pain Frequency  Several days a week    Pain Relieving Factors  nothing                       [facility] Adult PT Treatment/Exercise - 12/21/17 0001      Transfers   Transfers  Sit to Stand;Stand to Sit    Sit to Stand  5: Supervision;With upper extremity assist;From chair/3-in-1    Stand to Sit  5: Supervision;With upper extremity assist;To chair/3-in-1  Lumbar Exercises: Stretches   Single Knee to Chest Stretch  Right;Left;Other (comment)   seated in chair x 10 reps with 5 second hold using bil UE's    Other Lumbar Stretch Exercise  Seated in chair with forward flexion for low back stretch x 5 reps      Knee/Hip Exercises: Machines for Strengthening   Other Machine  Nustep x 7 minutes      Knee/Hip Exercises: Standing   Heel Raises  Both;15 reps;Other (comment)   with bil UE   Hip Abduction  AROM;Both;15 reps;Knee straight;Other (comment)   with bil UE support         Balance Exercises - 12/21/17 1203      Balance Exercises: Standing   Sidestepping  Upper extremity support;3 reps;Other (comment)  (Pended)    along 10' counter with UE support         PT Short Term Goals - 12/13/17 1609      PT SHORT TERM GOAL #1   Title  same as LTGs        PT Long Term Goals - 12/13/17 1609      PT LONG TERM GOAL #1   Title  Pt will be IND in HEP to improve the deficits listed above.  TARGET DATE FOR ALL LTGS: 01/10/18    Status  New      PT LONG TERM GOAL #2   Title  Pt will improve DGI score to >/=20/24 to decr. falls risk.     Status  New      PT LONG TERM GOAL #3   Title  Discuss aquatic PT and write goal as indicated.     Status  New      PT LONG TERM GOAL #4   Title  Pt will amb. 600' over even/uneven terrain, IND, without LOB to improve functional mobility.     Status  New      PT LONG TERM GOAL #5   Title  Pt will traverse 15 steps with one handrail at MOD I level to safely traverse apartment stairs.     Status  New            Plan - 12/21/17 1303    Clinical Impression Statement  Skilled session focused on standing balance and hip/back flexibility.  Pt reports pain with initial standing exercises but decreased after seated stretches and with Nustep.  Pt reports feeling increased mobility as well after treatment.  Continue PT per POC.    Rehab Potential  Good    Clinical Impairments Affecting Rehab Potential  see above.     PT Frequency  2x / week    PT Duration  4 weeks    PT Treatment/Interventions  ADLs/Self Care Home Management;Aquatic Therapy;Biofeedback;Therapeutic exercise;Therapeutic activities;Manual techniques;Vestibular;Functional mobility training;Orthotic Fit/Training;Stair training;Gait training;DME Instruction;Patient/family education;Neuromuscular re-education;Balance training    PT Next Visit Plan  Try starting with Nustep for flexibility/warm up then HEP for balance, posture. Write aquatic goal once aquatic PT can schedule pt.     Consulted and Agree with Plan of Care  Patient       Patient will benefit from skilled therapeutic intervention in order to improve the following deficits and impairments:  Abnormal gait, Decreased endurance, Decreased knowledge of use of DME, Decreased strength, Decreased balance, Pain, Impaired flexibility, Postural dysfunction(PT will not directly address pain but will monitor closely)  Visit  Diagnosis: Muscle weakness (generalized)  Other abnormalities of gait and mobility  Unsteadiness on feet  Problem List Patient Active Problem List   Diagnosis Date Noted  . Gait disturbance 11/28/2017  . Cervical facet syndrome (Bilateral) (R>L) 10/24/2017  . Spondylosis without myelopathy or radiculopathy, cervical region 10/24/2017  . Latex precautions, history of latex allergy 10/09/2017  . Elevated uric acid in blood 09/27/2017  . Chronic idiopathic gout 09/27/2017  . DDD (degenerative disc disease), cervical 09/27/2017  . 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 Chronic shoulder pain (Bilateral) (R>L) 09/26/2017  . Chronic hip pain (Bilateral) (L>R) 09/26/2017  . Cervicalgia 09/26/2017  . DDD (degenerative disc disease), lumbar 09/25/2017  . Lumbar facet arthropathy 09/25/2017  . Lumbar facet syndrome (Bilateral) (L>R) 09/25/2017  . Spondylosis without myelopathy or radiculopathy, lumbar region 09/25/2017  . DDD (degenerative disc disease), thoracic 09/25/2017  . Thoracolumbar dextroscoliosis 09/25/2017  . Vitamin D deficiency 09/25/2017  . Cervicogenic headache 09/25/2017  . Other dysphagia 09/24/2017  . Tendinitis of left rotator cuff 09/24/2017  . Tendinitis of right rotator cuff 09/24/2017  . Fibromyalgia 09/24/2017  . Elevated C-reactive protein (CRP) 09/03/2017  . Elevated sed rate 09/03/2017  . Chronic low back pain (Primary Area of Pain) (Bilateral) w/ sciatica (Left) 08/29/2017  . Chronic lower extremity pain (Secondary Area of Pain) (Left) 08/29/2017  . Chronic upper back pain [facility] Area of Pain) (Bilateral) (L>R) 08/29/2017  . Chronic neck pain (Fourth Area of Pain) (Bilateral) (L>R) 08/29/2017  . Occipital headache 08/29/2017  . Chronic knee pain (Bilateral) (R>L) 08/29/2017  . Chronic pain syndrome 08/29/2017  . Pharmacologic therapy 08/29/2017  . Long term current use of opiate analgesic 08/29/2017  . Disorder of  skeletal system 08/29/2017  . Problems influencing health status 08/29/2017  . Chronic sacroiliac joint pain 08/29/2017  . Hyperlipidemia 07/31/2017  . GERD (gastroesophageal reflux disease) 05/01/2017  . Anxiety 05/01/2017  . Depression 05/01/2017  . Healthcare maintenance 05/01/2017  . Chronic idiopathic constipation 05/01/2017  . Dyspnea 10/22/2014  . Obesity (BMI 30-39.9) 10/22/2014  . Obesity 09/12/2014  . Essential hypertension 09/12/2014  . Pneumonia, lobar ([place]) 09/11/2014  . Intermittent asthma 09/11/2014  . Cirrhosis ([place]) 09/08/2014  . Chronic hepatitis C without hepatic coma ([place]) 01/15/2014  . 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 Constipation 04/24/2012  . Anemia 05/25/2011  . Atypical chest pain 05/25/2011  . History of thyroid disease 05/25/2011  . Hepatitis C 05/25/2011  . Benign hypertension 05/25/2011  . Gastritis 05/25/2011  . Asthma 05/25/2011  . Stress incontinence 02/10/2011  . Urge incontinence 02/10/2011  . Osteoarthrosis involving multiple sites 10/17/2010   Narda Bonds, [place] 12/21/17 1:06 PM Phone: [phone] Fax: [facility] [address] [place], [place], [address] Phone: [phone]   Fax:  [phone]  Name: MELANE WINDHOLZ MRN: [id number] Date of Birth: [birth year]

## 2017-12-24 ENCOUNTER — Ambulatory Visit: Payer: Medicare Other | Admitting: Physical Therapy

## 2017-12-28 ENCOUNTER — Ambulatory Visit: Payer: Medicare Other

## 2017-12-28 DIAGNOSIS — R2681 Unsteadiness on feet: Secondary | ICD-10-CM

## 2017-12-28 DIAGNOSIS — R2689 Other abnormalities of gait and mobility: Secondary | ICD-10-CM | POA: Diagnosis not present

## 2017-12-28 DIAGNOSIS — M6281 Muscle weakness (generalized): Secondary | ICD-10-CM

## 2017-12-28 NOTE — Patient Instructions (Signed)
Access Code: [email or web address]  URL: [email or web address]  Date: 12/28/2017  Prepared by: Geoffry Paradise   Exercises  Supine Lower Trunk Rotation - 15 reps - 1 sets - 1x daily - 7x weekly  Beginner Bridge - 10 reps - 3 sets - 1x daily - 3x weekly  Clamshell - 10 reps - 2 sets - 1x daily - 3x weekly  Supine Figure 4 Piriformis Stretch - 3 reps - 1 sets - 30 hold - 1x daily - 7x weekly  Cervical Retraction with Overpressure - 10 reps - 1 sets - 2 hold - 1x daily - 7x weekly  Seated Scapular Retraction - 10 reps - 3 sets - 1x daily - 3x weekly  Walking with Head Rotation - 4 reps - 1 sets - 1x daily - 7x weekly  Walking with Head Nod - 4 reps - 1 sets - 1x daily - 7x weekly

## 2017-12-28 NOTE — Therapy (Signed)
[facility] [address] [place], [place], [address] Phone: [phone]   Fax:  [phone]  Physical Therapy Treatment  Patient Details  Name: Rachael West MRN: [id number] Date of Birth: Jul 24, 1955 Referring Provider (PT): Mina Marble, NP   Encounter Date: 12/28/2017  PT End of Session - 12/28/17 1042    Visit Number  4    Number of Visits  9    Date for PT Re-Evaluation  01/12/18    Authorization Type  UHC Medicare    PT Start Time  0958    PT Stop Time  1040    PT Time Calculation (min)  42 min    Equipment Utilized During Treatment  --   S for safety   Activity Tolerance  Patient tolerated treatment well;No increased pain    Behavior During Therapy  WFL for tasks assessed/performed       Past Medical History:  Diagnosis Date  . Anemia   . Anxiety   . Arthritis   . Asthma   . Depression   . GERD (gastroesophageal reflux disease)   . Hepatitis-C   . History of chemotherapy   . Hypertension   . Insomnia   . Lymphoma ([place]) 12/2013   aggressive lymphoma  . Personal history of non-Hodgkin lymphomas   . Vitamin D deficiency     Past Surgical History:  Procedure Laterality Date  . ABDOMINAL HYSTERECTOMY    . APPENDECTOMY    . BREAST BIOPSY Left 2015   lymphoma  . COLONOSCOPY  01/17/2011   Procedure: COLONOSCOPY;  Surgeon: Winfield Cunas., MD;  Location: [facility];  Service: Endoscopy;  Laterality: N/A;  . ESOPHAGOGASTRODUODENOSCOPY  01/17/2011   Procedure: ESOPHAGOGASTRODUODENOSCOPY (EGD);  Surgeon: Winfield Cunas., MD;  Location: [facility];  Service: Endoscopy;  Laterality: N/A;  . TRIGGER FINGER RELEASE      There were no vitals filed for this visit.  Subjective Assessment - 12/28/17 1000    Subjective  Pt denied falls or changes since last visit. Pt missed Monday appt, as she forgot about appt. She feels good today and amb. faster upon walking back to gym.     Pertinent  History  HTN, asthma, hepatitis C, cirrhosis, LBP, DDD, spondylosis Cx spine, Cx forminal stenosis (C5-6, C6-7), anxiety/depression, HA, fibromyalgia, anemia, hx of lymphoma (2015), HLD, Latex allergy    Patient Stated Goals  Walk normally again, improve balance.     Currently in Pain?  Yes    Pain Score  4     Pain Location  Back    Pain Orientation  Lower    Pain Descriptors / Indicators  Aching    Pain Onset  More than a month ago    Pain Frequency  Constant    Aggravating Factors   standing     Pain Relieving Factors  sitting        Therex and Neuro re-ed: Access Code: [email or web address]  URL: [email or web address]  Date: 12/28/2017  Prepared by: Geoffry Paradise   Exercises  Supine Lower Trunk Rotation - 15 reps - 1 sets - 1x daily - 7x weekly (REVIEW ONLY) Beginner Bridge - 10 reps - 3 sets - 1x daily - 3x weekly (REVIEW ONLY) Clamshell - 10 reps - 2 sets - 1x daily - 3x weekly (PERFORMED AS PT FORGOT THIS ONE) Supine Figure 4 Piriformis Stretch - 3 reps - 1 sets - 30 hold - 1x daily - 7x weekly (REVIEW ONLY) Cervical Retraction -  10 reps - 1 sets - 2 hold - 1x daily - 7x weekly  Seated Scapular Retraction - 10 reps - 3 sets - 1x daily - 3x weekly  Walking with Head Rotation - 4 reps - 1 sets - 1x daily - 7x weekly (NMR) Walking with Head Nod - 4 reps - 1 sets - 1x daily - 7x weekly (NMR)   Pt performed in sidelying, seated, and standing positions. Cues and demo for technique, S for safety. Pt reported dizziness (heaviness in head) during head turns/nods during balance/gait activities. No incr. In back pain reported. Pt also performed seated shoulder rolls to reduce tension x10 reps.                 [facility] Adult PT Treatment/Exercise - 12/28/17 1004      Exercises   Exercises  Knee/Hip      Knee/Hip Exercises: Aerobic   Other Aerobic  Scifit: all 4 extremities at RPM >50 (maintained > 60RPM for most of the time) performed to improve strength, endurance,  and warm-up to decr. back pain. 8 minutes. Distance: 0.38 miles             PT Education - 12/28/17 1041    Education Details  PT reviewed previous HEP per pt request and added posture and balance activities to HEP. PT educated pt on waiting in seated position prior to standing/walking if she feels lightheaded, as pt reported lightheadedness upon standing-pt is taking 2 BP meds.     Person(s) Educated  Patient    Methods  Explanation;Demonstration;Tactile cues;Verbal cues    Comprehension  Returned demonstration;Verbalized understanding       PT Short Term Goals - 12/13/17 1609      PT SHORT TERM GOAL #1   Title  same as LTGs        PT Long Term Goals - 12/13/17 1609      PT LONG TERM GOAL #1   Title  Pt will be IND in HEP to improve the deficits listed above. TARGET DATE FOR ALL LTGS: 01/10/18    Status  New      PT LONG TERM GOAL #2   Title  Pt will improve DGI score to >/=20/24 to decr. falls risk.     Status  New      PT LONG TERM GOAL #3   Title  Discuss aquatic PT and write goal as indicated.     Status  New      PT LONG TERM GOAL #4   Title  Pt will amb. 600' over even/uneven terrain, IND, without LOB to improve functional mobility.     Status  New      PT LONG TERM GOAL #5   Title  Pt will traverse 15 steps with one handrail at MOD I level to safely traverse apartment stairs.     Status  New            Plan - 12/28/17 1044    Clinical Impression Statement  Pt demonstrated progress as she was able to perform all activities without a rest break today or incr. in back pain. Pt did report dizziness (heaviness) during balance activities which require incr. vestibular input. PT focused skilled session on ensuring proper technique for strengthening HEP, and adding posture and balance activities to HEP. Continue with POC.     Rehab Potential  Good    Clinical Impairments Affecting Rehab Potential  see above.     PT Frequency  2x /  week    PT Duration  4 weeks     PT Treatment/Interventions  ADLs/Self Care Home Management;Aquatic Therapy;Biofeedback;Therapeutic exercise;Therapeutic activities;Manual techniques;Vestibular;Functional mobility training;Orthotic Fit/Training;Stair training;Gait training;DME Instruction;Patient/family education;Neuromuscular re-education;Balance training    PT Next Visit Plan  Try starting with SciFit for flexibility/warm up and review HEP prn. Write aquatic goal once aquatic PT can schedule pt. High level balance, perform vestibular exam (check orthostatics).    Consulted and Agree with Plan of Care  Patient       Patient will benefit from skilled therapeutic intervention in order to improve the following deficits and impairments:  Abnormal gait, Decreased endurance, Decreased knowledge of use of DME, Decreased strength, Decreased balance, Pain, Impaired flexibility, Postural dysfunction(PT will not directly address pain but will monitor closely)  Visit Diagnosis: Muscle weakness (generalized)  Other abnormalities of gait and mobility  Unsteadiness on feet     Problem List Patient Active Problem List   Diagnosis Date Noted  . Gait disturbance 11/28/2017  . Cervical facet syndrome (Bilateral) (R>L) 10/24/2017  . Spondylosis without myelopathy or radiculopathy, cervical region 10/24/2017  . Latex precautions, history of latex allergy 10/09/2017  . Elevated uric acid in blood 09/27/2017  . Chronic idiopathic gout 09/27/2017  . DDD (degenerative disc disease), cervical 09/27/2017  . 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 Chronic shoulder pain (Bilateral) (R>L) 09/26/2017  . Chronic hip pain (Bilateral) (L>R) 09/26/2017  . Cervicalgia 09/26/2017  . DDD (degenerative disc disease), lumbar 09/25/2017  . Lumbar facet arthropathy 09/25/2017  . Lumbar facet syndrome (Bilateral) (L>R) 09/25/2017  . Spondylosis without myelopathy or radiculopathy, lumbar region 09/25/2017  . DDD  (degenerative disc disease), thoracic 09/25/2017  . Thoracolumbar dextroscoliosis 09/25/2017  . Vitamin D deficiency 09/25/2017  . Cervicogenic headache 09/25/2017  . Other dysphagia 09/24/2017  . Tendinitis of left rotator cuff 09/24/2017  . Tendinitis of right rotator cuff 09/24/2017  . Fibromyalgia 09/24/2017  . Elevated C-reactive protein (CRP) 09/03/2017  . Elevated sed rate 09/03/2017  . Chronic low back pain (Primary Area of Pain) (Bilateral) w/ sciatica (Left) 08/29/2017  . Chronic lower extremity pain (Secondary Area of Pain) (Left) 08/29/2017  . Chronic upper back pain [facility] Area of Pain) (Bilateral) (L>R) 08/29/2017  . Chronic neck pain (Fourth Area of Pain) (Bilateral) (L>R) 08/29/2017  . Occipital headache 08/29/2017  . Chronic knee pain (Bilateral) (R>L) 08/29/2017  . Chronic pain syndrome 08/29/2017  . Pharmacologic therapy 08/29/2017  . Long term current use of opiate analgesic 08/29/2017  . Disorder of skeletal system 08/29/2017  . Problems influencing health status 08/29/2017  . Chronic sacroiliac joint pain 08/29/2017  . Hyperlipidemia 07/31/2017  . GERD (gastroesophageal reflux disease) 05/01/2017  . Anxiety 05/01/2017  . Depression 05/01/2017  . Healthcare maintenance 05/01/2017  . Chronic idiopathic constipation 05/01/2017  . Dyspnea 10/22/2014  . Obesity (BMI 30-39.9) 10/22/2014  . Obesity 09/12/2014  . Essential hypertension 09/12/2014  . Pneumonia, lobar ([place]) 09/11/2014  . Intermittent asthma 09/11/2014  . Cirrhosis ([place]) 09/08/2014  . Chronic hepatitis C without hepatic coma ([place]) 01/15/2014  . 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 Constipation 04/24/2012  . Anemia 05/25/2011  . Atypical chest pain 05/25/2011  . History of thyroid disease 05/25/2011  . Hepatitis C 05/25/2011  . Benign hypertension 05/25/2011  . Gastritis 05/25/2011  . Asthma 05/25/2011  . Stress incontinence 02/10/2011  . Urge incontinence 02/10/2011  . Osteoarthrosis  involving multiple sites 10/17/2010    , L 12/28/2017, 10:47 AM  [place]  [facility] [address] [place], [place], [address] Phone: [phone]   Fax:  [phone]  Name: Rachael West MRN: [id number] Date of Birth: [birth year]  Geoffry Paradise, PT,DPT 12/28/17 10:49 AM Phone: [phone] Fax: [phone]

## 2018-01-03 ENCOUNTER — Ambulatory Visit: Payer: Medicare Other

## 2018-01-04 ENCOUNTER — Ambulatory Visit: Payer: Medicare Other

## 2018-01-08 ENCOUNTER — Ambulatory Visit: Payer: Medicare Other

## 2018-01-10 ENCOUNTER — Ambulatory Visit: Payer: Medicare Other | Admitting: Physical Therapy

## 2018-01-24 ENCOUNTER — Ambulatory Visit: Payer: Medicare Other | Attending: Adult Health

## 2018-01-24 VITALS — BP 120/86

## 2018-01-24 DIAGNOSIS — M6281 Muscle weakness (generalized): Secondary | ICD-10-CM | POA: Insufficient documentation

## 2018-01-24 DIAGNOSIS — R2681 Unsteadiness on feet: Secondary | ICD-10-CM | POA: Diagnosis present

## 2018-01-24 DIAGNOSIS — R2689 Other abnormalities of gait and mobility: Secondary | ICD-10-CM | POA: Insufficient documentation

## 2018-01-24 NOTE — Therapy (Signed)
[facility] [address] [place], [place], [address] Phone: [phone]   Fax:  [phone]  Physical Therapy Treatment  Patient Details  Name: Rachael West MRN: [id number] Date of Birth: 19-Jul-1955 Referring Provider (PT): Mina Marble, NP   Encounter Date: 01/24/2018  PT End of Session - 01/24/18 1146    Visit Number  5    Number of Visits  9    Date for PT Re-Evaluation  --   original POC: 01/12/18 but pt missed 3 weeks of PT 2/2 family and transportation issues    Authorization Type  [employer] Medicare    PT Start Time  1101    PT Stop Time  1145    PT Time Calculation (min)  44 min    Equipment Utilized During Treatment  --   S for safety   Activity Tolerance  Patient tolerated treatment well;Patient limited by fatigue;No increased pain    Behavior During Therapy  WFL for tasks assessed/performed       Past Medical History:  Diagnosis Date  . Anemia   . Anxiety   . Arthritis   . Asthma   . Depression   . GERD (gastroesophageal reflux disease)   . Hepatitis-C   . History of chemotherapy   . Hypertension   . Insomnia   . Lymphoma ([facility]) 12/2013   aggressive lymphoma  . Personal history of non-Hodgkin lymphomas   . Vitamin D deficiency     Past Surgical History:  Procedure Laterality Date  . ABDOMINAL HYSTERECTOMY    . APPENDECTOMY    . BREAST BIOPSY Left 2015   lymphoma  . COLONOSCOPY  01/17/2011   Procedure: COLONOSCOPY;  Surgeon: Winfield Cunas., MD;  Location: [facility];  Service: Endoscopy;  Laterality: N/A;  . ESOPHAGOGASTRODUODENOSCOPY  01/17/2011   Procedure: ESOPHAGOGASTRODUODENOSCOPY (EGD);  Surgeon: Winfield Cunas., MD;  Location: [facility];  Service: Endoscopy;  Laterality: N/A;  . TRIGGER FINGER RELEASE      Vitals:   01/24/18 1104  BP: 120/86    Subjective Assessment - 01/24/18 1104    Subjective  Pt denied falls or changes since last visit. Pt missed last 3 weeks  of appt's 2/2 mom had an illness and lack of transportation. Pt hasn't been to gym since mom's illness but plans to go back. Pt reported some dizziness during DGI.     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     Currently in Pain?  Yes    Pain Score  4     Pain Location  Back    Pain Orientation  Mid;Lower    Pain Descriptors / Indicators  Aching    Pain Type  Chronic pain    Pain Radiating Towards  B buttocks    Pain Onset  More than a month ago    Pain Frequency  Constant    Aggravating Factors   bending    Pain Relieving Factors  medication and stretches                       [facility] Adult PT Treatment/Exercise - 01/24/18 1110      Ambulation/Gait   Ambulation/Gait  Yes    Ambulation/Gait Assistance  5: Supervision    Ambulation/Gait  Assistance Details  S to ensure safety. Cues to improve weight shifting over inclines/declines. Pt reported she feels lighter after walking and LBP decr. to 3/10.     Ambulation Distance (Feet)  600 Feet    Assistive device  None;Other (Comment)   and walking stick    Gait Pattern  Step-through pattern;Decreased arm swing - right;Decreased arm swing - left;Decreased stride length;Decreased dorsiflexion - right;Decreased dorsiflexion - left;Trunk flexed    Ambulation Surface  Level;Indoor;Unlevel;Outdoor;Paved    Stairs  Yes    Stairs Assistance  5: Supervision    Stairs Assistance Details (indicate cue type and reason)  S to ensure safety and cues to trial step over step to descend with two handrails.     Stair Management Technique  Two rails;Forwards;Alternating pattern;Step to pattern    Number of Stairs  16    Height of Stairs  6      Standardized Balance Assessment   Standardized Balance Assessment   Dynamic Gait Index      Dynamic Gait Index   Level Surface  Mild Impairment    Change in Gait Speed  Mild Impairment    Gait with Horizontal Head Turns  Mild Impairment    Gait with Vertical Head Turns  Mild Impairment    Gait and Pivot Turn  Mild Impairment    Step Over Obstacle  Moderate Impairment    Step Around Obstacles  Moderate Impairment    Steps  Moderate Impairment   one handrail but two feet to a stair to descend   Total Score  13      Exercises   Exercises  Knee/Hip      Knee/Hip Exercises: Aerobic   Other Aerobic  Scifit: all 4 extremities at RPM >40 (maintained > 60RPM for most of the time) performed to improve strength, endurance, and warm-up to decr. back pain. 5 minutes. Distance: 0.20 miles             PT Education - 01/24/18 1145    Education Details  PT discussed goal progress and outcome measure results. PT discussed the importance of performing HEP and to trial before next session and PT will review/modify as needed; pt reported she's performing intermittently. PT trialed SciFit to ensure pt is able to perform at gym since she hasn't been to gym in over a month.    Person(s) Educated  Patient    Methods  Explanation    Comprehension  Verbalized understanding       PT Short Term Goals - 12/13/17 1609      PT SHORT TERM GOAL #1   Title  same as LTGs        PT Long Term Goals - 01/24/18 1150      PT LONG TERM GOAL #1   Title  Pt will be IND in HEP to improve the deficits listed above. TARGET DATE FOR ALL LTGS: 01/10/18 (ALL UNMET GOALS WILL BE CARRIED OVER TO NEW POC: 02/08/18    Status  On-going      PT LONG TERM GOAL #2   Title  Pt will improve DGI score to >/=20/24 to decr. falls risk.     Baseline  13/20 ON 01/24/18    Status  Not Met      PT LONG TERM GOAL #3   Title  Discuss aquatic PT and write goal as indicated.     Status  Not Met      PT LONG TERM GOAL #4   Title  Pt will amb. 600' over even/uneven terrain, IND, without LOB to  improve functional mobility.     Status  Not Met      PT LONG TERM GOAL #5   Title  Pt will traverse 15 steps with one handrail at MOD I level to safely traverse apartment stairs.     Status  Not Met            Plan - 01/24/18 1147    Clinical Impression Statement  PT reassessed goals as pt missed over 3 weeks of PT 2/2 family and transportation issues. Pt did not meet any LTGs and DGI continues to indicate pt is at a risk of falls. PT added 2x/week for 2 weeks to fulfill original POC and will reassess at that time. Pt required rest breaks during session 2/2 fatigue but reported decr. back pain during session.     Rehab Potential  Good    Clinical Impairments Affecting Rehab Potential  see above.     PT Frequency  2x / week    PT Duration  4 weeks    PT Treatment/Interventions  ADLs/Self Care Home Management;Aquatic Therapy;Biofeedback;Therapeutic exercise;Therapeutic activities;Manual techniques;Vestibular;Functional mobility training;Orthotic Fit/Training;Stair training;Gait training;DME Instruction;Patient/family education;Neuromuscular re-education;Balance training    PT Next Visit Plan  Try starting with SciFit for flexibility/warm up and review HEP prn. Write aquatic goal once aquatic PT can schedule pt. High level balance, perform vestibular exam (check orthostatics).    Consulted and Agree with Plan of Care  Patient       Patient will benefit from skilled therapeutic intervention in order to improve the following deficits and impairments:  Abnormal gait, Decreased endurance, Decreased knowledge of use of DME, Decreased strength, Decreased balance, Pain, Impaired flexibility, Postural dysfunction(PT will not directly address pain but will monitor closely)  Visit Diagnosis: Other abnormalities of gait and mobility  Muscle weakness (generalized)  Unsteadiness on feet     Problem List Patient Active Problem List   Diagnosis Date Noted  . Gait disturbance 11/28/2017  .  Cervical facet syndrome (Bilateral) (R>L) 10/24/2017  . Spondylosis without myelopathy or radiculopathy, cervical region 10/24/2017  . Latex precautions, history of latex allergy 10/09/2017  . Elevated uric acid in blood 09/27/2017  . Chronic idiopathic gout 09/27/2017  . DDD (degenerative disc disease), cervical 09/27/2017  . 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 Chronic shoulder pain (Bilateral) (R>L) 09/26/2017  . Chronic hip pain (Bilateral) (L>R) 09/26/2017  . Cervicalgia 09/26/2017  . DDD (degenerative disc disease), lumbar 09/25/2017  . Lumbar facet arthropathy 09/25/2017  . Lumbar facet syndrome (Bilateral) (L>R) 09/25/2017  . Spondylosis without myelopathy or radiculopathy, lumbar region 09/25/2017  . DDD (degenerative disc disease), thoracic 09/25/2017  . Thoracolumbar dextroscoliosis 09/25/2017  . Vitamin D deficiency 09/25/2017  . Cervicogenic headache 09/25/2017  . Other dysphagia 09/24/2017  . Tendinitis of left rotator cuff 09/24/2017  . Tendinitis of right rotator cuff 09/24/2017  . Fibromyalgia 09/24/2017  . Elevated C-reactive protein (CRP) 09/03/2017  . Elevated sed rate 09/03/2017  . Chronic low back pain (Primary Area of Pain) (Bilateral) w/ sciatica (Left) 08/29/2017  . Chronic lower extremity pain (Secondary Area of Pain) (Left) 08/29/2017  . Chronic upper back pain [facility] Area of Pain) (Bilateral) (L>R) 08/29/2017  . Chronic neck pain (Fourth Area of Pain) (Bilateral) (L>R) 08/29/2017  . Occipital headache 08/29/2017  . Chronic knee pain (Bilateral) (R>L) 08/29/2017  . Chronic pain syndrome 08/29/2017  . Pharmacologic therapy 08/29/2017  . Long term current  use of opiate analgesic 08/29/2017  . Disorder of skeletal system 08/29/2017  . Problems influencing health status 08/29/2017  . Chronic sacroiliac joint pain 08/29/2017  . Hyperlipidemia 07/31/2017  . GERD (gastroesophageal reflux disease) 05/01/2017  . Anxiety  05/01/2017  . Depression 05/01/2017  . Healthcare maintenance 05/01/2017  . Chronic idiopathic constipation 05/01/2017  . Dyspnea 10/22/2014  . Obesity (BMI 30-39.9) 10/22/2014  . Obesity 09/12/2014  . Essential hypertension 09/12/2014  . Pneumonia, lobar ([place]) 09/11/2014  . Intermittent asthma 09/11/2014  . Cirrhosis ([place]) 09/08/2014  . Chronic hepatitis C without hepatic coma ([place]) 01/15/2014  . 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 Constipation 04/24/2012  . Anemia 05/25/2011  . Atypical chest pain 05/25/2011  . History of thyroid disease 05/25/2011  . Hepatitis C 05/25/2011  . Benign hypertension 05/25/2011  . Gastritis 05/25/2011  . Asthma 05/25/2011  . Stress incontinence 02/10/2011  . Urge incontinence 02/10/2011  . Osteoarthrosis involving multiple sites 10/17/2010    Cerria Randhawa L 01/24/2018, 11:52 AM  [facility] [address] [place], [place], [address] Phone: [phone]   Fax:  [phone]  Name: CELENA LANIUS MRN: [id number] Date of Birth: 12-14-1955  Geoffry Paradise, PT,DPT 01/24/18 11:52 AM Phone: [phone] Fax: [phone]

## 2018-01-29 ENCOUNTER — Ambulatory Visit: Payer: Medicare Other | Admitting: Rehabilitation

## 2018-02-05 ENCOUNTER — Ambulatory Visit: Payer: Medicare Other

## 2018-02-06 ENCOUNTER — Encounter: Payer: Self-pay | Admitting: Nurse Practitioner

## 2018-02-06 ENCOUNTER — Ambulatory Visit: Payer: Medicare Other | Attending: Nurse Practitioner | Admitting: Nurse Practitioner

## 2018-02-06 VITALS — BP 134/79 | HR 79 | Temp 98.1°F | Resp 16 | Ht 65.0 in | Wt 208.0 lb

## 2018-02-06 DIAGNOSIS — Z79891 Long term (current) use of opiate analgesic: Secondary | ICD-10-CM | POA: Diagnosis present

## 2018-02-06 DIAGNOSIS — M47816 Spondylosis without myelopathy or radiculopathy, lumbar region: Secondary | ICD-10-CM

## 2018-02-06 DIAGNOSIS — E559 Vitamin D deficiency, unspecified: Secondary | ICD-10-CM | POA: Diagnosis present

## 2018-02-06 DIAGNOSIS — G894 Chronic pain syndrome: Secondary | ICD-10-CM

## 2018-02-06 DIAGNOSIS — M542 Cervicalgia: Secondary | ICD-10-CM

## 2018-02-06 DIAGNOSIS — G8929 Other chronic pain: Secondary | ICD-10-CM | POA: Diagnosis present

## 2018-02-06 DIAGNOSIS — M79605 Pain in left leg: Secondary | ICD-10-CM | POA: Insufficient documentation

## 2018-02-06 MED ORDER — MAGNESIUM 500 MG PO CAPS: 500.0000 mg | ORAL_CAPSULE | Freq: Two times a day (BID) | ORAL | 5 refills | Status: DC

## 2018-02-06 MED ORDER — TRAMADOL HCL 50 MG PO TABS: 50.0000 mg | ORAL_TABLET | Freq: Four times a day (QID) | ORAL | 2 refills | Status: DC | PRN

## 2018-02-06 MED ORDER — GNP CALCIUM 1200 1200-1000 MG-UNIT PO CHEW: 1200.0000 mg | CHEWABLE_TABLET | Freq: Every day | ORAL | 5 refills | Status: DC

## 2018-02-06 NOTE — Progress Notes (Signed)
Nursing Pain Medication Assessment:  Safety precautions to be maintained throughout the outpatient stay will include: orient to surroundings, keep bed in low position, maintain call bell within reach at all times, provide assistance with transfer out of bed and ambulation.  Medication Inspection Compliance: Pill count conducted under aseptic conditions, in front of the patient. Neither the pills nor the bottle was removed from the patient's sight at any time. Once count was completed pills were immediately returned to the patient in their original bottle.  Medication: Tramadol (Ultram) Pill/Patch Count: 49 of 296 pills remain Pill/Patch Appearance: Markings consistent with prescribed medication Bottle Appearance: Standard pharmacy container. Clearly labeled. Filled Date: 7 / 08 / 2019 Last Medication intake:  Today

## 2018-02-06 NOTE — Progress Notes (Signed)
Patient's Name: Rachael West  MRN: [id number]  Referring Provider: Esaw Grandchild, NP  DOB: Jul 09, 1955  PCP: Esaw Grandchild, NP  DOS: 02/06/2018  Note by: Vevelyn Francois NP  Service setting: Ambulatory outpatient  Specialty: Interventional Pain Management  Location: [facility] (AMB) Pain Management Facility    Patient type: Established    Primary Reason(s) for Visit: Encounter for prescription drug management. (Level of risk: moderate)  CC: Generalized Body Aches (fibromyalgia); Neck Pain; Back Pain (lumbar bilateral ); Knee Pain (bilateral); and Ankle Pain (bilateral )  HPI  Rachael West is a 63 y.o. year old, female patient, who comes today for a medication management evaluation. She has Large B-cell lymphoma ([place]); Chronic hepatitis C without hepatic coma ([place]); Cirrhosis ([place]); Pneumonia, lobar ([place]); Intermittent asthma; Obesity; Essential hypertension; Dyspnea; Obesity (BMI 30-39.9); GERD (gastroesophageal reflux disease); Anxiety; Depression; Healthcare maintenance; Chronic idiopathic constipation; Hyperlipidemia; Anemia; Atypical chest pain; Constipation; History of thyroid disease; Osteoarthrosis involving multiple sites; Stress incontinence; Urge incontinence; Hepatitis C; Benign hypertension; Gastritis; Asthma; Chronic low back pain (Primary Area of Pain) (Bilateral) w/ sciatica (Left); Chronic lower extremity pain (Secondary Area of Pain) (Left); Chronic upper back pain [facility] Area of Pain) (Bilateral) (L>R); Chronic neck pain (Fourth Area of Pain) (Bilateral) (L>R); Occipital headache; Chronic knee pain (Bilateral) (R>L); Chronic pain syndrome; Pharmacologic therapy; Long term current use of opiate analgesic; Disorder of skeletal system; Problems influencing health status; Chronic sacroiliac joint pain; Elevated C-reactive protein (CRP); Elevated sed rate; DDD (degenerative disc disease), lumbar; Lumbar facet arthropathy; Lumbar facet syndrome (Bilateral) (L>R); Spondylosis without  myelopathy or radiculopathy, lumbar region; DDD (degenerative disc disease), thoracic; Thoracolumbar dextroscoliosis; Vitamin D deficiency; Cervicogenic headache; Other dysphagia; Tendinitis of left rotator cuff; Tendinitis of right rotator cuff; Fibromyalgia; Chronic shoulder pain (Bilateral) (R>L); Chronic hip pain (Bilateral) (L>R); Cervicalgia; Elevated uric acid in blood; Chronic idiopathic gout; DDD (degenerative disc disease), cervical; 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 Her primarily concern today is the Generalized Body Aches (fibromyalgia); Neck Pain; Back Pain (lumbar bilateral ); Knee Pain (bilateral); and Ankle Pain (bilateral )  Pain Assessment: Location: Lower, Left, Right Back(see visit info for all pain sites ) Radiating: back pain into left hip  Onset: More than a month ago Duration: Chronic pain Quality: Discomfort, Aching, Pressure, Tender, Squeezing(floating pain that goes up and down back and into neck ) Severity: 3 /10 (subjective, self-reported pain score)  Note: Reported level is compatible with observation.                          Effect on ADL: medications allow her to more during the day  Timing: Constant Modifying factors: PT, medications BP: 134/79  HR: 79  Rachael West was last scheduled for an appointment on 08/29/2017 for medication management. During today's appointment we reviewed Rachael West's chronic pain status, as well as her outpatient medication regimen.  The patient  reports no history of drug use. Her body mass index is 34.61 kg/m.  Further details on both, my assessment(s), as well as the proposed treatment plan, please see below.  Controlled Substance Pharmacotherapy Assessment REMS (Risk Evaluation and Mitigation  Strategy)  Analgesic: Tramadol 50 mg 2 tablets every 6 hours. Highest recorded MME/day:'20mg'$ /day MME/day:'40mg'$ /day Rachael Billow, RN  02/06/2018  9:24 AM  Sign when Signing Visit Nursing Pain  Medication Assessment:  Safety precautions to be maintained throughout the outpatient stay will include: orient to surroundings, keep bed in low position, maintain call bell within reach at all times, provide assistance with transfer out of bed and ambulation.  Medication Inspection Compliance: Pill count conducted under aseptic conditions, in front of the patient. Neither the pills nor the bottle was removed from the patient's sight at any time. Once count was completed pills were immediately returned to the patient in their original bottle.  Medication: Tramadol (Ultram) Pill/Patch Count: 49 of 296 pills remain Pill/Patch Appearance: Markings consistent with prescribed medication Bottle Appearance: Standard pharmacy container. Clearly labeled. Filled Date: 74 / 08 / 2019 Last Medication intake:  Today   Pharmacokinetics: Liberation and absorption (onset of action): WNL Distribution (time to peak effect): WNL Metabolism and excretion (duration of action): WNL         Pharmacodynamics: Desired effects: Analgesia: [name] reports >50% benefit. Functional ability: Patient reports that medication allows her to accomplish basic ADLs Clinically meaningful improvement in function (CMIF): Sustained CMIF goals met Perceived effectiveness: Described as relatively effective, allowing for increase in activities of daily living (ADL) Undesirable effects: Side-effects or Adverse reactions: None reported Monitoring: [place] PMP: Online review of the past 79-monthperiod conducted. Compliant with practice rules and regulations Last UDS on record: Summary  Date Value Ref Range Status  08/29/2017 FINAL  Final    Comment:     ==================================================================== TOXASSURE COMP DRUG ANALYSIS,UR ==================================================================== Test                             Result       Flag       Units Drug Present and Declared for Prescription Verification   Cyclobenzaprine                PRESENT      EXPECTED   Desmethylcyclobenzaprine       PRESENT      EXPECTED    Desmethylcyclobenzaprine is an expected metabolite of    cyclobenzaprine.   Sertraline                     PRESENT      EXPECTED   Desmethylsertraline            PRESENT      EXPECTED    Desmethylsertraline is an expected metabolite of sertraline.   Trazodone                      PRESENT      EXPECTED   1,3 chlorophenyl piperazine    PRESENT      EXPECTED    1,3-chlorophenyl piperazine is an expected metabolite of    trazodone.   Hydroxyzine                    PRESENT      EXPECTED   Atenolol                       PRESENT      EXPECTED Drug Present not Declared for Prescription Verification   Tramadol                       3363         UNEXPECTED ng/mg creat   O-Desmethyltramadol            1058  UNEXPECTED ng/mg creat   N-Desmethyltramadol            727          UNEXPECTED ng/mg creat    Source of tramadol is a prescription medication.    O-desmethyltramadol and N-desmethyltramadol are expected    metabolites of tramadol.   Naproxen                       PRESENT      UNEXPECTED ==================================================================== Test                      Result    Flag   Units      Ref Range   Creatinine              132              mg/dL      >=20 ==================================================================== Declared Medications:  The flagging and interpretation on this report are based on the  following declared medications.  Unexpected results may arise from  inaccuracies in the declared medications.  **Note: The testing scope of this panel includes  these medications:  Atenolol (Tenormin)  Cyclobenzaprine (Flexeril)  Hydroxyzine (Vistaril)  Sertraline (Zoloft)  Trazodone (Desyrel)  **Note: The testing scope of this panel does not include following  reported medications:  Albuterol (Duoneb)  Albuterol (Ventolin HFA)  Bimatoprost (Lumigan)  Buspirone (BuSpar)  Ipratropium (Duoneb)  Linaclotide (Linzess)  Lisinopril  Meloxicam (Mobic)  Montelukast (Singulair)  Omeprazole (Prilosec)  Paliperidone (Invega)  Polyethylene Glycol  Prazosin (Minipress)  Solifenacin (Vesicare) ==================================================================== For clinical consultation, please call [phone]. ====================================================================    UDS interpretation: Unexpected findings:         Initial UDS Medication Assessment Form: Reviewed. Patient indicates being compliant with therapy Treatment compliance: Not applicable. Initial evaluation Risk Assessment Profile: Aberrant behavior: See prior evaluations. None observed or detected today Comorbid factors increasing risk of overdose: See prior notes. No additional risks detected today Opioid risk tool (ORT) (Total Score): 3 Personal History of Substance Abuse (SUD-Substance use disorder):  Alcohol: Negative  Illegal Drugs: Negative  Rx Drugs: Negative  ORT Risk Level calculation: Low Risk Risk of substance use disorder (SUD): Low Opioid Risk Tool - 02/06/18 1043      Family History of Substance Abuse   Alcohol  Negative    Illegal Drugs  Negative    Rx Drugs  Negative      Personal History of Substance Abuse   Alcohol  Negative    Illegal Drugs  Negative    Rx Drugs  Negative      Age   Age between 18-45 years   No      History of Preadolescent Sexual Abuse   History of Preadolescent Sexual Abuse  Negative or Female      Psychological Disease   Psychological Disease  Positive    OCD  Negative    Bipolar  Negative    Schizophrenia   Negative    Depression  Positive   PTSD and Anxiety      Total Score   Opioid Risk Tool Scoring  3    Opioid Risk Interpretation  Low Risk      ORT Scoring interpretation table:  Score <3 = Low Risk for SUD  Score between 4-7 = Moderate Risk for SUD  Score >8 = High Risk for Opioid Abuse   Risk Mitigation Strategies:  Patient Counseling: Covered Patient-Prescriber Agreement (PPA): Present  and active  Notification to other healthcare providers: Done  Pharmacologic Plan: No change in therapy, at this time.             Laboratory Chemistry  Inflammation Markers (CRP: Acute Phase) (ESR: Chronic Phase) Lab Results  Component Value Date   CRP 12 (H) 08/29/2017   ESRSEDRATE 46 (H) 08/29/2017                         Rheumatology Markers Lab Results  Component Value Date   RF 10.6 09/26/2017   ANA Negative 09/26/2017   LABURIC 7.8 (H) 09/26/2017   URICUR 22.8 09/26/2017                        Renal Function Markers Lab Results  Component Value Date   BUN 8 08/29/2017   CREATININE 0.84 08/29/2017   BCR 10 (L) 08/29/2017   GFRAA 85 08/29/2017   GFRNONAA 74 08/29/2017                             Hepatic Function Markers Lab Results  Component Value Date   AST 26 08/29/2017   ALT 20 08/06/2017   ALBUMIN 4.1 08/29/2017   ALKPHOS 80 08/29/2017   HCVAB REACTIVE (A) 01/12/2014   LIPASE 34 10/31/2010                        Electrolytes Lab Results  Component Value Date   NA 138 08/29/2017   K 4.5 08/29/2017   CL 101 08/29/2017   CALCIUM 8.7 08/29/2017   MG 2.2 08/29/2017                        Neuropathy Markers Lab Results  Component Value Date   VITAMINB12 728 08/29/2017   HGBA1C 5.7 (H) 05/09/2017   HIV NONREACTIVE 01/12/2014                        CNS Tests No results found for: COLORCSF, APPEARCSF, RBCCOUNTCSF, WBCCSF, POLYSCSF, LYMPHSCSF, EOSCSF, PROTEINCSF, GLUCCSF, JCVIRUS, CSFOLI, IGGCSF                      Bone Pathology Markers Lab Results   Component Value Date   25OHVITD1 21 (L) 08/29/2017   25OHVITD2 <1.0 08/29/2017   25OHVITD3 21 08/29/2017                         Coagulation Parameters Lab Results  Component Value Date   INR 1.12 11/11/2014   LABPROT 14.6 11/11/2014   APTT 31 11/11/2014   PLT 192 08/06/2017                        Cardiovascular Markers Lab Results  Component Value Date   CKTOTAL 81 11/01/2010   CKMB 1.0 11/01/2010   HGB 11.2 (L) 08/06/2017   HCT 34.5 (L) 08/06/2017                         CA Markers No results found for: CEA, CA125, LABCA2                      Note: Lab results reviewed.  Recent Diagnostic Imaging Results  DG C-Arm  1-60 Min-No Report Fluoroscopy was utilized by the requesting physician.  No radiographic  interpretation.   Complexity Note: Imaging results reviewed. Results shared with Ms. Goding, using Layman's terms.                         Meds   Current Outpatient Medications:  .  albuterol (PROVENTIL HFA;VENTOLIN HFA) 108 (90 Base) MCG/ACT inhaler, Inhale 2 puffs into the lungs 4 (four) times daily., Disp: 1 Inhaler, Rfl: 5 .  atenolol (TENORMIN) 25 MG tablet, Take 1 tablet (25 mg total) by mouth daily., Disp: 90 tablet, Rfl: 3 .  busPIRone (BUSPAR) 5 MG tablet, Take 1 tablet (5 mg total) by mouth 2 (two) times daily. (Patient taking differently: Take 5 mg by mouth 3 (three) times daily. ), Disp: , Rfl:  .  Calcium Carbonate-Vit D-Min (GNP CALCIUM 1200) 1200-1000 MG-UNIT CHEW, Chew 1,200 mg by mouth daily with breakfast. Take in combination with vitamin D and magnesium., Disp: 30 tablet, Rfl: 5 .  cyclobenzaprine (FLEXERIL) 5 MG tablet, Take 1 tablet by mouth at bedtime., Disp: , Rfl: 3 .  hydrOXYzine (VISTARIL) 50 MG capsule, Take 50 mg by mouth at bedtime., Disp: , Rfl:  .  ipratropium-albuterol (DUONEB) 0.5-2.5 (3) MG/3ML SOLN, Inhale 3 mLs into the lungs See admin instructions., Disp: 360 mL, Rfl: 5 .  latanoprost (XALATAN) 0.005 % ophthalmic solution,  Place 1 drop into both eyes daily., Disp: , Rfl:  .  linaclotide (LINZESS) 72 MCG capsule, Take 1 capsule (72 mcg total) by mouth daily before breakfast., Disp: 30 capsule, Rfl:  .  lisinopril (PRINIVIL,ZESTRIL) 5 MG tablet, Take 1 tablet (5 mg total) by mouth daily., Disp: 90 tablet, Rfl: 3 .  Magnesium 500 MG CAPS, Take 1 capsule (500 mg total) by mouth 2 (two) times daily at 8 am and 10 pm., Disp: 60 capsule, Rfl: 5 .  meloxicam (MOBIC) 15 MG tablet, Take 15 mg by mouth daily., Disp: , Rfl: 3 .  montelukast (SINGULAIR) 10 MG tablet, Take 1 tablet (10 mg total) by mouth daily., Disp: 90 tablet, Rfl: 3 .  omeprazole (PRILOSEC) 40 MG capsule, 1 CAPSULE BY MOUTH DAILY, Disp: , Rfl: 3 .  ondansetron (ZOFRAN) 4 MG tablet, Take 4 mg by mouth as needed., Disp: , Rfl:  .  paliperidone (INVEGA) 6 MG 24 hr tablet, Take 6 mg by mouth every morning., Disp: , Rfl:  .  polyethylene glycol (MIRALAX / GLYCOLAX) packet, Take 17 g by mouth 2 (two) times daily., Disp: , Rfl:  .  prazosin (MINIPRESS) 1 MG capsule, Take 1 mg by mouth at bedtime., Disp: , Rfl:  .  sertraline (ZOLOFT) 100 MG tablet, Take 200 mg by mouth every morning., Disp: , Rfl:  .  sertraline (ZOLOFT) 50 MG tablet, Take 50 mg by mouth daily., Disp: , Rfl:  .  [START ON 03/08/2018] traMADol (ULTRAM) 50 MG tablet, Take 1 tablet (50 mg total) by mouth every 6 (six) hours as needed for severe pain., Disp: 120 tablet, Rfl: 2 .  traZODone (DESYREL) 50 MG tablet, 1 TABLET BY MOUTH AT BEDTIME, Disp: , Rfl: 5 .  VESICARE 10 MG tablet, Take 10 mg by mouth daily., Disp: , Rfl: 3  ROS  Constitutional: Denies any fever or chills Gastrointestinal: No reported hemesis, hematochezia, vomiting, or acute GI distress Musculoskeletal: Denies any acute onset joint swelling, redness, loss of ROM, or weakness Neurological: No reported episodes of acute onset apraxia, aphasia, dysarthria,  agnosia, amnesia, paralysis, loss of coordination, or loss of  consciousness  Allergies  Rachael West is allergic to latex; penicillins; codeine; cortisone; and sulfa antibiotics.  PFSH  Drug: Ms. Arseneau  reports no history of drug use. Alcohol:  reports no history of alcohol use. Tobacco:  reports that she quit smoking about 28 years ago. Her smoking use included cigarettes. She has a 30.00 pack-year smoking history. She has never used smokeless tobacco. Medical:  has a past medical history of Anemia, Anxiety, Arthritis, Asthma, Depression, GERD (gastroesophageal reflux disease), Hepatitis-C, History of chemotherapy, Hypertension, Insomnia, Lymphoma ([place]) (12/2013), Personal history of non-Hodgkin lymphomas, and Vitamin D deficiency. Surgical: Ms. Chinchilla  has a past surgical history that includes Appendectomy; Trigger finger release; Esophagogastroduodenoscopy (01/17/2011); Colonoscopy (01/17/2011); Abdominal hysterectomy; and Breast biopsy (Left, 2015). Family: family history includes Allergies in her mother; Asthma in her mother and sister; Breast cancer in her cousin; Colon cancer in her paternal aunt; Diabetes in her maternal grandfather and maternal grandmother; Heart disease in her brother, father, paternal grandfather, and paternal grandmother; Liver cancer in her paternal uncle; Ovarian cancer in an other family member; Pancreatic cancer in an other family member.  Constitutional Exam  General appearance: Well nourished, well developed, and well hydrated. In no apparent acute distress Vitals:   02/06/18 0926  BP: 134/79  Pulse: 79  Resp: 16  Temp: 98.1 F (36.7 C)  TempSrc: Oral  SpO2: 100%  Weight: 208 lb (94.3 kg)  Height: '5\' 5"'$  (1.651 m)  Psych/Mental status: Alert, oriented x 3 (person, place, & time)       Eyes: PERLA Respiratory: No evidence of acute respiratory distress  Cervical Spine Area Exam  Skin & Axial Inspection: No masses, redness, edema, swelling, or associated skin lesions Alignment:  Symmetrical Functional ROM: Unrestricted ROM      Stability: No instability detected Muscle Tone/Strength: Functionally intact. No obvious neuro-muscular anomalies detected. Sensory (Neurological): Unimpaired Palpation: No palpable anomalies              Upper Extremity (UE) Exam    Side: Right upper extremity  Side: Left upper extremity  Skin & Extremity Inspection: Skin color, temperature, and hair growth are WNL. No peripheral edema or cyanosis. No masses, redness, swelling, asymmetry, or associated skin lesions. No contractures.  Skin & Extremity Inspection: Skin color, temperature, and hair growth are WNL. No peripheral edema or cyanosis. No masses, redness, swelling, asymmetry, or associated skin lesions. No contractures.  Functional ROM: Unrestricted ROM          Functional ROM: Unrestricted ROM          Muscle Tone/Strength: Functionally intact. No obvious neuro-muscular anomalies detected.  Muscle Tone/Strength: Functionally intact. No obvious neuro-muscular anomalies detected.  Sensory (Neurological): Unimpaired          Sensory (Neurological): Unimpaired          Palpation: No palpable anomalies              Palpation: No palpable anomalies                   Thoracic Spine Area Exam  Skin & Axial Inspection: No masses, redness, or swelling Alignment: Symmetrical Functional ROM: Unrestricted ROM Stability: No instability detected Muscle Tone/Strength: Functionally intact. No obvious neuro-muscular anomalies detected. Sensory (Neurological): Unimpaired Muscle strength & Tone: No palpable anomalies  Lumbar Spine Area Exam  Skin & Axial Inspection: No masses, redness, or swelling Alignment: Symmetrical Functional ROM: Unrestricted ROM  Stability: No instability detected Muscle Tone/Strength: Functionally intact. No obvious neuro-muscular anomalies detected. Sensory (Neurological): Unimpaired Palpation: No palpable anomalies         Gait & Posture Assessment   Ambulation: Unassisted Gait: Relatively normal for age and body habitus Posture: WNL   Lower Extremity Exam    Side: Right lower extremity  Side: Left lower extremity  Stability: No instability observed          Stability: No instability observed          Skin & Extremity Inspection: Skin color, temperature, and hair growth are WNL. No peripheral edema or cyanosis. No masses, redness, swelling, asymmetry, or associated skin lesions. No contractures.  Skin & Extremity Inspection: Skin color, temperature, and hair growth are WNL. No peripheral edema or cyanosis. No masses, redness, swelling, asymmetry, or associated skin lesions. No contractures.  Functional ROM: Unrestricted ROM                  Functional ROM: Unrestricted ROM                  Muscle Tone/Strength: Functionally intact. No obvious neuro-muscular anomalies detected.  Muscle Tone/Strength: Functionally intact. No obvious neuro-muscular anomalies detected.  Sensory (Neurological): Unimpaired        Sensory (Neurological): Unimpaired            Palpation: No palpable anomalies  Palpation: No palpable anomalies   Assessment  Primary Diagnosis & Pertinent Problem List: The primary encounter diagnosis was Cervicalgia. Diagnoses of Spondylosis without myelopathy or radiculopathy, lumbar region, Chronic lower extremity pain (Secondary Area of Pain) (Left), Vitamin D deficiency, Chronic pain syndrome, and Long term prescription opiate use were also pertinent to this visit.  Status Diagnosis  Controlled Controlled Controlled 1. Cervicalgia   2. Spondylosis without myelopathy or radiculopathy, lumbar region   3. Chronic lower extremity pain (Secondary Area of Pain) (Left)   4. Vitamin D deficiency   5. Chronic pain syndrome   6. Long term prescription opiate use     Problems updated and reviewed during this visit: No problems updated. Plan of Care  Pharmacotherapy (Medications Ordered): Meds ordered this encounter   Medications  . Calcium Carbonate-Vit D-Min (GNP CALCIUM 1200) 1200-1000 MG-UNIT CHEW    Sig: Chew 1,200 mg by mouth daily with breakfast. Take in combination with vitamin D and magnesium.    Dispense:  30 tablet    Refill:  5    Do not place medication on "Automatic Refill".  May substitute with similar over-the-counter product.    Order Specific Question:   Supervising Provider    Answer:   Milinda Pointer [phone]  . Magnesium 500 MG CAPS    Sig: Take 1 capsule (500 mg total) by mouth 2 (two) times daily at 8 am and 10 pm.    Dispense:  60 capsule    Refill:  5    Do not place medication on "Automatic Refill".  The patient may use similar over-the-counter product.    Order Specific Question:   Supervising Provider    Answer:   Milinda Pointer [phone]  . traMADol (ULTRAM) 50 MG tablet    Sig: Take 1 tablet (50 mg total) by mouth every 6 (six) hours as needed for severe pain.    Dispense:  120 tablet    Refill:  2    [place] STOP ACT - Not applicable. Fill one day early if pharmacy is closed on scheduled refill date. Do not fill until: 01/18/2018. Must  last 30 days. To last until: 02/17/2018.    Order Specific Question:   Supervising Provider    Answer:   Milinda Pointer [[id number]]   New Prescriptions   No medications on file   Medications administered today: [name] had no medications administered during this visit. Lab-work, procedure(s), and/or referral(s): Orders Placed This Encounter  Procedures  . ToxASSURE Select 13 (MW), Urine   Imaging and/or referral(s): None  Interventional management options: Planned, scheduled, and/or pending:    Not at this time.   Considering:   Diagnostic left-sided lumbar epidural steroid injection Diagnostic bilateral lumbar facet nerve block Possible bilateral lumbar facet radiofrequency ablation Diagnostic bilateral thoracic facet nerve block Possiblebilateral thoracic facet radiofrequency ablation Diagnostic  bilateral cervical facet nerve block Possible bilateral cervical facet radiofrequency ablation Diagnostic bilateral greater occipital nerve blocks Diagnostic trigger point injections   PRN Procedures:   None at this time    Provider-requested follow-up: Return in about 3 months (around 05/08/2018) for MedMgmt.  Future Appointments  Date Time Provider [place]  02/08/2018  8:00 AM Elza Rafter, PT [facility]  04/29/2018  8:45 AM Mellody Dance, DO [facility] None  05/08/2018 11:00 AM Vevelyn Francois, NP [facility] None  08/06/2018 10:00 AM [facility] None  08/06/2018 10:30 AM Nicholas Lose, MD [facility] None   Primary Care Physician: Esaw Grandchild, NP Location: [facility] Out[facility] Management Facility Note by: Vevelyn Francois NP Date: 02/06/2018; Time: 7:19 PM  Pain Score Disclaimer: We use the NRS-11 scale. This is a self-reported, subjective measurement of pain severity with only modest accuracy. It is used primarily to identify changes within a particular patient. It must be understood that outpatient pain scales are significantly less accurate that those used for research, where they can be applied under ideal controlled circumstances with minimal exposure to variables. In reality, the score is likely to be a combination of pain intensity and pain affect, where pain affect describes the degree of emotional arousal or changes in action readiness caused by the sensory experience of pain. Factors such as social and work situation, setting, emotional state, anxiety levels, expectation, and prior pain experience may influence pain perception and show large inter-individual differences that may also be affected by time variables.  Patient instructions provided during this appointment: Patient Instructions   ____________________________________________________________________________________________  Medication Rules  Purpose: To inform patients,  and their family members, of our rules and regulations.  Applies to: All patients receiving prescriptions (written or electronic).  Pharmacy of record: Pharmacy where electronic prescriptions will be sent. If written prescriptions are taken to a different pharmacy, please inform the nursing staff. The pharmacy listed in the electronic medical record should be the one where you would like electronic prescriptions to be sent.  Electronic prescriptions: In compliance with the [place] (STOP) Act of 2017 (Session Lanny Cramp [phone]), effective January 09, 2018, all controlled substances must be electronically prescribed. Calling prescriptions to the pharmacy will cease to exist.  Prescription refills: Only during scheduled appointments. Applies to all prescriptions.  NOTE: The following applies primarily to controlled substances (Opioid* Pain Medications).   Patient's responsibilities: 1. Pain Pills: Bring all pain pills to every appointment (except for procedure appointments). 2. Pill Bottles: Bring pills in original pharmacy bottle. Always bring the newest bottle. Bring bottle, even if empty. 3. Medication refills: You are responsible for knowing and keeping track of what medications you take and those you need refilled. The day before your  appointment: write a list of all prescriptions that need to be refilled. The day of the appointment: give the list to the admitting nurse. Prescriptions will be written only during appointments. If you forget a medication: it will not be "Called in", "Faxed", or "electronically sent". You will need to get another appointment to get these prescribed. No early refills. Do not call asking to have your prescription filled early. 4. Prescription Accuracy: You are responsible for carefully inspecting your prescriptions before leaving our office. Have the discharge nurse carefully go over each prescription with you, before taking  them home. Make sure that your name is accurately spelled, that your address is correct. Check the name and dose of your medication to make sure it is accurate. Check the number of pills, and the written instructions to make sure they are clear and accurate. Make sure that you are given enough medication to last until your next medication refill appointment. 5. Taking Medication: Take medication as prescribed. When it comes to controlled substances, taking less pills or less frequently than prescribed is permitted and encouraged. Never take more pills than instructed. Never take medication more frequently than prescribed.  6. Inform other Doctors: Always inform, all of your healthcare providers, of all the medications you take. 7. Pain Medication from other Providers: You are not allowed to accept any additional pain medication from any other Doctor or Healthcare provider. There are two exceptions to this rule. (see below) In the event that you require additional pain medication, you are responsible for notifying us, as stated below. 8. Medication Agreement: You are responsible for carefully reading and following our Medication Agreement. This must be signed before receiving any prescriptions from our practice. Safely store a copy of your signed Agreement. Violations to the Agreement will result in no further prescriptions. (Additional copies of our Medication Agreement are available upon request.) 9. Laws, Rules, & Regulations: All patients are expected to follow all Federal and [employer], [employer], Rules, [employer]. Ignorance of the Laws does not constitute a valid excuse. The use of any illegal substances is prohibited. 10. Adopted CDC guidelines & recommendations: Target dosing levels will be at or below 60 MME/day. Use of benzodiazepines** is not recommended.  Exceptions: There are only two exceptions to the rule of not receiving pain medications from other Healthcare Providers. 1. Exception #1  (Emergencies): In the event of an emergency (i.e.: accident requiring emergency care), you are allowed to receive additional pain medication. However, you are responsible for: As soon as you are able, call our office [phone], at any time of the day or night, and leave a message stating your name, the date and nature of the emergency, and the name and dose of the medication prescribed. In the event that your call is answered by a member of our staff, make sure to document and save the date, time, and the name of the person that took your information.  2. Exception #2 (Planned Surgery): In the event that you are scheduled by another doctor or dentist to have any type of surgery or procedure, you are allowed (for a period no longer than 30 days), to receive additional pain medication, for the acute post-op pain. However, in this case, you are responsible for picking up a copy of our "Post-op Pain Management for Surgeons" handout, and giving it to your surgeon or dentist. This document is available at our office, and does not require an appointment to obtain it. Simply go to our office during  business hours (Monday-Thursday from 8:00 AM to 4:00 PM) (Friday 8:00 AM to 12:00 Noon) or if you have a scheduled appointment with [place], prior to your surgery, and ask for it by name. In addition, you will need to provide [place] with your name, name of your surgeon, type of surgery, and date of procedure or surgery.  *Opioid medications include: morphine, codeine, oxycodone, oxymorphone, hydrocodone, hydromorphone, meperidine, tramadol, tapentadol, buprenorphine, fentanyl, methadone. **Benzodiazepine medications include: diazepam (Valium), alprazolam (Xanax), clonazepam (Klonopine), lorazepam (Ativan), clorazepate (Tranxene), chlordiazepoxide (Librium), estazolam (Prosom), oxazepam (Serax), temazepam (Restoril), triazolam (Halcion) (Last updated:  03/08/2017) ____________________________________________________________________________________________   BMI Assessment: Estimated body mass index is 34.61 kg/m as calculated from the following:   Height as of this encounter: '5\' 5"'$  (1.651 m).   Weight as of this encounter: 208 lb (94.3 kg).  BMI interpretation table: BMI level Category Range association with higher incidence of chronic pain  <18 kg/m2 Underweight   18.5-24.9 kg/m2 Ideal body weight   25-29.9 kg/m2 Overweight Increased incidence by 20%  30-34.9 kg/m2 Obese (Class I) Increased incidence by 68%  35-39.9 kg/m2 Severe obesity (Class II) Increased incidence by 136%  >40 kg/m2 Extreme obesity (Class III) Increased incidence by 254%   Patient's current BMI Ideal Body weight  Body mass index is 34.61 kg/m. Ideal body weight: 57 kg (125 lb 10.6 oz) Adjusted ideal body weight: 71.9 kg (158 lb 9.6 oz)   BMI Readings from Last 4 Encounters:  02/06/18 34.61 kg/m  11/28/17 32.10 kg/m  11/19/17 34.45 kg/m  11/06/17 33.61 kg/m   Wt Readings from Last 4 Encounters:  02/06/18 208 lb (94.3 kg)  11/28/17 197 lb 6.4 oz (89.5 kg)  11/19/17 207 lb (93.9 kg)  11/06/17 202 lb (91.6 kg)

## 2018-02-06 NOTE — Patient Instructions (Addendum)
____________________________________________________________________________________________  Medication Rules  Purpose: To inform patients, and their family members, of our rules and regulations.  Applies to: All patients receiving prescriptions (written or electronic).  Pharmacy of record: Pharmacy where electronic prescriptions will be sent. If written prescriptions are taken to a different pharmacy, please inform the nursing staff. The pharmacy listed in the electronic medical record should be the one where you would like electronic prescriptions to be sent.  Electronic prescriptions: In compliance with the [place] Strengthen Opioid Misuse Prevention (STOP) Act of 2017 (Session Law 2017-74/H243), effective January 09, 2018, all controlled substances must be electronically prescribed. Calling prescriptions to the pharmacy will cease to exist.  Prescription refills: Only during scheduled appointments. Applies to all prescriptions.  NOTE: The following applies primarily to controlled substances (Opioid* Pain Medications).   Patient's responsibilities: 1. Pain Pills: Bring all pain pills to every appointment (except for procedure appointments). 2. Pill Bottles: Bring pills in original pharmacy bottle. Always bring the newest bottle. Bring bottle, even if empty. 3. Medication refills: You are responsible for knowing and keeping track of what medications you take and those you need refilled. The day before your appointment: write a list of all prescriptions that need to be refilled. The day of the appointment: give the list to the admitting nurse. Prescriptions will be written only during appointments. If you forget a medication: it will not be "Called in", "Faxed", or "electronically sent". You will need to get another appointment to get these prescribed. No early refills. Do not call asking to have your prescription filled early. 4. Prescription Accuracy: You are responsible for  carefully inspecting your prescriptions before leaving our office. Have the discharge nurse carefully go over each prescription with you, before taking them home. Make sure that your name is accurately spelled, that your address is correct. Check the name and dose of your medication to make sure it is accurate. Check the number of pills, and the written instructions to make sure they are clear and accurate. Make sure that you are given enough medication to last until your next medication refill appointment. 5. Taking Medication: Take medication as prescribed. When it comes to controlled substances, taking less pills or less frequently than prescribed is permitted and encouraged. Never take more pills than instructed. Never take medication more frequently than prescribed.  6. Inform other Doctors: Always inform, all of your healthcare providers, of all the medications you take. 7. Pain Medication from other Providers: You are not allowed to accept any additional pain medication from any other Doctor or Healthcare provider. There are two exceptions to this rule. (see below) In the event that you require additional pain medication, you are responsible for notifying us, as stated below. 8. Medication Agreement: You are responsible for carefully reading and following our Medication Agreement. This must be signed before receiving any prescriptions from our practice. Safely store a copy of your signed Agreement. Violations to the Agreement will result in no further prescriptions. (Additional copies of our Medication Agreement are available upon request.) 9. Laws, Rules, & Regulations: All patients are expected to follow all Federal and State Laws, Statutes, Rules, & Regulations. Ignorance of the Laws does not constitute a valid excuse. The use of any illegal substances is prohibited. 10. Adopted CDC guidelines & recommendations: Target dosing levels will be at or below 60 MME/day. Use of benzodiazepines** is not  recommended.  Exceptions: There are only two exceptions to the rule of not receiving pain medications from other Healthcare Providers. 1.   Exception #1 (Emergencies): In the event of an emergency (i.e.: accident requiring emergency care), you are allowed to receive additional pain medication. However, you are responsible for: As soon as you are able, call our office [phone], at any time of the day or night, and leave a message stating your name, the date and nature of the emergency, and the name and dose of the medication prescribed. In the event that your call is answered by a member of our staff, make sure to document and save the date, time, and the name of the person that took your information.  2. Exception #2 (Planned Surgery): In the event that you are scheduled by another doctor or dentist to have any type of surgery or procedure, you are allowed (for a period no longer than 30 days), to receive additional pain medication, for the acute post-op pain. However, in this case, you are responsible for picking up a copy of our "Post-op Pain Management for Surgeons" handout, and giving it to your surgeon or dentist. This document is available at our office, and does not require an appointment to obtain it. Simply go to our office during business hours (Monday-Thursday from 8:00 AM to 4:00 PM) (Friday 8:00 AM to 12:00 Noon) or if you have a scheduled appointment with [place], prior to your surgery, and ask for it by name. In addition, you will need to provide [place] with your name, name of your surgeon, type of surgery, and date of procedure or surgery.  *Opioid medications include: morphine, codeine, oxycodone, oxymorphone, hydrocodone, hydromorphone, meperidine, tramadol, tapentadol, buprenorphine, fentanyl, methadone. **Benzodiazepine medications include: diazepam (Valium), alprazolam (Xanax), clonazepam (Klonopine), lorazepam (Ativan), clorazepate (Tranxene), chlordiazepoxide (Librium), estazolam (Prosom),  oxazepam (Serax), temazepam (Restoril), triazolam (Halcion) (Last updated: 03/08/2017) ____________________________________________________________________________________________   BMI Assessment: Estimated body mass index is 34.61 kg/m as calculated from the following:   Height as of this encounter: 5\' 5"  (1.651 m).   Weight as of this encounter: 208 lb (94.3 kg).  BMI interpretation table: BMI level Category Range association with higher incidence of chronic pain  <18 kg/m2 Underweight   18.5-24.9 kg/m2 Ideal body weight   25-29.9 kg/m2 Overweight Increased incidence by 20%  30-34.9 kg/m2 Obese (Class I) Increased incidence by 68%  35-39.9 kg/m2 Severe obesity (Class II) Increased incidence by 136%  >40 kg/m2 Extreme obesity (Class III) Increased incidence by 254%   Patient's current BMI Ideal Body weight  Body mass index is 34.61 kg/m. Ideal body weight: 57 kg (125 lb 10.6 oz) Adjusted ideal body weight: 71.9 kg (158 lb 9.6 oz)   BMI Readings from Last 4 Encounters:  02/06/18 34.61 kg/m  11/28/17 32.10 kg/m  11/19/17 34.45 kg/m  11/06/17 33.61 kg/m   Wt Readings from Last 4 Encounters:  02/06/18 208 lb (94.3 kg)  11/28/17 197 lb 6.4 oz (89.5 kg)  11/19/17 207 lb (93.9 kg)  11/06/17 202 lb (91.6 kg)

## 2018-02-07 ENCOUNTER — Encounter: Payer: Medicare Other | Admitting: Nurse Practitioner

## 2018-02-08 ENCOUNTER — Ambulatory Visit: Payer: Medicare Other

## 2018-02-08 VITALS — BP 114/83 | HR 86

## 2018-02-08 DIAGNOSIS — R2689 Other abnormalities of gait and mobility: Secondary | ICD-10-CM

## 2018-02-08 NOTE — Therapy (Signed)
[facility] [address] [place], [place], [address] Phone: [phone]   Fax:  [phone]  Physical Therapy Treatment  Patient Details  Name: Rachael West MRN: [id number] Date of Birth: [birth year] Referring Provider (PT): Mina Marble, NP   Encounter Date: 02/08/2018  PT End of Session - 02/08/18 0263    Visit Number  5   no charge today-pt sick   Number of Visits  9    Date for PT Re-Evaluation  --   original POC 01/12/18 but pt missed 3 weeks 2/2 transportation issues and her mother was ill   Authorization Type  [employer] Medicare    PT Start Time  [phone]    PT Stop Time  0818    PT Time Calculation (min)  10 min       Past Medical History:  Diagnosis Date  . Anemia   . Anxiety   . Arthritis   . Asthma   . Depression   . GERD (gastroesophageal reflux disease)   . Hepatitis-C   . History of chemotherapy   . Hypertension   . Insomnia   . 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 Personal history of non-Hodgkin lymphomas   . Vitamin D deficiency     Past Surgical History:  Procedure Laterality Date  . ABDOMINAL HYSTERECTOMY    . APPENDECTOMY    . BREAST BIOPSY Left 2015   lymphoma  . COLONOSCOPY  01/17/2011   Procedure: COLONOSCOPY;  Surgeon: Winfield Cunas., MD;  Location: [facility];  Service: Endoscopy;  Laterality: N/A;  . ESOPHAGOGASTRODUODENOSCOPY  01/17/2011   Procedure: ESOPHAGOGASTRODUODENOSCOPY (EGD);  Surgeon: Winfield Cunas., MD;  Location: [facility];  Service: Endoscopy;  Laterality: N/A;  . TRIGGER FINGER RELEASE      Vitals:   02/08/18 0810  BP: 114/83  Pulse: 86  SpO2: 99%    Subjective Assessment - 02/08/18 0810    Subjective  Pt denied falls since last visit. Pt has been sick for about a week, she states she's congested and is having difficulty swallowing. Pt states "I did not think about exercising today".     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     Currently in Pain?  Yes    Pain Score  6     Pain Location  Throat    Pain Orientation  Right    Pain Descriptors / Indicators  Aching    Pain Type  Acute pain    Pain Onset  In the past 7 days    Pain Frequency  Constant    Aggravating Factors   swallowing    Pain Relieving Factors  drinking tea and popsicles     Multiple Pain Sites  Yes    Pain Score  5    Pain Location  Back    Pain Orientation  Mid;Lower   along spine   Pain Descriptors / Indicators  Aching    Pain Type  Chronic pain    Pain Onset  More than a month ago    Pain Frequency  Constant    Aggravating Factors   certain movements (turning)    Pain Relieving Factors  stretches, medication  PT Short Term Goals - 12/13/17 1609      PT SHORT TERM GOAL #1   Title  same as LTGs        PT Long Term Goals - 01/24/18 1150      PT LONG TERM GOAL #1   Title  Pt will be IND in HEP to improve the deficits listed above. TARGET DATE FOR ALL LTGS: 01/10/18 (ALL UNMET GOALS WILL BE CARRIED OVER TO NEW POC: 02/08/18    Status  On-going      PT LONG TERM GOAL #2   Title  Pt will improve DGI score to >/=20/24 to decr. falls risk.     Baseline  13/20 ON 01/24/18    Status  Not Met      PT LONG TERM GOAL #3   Title  Discuss aquatic PT and write goal as indicated.     Status  Not Met      PT LONG TERM GOAL #4   Title  Pt will amb. 600' over even/uneven terrain, IND, without LOB to improve functional mobility.     Status  Not Met      PT LONG TERM GOAL #5   Title  Pt will traverse 15 steps with one handrail at MOD I level to safely traverse apartment stairs.     Status  Not Met            Plan - 02/08/18 0824    Clinical Impression  Statement  No charge for today's visit, as pt is sick and does not feel able to exercise today. PT encouraged pt to call MD re: illness as she's been sick for at least a week and not improving.     PT Next Visit Plan  Reassess and renew. Try starting with [employer] for flexibility/warm up and review HEP prn. Write aquatic goal once aquatic PT can schedule pt. High level balance, perform vestibular exam (check orthostatics).       Patient will benefit from skilled therapeutic intervention in order to improve the following deficits and impairments:     Visit Diagnosis: Other abnormalities of gait and mobility     Problem List Patient Active Problem List   Diagnosis Date Noted  . Gait disturbance 11/28/2017  . Cervical facet syndrome (Bilateral) (R>L) 10/24/2017  . Spondylosis without myelopathy or radiculopathy, cervical region 10/24/2017  . Latex precautions, history of latex allergy 10/09/2017  . Elevated uric acid in blood 09/27/2017  . Chronic idiopathic gout 09/27/2017  . DDD (degenerative disc disease), cervical 09/27/2017  . 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 Chronic shoulder pain (Bilateral) (R>L) 09/26/2017  . Chronic hip pain (Bilateral) (L>R) 09/26/2017  . Cervicalgia 09/26/2017  . DDD (degenerative disc disease), lumbar 09/25/2017  . Lumbar facet arthropathy 09/25/2017  . Lumbar facet syndrome (Bilateral) (L>R) 09/25/2017  . Spondylosis without myelopathy or radiculopathy, lumbar region 09/25/2017  . DDD (degenerative disc disease), thoracic 09/25/2017  . Thoracolumbar dextroscoliosis 09/25/2017  . Vitamin D deficiency 09/25/2017  . Cervicogenic headache 09/25/2017  . Other dysphagia 09/24/2017  . Tendinitis of left rotator cuff 09/24/2017  . Tendinitis of right rotator cuff 09/24/2017  . Fibromyalgia 09/24/2017  . Elevated C-reactive protein (CRP) 09/03/2017  . Elevated sed rate 09/03/2017  . Chronic low back pain (Primary Area of  Pain) (Bilateral) w/ sciatica (Left) 08/29/2017  . Chronic lower extremity pain (Secondary Area of Pain) (Left) 08/29/2017  . Chronic upper back pain [facility] Area of Pain) (Bilateral) (L>R)  08/29/2017  . Chronic neck pain (Fourth Area of Pain) (Bilateral) (L>R) 08/29/2017  . Occipital headache 08/29/2017  . Chronic knee pain (Bilateral) (R>L) 08/29/2017  . Chronic pain syndrome 08/29/2017  . Pharmacologic therapy 08/29/2017  . Long term current use of opiate analgesic 08/29/2017  . Disorder of skeletal system 08/29/2017  . Problems influencing health status 08/29/2017  . Chronic sacroiliac joint pain 08/29/2017  . Hyperlipidemia 07/31/2017  . GERD (gastroesophageal reflux disease) 05/01/2017  . Anxiety 05/01/2017  . Depression 05/01/2017  . Healthcare maintenance 05/01/2017  . Chronic idiopathic constipation 05/01/2017  . Dyspnea 10/22/2014  . Obesity (BMI 30-39.9) 10/22/2014  . Obesity 09/12/2014  . Essential hypertension 09/12/2014  . Pneumonia, lobar ([place]) 09/11/2014  . Intermittent asthma 09/11/2014  . Cirrhosis ([place]) 09/08/2014  . Chronic hepatitis C without hepatic coma ([place]) 01/15/2014  . 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 Constipation 04/24/2012  . Anemia 05/25/2011  . Atypical chest pain 05/25/2011  . History of thyroid disease 05/25/2011  . Hepatitis C 05/25/2011  . Benign hypertension 05/25/2011  . Gastritis 05/25/2011  . Asthma 05/25/2011  . Stress incontinence 02/10/2011  . Urge incontinence 02/10/2011  . Osteoarthrosis involving multiple sites 10/17/2010    Tobey Lippard L 02/08/2018, 8:25 AM  [facility] [address] [place], [place], [address] Phone: [phone]   Fax:  [phone]  Name: SHIRLEAN BERMAN MRN: [id number] Date of Birth: Jan 30, 1955  Geoffry Paradise, PT,DPT 02/08/18 8:25 AM Phone: [phone] Fax: [phone]

## 2018-02-10 LAB — TOXASSURE SELECT 13 (MW), URINE

## 2018-02-12 ENCOUNTER — Ambulatory Visit: Payer: Medicare Other | Admitting: Physical Therapy

## 2018-02-14 ENCOUNTER — Ambulatory Visit: Payer: Medicare Other

## 2018-02-19 ENCOUNTER — Encounter: Payer: Self-pay | Admitting: Physical Therapy

## 2018-02-19 ENCOUNTER — Ambulatory Visit: Payer: Medicare Other | Attending: Adult Health | Admitting: Physical Therapy

## 2018-02-19 DIAGNOSIS — R2689 Other abnormalities of gait and mobility: Secondary | ICD-10-CM | POA: Diagnosis present

## 2018-02-19 DIAGNOSIS — M6281 Muscle weakness (generalized): Secondary | ICD-10-CM | POA: Insufficient documentation

## 2018-02-19 DIAGNOSIS — R2681 Unsteadiness on feet: Secondary | ICD-10-CM | POA: Insufficient documentation

## 2018-02-19 DIAGNOSIS — R42 Dizziness and giddiness: Secondary | ICD-10-CM | POA: Diagnosis present

## 2018-02-19 NOTE — Therapy (Signed)
[facility] [address] [place], [place], [address] Phone: [phone]   Fax:  [phone]  Physical Therapy Treatment  Patient Details  Name: Rachael West MRN: [id number] Date of Birth: 03-27-1955 Referring Provider (PT): Mina Marble, NP   Encounter Date: 02/19/2018  PT End of Session - 02/19/18 1315    Visit Number  6    Number of Visits  13   re-certification 3/73/42   Date for PT Re-Evaluation  03/21/18    Authorization Type  [employer] Medicare    Authorization Time Period  02/19/18 to 04/20/2018    PT Start Time  1315    PT Stop Time  1400    PT Time Calculation (min)  45 min    Equipment Utilized During Treatment  Gait belt    Activity Tolerance  Patient tolerated treatment well    Behavior During Therapy  [facility] for tasks assessed/performed       Past Medical History:  Diagnosis Date  . Anemia   . Anxiety   . Arthritis   . Asthma   . Depression   . GERD (gastroesophageal reflux disease)   . Hepatitis-C   . History of chemotherapy   . Hypertension   . Insomnia   . Lymphoma ([place]) 12/2013   aggressive lymphoma  . Personal history of non-Hodgkin lymphomas   . Vitamin D deficiency     Past Surgical History:  Procedure Laterality Date  . ABDOMINAL HYSTERECTOMY    . APPENDECTOMY    . BREAST BIOPSY Left 2015   lymphoma  . COLONOSCOPY  01/17/2011   Procedure: COLONOSCOPY;  Surgeon: Winfield Cunas., MD;  Location: [facility];  Service: Endoscopy;  Laterality: N/A;  . ESOPHAGOGASTRODUODENOSCOPY  01/17/2011   Procedure: ESOPHAGOGASTRODUODENOSCOPY (EGD);  Surgeon: Winfield Cunas., MD;  Location: [facility];  Service: Endoscopy;  Laterality: N/A;  . TRIGGER FINGER RELEASE      There were no vitals filed for this visit.  Subjective Assessment - 02/19/18 1315    Subjective  Denies falls. Uses walking stick when in community, not in the house. Currently staying at her mom's due to mom has been  ill.     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     Currently in Pain?  Yes    Pain Score  3     Pain Location  Back    Pain Orientation  Lower    Pain Descriptors / Indicators  Aching    Pain Type  Chronic pain    Pain Radiating Towards  low back into left hip    Pain Onset  More than a month ago    Pain Frequency  Intermittent    Aggravating Factors   not sure; I was told my tailbone curves to the side    Pain Relieving Factors  walking, but not too fast    Pain Onset  More than a month ago                       [facility] Adult PT Treatment/Exercise - 02/19/18 1406      Ambulation/Gait   Ambulation/Gait Assistance  6: Modified independent (Device/Increase time)    Ambulation/Gait Assistance Details  used clinic walking stick with rubber tip (her  wooden stick does not have a rubber tip and slides)    Ambulation Distance (Feet)  600 Feet    Assistive device  Other (Comment)   walking stick   Gait Pattern  Step-through pattern;Decreased arm swing - right;Decreased arm swing - left;Decreased stride length;Decreased dorsiflexion - right;Decreased dorsiflexion - left;Trendelenburg;Lateral trunk lean to left    Ambulation Surface  Level;Unlevel;Indoor   raining therefore indoors  only   Stairs  Yes    Stairs Assistance  6: Modified independent (Device/Increase time)    Stairs Assistance Details (indicate cue type and reason)  protective of her right knee (reports it gives out sometimes)    Stair Management Technique  Two rails;Forwards;Alternating pattern;Step to pattern   she spontaneously began alternating with no rt knee issues   Number of Stairs  16    Height of Stairs  6    Ramp  6: Modified independent (Device)    Curb  6: Modified  independent (Device/increase time)    Gait Comments  pt reports dizziness and requests time to recover when multiple turns made during DGI      Posture/Postural Control   Posture/Postural Control  Postural limitations    Postural Limitations  Rounded Shoulders;Forward head    Posture Comments  can correct with cues      Dynamic Gait Index   Level Surface  Mild Impairment    Change in Gait Speed  Mild Impairment    Gait with Horizontal Head Turns  Mild Impairment    Gait with Vertical Head Turns  Mild Impairment    Gait and Pivot Turn  Mild Impairment    Step Over Obstacle  Moderate Impairment    Step Around Obstacles  Normal    Steps  Moderate Impairment    Total Score  15      Knee/Hip Exercises: Aerobic   Other Aerobic  Scifit: all 4 extremities at RPM 40-60 performed for warm-up and then to improve strength, endurance x 10 minutes.       Knee/Hip Exercises: Sidelying   Clams  15 reps LLE             PT Education - 02/19/18 1413    Education Details  importance of doing her HEP and attending appts if she wants to improve    Person(s) Educated  Patient    Methods  Explanation    Comprehension  Verbalized understanding       PT Short Term Goals - 12/13/17 1609      PT SHORT TERM GOAL #1   Title  same as LTGs        PT Long Term Goals - 02/19/18 1414      PT LONG TERM GOAL #1   Title  Pt will be IND in HEP to improve the deficits listed above. TARGET DATE FOR ALL LTGS: 01/10/18 (ALL UNMET GOALS WILL BE CARRIED OVER TO NEW POC: 02/08/18    Baseline  02/19/18 Patient reports she has been doing "some exercises" but not as often as she should    Status  Partially Met      PT LONG TERM GOAL #2   Title  Pt will improve DGI score to >/=20/24 to decr. falls risk.     Baseline  13/20 ON 01/24/18; 02/19/18  15/24 (<19 indicates incr fall risk)    Status  Partially Met      PT LONG TERM GOAL #3   Title  Discuss aquatic PT and write goal as indicated.  Baseline  02/19/18  Pt's poor attendance (due to illness and transportation issues) has prohibited scheduling for aquatics.     Status  Deferred      PT LONG TERM GOAL #4   Title  Pt will amb. 600' over even/uneven terrain, IND, without LOB to improve functional mobility.     Baseline  02/19/18 modified independent with walking stick    Status  Partially Met      PT LONG TERM GOAL #5   Title  Pt will traverse 15 steps with one handrail at MOD I level to safely traverse apartment stairs.     Status  Achieved        NEW GOALS PT Long Term Goals - 02/19/18 1437      PT LONG TERM GOAL #1   Title  Pt will be IND in updated HEP and able to verbalize a plan for continued exercise upon discharge from PT. (TARGET DATE LTGS: 03/21/18)    Time  4    Period  Weeks    Status  New      PT LONG TERM GOAL #2   Title  Pt will improve DGI score to >/=20/24 to decr. falls risk.     Baseline  13/20 ON 01/24/18; 02/19/18  15/24 (<19 indicates incr fall risk)    Time  4    Period  Weeks    Status  On-going      PT LONG TERM GOAL #3   Title  Patient will ambulate over paved surfaces with curbs and inclines with walking stick modified independently x 1000 ft.     Time  4    Period  Weeks    Status  Revised          Plan - 02/19/18 1418    Clinical Impression Statement  Patient returns after prolonged absence due to her illness and transportation issues. We discussed if moving forward with PT she needs to have reliable transportation. She states she saw a new bus stop out front of our clinic and plans to take the bus. LTGs assessed with pt meeting 1 of 5 goals, partially meeting 3 goals (progress made, but not to goal level), and 5th goal was deferred as pt's attendance does not support referral to aquatics at this time. Patient continues to be at risk of falls and can benefit from PT if she is able to attend consistently.     History and Personal Factors relevant to plan of care:  PMH-HTN, asthma, hepatitis C,  cirrhosis, LBP, DDD, spondylosis Cx spine, Cx forminal stenosis (C5-6, C6-7), anxiety/depression, HA, fibromyalgia, anemia, hx of lymphoma (2015), HLD, Latex allergy ; Personal factors-lack of support;     Rehab Potential  Good    PT Frequency  1x / week    PT Duration  4 weeks    PT Treatment/Interventions  ADLs/Self Care Home Management;Therapeutic exercise;Therapeutic activities;Manual techniques;Vestibular;Functional mobility training;Stair training;Gait training;DME Instruction;Patient/family education;Neuromuscular re-education;Balance training;Passive range of motion    PT Next Visit Plan  starting with SciFit for flexibility/warm up; Walking without walking stick, focus on not leaning shoulders to left due to left hip abdct weakness; left hip abdct strengthening; High level balance, monitor dizziness and assess if needed    Consulted and Agree with Plan of Care  Patient       Patient will benefit from skilled therapeutic intervention in order to improve the following deficits and impairments:  Abnormal gait, Decreased endurance, Decreased knowledge of use of DME, Decreased strength,  Decreased balance, Pain, Impaired flexibility, Postural dysfunction, Dizziness, Obesity  Visit Diagnosis: Muscle weakness (generalized)  Unsteadiness on feet  Other abnormalities of gait and mobility  Dizziness and giddiness     Problem List Patient Active Problem List   Diagnosis Date Noted  . Gait disturbance 11/28/2017  . Cervical facet syndrome (Bilateral) (R>L) 10/24/2017  . Spondylosis without myelopathy or radiculopathy, cervical region 10/24/2017  . Latex precautions, history of latex allergy 10/09/2017  . Elevated uric acid in blood 09/27/2017  . Chronic idiopathic gout 09/27/2017  . DDD (degenerative disc disease), cervical 09/27/2017  . 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 Chronic shoulder pain (Bilateral) (R>L) 09/26/2017  . Chronic hip pain  (Bilateral) (L>R) 09/26/2017  . Cervicalgia 09/26/2017  . DDD (degenerative disc disease), lumbar 09/25/2017  . Lumbar facet arthropathy 09/25/2017  . Lumbar facet syndrome (Bilateral) (L>R) 09/25/2017  . Spondylosis without myelopathy or radiculopathy, lumbar region 09/25/2017  . DDD (degenerative disc disease), thoracic 09/25/2017  . Thoracolumbar dextroscoliosis 09/25/2017  . Vitamin D deficiency 09/25/2017  . Cervicogenic headache 09/25/2017  . Other dysphagia 09/24/2017  . Tendinitis of left rotator cuff 09/24/2017  . Tendinitis of right rotator cuff 09/24/2017  . Fibromyalgia 09/24/2017  . Elevated C-reactive protein (CRP) 09/03/2017  . Elevated sed rate 09/03/2017  . Chronic low back pain (Primary Area of Pain) (Bilateral) w/ sciatica (Left) 08/29/2017  . Chronic lower extremity pain (Secondary Area of Pain) (Left) 08/29/2017  . Chronic upper back pain [facility] Area of Pain) (Bilateral) (L>R) 08/29/2017  . Chronic neck pain (Fourth Area of Pain) (Bilateral) (L>R) 08/29/2017  . Occipital headache 08/29/2017  . Chronic knee pain (Bilateral) (R>L) 08/29/2017  . Chronic pain syndrome 08/29/2017  . Pharmacologic therapy 08/29/2017  . Long term current use of opiate analgesic 08/29/2017  . Disorder of skeletal system 08/29/2017  . Problems influencing health status 08/29/2017  . Chronic sacroiliac joint pain 08/29/2017  . Hyperlipidemia 07/31/2017  . GERD (gastroesophageal reflux disease) 05/01/2017  . Anxiety 05/01/2017  . Depression 05/01/2017  . Healthcare maintenance 05/01/2017  . Chronic idiopathic constipation 05/01/2017  . Dyspnea 10/22/2014  . Obesity (BMI 30-39.9) 10/22/2014  . Obesity 09/12/2014  . Essential hypertension 09/12/2014  . Pneumonia, lobar ([place]) 09/11/2014  . Intermittent asthma 09/11/2014  . Cirrhosis ([place]) 09/08/2014  . Chronic hepatitis C without hepatic coma ([place]) 01/15/2014  . 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 Constipation 04/24/2012  .  Anemia 05/25/2011  . Atypical chest pain 05/25/2011  . History of thyroid disease 05/25/2011  . Hepatitis C 05/25/2011  . Benign hypertension 05/25/2011  . Gastritis 05/25/2011  . Asthma 05/25/2011  . Stress incontinence 02/10/2011  . Urge incontinence 02/10/2011  . Osteoarthrosis involving multiple sites 10/17/2010    Rexanne Mano , PT 02/19/2018, 2:37 PM  [facility] [address] [place], [place], [address] Phone: [phone]   Fax:  [phone]  Name: Rachael West MRN: [id number] Date of Birth: [birth year]

## 2018-02-21 ENCOUNTER — Ambulatory Visit: Payer: Medicare Other | Admitting: Physical Therapy

## 2018-02-26 ENCOUNTER — Ambulatory Visit: Payer: Medicare Other | Admitting: Physical Therapy

## 2018-03-08 ENCOUNTER — Encounter

## 2018-03-14 ENCOUNTER — Encounter: Payer: Self-pay | Admitting: Physical Therapy

## 2018-03-14 ENCOUNTER — Ambulatory Visit: Payer: Medicare Other | Attending: Adult Health | Admitting: Physical Therapy

## 2018-03-19 ENCOUNTER — Ambulatory Visit: Payer: Medicare Other | Admitting: Physical Therapy

## 2018-03-20 ENCOUNTER — Telehealth: Payer: Self-pay

## 2018-03-20 NOTE — Telephone Encounter (Signed)
Levada Dy with [facility] member services called with pt on conference call for advice.  Pt is c/o bilateral LLE, pain, and hot to touch x 2 weeks.  Pt states that a child stepped on one foot prior to onset of symptoms.  Advised pt and Levada Dy that pt needs to be seen at ED to be evaluated for possible DVT(s), CHF, etc.  Pt expressed understanding.  Charyl Bigger, CMA

## 2018-03-23 ENCOUNTER — Other Ambulatory Visit: Payer: Self-pay | Admitting: Adult Health

## 2018-04-24 NOTE — Progress Notes (Signed)
Virtual Visit via Telephone Note  I connected with Rachael West on 04/24/18 at  9:00 AM EDT by telephone and verified that I am speaking with the correct person using two identifiers.   I discussed the limitations, risks, security and privacy concerns of performing an evaluation and management service by telephone and the availability of in person appointments. The staff discussed with the patient that there may be a patient responsible charge related to this service. The patient expressed understanding and agreed to proceed.   History of Present Illness: 11/28/2017 OV: Rachael West is here for CPE She reports medication compliance, denies SE Regular f/u by Pain Med, Oncology, Rheumatology, Psychiatry She attends weekly "group meetings for my PTSD" She denies thoughts of harming herself/others She reports falling when rising too quickly or walking too quickly around turns. She ambulates with cane. She reports that her Rheumatologist recently dx'd her with Fibromyalgia Last course of PT was May 2019, she is agreeable to another course. 04/29/2018 OV: Rachael West calls in today for regular f/u: HTN, GERD, Depression, Anxiety, Dyspnea She reports home readings SBP 110-150 DBP 70-80 Currently- 126/81She denies acute cardiac sx's She reports using the neb 1-2 times/day and feels that her dyspnea is stable- not increasing from her baseline She denies fever She has remained home and has only been out to procure groceries the last 4 weeks and will don mask/gloves when out in public. She has appt with Rheumatologist at end of this month Her "Group Therapy" for PTSD has been cancelled due to COVID-19 pandemic, however [facility] is offering one/one counseling at no additional charge.  She hanot taken advantage of this program- encouraged to call [facility] clinic to set up appt ASAP She reports emotional liability, however vehemently denies SI/HI She reports medication compliance on Sertraline  200mg  QD, Buspar 5mg  2-3 times/day She denies any ETOH use She completed formal course of PT She denies regular exercise She is currently staying with her elderly mother  Patient Care Team    Relationship Specialty Notifications Start End  Rachael West D, NP PCP - General Family Medicine  03/21/17   Rachael Spates, MD Consulting Physician Gastroenterology  12/01/14   Rachael Stabler, MD Consulting Physician Gastroenterology  05/01/17   Rachael Saas, MD Consulting Physician Orthopedic Surgery  05/01/17     Patient Active Problem List   Diagnosis Date Noted  . Gait disturbance 11/28/2017  . Cervical facet syndrome (Bilateral) (R>L) 10/24/2017  . Spondylosis without myelopathy or radiculopathy, cervical region 10/24/2017  . Latex precautions, history of latex allergy 10/09/2017  . Elevated uric acid in blood 09/27/2017  . Chronic idiopathic gout 09/27/2017  . DDD (degenerative disc disease), cervical 09/27/2017  . 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 Chronic shoulder pain (Bilateral) (R>L) 09/26/2017  . Chronic hip pain (Bilateral) (L>R) 09/26/2017  . Cervicalgia 09/26/2017  . DDD (degenerative disc disease), lumbar 09/25/2017  . Lumbar facet arthropathy 09/25/2017  . Lumbar facet syndrome (Bilateral) (L>R) 09/25/2017  . Spondylosis without myelopathy or radiculopathy, lumbar region 09/25/2017  . DDD (degenerative disc disease), thoracic 09/25/2017  . Thoracolumbar dextroscoliosis 09/25/2017  . Vitamin D deficiency 09/25/2017  . Cervicogenic headache 09/25/2017  . Other dysphagia 09/24/2017  . Tendinitis of left rotator cuff 09/24/2017  . Tendinitis of right rotator cuff 09/24/2017  . Fibromyalgia 09/24/2017  . Elevated C-reactive protein (CRP) 09/03/2017  . Elevated sed rate 09/03/2017  . Chronic low back pain (Primary Area of Pain) (Bilateral) w/ sciatica (Left)  08/29/2017  . Chronic lower extremity pain (Secondary Area of Pain) (Left)  08/29/2017  . Chronic upper back pain [facility] Area of Pain) (Bilateral) (L>R) 08/29/2017  . Chronic neck pain (Fourth Area of Pain) (Bilateral) (L>R) 08/29/2017  . Occipital headache 08/29/2017  . Chronic knee pain (Bilateral) (R>L) 08/29/2017  . Chronic pain syndrome 08/29/2017  . Pharmacologic therapy 08/29/2017  . Long term current use of opiate analgesic 08/29/2017  . Disorder of skeletal system 08/29/2017  . Problems influencing health status 08/29/2017  . Chronic sacroiliac joint pain 08/29/2017  . Hyperlipidemia 07/31/2017  . GERD (gastroesophageal reflux disease) 05/01/2017  . Anxiety 05/01/2017  . Depression 05/01/2017  . Healthcare maintenance 05/01/2017  . Chronic idiopathic constipation 05/01/2017  . Dyspnea 10/22/2014  . Obesity (BMI 30-39.9) 10/22/2014  . Obesity 09/12/2014  . Essential hypertension 09/12/2014  . Pneumonia, lobar ([employer]) 09/11/2014  . Intermittent asthma 09/11/2014  . Cirrhosis ([place]) 09/08/2014  . Chronic hepatitis C without hepatic coma ([place]) 01/15/2014  . 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 Constipation 04/24/2012  . Anemia 05/25/2011  . Atypical chest pain 05/25/2011  . History of thyroid disease 05/25/2011  . Hepatitis C 05/25/2011  . Benign hypertension 05/25/2011  . Gastritis 05/25/2011  . Asthma 05/25/2011  . Stress incontinence 02/10/2011  . Urge incontinence 02/10/2011  . Osteoarthrosis involving multiple sites 10/17/2010     Past Medical History:  Diagnosis Date  . Anemia   . Anxiety   . Arthritis   . Asthma   . Depression   . GERD (gastroesophageal reflux disease)   . Hepatitis-C   . History of chemotherapy   . Hypertension   . Insomnia   . Lymphoma ([place]) 12/2013   aggressive lymphoma  . Personal history of non-Hodgkin lymphomas   . Vitamin D deficiency      Past Surgical History:  Procedure Laterality Date  . ABDOMINAL HYSTERECTOMY    . APPENDECTOMY    . BREAST BIOPSY Left 2015   lymphoma  . COLONOSCOPY   01/17/2011   Procedure: COLONOSCOPY;  Surgeon: Winfield Cunas., MD;  Location: [facility];  Service: Endoscopy;  Laterality: N/A;  . ESOPHAGOGASTRODUODENOSCOPY  01/17/2011   Procedure: ESOPHAGOGASTRODUODENOSCOPY (EGD);  Surgeon: Winfield Cunas., MD;  Location: [facility];  Service: Endoscopy;  Laterality: N/A;  . TRIGGER FINGER RELEASE       Family History  Problem Relation Age of Onset  . Asthma Mother   . Allergies Mother   . Asthma Sister   . Heart disease Father   . Heart disease Brother   . Diabetes Maternal Grandmother   . Diabetes Maternal Grandfather   . Heart disease Paternal Grandmother   . Heart disease Paternal Grandfather   . Colon cancer Paternal Aunt   . Liver cancer Paternal Uncle   . Breast cancer Cousin   . Ovarian cancer Other        great aunt (m)  . Pancreatic cancer Other        great aunt (m)     Social History   Substance and Sexual Activity  Drug Use No     Social History   Substance and Sexual Activity  Alcohol Use No  . Alcohol/week: 0.0 standard drinks     Social History   Tobacco Use  Smoking Status Former Smoker  . Packs/day: 1.50  . Years: 20.00  . Pack years: 30.00  . Types: Cigarettes  . Last attempt to quit: 01/09/1990  . Years since quitting: 64.3  Smokeless Tobacco Never Used     Outpatient Encounter Medications as of 04/29/2018  Medication Sig  . atenolol (TENORMIN) 25 MG tablet Take 1 tablet (25 mg total) by mouth daily.  . busPIRone (BUSPAR) 5 MG tablet Take 1 tablet (5 mg total) by mouth 2 (two) times daily. (Patient taking differently: Take 5 mg by mouth 3 (three) times daily. )  . Calcium Carbonate-Vit D-Min (GNP CALCIUM 1200) 1200-1000 MG-UNIT CHEW Chew 1,200 mg by mouth daily with breakfast. Take in combination with vitamin D and magnesium.  . cyclobenzaprine (FLEXERIL) 5 MG tablet Take 1 tablet by mouth at bedtime.  . hydrOXYzine (VISTARIL) 50 MG capsule Take 50 mg by mouth at bedtime.  Marland Kitchen  ipratropium-albuterol (DUONEB) 0.5-2.5 (3) MG/3ML SOLN Inhale 3 mLs into the lungs See admin instructions.  Marland Kitchen latanoprost (XALATAN) 0.005 % ophthalmic solution Place 1 drop into both eyes daily.  Marland Kitchen linaclotide (LINZESS) 72 MCG capsule Take 1 capsule (72 mcg total) by mouth daily before breakfast.  . lisinopril (PRINIVIL,ZESTRIL) 5 MG tablet Take 1 tablet (5 mg total) by mouth daily.  . Magnesium 500 MG CAPS Take 1 capsule (500 mg total) by mouth 2 (two) times daily at 8 am and 10 pm.  . meloxicam (MOBIC) 15 MG tablet Take 15 mg by mouth daily.  . montelukast (SINGULAIR) 10 MG tablet Take 1 tablet (10 mg total) by mouth daily.  Marland Kitchen omeprazole (PRILOSEC) 40 MG capsule 1 CAPSULE BY MOUTH DAILY  . ondansetron (ZOFRAN) 4 MG tablet Take 4 mg by mouth as needed.  . paliperidone (INVEGA) 6 MG 24 hr tablet Take 6 mg by mouth every morning.  . polyethylene glycol (MIRALAX / GLYCOLAX) packet Take 17 g by mouth 2 (two) times daily.  . prazosin (MINIPRESS) 1 MG capsule Take 1 mg by mouth at bedtime.  Marland Kitchen PROAIR HFA 108 (90 Base) MCG/ACT inhaler INHALE 2 PUFFS INTO THE LUNGS 4 (FOUR) TIMES DAILY.  Marland Kitchen sertraline (ZOLOFT) 100 MG tablet Take 200 mg by mouth every morning.  . sertraline (ZOLOFT) 50 MG tablet Take 50 mg by mouth daily.  . traMADol (ULTRAM) 50 MG tablet Take 1 tablet (50 mg total) by mouth every 6 (six) hours as needed for severe pain.  . traZODone (DESYREL) 50 MG tablet 1 TABLET BY MOUTH AT BEDTIME  . VESICARE 10 MG tablet Take 10 mg by mouth daily.   No facility-administered encounter medications on file as of 04/29/2018.     Allergies: Latex; Penicillins; Codeine; Cortisone; and Sulfa antibiotics  There is no height or weight on file to calculate BMI.  There were no vitals taken for this visit.    Observations/Objective: No acute distress noted during the telephone  Assessment and Plan: Continue all medications as directed Remain well hydrated, follow heart healthy diet Increase  exercise/daily stretching, strive for 15mins/day Continue with Rheumatologist as directed Call [facility] and start one/one therapy until Group Therapy resumes Continue to follow Stay at Home order per COVID-19 guidelines  Follow Up Instructions: 4 weeks- fasting labs 3 months OV   I discussed the assessment and treatment plan with the patient. The patient was provided an opportunity to ask questions and all were answered. The patient agreed with the plan and demonstrated an understanding of the instructions.   The patient was advised to call back or seek an in-person evaluation if the symptoms worsen or if the condition fails to improve as anticipated.  I provided 18 minutes of non-face-to-face time during this encounter.  Esaw Grandchild, NP

## 2018-04-29 ENCOUNTER — Other Ambulatory Visit: Payer: Self-pay

## 2018-04-29 ENCOUNTER — Encounter: Payer: Self-pay | Admitting: Adult Health

## 2018-04-29 ENCOUNTER — Ambulatory Visit (INDEPENDENT_AMBULATORY_CARE_PROVIDER_SITE_OTHER): Payer: Medicare Other | Admitting: Adult Health

## 2018-04-29 DIAGNOSIS — F419 Anxiety disorder, unspecified: Secondary | ICD-10-CM

## 2018-04-29 DIAGNOSIS — R0600 Dyspnea, unspecified: Secondary | ICD-10-CM | POA: Diagnosis not present

## 2018-04-29 DIAGNOSIS — I10 Essential (primary) hypertension: Secondary | ICD-10-CM

## 2018-04-29 DIAGNOSIS — Z0000 Encounter for general adult medical examination without abnormal findings: Secondary | ICD-10-CM

## 2018-04-29 DIAGNOSIS — F32A Depression, unspecified: Secondary | ICD-10-CM

## 2018-04-29 DIAGNOSIS — F329 Major depressive disorder, single episode, unspecified: Secondary | ICD-10-CM

## 2018-04-29 MED ORDER — ONDANSETRON HCL 4 MG PO TABS: 4.0000 mg | ORAL_TABLET | ORAL | 0 refills | Status: DC | PRN

## 2018-04-29 NOTE — Assessment & Plan Note (Signed)
Assessment and Plan: Continue all medications as directed Remain well hydrated, follow heart healthy diet Increase exercise/daily stretching, strive for 3mins/day Continue with Rheumatologist as directed Call [facility] and start one/one therapy until Group Therapy resumes Continue to follow Stay at Home order per COVID-19 guidelines  Follow Up Instructions: 4 weeks- fasting labs 3 months OV   I discussed the assessment and treatment plan with the patient. The patient was provided an opportunity to ask questions and all were answered. The patient agreed with the plan and demonstrated an understanding of the instructions.   The patient was advised to call back or seek an in-person evaluation if the symptoms worsen or if the condition fails to improve as anticipated.

## 2018-04-29 NOTE — Assessment & Plan Note (Signed)
She reports home readings SBP 110-150 DBP 70-80 Currently- 126/81She denies acute cardiac sx's Continue Atenolol 25mg  QD, Lisinopril 5mg  QD,

## 2018-04-29 NOTE — Assessment & Plan Note (Signed)
She reports emotional liability, however vehemently denies SI/HI She reports medication compliance on Sertraline 200mg  QD Advised to call [facility] and inquire about one/one therapy since Group Therapy has been temporarily halted due to COVID-19 pandemic

## 2018-04-29 NOTE — Assessment & Plan Note (Signed)
No increase from her baseline Continues to use Neb 1-2 times/day Not currently using tobacco

## 2018-04-29 NOTE — Assessment & Plan Note (Signed)
Controlled with Buspar 5mg  2-3 times/day Denies any ETOH use

## 2018-05-06 ENCOUNTER — Encounter: Payer: Self-pay | Admitting: Adult Health

## 2018-05-06 ENCOUNTER — Ambulatory Visit (INDEPENDENT_AMBULATORY_CARE_PROVIDER_SITE_OTHER): Payer: Medicare Other | Admitting: Adult Health

## 2018-05-06 ENCOUNTER — Other Ambulatory Visit: Payer: Self-pay

## 2018-05-06 VITALS — BP 117/68 | HR 94 | Ht 65.75 in | Wt 209.0 lb

## 2018-05-06 DIAGNOSIS — Z23 Encounter for immunization: Secondary | ICD-10-CM

## 2018-05-06 DIAGNOSIS — Z0000 Encounter for general adult medical examination without abnormal findings: Secondary | ICD-10-CM | POA: Diagnosis not present

## 2018-05-06 MED ORDER — TETANUS-DIPHTH-ACELL PERTUSSIS 5-2.5-18.5 LF-MCG/0.5 IM SUSP: 0.5000 mL | Freq: Once | INTRAMUSCULAR | 0 refills | Status: AC

## 2018-05-06 NOTE — Progress Notes (Addendum)
Virtual Visit via Telephone Note  I connected with Rachael West on 05/06/18 at 10:30 AM EDT by telephone and verified that I am speaking with the correct person using two identifiers.   I discussed the limitations, risks, security and privacy concerns of performing an evaluation and management service by telephone and the availability of in person appointments. The staff discussed with the patient that there may be a patient responsible charge related to this service. The patient expressed understanding and agreed to proceed.  Location of Patient- Home Location of Provider- In Clinic    Subjective:   Rachael West is a 63 y.o. female who presents for Medicare Annual (Subsequent) preventive examination. Review of Systems: General:   No F/C, wt loss Pulm:   No DIB, denies increase in SOB, pleuritic chest pain Card:  No CP, palpitations Abd:  No n/v/d or pain Ext:  No inc edema from baseline   This patient does not have sx concerning for COVID-19 Infection (ie; fever, chills, cough, new or worsening shortness of breath).    Objective:     Vitals: BP 117/68   Pulse 94   Ht 5' 5.75" (1.67 m)   Wt 209 lb (94.8 kg)   BMI 33.99 kg/m   Body mass index is 33.99 kg/m.  Advanced Directives 12/13/2017 09/26/2017 05/01/2017 08/03/2016 02/04/2016 02/04/2015 09/21/2014  Does Patient Have a Medical Advance Directive? No No No Yes No No No  Would patient like information on creating a medical advance directive? Yes (MAU/Ambulatory/Procedural Areas - Information given) No - Patient declined No - Patient declined - - No - patient declined information -    Tobacco Social History   Tobacco Use  Smoking Status Former Smoker  . Packs/day: 1.50  . Years: 20.00  . Pack years: 30.00  . Types: Cigarettes  . Last attempt to quit: 01/09/1990  . Years since quitting: 28.3  Smokeless Tobacco Never Used     Counseling given: Not Answered    Past Medical History:  Diagnosis Date  .  Anemia   . Anxiety   . Arthritis   . Asthma   . Depression   . GERD (gastroesophageal reflux disease)   . Hepatitis-C   . History of chemotherapy   . Hypertension   . Insomnia   . 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 Personal history of non-Hodgkin lymphomas   . Vitamin D deficiency    Past Surgical History:  Procedure Laterality Date  . ABDOMINAL HYSTERECTOMY    . APPENDECTOMY    . BREAST BIOPSY Left 2015   lymphoma  . COLONOSCOPY  01/17/2011   Procedure: COLONOSCOPY;  Surgeon: Winfield Cunas., MD;  Location: [facility];  Service: Endoscopy;  Laterality: N/A;  . ESOPHAGOGASTRODUODENOSCOPY  01/17/2011   Procedure: ESOPHAGOGASTRODUODENOSCOPY (EGD);  Surgeon: Winfield Cunas., MD;  Location: [facility];  Service: Endoscopy;  Laterality: N/A;  . TRIGGER FINGER RELEASE     Family History  Problem Relation Age of Onset  . Asthma Mother   . Allergies Mother   . Asthma Sister   . Heart disease Father   . Heart disease Brother   . Diabetes Maternal Grandmother   . Diabetes Maternal Grandfather   . Heart disease Paternal Grandmother   . Heart disease Paternal Grandfather   . Colon cancer Paternal Aunt   . Liver cancer Paternal Uncle   . Breast cancer Cousin   . Ovarian cancer Other  great aunt (m)  . Pancreatic cancer Other        great aunt (m)   Social History   Socioeconomic History  . Marital status: Divorced    Spouse name: Not on file  . Number of children: Not on file  . Years of education: Not on file  . Highest education level: Not on file  Occupational History  . Occupation: Disabled  Social Needs  . Financial resource strain: Not on file  . Food insecurity:    Worry: Not on file    Inability: Not on file  . Transportation needs:    Medical: Not on file    Non-medical: Not on file  Tobacco Use  . Smoking status: Former Smoker    Packs/day: 1.50    Years: 20.00    Pack years: 30.00    Types: Cigarettes    Last attempt to  quit: 01/09/1990    Years since quitting: 28.3  . Smokeless tobacco: Never Used  Substance and Sexual Activity  . Alcohol use: No    Alcohol/week: 0.0 standard drinks  . Drug use: No  . Sexual activity: Not on file  Lifestyle  . Physical activity:    Days per week: Not on file    Minutes per session: Not on file  . Stress: Not on file  Relationships  . Social connections:    Talks on phone: Not on file    Gets together: Not on file    Attends religious service: Not on file    Active member of club or organization: Not on file    Attends meetings of clubs or organizations: Not on file    Relationship status: Not on file  Other Topics Concern  . Not on file  Social History Narrative  . Not on file    Outpatient Encounter Medications as of 05/06/2018  Medication Sig  . atenolol (TENORMIN) 25 MG tablet Take 1 tablet (25 mg total) by mouth daily.  . busPIRone (BUSPAR) 5 MG tablet Take 1 tablet (5 mg total) by mouth 2 (two) times daily. (Patient taking differently: Take 5 mg by mouth 3 (three) times daily. )  . Calcium Carbonate-Vit D-Min (GNP CALCIUM 1200) 1200-1000 MG-UNIT CHEW Chew 1,200 mg by mouth daily with breakfast. Take in combination with vitamin D and magnesium.  . cyclobenzaprine (FLEXERIL) 5 MG tablet Take 1 tablet by mouth at bedtime.  . hydrOXYzine (VISTARIL) 50 MG capsule Take 50 mg by mouth at bedtime.  Marland Kitchen ipratropium-albuterol (DUONEB) 0.5-2.5 (3) MG/3ML SOLN Inhale 3 mLs into the lungs See admin instructions.  Marland Kitchen latanoprost (XALATAN) 0.005 % ophthalmic solution Place 1 drop into both eyes daily.  Marland Kitchen linaclotide (LINZESS) 72 MCG capsule Take 1 capsule (72 mcg total) by mouth daily before breakfast.  . lisinopril (PRINIVIL,ZESTRIL) 5 MG tablet Take 1 tablet (5 mg total) by mouth daily.  . Magnesium 500 MG CAPS Take 1 capsule (500 mg total) by mouth 2 (two) times daily at 8 am and 10 pm.  . meloxicam (MOBIC) 15 MG tablet Take 15 mg by mouth daily.  . montelukast  (SINGULAIR) 10 MG tablet Take 1 tablet (10 mg total) by mouth daily.  Marland Kitchen omeprazole (PRILOSEC) 40 MG capsule 1 CAPSULE BY MOUTH DAILY  . ondansetron (ZOFRAN) 4 MG tablet Take 1 tablet (4 mg total) by mouth as needed.  . paliperidone (INVEGA) 6 MG 24 hr tablet Take 6 mg by mouth every morning.  . polyethylene glycol (MIRALAX / GLYCOLAX) packet Take 17  g by mouth 2 (two) times daily.  . prazosin (MINIPRESS) 1 MG capsule Take 1 mg by mouth at bedtime.  Marland Kitchen PROAIR HFA 108 (90 Base) MCG/ACT inhaler INHALE 2 PUFFS INTO THE LUNGS 4 (FOUR) TIMES DAILY.  Marland Kitchen sertraline (ZOLOFT) 100 MG tablet Take 200 mg by mouth every morning.  . traMADol (ULTRAM) 50 MG tablet Take 1 tablet (50 mg total) by mouth every 6 (six) hours as needed for severe pain.  . traZODone (DESYREL) 50 MG tablet 1 TABLET BY MOUTH AT BEDTIME  . VESICARE 10 MG tablet Take 10 mg by mouth daily.   No facility-administered encounter medications on file as of 05/06/2018.     Activities of Daily Living In your present state of health, do you have any difficulty performing the following activities: 05/06/2018 11/28/2017  Hearing? Tempie Donning  Vision? Y N  Difficulty concentrating or making decisions? Tempie Donning  Walking or climbing stairs? Y N  Dressing or bathing? N N  Doing errands, shopping? Y N  Some recent data might be hidden    Patient Care Team: Esaw Grandchild, NP as PCP - General (Family Medicine) Laurence Spates, MD as Consulting Physician (Gastroenterology) Loletha Carrow Kirke Corin, MD as Consulting Physician (Gastroenterology) Elsie Saas, MD as Consulting Physician (Orthopedic Surgery)    Assessment:   This is a routine wellness examination for [place].  Exercise Activities and Dietary recommendations  Seated stretching, weight lifting Walks around home/yard daily  Fall Risk Fall Risk  05/06/2018 02/06/2018 11/28/2017 11/19/2017 11/06/2017  Falls in the past year? 1 0 1 0 No  Number falls in past yr: 1 - 1 - -  Comment - - - - -  Injury  with Fall? 0 - 1 - -  Risk for fall due to : Impaired balance/gait Impaired balance/gait - - -  Follow up Falls evaluation completed - - - -   Is the patient's home free of loose throw rugs in walkways, pet beds, electrical cords, etc?   No, rugs, advised to remove      Grab bars in the bathroom? no      Handrails on the stairs?   yes      Adequate lighting?   yes  Timed Get Up and Go performed: N/A, encounter not performed in clinic   Depression Screen PHQ 2/9 Scores 04/29/2018 11/28/2017 11/19/2017 11/06/2017  PHQ - 2 Score 4 5 0 0  PHQ- 9 Score 19 19 - -     Cognitive Function-WNL     6CIT Screen 05/06/2018  What Year? 0 points  What month? 0 points  What time? 0 points  Count back from 20 4 points  Months in reverse 0 points  Repeat phrase 6 points  Total Score 10    Immunization History  Administered Date(s) Administered  . Influenza,inj,Quad PF,6+ Mos 11/06/2014  . Influenza-Unspecified 10/09/2013  . Pneumococcal Polysaccharide-23 03/09/2015    Qualifies for Shingles Vaccine?yes, pt declined  Screening Tests Health Maintenance  Topic Date Due  . TETANUS/TDAP  11/29/2018 (Originally 08/10/1974)  . INFLUENZA VACCINE  08/10/2018  . MAMMOGRAM  09/19/2019  . COLONOSCOPY  01/16/2021  . Hepatitis C Screening  Completed  . HIV Screening  Completed  . PAP SMEAR-Modifier  Discontinued    Cancer Screenings: Lung: Low Dose CT Chest recommended if Age 26-80 years, 30 pack-year currently smoking OR have quit w/in 15years. Patient does not qualify. Breast:  Up to date on Mammogram? Yes   Up to date of Bone  Density/Dexa? No Colorectal: UTD, last 01/17/2011, due in 2023  Additional Screenings: Hepatitis C Screening: She completed course of Harvoni.   Last HCV Quantitative was undetectable 03/09/15 and she was deemed cured. Last GI OV 06/01/2017- Dr. Loletha Carrow She denies current abdominal pain     Plan:  Continue all medications as directed. Remain well hydrated, follow  heart healthy diet Increase exercise as tolerated Continue with various specialists as directed Keep follow-up lab and OV with primary care COVID-19 Education: Signs and symptoms of COVID-19 infection were discussed with pt and how to seek care for testing.  The importance of following the Stay at Home order, and when out- Social Distancing and wearing a facial mask were discussed today.  I have personally reviewed and noted the following in the patient's chart:   . Medical and social history . Use of alcohol, tobacco or illicit drugs  . Current medications and supplements . Functional ability and status . Nutritional status . Physical activity . Advanced directives . List of other physicians . Hospitalizations, surgeries, and ER visits in previous 12 months . Vitals . Screenings to include cognitive, depression, and falls . Referrals and appointments  In addition, I have reviewed and discussed with patient certain preventive protocols, quality metrics, and best practice recommendations. A written personalized care plan for preventive services as well as general preventive health recommendations were provided to patient.   I discussed the assessment and treatment plan with the patient. The patient was provided an opportunity to ask questions and all were answered. The patient agreed with the plan and demonstrated an understanding of the instructions.   The patient was advised to call back or seek an in-person evaluation if the symptoms worsen or if the condition fails to improve as anticipated.   Esaw Grandchild, NP  05/06/2018

## 2018-05-06 NOTE — Assessment & Plan Note (Signed)
Continue all medications as directed. Remain well hydrated, follow heart healthy diet Increase exercise as tolerated Continue with various specialists as directed Keep follow-up lab and OV with primary care Continue to follow [place] Stay at Home Order due to COVID-19 pandemic.  If you leave home, recommend wearing mask  I have personally reviewed and noted the following in the patient's chart:   . Medical and social history . Use of alcohol, tobacco or illicit drugs  . Current medications and supplements . Functional ability and status . Nutritional status . Physical activity . Advanced directives . List of other physicians . Hospitalizations, surgeries, and ER visits in previous 12 months . Vitals . Screenings to include cognitive, depression, and falls . Referrals and appointments  In addition, I have reviewed and discussed with patient certain preventive protocols, quality metrics, and best practice recommendations. A written personalized care plan for preventive services as well as general preventive health recommendations were provided to patient.   I discussed the assessment and treatment plan with the patient. The patient was provided an opportunity to ask questions and all were answered. The patient agreed with the plan and demonstrated an understanding of the instructions.   The patient was advised to call back or seek an in-person evaluation if the symptoms worsen or if the condition fails to improve as anticipated.

## 2018-05-08 ENCOUNTER — Other Ambulatory Visit: Payer: Self-pay

## 2018-05-08 ENCOUNTER — Ambulatory Visit: Payer: Medicare Other | Attending: Nurse Practitioner | Admitting: Nurse Practitioner

## 2018-05-08 DIAGNOSIS — M25551 Pain in right hip: Secondary | ICD-10-CM | POA: Diagnosis not present

## 2018-05-08 DIAGNOSIS — M25552 Pain in left hip: Secondary | ICD-10-CM

## 2018-05-08 DIAGNOSIS — M25562 Pain in left knee: Secondary | ICD-10-CM

## 2018-05-08 DIAGNOSIS — E559 Vitamin D deficiency, unspecified: Secondary | ICD-10-CM

## 2018-05-08 DIAGNOSIS — M25561 Pain in right knee: Secondary | ICD-10-CM | POA: Diagnosis not present

## 2018-05-08 DIAGNOSIS — M47816 Spondylosis without myelopathy or radiculopathy, lumbar region: Secondary | ICD-10-CM

## 2018-05-08 DIAGNOSIS — M25512 Pain in left shoulder: Secondary | ICD-10-CM

## 2018-05-08 DIAGNOSIS — M533 Sacrococcygeal disorders, not elsewhere classified: Secondary | ICD-10-CM | POA: Diagnosis not present

## 2018-05-08 DIAGNOSIS — G894 Chronic pain syndrome: Secondary | ICD-10-CM

## 2018-05-08 DIAGNOSIS — M25511 Pain in right shoulder: Secondary | ICD-10-CM

## 2018-05-08 DIAGNOSIS — G8929 Other chronic pain: Secondary | ICD-10-CM

## 2018-05-08 NOTE — Progress Notes (Signed)
Pain Management Encounter Note - Virtual Visit via Telephone Telehealth (real-time audio visits between healthcare provider and patient).  Patient's Phone No. & Preferred Pharmacy:  [phone] (home); [phone] (mobile); (Preferred) [phone]  [id number] - [address], [place] [address] Phone: [phone] Fax: [phone]   Pre-screening note:  Our staff contacted Rachael West and offered her an "in person", "face-to-face" appointment versus a telephone encounter. She indicated preferring the telephone encounter, at this time.  Reason for Virtual Visit: COVID-19*  Social distancing based on CDC and AMA recommendations.   I contacted Rachael West on 05/08/2018 at 12:06 PM by telephone and clearly identified myself as Dionisio David, NP. I verified that I was speaking with the correct person using two identifiers (Name and date of birth: July 04, 1955).  Advanced Informed Consent I sought verbal advanced consent from Rachael West for telemedicine interactions and virtual visit. I informed Rachael West of the security and privacy concerns, risks, and limitations associated with performing an evaluation and management service by telephone. I also informed Rachael West of the availability of "in person" appointments and I informed her of the possibility of a patient responsible charge related to this service. Rachael West expressed understanding and agreed to proceed.   Historic Elements   Rachael West is a 63 y.o. year old, female patient evaluated today after her last encounter by our practice on 02/06/2018. Rachael West  has a past medical history of Anemia, Anxiety, Arthritis, Asthma, Depression, GERD (gastroesophageal reflux disease), Hepatitis-C, History of chemotherapy, Hypertension, Insomnia, Lymphoma ([place]) (12/2013), Personal history of non-Hodgkin lymphomas, and Vitamin D  deficiency. She also  has a past surgical history that includes Appendectomy; Trigger finger release; Esophagogastroduodenoscopy (01/17/2011); Colonoscopy (01/17/2011); Abdominal hysterectomy; and Breast biopsy (Left, 2015). Rachael West has a current medication list which includes the following prescription(s): atenolol, buspirone, gnp calcium 1200, cyclobenzaprine, hydroxyzine, ipratropium-albuterol, latanoprost, linaclotide, lisinopril, magnesium, meloxicam, montelukast, omeprazole, ondansetron, paliperidone, polyethylene glycol, prazosin, proair hfa, sertraline, tramadol, trazodone, and vesicare. She  reports that she quit smoking about 28 years ago. Her smoking use included cigarettes. She has a 30.00 pack-year smoking history. She has never used smokeless tobacco. She reports that she does not drink alcohol or use drugs. Rachael West is allergic to latex; penicillins; codeine; cortisone; and sulfa antibiotics.   HPI  I last saw her on 02/06/2018. She is being evaluated for medication management. She is having 6/10 in her lower back and buttocks. She feels like it has flared up . She can not sit or stand for long periods of time. She feels like there is pressure with standing. She has right knee and left and ankle pain also. She feels like her neck pain has improved. She was in PT in Feb. for her shoulders and arms. She feels like it was too much so she had to stop this. She feels her medication is effective for her pain.  Pharmacotherapy Assessment  Analgesic:Tramadol50 mg2tabletsevery 6 hours. MME/day:40mg /day  Monitoring: Pharmacotherapy: No side-effects or adverse reactions reported. [place] PMP: PDMP reviewed during this encounter.       Compliance: No problems identified. Plan: Refer to "POC".  Review of recent tests  DG C-Arm 1-60 Min-No Report Fluoroscopy was utilized by the requesting physician.  No radiographic  interpretation.    Office Visit on 02/06/2018  Component Date  Value Ref Range Status  . Summary 02/06/2018 FINAL   Final   Comment: ==================================================================== TOXASSURE SELECT 13 (MW) ==================================================================== Test  Result       Flag       Units Drug Present and Declared for Prescription Verification   Tramadol                       >3759        EXPECTED   ng/mg creat   O-Desmethyltramadol            >3759        EXPECTED   ng/mg creat   N-Desmethyltramadol            >3759        EXPECTED   ng/mg creat    Source of tramadol is a prescription medication.    O-desmethyltramadol and N-desmethyltramadol are expected    metabolites of tramadol. ==================================================================== Test                      Result    Flag   Units      Ref Range   Creatinine              133              mg/dL      >=20 ==================================================================== Declared Medications:  The flagging and interpretation on this report are based on the  follo                          wing declared medications.  Unexpected results may arise from  inaccuracies in the declared medications.  **Note: The testing scope of this panel includes these medications:  Tramadol (Ultram)  **Note: The testing scope of this panel does not include following  reported medications:  Albuterol (Duoneb)  Albuterol (Proventil)  Atenolol  Calcium carbonate (Calcium Carb/Vitamin D)  Cyclobenzaprine (Flexeril)  Hydroxyzine (Vistaril)  Ipratropium (Duoneb)  Latanoprost (Xalatan)  Linaclotide (Linzess)  Lisinopril  Magnesium  Meloxicam (Mobic)  Montelukast (Singulair)  Omeprazole (Prilosec)  Ondansetron (Zofran)  Paliperidone (Invega)  Polyethylene Glycol (MiraLAX)  Prazosin (Minipress)  Sertraline (Zoloft)  Solifenacin (Vesicare)  Trazodone (Desyrel)  Vitamin D (Calcium Carb/Vitamin  D) ==================================================================== For clinical consultation, please call [phone]. =======================================================                          =============    Assessment  The primary encounter diagnosis was Chronic hip pain (Bilateral) (L>R). Diagnoses of Chronic knee pain (Bilateral) (R>L), Chronic sacroiliac joint pain, Chronic shoulder pain (Bilateral) (R>L), Spondylosis without myelopathy or radiculopathy, lumbar region, Chronic pain syndrome, and Vitamin D deficiency were also pertinent to this visit.  Plan of Care  I am having Rachael West maintain her sertraline, paliperidone, prazosin, hydrOXYzine, omeprazole, traZODone, busPIRone, linaclotide, meloxicam, cyclobenzaprine, polyethylene glycol, VESIcare, montelukast, ipratropium-albuterol, lisinopril, atenolol, latanoprost, GNP Calcium 1200, Magnesium, traMADol, ProAir HFA, and ondansetron.  Pharmacotherapy (Medications Ordered): No orders of the defined types were placed in this encounter.  Orders:  No orders of the defined types were placed in this encounter.  Follow-up plan:   Return in about 3 months (around 08/07/2018) for MedMgmt.   I discussed the assessment and treatment plan with the patient. The patient was provided an opportunity to ask questions and all were answered. The patient agreed with the plan and demonstrated an understanding of the instructions.  Patient advised to call back or seek an in-person evaluation if the symptoms or condition worsens.  Total duration of non-face-to-face encounter:  15 minutes.  Note by: Dionisio David, NP Date: 05/08/2018; Time: 12:16 PM  Disclaimer:  * Given the special circumstances of the COVID-19 pandemic, the federal government has announced that the Office for Civil Rights (OCR) will exercise its enforcement discretion and will not impose penalties on physicians using telehealth in the event of  noncompliance with regulatory requirements under the [place] (HIPAA) in connection with the good faith provision of telehealth during the TZGYF-74 national public health emergency. ([place])

## 2018-05-08 NOTE — Patient Instructions (Signed)
____________________________________________________________________________________________  Medication Rules  Purpose: To inform patients, and their family members, of our rules and regulations.  Applies to: All patients receiving prescriptions (written or electronic).  Pharmacy of record: Pharmacy where electronic prescriptions will be sent. If written prescriptions are taken to a different pharmacy, please inform the nursing staff. The pharmacy listed in the electronic medical record should be the one where you would like electronic prescriptions to be sent.  Electronic prescriptions: In compliance with the [place] Strengthen Opioid Misuse Prevention (STOP) Act of 2017 (Session Law 2017-74/H243), effective January 09, 2018, all controlled substances must be electronically prescribed. Calling prescriptions to the pharmacy will cease to exist.  Prescription refills: Only during scheduled appointments. Applies to all prescriptions.  NOTE: The following applies primarily to controlled substances (Opioid* Pain Medications).   Patient's responsibilities: 1. Pain Pills: Bring all pain pills to every appointment (except for procedure appointments). 2. Pill Bottles: Bring pills in original pharmacy bottle. Always bring the newest bottle. Bring bottle, even if empty. 3. Medication refills: You are responsible for knowing and keeping track of what medications you take and those you need refilled. The day before your appointment: write a list of all prescriptions that need to be refilled. The day of the appointment: give the list to the admitting nurse. Prescriptions will be written only during appointments. No prescriptions will be written on procedure days. If you forget a medication: it will not be "Called in", "Faxed", or "electronically sent". You will need to get another appointment to get these prescribed. No early refills. Do not call asking to have your prescription filled  early. 4. Prescription Accuracy: You are responsible for carefully inspecting your prescriptions before leaving our office. Have the discharge nurse carefully go over each prescription with you, before taking them home. Make sure that your name is accurately spelled, that your address is correct. Check the name and dose of your medication to make sure it is accurate. Check the number of pills, and the written instructions to make sure they are clear and accurate. Make sure that you are given enough medication to last until your next medication refill appointment. 5. Taking Medication: Take medication as prescribed. When it comes to controlled substances, taking less pills or less frequently than prescribed is permitted and encouraged. Never take more pills than instructed. Never take medication more frequently than prescribed.  6. Inform other Doctors: Always inform, all of your healthcare providers, of all the medications you take. 7. Pain Medication from other Providers: You are not allowed to accept any additional pain medication from any other Doctor or Healthcare provider. There are two exceptions to this rule. (see below) In the event that you require additional pain medication, you are responsible for notifying us, as stated below. 8. Medication Agreement: You are responsible for carefully reading and following our Medication Agreement. This must be signed before receiving any prescriptions from our practice. Safely store a copy of your signed Agreement. Violations to the Agreement will result in no further prescriptions. (Additional copies of our Medication Agreement are available upon request.) 9. Laws, Rules, & Regulations: All patients are expected to follow all Federal and State Laws, Statutes, Rules, & Regulations. Ignorance of the Laws does not constitute a valid excuse. The use of any illegal substances is prohibited. 10. Adopted CDC guidelines & recommendations: Target dosing levels will be  at or below 60 MME/day. Use of benzodiazepines** is not recommended.  Exceptions: There are only two exceptions to the rule of not   receiving pain medications from other Healthcare Providers. 1. Exception #1 (Emergencies): In the event of an emergency (i.e.: accident requiring emergency care), you are allowed to receive additional pain medication. However, you are responsible for: As soon as you are able, call our office [phone], at any time of the day or night, and leave a message stating your name, the date and nature of the emergency, and the name and dose of the medication prescribed. In the event that your call is answered by a member of our staff, make sure to document and save the date, time, and the name of the person that took your information.  2. Exception #2 (Planned Surgery): In the event that you are scheduled by another doctor or dentist to have any type of surgery or procedure, you are allowed (for a period no longer than 30 days), to receive additional pain medication, for the acute post-op pain. However, in this case, you are responsible for picking up a copy of our "Post-op Pain Management for Surgeons" handout, and giving it to your surgeon or dentist. This document is available at our office, and does not require an appointment to obtain it. Simply go to our office during business hours (Monday-Thursday from 8:00 AM to 4:00 PM) (Friday 8:00 AM to 12:00 Noon) or if you have a scheduled appointment with us, prior to your surgery, and ask for it by name. In addition, you will need to provide us with your name, name of your surgeon, type of surgery, and date of procedure or surgery.  *Opioid medications include: morphine, codeine, oxycodone, oxymorphone, hydrocodone, hydromorphone, meperidine, tramadol, tapentadol, buprenorphine, fentanyl, methadone. **Benzodiazepine medications include: diazepam (Valium), alprazolam (Xanax), clonazepam (Klonopine), lorazepam (Ativan), clorazepate  (Tranxene), chlordiazepoxide (Librium), estazolam (Prosom), oxazepam (Serax), temazepam (Restoril), triazolam (Halcion) (Last updated: 03/08/2017) ____________________________________________________________________________________________    

## 2018-05-10 MED ORDER — TRAMADOL HCL 50 MG PO TABS: 100.0000 mg | ORAL_TABLET | Freq: Four times a day (QID) | ORAL | 2 refills | Status: DC | PRN

## 2018-05-29 ENCOUNTER — Other Ambulatory Visit: Payer: Medicare Other

## 2018-05-29 ENCOUNTER — Other Ambulatory Visit: Payer: Self-pay

## 2018-05-29 DIAGNOSIS — I10 Essential (primary) hypertension: Secondary | ICD-10-CM

## 2018-05-29 DIAGNOSIS — Z0000 Encounter for general adult medical examination without abnormal findings: Secondary | ICD-10-CM

## 2018-05-29 DIAGNOSIS — B182 Chronic viral hepatitis C: Secondary | ICD-10-CM

## 2018-05-29 DIAGNOSIS — E785 Hyperlipidemia, unspecified: Secondary | ICD-10-CM

## 2018-05-30 LAB — COMPREHENSIVE METABOLIC PANEL
ALT: 13 IU/L (ref 0–32)
AST: 24 IU/L (ref 0–40)
Albumin/Globulin Ratio: 1.5 (ref 1.2–2.2)
Albumin: 4.1 g/dL (ref 3.8–4.8)
Alkaline Phosphatase: 82 IU/L (ref 39–117)
BUN/Creatinine Ratio: 9 — ABNORMAL LOW (ref 12–28)
BUN: 8 mg/dL (ref 8–27)
Bilirubin Total: 0.2 mg/dL (ref 0.0–1.2)
CO2: 25 mmol/L (ref 20–29)
Calcium: 9.1 mg/dL (ref 8.7–10.3)
Chloride: 101 mmol/L (ref 96–106)
Creatinine, Ser: 0.86 mg/dL (ref 0.57–1.00)
GFR calc Af Amer: 83 mL/min/{1.73_m2} (ref >59)
GFR calc non Af Amer: 72 mL/min/{1.73_m2} (ref >59)
Globulin, Total: 2.8 g/dL (ref 1.5–4.5)
Glucose: 89 mg/dL (ref 65–99)
Potassium: 4.7 mmol/L (ref 3.5–5.2)
Sodium: 138 mmol/L (ref 134–144)
Total Protein: 6.9 g/dL (ref 6.0–8.5)

## 2018-05-30 LAB — CBC WITH DIFFERENTIAL/PLATELET
Basophils Absolute: 0 10*3/uL (ref 0.0–0.2)
Basos: 1 %
EOS (ABSOLUTE): 0.1 10*3/uL (ref 0.0–0.4)
Eos: 2 %
Hematocrit: 36.8 % (ref 34.0–46.6)
Hemoglobin: 11.7 g/dL (ref 11.1–15.9)
Immature Grans (Abs): 0 10*3/uL (ref 0.0–0.1)
Immature Granulocytes: 0 %
Lymphocytes Absolute: 1.9 10*3/uL (ref 0.7–3.1)
Lymphs: 39 %
MCH: 25.3 pg — ABNORMAL LOW (ref 26.6–33.0)
MCHC: 31.8 g/dL (ref 31.5–35.7)
MCV: 80 fL (ref 79–97)
Monocytes Absolute: 0.4 10*3/uL (ref 0.1–0.9)
Monocytes: 8 %
Neutrophils Absolute: 2.4 10*3/uL (ref 1.4–7.0)
Neutrophils: 50 %
Platelets: 215 10*3/uL (ref 150–450)
RBC: 4.62 x10E6/uL (ref 3.77–5.28)
RDW: 14.6 % (ref 11.7–15.4)
WBC: 4.8 10*3/uL (ref 3.4–10.8)

## 2018-05-30 LAB — LIPID PANEL
Chol/HDL Ratio: 5.1 ratio — ABNORMAL HIGH (ref 0.0–4.4)
Cholesterol, Total: 187 mg/dL (ref 100–199)
HDL: 37 mg/dL — ABNORMAL LOW (ref >39)
LDL Calculated: 125 mg/dL — ABNORMAL HIGH (ref 0–99)
Triglycerides: 126 mg/dL (ref 0–149)
VLDL Cholesterol Cal: 25 mg/dL (ref 5–40)

## 2018-05-30 LAB — HEMOGLOBIN A1C
Est. average glucose Bld gHb Est-mCnc: 114 mg/dL
Hgb A1c MFr Bld: 5.6 % (ref 4.8–5.6)

## 2018-05-30 LAB — TSH: TSH: 2.49 u[IU]/mL (ref 0.450–4.500)

## 2018-06-01 ENCOUNTER — Other Ambulatory Visit: Payer: Self-pay | Admitting: Family Medicine

## 2018-06-03 ENCOUNTER — Other Ambulatory Visit: Payer: Self-pay | Admitting: Pain Medicine

## 2018-06-11 ENCOUNTER — Other Ambulatory Visit: Payer: Self-pay | Admitting: Adult Health

## 2018-06-11 NOTE — Telephone Encounter (Signed)
LOV 05/06/2018  Last filled 04/29/18  Is this an acute medication or a chronic.  Please review and advise. MPulliam, CMA/RT(R)

## 2018-06-11 NOTE — Telephone Encounter (Signed)
Good Afternoon Melissa, Can you please call [name] and inquire why she needs refill on Ondansetron? Also please remind her that she has a GI/Dr. Loletha Carrow Thanks! Valetta Fuller

## 2018-06-12 NOTE — Telephone Encounter (Signed)
Called and left message for patient to call the office. MPulliam, CMA/RT(R)  

## 2018-06-13 NOTE — Telephone Encounter (Signed)
Patient is still nausea since chemo.  Patient is aware that this should be managed in the future through GI.  Patient has not seen them since January please advise if refill approved until patient can follow up with GI. MPulliam, CMA/RT(R)'

## 2018-06-15 ENCOUNTER — Other Ambulatory Visit: Payer: Self-pay | Admitting: Adult Health

## 2018-06-18 ENCOUNTER — Telehealth: Payer: Self-pay

## 2018-06-18 NOTE — Telephone Encounter (Signed)
Received Epic notification that pt has not read MyChart message regarding results.     Recent lab results   From Fonnie Mu, CMA To [name] Sent 05/30/2018 10:20 AM  Dear [name],   Valetta Fuller has reviewed your recent lab results and asked that I inform you that your thyroid test, complete blood count, metabolic panel and M7W (3 month average of blood sugars) are all normal.   Your total cholesterol is 187 mg/dL (normal is less than 199), HDL (good cholesterol) is 37 mg/dL (normal is greater than 40), and LDL (bad cholesterol) is 125 (normal is less than 99).   Your 10-year risk score for a sudden acute cardiac even such as stroke or heart attack is 7.3%.  Values used to calculate the score:   Age: 63 years   Sex: Female   Is Non-Hispanic African American: Yes   Diabetic: No   Tobacco smoker: No   Systolic Blood Pressure: 808 mmHg   Is BP treated: Yes     Valetta Fuller would like to know if you have ever been on a statin before?  Valetta Fuller recommends starting Atorvastatin 10mg  once daily to reduce LDL and your overall risk score.  Please let me know if you are agreeable to starting this medication so that she may send in a prescription for you. If you do wish to take this medication, you will need a liver function lab draw in 6 weeks and lipids in 6 months.   Please respond to this message and let us know whether or not you want to start this medication.  Thanks!   Wishing you well,  Kenney Houseman, CMA for  Mina Marble, NP   [address]   MyChart User Last Read On  Rachael West Not Read      Letter mailed to pt.  Charyl Bigger, CMA

## 2018-07-30 ENCOUNTER — Encounter: Payer: Self-pay | Admitting: Pain Medicine

## 2018-07-30 NOTE — Progress Notes (Signed)
Pain Management Virtual Encounter Note - Virtual Visit via Telephone Telehealth (real-time audio visits between healthcare provider and patient).   Patient's Phone No. & Preferred Pharmacy:  [phone] (home); [phone] (mobile); (Preferred) [phone] [email or web address]  [id number] [address], [place] [place] [address] Phone: [phone] Fax: [phone]    Pre-screening note:  Our staff contacted Rachael West and offered her an "in person", "face-to-face" appointment versus a telephone encounter. She indicated preferring the telephone encounter, at this time.   Reason for Virtual Visit: COVID-19*  Social distancing based on CDC and AMA recommendations.   I contacted Rachael West on 07/31/2018 via telephone.      I clearly identified myself as Gaspar Cola, MD. I verified that I was speaking with the correct person using two identifiers (Name: Rachael West, and date of birth: 19-Nov-1955).  Advanced Informed Consent I sought verbal advanced consent from Rachael West for virtual visit interactions. I informed Rachael West of possible security and privacy concerns, risks, and limitations associated with providing "not-in-person" medical evaluation and management services. I also informed Rachael West of the availability of "in-person" appointments. Finally, I informed her that there would be a charge for the virtual visit and that she could be  personally, fully or partially, financially responsible for it. Rachael West expressed understanding and agreed to proceed.   Historic Elements   Rachael West is a 63 y.o. year old, female patient evaluated today after her last encounter by our practice on 06/03/2018. Rachael West  has a past medical history of Anemia, Anxiety, Arthritis, Asthma, Depression, GERD (gastroesophageal reflux disease), Hepatitis-C, History of  chemotherapy, Hypertension, Insomnia, Lymphoma ([place]) (12/2013), Personal history of non-Hodgkin lymphomas, and Vitamin D deficiency. She also  has a past surgical history that includes Appendectomy; Trigger finger release; Esophagogastroduodenoscopy (01/17/2011); Colonoscopy (01/17/2011); Abdominal hysterectomy; and Breast biopsy (Left, 2015). Rachael West has a current medication list which includes the following prescription(s): atenolol, buspirone, gnp calcium 1200, cyclobenzaprine, hydroxyzine, ipratropium-albuterol, latanoprost, linaclotide, lisinopril, magnesium, meloxicam, montelukast, omeprazole, paliperidone, polyethylene glycol, prazosin, proair hfa, sertraline, tramadol, trazodone, vesicare, dimenhydrinate, lubiprostone, and benefiber. She  reports that she quit smoking about 28 years ago. Her smoking use included cigarettes. She has a 30.00 pack-year smoking history. She has never used smokeless tobacco. She reports that she does not drink alcohol or use drugs. Rachael West is allergic to latex; penicillins; codeine; cortisone; and sulfa antibiotics.   HPI  Today, she is being contacted for medication management.  The patient denies any side effects or adverse reactions to any of the medications except for some constipation which could be opioid induced.  Because of this, today we will do a trial on some medication for this constipation.  In addition, the patient indicates having an increase in her right sided low back pain.  In the past we have treated these with diagnostic lumbar facet blocks.  The patient indicates wanting to come in for a lumbar facet block, as soon as possible.  Pharmacotherapy Assessment  Analgesic: Tramadol50 mg,2tabs (100 mg) PO q 6 hrs (400 mg/day of tramadol) MME/day:40mg /day.   Monitoring: Pharmacotherapy: No side-effects or adverse reactions reported. [place] PMP: PDMP reviewed during this encounter.       Compliance: No problems identified. Effectiveness:  Clinically acceptable. Plan: Refer to "POC".  Pertinent Labs   SAFETY SCREENING Profile Lab Results  Component Value Date   HCVAB REACTIVE (A) 01/12/2014   HIV NONREACTIVE 01/12/2014   Renal Function  Lab Results  Component Value Date   BUN 8 05/29/2018   CREATININE 0.86 05/29/2018   BCR 9 (L) 05/29/2018   GFRAA 83 05/29/2018   GFRNONAA 72 05/29/2018   Hepatic Function Lab Results  Component Value Date   AST 24 05/29/2018   ALT 13 05/29/2018   ALBUMIN 4.1 05/29/2018   UDS Summary  Date Value Ref Range Status  02/06/2018 FINAL  Final    Comment:    ==================================================================== TOXASSURE SELECT 13 (MW) ==================================================================== Test                             Result       Flag       Units Drug Present and Declared for Prescription Verification   Tramadol                       >3759        EXPECTED   ng/mg creat   O-Desmethyltramadol            >3759        EXPECTED   ng/mg creat   N-Desmethyltramadol            >3759        EXPECTED   ng/mg creat    Source of tramadol is a prescription medication.    O-desmethyltramadol and N-desmethyltramadol are expected    metabolites of tramadol. ==================================================================== Test                      Result    Flag   Units      Ref Range   Creatinine              133              mg/dL      >=20 ==================================================================== Declared Medications:  The flagging and interpretation on this report are based on the  following declared medications.  Unexpected results may arise from  inaccuracies in the declared medications.  **Note: The testing scope of this panel includes these medications:  Tramadol (Ultram)  **Note: The testing scope of this panel does not include following  reported medications:  Albuterol (Duoneb)  Albuterol (Proventil)  Atenolol  Calcium carbonate  (Calcium Carb/Vitamin D)  Cyclobenzaprine (Flexeril)  Hydroxyzine (Vistaril)  Ipratropium (Duoneb)  Latanoprost (Xalatan)  Linaclotide (Linzess)  Lisinopril  Magnesium  Meloxicam (Mobic)  Montelukast (Singulair)  Omeprazole (Prilosec)  Ondansetron (Zofran)  Paliperidone (Invega)  Polyethylene Glycol (MiraLAX)  Prazosin (Minipress)  Sertraline (Zoloft)  Solifenacin (Vesicare)  Trazodone (Desyrel)  Vitamin D (Calcium Carb/Vitamin D) ==================================================================== For clinical consultation, please call [phone]. ====================================================================    Note: Above Lab results reviewed.  Recent imaging  DG C-Arm 1-60 Min-No Report Fluoroscopy was utilized by the requesting physician.  No radiographic  interpretation.   Assessment  The primary encounter diagnosis was Chronic pain syndrome. Diagnoses of Chronic low back pain (Primary Area of Pain) (Bilateral) w/ sciatica (Left), Chronic lower extremity pain (Secondary Area of Pain) (Left), Chronic upper back pain (Tertiary Area of Pain) (Bilateral) (L>R), Chronic neck pain (Fourth Area of Pain) (Bilateral) (L>R), Vitamin D deficiency, Chronic musculoskeletal pain, Lumbar facet syndrome (Bilateral) (L>R), Chronic sacroiliac joint pain, and Therapeutic opioid-induced constipation (OIC) were also pertinent to this visit.  Plan of Care  I have changed Connye Burkitt. West's traMADol. I am also having her start on lubiprostone and Benefiber. Additionally, I  am having her maintain her sertraline, paliperidone, prazosin, hydrOXYzine, omeprazole, traZODone, busPIRone, linaclotide, meloxicam, cyclobenzaprine, polyethylene glycol, VESIcare, ipratropium-albuterol, lisinopril, atenolol, latanoprost, ProAir HFA, montelukast, GNP Calcium 1200, and Magnesium.  Pharmacotherapy (Medications Ordered): Meds ordered this encounter  Medications  . traMADol (ULTRAM) 50 MG tablet     Sig: Take 2 tablets (100 mg total) by mouth every 6 (six) hours as needed for severe pain. Must last 30 days    Dispense:  240 tablet    Refill:  5    Chronic Pain: STOP Act (Not applicable) Fill 1 day early if closed on refill date. Do not fill until: 08/08/2018. To last until: 02/04/2019. Avoid benzodiazepines within 8 hours of opioids  . Calcium Carbonate-Vit D-Min (GNP CALCIUM 1200) 1200-1000 MG-UNIT CHEW    Sig: Chew 1,200 mg by mouth daily with breakfast. Take in combination with vitamin D and magnesium.    Dispense:  30 tablet    Refill:  5    Fill one day early if pharmacy is closed on scheduled refill date. May substitute for generic if available.  . Magnesium 500 MG CAPS    Sig: Take 1 capsule (500 mg total) by mouth 2 (two) times daily at 8 am and 10 pm.    Dispense:  180 capsule    Refill:  1    Fill one day early if pharmacy is closed on scheduled refill date. May substitute for generic if available.  . lubiprostone (AMITIZA) 24 MCG capsule    Sig: Take 1 capsule (24 mcg total) by mouth 2 (two) times daily with a meal. Swallow the medication whole. Do not break or chew the medication.    Dispense:  60 capsule    Refill:  5    Fill one day early if pharmacy is closed on scheduled refill date. May substitute for generic if available. Instruct patient to go down to QD if loose stools becomes a problem.  . Wheat Dextrin (BENEFIBER) POWD    Sig: Take 6 g by mouth 3 (three) times daily before meals. (2 tsp = 6 g)    Dispense:  730 g    Refill:  5    Fill one day early if pharmacy is closed on scheduled refill date. May substitute for generic if available.   Orders:  Orders Placed This Encounter  Procedures  . LUMBAR FACET(MEDIAL BRANCH NERVE BLOCK) MBNB    Standing Status:   Future    Standing Expiration Date:   08/31/2018    Scheduling Instructions:     Side: Bilateral     Level: L3-4, L4-5, & L5-S1 Facets (L2, L3, L4, L5, & S1 Medial Branch Nerves)     Sedation: Patient's  choice.     Timeframe: ASAA    Order Specific Question:   Where will this procedure be performed?    Answer:   [facility]   Follow-up plan:   Return in about 6 months (around 02/03/2019) for (VV), E/M (MM), in addition, Procedure (w/ sedation): (B) L-FCT Blk, (ASAP).      Interventional management options: Planned, scheduled, and/or pending:   Diagnosticright vs. bilateral lumbar facet block#3under fluoro and IV sedation    Considering:   Diagnostic left-sided lumbar epidural steroid injection Diagnostic bilateral lumbar facet block Possible bilateral lumbar facetRFA Diagnostic bilateral thoracic facet block Possiblebilateral thoracic facetRFA Diagnostic bilateral cervical facet block Possible bilateral cervical facetRFA Diagnostic bilateral greater occipital nerve blocks Diagnostic trigger point injections   Palliative PRN treatment(s):   Palliative/diagnosticbilateral lumbar  facet block#2under fluoro and IV sedation  Palliative/diagnostic bilateralcervicalfacet block#2under fluoro and IV sedation    Recent Visits Date Type Provider Dept  05/08/18 Office Visit Vevelyn Francois, NP [facility] recent visits within past 90 days and meeting all other requirements   Today's Visits Date Type Provider Dept  07/31/18 Office Visit Milinda Pointer, MD [facility]  Showing today's visits and meeting all other requirements   Future Appointments No visits were found meeting these conditions.  Showing future appointments within next 90 days and meeting all other requirements   I discussed the assessment and treatment plan with the patient. The patient was provided an opportunity to ask questions and all were answered. The patient agreed with the plan and demonstrated an understanding of the instructions.  Patient advised to call back or seek an in-person evaluation if the symptoms or condition worsens.  Total duration of  non-face-to-face encounter: 15 minutes.  Note by: Gaspar Cola, MD Date: 07/31/2018; Time: 4:48 PM  Note: This dictation was prepared with Dragon dictation. Any transcriptional errors that may result from this process are unintentional.  Disclaimer:  * Given the special circumstances of the COVID-19 pandemic, the federal government has announced that the Office for Civil Rights (OCR) will exercise its enforcement discretion and will not impose penalties on physicians using telehealth in the event of noncompliance with regulatory requirements under the [place] (HIPAA) in connection with the good faith provision of telehealth during the WKMQK-86 national public health emergency. ([place])

## 2018-07-31 ENCOUNTER — Other Ambulatory Visit: Payer: Self-pay

## 2018-07-31 ENCOUNTER — Encounter: Payer: Self-pay | Admitting: Adult Health

## 2018-07-31 ENCOUNTER — Ambulatory Visit (INDEPENDENT_AMBULATORY_CARE_PROVIDER_SITE_OTHER): Payer: Medicare Other | Admitting: Adult Health

## 2018-07-31 ENCOUNTER — Ambulatory Visit: Payer: Medicare Other | Attending: Pain Medicine | Admitting: Pain Medicine

## 2018-07-31 VITALS — BP 111/70 | HR 80 | Ht 65.75 in | Wt 209.0 lb

## 2018-07-31 DIAGNOSIS — M7918 Myalgia, other site: Secondary | ICD-10-CM

## 2018-07-31 DIAGNOSIS — E7800 Pure hypercholesterolemia, unspecified: Secondary | ICD-10-CM

## 2018-07-31 DIAGNOSIS — G894 Chronic pain syndrome: Secondary | ICD-10-CM

## 2018-07-31 DIAGNOSIS — G8929 Other chronic pain: Secondary | ICD-10-CM

## 2018-07-31 DIAGNOSIS — M5442 Lumbago with sciatica, left side: Secondary | ICD-10-CM | POA: Diagnosis not present

## 2018-07-31 DIAGNOSIS — M47816 Spondylosis without myelopathy or radiculopathy, lumbar region: Secondary | ICD-10-CM

## 2018-07-31 DIAGNOSIS — I10 Essential (primary) hypertension: Secondary | ICD-10-CM

## 2018-07-31 DIAGNOSIS — M79605 Pain in left leg: Secondary | ICD-10-CM | POA: Diagnosis not present

## 2018-07-31 DIAGNOSIS — E785 Hyperlipidemia, unspecified: Secondary | ICD-10-CM

## 2018-07-31 DIAGNOSIS — M549 Dorsalgia, unspecified: Secondary | ICD-10-CM

## 2018-07-31 DIAGNOSIS — K5903 Drug induced constipation: Secondary | ICD-10-CM

## 2018-07-31 DIAGNOSIS — M533 Sacrococcygeal disorders, not elsewhere classified: Secondary | ICD-10-CM

## 2018-07-31 DIAGNOSIS — M542 Cervicalgia: Secondary | ICD-10-CM

## 2018-07-31 DIAGNOSIS — Z0000 Encounter for general adult medical examination without abnormal findings: Secondary | ICD-10-CM

## 2018-07-31 DIAGNOSIS — T402X5A Adverse effect of other opioids, initial encounter: Secondary | ICD-10-CM

## 2018-07-31 DIAGNOSIS — E559 Vitamin D deficiency, unspecified: Secondary | ICD-10-CM

## 2018-07-31 MED ORDER — LUBIPROSTONE 24 MCG PO CAPS: 24.0000 ug | ORAL_CAPSULE | Freq: Two times a day (BID) | ORAL | 5 refills | Status: DC

## 2018-07-31 MED ORDER — TRAMADOL HCL 50 MG PO TABS: 100.0000 mg | ORAL_TABLET | Freq: Four times a day (QID) | ORAL | 5 refills | Status: DC | PRN

## 2018-07-31 MED ORDER — MAGNESIUM 500 MG PO CAPS: 500.0000 mg | ORAL_CAPSULE | Freq: Two times a day (BID) | ORAL | 1 refills | Status: DC

## 2018-07-31 MED ORDER — GNP CALCIUM 1200 1200-1000 MG-UNIT PO CHEW: 1200.0000 mg | CHEWABLE_TABLET | Freq: Every day | ORAL | 5 refills | Status: DC

## 2018-07-31 MED ORDER — BENEFIBER PO POWD: 6.0000 g | Freq: Three times a day (TID) | ORAL | 5 refills | Status: DC

## 2018-07-31 NOTE — Assessment & Plan Note (Signed)
The 10-year ASCVD risk score Mikey Bussing DC Brooke Bonito., et al., 2013) is: 6.5%   Values used to calculate the score:     Age: 63 years     Sex: Female     Is Non-Hispanic African American: Yes     Diabetic: No     Tobacco smoker: No     Systolic Blood Pressure: 497 mmHg     Is BP treated: Yes     HDL Cholesterol: 37 mg/dL     Total Cholesterol: 187 mg/dL

## 2018-07-31 NOTE — Assessment & Plan Note (Signed)
She reports ambulatory readings- SBP 110-130 DBP 70-90 HR 80-90s She denies acute cardiac sx's Continue Lisinopril 5mg  QD Continue Atenolol 25mg  QD

## 2018-07-31 NOTE — Assessment & Plan Note (Signed)
Assessment and Plan: Continue all medications as directed Discuss opiates and constipation/nausea SE with Pain Mgt specialist Continue close follow-up with various [facility] specialists Remain well hydrated, reduce use of margarine in cooking Remain as active as possible If GI sx's worsen f/u with Dr. Orlie Pollen- contact information provided to pt Continue to social distance and wear a mask when in public  Follow Up Instructions: 4 months OV, come fasting to recheck lipids   I discussed the assessment and treatment plan with the patient. The patient was provided an opportunity to ask questions and all were answered. The patient agreed with the plan and demonstrated an understanding of the instructions.

## 2018-07-31 NOTE — Progress Notes (Signed)
Virtual Visit via Telephone Note  I connected with Rachael West on 07/31/18 at 10:45 AM EDT by telephone and verified that I am speaking with the correct person using two identifiers.  Location: Patient:Home Provider: In Clinic    I discussed the limitations, risks, security and privacy concerns of performing an evaluation and management service by telephone and the availability of in person appointments. I also discussed with the patient that there may be a patient responsible charge related to this service. The patient expressed understanding and agreed to proceed.   History of Present Illness: 11/28/2017 OV: Rachael West is here for CPE She reports medication compliance, denies SE Regular f/u by Pain Med, Oncology, Rheumatology, Psychiatry She attends weekly "group meetings for my PTSD" She denies thoughts of harming herself/others She reports falling when rising too quickly or walking too quickly around turns. She ambulates with cane. She reports that her Rheumatologist recently dx'd her with Fibromyalgia Last course of PT was May 2019, she is agreeable to another course. 04/29/2018 OV: Rachael West calls in today for regular f/u: HTN, GERD, Depression, Anxiety, Dyspnea She reports home readings SBP 110-150 DBP 70-80 Currently- 126/81She denies acute cardiac sx's She reports using the neb 1-2 times/day and feels that her dyspnea is stable- not increasing from her baseline She denies fever She has remained home and has only been out to procure groceries the last 4 weeks and will don mask/gloves when out in public. She has appt with Rheumatologist at end of this month Her "Group Therapy" for PTSD has been cancelled due to COVID-19 pandemic, however [place] [facility] is offering one/one counseling at no additional charge.  She hanot taken advantage of this program- encouraged to call [facility] clinic to set up appt ASAP She reports emotional liability, however vehemently denies  SI/HI She reports medication compliance on Sertraline 200mg  QD, Buspar 5mg  2-3 times/day She denies any ETOH use She completed formal course of PT She denies regular exercise She is currently staying with her elderly mother  07/31/2018 OV: Rachael West calls in for regular f/u: HTN, Chronic constipation, Anxiety/Depression She reports ambulatory readings- SBP 110-130 DBP 70-90 HR 80-90s She denies acute cardiac sx's She denies ACE cough She is followed by [facility] every Wed- via [facility] therapy every 2 weeks - via [facility] Q6weeks - in office She reports stable mood, denies SI/HI She reports perpetual nausea since her lymphoma treatment in 2016- has been using Zofran regularly to treat She is followed by Pain Management Q6Weeks She is on Tramadol 50mg - she reports 6 tablets/day Discussed that chronic opiate use can cause chronic constipation and may even be contributing her nausea  Encourage her to discuss these sx's with her Pain Mgt specialist at her next f/u. She drinks 5 x 16ox bottles of water per day She reports cooking most of her meals with margarine The 10-year ASCVD risk score Mikey Bussing DC Jr., et al., 2013) is: 6.5%   Values used to calculate the score:     Age: 63 years     Sex: Female     Is Non-Hispanic African American: Yes     Diabetic: No     Tobacco smoker: No     Systolic Blood Pressure: 893 mmHg     Is BP treated: Yes     HDL Cholesterol: 37 mg/dL     Total Cholesterol: 187 mg/dL  LDL-125 Advised her to reduce saturated fat intake, will recheck lipids at f/u She remains active with daily walking  and home calisthenics program  Patient Care Team    Relationship Specialty Notifications Start End  Ballard, Valetta Fuller D, NP PCP - General Family Medicine  03/21/17   Laurence Spates, MD Consulting Physician Gastroenterology  12/01/14   Doran Stabler, MD Consulting Physician Gastroenterology  05/01/17   Elsie Saas, MD Consulting  Physician Orthopedic Surgery  05/01/17     Patient Active Problem List   Diagnosis Date Noted  . Encounter for Medicare annual wellness exam 05/06/2018  . Gait disturbance 11/28/2017  . Cervical facet syndrome (Bilateral) (R>L) 10/24/2017  . Spondylosis without myelopathy or radiculopathy, cervical region 10/24/2017  . Latex precautions, history of latex allergy 10/09/2017  . Elevated uric acid in blood 09/27/2017  . Chronic idiopathic gout 09/27/2017  . DDD (degenerative disc disease), cervical 09/27/2017  . 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 Chronic shoulder pain (Bilateral) (R>L) 09/26/2017  . Chronic hip pain (Bilateral) (L>R) 09/26/2017  . Cervicalgia 09/26/2017  . DDD (degenerative disc disease), lumbar 09/25/2017  . Lumbar facet arthropathy 09/25/2017  . Lumbar facet syndrome (Bilateral) (L>R) 09/25/2017  . Spondylosis without myelopathy or radiculopathy, lumbar region 09/25/2017  . DDD (degenerative disc disease), thoracic 09/25/2017  . Thoracolumbar dextroscoliosis 09/25/2017  . Vitamin D deficiency 09/25/2017  . Cervicogenic headache 09/25/2017  . Other dysphagia 09/24/2017  . Tendinitis of left rotator cuff 09/24/2017  . Tendinitis of right rotator cuff 09/24/2017  . Fibromyalgia 09/24/2017  . Elevated C-reactive protein (CRP) 09/03/2017  . Elevated sed rate 09/03/2017  . Chronic low back pain (Primary Area of Pain) (Bilateral) w/ sciatica (Left) 08/29/2017  . Chronic lower extremity pain (Secondary Area of Pain) (Left) 08/29/2017  . Chronic upper back pain [place] Area of Pain) (Bilateral) (L>R) 08/29/2017  . Chronic neck pain (Fourth Area of Pain) (Bilateral) (L>R) 08/29/2017  . Occipital headache 08/29/2017  . Chronic knee pain (Bilateral) (R>L) 08/29/2017  . Chronic pain syndrome 08/29/2017  . Pharmacologic therapy 08/29/2017  . Long term current use of opiate analgesic 08/29/2017  . Disorder of skeletal system 08/29/2017  .  Problems influencing health status 08/29/2017  . Chronic sacroiliac joint pain 08/29/2017  . Hyperlipidemia 07/31/2017  . GERD (gastroesophageal reflux disease) 05/01/2017  . Anxiety 05/01/2017  . Depression 05/01/2017  . Healthcare maintenance 05/01/2017  . Chronic idiopathic constipation 05/01/2017  . Dyspnea 10/22/2014  . Obesity (BMI 30-39.9) 10/22/2014  . Obesity 09/12/2014  . Essential hypertension 09/12/2014  . Pneumonia, lobar ([place]) 09/11/2014  . Intermittent asthma 09/11/2014  . Cirrhosis ([place]) 09/08/2014  . Chronic hepatitis C without hepatic coma ([place]) 01/15/2014  . Large B-cell lymphoma ([place]) 01/12/2014  . Constipation 04/24/2012  . Anemia 05/25/2011  . Atypical chest pain 05/25/2011  . History of thyroid disease 05/25/2011  . Hepatitis C 05/25/2011  . Benign hypertension 05/25/2011  . Gastritis 05/25/2011  . Asthma 05/25/2011  . Stress incontinence 02/10/2011  . Urge incontinence 02/10/2011  . Osteoarthrosis involving multiple sites 10/17/2010     Past Medical History:  Diagnosis Date  . Anemia   . Anxiety   . Arthritis   . Asthma   . Depression   . GERD (gastroesophageal reflux disease)   . Hepatitis-C   . History of chemotherapy   . Hypertension   . Insomnia   . Lymphoma ([place]) 12/2013   aggressive lymphoma  . Personal history of non-Hodgkin lymphomas   . Vitamin D deficiency      Past Surgical History:  Procedure Laterality Date  . ABDOMINAL  HYSTERECTOMY    . APPENDECTOMY    . BREAST BIOPSY Left 2015   lymphoma  . COLONOSCOPY  01/17/2011   Procedure: COLONOSCOPY;  Surgeon: Winfield Cunas., MD;  Location: [facility];  Service: Endoscopy;  Laterality: N/A;  . ESOPHAGOGASTRODUODENOSCOPY  01/17/2011   Procedure: ESOPHAGOGASTRODUODENOSCOPY (EGD);  Surgeon: Winfield Cunas., MD;  Location: [facility];  Service: Endoscopy;  Laterality: N/A;  . TRIGGER FINGER RELEASE       Family History  Problem Relation Age of Onset  . Asthma Mother    . Allergies Mother   . Asthma Sister   . Heart disease Father   . Heart disease Brother   . Diabetes Maternal Grandmother   . Diabetes Maternal Grandfather   . Heart disease Paternal Grandmother   . Heart disease Paternal Grandfather   . Colon cancer Paternal Aunt   . Liver cancer Paternal Uncle   . Breast cancer Cousin   . Ovarian cancer Other        great aunt (m)  . Pancreatic cancer Other        great aunt (m)     Social History   Substance and Sexual Activity  Drug Use No     Social History   Substance and Sexual Activity  Alcohol Use No  . Alcohol/week: 0.0 standard drinks     Social History   Tobacco Use  Smoking Status Former Smoker  . Packs/day: 1.50  . Years: 20.00  . Pack years: 30.00  . Types: Cigarettes  . Quit date: 01/09/1990  . Years since quitting: 28.5  Smokeless Tobacco Never Used     Outpatient Encounter Medications as of 07/31/2018  Medication Sig  . atenolol (TENORMIN) 25 MG tablet Take 1 tablet (25 mg total) by mouth daily.  . busPIRone (BUSPAR) 5 MG tablet Take 1 tablet (5 mg total) by mouth 2 (two) times daily. (Patient taking differently: Take 5 mg by mouth 3 (three) times daily. )  . Calcium Carbonate-Vit D-Min (GNP CALCIUM 1200) 1200-1000 MG-UNIT CHEW Chew 1,200 mg by mouth daily with breakfast. Take in combination with vitamin D and magnesium.  . cyclobenzaprine (FLEXERIL) 5 MG tablet Take 1 tablet by mouth at bedtime.  . dimenhyDRINATE (DRAMAMINE) 50 MG tablet Take 50 mg by mouth every 8 (eight) hours as needed.  . hydrOXYzine (VISTARIL) 50 MG capsule Take 50 mg by mouth at bedtime.  Marland Kitchen ipratropium-albuterol (DUONEB) 0.5-2.5 (3) MG/3ML SOLN Inhale 3 mLs into the lungs See admin instructions.  Marland Kitchen latanoprost (XALATAN) 0.005 % ophthalmic solution Place 1 drop into both eyes daily.  Marland Kitchen linaclotide (LINZESS) 72 MCG capsule Take 1 capsule (72 mcg total) by mouth daily before breakfast.  . lisinopril (PRINIVIL,ZESTRIL) 5 MG tablet Take 1  tablet (5 mg total) by mouth daily.  . Magnesium 500 MG CAPS Take 1 capsule (500 mg total) by mouth 2 (two) times daily at 8 am and 10 pm.  . meloxicam (MOBIC) 15 MG tablet Take 15 mg by mouth daily.  . montelukast (SINGULAIR) 10 MG tablet TAKE 1 TABLET BY MOUTH EVERY DAY  . omeprazole (PRILOSEC) 40 MG capsule 1 CAPSULE BY MOUTH DAILY  . paliperidone (INVEGA) 6 MG 24 hr tablet Take 6 mg by mouth every morning.  . polyethylene glycol (MIRALAX / GLYCOLAX) packet Take 17 g by mouth 2 (two) times daily.  . prazosin (MINIPRESS) 1 MG capsule Take 1 mg by mouth at bedtime.  Marland Kitchen PROAIR HFA 108 (90 Base) MCG/ACT inhaler INHALE  2 PUFFS INTO THE LUNGS 4 (FOUR) TIMES DAILY.  Marland Kitchen sertraline (ZOLOFT) 100 MG tablet Take 200 mg by mouth every morning.  . traMADol (ULTRAM) 50 MG tablet Take 2 tablets (100 mg total) by mouth every 6 (six) hours as needed for severe pain.  . traZODone (DESYREL) 50 MG tablet 1 TABLET BY MOUTH AT BEDTIME  . VESICARE 10 MG tablet Take 10 mg by mouth daily.  . [DISCONTINUED] ondansetron (ZOFRAN) 4 MG tablet TAKE 1 TABLET (4 MG TOTAL) BY MOUTH AS NEEDED.  . [DISCONTINUED] traMADol (ULTRAM) 50 MG tablet Take 1 tablet (50 mg total) by mouth every 6 (six) hours as needed for severe pain.   No facility-administered encounter medications on file as of 07/31/2018.     Allergies: Latex, Penicillins, Codeine, Cortisone, and Sulfa antibiotics  Body mass index is 33.99 kg/m.  Blood pressure 111/70, pulse 80, height 5' 5.75" (1.67 m), weight 209 lb (94.8 kg). Review of Systems: General:   Denies fever, chills, unexplained weight loss.  Optho/Auditory:   Denies visual changes, blurred vision/LOV Respiratory:   Denies SOB, DOE more than baseline levels.  Cardiovascular:   Denies chest pain, palpitations, new onset peripheral edema  Gastrointestinal:   Denies nausea, vomiting, diarrhea.  Genitourinary: Denies dysuria, freq/ urgency, flank pain or discharge from genitals.  Endocrine:      Denies hot or cold intolerance, polyuria, polydipsia. Musculoskeletal:   Denies unexplained myalgias, joint swelling, unexplained arthralgias, gait problems.  Skin:  Denies rash, suspicious lesions Neurological:     Denies dizziness, unexplained weakness, numbness  Psychiatric/Behavioral:   Denies mood changes, suicidal or homicidal ideations, hallucinations Observations/Objective: No acute distress noted during the telephone conversation  Assessment and Plan: Continue all medications as directed Discuss opiates and constipation/nausea SE with Pain Mgt specialist Continue close follow-up with various [facility] specialists Remain well hydrated, reduce use of margarine in cooking Remain as active as possible If GI sx's worsen f/u with Dr. Orlie Pollen- contact information provided to pt Continue to social distance and wear a mask when in public  Follow Up Instructions: 4 months OV, come fasting to recheck lipids   I discussed the assessment and treatment plan with the patient. The patient was provided an opportunity to ask questions and all were answered. The patient agreed with the plan and demonstrated an understanding of the instructions.   The patient was advised to call back or seek an in-person evaluation if the symptoms worsen or if the condition fails to improve as anticipated.  I provided 22 minutes of non-face-to-face time during this encounter.   Esaw Grandchild, NP

## 2018-07-31 NOTE — Patient Instructions (Signed)

## 2018-08-01 ENCOUNTER — Other Ambulatory Visit: Payer: Self-pay | Admitting: Hematology and Oncology

## 2018-08-01 DIAGNOSIS — C8510 Unspecified B-cell lymphoma, unspecified site: Secondary | ICD-10-CM

## 2018-08-01 NOTE — Assessment & Plan Note (Signed)
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  We will order the scans and follow-up after that.  I will call her with results of these tests.  Chronic hepatitis C:Liver function tests are adequate, CT abdomen suggests mild cirrhosis. Patient seesgastroenterology. Severe fatigue: No connection to lymphoma. We will await the results of the scans.  Return to clinic in1 yearfor follow-upwith labs and CT scans. Scans once a yearuntil 5 years

## 2018-08-02 ENCOUNTER — Other Ambulatory Visit: Payer: Medicare Other

## 2018-08-05 ENCOUNTER — Other Ambulatory Visit: Payer: Self-pay

## 2018-08-05 ENCOUNTER — Encounter (HOSPITAL_COMMUNITY): Payer: Self-pay

## 2018-08-05 ENCOUNTER — Ambulatory Visit (HOSPITAL_COMMUNITY)
Admission: RE | Admit: 2018-08-05 | Discharge: 2018-08-05 | Disposition: A | Discharge status: Home / Self Care | Payer: Medicare Other | Source: Ambulatory Visit | Attending: Hematology and Oncology | Admitting: Hematology and Oncology

## 2018-08-05 ENCOUNTER — Inpatient Hospital Stay: Payer: Medicare Other | Attending: Hematology and Oncology

## 2018-08-05 DIAGNOSIS — C8510 Unspecified B-cell lymphoma, unspecified site: Secondary | ICD-10-CM | POA: Diagnosis not present

## 2018-08-05 DIAGNOSIS — B182 Chronic viral hepatitis C: Secondary | ICD-10-CM | POA: Diagnosis not present

## 2018-08-05 DIAGNOSIS — Z9221 Personal history of antineoplastic chemotherapy: Secondary | ICD-10-CM | POA: Insufficient documentation

## 2018-08-05 DIAGNOSIS — Z8572 Personal history of non-Hodgkin lymphomas: Secondary | ICD-10-CM | POA: Diagnosis present

## 2018-08-05 DIAGNOSIS — Z79899 Other long term (current) drug therapy: Secondary | ICD-10-CM | POA: Insufficient documentation

## 2018-08-05 LAB — CBC WITH DIFFERENTIAL (CANCER CENTER ONLY)
Abs Immature Granulocytes: 0.01 10*3/uL (ref 0.00–0.07)
Basophils Absolute: 0.1 10*3/uL (ref 0.0–0.1)
Basophils Relative: 1 %
Eosinophils Absolute: 0.1 10*3/uL (ref 0.0–0.5)
Eosinophils Relative: 3 %
HCT: 40 % (ref 36.0–46.0)
Hemoglobin: 12.4 g/dL (ref 12.0–15.0)
Immature Granulocytes: 0 %
Lymphocytes Relative: 49 %
Lymphs Abs: 2.6 10*3/uL (ref 0.7–4.0)
MCH: 25.3 pg — ABNORMAL LOW (ref 26.0–34.0)
MCHC: 31 g/dL (ref 30.0–36.0)
MCV: 81.5 fL (ref 80.0–100.0)
Monocytes Absolute: 0.4 10*3/uL (ref 0.1–1.0)
Monocytes Relative: 8 %
Neutro Abs: 2.1 10*3/uL (ref 1.7–7.7)
Neutrophils Relative %: 39 %
Platelet Count: 198 10*3/uL (ref 150–400)
RBC: 4.91 MIL/uL (ref 3.87–5.11)
RDW: 13.2 % (ref 11.5–15.5)
WBC Count: 5.3 10*3/uL (ref 4.0–10.5)
nRBC: 0 % (ref 0.0–0.2)

## 2018-08-05 LAB — CMP (CANCER CENTER ONLY)
ALT: 15 U/L (ref 0–44)
AST: 21 U/L (ref 15–41)
Albumin: 3.5 g/dL (ref 3.5–5.0)
Alkaline Phosphatase: 72 U/L (ref 38–126)
Anion gap: 9 (ref 5–15)
BUN: 14 mg/dL (ref 8–23)
CO2: 26 mmol/L (ref 22–32)
Calcium: 9.4 mg/dL (ref 8.9–10.3)
Chloride: 104 mmol/L (ref 98–111)
Creatinine: 0.98 mg/dL (ref 0.44–1.00)
GFR, Est AFR Am: >60 mL/min (ref >60)
GFR, Estimated: >60 mL/min (ref >60)
Glucose, Bld: 92 mg/dL (ref 70–99)
Potassium: 4.1 mmol/L (ref 3.5–5.1)
Sodium: 139 mmol/L (ref 135–145)
Total Bilirubin: 0.3 mg/dL (ref 0.3–1.2)
Total Protein: 7.5 g/dL (ref 6.5–8.1)

## 2018-08-05 LAB — LACTATE DEHYDROGENASE: LDH: 155 U/L (ref 98–192)

## 2018-08-05 MED ORDER — IOHEXOL 300 MG/ML  SOLN
100.0000 mL | Freq: Once | INTRAMUSCULAR | Status: AC | PRN
  Administered 2018-08-05: 09:00:00 100 mL via INTRAVENOUS

## 2018-08-05 MED ORDER — SODIUM CHLORIDE (PF) 0.9 % IJ SOLN
INTRAMUSCULAR | Status: AC
  Filled 2018-08-05: qty 50

## 2018-08-05 NOTE — Progress Notes (Signed)
Patient Care Team: Esaw Grandchild, NP as PCP - General (Family Medicine) Laurence Spates, MD as Consulting Physician (Gastroenterology) Loletha Carrow Kirke Corin, MD as Consulting Physician (Gastroenterology) Elsie Saas, MD as Consulting Physician (Orthopedic Surgery)  DIAGNOSIS:    ICD-10-CM   1. Large B-cell lymphoma ([place])  C85.10     SUMMARY OF ONCOLOGIC HISTORY: Oncology History  Large B-cell lymphoma ([place])  01/05/2014 Initial Diagnosis   Left axillary lymph node biopsy: Aggressive large B-cell lymphoma high-grade without CD10 or CD5 expression, positive for CD20, BCL-2 and BCL 6 and Ki-67 of 100% (DD: DLBCL vs B cell lymphoma unclassified)   01/16/2014 - 05/01/2014 Chemotherapy   R CHOP chemotherapy 6   01/21/2014 PET scan   PET CT scan done after cycle one RCHOP (urgent treatment needed) hypermetabolic bulky left supraclavicular and axillary lymphadenopathy. PET activity in the bone marrow and spleen thought to be related to chemotherapy rather than disease. (Stage 2B)   05/29/2014 PET scan   Significant improvement, with reduction in size and activity of the left supraclavicular and left axillary lymph node ; reduced activity in the normal size spleen; and resolution of the diffuse hypermetabolic marrow activity.   02/02/2015 Imaging   Borderline left axillary lymph node same size, no abdominal or pelvic lymphadenopathy, hepatomegaly mild cirrhosis , improving right middle lobe airspace disease. No evidence of lymphoma   07/31/2016 Imaging   CT CAP; no findings of lymphoma, index left axillary lymph node 9 mm within normal limits     CHIEF COMPLIANT: Follow-up of diffuse large B-cell lymphoma  INTERVAL HISTORY: Rachael West is a 63 y.o. with above-mentioned history of diffuse large B cell lymphoma who is in complete remission. CT CAP on 08/05/18 showed no evidence of recurrent disease and no change in 55m ground-glass nodule in the left lower lobe. Rachael West presents to the  clinic today for annual follow-up.  Rachael West denies any fevers chills night sweats or weight loss.  Denies any diarrhea constipation.  Rachael West has gastritis in the past and has taken Prevacid in the past as well.  Rachael West complains of mild nausea feeling intermittently.  REVIEW OF SYSTEMS:   Constitutional: Denies fevers, chills or abnormal weight loss Eyes: Denies blurriness of vision Ears, nose, mouth, throat, and face: Denies mucositis or sore throat Respiratory: Denies cough, dyspnea or wheezes Cardiovascular: Denies palpitation, chest discomfort Gastrointestinal: Mild nausea Skin: Denies abnormal skin rashes Lymphatics: Denies new lymphadenopathy or easy bruising Neurological: Denies numbness, tingling or new weaknesses Behavioral/Psych: Mood is stable, no new changes  Extremities: No lower extremity edema Breast: denies any pain or lumps or nodules in either breasts All other systems were reviewed with the patient and are negative.  I have reviewed the past medical history, past surgical history, social history and family history with the patient and they are unchanged from previous note.  ALLERGIES:  is allergic to latex; penicillins; codeine; cortisone; and sulfa antibiotics.  MEDICATIONS:  Current Outpatient Medications  Medication Sig Dispense Refill  . atenolol (TENORMIN) 25 MG tablet Take 1 tablet (25 mg total) by mouth daily. 90 tablet 3  . busPIRone (BUSPAR) 5 MG tablet Take 1 tablet (5 mg total) by mouth 2 (two) times daily. (Patient taking differently: Take 5 mg by mouth 3 (three) times daily. )    . [START ON 08/08/2018] Calcium Carbonate-Vit D-Min (GNP CALCIUM 1200) 1200-1000 MG-UNIT CHEW Chew 1,200 mg by mouth daily with breakfast. Take in combination with vitamin D and magnesium. 30 tablet 5  .  cyclobenzaprine (FLEXERIL) 5 MG tablet Take 1 tablet by mouth at bedtime.  3  . dimenhyDRINATE (DRAMAMINE) 50 MG tablet Take 50 mg by mouth every 8 (eight) hours as needed.    . hydrOXYzine  (VISTARIL) 50 MG capsule Take 50 mg by mouth at bedtime.    Marland Kitchen ipratropium-albuterol (DUONEB) 0.5-2.5 (3) MG/3ML SOLN Inhale 3 mLs into the lungs See admin instructions. 360 mL 5  . latanoprost (XALATAN) 0.005 % ophthalmic solution Place 1 drop into both eyes daily.    Marland Kitchen linaclotide (LINZESS) 72 MCG capsule Take 1 capsule (72 mcg total) by mouth daily before breakfast. 30 capsule   . lisinopril (PRINIVIL,ZESTRIL) 5 MG tablet Take 1 tablet (5 mg total) by mouth daily. 90 tablet 3  . lubiprostone (AMITIZA) 24 MCG capsule Take 1 capsule (24 mcg total) by mouth 2 (two) times daily with a meal. Swallow the medication whole. Do not break or chew the medication. 60 capsule 5  . [START ON 08/08/2018] Magnesium 500 MG CAPS Take 1 capsule (500 mg total) by mouth 2 (two) times daily at 8 am and 10 pm. 180 capsule 1  . meloxicam (MOBIC) 15 MG tablet Take 15 mg by mouth daily.  3  . montelukast (SINGULAIR) 10 MG tablet TAKE 1 TABLET BY MOUTH EVERY DAY 90 tablet 3  . omeprazole (PRILOSEC) 40 MG capsule 1 CAPSULE BY MOUTH DAILY  3  . paliperidone (INVEGA) 6 MG 24 hr tablet Take 6 mg by mouth every morning.    . polyethylene glycol (MIRALAX / GLYCOLAX) packet Take 17 g by mouth 2 (two) times daily.    . prazosin (MINIPRESS) 1 MG capsule Take 1 mg by mouth at bedtime.    Marland Kitchen PROAIR HFA 108 (90 Base) MCG/ACT inhaler INHALE 2 PUFFS INTO THE LUNGS 4 (FOUR) TIMES DAILY. 18 g 5  . sertraline (ZOLOFT) 100 MG tablet Take 200 mg by mouth every morning.    Derrill Memo ON 08/08/2018] traMADol (ULTRAM) 50 MG tablet Take 2 tablets (100 mg total) by mouth every 6 (six) hours as needed for severe pain. Must last 30 days 240 tablet 5  . traZODone (DESYREL) 50 MG tablet 1 TABLET BY MOUTH AT BEDTIME  5  . VESICARE 10 MG tablet Take 10 mg by mouth daily.  3  . Wheat Dextrin (BENEFIBER) POWD Take 6 g by mouth 3 (three) times daily before meals. (2 tsp = 6 g) 730 g 5   No current facility-administered medications for this visit.      PHYSICAL EXAMINATION: ECOG PERFORMANCE STATUS: 1 - Symptomatic but completely ambulatory  Vitals:   08/06/18 1043  BP: 122/79  Pulse: 74  Resp: 18  Temp: 98.5 F (36.9 C)  SpO2: 98%   Filed Weights   08/06/18 1043  Weight: 205 lb 4.8 oz (93.1 kg)    GENERAL: alert, no distress and comfortable SKIN: skin color, texture, turgor are normal, no rashes or significant lesions EYES: normal, Conjunctiva are pink and non-injected, sclera clear OROPHARYNX: no exudate, no erythema and lips, buccal mucosa, and tongue normal  NECK: supple, thyroid normal size, non-tender, without nodularity LYMPH: no palpable lymphadenopathy in the cervical, axillary or inguinal LUNGS: clear to auscultation and percussion with normal breathing effort HEART: regular rate & rhythm and no murmurs and no lower extremity edema ABDOMEN: abdomen soft, non-tender and normal bowel sounds MUSCULOSKELETAL: no cyanosis of digits and no clubbing  NEURO: alert & oriented x 3 with fluent speech, no focal motor/sensory deficits EXTREMITIES: No  lower extremity edema   LABORATORY DATA:  I have reviewed the data as listed CMP Latest Ref Rng & Units 08/05/2018 05/29/2018 08/29/2017  Glucose 70 - 99 mg/dL 92 89 82  BUN 8 - 23 mg/dL 14 8 8   Creatinine 0.44 - 1.00 mg/dL 0.98 0.86 0.84  Sodium 135 - 145 mmol/L 139 138 138  Potassium 3.5 - 5.1 mmol/L 4.1 4.7 4.5  Chloride 98 - 111 mmol/L 104 101 101  CO2 22 - 32 mmol/L 26 25 -  Calcium 8.9 - 10.3 mg/dL 9.4 9.1 8.7  Total Protein 6.5 - 8.1 g/dL 7.5 6.9 7.4  Total Bilirubin 0.3 - 1.2 mg/dL 0.3 0.2 0.4  Alkaline Phos 38 - 126 U/L 72 82 80  AST 15 - 41 U/L 21 24 26   ALT 0 - 44 U/L 15 13 -    Lab Results  Component Value Date   WBC 5.3 08/05/2018   HGB 12.4 08/05/2018   HCT 40.0 08/05/2018   MCV 81.5 08/05/2018   PLT 198 08/05/2018   NEUTROABS 2.1 08/05/2018    ASSESSMENT & PLAN:  Large B-cell lymphoma ([facility]) 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  We will order the scans and follow-up after that.  I will call Rachael West with results of these tests.  Chronic hepatitis C:Liver function tests are adequate.  Return to clinic in1 yearfor follow-upwith labs and CT scans. Scans once a yearuntil 5 years which will be next year.    No orders of the defined types were placed in this encounter.  The patient has a good understanding of the overall plan. Rachael West agrees with it. Rachael West will call with any problems that may develop before the next visit here.  Nicholas Lose, MD 08/06/2018  Julious Oka Dorshimer am acting as scribe for Dr. Nicholas Lose.  I have reviewed the above documentation for accuracy and completeness, and I agree with the above.

## 2018-08-06 ENCOUNTER — Inpatient Hospital Stay (HOSPITAL_BASED_OUTPATIENT_CLINIC_OR_DEPARTMENT_OTHER): Payer: Medicare Other | Admitting: Hematology and Oncology

## 2018-08-06 ENCOUNTER — Other Ambulatory Visit: Payer: Self-pay

## 2018-08-06 ENCOUNTER — Inpatient Hospital Stay: Payer: Medicare Other

## 2018-08-06 DIAGNOSIS — Z8572 Personal history of non-Hodgkin lymphomas: Secondary | ICD-10-CM | POA: Diagnosis not present

## 2018-08-06 DIAGNOSIS — B182 Chronic viral hepatitis C: Secondary | ICD-10-CM

## 2018-08-06 DIAGNOSIS — Z79899 Other long term (current) drug therapy: Secondary | ICD-10-CM

## 2018-08-06 DIAGNOSIS — Z9221 Personal history of antineoplastic chemotherapy: Secondary | ICD-10-CM

## 2018-08-06 DIAGNOSIS — C8510 Unspecified B-cell lymphoma, unspecified site: Secondary | ICD-10-CM

## 2018-08-07 ENCOUNTER — Telehealth: Payer: Self-pay | Admitting: Hematology and Oncology

## 2018-08-07 ENCOUNTER — Encounter: Payer: Medicare Other | Admitting: Pain Medicine

## 2018-08-07 NOTE — Telephone Encounter (Signed)
I talk with patient regarding schedule  

## 2018-08-17 ENCOUNTER — Other Ambulatory Visit: Payer: Self-pay | Admitting: Pain Medicine

## 2018-08-17 DIAGNOSIS — E559 Vitamin D deficiency, unspecified: Secondary | ICD-10-CM

## 2018-08-27 ENCOUNTER — Ambulatory Visit
Admission: RE | Admit: 2018-08-27 | Discharge: 2018-08-27 | Disposition: A | Discharge status: Home / Self Care | Payer: Medicare Other | Source: Ambulatory Visit | Attending: Pain Medicine | Admitting: Pain Medicine

## 2018-08-27 ENCOUNTER — Encounter: Payer: Self-pay | Admitting: Pain Medicine

## 2018-08-27 ENCOUNTER — Ambulatory Visit (HOSPITAL_BASED_OUTPATIENT_CLINIC_OR_DEPARTMENT_OTHER): Payer: Medicare Other | Admitting: Pain Medicine

## 2018-08-27 ENCOUNTER — Other Ambulatory Visit: Payer: Self-pay

## 2018-08-27 VITALS — BP 118/69 | HR 74 | Temp 97.8°F | Resp 18 | Ht 65.0 in | Wt 209.0 lb

## 2018-08-27 DIAGNOSIS — M5442 Lumbago with sciatica, left side: Secondary | ICD-10-CM | POA: Diagnosis present

## 2018-08-27 DIAGNOSIS — G8929 Other chronic pain: Secondary | ICD-10-CM

## 2018-08-27 DIAGNOSIS — M47816 Spondylosis without myelopathy or radiculopathy, lumbar region: Secondary | ICD-10-CM

## 2018-08-27 DIAGNOSIS — M5136 Other intervertebral disc degeneration, lumbar region: Secondary | ICD-10-CM

## 2018-08-27 DIAGNOSIS — Z91040 Latex allergy status: Secondary | ICD-10-CM

## 2018-08-27 MED ORDER — FENTANYL CITRATE (PF) 100 MCG/2ML IJ SOLN
25.0000 ug | INTRAMUSCULAR | Status: DC | PRN
  Administered 2018-08-27: 10:00:00 50 ug via INTRAVENOUS
  Filled 2018-08-27: qty 2

## 2018-08-27 MED ORDER — MIDAZOLAM HCL 5 MG/5ML IJ SOLN
1.0000 mg | INTRAMUSCULAR | Status: DC | PRN
  Administered 2018-08-27: 11:00:00 2 mg via INTRAVENOUS
  Filled 2018-08-27: qty 5

## 2018-08-27 MED ORDER — LIDOCAINE HCL 2 % IJ SOLN
20.0000 mL | Freq: Once | INTRAMUSCULAR | Status: AC
  Administered 2018-08-27: 11:00:00 400 mg
  Filled 2018-08-27: qty 20

## 2018-08-27 MED ORDER — TRIAMCINOLONE ACETONIDE 40 MG/ML IJ SUSP
80.0000 mg | Freq: Once | INTRAMUSCULAR | Status: AC
  Administered 2018-08-27: 40 mg
  Filled 2018-08-27: qty 2

## 2018-08-27 MED ORDER — LACTATED RINGERS IV SOLN
1000.0000 mL | Freq: Once | INTRAVENOUS | Status: AC
  Administered 2018-08-27: 10:00:00 1000 mL via INTRAVENOUS

## 2018-08-27 MED ORDER — ROPIVACAINE HCL 2 MG/ML IJ SOLN
18.0000 mL | Freq: Once | INTRAMUSCULAR | Status: AC
  Administered 2018-08-27: 11:00:00 10 mL via PERINEURAL
  Filled 2018-08-27: qty 20

## 2018-08-27 NOTE — Progress Notes (Signed)
Safety precautions to be maintained throughout the outpatient stay will include: orient to surroundings, keep bed in low position, maintain call bell within reach at all times, provide assistance with transfer out of bed and ambulation.  

## 2018-08-27 NOTE — Progress Notes (Signed)
Patient's Name: Rachael West  MRN: [id number]  Referring Provider: Esaw Grandchild, NP  DOB: [birth year]  PCP: Esaw Grandchild, NP  DOS: 08/27/2018  Note by: Gaspar Cola, MD  Service setting: Ambulatory outpatient  Specialty: Interventional Pain Management  Patient type: Established  Location: [facility] (AMB) Pain Management Facility  Visit type: Interventional Procedure   Primary Reason for Visit: Interventional Pain Management Treatment. CC: Back Pain (low)  Procedure:          Anesthesia, Analgesia, Anxiolysis:  Type: Lumbar Facet, Medial Branch Block(s) #2  Primary Purpose: Diagnostic Region: Posterolateral Lumbosacral Spine Level: L2, L3, L4, L5, & S1 Medial Branch Level(s). Injecting these levels blocks the L3-4, L4-5, and L5-S1 lumbar facet joints. Laterality: Bilateral  Type: Moderate (Conscious) Sedation combined with Local Anesthesia Indication(s): Analgesia and Anxiety Route: Intravenous (IV) IV Access: Secured Sedation: Meaningful verbal contact was maintained at all times during the procedure  Local Anesthetic: Lidocaine 1-2%  Position: Prone   Indications: 1. Spondylosis without myelopathy or radiculopathy, lumbar region   2. Lumbar facet syndrome (Bilateral) (L>R)   3. Lumbar facet arthropathy   4. DDD (degenerative disc disease), lumbar   5. Chronic low back pain (Primary Area of Pain) (Bilateral) w/ sciatica (Left)   6. Latex precautions, history of latex allergy    Pain Score: Pre-procedure: 4 /10 Post-procedure: 0-No pain/10   Pre-op Assessment:  Rachael West is a 63 y.o. (year old), female patient, seen today for interventional treatment. She  has a past surgical history that includes Appendectomy; Trigger finger release; Esophagogastroduodenoscopy (01/17/2011); Colonoscopy (01/17/2011); Abdominal hysterectomy; and Breast biopsy (Left, 2015). Rachael West has a current medication list which includes the following prescription(s): atenolol,  buspirone, gnp calcium 1200, cyclobenzaprine, dimenhydrinate, hydroxyzine, ipratropium-albuterol, latanoprost, linaclotide, lisinopril, lubiprostone, magnesium, meloxicam, montelukast, omeprazole, paliperidone, polyethylene glycol, prazosin, proair hfa, sertraline, tramadol, trazodone, vesicare, and benefiber, and the following Facility-Administered Medications: fentanyl and midazolam. Her primarily concern today is the Back Pain (low)  Initial Vital Signs:  Pulse/HCG Rate: 74ECG Heart Rate: 70 Temp: 98.7 F (37.1 C) Resp: 18 BP: 123/70 SpO2: 98 %  BMI: Estimated body mass index is 34.78 kg/m as calculated from the following:   Height as of this encounter: 5\' 5"  (1.651 m).   Weight as of this encounter: 209 lb (94.8 kg).  Risk Assessment: Allergies: Reviewed. She is allergic to latex; penicillins; codeine; cortisone; and sulfa antibiotics.  Allergy Precautions: None required Coagulopathies: Reviewed. None identified.  Blood-thinner therapy: None at this time Active Infection(s): Reviewed. None identified. Rachael West is afebrile  Site Confirmation: Rachael West was asked to confirm the procedure and laterality before marking the site Procedure checklist: Completed Consent: Before the procedure and under the influence of no sedative(s), amnesic(s), or anxiolytics, the patient was informed of the treatment options, risks and possible complications. To fulfill our ethical and legal obligations, as recommended by the American Medical Association's Code of Ethics, I have informed the patient of my clinical impression; the nature and purpose of the treatment or procedure; the risks, benefits, and possible complications of the intervention; the alternatives, including doing nothing; the risk(s) and benefit(s) of the alternative treatment(s) or procedure(s); and the risk(s) and benefit(s) of doing nothing. The patient was provided information about the general risks and possible  complications associated with the procedure. These may include, but are not limited to: failure to achieve desired goals, infection, bleeding, organ or nerve damage, allergic reactions, paralysis, and death. In addition, the patient was informed of those  risks and complications associated to Spine-related procedures, such as failure to decrease pain; infection (i.e.: Meningitis, epidural or intraspinal abscess); bleeding (i.e.: epidural hematoma, subarachnoid hemorrhage, or any other type of intraspinal or peri-dural bleeding); organ or nerve damage (i.e.: Any type of peripheral nerve, nerve root, or spinal cord injury) with subsequent damage to sensory, motor, and/or autonomic systems, resulting in permanent pain, numbness, and/or weakness of one or several areas of the body; allergic reactions; (i.e.: anaphylactic reaction); and/or death. Furthermore, the patient was informed of those risks and complications associated with the medications. These include, but are not limited to: allergic reactions (i.e.: anaphylactic or anaphylactoid reaction(s)); adrenal axis suppression; blood sugar elevation that in diabetics may result in ketoacidosis or comma; water retention that in patients with history of congestive heart failure may result in shortness of breath, pulmonary edema, and decompensation with resultant heart failure; weight gain; swelling or edema; medication-induced neural toxicity; particulate matter embolism and blood vessel occlusion with resultant organ, and/or nervous system infarction; and/or aseptic necrosis of one or more joints. Finally, the patient was informed that Medicine is not an exact science; therefore, there is also the possibility of unforeseen or unpredictable risks and/or possible complications that may result in a catastrophic outcome. The patient indicated having understood very clearly. We have given the patient no guarantees and we have made no promises. Enough time was given to the  patient to ask questions, all of which were answered to the patient's satisfaction. Rachael West has indicated that she wanted to continue with the procedure. Attestation: I, the ordering provider, attest that I have discussed with the patient the benefits, risks, side-effects, alternatives, likelihood of achieving goals, and potential problems during recovery for the procedure that I have provided informed consent. Date   Time: 08/27/2018  9:10 AM  Pre-Procedure Preparation:  Monitoring: As per clinic protocol. Respiration, ETCO2, SpO2, BP, heart rate and rhythm monitor placed and checked for adequate function Safety Precautions: Patient was assessed for positional comfort and pressure points before starting the procedure. Time-out: I initiated and conducted the "Time-out" before starting the procedure, as per protocol. The patient was asked to participate by confirming the accuracy of the "Time Out" information. Verification of the correct person, site, and procedure were performed and confirmed by me, the nursing staff, and the patient. "Time-out" conducted as per Joint Commission's Universal Protocol ([email or web address].01.01.01). Time: 1013  Description of Procedure:          Laterality: Bilateral. The procedure was performed in identical fashion on both sides. Levels:  L2, L3, L4, L5, & S1 Medial Branch Level(s) Area Prepped: Posterior Lumbosacral Region Prepping solution: DuraPrep (Iodine Povacrylex [0.7% available iodine] and Isopropyl Alcohol, 74% w/w) Safety Precautions: Aspiration looking for blood return was conducted prior to all injections. At no point did we inject any substances, as a needle was being advanced. Before injecting, the patient was told to immediately notify me if she was experiencing any new onset of "ringing in the ears, or metallic taste in the mouth". No attempts were made at seeking any paresthesias. Safe injection practices and needle disposal techniques used. Medications  properly checked for expiration dates. SDV (single dose vial) medications used. After the completion of the procedure, all disposable equipment used was discarded in the proper designated medical waste containers. Local Anesthesia: Protocol guidelines were followed. The patient was positioned over the fluoroscopy table. The area was prepped in the usual manner. The time-out was completed. The target area was identified using fluoroscopy. A 12-in  long, straight, sterile hemostat was used with fluoroscopic guidance to locate the targets for each level blocked. Once located, the skin was marked with an approved surgical skin marker. Once all sites were marked, the skin (epidermis, dermis, and hypodermis), as well as deeper tissues (fat, connective tissue and muscle) were infiltrated with a small amount of a short-acting local anesthetic, loaded on a 10cc syringe with a 25G, 1.5-in  Needle. An appropriate amount of time was allowed for local anesthetics to take effect before proceeding to the next step. Local Anesthetic: Lidocaine 2.0% The unused portion of the local anesthetic was discarded in the proper designated containers. Technical explanation of process:  L2 Medial Branch Nerve Block (MBB): The target area for the L2 medial branch is at the junction of the postero-lateral aspect of the superior articular process and the superior, posterior, and medial edge of the transverse process of L3. Under fluoroscopic guidance, a Quincke needle was inserted until contact was made with os over the superior postero-lateral aspect of the pedicular shadow (target area). After negative aspiration for blood, 0.5 mL of the nerve block solution was injected without difficulty or complication. The needle was removed intact. L3 Medial Branch Nerve Block (MBB): The target area for the L3 medial branch is at the junction of the postero-lateral aspect of the superior articular process and the superior, posterior, and medial edge of  the transverse process of L4. Under fluoroscopic guidance, a Quincke needle was inserted until contact was made with os over the superior postero-lateral aspect of the pedicular shadow (target area). After negative aspiration for blood, 0.5 mL of the nerve block solution was injected without difficulty or complication. The needle was removed intact. L4 Medial Branch Nerve Block (MBB): The target area for the L4 medial branch is at the junction of the postero-lateral aspect of the superior articular process and the superior, posterior, and medial edge of the transverse process of L5. Under fluoroscopic guidance, a Quincke needle was inserted until contact was made with os over the superior postero-lateral aspect of the pedicular shadow (target area). After negative aspiration for blood, 0.5 mL of the nerve block solution was injected without difficulty or complication. The needle was removed intact. L5 Medial Branch Nerve Block (MBB): The target area for the L5 medial branch is at the junction of the postero-lateral aspect of the superior articular process and the superior, posterior, and medial edge of the sacral ala. Under fluoroscopic guidance, a Quincke needle was inserted until contact was made with os over the superior postero-lateral aspect of the pedicular shadow (target area). After negative aspiration for blood, 0.5 mL of the nerve block solution was injected without difficulty or complication. The needle was removed intact. S1 Medial Branch Nerve Block (MBB): The target area for the S1 medial branch is at the posterior and inferior 6 o'clock position of the L5-S1 facet joint. Under fluoroscopic guidance, the Quincke needle inserted for the L5 MBB was redirected until contact was made with os over the inferior and postero aspect of the sacrum, at the 6 o' clock position under the L5-S1 facet joint (Target area). After negative aspiration for blood, 0.5 mL of the nerve block solution was injected without  difficulty or complication. The needle was removed intact.  Nerve block solution: 0.2% PF-Ropivacaine + Triamcinolone (40 mg/mL) diluted to a final concentration of 4 mg of Triamcinolone/mL of Ropivacaine The unused portion of the solution was discarded in the proper designated containers. Procedural Needles: 22-gauge, 3.5-inch, Quincke needles used  for all levels.  Once the entire procedure was completed, the treated area was cleaned, making sure to leave some of the prepping solution back to take advantage of its long term bactericidal properties.   Illustration of the posterior view of the lumbar spine and the posterior neural structures. Laminae of L2 through S1 are labeled. DPRL5, dorsal primary ramus of L5; DPRS1, dorsal primary ramus of S1; DPR3, dorsal primary ramus of L3; FJ, facet (zygapophyseal) joint L3-L4; I, inferior articular process of L4; LB1, lateral branch of dorsal primary ramus of L1; IAB, inferior articular branches from L3 medial branch (supplies L4-L5 facet joint); IBP, intermediate branch plexus; MB3, medial branch of dorsal primary ramus of L3; NR3, third lumbar nerve root; S, superior articular process of L5; SAB, superior articular branches from L4 (supplies L4-5 facet joint also); TP3, transverse process of L3.  Vitals:   08/27/18 1020 08/27/18 1026 08/27/18 1036 08/27/18 1046  BP: 110/71 111/66 104/69 111/70  Pulse:      Resp: 16 17 20 18   Temp:   97.7 F (36.5 C)   TempSrc:      SpO2: 100% 100% 95% 95%  Weight:      Height:         Start Time: 1013 hrs. End Time: 1022 hrs.  Imaging Guidance (Spinal):          Type of Imaging Technique: Fluoroscopy Guidance (Spinal) Indication(s): Assistance in needle guidance and placement for procedures requiring needle placement in or near specific anatomical locations not easily accessible without such assistance. Exposure Time: Please see nurses notes. Contrast: None used. Fluoroscopic Guidance: I was personally  present during the use of fluoroscopy. "Tunnel Vision Technique" used to obtain the best possible view of the target area. Parallax error corrected before commencing the procedure. "Direction-depth-direction" technique used to introduce the needle under continuous pulsed fluoroscopy. Once target was reached, antero-posterior, oblique, and lateral fluoroscopic projection used confirm needle placement in all planes. Images permanently stored in EMR. Interpretation: No contrast injected. I personally interpreted the imaging intraoperatively. Adequate needle placement confirmed in multiple planes. Permanent images saved into the patient's record.  Antibiotic Prophylaxis:   Anti-infectives (From admission, onward)   None     Indication(s): None identified  Post-operative Assessment:  Post-procedure Vital Signs:  Pulse/HCG Rate: 7479 Temp: 97.7 F (36.5 C) Resp: 18 BP: 111/70 SpO2: 95 %  EBL: None  Complications: No immediate post-treatment complications observed by team, or reported by patient.  Note: The patient tolerated the entire procedure well. A repeat set of vitals were taken after the procedure and the patient was kept under observation following institutional policy, for this type of procedure. Post-procedural neurological assessment was performed, showing return to baseline, prior to discharge. The patient was provided with post-procedure discharge instructions, including a section on how to identify potential problems. Should any problems arise concerning this procedure, the patient was given instructions to immediately contact us, at any time, without hesitation. In any case, we plan to contact the patient by telephone for a follow-up status report regarding this interventional procedure.  Comments:  No additional relevant information.  Plan of Care  Orders:  Orders Placed This Encounter  Procedures   LUMBAR FACET(MEDIAL BRANCH NERVE BLOCK) MBNB    Scheduling Instructions:      Side: Bilateral     Level: L3-4, L4-5, & L5-S1 Facets (L2, L3, L4, L5, & S1 Medial Branch Nerves)     Sedation: With Sedation.     Timeframe: Today  Order Specific Question:   Where will this procedure be performed?    Answer:   [facility]   DG PAIN [facility] C-ARM 1-60 MIN NO REPORT    Intraoperative interpretation by procedural physician at [place].    Standing Status:   Standing    Number of Occurrences:   1    Order Specific Question:   Reason for exam:    Answer:   Assistance in needle guidance and placement for procedures requiring needle placement in or near specific anatomical locations not easily accessible without such assistance.   Provider attestation of informed consent for procedure/surgical case    I, the ordering provider, attest that I have discussed with the patient the benefits, risks, side effects, alternatives, likelihood of achieving goals and potential problems during recovery for the procedure that I have provided informed consent.    Standing Status:   Standing    Number of Occurrences:   1   Informed Consent Details: Transcribe to consent form and obtain patient signature    Standing Status:   Standing    Number of Occurrences:   1    Order Specific Question:   Procedure    Answer:   Bilateral Lumbar facet block (medial branch block) under fluoroscopic guidance. (See notes for levels.)    Order Specific Question:   Surgeon    Answer:   Cabria Micalizzi A. Dossie Arbour, MD    Order Specific Question:   Indication/Reason    Answer:   Bilateral low back pain with or without lower extremity pain   Latex precautions    Activate Latex-Free Protocol.    Standing Status:   Standing    Number of Occurrences:   1   Chronic Opioid Analgesic:  Tramadol50 mg,2tabs (100 mg) PO q 6 hrs (400 mg/day of tramadol) MME/day:40mg /day.   Medications ordered for procedure: Meds ordered this encounter  Medications   lidocaine (XYLOCAINE) 2 % (with pres)  injection 400 mg   lactated ringers infusion 1,000 mL   midazolam (VERSED) 5 MG/5ML injection 1-2 mg    Make sure Flumazenil is available in the pyxis when using this medication. If oversedation occurs, administer 0.2 mg IV over 15 sec. If after 45 sec no response, administer 0.2 mg again over 1 min; may repeat at 1 min intervals; not to exceed 4 doses (1 mg)   fentaNYL (SUBLIMAZE) injection 25-50 mcg    Make sure Narcan is available in the pyxis when using this medication. In the event of respiratory depression (RR< 8/min): Titrate NARCAN (naloxone) in increments of 0.1 to 0.2 mg IV at 2-3 minute intervals, until desired degree of reversal.   ropivacaine (PF) 2 mg/mL (0.2%) (NAROPIN) injection 18 mL   triamcinolone acetonide (KENALOG-40) injection 80 mg   Medications administered: We administered lidocaine, lactated ringers, midazolam, fentaNYL, ropivacaine (PF) 2 mg/mL (0.2%), and triamcinolone acetonide.  See the medical record for exact dosing, route, and time of administration.  Follow-up plan:   Return for (VV), 2 wk PP-F/U Eval.       Interventional management options:  Considering:   NOTE: LATEX ALLERGY & Hx Hepatitis C (w/ cirrhosis) Diagnostic left LESI Possible bilateral lumbar facetRFA Diagnostic bilateral thoracic facet block Possiblebilateral thoracic facetRFA Possible bilateral cervical facetRFA Diagnostic bilateral GONB Diagnostic trigger point injections   Palliative PRN treatment(s):   Palliative/diagnosticbilateral lumbar facet block#3 Palliative/diagnostic bilateralcervicalfacet block#2     Recent Visits Date Type Provider Dept  07/31/18 Office Visit Milinda Pointer, MD [facility]  Showing recent visits within past 90 days and meeting all other requirements   Today's Visits Date Type Provider Dept  08/27/18 Procedure visit Milinda Pointer, MD [facility]  Showing today's visits and meeting all other requirements     Future Appointments Date Type Provider Dept  09/11/18 Appointment Milinda Pointer, MD [facility]  Showing future appointments within next 90 days and meeting all other requirements   Disposition: Discharge home  Discharge Date & Time: 08/27/2018; 1100 hrs.   Primary Care Physician: Esaw Grandchild, NP Location: [facility] Note by: Gaspar Cola, MD Date: 08/27/2018; Time: 10:52 AM  Disclaimer:  Medicine is not an Chief Strategy Officer. The only guarantee in medicine is that nothing is guaranteed. It is important to note that the decision to proceed with this intervention was based on the information collected from the patient. The Data and conclusions were drawn from the patient's questionnaire, the interview, and the physical examination. Because the information was provided in large part by the patient, it cannot be guaranteed that it has not been purposely or unconsciously manipulated. Every effort has been made to obtain as much relevant data as possible for this evaluation. It is important to note that the conclusions that lead to this procedure are derived in large part from the available data. Always take into account that the treatment will also be dependent on availability of resources and existing treatment guidelines, considered by other Pain Management Practitioners as being common knowledge and practice, at the time of the intervention. For Medico-Legal purposes, it is also important to point out that variation in procedural techniques and pharmacological choices are the acceptable norm. The indications, contraindications, technique, and results of the above procedure should only be interpreted and judged by a Board-Certified Interventional Pain Specialist with extensive familiarity and expertise in the same exact procedure and technique.

## 2018-08-27 NOTE — Patient Instructions (Addendum)
____________________________________________________________________________________________  Post-Procedure Discharge Instructions  Instructions:  Apply ice:   Purpose: This will minimize any swelling and discomfort after procedure.   When: Day of procedure, as soon as you get home.  How: Fill a plastic sandwich bag with crushed ice. Cover it with a small towel and apply to injection site.  How long: (15 min on, 15 min off) Apply for 15 minutes then remove x 15 minutes.  Repeat sequence on day of procedure, until you go to bed.  Apply heat:   Purpose: To treat any soreness and discomfort from the procedure.  When: Starting the next day after the procedure.  How: Apply heat to procedure site starting the day following the procedure.  How long: May continue to repeat daily, until discomfort goes away.  Food intake: Start with clear liquids (like water) and advance to regular food, as tolerated.   Physical activities: Keep activities to a minimum for the first 8 hours after the procedure. After that, then as tolerated.  Driving: If you have received any sedation, be responsible and do not drive. You are not allowed to drive for 24 hours after having sedation.  Blood thinner: (Applies only to those taking blood thinners) You may restart your blood thinner 6 hours after your procedure.  Insulin: (Applies only to Diabetic patients taking insulin) As soon as you can eat, you may resume your normal dosing schedule.  Infection prevention: Keep procedure site clean and dry. Shower daily and clean area with soap and water.  Post-procedure Pain Diary: Extremely important that this be done correctly and accurately. Recorded information will be used to determine the next step in treatment. For the purpose of accuracy, follow these rules:  Evaluate only the area treated. Do not report or include pain from an untreated area. For the purpose of this evaluation, ignore all other areas of pain,  except for the treated area.  After your procedure, avoid taking a long nap and attempting to complete the pain diary after you wake up. Instead, set your alarm clock to go off every hour, on the hour, for the initial 8 hours after the procedure. Document the duration of the numbing medicine, and the relief you are getting from it.  Do not go to sleep and attempt to complete it later. It will not be accurate. If you received sedation, it is likely that you were given a medication that may cause amnesia. Because of this, completing the diary at a later time may cause the information to be inaccurate. This information is needed to plan your care.  Follow-up appointment: Keep your post-procedure follow-up evaluation appointment after the procedure (usually 2 weeks for most procedures, 6 weeks for radiofrequencies). DO NOT FORGET to bring you pain diary with you.   Expect: (What should I expect to see with my procedure?)  From numbing medicine (AKA: Local Anesthetics): Numbness or decrease in pain. You may also experience some weakness, which if present, could last for the duration of the local anesthetic.  Onset: Full effect within 15 minutes of injected.  Duration: It will depend on the type of local anesthetic used. On the average, 1 to 8 hours.   From steroids (Applies only if steroids were used): Decrease in swelling or inflammation. Once inflammation is improved, relief of the pain will follow.  Onset of benefits: Depends on the amount of swelling present. The more swelling, the longer it will take for the benefits to be seen. In some cases, up to 10 days.    Duration: Steroids will stay in the system x 2 weeks. Duration of benefits will depend on multiple posibilities including persistent irritating factors.  Side-effects: If present, they may typically last 2 weeks (the duration of the steroids).  Frequent: Cramps (if they occur, drink Gatorade and take over-the-counter Magnesium 450-500 mg  once to twice a day); water retention with temporary weight gain; increases in blood sugar; decreased immune system response; increased appetite.  Occasional: Facial flushing (red, warm cheeks); mood swings; menstrual changes.  Uncommon: Long-term decrease or suppression of natural hormones; bone thinning. (These are more common with higher doses or more frequent use. This is why we prefer that our patients avoid having any injection therapies in other practices.)   Very Rare: Severe mood changes; psychosis; aseptic necrosis.  From procedure: Some discomfort is to be expected once the numbing medicine wears off. This should be minimal if ice and heat are applied as instructed.  Call if: (When should I call?)  You experience numbness and weakness that gets worse with time, as opposed to wearing off.  New onset bowel or bladder incontinence. (Applies only to procedures done in the spine)  Emergency Numbers:  Durning business hours (Monday - Thursday, 8:00 AM - 4:00 PM) (Friday, 9:00 AM - 12:00 Noon): [phone]  After hours: [phone]  NOTE: If you are having a problem and are unable connect with, or to talk to a provider, then go to your nearest urgent care or emergency department. If the problem is serious and urgent, please call 911. ____________________________________________________________________________________________   Pain Management Discharge Instructions  General Discharge Instructions :  If you need to reach your doctor call: Monday-Friday 8:00 am - 4:00 pm at [phone] or toll free [phone].  After clinic hours [phone] to have operator reach doctor.  Bring all of your medication bottles to all your appointments in the pain clinic.  To cancel or reschedule your appointment with Pain Management please remember to call 24 hours in advance to avoid a fee.  Refer to the educational materials which you have been given on: General Risks, I had my  Procedure. Discharge Instructions, Post Sedation.  Post Procedure Instructions:  The drugs you were given will stay in your system until tomorrow, so for the next 24 hours you should not drive, make any legal decisions or drink any alcoholic beverages.  You may eat anything you prefer, but it is better to start with liquids then soups and crackers, and gradually work up to solid foods.  Please notify your doctor immediately if you have any unusual bleeding, trouble breathing or pain that is not related to your normal pain.  Depending on the type of procedure that was done, some parts of your body may feel week and/or numb.  This usually clears up by tonight or the next day.  Walk with the use of an assistive device or accompanied by an adult for the 24 hours.  You may use ice on the affected area for the first 24 hours.  Put ice in a Ziploc bag and cover with a towel and place against area 15 minutes on 15 minutes off.  You may switch to heat after 24 hours.Facet Blocks Patient Information  Description: The facets are joints in the spine between the vertebrae.  Like any joints in the body, facets can become irritated and painful.  Arthritis can also effect the facets.  By injecting steroids and local anesthetic in and around these joints, we can temporarily block the nerve   supply to them.  Steroids act directly on irritated nerves and tissues to reduce selling and inflammation which often leads to decreased pain.  Facet blocks may be done anywhere along the spine from the neck to the low back depending upon the location of your pain.   After numbing the skin with local anesthetic (like Novocaine), a small needle is passed onto the facet joints under x-ray guidance.  You may experience a sensation of pressure while this is being done.  The entire block usually lasts about 15-25 minutes.   Conditions which may be treated by facet blocks:   Low back/buttock pain  Neck/shoulder pain  Certain  types of headaches  Preparation for the injection:  1. Do not eat any solid food or dairy products within 8 hours of your appointment. 2. You may drink clear liquid up to 3 hours before appointment.  Clear liquids include water, black coffee, juice or soda.  No milk or cream please. 3. You may take your regular medication, including pain medications, with a sip of water before your appointment.  Diabetics should hold regular insulin (if taken separately) and take 1/2 normal NPH dose the morning of the procedure.  Carry some sugar containing items with you to your appointment. 4. A driver must accompany you and be prepared to drive you home after your procedure. 5. Bring all your current medications with you. 6. An IV may be inserted and sedation may be given at the discretion of the physician. 7. A blood pressure cuff, EKG and other monitors will often be applied during the procedure.  Some patients may need to have extra oxygen administered for a short period. 8. You will be asked to provide medical information, including your allergies and medications, prior to the procedure.  We must know immediately if you are taking blood thinners (like Coumadin/Warfarin) or if you are allergic to IV iodine contrast (dye).  We must know if you could possible be pregnant.  Possible side-effects:   Bleeding from needle site  Infection (rare, may require surgery)  Nerve injury (rare)  Numbness & tingling (temporary)  Difficulty urinating (rare, temporary)  Spinal headache (a headache worse with upright posture)  Light-headedness (temporary)  Pain at injection site (serveral days)  Decreased blood pressure (rare, temporary)  Weakness in arm/leg (temporary)  Pressure sensation in back/neck (temporary)   Call if you experience:   Fever/chills associated with headache or increased back/neck pain  Headache worsened by an upright position  New onset, weakness or numbness of an extremity  below the injection site  Hives or difficulty breathing (go to the emergency room)  Inflammation or drainage at the injection site(s)  Severe back/neck pain greater than usual  New symptoms which are concerning to you  Please note:  Although the local anesthetic injected can often make your back or neck feel good for several hours after the injection, the pain will likely return. It takes 3-7 days for steroids to work.  You may not notice any pain relief for at least one week.  If effective, we will often do a series of 2-3 injections spaced 3-6 weeks apart to maximally decrease your pain.  After the initial series, you may be a candidate for a more permanent nerve block of the facets.  If you have any questions, please call [phone] [facility] 

## 2018-08-28 ENCOUNTER — Telehealth: Payer: Self-pay | Admitting: *Deleted

## 2018-08-28 ENCOUNTER — Telehealth: Payer: Self-pay

## 2018-08-28 NOTE — Telephone Encounter (Signed)
States feeling good this morning. No issues post procedure.

## 2018-09-10 ENCOUNTER — Encounter: Payer: Self-pay | Admitting: Pain Medicine

## 2018-09-11 ENCOUNTER — Other Ambulatory Visit: Payer: Self-pay

## 2018-09-11 ENCOUNTER — Ambulatory Visit: Payer: Medicare Other | Admitting: Pain Medicine

## 2018-09-11 NOTE — Progress Notes (Deleted)
Pain Management Virtual Encounter Note - Virtual Visit via Telephone Telehealth (real-time audio visits between healthcare provider and patient).   Patient's Phone No. & Preferred Pharmacy:  [phone] (home); [phone] (mobile); (Preferred) [phone] [email or web address]  [id number], [place] [place] [address] [phone] [phone]    Pre-screening note:  Our staff contacted Rachael West and offered her an "in person", "face-to-face" appointment versus a telephone encounter. She indicated preferring the telephone encounter, at this time.   Reason for Virtual Visit: COVID-19*  Social distancing based on CDC and AMA recommendations.   I contacted Rachael West on 09/11/2018 via telephone.      I clearly identified myself as Gaspar Cola, MD. I verified that I was speaking with the correct person using two identifiers (Name: Rachael West, and date of birth: [birth year]).  Advanced Informed Consent I sought verbal advanced consent from Rachael West for virtual visit interactions. I informed Rachael West of possible security and privacy concerns, risks, and limitations associated with providing "not-in-person" medical evaluation and management services. I also informed Rachael West of the availability of "in-person" appointments. Finally, I informed her that there would be a charge for the virtual visit and that she could be  personally, fully or partially, financially responsible for it. [name] expressed understanding and agreed to proceed.   Historic Elements   Rachael West is a 63 y.o. year old, female patient evaluated today after her last encounter by our practice on 08/28/2018. Rachael West  has a past medical history of Anemia, Anxiety, Arthritis, Asthma, Depression, GERD (gastroesophageal reflux disease), Hepatitis-C, History of  chemotherapy, Hypertension, Insomnia, Lymphoma ([place]) (12/2013), Personal history of non-Hodgkin lymphomas, and Vitamin D deficiency. She also  has a past surgical history that includes Appendectomy; Trigger finger release; Esophagogastroduodenoscopy (01/17/2011); Colonoscopy (01/17/2011); Abdominal hysterectomy; and Breast biopsy (Left, 2015). Rachael West has a current medication list which includes the following prescription(s): atenolol, buspirone, gnp calcium 1200, cyclobenzaprine, dimenhydrinate, hydroxyzine, ipratropium-albuterol, latanoprost, linaclotide, lisinopril, lubiprostone, magnesium, meloxicam, montelukast, omeprazole, paliperidone, polyethylene glycol, prazosin, proair hfa, sertraline, tramadol, trazodone, vesicare, and benefiber. She  reports that she quit smoking about 28 years ago. Her smoking use included cigarettes. She has a 30.00 pack-year smoking history. She has never used smokeless tobacco. She reports that she does not drink alcohol or use drugs. Rachael West is allergic to latex; penicillins; codeine; cortisone; and sulfa antibiotics.   HPI  Today, she is being contacted for a post-procedure assessment.  Post-Procedure Evaluation  Procedure: Diagnostic bilateral lumbar facet block#2under fluoro and IV sedation Pre-procedure pain level:  4/10 Post-procedure: 0/10 (100% relief)  Sedation: Sedation provided.  Effectiveness during initial hour after procedure(Ultra-Short Term Relief): 100 %   Local anesthetic used: Long-acting (4-6 hours) Effectiveness: Defined as any analgesic benefit obtained secondary to the administration of local anesthetics. This carries significant diagnostic value as to the etiological location, or anatomical origin, of the pain. Duration of benefit is expected to coincide with the duration of the local anesthetic used.  Effectiveness during initial 4-6 hours after procedure(Short-Term Relief): 100 %   Long-term benefit: Defined as any relief  past the pharmacologic duration of the local anesthetics.  Effectiveness past the initial 6 hours after procedure(Long-Term Relief): 100 %   Current benefits: Defined as benefit that persist at this time.   Analgesia:  90-100% better Function: Rachael West reports improvement in function ROM: Rachael West reports improvement in ROM  Pharmacotherapy Assessment  Analgesic: Tramadol50 mg,2tabs (100 mg) PO q 6 hrs (400 mg/day of tramadol) MME/day:40mg /day.   Monitoring: Pharmacotherapy: No side-effects or adverse reactions reported. [place] PMP: PDMP reviewed during this encounter.       Compliance: No problems identified. Effectiveness: Clinically acceptable. Plan: Refer to "POC".  UDS:  Summary  Date Value Ref Range Status  02/06/2018 FINAL  Final    Comment:    ==================================================================== TOXASSURE SELECT 13 (MW) ==================================================================== Test                             Result       Flag       Units Drug Present and Declared for Prescription Verification   Tramadol                       >3759        EXPECTED   ng/mg creat   O-Desmethyltramadol            >3759        EXPECTED   ng/mg creat   N-Desmethyltramadol            >3759        EXPECTED   ng/mg creat    Source of tramadol is a prescription medication.    O-desmethyltramadol and N-desmethyltramadol are expected    metabolites of tramadol. ==================================================================== Test                      Result    Flag   Units      Ref Range   Creatinine              133              mg/dL      >=20 ==================================================================== Declared Medications:  The flagging and interpretation on this report are based on the  following declared medications.  Unexpected results may arise from  inaccuracies in the declared medications.  **Note: The testing scope of this  panel includes these medications:  Tramadol (Ultram)  **Note: The testing scope of this panel does not include following  reported medications:  Albuterol (Duoneb)  Albuterol (Proventil)  Atenolol  Calcium carbonate (Calcium Carb/Vitamin D)  Cyclobenzaprine (Flexeril)  Hydroxyzine (Vistaril)  Ipratropium (Duoneb)  Latanoprost (Xalatan)  Linaclotide (Linzess)  Lisinopril  Magnesium  Meloxicam (Mobic)  Montelukast (Singulair)  Omeprazole (Prilosec)  Ondansetron (Zofran)  Paliperidone (Invega)  Polyethylene Glycol (MiraLAX)  Prazosin (Minipress)  Sertraline (Zoloft)  Solifenacin (Vesicare)  Trazodone (Desyrel)  Vitamin D (Calcium Carb/Vitamin D) ==================================================================== For clinical consultation, please call [phone]. ====================================================================    Laboratory Chemistry Profile (12 mo)  Renal: 05/29/2018: BUN/Creatinine Ratio 9 08/05/2018: BUN 14; Creatinine 0.98  Lab Results  Component Value Date   GFRAA >60 08/05/2018   GFRNONAA >60 08/05/2018   Hepatic: 08/05/2018: Albumin 3.5 Lab Results  Component Value Date   AST 21 08/05/2018   ALT 15 08/05/2018   Other: No results found for requested labs within last 8760 hours. Note: Above Lab results reviewed.  Imaging  Last 90 days:  Ct Chest W Contrast  Result Date: 08/05/2018 CLINICAL DATA:  B-cell lymphoma.  Restaging. EXAM: CT CHEST, ABDOMEN, AND PELVIS WITH CONTRAST TECHNIQUE: Multidetector CT imaging of the chest, abdomen and pelvis was performed following the standard protocol during bolus administration of intravenous contrast. CONTRAST:  140mL OMNIPAQUE IOHEXOL 300 MG/ML  SOLN COMPARISON:  08/14/2017 FINDINGS: CT  CHEST FINDINGS Cardiovascular: The heart size is normal. No substantial pericardial effusion. Atherosclerotic calcification is noted in the wall of the thoracic aorta. Mediastinum/Nodes: No mediastinal lymphadenopathy.  There is no hilar lymphadenopathy. The esophagus has normal imaging features. There is no axillary lymphadenopathy. Lungs/Pleura: Atelectasis and/or scarring in the right middle lobe is similar to prior. 1.1 cm ground-glass nodule central left lower lobe (85/4) is similar. No other suspicious pulmonary nodule or mass. No pleural effusion. Musculoskeletal: No worrisome lytic or sclerotic osseous abnormality. CT ABDOMEN PELVIS FINDINGS Hepatobiliary: No suspicious focal abnormality within the liver parenchyma. There is no evidence for gallstones, gallbladder wall thickening, or pericholecystic fluid. No intrahepatic or extrahepatic biliary dilation. Pancreas: No focal mass lesion. No dilatation of the main duct. No intraparenchymal cyst. No peripancreatic edema. Spleen: No splenomegaly. No focal mass lesion. Adrenals/Urinary Tract: No adrenal nodule or mass. Kidneys unremarkable. No evidence for hydroureter. The urinary bladder appears normal for the degree of distention. Stomach/Bowel: Stomach is unremarkable. No gastric wall thickening. No evidence of outlet obstruction. Duodenum is normally positioned as is the ligament of Treitz. No small bowel wall thickening. No small bowel dilatation. The terminal ileum is normal. The appendix is not visualized, but there is no edema or inflammation in the region of the cecum. No gross colonic mass. No colonic wall thickening. Vascular/Lymphatic: No abdominal aortic aneurysm. No abdominal lymphadenopathy. No pelvic sidewall lymphadenopathy. Small lymph nodes along the right pelvic sidewall and in the right groin region are minimally larger in the interval but do not meet CT size criteria for lymphadenopathy. Reproductive: The uterus is surgically absent. There is no adnexal mass. Other: No intraperitoneal free fluid. Musculoskeletal: No worrisome lytic or sclerotic osseous abnormality. IMPRESSION: 1. No evidence for lymphadenopathy to suggest recurrent disease. 2. No change 11  mm ground-glass nodule left lower lobe. Continued attention on follow-up recommended. In the absence of lymphoma surveillance, repeat CT in 2 years recommended. This recommendation follows the consensus statement: Guidelines for Management of Incidental Pulmonary Nodules Detected on CT Images: From the Fleischner Society 2017; Radiology 2017; [id number]. 3.  Aortic Atherosclerois (ICD10-170.0) Electronically Signed   By: Misty Stanley M.D.   On: 08/05/2018 10:13   Ct Abdomen Pelvis W Contrast  Result Date: 08/05/2018 CLINICAL DATA:  B-cell lymphoma.  Restaging. EXAM: CT CHEST, ABDOMEN, AND PELVIS WITH CONTRAST TECHNIQUE: Multidetector CT imaging of the chest, abdomen and pelvis was performed following the standard protocol during bolus administration of intravenous contrast. CONTRAST:  158mL OMNIPAQUE IOHEXOL 300 MG/ML  SOLN COMPARISON:  08/14/2017 FINDINGS: CT CHEST FINDINGS Cardiovascular: The heart size is normal. No substantial pericardial effusion. Atherosclerotic calcification is noted in the wall of the thoracic aorta. Mediastinum/Nodes: No mediastinal lymphadenopathy. There is no hilar lymphadenopathy. The esophagus has normal imaging features. There is no axillary lymphadenopathy. Lungs/Pleura: Atelectasis and/or scarring in the right middle lobe is similar to prior. 1.1 cm ground-glass nodule central left lower lobe (85/4) is similar. No other suspicious pulmonary nodule or mass. No pleural effusion. Musculoskeletal: No worrisome lytic or sclerotic osseous abnormality. CT ABDOMEN PELVIS FINDINGS Hepatobiliary: No suspicious focal abnormality within the liver parenchyma. There is no evidence for gallstones, gallbladder wall thickening, or pericholecystic fluid. No intrahepatic or extrahepatic biliary dilation. Pancreas: No focal mass lesion. No dilatation of the main duct. No intraparenchymal cyst. No peripancreatic edema. Spleen: No splenomegaly. No focal mass lesion. Adrenals/Urinary Tract: No  adrenal nodule or mass. Kidneys unremarkable. No evidence for hydroureter. The urinary bladder appears normal for the degree of distention.  Stomach/Bowel: Stomach is unremarkable. No gastric wall thickening. No evidence of outlet obstruction. Duodenum is normally positioned as is the ligament of Treitz. No small bowel wall thickening. No small bowel dilatation. The terminal ileum is normal. The appendix is not visualized, but there is no edema or inflammation in the region of the cecum. No gross colonic mass. No colonic wall thickening. Vascular/Lymphatic: No abdominal aortic aneurysm. No abdominal lymphadenopathy. No pelvic sidewall lymphadenopathy. Small lymph nodes along the right pelvic sidewall and in the right groin region are minimally larger in the interval but do not meet CT size criteria for lymphadenopathy. Reproductive: The uterus is surgically absent. There is no adnexal mass. Other: No intraperitoneal free fluid. Musculoskeletal: No worrisome lytic or sclerotic osseous abnormality. IMPRESSION: 1. No evidence for lymphadenopathy to suggest recurrent disease. 2. No change 11 mm ground-glass nodule left lower lobe. Continued attention on follow-up recommended. In the absence of lymphoma surveillance, repeat CT in 2 years recommended. This recommendation follows the consensus statement: Guidelines for Management of Incidental Pulmonary Nodules Detected on CT Images: From the Fleischner Society 2017; Radiology 2017; [id number]. 3.  Aortic Atherosclerois (ICD10-170.0) Electronically Signed   By: Misty Stanley M.D.   On: 08/05/2018 10:13   [facility] C-arm 1-60 Min No Report  Result Date: 08/27/2018 Fluoro was used, but no Radiologist interpretation will be provided. Please refer to "NOTES" tab for provider progress note.   Assessment  The primary encounter diagnosis was Chronic pain syndrome. Diagnoses of Chronic low back pain (Primary Area of Pain) (Bilateral) w/ sciatica (Left), Chronic lower  extremity pain (Secondary Area of Pain) (Left), Chronic upper back pain (Tertiary Area of Pain) (Bilateral) (L>R), Chronic neck pain (Fourth Area of Pain) (Bilateral) (L>R), and Lumbar facet syndrome (Bilateral) (L>R) were also pertinent to this visit.  Plan of Care  I am having Rachael West maintain her sertraline, paliperidone, prazosin, hydrOXYzine, omeprazole, traZODone, busPIRone, linaclotide, meloxicam, cyclobenzaprine, polyethylene glycol, VESIcare, ipratropium-albuterol, lisinopril, atenolol, latanoprost, ProAir HFA, montelukast, dimenhyDRINATE, traMADol, GNP Calcium 1200, Magnesium, lubiprostone, and Benefiber.  Pharmacotherapy (Medications Ordered): No orders of the defined types were placed in this encounter.  Orders:  No orders of the defined types were placed in this encounter.  Follow-up plan:   No follow-ups on file.      Interventional management options:  Considering:   NOTE: LATEX ALLERGY & Hx Hepatitis C (w/ cirrhosis) Diagnostic left LESI Possible bilateral lumbar facetRFA Diagnostic bilateral thoracic facet block Possiblebilateral thoracic facetRFA Possible bilateral cervical facetRFA Diagnostic bilateral GONB Diagnostic trigger point injections   Palliative PRN treatment(s):   Palliative/diagnosticbilateral lumbar facet block#3 Palliative/diagnostic bilateralcervicalfacet block#2      Recent Visits Date Type Provider Dept  08/27/18 Procedure visit Milinda Pointer, MD [facility]  07/31/18 Office Visit Milinda Pointer, MD [facility]  Showing recent visits within past 90 days and meeting all other requirements   Today's Visits Date Type Provider Dept  09/11/18 Office Visit Milinda Pointer, MD [facility]  Showing today's visits and meeting all other requirements   Future Appointments No visits were found meeting these conditions.  Showing future appointments within next 90 days and meeting all  other requirements   I discussed the assessment and treatment plan with the patient. The patient was provided an opportunity to ask questions and all were answered. The patient agreed with the plan and demonstrated an understanding of the instructions.  Patient advised to call back or seek an in-person evaluation if the symptoms or condition  worsens.  Total duration of non-face-to-face encounter: *** minutes.  Note by: Gaspar Cola, MD Date: 09/11/2018; Time: 5:32 AM  Note: This dictation was prepared with Dragon dictation. Any transcriptional errors that may result from this process are unintentional.  Disclaimer:  * Given the special circumstances of the COVID-19 pandemic, the federal government has announced that the Office for Civil Rights (OCR) will exercise its enforcement discretion and will not impose penalties on physicians using telehealth in the event of noncompliance with regulatory requirements under the [place] (HIPAA) in connection with the good faith provision of telehealth during the XX123456 national public health emergency. ([place])

## 2018-09-12 ENCOUNTER — Other Ambulatory Visit: Payer: Self-pay | Admitting: Adult Health

## 2018-09-12 NOTE — Progress Notes (Addendum)
Attempted Virtual Visit via Telephone   I attempted to contact Rachael West on 09/11/2018 via telephone. I was unable to complete the visit due to no one answering the phone, or unable to get a proper contact number. I was also unable to leave a message  I clearly identified myself as Gaspar Cola, MD.  Post-Procedure Evaluation  Procedure: Diagnostic bilateral lumbar facet block #2 under fluoroscopic guidance and IV sedation Pre-procedure pain level:  4/10 Post-procedure: 0/10          Sedation: Sedation provided.  Effectiveness during initial hour after procedure(Ultra-Short Term Relief): 100 %   Local anesthetic used: Long-acting (4-6 hours) Effectiveness: Defined as any analgesic benefit obtained secondary to the administration of local anesthetics. This carries significant diagnostic value as to the etiological location, or anatomical origin, of the pain. Duration of benefit is expected to coincide with the duration of the local anesthetic used.  Effectiveness during initial 4-6 hours after procedure(Short-Term Relief): 100 %   Long-term benefit: Defined as any relief past the pharmacologic duration of the local anesthetics.  Effectiveness past the initial 6 hours after procedure(Long-Term Relief): 100 %   Note: Information collected during prescreening visit by nursing staff.  Pharmacotherapy Assessment  Analgesic: Tramadol50 mg,2tabs (100 mg) PO q 6 hrs (400 mg/day of tramadol) MME/day:40mg /day.    Follow-up plan:   No follow-ups on file.      Interventional management options:  Considering:   NOTE: LATEX ALLERGY & Hx Hepatitis C (w/ cirrhosis) Diagnostic left LESI Possible bilateral lumbar facetRFA Diagnostic bilateral thoracic facet block Possiblebilateral thoracic facetRFA Possible bilateral cervical facetRFA Diagnostic bilateral GONB Diagnostic trigger point injections   Palliative PRN treatment(s):   Palliative/diagnosticbilateral lumbar  facet block#3 Palliative/diagnostic bilateralcervicalfacet block#2    Recent Visits Date Type Provider Dept  09/11/18 Office Visit Milinda Pointer, MD [facility]  08/27/18 Procedure visit Milinda Pointer, MD [facility]  07/31/18 Office Visit Milinda Pointer, MD [facility]  Showing recent visits within past 90 days and meeting all other requirements   Future Appointments No visits were found meeting these conditions.  Showing future appointments within next 90 days and meeting all other requirements   Note by: Gaspar Cola, MD Date: 09/11/2018

## 2018-09-16 ENCOUNTER — Other Ambulatory Visit: Payer: Self-pay | Admitting: Adult Health

## 2018-09-17 ENCOUNTER — Other Ambulatory Visit: Payer: Self-pay

## 2018-09-17 MED ORDER — VESICARE 10 MG PO TABS: 10.0000 mg | ORAL_TABLET | Freq: Every day | ORAL | 1 refills | Status: DC

## 2018-09-17 NOTE — Telephone Encounter (Signed)
We have not prescribed these medications for the patient previously.  Please review and refill if appropriate.  T. Deanta Mincey, CMA  

## 2018-09-17 NOTE — Telephone Encounter (Signed)
She should f/u with Gertie Fey for intermittent nausea- will refill FINAL time of Ondansetron Thanks! Valetta Fuller

## 2018-09-17 NOTE — Telephone Encounter (Signed)
I'm unsure about refilling the ondansetron.  Please review for appropriateness of refill.  Charyl Bigger, CMA

## 2018-09-17 NOTE — Telephone Encounter (Signed)
MyChart message sent to pt with information regarding refills on Ondansetron.  Charyl Bigger, CMA

## 2018-09-25 ENCOUNTER — Other Ambulatory Visit: Payer: Self-pay | Admitting: Adult Health

## 2018-09-25 MED ORDER — MIRABEGRON ER 25 MG PO TB24: 25.0000 mg | ORAL_TABLET | Freq: Every day | ORAL | 0 refills | Status: DC

## 2018-09-27 ENCOUNTER — Encounter: Payer: Self-pay | Admitting: Pain Medicine

## 2018-09-29 NOTE — Progress Notes (Signed)
Pain Management Virtual Encounter Note - Virtual Visit via Telephone Telehealth (real-time audio visits between healthcare provider and patient).   Patient's Phone No. & Preferred Pharmacy:  [phone] (home); There is no such number on file (mobile).; (Preferred) [phone] [email or web address]  [id number] [address], [place] [address] [place] [address] [phone] [phone]    Pre-screening note:  Our staff contacted Rachael West and offered her an "in person", "face-to-face" appointment versus a telephone encounter. She indicated preferring the telephone encounter, at this time.   Reason for Virtual Visit: COVID-19*  Social distancing based on CDC and AMA recommendations.   I contacted Rachael West on 09/30/2018 via telephone.      I clearly identified myself as Gaspar Cola, MD. I verified that I was speaking with the correct person using two identifiers (Name: Rachael West, and date of birth: [birth year]).  Advanced Informed Consent I sought verbal advanced consent from Rachael West for virtual visit interactions. I informed Rachael West of possible security and privacy concerns, risks, and limitations associated with providing "not-in-person" medical evaluation and management services. I also informed Rachael West of the availability of "in-person" appointments. Finally, I informed her that there would be a charge for the virtual visit and that she could be  personally, fully or partially, financially responsible for it. Rachael West expressed understanding and agreed to proceed.   Historic Elements   Rachael West is a 63 y.o. year old, female patient evaluated today after her last encounter by our practice on 08/28/2018. Rachael West  has a past medical history of Anemia, Anxiety, Arthritis, Asthma, Depression, GERD (gastroesophageal reflux disease),  Hepatitis-C, History of chemotherapy, Hypertension, Insomnia, Lymphoma ([place]) (12/2013), Personal history of non-Hodgkin lymphomas, and Vitamin D deficiency. She also  has a past surgical history that includes Appendectomy; Trigger finger release; Esophagogastroduodenoscopy (01/17/2011); Colonoscopy (01/17/2011); Abdominal hysterectomy; and Breast biopsy (Left, 2015). Rachael West has a current medication list which includes the following prescription(s): atenolol, buspirone, gnp calcium 1200, cyclobenzaprine, dimenhydrinate, hydroxyzine, ipratropium-albuterol, latanoprost, linaclotide, lisinopril, lubiprostone, magnesium, meloxicam, mirabegron er, montelukast, omeprazole, ondansetron, paliperidone, polyethylene glycol, prazosin, proair hfa, sertraline, tramadol, trazodone, and benefiber. She  reports that she quit smoking about 28 years ago. Her smoking use included cigarettes. She has a 30.00 pack-year smoking history. She has never used smokeless tobacco. She reports that she does not drink alcohol or use drugs. Rachael West is allergic to latex; penicillins; codeine; cortisone; and sulfa antibiotics.   HPI  Today, she is being contacted for a post-procedure assessment.  The patient indicates having had physical therapy right before we started to him the shots at the beginning of the year.  It did not seem to help that much.  At this point the injections have been providing her with several months of pain relief and for the time being, we will continue with those.  Post-Procedure Evaluation  Procedure: Diagnostic bilateral lumbar facet block #2 under fluoroscopic guidance and IV sedation Pre-procedure pain level:  4/10 Post-procedure: 0/10          Sedation: Sedation provided.  Effectiveness during initial hour after procedure(Ultra-Short Term Relief): 100 %   Local anesthetic used: Long-acting (4-6 hours) Effectiveness: Defined as any analgesic benefit obtained secondary to the administration  of local anesthetics. This carries significant diagnostic value as to the etiological location, or anatomical origin, of the pain. Duration of benefit is expected to coincide with the duration of the local anesthetic used.  Effectiveness during initial 4-6 hours after procedure(Short-Term Relief): 100 %   Long-term benefit: Defined as any relief past the pharmacologic duration of the local anesthetics.  Effectiveness past the initial 6 hours after procedure(Long-Term Relief): 100 %   Plan: PRN facet joint injections.   Pharmacotherapy Assessment  Analgesic: Tramadol50 mg,2tabs (100 mg) PO q 6 hrs (400 mg/day of tramadol) (has enough medication to last until 02/04/2019) MME/day:40mg /day.   Monitoring: Pharmacotherapy: No side-effects or adverse reactions reported. [place] PMP: PDMP reviewed during this encounter.       Compliance: No problems identified. Effectiveness: Clinically acceptable. Plan: Refer to "POC".  UDS:  Summary  Date Value Ref Range Status  02/06/2018 FINAL  Final    Comment:    ==================================================================== TOXASSURE SELECT 13 (MW) ==================================================================== Test                             Result       Flag       Units Drug Present and Declared for Prescription Verification   Tramadol                       >3759        EXPECTED   ng/mg creat   O-Desmethyltramadol            >3759        EXPECTED   ng/mg creat   N-Desmethyltramadol            >3759        EXPECTED   ng/mg creat    Source of tramadol is a prescription medication.    O-desmethyltramadol and N-desmethyltramadol are expected    metabolites of tramadol. ==================================================================== Test                      Result    Flag   Units      Ref Range   Creatinine              133              mg/dL      >=20 ==================================================================== Declared  Medications:  The flagging and interpretation on this report are based on the  following declared medications.  Unexpected results may arise from  inaccuracies in the declared medications.  **Note: The testing scope of this panel includes these medications:  Tramadol (Ultram)  **Note: The testing scope of this panel does not include following  reported medications:  Albuterol (Duoneb)  Albuterol (Proventil)  Atenolol  Calcium carbonate (Calcium Carb/Vitamin D)  Cyclobenzaprine (Flexeril)  Hydroxyzine (Vistaril)  Ipratropium (Duoneb)  Latanoprost (Xalatan)  Linaclotide (Linzess)  Lisinopril  Magnesium  Meloxicam (Mobic)  Montelukast (Singulair)  Omeprazole (Prilosec)  Ondansetron (Zofran)  Paliperidone (Invega)  Polyethylene Glycol (MiraLAX)  Prazosin (Minipress)  Sertraline (Zoloft)  Solifenacin (Vesicare)  Trazodone (Desyrel)  Vitamin D (Calcium Carb/Vitamin D) ==================================================================== For clinical consultation, please call [phone]. ====================================================================    Laboratory Chemistry Profile (12 mo)  Renal: 05/29/2018: BUN/Creatinine Ratio 9 08/05/2018: BUN 14; Creatinine 0.98  Lab Results  Component Value Date   GFRAA >60 08/05/2018   GFRNONAA >60 08/05/2018   Hepatic: 08/05/2018: Albumin 3.5 Lab Results  Component Value Date   AST 21 08/05/2018   ALT 15 08/05/2018   Other: No results found for requested labs within last 8760 hours. Note: Above Lab results reviewed.  Imaging  Last 90 days:  Ct Chest  W Contrast  Result Date: 08/05/2018 CLINICAL DATA:  B-cell lymphoma.  Restaging. EXAM: CT CHEST, ABDOMEN, AND PELVIS WITH CONTRAST TECHNIQUE: Multidetector CT imaging of the chest, abdomen and pelvis was performed following the standard protocol during bolus administration of intravenous contrast. CONTRAST:  166mL OMNIPAQUE IOHEXOL 300 MG/ML  SOLN COMPARISON:  08/14/2017  FINDINGS: CT CHEST FINDINGS Cardiovascular: The heart size is normal. No substantial pericardial effusion. Atherosclerotic calcification is noted in the wall of the thoracic aorta. Mediastinum/Nodes: No mediastinal lymphadenopathy. There is no hilar lymphadenopathy. The esophagus has normal imaging features. There is no axillary lymphadenopathy. Lungs/Pleura: Atelectasis and/or scarring in the right middle lobe is similar to prior. 1.1 cm ground-glass nodule central left lower lobe (85/4) is similar. No other suspicious pulmonary nodule or mass. No pleural effusion. Musculoskeletal: No worrisome lytic or sclerotic osseous abnormality. CT ABDOMEN PELVIS FINDINGS Hepatobiliary: No suspicious focal abnormality within the liver parenchyma. There is no evidence for gallstones, gallbladder wall thickening, or pericholecystic fluid. No intrahepatic or extrahepatic biliary dilation. Pancreas: No focal mass lesion. No dilatation of the main duct. No intraparenchymal cyst. No peripancreatic edema. Spleen: No splenomegaly. No focal mass lesion. Adrenals/Urinary Tract: No adrenal nodule or mass. Kidneys unremarkable. No evidence for hydroureter. The urinary bladder appears normal for the degree of distention. Stomach/Bowel: Stomach is unremarkable. No gastric wall thickening. No evidence of outlet obstruction. Duodenum is normally positioned as is the ligament of Treitz. No small bowel wall thickening. No small bowel dilatation. The terminal ileum is normal. The appendix is not visualized, but there is no edema or inflammation in the region of the cecum. No gross colonic mass. No colonic wall thickening. Vascular/Lymphatic: No abdominal aortic aneurysm. No abdominal lymphadenopathy. No pelvic sidewall lymphadenopathy. Small lymph nodes along the right pelvic sidewall and in the right groin region are minimally larger in the interval but do not meet CT size criteria for lymphadenopathy. Reproductive: The uterus is surgically  absent. There is no adnexal mass. Other: No intraperitoneal free fluid. Musculoskeletal: No worrisome lytic or sclerotic osseous abnormality. IMPRESSION: 1. No evidence for lymphadenopathy to suggest recurrent disease. 2. No change 11 mm ground-glass nodule left lower lobe. Continued attention on follow-up recommended. In the absence of lymphoma surveillance, repeat CT in 2 years recommended. This recommendation follows the consensus statement: Guidelines for Management of Incidental Pulmonary Nodules Detected on CT Images: From the Fleischner Society 2017; Radiology 2017; [id number]. 3.  Aortic Atherosclerois (ICD10-170.0) Electronically Signed   By: Misty Stanley M.D.   On: 08/05/2018 10:13   Ct Abdomen Pelvis W Contrast  Result Date: 08/05/2018 CLINICAL DATA:  B-cell lymphoma.  Restaging. EXAM: CT CHEST, ABDOMEN, AND PELVIS WITH CONTRAST TECHNIQUE: Multidetector CT imaging of the chest, abdomen and pelvis was performed following the standard protocol during bolus administration of intravenous contrast. CONTRAST:  19mL OMNIPAQUE IOHEXOL 300 MG/ML  SOLN COMPARISON:  08/14/2017 FINDINGS: CT CHEST FINDINGS Cardiovascular: The heart size is normal. No substantial pericardial effusion. Atherosclerotic calcification is noted in the wall of the thoracic aorta. Mediastinum/Nodes: No mediastinal lymphadenopathy. There is no hilar lymphadenopathy. The esophagus has normal imaging features. There is no axillary lymphadenopathy. Lungs/Pleura: Atelectasis and/or scarring in the right middle lobe is similar to prior. 1.1 cm ground-glass nodule central left lower lobe (85/4) is similar. No other suspicious pulmonary nodule or mass. No pleural effusion. Musculoskeletal: No worrisome lytic or sclerotic osseous abnormality. CT ABDOMEN PELVIS FINDINGS Hepatobiliary: No suspicious focal abnormality within the liver parenchyma. There is no evidence for gallstones, gallbladder wall thickening,  or pericholecystic fluid. No  intrahepatic or extrahepatic biliary dilation. Pancreas: No focal mass lesion. No dilatation of the main duct. No intraparenchymal cyst. No peripancreatic edema. Spleen: No splenomegaly. No focal mass lesion. Adrenals/Urinary Tract: No adrenal nodule or mass. Kidneys unremarkable. No evidence for hydroureter. The urinary bladder appears normal for the degree of distention. Stomach/Bowel: Stomach is unremarkable. No gastric wall thickening. No evidence of outlet obstruction. Duodenum is normally positioned as is the ligament of Treitz. No small bowel wall thickening. No small bowel dilatation. The terminal ileum is normal. The appendix is not visualized, but there is no edema or inflammation in the region of the cecum. No gross colonic mass. No colonic wall thickening. Vascular/Lymphatic: No abdominal aortic aneurysm. No abdominal lymphadenopathy. No pelvic sidewall lymphadenopathy. Small lymph nodes along the right pelvic sidewall and in the right groin region are minimally larger in the interval but do not meet CT size criteria for lymphadenopathy. Reproductive: The uterus is surgically absent. There is no adnexal mass. Other: No intraperitoneal free fluid. Musculoskeletal: No worrisome lytic or sclerotic osseous abnormality. IMPRESSION: 1. No evidence for lymphadenopathy to suggest recurrent disease. 2. No change 11 mm ground-glass nodule left lower lobe. Continued attention on follow-up recommended. In the absence of lymphoma surveillance, repeat CT in 2 years recommended. This recommendation follows the consensus statement: Guidelines for Management of Incidental Pulmonary Nodules Detected on CT Images: From the Fleischner Society 2017; Radiology 2017; [id number]. 3.  Aortic Atherosclerois (ICD10-170.0) Electronically Signed   By: Misty Stanley M.D.   On: 08/05/2018 10:13   [facility] C-arm 1-60 Min No Report  Result Date: 08/27/2018 Fluoro was used, but no Radiologist interpretation will be provided.  Please refer to "NOTES" tab for provider progress note.   Assessment  The primary encounter diagnosis was Chronic pain syndrome. Diagnoses of Chronic low back pain (Primary Area of Pain) (Bilateral) (L>R) w/o sciatica, Lumbar facet syndrome (Bilateral) (L>R), Chronic lower extremity pain (Secondary Area of Pain) (Left), Chronic upper back pain (Tertiary Area of Pain) (Bilateral) (L>R), Chronic neck pain (Fourth Area of Pain) (Bilateral) (L>R), and Cervical facet syndrome (Bilateral) (R>L) were also pertinent to this visit.  Plan of Care  I am having Rachael West maintain her sertraline, paliperidone, prazosin, hydrOXYzine, omeprazole, traZODone, busPIRone, linaclotide, meloxicam, cyclobenzaprine, polyethylene glycol, lisinopril, atenolol, latanoprost, ProAir HFA, montelukast, dimenhyDRINATE, traMADol, GNP Calcium 1200, Magnesium, lubiprostone, Benefiber, ipratropium-albuterol, ondansetron, and mirabegron ER.  Pharmacotherapy (Medications Ordered): No orders of the defined types were placed in this encounter.  Orders:  Orders Placed This Encounter  Procedures  . CERVICAL FACET (MEDIAL BRANCH NERVE BLOCK)     CLINICAL INDICATIONS: Neck pain. Indications: Cervical Facet Syndrome.    Standing Status:   Standing    Number of Occurrences:   2    Standing Expiration Date:   03/29/2020    Scheduling Instructions:     LATERALITY: TBD     Level: C3-4, C4-5, C5-6 Facet joints (C3, C4, C5, C6, & C7 Medial Branch Nerves)     SEDATION: Patient's choice.     TIMEFRAME: PRN procedure. (Rachael West will call when needed.)    Order Specific Question:   Where will this procedure be performed?    Answer:   [facility]  . Radiofrequency,Lumbar    Scheduling timeframe: (PRN procedure) Rachael West will call when needed. Clinical indication: Axial low back pain. 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  Sedation: Usually done with sedation. (May be done without sedation if so desired by  patient.)  Requirements: NPO x 8 hrs.; Driver; Stop blood thinners.    Standing Status:   Standing    Number of Occurrences:   1    Standing Expiration Date:   03/29/2020    Scheduling Instructions:     Procedure: Lumbar Facet Joint Medial Branch Radiofrequency Ablation (RFA)     Level: L3-4, L4-5, & L5-S1 Facets (L2, L3, L4, L5, & S1 Medial Branch Nerves)     Laterality: Left-sided    Order Specific Question:   Where will this procedure be performed?    Answer:   [facility]   Follow-up plan:   Return in about 17 weeks (around 01/27/2019) for (VV), (MM) + PRN Procedure(s).      Interventional management options:  Considering:   NOTE: LATEX ALLERGY & Hx Hepatitis C (w/ cirrhosis) Diagnostic left LESI Possible bilateral lumbar facetRFA Diagnostic bilateral thoracic facet block Possiblebilateral thoracic facetRFA Possible bilateral cervical facetRFA Diagnostic bilateral GONB Diagnostic trigger point injections   Palliative PRN treatment(s):   Palliative/diagnosticbilateral lumbar facet block#3 Palliative/diagnostic bilateralcervicalfacet block#2    Recent Visits Date Type Provider Dept  08/27/18 Procedure visit Milinda Pointer, MD [facility]  07/31/18 Office Visit Milinda Pointer, MD [facility]  Showing recent visits within past 90 days and meeting all other requirements   Today's Visits Date Type Provider Dept  09/30/18 Office Visit Milinda Pointer, MD [facility]  Showing today's visits and meeting all other requirements   Future Appointments No visits were found meeting these conditions.  Showing future appointments within next 90 days and meeting all other requirements   I discussed the assessment and treatment plan with the patient. The patient was provided an opportunity to ask questions and all were answered. The patient agreed with the plan and demonstrated an understanding of the instructions.  Patient  advised to call back or seek an in-person evaluation if the symptoms or condition worsens.  Total duration of non-face-to-face encounter: 12 minutes.  Note by: Gaspar Cola, MD Date: 09/30/2018; Time: 10:03 AM  Note: This dictation was prepared with Dragon dictation. Any transcriptional errors that may result from this process are unintentional.  Disclaimer:  * Given the special circumstances of the COVID-19 pandemic, the federal government has announced that the Office for Civil Rights (OCR) will exercise its enforcement discretion and will not impose penalties on physicians using telehealth in the event of noncompliance with regulatory requirements under the [place] (HIPAA) in connection with the good faith provision of telehealth during the XX123456 national public health emergency. ([place])

## 2018-09-30 ENCOUNTER — Other Ambulatory Visit: Payer: Self-pay

## 2018-09-30 ENCOUNTER — Ambulatory Visit: Payer: Medicare Other | Attending: Pain Medicine | Admitting: Pain Medicine

## 2018-09-30 DIAGNOSIS — G894 Chronic pain syndrome: Secondary | ICD-10-CM

## 2018-09-30 DIAGNOSIS — G8929 Other chronic pain: Secondary | ICD-10-CM | POA: Insufficient documentation

## 2018-09-30 DIAGNOSIS — M79605 Pain in left leg: Secondary | ICD-10-CM | POA: Diagnosis not present

## 2018-09-30 DIAGNOSIS — M545 Low back pain, unspecified: Secondary | ICD-10-CM

## 2018-09-30 DIAGNOSIS — M542 Cervicalgia: Secondary | ICD-10-CM

## 2018-09-30 DIAGNOSIS — M47816 Spondylosis without myelopathy or radiculopathy, lumbar region: Secondary | ICD-10-CM | POA: Diagnosis not present

## 2018-09-30 DIAGNOSIS — M549 Dorsalgia, unspecified: Secondary | ICD-10-CM

## 2018-09-30 DIAGNOSIS — M47812 Spondylosis without myelopathy or radiculopathy, cervical region: Secondary | ICD-10-CM

## 2018-09-30 NOTE — Patient Instructions (Signed)

## 2018-10-17 ENCOUNTER — Other Ambulatory Visit: Payer: Self-pay | Admitting: Adult Health

## 2018-10-22 ENCOUNTER — Other Ambulatory Visit: Payer: Self-pay | Admitting: Adult Health

## 2018-10-22 ENCOUNTER — Telehealth: Payer: Self-pay

## 2018-10-22 NOTE — Telephone Encounter (Signed)
Please call pt and advise her that she must have office visit prior to any further refills.  Charyl Bigger, CMA

## 2018-11-14 ENCOUNTER — Other Ambulatory Visit: Payer: Self-pay | Admitting: *Deleted

## 2018-11-14 ENCOUNTER — Telehealth: Payer: Self-pay | Admitting: Gastroenterology

## 2018-11-14 MED ORDER — ONDANSETRON HCL 4 MG PO TABS: 4.0000 mg | ORAL_TABLET | Freq: Three times a day (TID) | ORAL | 0 refills | Status: DC | PRN

## 2018-11-14 NOTE — Telephone Encounter (Signed)
Hard to say what's going on - could have stomach virus.  Please send in script for zofran 4 mg, one tablet every 8 hours as needed, Disp #21, RF 0  Continue 20 mg omeprazole until office follow up.

## 2018-11-14 NOTE — Telephone Encounter (Signed)
Pt cannot keep food down, she would like some advise.

## 2018-11-14 NOTE — Telephone Encounter (Signed)
Spoke to the patient who was last seen in the office on 06/01/17. Patient states overnight she vomited "several times" and reports her "throat burns." She states the burning started prior to the vomiting. She reports she eat dinner and about an hour later, vomited "orange liquid." She is also c/o nausea since 10/29. No sick contacts, states she is afebrile but feels "clammy." She took 1 dose of pepto bismol and one 20 mg OTC omeprazole although she states she was on a 40 mg daily dose but has run out of the medication. The patient has been scheduled with AE on 11/10 at 3:50 pm. This was the soonest she could be scheduled with an APP. Please advise.

## 2018-11-14 NOTE — Telephone Encounter (Signed)
Prescription sent to the pharmacy on file. Spoke to patient to tell the patient Dr. Loletha Carrow' recommendations. Patient appreciative of the Zofran script.

## 2018-11-15 ENCOUNTER — Other Ambulatory Visit: Payer: Self-pay | Admitting: Adult Health

## 2018-11-15 DIAGNOSIS — Z1231 Encounter for screening mammogram for malignant neoplasm of breast: Secondary | ICD-10-CM

## 2018-11-19 ENCOUNTER — Ambulatory Visit: Payer: Medicare Other | Admitting: Physician Assistant

## 2018-11-21 ENCOUNTER — Other Ambulatory Visit: Payer: Self-pay

## 2018-11-21 ENCOUNTER — Encounter: Payer: Self-pay | Admitting: Adult Health

## 2018-11-21 ENCOUNTER — Ambulatory Visit (INDEPENDENT_AMBULATORY_CARE_PROVIDER_SITE_OTHER): Payer: Medicare Other | Admitting: Adult Health

## 2018-11-21 DIAGNOSIS — I10 Essential (primary) hypertension: Secondary | ICD-10-CM

## 2018-11-21 DIAGNOSIS — Z0000 Encounter for general adult medical examination without abnormal findings: Secondary | ICD-10-CM

## 2018-11-21 DIAGNOSIS — F329 Major depressive disorder, single episode, unspecified: Secondary | ICD-10-CM | POA: Diagnosis not present

## 2018-11-21 DIAGNOSIS — F32A Depression, unspecified: Secondary | ICD-10-CM

## 2018-11-21 MED ORDER — MIRABEGRON ER 25 MG PO TB24: 25.0000 mg | ORAL_TABLET | Freq: Every day | ORAL | 0 refills | Status: DC

## 2018-11-21 NOTE — Assessment & Plan Note (Signed)
Assessment and Plan: Continue all medications as directed. Increase water and fiber intake. Increase daily walking. Decrease saturated fat intake by 50%- re-check lipids at f/u in 2-3 months. Continue f/u with various specialists  Follow Up Instructions: 2-3 months OV with lipid panel   I discussed the assessment and treatment plan with the patient. The patient was provided an opportunity to ask questions and all were answered. The patient agreed with the plan and demonstrated an understanding of the instructions.   The patient was advised to call back or seek an in-person evaluation if the symptoms worsen or if the condition fails to improve as anticipated.

## 2018-11-21 NOTE — Progress Notes (Signed)
Virtual Visit via Telephone Note  I connected with Rachael West on 11/21/18 at  1:45 PM EST by telephone and verified that I am speaking with the correct person using two identifiers.  Location: Patient:Home Provider: In Clinic  I discussed the limitations, risks, security and privacy concerns of performing an evaluation and management service by telephone and the availability of in person appointments. I also discussed with the patient that there may be a patient responsible charge related to this service. The patient expressed understanding and agreed to proceed.   History of Present Illness: 07/31/2018 OV: Rachael West calls in for regular f/u: HTN, Chronic constipation, Anxiety/Depression She reports ambulatory readings- SBP 110-130 DBP 70-90 HR 80-90s She denies acute cardiac sx's She denies ACE cough She is followed by [facility] every Wed- via [facility] therapy every 2 weeks - via [facility] Q6weeks - in office She reports stable mood, denies SI/HI She reports perpetual nausea since her lymphoma treatment in 2016- has been using Zofran regularly to treat She is followed by [facility] Q6Weeks She is on Tramadol 50mg - she reports 6 tablets/day Discussed that chronic opiate use can cause chronic constipation and may even be contributing her nausea  Encourage her to discuss these sx's with her Pain Mgt specialist at her next f/u. She drinks 5 x 16ox bottles of water per day She reports cooking most of her meals with margarine The 10-year ASCVD risk score Mikey Bussing DC Jr., et al., 2013) is: 6.5%   Values used to calculate the score:     Age: 63 years     Sex: Female     Is Non-Hispanic African American: Yes     Diabetic: No     Tobacco smoker: No     Systolic Blood Pressure: 99991111 mmHg     Is BP treated: Yes     HDL Cholesterol: 37 mg/dL     Total Cholesterol: 187 mg/dL  LDL-125 Advised her to reduce saturated fat intake, will recheck  lipids at f/u She remains active with daily walking and home calisthenics program  11/21/2018 OV: Rachael West calls in today for 3 month f/u; HTN, Chronic constipation, Anxiety/Depression She reports ambulatory readings- SBP 110-120 DBP 60-80 HR 80-90s She denies acute cardiac sx's  She continues to experience N/V, chronic constipation with infrequent diarrhea 11/14/2018 - GI/Dr. Loletha Carrow sent in Zofran, advised to continue Continue 20 mg omeprazole She has f/u with GI in next week  She is followed by [facility] every Wed- via [facility] therapy every 2 weeks - via [facility] Q6weeks - in office She reports stable mood, denies SI/HI   The 10-year ASCVD risk score Mikey Bussing DC Brooke Bonito., et al., 2013) is: 7.2%   Values used to calculate the score:     Age: 63 years     Sex: Female     Is Non-Hispanic African American: Yes     Diabetic: No     Tobacco smoker: No     Systolic Blood Pressure: 99991111 mmHg     Is BP treated: Yes     HDL Cholesterol: 37 mg/dL     Total Cholesterol: 187 mg/dL  LDL-125 Recommend re-checking lipid at f/u in 3 months She reports eating eggs/cheese daily, red meat >4 times per week- advised to reduce by 50%  She has Mammogram schedule this month  She has appt with Urologist this month to address urinary retention. She has previously had to straight cath to void- not currently  performing straight caths  Patient Care Team    Relationship Specialty Notifications Start End  Mina Marble D, NP PCP - General Family Medicine  03/21/17   Laurence Spates, MD Consulting Physician Gastroenterology  12/01/14   Doran Stabler, MD Consulting Physician Gastroenterology  05/01/17   Elsie Saas, MD Consulting Physician Orthopedic Surgery  05/01/17     Patient Active Problem List   Diagnosis Date Noted  . Chronic low back pain (Primary Area of Pain) (Bilateral) (L>R) w/o sciatica 09/30/2018  . Chronic musculoskeletal pain 07/31/2018  . Therapeutic  opioid-induced constipation (OIC) 07/31/2018  . Encounter for Medicare annual wellness exam 05/06/2018  . Gait disturbance 11/28/2017  . Cervical facet syndrome (Bilateral) (R>L) 10/24/2017  . Spondylosis without myelopathy or radiculopathy, cervical region 10/24/2017  . Latex precautions, history of latex allergy 10/09/2017  . Elevated uric acid in blood 09/27/2017  . Chronic idiopathic gout 09/27/2017  . DDD (degenerative disc disease), cervical 09/27/2017  . 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 Chronic shoulder pain (Bilateral) (R>L) 09/26/2017  . Chronic hip pain (Bilateral) (L>R) 09/26/2017  . Cervicalgia 09/26/2017  . DDD (degenerative disc disease), lumbar 09/25/2017  . Lumbar facet arthropathy 09/25/2017  . Lumbar facet syndrome (Bilateral) (L>R) 09/25/2017  . Spondylosis without myelopathy or radiculopathy, lumbar region 09/25/2017  . DDD (degenerative disc disease), thoracic 09/25/2017  . Thoracolumbar dextroscoliosis 09/25/2017  . Vitamin D deficiency 09/25/2017  . Cervicogenic headache 09/25/2017  . Other dysphagia 09/24/2017  . Tendinitis of left rotator cuff 09/24/2017  . Tendinitis of right rotator cuff 09/24/2017  . Fibromyalgia 09/24/2017  . Elevated C-reactive protein (CRP) 09/03/2017  . Elevated sed rate 09/03/2017  . Chronic low back pain (Primary Area of Pain) (Bilateral) w/ sciatica (Left) 08/29/2017  . Chronic lower extremity pain (Secondary Area of Pain) (Left) 08/29/2017  . Chronic upper back pain [facility] Area of Pain) (Bilateral) (L>R) 08/29/2017  . Chronic neck pain (Fourth Area of Pain) (Bilateral) (L>R) 08/29/2017  . Occipital headache 08/29/2017  . Chronic knee pain (Bilateral) (R>L) 08/29/2017  . Chronic pain syndrome 08/29/2017  . Pharmacologic therapy 08/29/2017  . Long term current use of opiate analgesic 08/29/2017  . Disorder of skeletal system 08/29/2017  . Problems influencing health status 08/29/2017  .  Chronic sacroiliac joint pain 08/29/2017  . Hyperlipidemia 07/31/2017  . GERD (gastroesophageal reflux disease) 05/01/2017  . Anxiety 05/01/2017  . Depression 05/01/2017  . Healthcare maintenance 05/01/2017  . Chronic idiopathic constipation 05/01/2017  . Dyspnea 10/22/2014  . Obesity (BMI 30-39.9) 10/22/2014  . Obesity 09/12/2014  . Essential hypertension 09/12/2014  . Pneumonia, lobar ([place]) 09/11/2014  . Intermittent asthma 09/11/2014  . Cirrhosis ([place]) 09/08/2014  . Chronic hepatitis C without hepatic coma ([place]) 01/15/2014  . 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 Constipation 04/24/2012  . Anemia 05/25/2011  . Atypical chest pain 05/25/2011  . History of thyroid disease 05/25/2011  . Hepatitis C 05/25/2011  . Benign hypertension 05/25/2011  . Gastritis 05/25/2011  . Asthma 05/25/2011  . Stress incontinence 02/10/2011  . Urge incontinence 02/10/2011  . Osteoarthrosis involving multiple sites 10/17/2010     Past Medical History:  Diagnosis Date  . Anemia   . Anxiety   . Arthritis   . Asthma   . Depression   . GERD (gastroesophageal reflux disease)   . Hepatitis-C   . History of chemotherapy   . Hypertension   . Insomnia   . Lymphoma ([place]) 12/2013   aggressive lymphoma  .  Personal history of non-Hodgkin lymphomas   . Vitamin D deficiency      Past Surgical History:  Procedure Laterality Date  . ABDOMINAL HYSTERECTOMY    . APPENDECTOMY    . BREAST BIOPSY Left 2015   lymphoma  . COLONOSCOPY  01/17/2011   Procedure: COLONOSCOPY;  Surgeon: Winfield Cunas., MD;  Location: [facility];  Service: Endoscopy;  Laterality: N/A;  . ESOPHAGOGASTRODUODENOSCOPY  01/17/2011   Procedure: ESOPHAGOGASTRODUODENOSCOPY (EGD);  Surgeon: Winfield Cunas., MD;  Location: [facility];  Service: Endoscopy;  Laterality: N/A;  . TRIGGER FINGER RELEASE       Family History  Problem Relation Age of Onset  . Asthma Mother   . Allergies Mother   . Asthma Sister   . Heart  disease Father   . Heart disease Brother   . Diabetes Maternal Grandmother   . Diabetes Maternal Grandfather   . Heart disease Paternal Grandmother   . Heart disease Paternal Grandfather   . Colon cancer Paternal Aunt   . Liver cancer Paternal Uncle   . Breast cancer Cousin   . Ovarian cancer Other        great aunt (m)  . Pancreatic cancer Other        great aunt (m)     Social History   Substance and Sexual Activity  Drug Use No     Social History   Substance and Sexual Activity  Alcohol Use No  . Alcohol/week: 0.0 standard drinks     Social History   Tobacco Use  Smoking Status Former Smoker  . Packs/day: 1.50  . Years: 20.00  . Pack years: 30.00  . Types: Cigarettes  . Quit date: 01/09/1990  . Years since quitting: 28.8  Smokeless Tobacco Never Used     Outpatient Encounter Medications as of 11/21/2018  Medication Sig  . atenolol (TENORMIN) 25 MG tablet Take 1 tablet (25 mg total) by mouth daily.  . busPIRone (BUSPAR) 5 MG tablet Take 1 tablet (5 mg total) by mouth 2 (two) times daily. (Patient taking differently: Take 5 mg by mouth 3 (three) times daily. )  . Calcium Carbonate-Vit D-Min (GNP CALCIUM 1200) 1200-1000 MG-UNIT CHEW Chew 1,200 mg by mouth daily with breakfast. Take in combination with vitamin D and magnesium.  . cyclobenzaprine (FLEXERIL) 5 MG tablet Take 1 tablet by mouth at bedtime.  . dimenhyDRINATE (DRAMAMINE) 50 MG tablet Take 50 mg by mouth every 8 (eight) hours as needed.  . hydrOXYzine (VISTARIL) 50 MG capsule Take 50 mg by mouth at bedtime.  Marland Kitchen ipratropium-albuterol (DUONEB) 0.5-2.5 (3) MG/3ML SOLN INHALE 3 MLS VIA NEBULIZER INTO THE LUNGS. SEE ADMIN INSTRUCTIONS  . latanoprost (XALATAN) 0.005 % ophthalmic solution Place 1 drop into both eyes daily.  Marland Kitchen linaclotide (LINZESS) 72 MCG capsule Take 1 capsule (72 mcg total) by mouth daily before breakfast.  . lisinopril (ZESTRIL) 5 MG tablet TAKE 1 TABLET BY MOUTH EVERY DAY  . lubiprostone  (AMITIZA) 24 MCG capsule Take 1 capsule (24 mcg total) by mouth 2 (two) times daily with a meal. Swallow the medication whole. Do not break or chew the medication.  . Magnesium 500 MG CAPS Take 1 capsule (500 mg total) by mouth 2 (two) times daily at 8 am and 10 pm.  . meloxicam (MOBIC) 15 MG tablet Take 15 mg by mouth daily.  . mirabegron ER (MYRBETRIQ) 25 MG TB24 tablet Take 1 tablet (25 mg total) by mouth daily. OFFICE VISIT REQUIRED PRIOR TO ANY  FURTHER REFILLS  . montelukast (SINGULAIR) 10 MG tablet TAKE 1 TABLET BY MOUTH EVERY DAY  . omeprazole (PRILOSEC) 40 MG capsule 1 CAPSULE BY MOUTH DAILY  . ondansetron (ZOFRAN) 4 MG tablet Take 1 tablet (4 mg total) by mouth every 8 (eight) hours as needed for nausea or vomiting.  . paliperidone (INVEGA) 6 MG 24 hr tablet Take 6 mg by mouth every morning.  . polyethylene glycol (MIRALAX / GLYCOLAX) packet Take 17 g by mouth 2 (two) times daily.  . prazosin (MINIPRESS) 1 MG capsule Take 1 mg by mouth at bedtime.  Marland Kitchen PROAIR HFA 108 (90 Base) MCG/ACT inhaler INHALE 2 PUFFS INTO THE LUNGS 4 (FOUR) TIMES DAILY.  Marland Kitchen sertraline (ZOLOFT) 100 MG tablet Take 200 mg by mouth every morning.  . traMADol (ULTRAM) 50 MG tablet Take 2 tablets (100 mg total) by mouth every 6 (six) hours as needed for severe pain. Must last 30 days  . traZODone (DESYREL) 50 MG tablet 1 TABLET BY MOUTH AT BEDTIME  . Wheat Dextrin (BENEFIBER) POWD Take 6 g by mouth 3 (three) times daily before meals. (2 tsp = 6 g)   No facility-administered encounter medications on file as of 11/21/2018.     Allergies: Latex, Penicillins, Codeine, Cortisone, and Sulfa antibiotics  There is no height or weight on file to calculate BMI.  There were no vitals taken for this visit. Review of Systems: General:   Denies fever, chills, unexplained weight loss.  Optho/Auditory:   Denies visual changes, blurred vision/LOV Respiratory:   Denies SOB, DOE more than baseline levels.  Cardiovascular:   Denies  chest pain, palpitations, new onset peripheral edema  Gastrointestinal: nausea +, vomiting +, diarrhea -, constipation + Genitourinary: Denies dysuria, freq/ urgency, flank pain or discharge from genitals.  Endocrine:     Denies hot or cold intolerance, polyuria, polydipsia. Musculoskeletal:   Denies unexplained myalgias, joint swelling, unexplained arthralgias, gait problems.  Skin:  Denies rash, suspicious lesions Neurological:     Denies dizziness, unexplained weakness, numbness  Psychiatric/Behavioral:   Denies mood changes, suicidal or homicidal ideations, hallucinations  Observations/Objective: No acute distress noted during the telephone conversation.  Assessment and Plan: Continue all medications as directed. Increase water and fiber intake. Increase daily walking. Decrease saturated fat intake by 50%- re-check lipids at f/u in 2-3 months. Continue f/u with various specialists  Follow Up Instructions: 2-3 months OV with lipid panel   I discussed the assessment and treatment plan with the patient. The patient was provided an opportunity to ask questions and all were answered. The patient agreed with the plan and demonstrated an understanding of the instructions.   The patient was advised to call back or seek an in-person evaluation if the symptoms worsen or if the condition fails to improve as anticipated.  I provided 14 minutes of non-face-to-face time during this encounter.   Esaw Grandchild, NP

## 2018-11-21 NOTE — Assessment & Plan Note (Signed)
>>  ASSESSMENT AND PLAN FOR BENIGN HYPERTENSION WRITTEN ON 11/21/2018  2:23 PM BY DANFORD, [name] D, NP  She reports ambulatory readings- SBP 110-120 DBP 60-80 HR 80-90s She denies acute cardiac sx's Continue Atenolol 25mg  QD, Lisinopril 5mg  QD

## 2018-11-21 NOTE — Assessment & Plan Note (Signed)
Followed by [facility] Mood stable, denies SI/HI

## 2018-11-21 NOTE — Assessment & Plan Note (Signed)
She reports ambulatory readings- SBP 110-120 DBP 60-80 HR 80-90s She denies acute cardiac sx's Continue Atenolol 25mg  QD, Lisinopril 5mg  QD

## 2018-11-25 ENCOUNTER — Ambulatory Visit: Payer: Medicare Other | Admitting: Nurse Practitioner

## 2018-11-26 ENCOUNTER — Ambulatory Visit (INDEPENDENT_AMBULATORY_CARE_PROVIDER_SITE_OTHER): Payer: Medicare Other | Admitting: Nurse Practitioner

## 2018-11-26 ENCOUNTER — Other Ambulatory Visit: Payer: Self-pay

## 2018-11-26 ENCOUNTER — Encounter: Payer: Self-pay | Admitting: Nurse Practitioner

## 2018-11-26 VITALS — BP 126/70 | HR 72 | Temp 97.6°F | Ht 65.75 in | Wt 185.0 lb

## 2018-11-26 DIAGNOSIS — R1310 Dysphagia, unspecified: Secondary | ICD-10-CM | POA: Diagnosis not present

## 2018-11-26 DIAGNOSIS — R6881 Early satiety: Secondary | ICD-10-CM

## 2018-11-26 DIAGNOSIS — Z1159 Encounter for screening for other viral diseases: Secondary | ICD-10-CM | POA: Diagnosis not present

## 2018-11-26 NOTE — Patient Instructions (Addendum)
If you are age 65 or older, your body mass index should be between 23-30. Your Body mass index is 30.09 kg/m. If this is out of the aforementioned range listed, please consider follow up with your Primary Care Provider.  If you are age 45 or younger, your body mass index should be between 19-25. Your Body mass index is 30.09 kg/m. If this is out of the aformentioned range listed, please consider follow up with your Primary Care Provider.   You have been scheduled for an endoscopy. Please follow written instructions given to you at your visit today. If you use inhalers (even only as needed), please bring them with you on the day of your procedure. Your physician has requested that you go to www.startemmi.com and enter the access code given to you at your visit today. This web site gives a general overview about your procedure. However, you should still follow specific instructions given to you by our office regarding your preparation for the procedure.  Thank you for choosing me and [facility].   Tye Savoy, NP

## 2018-11-26 NOTE — Progress Notes (Signed)
ASSESSMENT / PLAN:   63 yo female with early satiety, occasional solid food dysphagia and weight loss of 24 pounds since mid August. Appetite is okay, no associated abdominal pain. No vomiting unless forces herself to eat.  --For further evaluation patient will be scheduled for EGD. The risks and benefits of EGD were discussed and the patient agrees to proceed.   HPI:      Chief Complaint:   Weight loss, gets full easy.   Patient is a 63 yo female with a pmh significant for lymphoma, s/p radiation 2016. No evidence for recurrence on CT can late July 2020. She has a hx of HCV, responded to [facility],  She established care with Dr. Loletha Carrow in January 2019 for evaluation of chronic constipation previously evaluated by [facility] and [facility]. She has colonic inertia and pelvic dyssynergia.   Rachael West is here today for evaluation of weight loss and early satiety. She endorses occasional solid food dysphagia. She was 209 pounds in August, unintentionally down now to 185 pounds. Has a good appetite , just gets full quickly. If force feeds she will vomit. Zofran helps. No abdominal pain to speak of. Complains of GERD symptoms / heartburn. Takes Omeprazole daily but now adding Pepto 2-3 times a day. Still struggles with constipation.  She has an assortment of medications including Linzess, Amitiza, MiraLAX.  Unable to take Amitiza twice daily.  The second dose gives her diarrhea.  She has not tried Amitiza in the morning and MiraLAX in the evening  Data Reviewed:   08/05/18  CMET - nl CBC - nl   Past Medical History:  Diagnosis Date  . Anemia   . Anxiety   . Arthritis   . Asthma   . Depression   . GERD (gastroesophageal reflux disease)   . Hepatitis-C   . History of chemotherapy   . Hypertension   . Insomnia   . Lymphoma ([place]) 12/2013   aggressive lymphoma  . Personal history of non-Hodgkin lymphomas   . Vitamin D deficiency      Past Surgical History:  Procedure Laterality  Date  . ABDOMINAL HYSTERECTOMY    . APPENDECTOMY    . BREAST BIOPSY Left 2015   lymphoma  . COLONOSCOPY  01/17/2011   Procedure: COLONOSCOPY;  Surgeon: Winfield Cunas., MD;  Location: [facility];  Service: Endoscopy;  Laterality: N/A;  . ESOPHAGOGASTRODUODENOSCOPY  01/17/2011   Procedure: ESOPHAGOGASTRODUODENOSCOPY (EGD);  Surgeon: Winfield Cunas., MD;  Location: [facility];  Service: Endoscopy;  Laterality: N/A;  . TRIGGER FINGER RELEASE     Family History  Problem Relation Age of Onset  . Asthma Mother   . Allergies Mother   . Asthma Sister   . Heart disease Father   . Heart disease Brother   . Diabetes Maternal Grandmother   . Diabetes Maternal Grandfather   . Heart disease Paternal Grandmother   . Heart disease Paternal Grandfather   . Colon cancer Paternal Aunt   . Liver cancer Paternal Uncle   . Breast cancer Cousin   . Ovarian cancer Other        great aunt (m)  . Pancreatic cancer Other        great aunt (m)   Social History   Tobacco Use  . Smoking status: Former Smoker    Packs/day: 1.50    Years: 20.00    Pack years: 30.00  Types: Cigarettes    Quit date: 01/09/1990    Years since quitting: 28.8  . Smokeless tobacco: Never Used  Substance Use Topics  . Alcohol use: No    Alcohol/week: 0.0 standard drinks  . Drug use: No   Current Outpatient Medications  Medication Sig Dispense Refill  . atenolol (TENORMIN) 25 MG tablet Take 1 tablet (25 mg total) by mouth daily. 90 tablet 3  . busPIRone (BUSPAR) 5 MG tablet Take 1 tablet (5 mg total) by mouth 2 (two) times daily. (Patient taking differently: Take 5 mg by mouth 3 (three) times daily. )    . Calcium Carbonate-Vit D-Min (GNP CALCIUM 1200) 1200-1000 MG-UNIT CHEW Chew 1,200 mg by mouth daily with breakfast. Take in combination with vitamin D and magnesium. 30 tablet 5  . dimenhyDRINATE (DRAMAMINE) 50 MG tablet Take 50 mg by mouth every 8 (eight) hours as needed.    . hydrOXYzine (VISTARIL) 50 MG  capsule Take 50 mg by mouth at bedtime.    Marland Kitchen ipratropium-albuterol (DUONEB) 0.5-2.5 (3) MG/3ML SOLN INHALE 3 MLS VIA NEBULIZER INTO THE LUNGS. SEE ADMIN INSTRUCTIONS 360 mL 2  . latanoprost (XALATAN) 0.005 % ophthalmic solution Place 1 drop into both eyes daily.    Marland Kitchen linaclotide (LINZESS) 72 MCG capsule Take 1 capsule (72 mcg total) by mouth daily before breakfast. 30 capsule   . lisinopril (ZESTRIL) 5 MG tablet TAKE 1 TABLET BY MOUTH EVERY DAY 90 tablet 0  . lubiprostone (AMITIZA) 24 MCG capsule Take 1 capsule (24 mcg total) by mouth 2 (two) times daily with a meal. Swallow the medication whole. Do not break or chew the medication. 60 capsule 5  . Magnesium 500 MG CAPS Take 1 capsule (500 mg total) by mouth 2 (two) times daily at 8 am and 10 pm. 180 capsule 1  . mirabegron ER (MYRBETRIQ) 25 MG TB24 tablet Take 1 tablet (25 mg total) by mouth daily. Needs to come from the Urologist- no further RFs will be provided 30 tablet 0  . montelukast (SINGULAIR) 10 MG tablet TAKE 1 TABLET BY MOUTH EVERY DAY 90 tablet 3  . omeprazole (PRILOSEC) 40 MG capsule 1 CAPSULE BY MOUTH DAILY  3  . ondansetron (ZOFRAN) 4 MG tablet Take 1 tablet (4 mg total) by mouth every 8 (eight) hours as needed for nausea or vomiting. 21 tablet 0  . paliperidone (INVEGA) 6 MG 24 hr tablet Take 6 mg by mouth every morning.    . polyethylene glycol (MIRALAX / GLYCOLAX) packet Take 17 g by mouth 2 (two) times daily.    . prazosin (MINIPRESS) 1 MG capsule Take 1 mg by mouth at bedtime.    Marland Kitchen PROAIR HFA 108 (90 Base) MCG/ACT inhaler INHALE 2 PUFFS INTO THE LUNGS 4 (FOUR) TIMES DAILY. 18 g 5  . sertraline (ZOLOFT) 100 MG tablet Take 200 mg by mouth every morning.    . traMADol (ULTRAM) 50 MG tablet Take 2 tablets (100 mg total) by mouth every 6 (six) hours as needed for severe pain. Must last 30 days 240 tablet 5  . traZODone (DESYREL) 50 MG tablet 1 TABLET BY MOUTH AT BEDTIME  5  . Wheat Dextrin (BENEFIBER) POWD Take 6 g by mouth 3  (three) times daily before meals. (2 tsp = 6 g) 730 g 5   No current facility-administered medications for this visit.    Allergies  Allergen Reactions  . Latex Hives  . Penicillins Hives  . Codeine Anxiety  . Cortisone Swelling  .  Sulfa Antibiotics      Review of Systems: All systems reviewed and negative except where noted in HPI.   Physical Exam:    Wt Readings from Last 3 Encounters:  11/26/18 185 lb (83.9 kg)  11/21/18 171 lb 9.6 oz (77.8 kg)  08/27/18 209 lb (94.8 kg)    BP 126/70   Pulse 72   Temp 97.6 F (36.4 C)   Ht 5' 5.75" (1.67 m)   Wt 185 lb (83.9 kg)   BMI 30.09 kg/m  Constitutional:  Pleasant female in no acute distress. Psychiatric: Normal mood and affect. Behavior is normal. EENT: Pupils normal.  Conjunctivae are normal. No scleral icterus. Neck supple.  Cardiovascular: Normal rate, regular rhythm. No edema Pulmonary/chest: Effort normal and breath sounds normal. No wheezing, rales or rhonchi. Abdominal: Soft, nondistended, nontender. Bowel sounds active throughout. There are no masses palpable. No hepatomegaly. Neurological: Alert and oriented to person place and time. Skin: Skin is warm and dry. No rashes noted.  Tye Savoy, NP  11/26/2018, 3:55 PM  Cc: Esaw Grandchild, NP

## 2018-11-27 ENCOUNTER — Encounter: Payer: Self-pay | Admitting: Gastroenterology

## 2018-11-28 ENCOUNTER — Encounter: Payer: Self-pay | Admitting: Nurse Practitioner

## 2018-11-29 ENCOUNTER — Ambulatory Visit (INDEPENDENT_AMBULATORY_CARE_PROVIDER_SITE_OTHER): Payer: Medicare Other

## 2018-11-29 DIAGNOSIS — Z1159 Encounter for screening for other viral diseases: Secondary | ICD-10-CM

## 2018-12-02 ENCOUNTER — Telehealth: Payer: Self-pay | Admitting: Gastroenterology

## 2018-12-02 ENCOUNTER — Telehealth: Payer: Self-pay

## 2018-12-02 LAB — SARS CORONAVIRUS 2 (TAT 6-24 HRS): SARS Coronavirus 2: POSITIVE — AB

## 2018-12-02 NOTE — Telephone Encounter (Addendum)
FYI- Dr.Danis Spoke to patient who was unaware that she tested positive for COVID. My phone call was her first notification. 11/24 EGD cancelled. Patient made aware and rescheduled two weeks out on 2/9 at 3:30 pm. Patient currently asymptomatic and told to follow up with PCP if she becomes symptomatic. Patient verbalized understanding. PCP notified of patients positive results.

## 2018-12-02 NOTE — Telephone Encounter (Signed)
-----   Message from Esaw Grandchild, NP sent at 12/02/2018  1:04 PM EST ----- Brand Males, SARS-CoV-2 Positive 11/29/2018 Can you please call [name] and see how she is feeling and review CDC quarantine guidelines. Thanks! Valetta Fuller  ----- Message ----- From: Dalene Seltzer, RN Sent: 12/02/2018  12:54 PM EST To: Esaw Grandchild, NP  Please review results.

## 2018-12-02 NOTE — Telephone Encounter (Signed)
Pt left message with answering service informing that she tested positive for Covid-19. I cancelled her procedure that was scheduled for tomorrow at [facility] with Dr. Loletha Carrow.

## 2018-12-02 NOTE — Telephone Encounter (Signed)
Attempted to contact pt, however VM box is not set up.  Unable to leave a message.  Charyl Bigger, CMA

## 2018-12-03 ENCOUNTER — Encounter: Payer: Medicare Other | Admitting: Gastroenterology

## 2018-12-03 NOTE — Telephone Encounter (Signed)
Spoke with pt who states that she is totally asymptomatic.  Advised pt of CDC quarantine guidelines and red flag symptoms should she develop any.  Pt expressed understanding and is agreeable.  Charyl Bigger, CMA

## 2018-12-03 NOTE — Telephone Encounter (Signed)
Yes, thank you for notifying her while I was out of the office yesterday.  12/18/18 is a good reschedule date, and is keeping with our current policy for asymptomatic COVID positive patients

## 2018-12-03 NOTE — Progress Notes (Signed)
____________________________________________________________  Attending physician addendum:  Thank you for sending this case to me. I have reviewed the entire note, and the outlined plan seems appropriate.  Agree with EGD.  Must also consider medicine side effect/polypharmacy.  Wilfrid Lund, MD  ____________________________________________________________

## 2018-12-10 ENCOUNTER — Other Ambulatory Visit: Payer: Self-pay | Admitting: Gastroenterology

## 2018-12-10 ENCOUNTER — Other Ambulatory Visit: Payer: Self-pay | Admitting: Adult Health

## 2018-12-18 ENCOUNTER — Ambulatory Visit (AMBULATORY_SURGERY_CENTER): Payer: Medicare Other | Admitting: Gastroenterology

## 2018-12-18 ENCOUNTER — Other Ambulatory Visit: Payer: Self-pay

## 2018-12-18 ENCOUNTER — Encounter: Payer: Self-pay | Admitting: Gastroenterology

## 2018-12-18 VITALS — BP 127/75 | HR 86 | Temp 98.9°F | Resp 22 | Ht 65.0 in | Wt 185.0 lb

## 2018-12-18 DIAGNOSIS — R634 Abnormal weight loss: Secondary | ICD-10-CM | POA: Diagnosis not present

## 2018-12-18 DIAGNOSIS — K2970 Gastritis, unspecified, without bleeding: Secondary | ICD-10-CM

## 2018-12-18 DIAGNOSIS — K2950 Unspecified chronic gastritis without bleeding: Secondary | ICD-10-CM | POA: Diagnosis not present

## 2018-12-18 DIAGNOSIS — R6881 Early satiety: Secondary | ICD-10-CM | POA: Diagnosis not present

## 2018-12-18 DIAGNOSIS — K2990 Gastroduodenitis, unspecified, without bleeding: Secondary | ICD-10-CM

## 2018-12-18 MED ORDER — SODIUM CHLORIDE 0.9 % IV SOLN: 500.0000 mL | Freq: Once | INTRAVENOUS | Status: DC

## 2018-12-18 NOTE — Progress Notes (Signed)
Pt tolerated well. VSS. Awake and to recovery. 

## 2018-12-18 NOTE — Progress Notes (Signed)
Called to room to assist during endoscopic procedure.  Patient ID and intended procedure confirmed with present staff. Received instructions for my participation in the procedure from the performing physician.  

## 2018-12-18 NOTE — Patient Instructions (Signed)
HANDOUTS PROVIDED ON: GASTRITIS  THE BIOPSIES TAKEN TODAY HAVE BEEN SENT FOR PATHOLOGY.  THE RESULTS CAN TAKE 2-3 WEEKS TO RECEIVE.  WILL CONSIDER A GASTRIC EMPTYING STUDY DEPENDING ON PATHOLOGY.  YOU MAY RESUME YOUR PREVIOUS DIET AND MEDICATION SCHEDULE.  Keller YOU FOR ALLOWING [place] TO CARE FOR YOU TODAY!!!  YOU HAD AN ENDOSCOPIC PROCEDURE TODAY AT [facility]:   Refer to the procedure report that was given to you for any specific questions about what was found during the examination.  If the procedure report does not answer your questions, please call your gastroenterologist to clarify.  If you requested that your care partner not be given the details of your procedure findings, then the procedure report has been included in a sealed envelope for you to review at your convenience later.  Please Note:  You might notice some irritation and congestion in your nose or some drainage.  This is from the oxygen used during your procedure.  There is no need for concern and it should clear up in a day or so.  SYMPTOMS TO REPORT IMMEDIATELY:   Following upper endoscopy (EGD)  Vomiting of blood or coffee ground material  New chest pain or pain under the shoulder blades  Painful or persistently difficult swallowing  New shortness of breath  Fever of 100F or higher  Black, tarry-looking stools  For urgent or emergent issues, a gastroenterologist can be reached at any hour by calling [phone].   DIET:  We do recommend a small meal at first, but then you may proceed to your regular diet.  Drink plenty of fluids but you should avoid alcoholic beverages for 24 hours.  ACTIVITY:  You should plan to take it easy for the rest of today and you should NOT DRIVE or use heavy machinery until tomorrow (because of the sedation medicines used during the test).    FOLLOW UP: Our staff will call the number listed on your records 48-72 hours following your procedure to check on you and  address any questions or concerns that you may have regarding the information given to you following your procedure. If we do not reach you, we will leave a message.  We will attempt to reach you two times.  During this call, we will ask if you have developed any symptoms of COVID 19. If you develop any symptoms (ie: fever, flu-like symptoms, shortness of breath, cough etc.) before then, please call [phone].  If you test positive for Covid 19 in the 2 weeks post procedure, please call and report this information to [place].    If any biopsies were taken you will be contacted by phone or by letter within the next 1-3 weeks.  Please call us at [phone] if you have not heard about the biopsies in 3 weeks.    SIGNATURES/CONFIDENTIALITY: You and/or your care partner have signed paperwork which will be entered into your electronic medical record.  These signatures attest to the fact that that the information above on your After Visit Summary has been reviewed and is understood.  Full responsibility of the confidentiality of this discharge information lies with you and/or your care-partner.

## 2018-12-18 NOTE — Op Note (Signed)
[facility] Patient Name: Rachael West Procedure Date: 12/18/2018 3:26 PM MRN: [id number] Endoscopist: Mallie Mussel L. Loletha Carrow , MD Age: 63 Referring MD:  Date of Birth: Jun 05, 1955 Gender: Female Account #: [id number] Procedure:                Upper GI endoscopy Indications:              Esophageal dysphagia, Early satiety, Weight loss                            (history of colonic dysmotility and PFD, prior                            evaluation at [facility]) Medicines:                Monitored Anesthesia Care Procedure:                Pre-Anesthesia Assessment:                           - Prior to the procedure, a History and Physical                            was performed, and patient medications and                            allergies were reviewed. The patient's tolerance of                            previous anesthesia was also reviewed. The risks                            and benefits of the procedure and the sedation                            options and risks were discussed with the patient.                            All questions were answered, and informed consent                            was obtained. Prior Anticoagulants: The patient has                            taken no previous anticoagulant or antiplatelet                            agents. ASA Grade Assessment: III - A patient with                            severe systemic disease. After reviewing the risks                            and benefits, the patient was deemed in  satisfactory condition to undergo the procedure.                           After obtaining informed consent, the endoscope was                            passed under direct vision. Throughout the                            procedure, the patient's blood pressure, pulse, and                            oxygen saturations were monitored continuously. The                            Endoscope was  introduced through the mouth, and                            advanced to the second part of duodenum. The upper                            GI endoscopy was accomplished without difficulty.                            The patient tolerated the procedure well. Scope In: Scope Out: Findings:                 The esophagus was normal.                           Patchy inflammation characterized by congestion                            (edema), erythema and granularity was found in the                            gastric body. Biopsies were taken with a cold                            forceps for histology. (Sidney protocol).                           The exam of the stomach was otherwise normal,                            including on retroflexion. The stomach distended                            well with air.                           The examined duodenum was normal. Complications:            No immediate complications. Estimated Blood Loss:     Estimated blood loss was minimal. Impression:               -  Normal esophagus.                           - Gastritis. Biopsied.                           - Normal examined duodenum.                           Poor dentition likely to be contributing to                            dysphagia. Recommendation:           - Patient has a contact number available for                            emergencies. The signs and symptoms of potential                            delayed complications were discussed with the                            patient. Return to normal activities tomorrow.                            Written discharge instructions were provided to the                            patient.                           - Resume previous diet.                           - Continue present medications.                           - Await pathology results. If negative, will                            schedule gastric emptying study. Garris Melhorn L. Loletha Carrow,  MD 12/18/2018 3:45:08 PM This report has been signed electronically.

## 2018-12-18 NOTE — Progress Notes (Signed)
Temp taken by [facility] VS taken by [facility]

## 2018-12-20 ENCOUNTER — Telehealth: Payer: Self-pay | Admitting: *Deleted

## 2018-12-20 ENCOUNTER — Telehealth: Payer: Self-pay

## 2018-12-20 NOTE — Telephone Encounter (Signed)
No answer, unable to leave message °

## 2018-12-20 NOTE — Telephone Encounter (Signed)
Message left

## 2018-12-24 ENCOUNTER — Other Ambulatory Visit: Payer: Self-pay | Admitting: Adult Health

## 2018-12-25 ENCOUNTER — Telehealth: Payer: Self-pay | Admitting: *Deleted

## 2018-12-25 ENCOUNTER — Other Ambulatory Visit: Payer: Self-pay | Admitting: *Deleted

## 2018-12-25 DIAGNOSIS — R634 Abnormal weight loss: Secondary | ICD-10-CM

## 2018-12-25 DIAGNOSIS — R6881 Early satiety: Secondary | ICD-10-CM

## 2018-12-25 MED ORDER — OMEPRAZOLE 40 MG PO CPDR: 40.0000 mg | DELAYED_RELEASE_CAPSULE | Freq: Every day | ORAL | 3 refills | Status: DC

## 2018-12-25 MED ORDER — ONDANSETRON HCL 4 MG PO TABS: ORAL_TABLET | ORAL | 3 refills | Status: DC

## 2018-12-25 NOTE — Telephone Encounter (Signed)
Attempted to call patient to notify her of her new prescriptions. No answer. Unable to LM.

## 2018-12-25 NOTE — Telephone Encounter (Signed)
Spoke to the patient who has been scheduled for her GES on 12/23 at 9:30 am. Patient aware of all instructions concerning this appointment.   Dr. Loletha Carrow,   The patient is requesting a prescription for Omeprazole 40 mg. She reported she has been taking this OTC but it has become expensive since she has to take 2 tablets daily. She also requested Ondansetron for nausea. Please advise.

## 2018-12-25 NOTE — Telephone Encounter (Signed)
Prescriptions for omeprazole and ondansetron sent

## 2018-12-26 NOTE — Telephone Encounter (Signed)
Patient notified of prescriptions sent to pharmacy.

## 2019-01-01 ENCOUNTER — Ambulatory Visit (HOSPITAL_COMMUNITY)
Admission: RE | Admit: 2019-01-01 | Discharge: 2019-01-01 | Disposition: A | Discharge status: Home / Self Care | Payer: Medicare Other | Source: Ambulatory Visit | Attending: Gastroenterology | Admitting: Gastroenterology

## 2019-01-01 ENCOUNTER — Other Ambulatory Visit: Payer: Self-pay

## 2019-01-01 DIAGNOSIS — R634 Abnormal weight loss: Secondary | ICD-10-CM | POA: Insufficient documentation

## 2019-01-01 DIAGNOSIS — R6881 Early satiety: Secondary | ICD-10-CM | POA: Diagnosis not present

## 2019-01-01 MED ORDER — TECHNETIUM TC 99M SULFUR COLLOID
2.0000 | Freq: Once | INTRAVENOUS | Status: AC | PRN
  Administered 2019-01-01: 2 via ORAL

## 2019-01-02 ENCOUNTER — Other Ambulatory Visit: Payer: Self-pay | Admitting: Adult Health

## 2019-01-08 ENCOUNTER — Other Ambulatory Visit: Payer: Self-pay

## 2019-01-08 ENCOUNTER — Ambulatory Visit
Admission: RE | Admit: 2019-01-08 | Discharge: 2019-01-08 | Disposition: A | Discharge status: Home / Self Care | Payer: Medicare Other | Source: Ambulatory Visit | Attending: Adult Health | Admitting: Adult Health

## 2019-01-08 DIAGNOSIS — Z1231 Encounter for screening mammogram for malignant neoplasm of breast: Secondary | ICD-10-CM

## 2019-01-23 ENCOUNTER — Encounter: Payer: Self-pay | Admitting: Pain Medicine

## 2019-01-26 NOTE — Progress Notes (Signed)
Patient: Rachael West  Service Category: E/M  Provider: Gaspar Cola, MD  DOB: Jan 26, 1955  DOS: 01/27/2019  Location: Office  MRN: [id number]  Setting: Ambulatory outpatient  Referring Provider: Esaw Grandchild, NP  Type: Established Patient  Specialty: Interventional Pain Management  PCP: Esaw Grandchild, NP  Location: Remote location  Delivery: TeleHealth     Virtual Encounter - Pain Management PROVIDER NOTE: Information contained herein reflects review and annotations entered in association with encounter. Interpretation of such information and data should be left to medically-trained personnel. Information provided to patient can be located elsewhere in the medical record under "Patient Instructions". Document created using STT-dictation technology, any transcriptional errors that may result from process are unintentional.    Contact & Pharmacy Preferred: [phone] Home: [phone] (home) Mobile: [phone] (mobile) E-mail: [email or web address]  [id number] [address], [place] [address] PhoneSE:[phone] FaxXO:[phone]   Pre-screening  Rachael West offered "in-person" vs "virtual" encounter. She indicated preferring virtual for this encounter.   Reason COVID-19*  Social distancing based on CDC and AMA recommendations.   I contacted [name] on 01/27/2019 via telephone.      I clearly identified myself as Gaspar Cola, MD. I verified that I was speaking with the correct person using two identifiers (Name: Rachael West, and date of birth: [birth year]).  Consent I sought verbal advanced consent from [name] for virtual visit interactions. I informed Rachael West of possible security and privacy concerns, risks, and limitations associated with providing "not-in-person" medical evaluation and management services. I also informed Rachael West of the  availability of "in-person" appointments. Finally, I informed her that there would be a charge for the virtual visit and that she could be  personally, fully or partially, financially responsible for it. [name] expressed understanding and agreed to proceed.   Historic Elements   [name] is a 64 y.o. year old, female patient evaluated today after her last encounter by our practice on 09/30/2018. Ms. Ch  has a past medical history of Anemia, Anxiety, Arthritis, Asthma, Depression, GERD (gastroesophageal reflux disease), Hepatitis-C, History of chemotherapy, Hypertension, Insomnia, Lymphoma ([place]) (12/2013), Personal history of non-Hodgkin lymphomas, and Vitamin D deficiency. She also  has a past surgical history that includes Appendectomy; Trigger finger release; Esophagogastroduodenoscopy (01/17/2011); Colonoscopy (01/17/2011); Abdominal hysterectomy; and Breast biopsy (Left, 2015). Rachael West has a current medication list which includes the following prescription(s): atenolol, buspirone, gnp calcium 1200, dimenhydrinate, hydroxyzine, ipratropium-albuterol, latanoprost, linaclotide, lisinopril, lubiprostone, [START ON 02/04/2019] magnesium, mirabegron er, montelukast, omeprazole, ondansetron, paliperidone, polyethylene glycol, prazosin, proair hfa, sertraline, [START ON 02/04/2019] tramadol, trazodone, and benefiber. She  reports that she quit smoking about 29 years ago. Her smoking use included cigarettes. She has a 30.00 pack-year smoking history. She has never used smokeless tobacco. She reports that she does not drink alcohol or use drugs. Rachael West is allergic to latex; penicillins; codeine; cortisone; and sulfa antibiotics.   HPI  Today, she is being contacted for medication management.  The patient indicates doing well with the current medication regimen. No adverse reactions or side effects reported to the medications.   Pharmacotherapy Assessment   Analgesic: Tramadol50 mg,2tabs (100 mg) PO q 6 hrs (400 mg/day of tramadol) (has enough medication to last until 02/04/2019) MME/day:40mg /day.   Monitoring: Pharmacotherapy: No side-effects or adverse reactions reported. [place] PMP: PDMP reviewed during this encounter.       Compliance: No problems identified. Effectiveness:  Clinically acceptable. Plan: Refer to "POC".  UDS:  Summary  Date Value Ref Range Status  02/06/2018 FINAL  Final    Comment:    ==================================================================== TOXASSURE SELECT 13 (MW) ==================================================================== Test                             Result       Flag       Units Drug Present and Declared for Prescription Verification   Tramadol                       >3759        EXPECTED   ng/mg creat   O-Desmethyltramadol            >3759        EXPECTED   ng/mg creat   N-Desmethyltramadol            >3759        EXPECTED   ng/mg creat    Source of tramadol is a prescription medication.    O-desmethyltramadol and N-desmethyltramadol are expected    metabolites of tramadol. ==================================================================== Test                      Result    Flag   Units      Ref Range   Creatinine              133              mg/dL      >=20 ==================================================================== Declared Medications:  The flagging and interpretation on this report are based on the  following declared medications.  Unexpected results may arise from  inaccuracies in the declared medications.  **Note: The testing scope of this panel includes these medications:  Tramadol (Ultram)  **Note: The testing scope of this panel does not include following  reported medications:  Albuterol (Duoneb)  Albuterol (Proventil)  Atenolol  Calcium carbonate (Calcium Carb/Vitamin D)  Cyclobenzaprine (Flexeril)  Hydroxyzine (Vistaril)  Ipratropium (Duoneb)   Latanoprost (Xalatan)  Linaclotide (Linzess)  Lisinopril  Magnesium  Meloxicam (Mobic)  Montelukast (Singulair)  Omeprazole (Prilosec)  Ondansetron (Zofran)  Paliperidone (Invega)  Polyethylene Glycol (MiraLAX)  Prazosin (Minipress)  Sertraline (Zoloft)  Solifenacin (Vesicare)  Trazodone (Desyrel)  Vitamin D (Calcium Carb/Vitamin D) ==================================================================== For clinical consultation, please call [phone]. ====================================================================    Laboratory Chemistry Profile (12 mo)  Renal: 05/29/2018: BUN/Creatinine Ratio 9 08/05/2018: BUN 14; Creatinine 0.98  Lab Results  Component Value Date   GFRAA >60 08/05/2018   GFRNONAA >60 08/05/2018   Hepatic: 08/05/2018: Albumin 3.5 Lab Results  Component Value Date   AST 21 08/05/2018   ALT 15 08/05/2018   Other: No results found for requested labs within last 8760 hours. Note: Above Lab results reviewed.  Imaging  MM 3D SCREEN BREAST BILATERAL CLINICAL DATA:  Screening.  EXAM: DIGITAL SCREENING BILATERAL MAMMOGRAM WITH TOMO AND CAD  COMPARISON:  Previous exam(s).  ACR Breast Density Category b: There are scattered areas of fibroglandular density.  FINDINGS: There are no findings suspicious for malignancy. Images were processed with CAD.  IMPRESSION: No mammographic evidence of malignancy. A result letter of this screening mammogram will be mailed directly to the patient.  RECOMMENDATION: Screening mammogram in one year. (Code:SM-B-01Y)  BI-RADS CATEGORY  1: Negative.  Electronically Signed   By: Audie Pinto M.D.   On: 01/08/2019 15:55   Assessment  The primary encounter diagnosis was Chronic low back pain (Primary Area of Pain) (Bilateral) (L>R) w/o sciatica. Diagnoses of Lumbar facet syndrome (Bilateral) (L>R), Chronic lower extremity pain (Secondary Area of Pain) (Left), Chronic neck pain (Fourth Area of Pain)  (Bilateral) (L>R), Cervical facet syndrome (Bilateral) (R>L), Chronic upper back pain (Tertiary Area of Pain) (Bilateral) (L>R), Chronic pain syndrome, Therapeutic opioid-induced constipation (OIC), Chronic musculoskeletal pain, and Vitamin D deficiency were also pertinent to this visit.  Plan of Care  Problem-specific:  No problem-specific Assessment & Plan notes found for this encounter.  I am having [name] maintain her sertraline, paliperidone, prazosin, hydrOXYzine, traZODone, busPIRone, linaclotide, polyethylene glycol, latanoprost, ProAir HFA, montelukast, dimenhyDRINATE, ipratropium-albuterol, mirabegron ER, atenolol, omeprazole, ondansetron, lisinopril, Magnesium, traMADol, lubiprostone, Benefiber, and GNP Calcium 1200.  Pharmacotherapy (Medications Ordered): Meds ordered this encounter  Medications  . Magnesium 500 MG CAPS    Sig: Take 1 capsule (500 mg total) by mouth 2 (two) times daily at 8 am and 10 pm.    Dispense:  180 capsule    Refill:  3    Fill one day early if pharmacy is closed on scheduled refill date. May substitute for generic if available.  . traMADol (ULTRAM) 50 MG tablet    Sig: Take 2 tablets (100 mg total) by mouth every 6 (six) hours as needed for severe pain. Must last 30 days    Dispense:  240 tablet    Refill:  5    Chronic Pain: STOP Act (Not applicable) Fill 1 day early if closed on refill date. Do not fill until: 02/04/2019. To last until: 08/03/2019. Avoid benzodiazepines within 8 hours of opioids  . lubiprostone (AMITIZA) 24 MCG capsule    Sig: Take 1 capsule (24 mcg total) by mouth 2 (two) times daily with a meal. Swallow the medication whole. Do not break or chew the medication.    Dispense:  60 capsule    Refill:  5    Fill one day early if pharmacy is closed on scheduled refill date. May substitute for generic if available. Instruct patient to go down to QD if loose stools becomes a problem.  . Wheat Dextrin (BENEFIBER) POWD    Sig:  Take 6 g by mouth 3 (three) times daily before meals. (2 tsp = 6 g)    Dispense:  730 g    Refill:  10    Fill one day early if pharmacy is closed on scheduled refill date. May substitute for generic if available.  . Calcium Carbonate-Vit D-Min (GNP CALCIUM 1200) 1200-1000 MG-UNIT CHEW    Sig: Chew 1,200 mg by mouth daily with breakfast. Take in combination with vitamin D and magnesium.    Dispense:  30 tablet    Refill:  10    Fill one day early if pharmacy is closed on scheduled refill date. May substitute for generic if available.   Orders:  No orders of the defined types were placed in this encounter.  Follow-up plan:   No follow-ups on file.      Interventional management options:  Considering:   NOTE: LATEX ALLERGY & Hx Hepatitis C (w/ cirrhosis) Diagnostic left LESI Possible bilateral lumbar facetRFA Diagnostic bilateral thoracic facet block Possiblebilateral thoracic facetRFA Possible bilateral cervical facetRFA Diagnostic bilateral GONB Diagnostic trigger point injections   Palliative PRN treatment(s):   Palliative/diagnosticbilateral lumbar facet block#3 Palliative/diagnostic bilateralcervicalfacet block#2    Recent Visits No visits were found meeting these conditions.  Showing recent visits within past 90 days and  meeting all other requirements   Today's Visits Date Type Provider Dept  01/27/19 Telemedicine Milinda Pointer, MD [facility]  Showing today's visits and meeting all other requirements   Future Appointments No visits were found meeting these conditions.  Showing future appointments within next 90 days and meeting all other requirements   I discussed the assessment and treatment plan with the patient. The patient was provided an opportunity to ask questions and all were answered. The patient agreed with the plan and demonstrated an understanding of the instructions.  Patient advised to call back or seek an in-person  evaluation if the symptoms or condition worsens.  Total duration of non-face-to-face encounter: 12 minutes.  Note by: Gaspar Cola, MD Date: 01/27/2019; Time: 9:28 AM

## 2019-01-27 ENCOUNTER — Other Ambulatory Visit: Payer: Self-pay

## 2019-01-27 ENCOUNTER — Ambulatory Visit: Payer: Medicare Other | Attending: Pain Medicine | Admitting: Pain Medicine

## 2019-01-27 DIAGNOSIS — G8929 Other chronic pain: Secondary | ICD-10-CM

## 2019-01-27 DIAGNOSIS — E559 Vitamin D deficiency, unspecified: Secondary | ICD-10-CM

## 2019-01-27 DIAGNOSIS — T402X5A Adverse effect of other opioids, initial encounter: Secondary | ICD-10-CM

## 2019-01-27 DIAGNOSIS — M7918 Myalgia, other site: Secondary | ICD-10-CM

## 2019-01-27 DIAGNOSIS — M47816 Spondylosis without myelopathy or radiculopathy, lumbar region: Secondary | ICD-10-CM

## 2019-01-27 DIAGNOSIS — M542 Cervicalgia: Secondary | ICD-10-CM | POA: Diagnosis not present

## 2019-01-27 DIAGNOSIS — M79605 Pain in left leg: Secondary | ICD-10-CM | POA: Diagnosis not present

## 2019-01-27 DIAGNOSIS — M47812 Spondylosis without myelopathy or radiculopathy, cervical region: Secondary | ICD-10-CM

## 2019-01-27 DIAGNOSIS — M545 Low back pain: Secondary | ICD-10-CM

## 2019-01-27 DIAGNOSIS — K5903 Drug induced constipation: Secondary | ICD-10-CM

## 2019-01-27 DIAGNOSIS — M549 Dorsalgia, unspecified: Secondary | ICD-10-CM

## 2019-01-27 DIAGNOSIS — G894 Chronic pain syndrome: Secondary | ICD-10-CM

## 2019-01-27 MED ORDER — GNP CALCIUM 1200 1200-1000 MG-UNIT PO CHEW: 1200.0000 mg | CHEWABLE_TABLET | Freq: Every day | ORAL | 10 refills | Status: DC

## 2019-01-27 MED ORDER — TRAMADOL HCL 50 MG PO TABS: 100.0000 mg | ORAL_TABLET | Freq: Four times a day (QID) | ORAL | 5 refills | Status: DC | PRN

## 2019-01-27 MED ORDER — BENEFIBER PO POWD: 6.0000 g | Freq: Three times a day (TID) | ORAL | 10 refills | Status: DC

## 2019-01-27 MED ORDER — LUBIPROSTONE 24 MCG PO CAPS: 24.0000 ug | ORAL_CAPSULE | Freq: Two times a day (BID) | ORAL | 5 refills | Status: DC

## 2019-01-27 MED ORDER — MAGNESIUM 500 MG PO CAPS: 500.0000 mg | ORAL_CAPSULE | Freq: Two times a day (BID) | ORAL | 3 refills | Status: DC

## 2019-01-29 ENCOUNTER — Encounter: Payer: Medicare Other | Admitting: Pain Medicine

## 2019-03-04 ENCOUNTER — Other Ambulatory Visit: Payer: Self-pay | Admitting: Adult Health

## 2019-03-07 ENCOUNTER — Telehealth: Payer: Self-pay | Admitting: Adult Health

## 2019-03-07 NOTE — Telephone Encounter (Signed)
Please see A1C level and advise what you would like done. AS, CMA

## 2019-03-07 NOTE — Telephone Encounter (Signed)
Rachael West went to pt's home for screening chk up & cld concerned that Blood Sugar reading was 6.0  when normal fr women is 5.7--also questions who is New PCP since [name]'s departure/ advised them Dr. Raliegh Scarlet is absording [name]'s pt until NP-C Gypsy Balsam is in place in late March .  --forwarding FYI to med asst .  --glh

## 2019-03-09 NOTE — Telephone Encounter (Signed)
Nothing needs to be done

## 2019-03-28 ENCOUNTER — Telehealth: Payer: Self-pay | Admitting: Adult Health

## 2019-03-28 NOTE — Telephone Encounter (Signed)
Attempted to contact pt to schedule Appt as request on voicemail left by patient while office was clsd for lunch--No answer -- left pt message to call us @ her convenience to set appt date/time.  --FYI to staff.  --Dion Body

## 2019-03-31 ENCOUNTER — Other Ambulatory Visit: Payer: Self-pay | Admitting: Adult Health

## 2019-03-31 ENCOUNTER — Other Ambulatory Visit: Payer: Self-pay | Admitting: Gastroenterology

## 2019-03-31 MED ORDER — ATENOLOL 25 MG PO TABS: ORAL_TABLET | ORAL | 0 refills | Status: DC

## 2019-04-02 ENCOUNTER — Other Ambulatory Visit: Payer: Self-pay | Admitting: Adult Health

## 2019-04-14 DIAGNOSIS — H401121 Primary open-angle glaucoma, left eye, mild stage: Secondary | ICD-10-CM | POA: Diagnosis not present

## 2019-04-14 DIAGNOSIS — H2513 Age-related nuclear cataract, bilateral: Secondary | ICD-10-CM | POA: Diagnosis not present

## 2019-04-14 DIAGNOSIS — R6889 Other general symptoms and signs: Secondary | ICD-10-CM | POA: Diagnosis not present

## 2019-04-14 DIAGNOSIS — H40001 Preglaucoma, unspecified, right eye: Secondary | ICD-10-CM | POA: Diagnosis not present

## 2019-04-22 ENCOUNTER — Other Ambulatory Visit: Payer: Self-pay | Admitting: Adult Health

## 2019-04-23 ENCOUNTER — Other Ambulatory Visit: Payer: Self-pay | Admitting: Adult Health

## 2019-04-23 ENCOUNTER — Other Ambulatory Visit: Payer: Self-pay | Admitting: Hematology and Oncology

## 2019-04-24 MED ORDER — LISINOPRIL 5 MG PO TABS: 5.0000 mg | ORAL_TABLET | Freq: Every day | ORAL | 0 refills | Status: DC

## 2019-04-28 ENCOUNTER — Encounter: Payer: Self-pay | Admitting: Family Medicine

## 2019-04-28 ENCOUNTER — Other Ambulatory Visit: Payer: Self-pay

## 2019-04-28 ENCOUNTER — Ambulatory Visit (INDEPENDENT_AMBULATORY_CARE_PROVIDER_SITE_OTHER): Payer: Medicare Other | Admitting: Family Medicine

## 2019-04-28 VITALS — BP 111/84 | HR 86 | Ht 65.5 in | Wt 179.0 lb

## 2019-04-28 DIAGNOSIS — K5903 Drug induced constipation: Secondary | ICD-10-CM | POA: Diagnosis not present

## 2019-04-28 DIAGNOSIS — F39 Unspecified mood [affective] disorder: Secondary | ICD-10-CM | POA: Diagnosis not present

## 2019-04-28 DIAGNOSIS — I10 Essential (primary) hypertension: Secondary | ICD-10-CM

## 2019-04-28 DIAGNOSIS — E785 Hyperlipidemia, unspecified: Secondary | ICD-10-CM | POA: Diagnosis not present

## 2019-04-28 DIAGNOSIS — J4520 Mild intermittent asthma, uncomplicated: Secondary | ICD-10-CM

## 2019-04-28 MED ORDER — FLUTICASONE-SALMETEROL 250-50 MCG/DOSE IN AEPB: 1.0000 | INHALATION_SPRAY | Freq: Two times a day (BID) | RESPIRATORY_TRACT | 2 refills | Status: DC

## 2019-04-28 NOTE — Patient Instructions (Signed)
Your goal blood pressure should be around 135/85 or less on a regular basis.           Hypertension Hypertension, commonly called high blood pressure, is when the force of blood pumping through the arteries is too strong. The arteries are the blood vessels that carry blood from the heart throughout the body. Hypertension forces the heart to work harder to pump blood and may cause arteries to become narrow or stiff. Having untreated or uncontrolled hypertension can cause heart attacks, strokes, kidney disease, and other problems. A blood pressure reading consists of a higher number over a lower number. Ideally, your blood pressure should be below 120/80. The first ("top") number is called the systolic pressure. It is a measure of the pressure in your arteries as your heart beats. The second ("bottom") number is called the diastolic pressure. It is a measure of the pressure in your arteries as the heart relaxes. What are the causes? The cause of this condition is not known. What increases the risk? Some risk factors for high blood pressure are under your control. Others are not. Factors you can change  Smoking.  Having type 2 diabetes mellitus, high cholesterol, or both.  Not getting enough exercise or physical activity.  Being overweight.  Having too much fat, sugar, calories, or salt (sodium) in your diet.  Drinking too much alcohol. Factors that are difficult or impossible to change  Having chronic kidney disease.  Having a family history of high blood pressure.  Age. Risk increases with age.  Race. You may be at higher risk if you are African-American.  Gender. Men are at higher risk than women before age 29. After age 23, women are at higher risk than men.  Having obstructive sleep apnea.  Stress. What are the signs or symptoms? Extremely high blood pressure (hypertensive crisis) may cause:  Headache.  Anxiety.  Shortness of breath.  Nosebleed.  Nausea and  vomiting.  Severe chest pain.  Jerky movements you cannot control (seizures).  How is this diagnosed? This condition is diagnosed by measuring your blood pressure while you are seated, with your arm resting on a surface. The cuff of the blood pressure monitor will be placed directly against the skin of your upper arm at the level of your heart. It should be measured at least twice using the same arm. Certain conditions can cause a difference in blood pressure between your right and left arms. Certain factors can cause blood pressure readings to be lower or higher than normal (elevated) for a short period of time:  When your blood pressure is higher when you are in a health care provider's office than when you are at home, this is called white coat hypertension. Most people with this condition do not need medicines.  When your blood pressure is higher at home than when you are in a health care provider's office, this is called masked hypertension. Most people with this condition may need medicines to control blood pressure.  If you have a high blood pressure reading during one visit or you have normal blood pressure with other risk factors:  You may be asked to return on a different day to have your blood pressure checked again.  You may be asked to monitor your blood pressure at home for 1 week or longer.  If you are diagnosed with hypertension, you may have other blood or imaging tests to help your health care provider understand your overall risk for other conditions. How is  this treated? This condition is treated by making healthy lifestyle changes, such as eating healthy foods, exercising more, and reducing your alcohol intake. Your health care provider may prescribe medicine if lifestyle changes are not enough to get your blood pressure under control, and if:  Your systolic blood pressure is above 130.  Your diastolic blood pressure is above 80.  Your personal target blood pressure  may vary depending on your medical conditions, your age, and other factors. Follow these instructions at home: Eating and drinking  Eat a diet that is high in fiber and potassium, and low in sodium, added sugar, and fat. An example eating plan is called the DASH (Dietary Approaches to Stop Hypertension) diet. To eat this way: ? Eat plenty of fresh fruits and vegetables. Try to fill half of your plate at each meal with fruits and vegetables. ? Eat whole grains, such as whole wheat pasta, brown rice, or whole grain bread. Fill about one quarter of your plate with whole grains. ? Eat or drink low-fat dairy products, such as skim milk or low-fat yogurt. ? Avoid fatty cuts of meat, processed or cured meats, and poultry with skin. Fill about one quarter of your plate with lean proteins, such as fish, chicken without skin, beans, eggs, and tofu. ? Avoid premade and processed foods. These tend to be higher in sodium, added sugar, and fat.  Reduce your daily sodium intake. Most people with hypertension should eat less than 1,500 mg of sodium a day.  Limit alcohol intake to no more than 1 drink a day for nonpregnant women and 2 drinks a day for men. One drink equals 12 oz of beer, 5 oz of wine, or 1 oz of hard liquor. Lifestyle  Work with your health care provider to maintain a healthy body weight or to lose weight. Ask what an ideal weight is for you.  Get at least 30 minutes of exercise that causes your heart to beat faster (aerobic exercise) most days of the week. Activities may include walking, swimming, or biking.  Include exercise to strengthen your muscles (resistance exercise), such as pilates or lifting weights, as part of your weekly exercise routine. Try to do these types of exercises for 30 minutes at least 3 days a week.  Do not use any products that contain nicotine or tobacco, such as cigarettes and e-cigarettes. If you need help quitting, ask your health care provider.  Monitor your  blood pressure at home as told by your health care provider.  Keep all follow-up visits as told by your health care provider. This is important. Medicines  Take over-the-counter and prescription medicines only as told by your health care provider. Follow directions carefully. Blood pressure medicines must be taken as prescribed.  Do not skip doses of blood pressure medicine. Doing this puts you at risk for problems and can make the medicine less effective.  Ask your health care provider about side effects or reactions to medicines that you should watch for. Contact a health care provider if:  You think you are having a reaction to a medicine you are taking.  You have headaches that keep coming back (recurring).  You feel dizzy.  You have swelling in your ankles.  You have trouble with your vision. Get help right away if:  You develop a severe headache or confusion.  You have unusual weakness or numbness.  You feel faint.  You have severe pain in your chest or abdomen.  You vomit repeatedly.  You  have trouble breathing. Summary  Hypertension is when the force of blood pumping through your arteries is too strong. If this condition is not controlled, it may put you at risk for serious complications.  Your personal target blood pressure may vary depending on your medical conditions, your age, and other factors. For most people, a normal blood pressure is less than 120/80.  Hypertension is treated with lifestyle changes, medicines, or a combination of both. Lifestyle changes include weight loss, eating a healthy, low-sodium diet, exercising more, and limiting alcohol. This information is not intended to replace advice given to you by your health care provider. Make sure you discuss any questions you have with your health care provider. Document Released: 12/26/2004 Document Revised: 11/24/2015 Document Reviewed: 11/24/2015 Elsevier Interactive Patient Education  2018 [employer].    How to Take Your Blood Pressure  Blood pressure is a measurement of how strongly your blood is pressing against the walls of your arteries. Arteries are blood vessels that carry blood from your heart throughout your body. Your health care provider takes your blood pressure at each office visit. You can also take your own blood pressure at home with a blood pressure machine. You may need to take your own blood pressure:  To confirm a diagnosis of high blood pressure (hypertension).  To monitor your blood pressure over time.  To make sure your blood pressure medicine is working.  Supplies needed: To take your blood pressure, you will need a blood pressure machine. You can buy a blood pressure machine, or blood pressure monitor, at most drugstores or online. There are several types of home blood pressure monitors. When choosing one, consider the following:  Choose a monitor that has an arm cuff.  Choose a monitor that wraps snugly around your upper arm. You should be able to fit only one finger between your arm and the cuff.  Do not choose a monitor that measures your blood pressure from your wrist or finger.  Your health care provider can suggest a reliable monitor that will meet your needs. How to prepare To get the most accurate reading, avoid the following for 30 minutes before you check your blood pressure:  Drinking caffeine.  Drinking alcohol.  Eating.  Smoking.  Exercising.  Five minutes before you check your blood pressure:  Empty your bladder.  Sit quietly without talking in a dining chair, rather than in a soft couch or armchair.  How to take your blood pressure To check your blood pressure, follow the instructions in the manual that came with your blood pressure monitor. If you have a digital blood pressure monitor, the instructions may be as follows: 1. Sit up straight. 2. Place your feet on the floor. Do not cross your ankles or legs. 3. Rest your  left arm at the level of your heart on a table or desk or on the arm of a chair. 4. Pull up your shirt sleeve. 5. Wrap the blood pressure cuff around the upper part of your left arm, 1 inch (2.5 cm) above your elbow. It is best to wrap the cuff around bare skin. 6. Fit the cuff snugly around your arm. You should be able to place only one finger between the cuff and your arm. 7. Position the cord inside the groove of your elbow. 8. Press the power button. 9. Sit quietly while the cuff inflates and deflates. 10. Read the digital reading on the monitor screen and write it down (record it). 11. Wait  2-3 minutes, then repeat the steps, starting at step 1.  What does my blood pressure reading mean? A blood pressure reading consists of a higher number over a lower number. Ideally, your blood pressure should be below 120/80. The first ("top") number is called the systolic pressure. It is a measure of the pressure in your arteries as your heart beats. The second ("bottom") number is called the diastolic pressure. It is a measure of the pressure in your arteries as the heart relaxes. Blood pressure is classified into four stages. The following are the stages for adults who do not have a short-term serious illness or a chronic condition. Systolic pressure and diastolic pressure are measured in a unit called mm Hg. Normal  Systolic pressure: below 123456.  Diastolic pressure: below 80. Elevated  Systolic pressure: [id number].  Diastolic pressure: below 80. Hypertension stage 1  Systolic pressure: 0000000.  Diastolic pressure: [id number]. Hypertension stage 2  Systolic pressure: [id number] or above.  Diastolic pressure: 90 or above. You can have prehypertension or hypertension even if only the systolic or only the diastolic number in your reading is higher than normal. Follow these instructions at home:  Check your blood pressure as often as recommended by your health care provider.  Take your monitor to the  next appointment with your health care provider to make sure: ? That you are using it correctly. ? That it provides accurate readings.  Be sure you understand what your goal blood pressure numbers are.  Tell your health care provider if you are having any side effects from blood pressure medicine. Contact a health care provider if:  Your blood pressure is consistently high. Get help right away if:  Your systolic blood pressure is higher than 180.  Your diastolic blood pressure is higher than 110. This information is not intended to replace advice given to you by your health care provider. Make sure you discuss any questions you have with your health care provider. Document Released: 06/04/2015 Document Revised: 08/17/2015 Document Reviewed: 06/04/2015 Elsevier Interactive Patient Education  [employer].

## 2019-04-28 NOTE — Progress Notes (Signed)
Telehealth office visit note for Rachael West, D.O- at Primary Care at [facility]   I connected with current patient today and verified that I am speaking with the correct person   . Location of the patient: Home . Location of the provider: Office - This visit type was conducted due to national recommendations for restrictions regarding the COVID-19 Pandemic (e.g. social distancing) in an effort to limit this patient's exposure and mitigate transmission in our community.    - No physical exam could be performed with this format, beyond that communicated to [place] by the patient/ family members as noted.   - Additionally my office staff/ schedulers were to discuss with the patient that there may be a monetary charge related to this service, depending on their medical insurance.  My understanding is that patient understood and consented to proceed.     _________________________________________________________________________________   History of Present Illness:  I, Toni Amend, am serving as Education administrator for [name].  She's dizzy and SOB sometimes "because my chest is tight," but notes "I haven't done my nebulizer today, so that might have something to do with it."  Denies cardiac symptoms; denies chest pain.  Denies tightness in the chest during exercise.  States she has "just the asthma."  - Mood Management She is managed by [employer].  Her mood has been alright.  Notes that they gave her some Buspar recently.  She feels overall that her mood is "alright, okay."  - Chronic Constipation Patient continues management on Linzess.  States that if she skips days, she is really uncomfortable.  She continues taking opioids for her chronic back pain.  Says "he gave me some probiotics with fiber" for additional relief.  - RAD She continues the Singulair daily.  She uses her rescue inhaler in the morning and in the evening, sometimes in the afternoon, too.  Says she stays in the  house, but her breathing problems / asthma is still bad.  "Saturday I went out," and was having problems yesterday.  - LE Swelling Says her feet swelled up after running out of lisinopril.  She has not picked up her refill yet.  HPI:  Hypertension:  -  Her blood pressure at home has been running: Notes that her blood pressure has come down since the reading taken for her intake.  It is now 111/84, with a pulse of 86, down from 154/106 earlier today.  Per patient, her blood pressure has been up in the 150's about once or twice per week.  The rest of the time, her blood pressure is normal.  She believes it is related to if she eats certain things.  - Patient reports good compliance with medication and/or lifestyle modification  - Her denies acute concerns or problems related to treatment plan  - She denies new onset of: chest pain, exercise intolerance, shortness of breath, dizziness, visual changes, headache, lower extremity swelling or claudication.   Last 3 blood pressure readings in our office are as follows: BP Readings from Last 3 Encounters:  04/28/19 111/84  12/18/18 127/75  11/26/18 126/70   Filed Weights   04/28/19 1028  Weight: 179 lb (81.2 kg)    HPI:  Hyperlipidemia:  Most recent cholesterol panel was:  Lab Results  Component Value Date   CHOL 187 05/29/2018   HDL 37 (L) 05/29/2018   LDLCALC 125 (H) 05/29/2018   TRIG 126 05/29/2018   CHOLHDL 5.1 (H) 05/29/2018   Hepatic Function Latest  Ref Rng & Units 08/05/2018 05/29/2018 08/29/2017  Total Protein 6.5 - 8.1 g/dL 7.5 6.9 7.4  Albumin 3.5 - 5.0 g/dL 3.5 4.1 4.1  AST 15 - 41 U/L '21 24 26  '$ ALT 0 - 44 U/L 15 13 -  Alk Phosphatase 38 - 126 U/L 72 82 80  Total Bilirubin 0.3 - 1.2 mg/dL 0.3 0.2 0.4  Bilirubin, Direct 0.0 - 0.3 mg/dL - - -      GAD 7 : Generalized Anxiety Score 04/28/2019 11/21/2018  Nervous, Anxious, on Edge 3 3  Control/stop worrying 3 3  Worry too much - different things 3 3  Trouble relaxing  3 3  Restless 3 3  Easily annoyed or irritable 0 0  Afraid - awful might happen 2 3  Total GAD 7 Score 17 18  Anxiety Difficulty Very difficult Somewhat difficult    Depression screen [facility] 2/9 04/28/2019 11/21/2018 08/27/2018 07/31/2018 04/29/2018  Decreased Interest 3 1 0 3 2  Down, Depressed, Hopeless 2 1 0 2 2  PHQ - 2 Score 5 2 0 5 4  Altered sleeping 3 3 - 3 3  Tired, decreased energy 3 3 - 3 3  Change in appetite 3 3 - 3 2  Feeling bad or failure about yourself  2 1 - 1 2  Trouble concentrating 3 1 - 1 2  Moving slowly or fidgety/restless 3 3 - 2 2  Suicidal thoughts 0 0 - 0 1  PHQ-9 Score 22 16 - 18 19  Difficult doing work/chores Very difficult Somewhat difficult - Somewhat difficult Very difficult  Some recent data might be hidden      Impression and Recommendations:     1. Essential hypertension   2. Intermittent asthma, unspecified asthma severity, unspecified whether complicated   3. Mood disorder ([place])   4. Therapeutic opioid-induced constipation (OIC)   5. Hyperlipidemia, unspecified hyperlipidemia type      Of note, this is my first time meeting patient.  Patient is new to me and was previously being cared for at our office by an NP, who no longer works at [facility].   Will be seen by Lorrene Reid, PA-C in future.   - Last seen 11/21/2018.  Essential Hypertension - Blood pressure elevated on intake today. - BP stable on re-check at 111/84.  - Discussed goal BP of consistently under 135/85.  - Patient will continue current treatment regimen.  See med list.  - Counseled patient on pathophysiology of disease and discussed various treatment options, which always includes dietary and lifestyle modification as first line.   - Lifestyle changes such as low sodium (DASH), heart healthy diets and engaging in a regular exercise program discussed extensively with patient.   - Discussed critical need of daily BP monitoring.  - Ambulatory blood  pressure monitoring encouraged at least 3 times weekly.  Keep log and bring in every office visit.  Reminded patient that if they ever feel poorly in any way, to check their blood pressure and pulse.  - Handouts provided at patient's desire and/or told to go online at the [place] website for further information.  - We will continue to monitor.   Intermittent Asthma - Continues treatment with Singulair and rescue inhaler, along with nebulizer.  - Per patient, using rescue albuterol 2-3 times per day. - Discussed goal of using rescue inhaler 2-3 times per week.  - Per patient, used Advair in the past, but this prescription stopped being given to  her. - Denies S-E to Advair in the past.  She is open to resuming management.  - Resume Advair today.  See med list. - Patient denies adverse S-E to Advair in the past. - Discussed that other options similar to Advair are available if needed.  - Will continue to monitor.   Mood Disorder - Followed by [facility]  - Per patient, recently started Buspar. - Continue treatment plan as established.  See med list. - Treatment plan will continue to be managed by [facility].  - Patient knows to follow up with specialist closely if she is not feeling improvements on current management.  - Will continue to monitor alongside specialist.   Therapeutic Opioid-Induced Constipation - Follows up with GI.  - Continue management as established by GI specialist.  See med list.  - Discussed importance of adequate hydration to help alleviate and prevent constipation.  - Will continue to monitor alongside specialist.   Hyperlipidemia - Last FLP obtained 11 months ago, will need repeat near future.  - Pt will continue current treatment regimen for now and discuss with new provider what future txmnt plan is best.  See med list.  - Encouraged patient to follow AHA guidelines for regular exercise and also engage in  weight loss if BMI above 25.   - We will continue to monitor and re-check in 2-3 months as discussed.    F/Up Recommendations - Return in 3 months for progress after resuming asthma management on Advair. - Return near future for Medicare Wellness and full fasting blood work.    - As part of my medical decision making, I reviewed the following data within the [place] History obtained from pt, Labs reviewed, Radiograph/ tests reviewed if applicable and OV notes from prior OV's with previous PCP, as well as other specialists she/he has seen since seeing PCP last OV, were all reviewed and used in my medical decision making process today.    - Additionally, when appropriate, discussion had with patient regarding our treatment plan, and their biases/concerns about that plan were used in my medical decision making today.    - The patient agreed with the plan and demonstrated an understanding of the instructions.   No barriers to understanding were identified.     - The patient was advised to call back or seek an in-person evaluation if the symptoms worsen or if the condition fails to improve as anticipated.   Return for needs FBW at [place] 2.5 mo (restart advair for RAD).    Meds ordered this encounter  Medications  . Fluticasone-Salmeterol (ADVAIR DISKUS) 250-50 MCG/DOSE AEPB    Sig: Inhale 1 puff into the lungs 2 (two) times daily.    Dispense:  1 each    Refill:  2    Time spent on visit including pre-visit chart review and post-visit care was 18 minutes.   Note:  This note was prepared with assistance of Dragon voice recognition software. Occasional wrong-word or sound-a-like substitutions may have occurred due to the inherent limitations of voice recognition software.  The [place] was signed into law in 2016 which includes the topic of electronic health records.  This provides immediate access to information in MyChart.  This  includes consultation notes, operative notes, office notes, lab results and pathology reports.  If you have any questions about what you read please let us know at your next visit or call us at the office.  We are  right here with you.  This document serves as a record of services personally performed by Rachael Dance, DO. It was created on her behalf by Toni Amend, a trained medical scribe. The creation of this record is based on the scribe's personal observations and the provider's statements to them.    The above documentation from Toni Amend, medical scribe, has been reviewed by Marjory Sneddon, D.O.   __________________________________________________________________________________     Patient Care Team    Relationship Specialty Notifications Start End  Esaw Grandchild, NP PCP - General Family Medicine  03/21/17   Laurence Spates, MD (Inactive) Consulting Physician Gastroenterology  12/01/14   Doran Stabler, MD Consulting Physician Gastroenterology  05/01/17   Elsie Saas, MD Consulting Physician Orthopedic Surgery  05/01/17      -Vitals obtained; medications/ allergies reconciled;  personal medical, social, Sx etc.histories were updated by CMA, reviewed by me and are reflected in chart   Patient Active Problem List   Diagnosis Date Noted  . Mood disorder ([place]) 04/28/2019  . Chronic low back pain (Primary Area of Pain) (Bilateral) (L>R) w/o sciatica 09/30/2018  . Chronic musculoskeletal pain 07/31/2018  . Therapeutic opioid-induced constipation (OIC) 07/31/2018  . Encounter for Medicare annual wellness exam 05/06/2018  . Gait disturbance 11/28/2017  . Cervical facet syndrome (Bilateral) (R>L) 10/24/2017  . Spondylosis without myelopathy or radiculopathy, cervical region 10/24/2017  . Latex precautions, history of latex allergy 10/09/2017  . Elevated uric acid in blood 09/27/2017  . Chronic idiopathic gout 09/27/2017  . DDD (degenerative disc  disease), cervical 09/27/2017  . 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 Chronic shoulder pain (Bilateral) (R>L) 09/26/2017  . Chronic hip pain (Bilateral) (L>R) 09/26/2017  . Cervicalgia 09/26/2017  . DDD (degenerative disc disease), lumbar 09/25/2017  . Lumbar facet arthropathy 09/25/2017  . Lumbar facet syndrome (Bilateral) (L>R) 09/25/2017  . Spondylosis without myelopathy or radiculopathy, lumbar region 09/25/2017  . DDD (degenerative disc disease), thoracic 09/25/2017  . Thoracolumbar dextroscoliosis 09/25/2017  . Vitamin D deficiency 09/25/2017  . Cervicogenic headache 09/25/2017  . Other dysphagia 09/24/2017  . Tendinitis of left rotator cuff 09/24/2017  . Tendinitis of right rotator cuff 09/24/2017  . Fibromyalgia 09/24/2017  . Elevated C-reactive protein (CRP) 09/03/2017  . Elevated sed rate 09/03/2017  . Chronic low back pain (Primary Area of Pain) (Bilateral) w/ sciatica (Left) 08/29/2017  . Chronic lower extremity pain (Secondary Area of Pain) (Left) 08/29/2017  . Chronic upper back pain [facility] Area of Pain) (Bilateral) (L>R) 08/29/2017  . Chronic neck pain (Fourth Area of Pain) (Bilateral) (L>R) 08/29/2017  . Occipital headache 08/29/2017  . Chronic knee pain (Bilateral) (R>L) 08/29/2017  . Chronic pain syndrome 08/29/2017  . Pharmacologic therapy 08/29/2017  . Long term current use of opiate analgesic 08/29/2017  . Disorder of skeletal system 08/29/2017  . Problems influencing health status 08/29/2017  . Chronic sacroiliac joint pain 08/29/2017  . Hyperlipidemia 07/31/2017  . GERD (gastroesophageal reflux disease) 05/01/2017  . Anxiety 05/01/2017  . Depression 05/01/2017  . Healthcare maintenance 05/01/2017  . Chronic idiopathic constipation 05/01/2017  . Dyspnea 10/22/2014  . Obesity (BMI 30-39.9) 10/22/2014  . Obesity 09/12/2014  . Essential hypertension 09/12/2014  . Pneumonia, lobar ([place]) 09/11/2014  . Intermittent asthma  09/11/2014  . Cirrhosis ([place]) 09/08/2014  . Chronic hepatitis C without hepatic coma ([place]) 01/15/2014  . 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 Constipation 04/24/2012  . Anemia 05/25/2011  . Atypical chest pain 05/25/2011  .  History of thyroid disease 05/25/2011  . Hepatitis C 05/25/2011  . Benign hypertension 05/25/2011  . Gastritis 05/25/2011  . Asthma 05/25/2011  . Stress incontinence 02/10/2011  . Urge incontinence 02/10/2011  . Osteoarthrosis involving multiple sites 10/17/2010     Current Meds  Medication Sig  . atenolol (TENORMIN) 25 MG tablet **PATIENT NEEDS APT FOR FURTHER REFILLS**  . busPIRone (BUSPAR) 5 MG tablet Take 1 tablet (5 mg total) by mouth 2 (two) times daily. (Patient taking differently: Take 5 mg by mouth 3 (three) times daily. )  . Calcium Carbonate-Vit D-Min (GNP CALCIUM 1200) 1200-1000 MG-UNIT CHEW Chew 1,200 mg by mouth daily with breakfast. Take in combination with vitamin D and magnesium.  Marland Kitchen dimenhyDRINATE (DRAMAMINE) 50 MG tablet Take 50 mg by mouth every 8 (eight) hours as needed.  . hydrOXYzine (VISTARIL) 50 MG capsule Take 50 mg by mouth at bedtime.  Marland Kitchen ipratropium-albuterol (DUONEB) 0.5-2.5 (3) MG/3ML SOLN INHALE 3 MLS VIA NEBULIZER INTO THE LUNGS. SEE ADMIN INSTRUCTIONS  . latanoprost (XALATAN) 0.005 % ophthalmic solution Place 1 drop into both eyes daily.  Marland Kitchen linaclotide (LINZESS) 72 MCG capsule Take 1 capsule (72 mcg total) by mouth daily before breakfast.  . lisinopril (ZESTRIL) 5 MG tablet Take 1 tablet (5 mg total) by mouth daily.  Marland Kitchen lubiprostone (AMITIZA) 24 MCG capsule Take 1 capsule (24 mcg total) by mouth 2 (two) times daily with a meal. Swallow the medication whole. Do not break or chew the medication.  . Magnesium 500 MG CAPS Take 1 capsule (500 mg total) by mouth 2 (two) times daily at 8 am and 10 pm.  . mirabegron ER (MYRBETRIQ) 25 MG TB24 tablet Take 1 tablet (25 mg total) by mouth daily. Needs to come from the Urologist- no further  RFs will be provided  . montelukast (SINGULAIR) 10 MG tablet TAKE 1 TABLET BY MOUTH EVERY DAY  . omeprazole (PRILOSEC) 40 MG capsule TAKE 1 CAPSULE BY MOUTH EVERY DAY  . ondansetron (ZOFRAN) 4 MG tablet TAKE 1 TABLET BY MOUTH EVERY 8 HOURS AS NEEDED FOR NAUSEA AND VOMITING  . paliperidone (INVEGA) 6 MG 24 hr tablet Take 6 mg by mouth every morning.  . polyethylene glycol (MIRALAX / GLYCOLAX) packet Take 17 g by mouth 2 (two) times daily.  . prazosin (MINIPRESS) 1 MG capsule Take 1 mg by mouth at bedtime.  Marland Kitchen PROAIR HFA 108 (90 Base) MCG/ACT inhaler INHALE 2 PUFFS INTO THE LUNGS 4 (FOUR) TIMES DAILY.  Marland Kitchen sertraline (ZOLOFT) 100 MG tablet Take 200 mg by mouth every morning.  . traMADol (ULTRAM) 50 MG tablet Take 2 tablets (100 mg total) by mouth every 6 (six) hours as needed for severe pain. Must last 30 days  . traZODone (DESYREL) 50 MG tablet 1 TABLET BY MOUTH AT BEDTIME  . Wheat Dextrin (BENEFIBER) POWD Take 6 g by mouth 3 (three) times daily before meals. (2 tsp = 6 g)     Allergies:  Allergies  Allergen Reactions  . Latex Hives  . Penicillins Hives  . Codeine Anxiety  . Cortisone Swelling  . Sulfa Antibiotics      ROS:  See above HPI for pertinent positives and negatives   Objective:   Blood pressure 111/84, pulse 86, height 5' 5.5" (1.664 m), weight 179 lb (81.2 kg).  (if some vitals are omitted, this means that patient was UNABLE to obtain them even though they were asked to get them prior to OV today.  They were asked to call us  at their earliest convenience with these once obtained. ) General: A & O * 3; sounds in no acute distress; in usual state of health.  Skin: Pt confirms warm and dry extremities and pink fingertips HEENT: Pt confirms lips non-cyanotic Chest: Patient confirms normal chest excursion and movement Respiratory: speaking in full sentences, no conversational dyspnea; patient confirms no use of accessory muscles Psych: insight appears good, mood- appears  full

## 2019-04-29 ENCOUNTER — Ambulatory Visit: Payer: Medicare Other | Admitting: Family Medicine

## 2019-04-30 ENCOUNTER — Other Ambulatory Visit: Payer: Self-pay | Admitting: Family Medicine

## 2019-05-15 ENCOUNTER — Telehealth: Payer: Self-pay | Admitting: *Deleted

## 2019-05-15 NOTE — Telephone Encounter (Signed)
Voicemail left for patient to call to review medications prior to VV

## 2019-05-16 NOTE — Progress Notes (Signed)
Patient: Rachael West  Service Category: E/M  Provider: Gaspar Cola, MD  DOB: [birth year]  DOS: 05/19/2019  Location: Office  MRN: [id number]  Setting: Ambulatory outpatient  Referring Provider: Esaw Grandchild, NP  Type: Established Patient  Specialty: Interventional Pain Management  PCP: No primary care provider on file.  Location: Remote location  Delivery: TeleHealth     Virtual Encounter - Pain Management PROVIDER NOTE: Information contained herein reflects review and annotations entered in association with encounter. Interpretation of such information and data should be left to medically-trained personnel. Information provided to patient can be located elsewhere in the medical record under "Patient Instructions". Document created using STT-dictation technology, any transcriptional errors that may result from process are unintentional.    Contact & Pharmacy Preferred: [phone] Home: [phone] (home) Mobile: [phone] (mobile) E-mail: [email or web address]  [id number] [place] [id number]:: [phone]:: [phone]  Pre-screening  Rachael West offered "in-person" vs "virtual" encounter. She indicated preferring virtual for this encounter.   Reason COVID-19*  Social distancing based on CDC and AMA recommendations.   I contacted Rachael Fergusonon 05/19/2019 via telephone.      I clearly identified myself as FGaspar Cola MD. I verified that I was speaking with the correct person using two identifiers (Name: Rachael West and date of birth: 829-Aug-1957.  Consent I sought verbal advanced consent from Rachael West. I informed Rachael West of possible security and privacy concerns, risks, and limitations associated with providing "not-in-person" medical evaluation and management services. I also informed Ms.  West of the availability of "in-person" appointments. Finally, I informed her that there would be a charge for the virtual visit and that she could be  personally, fully or partially, financially responsible for it. Rachael West understanding and agreed to proceed.   Historic Elements   Ms. LADELE MILSONis a 64y.o. year old, female patient evaluated today after her last contact with our practice on 05/15/2019. Rachael West has a past medical history of Anemia, Anxiety, Arthritis, Asthma, Depression, GERD (gastroesophageal reflux disease), Hepatitis-C, History of chemotherapy, Hypertension, Insomnia, [place]), Personal history of non-Hodgkin lymphomas, and Vitamin D deficiency. She also  has a past surgical history that includes Appendectomy; Trigger finger release; Esophagogastroduodenoscopy (01/17/2011); Colonoscopy (01/17/2011); Abdominal hysterectomy; and Breast biopsy (Left, 2015). Rachael West a current medication list which includes the following prescription(s): atenolol, buspirone, gnp calcium 1200, dimenhydrinate, fluticasone-salmeterol, hydroxyzine, ipratropium-albuterol, latanoprost, linaclotide, lisinopril, lubiprostone, magnesium, mirabegron er, montelukast, omeprazole, ondansetron, paliperidone, polyethylene glycol, prazosin, proair hfa, sertraline, trazodone, benefiber, [START ON 07/26/2019] lubiprostone, and tramadol. She  reports that she quit smoking about 29 years ago. Her smoking use included cigarettes. She has a 30.00 pack-year smoking history. She has never used smokeless tobacco. She reports that she does not drink alcohol or use drugs. [name] allergic to latex; penicillins; codeine; cortisone; and sulfa antibiotics.   HPI  Today, she is being contacted for medication management. The patient indicates doing well with the current medication regimen. No adverse reactions or side effects reported to the medications.    Pharmacotherapy Assessment  Analgesic: Tramadol50 mg,2tabs (100 mg) PO q 6 hrs (400 mg/day of tramadol) MME/day:469mday.   Monitoring: [place] PMP: PDMP reviewed during this encounter.       Pharmacotherapy: No side-effects or adverse reactions reported. Compliance: No problems identified. Effectiveness: Clinically acceptable. Plan: Refer to "POC".  UDS:  Summary  Date Value Ref Range Status  02/06/2018 FINAL  Final    Comment:    ==================================================================== TOXASSURE SELECT 13 (MW) ==================================================================== Test                             Result       Flag       Units Drug Present and Declared for Prescription Verification   Tramadol                       >3759        EXPECTED   ng/mg creat   O-Desmethyltramadol            >3759        EXPECTED   ng/mg creat   N-Desmethyltramadol            >3759        EXPECTED   ng/mg creat    Source of tramadol is a prescription medication.    O-desmethyltramadol and N-desmethyltramadol are expected    metabolites of tramadol. ==================================================================== Test                      Result    Flag   Units      Ref Range   Creatinine              133              mg/dL      >=20 ==================================================================== Declared Medications:  The flagging and interpretation on this report are based on the  following declared medications.  Unexpected results may arise from  inaccuracies in the declared medications.  **Note: The testing scope of this panel includes these medications:  Tramadol (Ultram)  **Note: The testing scope of this panel does not include following  reported medications:  Albuterol (Duoneb)  Albuterol (Proventil)  Atenolol  Calcium carbonate (Calcium Carb/Vitamin D)  Cyclobenzaprine (Flexeril)  Hydroxyzine (Vistaril)  Ipratropium (Duoneb)  Latanoprost (Xalatan)   Linaclotide (Linzess)  Lisinopril  Magnesium  Meloxicam (Mobic)  Montelukast (Singulair)  Omeprazole (Prilosec)  Ondansetron (Zofran)  Paliperidone (Invega)  Polyethylene Glycol (MiraLAX)  Prazosin (Minipress)  Sertraline (Zoloft)  Solifenacin (Vesicare)  Trazodone (Desyrel)  Vitamin D (Calcium Carb/Vitamin D) ==================================================================== For clinical consultation, please call [phone]. ====================================================================    Laboratory Chemistry Profile   Renal Lab Results  Component Value Date   BUN 14 08/05/2018   CREATININE 0.98 08/05/2018   BCR 9 (L) 05/29/2018   GFRAA >60 08/05/2018   GFRNONAA >60 08/05/2018     Hepatic Lab Results  Component Value Date   AST 21 08/05/2018   ALT 15 08/05/2018   ALBUMIN 3.5 08/05/2018   ALKPHOS 72 08/05/2018   HCVAB REACTIVE (A) 01/12/2014   LIPASE 34 10/31/2010     Electrolytes Lab Results  Component Value Date   NA 139 08/05/2018   K 4.1 08/05/2018   CL 104 08/05/2018   CALCIUM 9.4 08/05/2018   MG 2.2 08/29/2017     Bone Lab Results  Component Value Date   25OHVITD1 21 (L) 08/29/2017   25OHVITD2 <1.0 08/29/2017   25OHVITD3 21 08/29/2017     Inflammation (CRP: Acute Phase) (ESR: Chronic Phase) Lab Results  Component Value Date   CRP 12 (H) 08/29/2017   ESRSEDRATE 46 (H) 08/29/2017       Note: Above Lab results reviewed.  Imaging  MM 3D  SCREEN BREAST BILATERAL CLINICAL DATA:  Screening.  EXAM: DIGITAL SCREENING BILATERAL MAMMOGRAM WITH TOMO AND CAD  COMPARISON:  Previous exam(s).  ACR Breast Density Category b: There are scattered areas of fibroglandular density.  FINDINGS: There are no findings suspicious for malignancy. Images were processed with CAD.  IMPRESSION: No mammographic evidence of malignancy. A result letter of this screening mammogram will be mailed directly to the patient.  RECOMMENDATION: Screening  mammogram in one year. (Code:[id number]-B-01Y)  BI-RADS CATEGORY  1: Negative.  Electronically Signed   By: Audie Pinto M.D.   On: 01/08/2019 15:55  Assessment  The primary encounter diagnosis was Chronic pain syndrome. Diagnoses of Chronic neck pain (Fourth Area of Pain) (Bilateral) (L>R), Cervical facet syndrome (Bilateral) (R>L), Chronic low back pain (Primary Area of Pain) (Bilateral) (L>R) w/o sciatica, Lumbar facet syndrome (Bilateral) (L>R), Chronic lower extremity pain (Secondary Area of Pain) (Left), Chronic upper back pain (Tertiary Area of Pain) (Bilateral) (L>R), Pharmacologic therapy, Therapeutic opioid-induced constipation (OIC), and Lumbar facet arthropathy were also pertinent to this visit.  Plan of Care  Problem-specific:  No problem-specific Assessment & Plan notes found for this encounter.  Ms. RENAYE JANICKI has a current medication list which includes the following long-term medication(s): atenolol, gnp calcium 1200, fluticasone-salmeterol, ipratropium-albuterol, linaclotide, lisinopril, lubiprostone, magnesium, mirabegron er, montelukast, omeprazole, paliperidone, prazosin, proair hfa, sertraline, trazodone, benefiber, [START ON 07/26/2019] lubiprostone, and tramadol.  Pharmacotherapy (Medications Ordered): Meds ordered this encounter  Medications  . lubiprostone (AMITIZA) 24 MCG capsule    Sig: Take 1 capsule (24 mcg total) by mouth 2 (two) times daily with a meal. Swallow the medication whole. Do not break or chew the medication.    Dispense:  60 capsule    Refill:  5    Fill one day early if pharmacy is closed on scheduled refill date. May substitute for generic if available. Instruct patient to go down to QD if loose stools becomes a problem.   Orders:  Orders Placed This Encounter  Procedures  . LUMBAR FACET(MEDIAL BRANCH NERVE BLOCK) MBNB    Standing Status:   Future    Standing Expiration Date:   06/19/2019    Scheduling Instructions:     Procedure:  Lumbar facet block (AKA.: Lumbosacral medial branch nerve block)     Side: Bilateral     Level: L3-4, L4-5, & L5-S1 Facets (L2, L3, L4, L5, & S1 Medial Branch Nerves)     Sedation: Patient's choice.     Timeframe: ASAA    Order Specific Question:   Where will this procedure be performed?    Answer:   [facility]  . ToxASSURE Select 13 (MW), Urine    Volume: 30 ml(s). Minimum 3 ml of urine is needed. Document temperature of fresh sample. Indications: Long term (current) use of opiate analgesic (I19.471)    Order Specific Question:   Release to patient    Answer:   Immediate   Follow-up plan:   Return in about 2 months (around 07/30/2019) for (F2F), (MM), in addition, Procedure (w/ sedation): (B) L-FCT Blk #3.      Interventional management options:  Considering:   NOTE: LATEX ALLERGY & Hx Hepatitis C (w/ cirrhosis) Diagnostic left LESI Possible bilateral lumbar facetRFA Diagnostic bilateral thoracic facet block Possiblebilateral thoracic facetRFA Possible bilateral cervical facetRFA Diagnostic bilateral GONB Diagnostic trigger point injections   Palliative PRN treatment(s):   Palliative/diagnosticbilateral lumbar facet block#3 Palliative/diagnostic bilateralcervicalfacet block#2     Recent Visits No visits were found meeting these conditions.  Showing recent visits within past 90 days and meeting all other requirements   Today's Visits Date Type Provider Dept  05/19/19 Office Visit Milinda Pointer, MD [facility]  Showing today's visits and meeting all other requirements   Future Appointments No visits were found meeting these conditions.  Showing future appointments within next 90 days and meeting all other requirements   I discussed the assessment and treatment plan with the patient. The patient was provided an opportunity to ask questions and all were answered. The patient agreed with the plan and demonstrated an understanding of the  instructions.  Patient advised to call back or seek an in-person evaluation if the symptoms or condition worsens.  Duration of encounter: 15 minutes.  Note by: Gaspar Cola, MD Date: 05/19/2019; Time: 3:30 PM

## 2019-05-19 ENCOUNTER — Ambulatory Visit: Payer: Medicare Other | Attending: Pain Medicine | Admitting: Pain Medicine

## 2019-05-19 ENCOUNTER — Other Ambulatory Visit: Payer: Self-pay

## 2019-05-19 ENCOUNTER — Encounter: Payer: Self-pay | Admitting: Pain Medicine

## 2019-05-19 DIAGNOSIS — G894 Chronic pain syndrome: Secondary | ICD-10-CM

## 2019-05-19 DIAGNOSIS — M47812 Spondylosis without myelopathy or radiculopathy, cervical region: Secondary | ICD-10-CM | POA: Diagnosis not present

## 2019-05-19 DIAGNOSIS — M549 Dorsalgia, unspecified: Secondary | ICD-10-CM

## 2019-05-19 DIAGNOSIS — M542 Cervicalgia: Secondary | ICD-10-CM | POA: Diagnosis not present

## 2019-05-19 DIAGNOSIS — K5903 Drug induced constipation: Secondary | ICD-10-CM

## 2019-05-19 DIAGNOSIS — Z79899 Other long term (current) drug therapy: Secondary | ICD-10-CM

## 2019-05-19 DIAGNOSIS — M47816 Spondylosis without myelopathy or radiculopathy, lumbar region: Secondary | ICD-10-CM

## 2019-05-19 DIAGNOSIS — M545 Low back pain: Secondary | ICD-10-CM

## 2019-05-19 DIAGNOSIS — G8929 Other chronic pain: Secondary | ICD-10-CM

## 2019-05-19 DIAGNOSIS — T402X5A Adverse effect of other opioids, initial encounter: Secondary | ICD-10-CM

## 2019-05-19 DIAGNOSIS — M79605 Pain in left leg: Secondary | ICD-10-CM

## 2019-05-19 MED ORDER — LUBIPROSTONE 24 MCG PO CAPS: 24.0000 ug | ORAL_CAPSULE | Freq: Two times a day (BID) | ORAL | 5 refills | Status: DC

## 2019-05-19 NOTE — Patient Instructions (Signed)
____________________________________________________________________________________________  Preparing for Procedure with Sedation  Procedure appointments are limited to planned procedures: . No Prescription Refills. . No disability issues will be discussed. . No medication changes will be discussed.  Instructions: . Oral Intake: Do not eat or drink anything for at least 3 hours prior to your procedure. (Exception: Blood Pressure Medication. See below.) . Transportation: Unless otherwise stated by your physician, you may drive yourself after the procedure. . Blood Pressure Medicine: Do not forget to take your blood pressure medicine with a sip of water the morning of the procedure. If your Diastolic (lower reading)is above 100 mmHg, elective cases will be cancelled/rescheduled. . Blood thinners: These will need to be stopped for procedures. Notify our staff if you are taking any blood thinners. Depending on which one you take, there will be specific instructions on how and when to stop it. . Diabetics on insulin: Notify the staff so that you can be scheduled 1st case in the morning. If your diabetes requires high dose insulin, take only  of your normal insulin dose the morning of the procedure and notify the staff that you have done so. . Preventing infections: Shower with an antibacterial soap the morning of your procedure. . Build-up your immune system: Take 1000 mg of Vitamin C with every meal (3 times a day) the day prior to your procedure. . Antibiotics: Inform the staff if you have a condition or reason that requires you to take antibiotics before dental procedures. . Pregnancy: If you are pregnant, call and cancel the procedure. . Sickness: If you have a cold, fever, or any active infections, call and cancel the procedure. . Arrival: You must be in the facility at least 30 minutes prior to your scheduled procedure. . Children: Do not bring children with you. . Dress appropriately:  Bring dark clothing that you would not mind if they get stained. . Valuables: Do not bring any jewelry or valuables.  Reasons to call and reschedule or cancel your procedure: (Following these recommendations will minimize the risk of a serious complication.) . Surgeries: Avoid having procedures within 2 weeks of any surgery. (Avoid for 2 weeks before or after any surgery). . Flu Shots: Avoid having procedures within 2 weeks of a flu shots or . (Avoid for 2 weeks before or after immunizations). . Barium: Avoid having a procedure within 7-10 days after having had a radiological study involving the use of radiological contrast. (Myelograms, Barium swallow or enema study). . Heart attacks: Avoid any elective procedures or surgeries for the initial 6 months after a "Myocardial Infarction" (Heart Attack). . Blood thinners: It is imperative that you stop these medications before procedures. Let us know if you if you take any blood thinner.  . Infection: Avoid procedures during or within two weeks of an infection (including chest colds or gastrointestinal problems). Symptoms associated with infections include: Localized redness, fever, chills, night sweats or profuse sweating, burning sensation when voiding, cough, congestion, stuffiness, runny nose, sore throat, diarrhea, nausea, vomiting, cold or Flu symptoms, recent or current infections. It is specially important if the infection is over the area that we intend to treat. . Heart and lung problems: Symptoms that may suggest an active cardiopulmonary problem include: cough, chest pain, breathing difficulties or shortness of breath, dizziness, ankle swelling, uncontrolled high or unusually low blood pressure, and/or palpitations. If you are experiencing any of these symptoms, cancel your procedure and contact your primary care physician for an evaluation.  Remember:  Regular Business hours are:    Monday to Thursday 8:00 AM to 4:00 PM  Provider's  Schedule: Catha Ontko, MD:  Procedure days: Tuesday and Thursday 7:30 AM to 4:00 PM  Bilal Lateef, MD:  Procedure days: Monday and Wednesday 7:30 AM to 4:00 PM ____________________________________________________________________________________________   ____________________________________________________________________________________________  General Risks and Possible Complications  Patient Responsibilities: It is important that you read this as it is part of your informed consent. It is our duty to inform you of the risks and possible complications associated with treatments offered to you. It is your responsibility as a patient to read this and to ask questions about anything that is not clear or that you believe was not covered in this document.  Patient's Rights: You have the right to refuse treatment. You also have the right to change your mind, even after initially having agreed to have the treatment done. However, under this last option, if you wait until the last second to change your mind, you may be charged for the materials used up to that point.  Introduction: Medicine is not an exact science. Everything in Medicine, including the lack of treatment(s), carries the potential for danger, harm, or loss (which is by definition: Risk). In Medicine, a complication is a secondary problem, condition, or disease that can aggravate an already existing one. All treatments carry the risk of possible complications. The fact that a side effects or complications occurs, does not imply that the treatment was conducted incorrectly. It must be clearly understood that these can happen even when everything is done following the highest safety standards.  No treatment: You can choose not to proceed with the proposed treatment alternative. The "PRO(s)" would include: avoiding the risk of complications associated with the therapy. The "CON(s)" would include: not getting any of the treatment  benefits. These benefits fall under one of three categories: diagnostic; therapeutic; and/or palliative. Diagnostic benefits include: getting information which can ultimately lead to improvement of the disease or symptom(s). Therapeutic benefits are those associated with the successful treatment of the disease. Finally, palliative benefits are those related to the decrease of the primary symptoms, without necessarily curing the condition (example: decreasing the pain from a flare-up of a chronic condition, such as incurable terminal cancer).  General Risks and Complications: These are associated to most interventional treatments. They can occur alone, or in combination. They fall under one of the following six (6) categories: no benefit or worsening of symptoms; bleeding; infection; nerve damage; allergic reactions; and/or death. 1. No benefits or worsening of symptoms: In Medicine there are no guarantees, only probabilities. No healthcare provider can ever guarantee that a medical treatment will work, they can only state the probability that it may. Furthermore, there is always the possibility that the condition may worsen, either directly, or indirectly, as a consequence of the treatment. 2. Bleeding: This is more common if the patient is taking a blood thinner, either prescription or over the counter (example: Goody Powders, Fish oil, Aspirin, Garlic, etc.), or if suffering a condition associated with impaired coagulation (example: Hemophilia, cirrhosis of the liver, low platelet counts, etc.). However, even if you do not have one on these, it can still happen. If you have any of these conditions, or take one of these drugs, make sure to notify your treating physician. 3. Infection: This is more common in patients with a compromised immune system, either due to disease (example: diabetes, cancer, human immunodeficiency virus [HIV], etc.), or due to medications or treatments (example: therapies used to treat  cancer and   rheumatological diseases). However, even if you do not have one on these, it can still happen. If you have any of these conditions, or take one of these drugs, make sure to notify your treating physician. 4. Nerve Damage: This is more common when the treatment is an invasive one, but it can also happen with the use of medications, such as those used in the treatment of cancer. The damage can occur to small secondary nerves, or to large primary ones, such as those in the spinal cord and brain. This damage may be temporary or permanent and it may lead to impairments that can range from temporary numbness to permanent paralysis and/or brain death. 5. Allergic Reactions: Any time a substance or material comes in contact with our body, there is the possibility of an allergic reaction. These can range from a mild skin rash (contact dermatitis) to a severe systemic reaction (anaphylactic reaction), which can result in death. 6. Death: In general, any medical intervention can result in death, most of the time due to an unforeseen complication. ____________________________________________________________________________________________   

## 2019-05-22 DIAGNOSIS — G894 Chronic pain syndrome: Secondary | ICD-10-CM | POA: Diagnosis not present

## 2019-05-22 DIAGNOSIS — R6889 Other general symptoms and signs: Secondary | ICD-10-CM | POA: Diagnosis not present

## 2019-05-22 DIAGNOSIS — Z79899 Other long term (current) drug therapy: Secondary | ICD-10-CM | POA: Diagnosis not present

## 2019-05-27 LAB — TOXASSURE SELECT 13 (MW), URINE

## 2019-06-03 ENCOUNTER — Encounter: Payer: Self-pay | Admitting: Pain Medicine

## 2019-06-03 ENCOUNTER — Ambulatory Visit
Admission: RE | Admit: 2019-06-03 | Discharge: 2019-06-03 | Disposition: A | Discharge status: Home / Self Care | Payer: Medicare Other | Source: Ambulatory Visit | Attending: Pain Medicine | Admitting: Pain Medicine

## 2019-06-03 ENCOUNTER — Ambulatory Visit (HOSPITAL_BASED_OUTPATIENT_CLINIC_OR_DEPARTMENT_OTHER): Payer: Medicare Other | Admitting: Pain Medicine

## 2019-06-03 ENCOUNTER — Other Ambulatory Visit: Payer: Self-pay

## 2019-06-03 VITALS — BP 125/84 | HR 90 | Temp 97.3°F | Resp 19 | Ht 65.0 in | Wt 189.0 lb

## 2019-06-03 DIAGNOSIS — M47816 Spondylosis without myelopathy or radiculopathy, lumbar region: Secondary | ICD-10-CM | POA: Diagnosis not present

## 2019-06-03 DIAGNOSIS — M545 Low back pain, unspecified: Secondary | ICD-10-CM

## 2019-06-03 DIAGNOSIS — Z91040 Latex allergy status: Secondary | ICD-10-CM | POA: Diagnosis not present

## 2019-06-03 DIAGNOSIS — M5136 Other intervertebral disc degeneration, lumbar region: Secondary | ICD-10-CM

## 2019-06-03 DIAGNOSIS — G8929 Other chronic pain: Secondary | ICD-10-CM | POA: Insufficient documentation

## 2019-06-03 MED ORDER — TRIAMCINOLONE ACETONIDE 40 MG/ML IJ SUSP
INTRAMUSCULAR | Status: AC
  Filled 2019-06-03: qty 2

## 2019-06-03 MED ORDER — MIDAZOLAM HCL 5 MG/5ML IJ SOLN
INTRAMUSCULAR | Status: AC
  Filled 2019-06-03: qty 5

## 2019-06-03 MED ORDER — ROPIVACAINE HCL 2 MG/ML IJ SOLN
INTRAMUSCULAR | Status: AC
  Filled 2019-06-03: qty 20

## 2019-06-03 MED ORDER — FENTANYL CITRATE (PF) 100 MCG/2ML IJ SOLN
INTRAMUSCULAR | Status: AC
  Filled 2019-06-03: qty 2

## 2019-06-03 MED ORDER — LACTATED RINGERS IV SOLN
1000.0000 mL | Freq: Once | INTRAVENOUS | Status: AC
  Administered 2019-06-03: 1000 mL via INTRAVENOUS

## 2019-06-03 MED ORDER — MIDAZOLAM HCL 5 MG/5ML IJ SOLN
1.0000 mg | INTRAMUSCULAR | Status: DC | PRN
  Administered 2019-06-03: 1 mg via INTRAVENOUS

## 2019-06-03 MED ORDER — LIDOCAINE HCL 2 % IJ SOLN
INTRAMUSCULAR | Status: AC
  Filled 2019-06-03: qty 20

## 2019-06-03 MED ORDER — FENTANYL CITRATE (PF) 100 MCG/2ML IJ SOLN
25.0000 ug | INTRAMUSCULAR | Status: DC | PRN
  Administered 2019-06-03: 50 ug via INTRAVENOUS

## 2019-06-03 MED ORDER — ROPIVACAINE HCL 2 MG/ML IJ SOLN
18.0000 mL | Freq: Once | INTRAMUSCULAR | Status: AC
  Administered 2019-06-03: 18 mL via PERINEURAL

## 2019-06-03 MED ORDER — TRIAMCINOLONE ACETONIDE 40 MG/ML IJ SUSP
80.0000 mg | Freq: Once | INTRAMUSCULAR | Status: AC
  Administered 2019-06-03: 80 mg

## 2019-06-03 MED ORDER — LIDOCAINE HCL 2 % IJ SOLN
20.0000 mL | Freq: Once | INTRAMUSCULAR | Status: AC
  Administered 2019-06-03: 400 mg

## 2019-06-03 NOTE — Progress Notes (Signed)
PROVIDER NOTE: Information contained herein reflects review and annotations entered in association with encounter. Interpretation of such information and data should be left to medically-trained personnel. Information provided to patient can be located elsewhere in the medical record under "Patient Instructions". Document created using STT-dictation technology, any transcriptional errors that may result from process are unintentional.    Patient: Rachael West  Service Category: Procedure  Provider: Gaspar Cola, MD  DOB: 12/15/1955  DOS: 06/03/2019  Location: [facility]  MRN: [id number]  Setting: Ambulatory - outpatient  Referring Provider: No ref. provider found  Type: Established Patient  Specialty: Interventional Pain Management  PCP: Mellody Dance, DO   Primary Reason for Visit: Interventional Pain Management Treatment. CC: Back Pain (low)  Procedure:          Anesthesia, Analgesia, Anxiolysis:  Type: Lumbar Facet, Medial Branch Block(s) #3  Primary Purpose: Therapeutic Region: Posterolateral Lumbosacral Spine Level: L2, L3, L4, L5, & S1 Medial Branch Level(s). Injecting these levels blocks the L3-4, L4-5, and L5-S1 lumbar facet joints. Laterality: Bilateral  Type: Moderate (Conscious) Sedation combined with Local Anesthesia Indication(s): Analgesia and Anxiety Route: Intravenous (IV) IV Access: Secured Sedation: Meaningful verbal contact was maintained at all times during the procedure  Local Anesthetic: Lidocaine 1-2%  Position: Prone   Indications: 1. Lumbar facet syndrome (Bilateral) (L>R)   2. Spondylosis without myelopathy or radiculopathy, lumbar region   3. Lumbar facet arthropathy   4. DDD (degenerative disc disease), lumbar   5. Chronic low back pain (Primary Area of Pain) (Bilateral) (L>R) w/o sciatica   6. Latex precautions, history of latex allergy    Pain Score: Pre-procedure: 5 /10 Post-procedure: 0-No pain/10   Pre-op  Assessment:  Rachael West is a 64 y.o. (year old), female patient, seen today for interventional treatment. She  has a past surgical history that includes Appendectomy; Trigger finger release; Esophagogastroduodenoscopy (01/17/2011); Colonoscopy (01/17/2011); Abdominal hysterectomy; and Breast biopsy (Left, 2015). Rachael West has a current medication list which includes the following prescription(s): atenolol, buspirone, gnp calcium 1200, dimenhydrinate, fluticasone-salmeterol, hydroxyzine, ipratropium-albuterol, latanoprost, lisinopril, lubiprostone, magnesium, mirabegron er, montelukast, omeprazole, ondansetron, paliperidone, polyethylene glycol, prazosin, proair hfa, sertraline, tramadol, trazodone, benefiber, linaclotide, and [START ON 07/26/2019] lubiprostone, and the following Facility-Administered Medications: fentanyl and midazolam. Her primarily concern today is the Back Pain (low)  Initial Vital Signs:  Pulse/HCG Rate: 90ECG Heart Rate: 85 Temp: 98.3 F (36.8 C) Resp: 18 BP: (!) 135/93 SpO2: 99 %  BMI: Estimated body mass index is 31.45 kg/m as calculated from the following:   Height as of this encounter: 5\' 5"  (1.651 m).   Weight as of this encounter: 189 lb (85.7 kg).  Risk Assessment: Allergies: Reviewed. She is allergic to latex; penicillins; codeine; cortisone; and sulfa antibiotics.  Allergy Precautions: None required Coagulopathies: Reviewed. None identified.  Blood-thinner therapy: None at this time Active Infection(s): Reviewed. None identified. Rachael West is afebrile  Site Confirmation: Rachael West was asked to confirm the procedure and laterality before marking the site Procedure checklist: Completed Consent: Before the procedure and under the influence of no sedative(s), amnesic(s), or anxiolytics, the patient was informed of the treatment options, risks and possible complications. To fulfill our ethical and legal obligations, as recommended by the  American Medical Association's Code of Ethics, I have informed the patient of my clinical impression; the nature and purpose of the treatment or procedure; the risks, benefits, and possible complications of the intervention; the alternatives, including doing nothing; the risk(s) and benefit(s) of the  alternative treatment(s) or procedure(s); and the risk(s) and benefit(s) of doing nothing. The patient was provided information about the general risks and possible complications associated with the procedure. These may include, but are not limited to: failure to achieve desired goals, infection, bleeding, organ or nerve damage, allergic reactions, paralysis, and death. In addition, the patient was informed of those risks and complications associated to Spine-related procedures, such as failure to decrease pain; infection (i.e.: Meningitis, epidural or intraspinal abscess); bleeding (i.e.: epidural hematoma, subarachnoid hemorrhage, or any other type of intraspinal or peri-dural bleeding); organ or nerve damage (i.e.: Any type of peripheral nerve, nerve root, or spinal cord injury) with subsequent damage to sensory, motor, and/or autonomic systems, resulting in permanent pain, numbness, and/or weakness of one or several areas of the body; allergic reactions; (i.e.: anaphylactic reaction); and/or death. Furthermore, the patient was informed of those risks and complications associated with the medications. These include, but are not limited to: allergic reactions (i.e.: anaphylactic or anaphylactoid reaction(s)); adrenal axis suppression; blood sugar elevation that in diabetics may result in ketoacidosis or comma; water retention that in patients with history of congestive heart failure may result in shortness of breath, pulmonary edema, and decompensation with resultant heart failure; weight gain; swelling or edema; medication-induced neural toxicity; particulate matter embolism and blood vessel occlusion with  resultant organ, and/or nervous system infarction; and/or aseptic necrosis of one or more joints. Finally, the patient was informed that Medicine is not an exact science; therefore, there is also the possibility of unforeseen or unpredictable risks and/or possible complications that may result in a catastrophic outcome. The patient indicated having understood very clearly. We have given the patient no guarantees and we have made no promises. Enough time was given to the patient to ask questions, all of which were answered to the patient's satisfaction. Ms. Cambridge has indicated that she wanted to continue with the procedure. Attestation: I, the ordering provider, attest that I have discussed with the patient the benefits, risks, side-effects, alternatives, likelihood of achieving goals, and potential problems during recovery for the procedure that I have provided informed consent. Date  Time: 06/03/2019  8:26 AM  Pre-Procedure Preparation:  Monitoring: As per clinic protocol. Respiration, ETCO2, SpO2, BP, heart rate and rhythm monitor placed and checked for adequate function Safety Precautions: Patient was assessed for positional comfort and pressure points before starting the procedure. Time-out: I initiated and conducted the "Time-out" before starting the procedure, as per protocol. The patient was asked to participate by confirming the accuracy of the "Time Out" information. Verification of the correct person, site, and procedure were performed and confirmed by me, the nursing staff, and the patient. "Time-out" conducted as per Joint Commission's Universal Protocol (UP.01.01.01). Time: 0918  Description of Procedure:          Laterality: Bilateral. The procedure was performed in identical fashion on both sides. Levels:  L2, L3, L4, L5, & S1 Medial Branch Level(s) Area Prepped: Posterior Lumbosacral Region DuraPrep (Iodine Povacrylex [0.7% available iodine] and Isopropyl Alcohol, 74%  w/w) Safety Precautions: Aspiration looking for blood return was conducted prior to all injections. At no point did we inject any substances, as a needle was being advanced. Before injecting, the patient was told to immediately notify me if she was experiencing any new onset of "ringing in the ears, or metallic taste in the mouth". No attempts were made at seeking any paresthesias. Safe injection practices and needle disposal techniques used. Medications properly checked for expiration dates. SDV (single dose  vial) medications used. After the completion of the procedure, all disposable equipment used was discarded in the proper designated medical waste containers. Local Anesthesia: Protocol guidelines were followed. The patient was positioned over the fluoroscopy table. The area was prepped in the usual manner. The time-out was completed. The target area was identified using fluoroscopy. A 12-in long, straight, sterile hemostat was used with fluoroscopic guidance to locate the targets for each level blocked. Once located, the skin was marked with an approved surgical skin marker. Once all sites were marked, the skin (epidermis, dermis, and hypodermis), as well as deeper tissues (fat, connective tissue and muscle) were infiltrated with a small amount of a short-acting local anesthetic, loaded on a 10cc syringe with a 25G, 1.5-in  Needle. An appropriate amount of time was allowed for local anesthetics to take effect before proceeding to the next step. Local Anesthetic: Lidocaine 2.0% The unused portion of the local anesthetic was discarded in the proper designated containers. Technical explanation of process:  L2 Medial Branch Nerve Block (MBB): The target area for the L2 medial branch is at the junction of the postero-lateral aspect of the superior articular process and the superior, posterior, and medial edge of the transverse process of L3. Under fluoroscopic guidance, a Quincke needle was inserted until  contact was made with os over the superior postero-lateral aspect of the pedicular shadow (target area). After negative aspiration for blood, 0.5 mL of the nerve block solution was injected without difficulty or complication. The needle was removed intact. L3 Medial Branch Nerve Block (MBB): The target area for the L3 medial branch is at the junction of the postero-lateral aspect of the superior articular process and the superior, posterior, and medial edge of the transverse process of L4. Under fluoroscopic guidance, a Quincke needle was inserted until contact was made with os over the superior postero-lateral aspect of the pedicular shadow (target area). After negative aspiration for blood, 0.5 mL of the nerve block solution was injected without difficulty or complication. The needle was removed intact. L4 Medial Branch Nerve Block (MBB): The target area for the L4 medial branch is at the junction of the postero-lateral aspect of the superior articular process and the superior, posterior, and medial edge of the transverse process of L5. Under fluoroscopic guidance, a Quincke needle was inserted until contact was made with os over the superior postero-lateral aspect of the pedicular shadow (target area). After negative aspiration for blood, 0.5 mL of the nerve block solution was injected without difficulty or complication. The needle was removed intact. L5 Medial Branch Nerve Block (MBB): The target area for the L5 medial branch is at the junction of the postero-lateral aspect of the superior articular process and the superior, posterior, and medial edge of the sacral ala. Under fluoroscopic guidance, a Quincke needle was inserted until contact was made with os over the superior postero-lateral aspect of the pedicular shadow (target area). After negative aspiration for blood, 0.5 mL of the nerve block solution was injected without difficulty or complication. The needle was removed intact. S1 Medial Branch Nerve  Block (MBB): The target area for the S1 medial branch is at the posterior and inferior 6 o'clock position of the L5-S1 facet joint. Under fluoroscopic guidance, the Quincke needle inserted for the L5 MBB was redirected until contact was made with os over the inferior and postero aspect of the sacrum, at the 6 o' clock position under the L5-S1 facet joint (Target area). After negative aspiration for blood, 0.5 mL of  the nerve block solution was injected without difficulty or complication. The needle was removed intact.  Nerve block solution: 0.2% PF-Ropivacaine + Triamcinolone (40 mg/mL) diluted to a final concentration of 4 mg of Triamcinolone/mL of Ropivacaine The unused portion of the solution was discarded in the proper designated containers. Procedural Needles: 22-gauge, 3.5-inch, Quincke needles used for all levels.  Once the entire procedure was completed, the treated area was cleaned, making sure to leave some of the prepping solution back to take advantage of its long term bactericidal properties.   Illustration of the posterior view of the lumbar spine and the posterior neural structures. Laminae of L2 through S1 are labeled. DPRL5, dorsal primary ramus of L5; DPRS1, dorsal primary ramus of S1; DPR3, dorsal primary ramus of L3; FJ, facet (zygapophyseal) joint L3-L4; I, inferior articular process of L4; LB1, lateral branch of dorsal primary ramus of L1; IAB, inferior articular branches from L3 medial branch (supplies L4-L5 facet joint); IBP, intermediate branch plexus; MB3, medial branch of dorsal primary ramus of L3; NR3, third lumbar nerve root; S, superior articular process of L5; SAB, superior articular branches from L4 (supplies L4-5 facet joint also); TP3, transverse process of L3.  Vitals:   06/03/19 0933 06/03/19 0938 06/03/19 0948 06/03/19 0958  BP: 134/90 118/73 120/88 125/84  Pulse:      Resp: 16 19 19 19   Temp:  97.7 F (36.5 C)  (!) 97.3 F (36.3 C)  SpO2: 100% 96% 99% 98%   Weight:      Height:         Start Time: 0918 hrs. End Time: 0930 hrs.  Imaging Guidance (Spinal):          Type of Imaging Technique: Fluoroscopy Guidance (Spinal) Indication(s): Assistance in needle guidance and placement for procedures requiring needle placement in or near specific anatomical locations not easily accessible without such assistance. Exposure Time: Please see nurses notes. Contrast: None used. Fluoroscopic Guidance: I was personally present during the use of fluoroscopy. "Tunnel Vision Technique" used to obtain the best possible view of the target area. Parallax error corrected before commencing the procedure. "Direction-depth-direction" technique used to introduce the needle under continuous pulsed fluoroscopy. Once target was reached, antero-posterior, oblique, and lateral fluoroscopic projection used confirm needle placement in all planes. Images permanently stored in EMR. Interpretation: No contrast injected. I personally interpreted the imaging intraoperatively. Adequate needle placement confirmed in multiple planes. Permanent images saved into the patient's record.  Antibiotic Prophylaxis:   Anti-infectives (From admission, onward)   None     Indication(s): None identified  Post-operative Assessment:  Post-procedure Vital Signs:  Pulse/HCG Rate: 9083 Temp: (!) 97.3 F (36.3 C) Resp: 19 BP: 125/84 SpO2: 98 %  EBL: None  Complications: No immediate post-treatment complications observed by team, or reported by patient.  Note: The patient tolerated the entire procedure well. A repeat set of vitals were taken after the procedure and the patient was kept under observation following institutional policy, for this type of procedure. Post-procedural neurological assessment was performed, showing return to baseline, prior to discharge. The patient was provided with post-procedure discharge instructions, including a section on how to identify potential problems.  Should any problems arise concerning this procedure, the patient was given instructions to immediately contact us, at any time, without hesitation. In any case, we plan to contact the patient by telephone for a follow-up status report regarding this interventional procedure.  Comments:  No additional relevant information.  Plan of Care  Orders:  Orders Placed  This Encounter  Procedures  . LUMBAR FACET(MEDIAL BRANCH NERVE BLOCK) MBNB    Scheduling Instructions:     Procedure: Lumbar facet block (AKA.: Lumbosacral medial branch nerve block)     Side: Bilateral     Level: L3-4, L4-5, & L5-S1 Facets (L2, L3, L4, L5, & S1 Medial Branch Nerves)     Sedation: Patient's choice.     Timeframe: Today    Order Specific Question:   Where will this procedure be performed?    Answer:   [facility]  . DG PA[facility] C-ARM 1-60 MIN NO REPORT    Intraoperative interpretation by procedural physician at [place].    Standing Status:   Standing    Number of Occurrences:   1    Order Specific Question:   Reason for exam:    Answer:   Assistance in needle guidance and placement for procedures requiring needle placement in or near specific anatomical locations not easily accessible without such assistance.  . Informed Consent Details: Physician/Practitioner Attestation; Transcribe to consent form and obtain patient signature    Nursing Order: Transcribe to consent form and obtain patient signature. Note: Always confirm laterality of pain with Ms. Melillo, before procedure. Procedure: Lumbar Facet Block  under fluoroscopic guidance Indication/Reason: Low Back Pain, with our without leg pain, due to Facet Joint Arthralgia (Joint Pain) known as Lumbar Facet Syndrome, secondary to Lumbar, and/or Lumbosacral Spondylosis (Arthritis of the Spine), without myelopathy or radiculopathy (Nerve Damage). Provider Attestation: I, Murdock Dossie Arbour, MD, (Pain Management Specialist), the  physician/practitioner, attest that I have discussed with the patient the benefits, risks, side effects, alternatives, likelihood of achieving goals and potential problems during recovery for the procedure that I have provided informed consent.  . Provide equipment / supplies at bedside    Equipment required: Single use, disposable, "Block Tray"    Standing Status:   Standing    Number of Occurrences:   1    Order Specific Question:   Specify    Answer:   Block Tray  . Latex precautions    Activate Latex-Free Protocol.    Standing Status:   Standing    Number of Occurrences:   1   Chronic Opioid Analgesic:  Tramadol50 mg,2tabs (100 mg) PO q 6 hrs (400 mg/day of tramadol) MME/day:40mg /day.   Medications ordered for procedure: Meds ordered this encounter  Medications  . lidocaine (XYLOCAINE) 2 % (with pres) injection 400 mg  . lactated ringers infusion 1,000 mL  . midazolam (VERSED) 5 MG/5ML injection 1-2 mg    Make sure Flumazenil is available in the pyxis when using this medication. If oversedation occurs, administer 0.2 mg IV over 15 sec. If after 45 sec no response, administer 0.2 mg again over 1 min; may repeat at 1 min intervals; not to exceed 4 doses (1 mg)  . fentaNYL (SUBLIMAZE) injection 25-50 mcg    Make sure Narcan is available in the pyxis when using this medication. In the event of respiratory depression (RR< 8/min): Titrate NARCAN (naloxone) in increments of 0.1 to 0.2 mg IV at 2-3 minute intervals, until desired degree of reversal.  . ropivacaine (PF) 2 mg/mL (0.2%) (NAROPIN) injection 18 mL  . triamcinolone acetonide (KENALOG-40) injection 80 mg   Medications administered: We administered lidocaine, lactated ringers, midazolam, fentaNYL, ropivacaine (PF) 2 mg/mL (0.2%), and triamcinolone acetonide.  See the medical record for exact dosing, route, and time of administration.  Follow-up plan:   Return in about 2 weeks (around  06/17/2019) for (VV), (PP).         Interventional management options:  Considering:   NOTE: LATEX ALLERGY & Hx Hepatitis C (w/ cirrhosis) Diagnostic left LESI Possible bilateral lumbar facetRFA Diagnostic bilateral thoracic facet block Possiblebilateral thoracic facetRFA Possible bilateral cervical facetRFA Diagnostic bilateral GONB Diagnostic trigger point injections   Palliative PRN treatment(s):   Palliative/diagnosticbilateral lumbar facet block#3 Palliative/diagnostic bilateralcervicalfacet block#2      Recent Visits Date Type Provider Dept  05/19/19 Office Visit Milinda Pointer, MD [facility]  Showing recent visits within past 90 days and meeting all other requirements   Today's Visits Date Type Provider Dept  06/03/19 Procedure visit Milinda Pointer, MD [facility]  Showing today's visits and meeting all other requirements   Future Appointments Date Type Provider Dept  06/18/19 Appointment Milinda Pointer, MD [facility]  07/30/19 Appointment Milinda Pointer, MD [facility]  Showing future appointments within next 90 days and meeting all other requirements   Disposition: Discharge home  Discharge (Date  Time): 06/03/2019; 0959 hrs.   Primary Care Physician: Mellody Dance, DO Location: [facility] Note by: Gaspar Cola, MD Date: 06/03/2019; Time: 10:53 AM  Disclaimer:  Medicine is not an exact science. The only guarantee in medicine is that nothing is guaranteed. It is important to note that the decision to proceed with this intervention was based on the information collected from the patient. The Data and conclusions were drawn from the patient's questionnaire, the interview, and the physical examination. Because the information was provided in large part by the patient, it cannot be guaranteed that it has not been purposely or unconsciously manipulated. Every effort has been made to obtain as much  relevant data as possible for this evaluation. It is important to note that the conclusions that lead to this procedure are derived in large part from the available data. Always take into account that the treatment will also be dependent on availability of resources and existing treatment guidelines, considered by other Pain Management Practitioners as being common knowledge and practice, at the time of the intervention. For Medico-Legal purposes, it is also important to point out that variation in procedural techniques and pharmacological choices are the acceptable norm. The indications, contraindications, technique, and results of the above procedure should only be interpreted and judged by a Board-Certified Interventional Pain Specialist with extensive familiarity and expertise in the same exact procedure and technique.

## 2019-06-03 NOTE — Progress Notes (Signed)
Safety precautions to be maintained throughout the outpatient stay will include: orient to surroundings, keep bed in low position, maintain call bell within reach at all times, provide assistance with transfer out of bed and ambulation.  

## 2019-06-03 NOTE — Patient Instructions (Signed)

## 2019-06-04 ENCOUNTER — Telehealth: Payer: Self-pay

## 2019-06-04 NOTE — Telephone Encounter (Signed)
Post procedure phone call.  Patient states she is doing real good.  

## 2019-06-15 ENCOUNTER — Other Ambulatory Visit: Payer: Self-pay | Admitting: Adult Health

## 2019-06-16 ENCOUNTER — Other Ambulatory Visit: Payer: Self-pay | Admitting: Adult Health

## 2019-06-17 NOTE — Progress Notes (Signed)
Patient: Rachael West  Service Category: E/M  Provider: Gaspar Cola, MD  DOB: [birth year]  DOS: 06/18/2019  Location: Office  MRN: [id number]  Setting: Ambulatory outpatient  Referring Provider: Mellody Dance, DO  Type: Established Patient  Specialty: Interventional Pain Management  PCP: No primary care provider on file.  Location: Remote location  Delivery: TeleHealth     Virtual Encounter - Pain Management PROVIDER NOTE: Information contained herein reflects review and annotations entered in association with encounter. Interpretation of such information and data should be left to medically-trained personnel. Information provided to patient can be located elsewhere in the medical record under "Patient Instructions". Document created using STT-dictation technology, any transcriptional errors that may result from process are unintentional.    Contact & Pharmacy Preferred: [phone] Home: [phone] (home) Mobile: [phone] (mobile) E-mail: [email or web address]  [id number] [place] [id number]:: [phone]:: [phone]  Pre-screening  Rachael West offered "in-person" vs "virtual" encounter. She indicated preferring virtual for this encounter.   Reason COVID-19*  Social distancing based on CDC and AMA recommendations.   I contacted Rachael West 06/18/2019 via telephone.      I clearly identified myself as FGaspar Cola MD. I verified that I was speaking with the correct person using two identifiers (Name: Rachael West and date of birth: [birth year].  Consent I sought verbal advanced consent from Rachael West virtual visit interactions. I informed Rachael West of possible security and privacy concerns, risks, and limitations associated with providing "not-in-person" medical evaluation and management services. I also informed Ms.  West of the availability of "in-person" appointments. Finally, I informed her that there would be a charge for the virtual visit and that she could be  personally, fully or partially, financially responsible for it. Ms. MMignognaexpressed understanding and agreed to proceed.   Historic Elements   Ms. LJOSSELYNE ONOFRIOis a 64y.o. year old, female patient evaluated today after her last contact with our practice on 06/04/2019. Rachael West a past medical history of Anemia, Anxiety, Arthritis, Asthma, Depression, GERD (gastroesophageal reflux disease), Hepatitis-C, History of chemotherapy, Hypertension, Insomnia, [place]), Personal history of non-Hodgkin lymphomas, and Vitamin D deficiency. She also  West a past surgical history that includes Appendectomy; Trigger finger release; Esophagogastroduodenoscopy (01/17/2011); Colonoscopy (01/17/2011); Abdominal hysterectomy; and Breast biopsy (Left, 2015). Ms. MSilerhas a current medication list which includes the following prescription(s): atenolol, buspirone, gnp calcium 1200, dimenhydrinate, fluticasone-salmeterol, hydroxyzine, ipratropium-albuterol, latanoprost, linaclotide, lisinopril, lubiprostone, magnesium, mirabegron er, montelukast, omeprazole, ondansetron, paliperidone, polyethylene glycol, prazosin, proair hfa, sertraline, tramadol, trazodone, and benefiber. She  reports that she quit smoking about 29 years ago. Her smoking use included cigarettes. She West a 30.00 pack-year smoking history. She West never used smokeless tobacco. She reports that she does not drink alcohol or use drugs. Ms. MGuevarrais allergic to latex; penicillins; codeine; cortisone; and sulfa antibiotics.   HPI  Today, she is being contacted for a post-procedure assessment.  According to the patient, she had excellent relief with the therapeutic bilateral lumbar facet block to the point where she is doing 90% relief of the pain that  seems to be ongoing.  At this time, she indicates not really needing anything else [place]  We have made arrangements for her to come back for her follow-up medication management visit on 07/30/2019.  According to our records, the patient should have enough medicine to last until  08/03/2019.  Post-Procedure Evaluation  Procedure: Therapeutic bilateral lumbar facet block #3 under fluoroscopic guidance and IV sedation Pre-procedure pain level: 5/10 Post-procedure: 0/10 (100% relief)  Sedation: Sedation provided.  Effectiveness during initial hour after procedure(Ultra-Short Term Relief): 100 % .  Local anesthetic used: Long-acting (4-6 hours) Effectiveness: Defined as any analgesic benefit obtained secondary to the administration of local anesthetics. This carries significant diagnostic value as to the etiological location, or anatomical origin, of the pain. Duration of benefit is expected to coincide with the duration of the local anesthetic used.  Effectiveness during initial 4-6 hours after procedure(Short-Term Relief): 100 % .  Long-term benefit: Defined as any relief past the pharmacologic duration of the local anesthetics.  Effectiveness past the initial 6 hours after procedure(Long-Term Relief): 90 %(present) .  Current benefits: Defined as benefit that persist at this time.   Analgesia:  90% ongoing relief of the pain Function: Rachael West reports improvement in function ROM: Rachael West reports improvement in ROM  Pharmacotherapy Assessment  Analgesic: Tramadol50 mg,2tabs (100 mg) PO q 6 hrs (400 mg/day of tramadol) MME/day:'40mg'$ /day.   Monitoring: [place] PMP: PDMP reviewed during this encounter.       Pharmacotherapy: No side-effects or adverse reactions reported. Compliance: No problems identified. Effectiveness: Clinically acceptable. Plan: Refer to "POC".  UDS:  Summary  Date Value Ref Range Status  05/22/2019 Note  Final    Comment:     ==================================================================== Tox[employer] Select 13 (MW) ==================================================================== Test                             Result       Flag       Units Drug Present and Declared for Prescription Verification   Tramadol                       >16129       EXPECTED   ng/mg creat   O-Desmethyltramadol            11787        EXPECTED   ng/mg creat   N-Desmethyltramadol            7242         EXPECTED   ng/mg creat    Source of tramadol is a prescription medication. O-desmethyltramadol    and N-desmethyltramadol are expected metabolites of tramadol. ==================================================================== Test                      Result    Flag   Units      Ref Range   Creatinine              31               mg/dL      >=20 ==================================================================== Declared Medications:  The flagging and interpretation on this report are based on the  following declared medications.  Unexpected results may arise from  inaccuracies in the declared medications.  **Note: The testing scope of this panel includes these medications:  Tramadol (Ultram)  **Note: The testing scope of this panel does not include the  following reported medications:  Albuterol  Albuterol (Duoneb)  Atenolol  Buspirone  Calcium  Dimenhydrinate  Eye Drop  Fluticasone (Advair)  Hydroxyzine (Vistaril)  Ipratropium (Duoneb)  Lisinopril (Zestril)  Lubiprostone (Amitiza)  Magnesium  Mirabegron (Myrbetriq)  Montelukast (Singulair)  Omeprazole (Prilosec)  Ondansetron (Zofran)  Paliperidone (Invega)  Polyethylene Glycol (MiraLAX)  Prazosin (Minipress)  Salmeterol (Advair)  Sertraline (Zoloft)  Supplement  Trazodone (Desyrel)  Vitamin D ==================================================================== For clinical consultation, please call [phone]. ====================================================================     Laboratory Chemistry Profile   Renal Lab Results  Component Value Date   BUN 14 08/05/2018   CREATININE 0.98 08/05/2018   BCR 9 (L) 05/29/2018   GFRAA >60 08/05/2018   GFRNONAA >60 08/05/2018     Hepatic Lab Results  Component Value Date   AST 21 08/05/2018   ALT 15 08/05/2018   ALBUMIN 3.5 08/05/2018   ALKPHOS 72 08/05/2018   HCVAB REACTIVE (A) 01/12/2014   LIPASE 34 10/31/2010     Electrolytes Lab Results  Component Value Date   NA 139 08/05/2018   K 4.1 08/05/2018   CL 104 08/05/2018   CALCIUM 9.4 08/05/2018   MG 2.2 08/29/2017     Bone Lab Results  Component Value Date   25OHVITD1 21 (L) 08/29/2017   25OHVITD2 <1.0 08/29/2017   25OHVITD3 21 08/29/2017     Inflammation (CRP: Acute Phase) (ESR: Chronic Phase) Lab Results  Component Value Date   CRP 12 (H) 08/29/2017   ESRSEDRATE 46 (H) 08/29/2017       Note: Above Lab results reviewed.   Imaging  DG PAIN CLINIC C-ARM 1-60 MIN NO REPORT Fluoro was used, but no Radiologist interpretation will be provided.  Please refer to "NOTES" tab for provider progress note.  Assessment  The primary encounter diagnosis was Chronic pain syndrome. Diagnoses of Chronic low back pain (1ry area of Pain) (Bilateral) (L>R) w/o sciatica, Lumbar facet syndrome (Bilateral) (L>R), Chronic lower extremity pain (2ry area of Pain) (Left), Chronic upper back pain (3ry area of Pain) (Bilateral) (L>R), Chronic neck pain (4th area of Pain) (Bilateral) (L>R), Cervical facet syndrome (Bilateral) (R>L), and Cervicalgia were also pertinent to this visit.  Plan of Care  Problem-specific:  No problem-specific Assessment & Plan notes found for this encounter.  Rachael West a current medication list which includes the following long-term medication(s): atenolol, gnp calcium 1200, fluticasone-salmeterol, ipratropium-albuterol, linaclotide,  lisinopril, lubiprostone, magnesium, mirabegron er, montelukast, omeprazole, paliperidone, prazosin, proair hfa, sertraline, tramadol, trazodone, and benefiber.  Pharmacotherapy (Medications Ordered): No orders of the defined types were placed in this encounter.  Orders:  No orders of the defined types were placed in this encounter.  Follow-up plan:   Return for regular appointment.      Interventional management options:  Considering:   NOTE: LATEX ALLERGY & Hx Hepatitis C (w/ cirrhosis) Diagnostic left LESI Possible bilateral lumbar facetRFA Diagnostic bilateral thoracic facet block Possiblebilateral thoracic facetRFA Possible bilateral cervical facetRFA Diagnostic bilateral GONB Diagnostic trigger point injections   Palliative PRN treatment(s):   Palliative/diagnosticbilateral lumbar facet block#4(100/100/90/90) Palliative/diagnostic bilateralcervicalfacet block#2    Recent Visits Date Type Provider Dept  06/03/19 Procedure visit Milinda Pointer, MD [facility]  05/19/19 Office Visit Milinda Pointer, MD [facility]  Showing recent visits within past 90 days and meeting all other requirements   Today's Visits Date Type Provider Dept  06/18/19 Telemedicine Milinda Pointer, MD [facility]  Showing today's visits and meeting all other requirements   Future Appointments Date Type Provider Dept  07/30/19 Appointment Milinda Pointer, MD [facility]  Showing future appointments within next 90 days and meeting all other requirements   I discussed the assessment and treatment plan with the patient. The patient was provided an opportunity to ask questions and all were answered. The  patient agreed with the plan and demonstrated an understanding of the instructions.  Patient advised to call back or seek an in-person evaluation if the symptoms or condition worsens.  Duration of encounter: 12 minutes.  Note by:  Gaspar Cola, MD Date: 06/18/2019; Time: 3:55 PM

## 2019-06-18 ENCOUNTER — Ambulatory Visit: Payer: Medicare Other | Attending: Pain Medicine | Admitting: Pain Medicine

## 2019-06-18 ENCOUNTER — Telehealth: Payer: Self-pay | Admitting: *Deleted

## 2019-06-18 ENCOUNTER — Other Ambulatory Visit: Payer: Self-pay

## 2019-06-18 DIAGNOSIS — M79605 Pain in left leg: Secondary | ICD-10-CM | POA: Diagnosis not present

## 2019-06-18 DIAGNOSIS — G894 Chronic pain syndrome: Secondary | ICD-10-CM | POA: Diagnosis not present

## 2019-06-18 DIAGNOSIS — M47816 Spondylosis without myelopathy or radiculopathy, lumbar region: Secondary | ICD-10-CM

## 2019-06-18 DIAGNOSIS — M549 Dorsalgia, unspecified: Secondary | ICD-10-CM

## 2019-06-18 DIAGNOSIS — M545 Low back pain: Secondary | ICD-10-CM | POA: Diagnosis not present

## 2019-06-18 DIAGNOSIS — G8929 Other chronic pain: Secondary | ICD-10-CM

## 2019-06-18 DIAGNOSIS — M542 Cervicalgia: Secondary | ICD-10-CM

## 2019-06-18 DIAGNOSIS — M47812 Spondylosis without myelopathy or radiculopathy, cervical region: Secondary | ICD-10-CM

## 2019-06-18 NOTE — Telephone Encounter (Signed)
Called patient re; VV, she was still sleeping.  Asked SO to please ask her to call when she gets up so we can review information in chart.

## 2019-06-27 ENCOUNTER — Other Ambulatory Visit: Payer: Self-pay | Admitting: Physician Assistant

## 2019-06-29 ENCOUNTER — Other Ambulatory Visit: Payer: Self-pay | Admitting: Adult Health

## 2019-06-29 ENCOUNTER — Other Ambulatory Visit: Payer: Self-pay | Admitting: Pain Medicine

## 2019-06-29 DIAGNOSIS — G8929 Other chronic pain: Secondary | ICD-10-CM

## 2019-06-29 DIAGNOSIS — E559 Vitamin D deficiency, unspecified: Secondary | ICD-10-CM

## 2019-07-01 ENCOUNTER — Other Ambulatory Visit: Payer: Self-pay | Admitting: Adult Health

## 2019-07-03 ENCOUNTER — Other Ambulatory Visit: Payer: Self-pay | Admitting: Adult Health

## 2019-07-18 ENCOUNTER — Other Ambulatory Visit: Payer: Self-pay | Admitting: Gastroenterology

## 2019-07-29 NOTE — Progress Notes (Signed)
No show

## 2019-07-29 NOTE — Patient Instructions (Signed)

## 2019-07-30 ENCOUNTER — Ambulatory Visit (HOSPITAL_BASED_OUTPATIENT_CLINIC_OR_DEPARTMENT_OTHER): Payer: Medicare Other | Admitting: Pain Medicine

## 2019-07-30 DIAGNOSIS — G894 Chronic pain syndrome: Secondary | ICD-10-CM

## 2019-07-31 DIAGNOSIS — R6889 Other general symptoms and signs: Secondary | ICD-10-CM | POA: Diagnosis not present

## 2019-08-03 ENCOUNTER — Other Ambulatory Visit: Payer: Self-pay | Admitting: Family Medicine

## 2019-08-03 DIAGNOSIS — J4520 Mild intermittent asthma, uncomplicated: Secondary | ICD-10-CM

## 2019-08-04 ENCOUNTER — Ambulatory Visit (HOSPITAL_COMMUNITY): Payer: Medicare Other

## 2019-08-04 ENCOUNTER — Inpatient Hospital Stay: Payer: Medicare Other

## 2019-08-05 ENCOUNTER — Other Ambulatory Visit: Payer: Self-pay | Admitting: Hematology and Oncology

## 2019-08-05 ENCOUNTER — Ambulatory Visit (HOSPITAL_COMMUNITY)
Admission: RE | Admit: 2019-08-05 | Discharge: 2019-08-05 | Disposition: A | Discharge status: Home / Self Care | Payer: Medicare Other | Source: Ambulatory Visit | Attending: Hematology and Oncology | Admitting: Hematology and Oncology

## 2019-08-05 ENCOUNTER — Inpatient Hospital Stay: Payer: Medicare Other | Attending: Hematology and Oncology

## 2019-08-05 ENCOUNTER — Encounter (HOSPITAL_COMMUNITY): Payer: Self-pay

## 2019-08-05 ENCOUNTER — Other Ambulatory Visit: Payer: Self-pay

## 2019-08-05 DIAGNOSIS — R918 Other nonspecific abnormal finding of lung field: Secondary | ICD-10-CM | POA: Diagnosis not present

## 2019-08-05 DIAGNOSIS — Z9221 Personal history of antineoplastic chemotherapy: Secondary | ICD-10-CM | POA: Diagnosis not present

## 2019-08-05 DIAGNOSIS — C8590 Non-Hodgkin lymphoma, unspecified, unspecified site: Secondary | ICD-10-CM | POA: Diagnosis not present

## 2019-08-05 DIAGNOSIS — C8510 Unspecified B-cell lymphoma, unspecified site: Secondary | ICD-10-CM

## 2019-08-05 DIAGNOSIS — R6889 Other general symptoms and signs: Secondary | ICD-10-CM | POA: Diagnosis not present

## 2019-08-05 DIAGNOSIS — Z8572 Personal history of non-Hodgkin lymphomas: Secondary | ICD-10-CM | POA: Insufficient documentation

## 2019-08-05 LAB — CMP (CANCER CENTER ONLY)
ALT: 12 U/L (ref 0–44)
AST: 14 U/L — ABNORMAL LOW (ref 15–41)
Albumin: 3.5 g/dL (ref 3.5–5.0)
Alkaline Phosphatase: 80 U/L (ref 38–126)
Anion gap: 9 (ref 5–15)
BUN: 7 mg/dL — ABNORMAL LOW (ref 8–23)
CO2: 28 mmol/L (ref 22–32)
Calcium: 9.8 mg/dL (ref 8.9–10.3)
Chloride: 105 mmol/L (ref 98–111)
Creatinine: 0.84 mg/dL (ref 0.44–1.00)
GFR, Est AFR Am: >60 mL/min (ref >60)
GFR, Estimated: >60 mL/min (ref >60)
Glucose, Bld: 99 mg/dL (ref 70–99)
Potassium: 4.1 mmol/L (ref 3.5–5.1)
Sodium: 142 mmol/L (ref 135–145)
Total Bilirubin: 0.3 mg/dL (ref 0.3–1.2)
Total Protein: 7.5 g/dL (ref 6.5–8.1)

## 2019-08-05 LAB — CBC WITH DIFFERENTIAL (CANCER CENTER ONLY)
Abs Immature Granulocytes: 0.02 10*3/uL (ref 0.00–0.07)
Basophils Absolute: 0.1 10*3/uL (ref 0.0–0.1)
Basophils Relative: 1 %
Eosinophils Absolute: 0.1 10*3/uL (ref 0.0–0.5)
Eosinophils Relative: 2 %
HCT: 39.1 % (ref 36.0–46.0)
Hemoglobin: 12.2 g/dL (ref 12.0–15.0)
Immature Granulocytes: 0 %
Lymphocytes Relative: 48 %
Lymphs Abs: 3 10*3/uL (ref 0.7–4.0)
MCH: 25.9 pg — ABNORMAL LOW (ref 26.0–34.0)
MCHC: 31.2 g/dL (ref 30.0–36.0)
MCV: 83 fL (ref 80.0–100.0)
Monocytes Absolute: 0.5 10*3/uL (ref 0.1–1.0)
Monocytes Relative: 8 %
Neutro Abs: 2.5 10*3/uL (ref 1.7–7.7)
Neutrophils Relative %: 41 %
Platelet Count: 212 10*3/uL (ref 150–400)
RBC: 4.71 MIL/uL (ref 3.87–5.11)
RDW: 13.9 % (ref 11.5–15.5)
WBC Count: 6.2 10*3/uL (ref 4.0–10.5)
nRBC: 0 % (ref 0.0–0.2)

## 2019-08-05 LAB — LACTATE DEHYDROGENASE: LDH: 144 U/L (ref 98–192)

## 2019-08-05 MED ORDER — SODIUM CHLORIDE (PF) 0.9 % IJ SOLN
INTRAMUSCULAR | Status: AC
  Filled 2019-08-05: qty 50

## 2019-08-05 MED ORDER — IOHEXOL 300 MG/ML  SOLN
100.0000 mL | Freq: Once | INTRAMUSCULAR | Status: AC | PRN
  Administered 2019-08-05: 100 mL via INTRAVENOUS

## 2019-08-05 NOTE — Progress Notes (Signed)
Patient Care Team: Mellody Dance, DO as PCP - General (Family Medicine) Laurence Spates, MD (Inactive) as Consulting Physician (Gastroenterology) Loletha Carrow Kirke Corin, MD as Consulting Physician (Gastroenterology) Elsie Saas, MD as Consulting Physician (Orthopedic Surgery)  DIAGNOSIS:    ICD-10-CM   1. 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 PET activity in the bone marrow and spleen thought to be related to chemotherapy rather than disease. (Stage 2B)   05/29/2014 PET scan   Significant improvement, with reduction in size and activity of the left supraclavicular and left axillary lymph node ; reduced activity in the normal size spleen; and resolution of the diffuse hypermetabolic marrow activity.   02/02/2015 Imaging   Borderline left axillary lymph node same size, no abdominal or pelvic lymphadenopathy, hepatomegaly mild cirrhosis , improving right middle lobe airspace disease. No evidence of lymphoma   07/31/2016 Imaging   CT CAP; no findings of lymphoma, index left axillary lymph node 9 mm within normal limits     CHIEF COMPLIANT: Follow-up of diffuse large B-cell lymphoma  INTERVAL HISTORY: Rachael West is a 64 y.o. with above-mentioned history of diffuse large B-cell lymphoma who is in complete remission. CT CAP on 08/05/19 showed no evidence  of recurrent disease and minimal progression in a 55m ground-glass nodule in the left lower lobe recommended for attention on follow-up. She presents to the clinic today for annual follow-up.  ALLERGIES:  is allergic to latex, penicillins, codeine, cortisone, and sulfa antibiotics.  MEDICATIONS:  Current Outpatient Medications  Medication Sig Dispense Refill  . atenolol (TENORMIN) 25 MG tablet TAKE 1 TABLET EVERY DAY 90 tablet 0  . busPIRone (BUSPAR) 5 MG tablet Take 1 tablet (5 mg total) by mouth 2 (two) times daily. (Patient taking differently: Take 5 mg by mouth 3 (three) times daily. )    . Calcium Carbonate-Vit D-Min (GNP CALCIUM 1200) 1200-1000 MG-UNIT CHEW Chew 1,200 mg by mouth daily with breakfast. Take in combination with vitamin D and magnesium. 30 tablet 10  . dimenhyDRINATE (DRAMAMINE) 50 MG tablet Take 50 mg by mouth every 8 (eight) hours as needed.    . Fluticasone-Salmeterol (ADVAIR DISKUS) 250-50 MCG/DOSE AEPB Inhale 1 puff into the lungs 2 (two) times daily. 1 each 2  . hydrOXYzine (VISTARIL) 50 MG capsule Take 50 mg by mouth at bedtime.    .Marland Kitchenipratropium-albuterol (DUONEB) 0.5-2.5 (3) MG/3ML SOLN INHALE 3 MLS VIA NEBULIZER INTO THE LUNGS. SEE ADMIN INSTRUCTIONS 360 mL 2  . latanoprost (XALATAN) 0.005 % ophthalmic solution Place 1 drop into both eyes daily.    .Marland Kitchenlisinopril (ZESTRIL) 5 MG tablet TAKE 1 TABLET BY MOUTH EVERY DAY 90 tablet 0  . lubiprostone (AMITIZA) 24 MCG capsule Take 1 capsule (24 mcg total) by mouth 2 (two) times daily with a meal. Swallow the medication whole. Do not break or chew the medication. 60 capsule 5  .  Magnesium 500 MG CAPS Take 1 capsule (500 mg total) by mouth 2 (two) times daily at 8 am and 10 pm. 180 capsule 3  . mirabegron ER (MYRBETRIQ) 25 MG TB24 tablet Take 1 tablet (25 mg total) by mouth daily. Needs to come from the Urologist- no further RFs will be provided 30 tablet 0  . montelukast (SINGULAIR) 10 MG tablet TAKE 1 TABLET BY MOUTH EVERY DAY  90 tablet 3  . omeprazole (PRILOSEC) 40 MG capsule TAKE 1 CAPSULE BY MOUTH EVERY DAY 90 capsule 1  . ondansetron (ZOFRAN) 4 MG tablet TAKE 1 TABLET BY MOUTH EVERY 8 HOURS AS NEEDED FOR NAUSEA AND VOMITING 45 tablet 3  . paliperidone (INVEGA) 6 MG 24 hr tablet Take 6 mg by mouth every morning.    . polyethylene glycol (MIRALAX / GLYCOLAX) packet Take 17 g by mouth 2 (two) times daily.    . prazosin (MINIPRESS) 1 MG capsule Take 1 mg by mouth at bedtime.    Marland Kitchen PROAIR HFA 108 (90 Base) MCG/ACT inhaler INHALE 2 PUFFS INTO THE LUNGS 4 (FOUR) TIMES DAILY. 18 g 5  . sertraline (ZOLOFT) 100 MG tablet Take 200 mg by mouth every morning.    . traMADol (ULTRAM) 50 MG tablet Take 2 tablets (100 mg total) by mouth every 6 (six) hours as needed for severe pain. Must last 30 days 240 tablet 5  . traZODone (DESYREL) 50 MG tablet 1 TABLET BY MOUTH AT BEDTIME  5  . Wheat Dextrin (BENEFIBER) POWD Take 6 g by mouth 3 (three) times daily before meals. (2 tsp = 6 g) 730 g 10   No current facility-administered medications for this visit.    PHYSICAL EXAMINATION: ECOG PERFORMANCE STATUS: 1 - Symptomatic but completely ambulatory  Vitals:   08/06/19 0924  BP: 115/66  Pulse: 96  Resp: 17  Temp: 98.7 F (37.1 C)  SpO2: 99%   Filed Weights   08/06/19 0924  Weight: 190 lb (86.2 kg)    LABORATORY DATA:  I have reviewed the data as listed CMP Latest Ref Rng & Units 08/05/2019 08/05/2018 05/29/2018  Glucose 70 - 99 mg/dL 99 92 89  BUN 8 - 23 mg/dL 7(L) 14 8  Creatinine 0.44 - 1.00 mg/dL 0.84 0.98 0.86  Sodium 135 - 145 mmol/L 142 139 138  Potassium 3.5 - 5.1 mmol/L 4.1 4.1 4.7  Chloride 98 - 111 mmol/L 105 104 101  CO2 22 - 32 mmol/L _0 Calcium 8.9 - 10.3 mg/dL 9.8 9.4 9.1  Total Protein 6.5 - 8.1 g/dL 7.5 7.5 6.9  Total Bilirubin 0.3 - 1.2 mg/dL 0.3 0.3 0.2  Alkaline Phos 38 - 126 U/L 80 72 82  AST 15 - 41 U/L 14(L) 21 24  ALT 0 - 44 U/L _1 Lab Results  Component Value Date   WBC 6.2  08/05/2019   HGB 12.2 08/05/2019   HCT 39.1 08/05/2019   MCV 83.0 08/05/2019   PLT 212 08/05/2019   NEUTROABS 2.5 08/05/2019    ASSESSMENT & PLAN:  Large B-cell lymphoma (HCC) Aggressive large B-cell lymphoma: CD5 and 10 negative, CD20 positive BCL 2 and BCL 6 positive: Differential diagnosis aggressive large B-cell lymphoma versus B-cell lymphoma unclassified, Ki-67 100%, bulky lymphadenopathy supraclavicular and axillary lymph nodes, bone marrow negative, stage IIB   Treatment Summary: R CHOP chemotherapy started 01/16/2014 completed 05/01/14  PET-CT 05/29/14: Near CR  CT CAP7/23/2018:No evidence of lymphoma CT CAP: 08/05/19: No suspicious  lymphadenopathy to suggest recurrent lymphoma. Spleen is normal in size. 13 mm ground-glass nodule with cystic lucencies in the left lower lobe, possibly minimally progressive  No additional routine scans are planned having completed 5 years of surveillance CTs Return to clinic in 1 year for follow-up with labs   Orders Placed This Encounter  Procedures  . CBC with Differential ([facility] Only)    Standing Status:   Future    Standing Expiration Date:   08/05/2020  . CMP ([place] only)    Standing Status:   Future    Standing Expiration Date:   08/05/2020  . Lactate dehydrogenase    Standing Status:   Future    Standing Expiration Date:   08/05/2020   The patient has a good understanding of the overall plan. she agrees with it. she will call with any problems that may develop before the next visit here.  Total time spent: 20 mins including face to face time and time spent for planning, charting and coordination of care  Nicholas Lose, MD 08/06/2019  I, Cloyde Reams Dorshimer, am acting as scribe for Dr. Nicholas Lose.  I have reviewed the above documentation for accuracy and completeness, and I agree with the above.

## 2019-08-06 ENCOUNTER — Inpatient Hospital Stay (HOSPITAL_BASED_OUTPATIENT_CLINIC_OR_DEPARTMENT_OTHER): Payer: Medicare Other | Admitting: Hematology and Oncology

## 2019-08-06 ENCOUNTER — Other Ambulatory Visit: Payer: Self-pay

## 2019-08-06 DIAGNOSIS — C8510 Unspecified B-cell lymphoma, unspecified site: Secondary | ICD-10-CM | POA: Diagnosis not present

## 2019-08-06 DIAGNOSIS — R6889 Other general symptoms and signs: Secondary | ICD-10-CM | POA: Diagnosis not present

## 2019-08-06 DIAGNOSIS — Z9221 Personal history of antineoplastic chemotherapy: Secondary | ICD-10-CM | POA: Diagnosis not present

## 2019-08-06 DIAGNOSIS — Z8572 Personal history of non-Hodgkin lymphomas: Secondary | ICD-10-CM | POA: Diagnosis not present

## 2019-08-06 NOTE — Assessment & Plan Note (Signed)
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 Spleen is normal in size. 13 mm ground-glass nodule with cystic lucencies in the left lower lobe, possibly minimally progressive  No additional routine scans are planned having completed 5 years of surveillance CTs 

## 2019-08-07 ENCOUNTER — Encounter: Payer: Self-pay | Admitting: Pain Medicine

## 2019-08-07 ENCOUNTER — Ambulatory Visit: Payer: Medicare Other | Attending: Pain Medicine | Admitting: Pain Medicine

## 2019-08-07 ENCOUNTER — Ambulatory Visit: Payer: Medicare Other | Admitting: Physician Assistant

## 2019-08-07 VITALS — BP 138/68 | HR 81 | Temp 97.3°F | Ht 64.0 in | Wt 190.0 lb

## 2019-08-07 DIAGNOSIS — G4486 Cervicogenic headache: Secondary | ICD-10-CM

## 2019-08-07 DIAGNOSIS — M50122 Cervical disc disorder at C5-C6 level with radiculopathy: Secondary | ICD-10-CM | POA: Insufficient documentation

## 2019-08-07 DIAGNOSIS — M1991 Primary osteoarthritis, unspecified site: Secondary | ICD-10-CM | POA: Diagnosis not present

## 2019-08-07 DIAGNOSIS — G8929 Other chronic pain: Secondary | ICD-10-CM | POA: Insufficient documentation

## 2019-08-07 DIAGNOSIS — G894 Chronic pain syndrome: Secondary | ICD-10-CM | POA: Insufficient documentation

## 2019-08-07 DIAGNOSIS — M5030 Other cervical disc degeneration, unspecified cervical region: Secondary | ICD-10-CM | POA: Diagnosis not present

## 2019-08-07 DIAGNOSIS — M5412 Radiculopathy, cervical region: Secondary | ICD-10-CM | POA: Diagnosis not present

## 2019-08-07 DIAGNOSIS — M4802 Spinal stenosis, cervical region: Secondary | ICD-10-CM | POA: Diagnosis not present

## 2019-08-07 DIAGNOSIS — M79601 Pain in right arm: Secondary | ICD-10-CM | POA: Diagnosis not present

## 2019-08-07 DIAGNOSIS — M792 Neuralgia and neuritis, unspecified: Secondary | ICD-10-CM | POA: Insufficient documentation

## 2019-08-07 DIAGNOSIS — R519 Headache, unspecified: Secondary | ICD-10-CM | POA: Diagnosis not present

## 2019-08-07 DIAGNOSIS — M542 Cervicalgia: Secondary | ICD-10-CM | POA: Insufficient documentation

## 2019-08-07 DIAGNOSIS — Z79899 Other long term (current) drug therapy: Secondary | ICD-10-CM | POA: Insufficient documentation

## 2019-08-07 DIAGNOSIS — R6889 Other general symptoms and signs: Secondary | ICD-10-CM | POA: Diagnosis not present

## 2019-08-07 MED ORDER — TRAMADOL HCL 50 MG PO TABS: 100.0000 mg | ORAL_TABLET | Freq: Four times a day (QID) | ORAL | 2 refills | Status: DC | PRN

## 2019-08-07 MED ORDER — MELOXICAM 15 MG PO TABS: 15.0000 mg | ORAL_TABLET | Freq: Every day | ORAL | 2 refills | Status: DC

## 2019-08-07 NOTE — Patient Instructions (Addendum)
____________________________________________________________________________________________  Preparing for Procedure with Sedation  Procedure appointments are limited to planned procedures: . No Prescription Refills. . No disability issues will be discussed. . No medication changes will be discussed.  Instructions: . Oral Intake: Do not eat or drink anything for at least 8 hours prior to your procedure. (Exception: Blood Pressure Medication. See below.) . Transportation: Unless otherwise stated by your physician, you may drive yourself after the procedure. . Blood Pressure Medicine: Do not forget to take your blood pressure medicine with a sip of water the morning of the procedure. If your Diastolic (lower reading)is above 100 mmHg, elective cases will be cancelled/rescheduled. . Blood thinners: These will need to be stopped for procedures. Notify our staff if you are taking any blood thinners. Depending on which one you take, there will be specific instructions on how and when to stop it. . Diabetics on insulin: Notify the staff so that you can be scheduled 1st case in the morning. If your diabetes requires high dose insulin, take only  of your normal insulin dose the morning of the procedure and notify the staff that you have done so. . Preventing infections: Shower with an antibacterial soap the morning of your procedure. . Build-up your immune system: Take 1000 mg of Vitamin C with every meal (3 times a day) the day prior to your procedure. . Antibiotics: Inform the staff if you have a condition or reason that requires you to take antibiotics before dental procedures. . Pregnancy: If you are pregnant, call and cancel the procedure. . Sickness: If you have a cold, fever, or any active infections, call and cancel the procedure. . Arrival: You must be in the facility at least 30 minutes prior to your scheduled procedure. . Children: Do not bring children with you. . Dress appropriately:  Bring dark clothing that you would not mind if they get stained. . Valuables: Do not bring any jewelry or valuables.  Reasons to call and reschedule or cancel your procedure: (Following these recommendations will minimize the risk of a serious complication.) . Surgeries: Avoid having procedures within 2 weeks of any surgery. (Avoid for 2 weeks before or after any surgery). . Flu Shots: Avoid having procedures within 2 weeks of a flu shots or . (Avoid for 2 weeks before or after immunizations). . Barium: Avoid having a procedure within 7-10 days after having had a radiological study involving the use of radiological contrast. (Myelograms, Barium swallow or enema study). . Heart attacks: Avoid any elective procedures or surgeries for the initial 6 months after a "Myocardial Infarction" (Heart Attack). . Blood thinners: It is imperative that you stop these medications before procedures. Let us know if you if you take any blood thinner.  . Infection: Avoid procedures during or within two weeks of an infection (including chest colds or gastrointestinal problems). Symptoms associated with infections include: Localized redness, fever, chills, night sweats or profuse sweating, burning sensation when voiding, cough, congestion, stuffiness, runny nose, sore throat, diarrhea, nausea, vomiting, cold or Flu symptoms, recent or current infections. It is specially important if the infection is over the area that we intend to treat. . Heart and lung problems: Symptoms that may suggest an active cardiopulmonary problem include: cough, chest pain, breathing difficulties or shortness of breath, dizziness, ankle swelling, uncontrolled high or unusually low blood pressure, and/or palpitations. If you are experiencing any of these symptoms, cancel your procedure and contact your primary care physician for an evaluation.  Remember:  Regular Business hours are:    Monday to Thursday 8:00 AM to 4:00 PM  Provider's  Schedule: Milinda Pointer, MD:  Procedure days: Tuesday and Thursday 7:30 AM to 4:00 PM  Gillis Santa, MD:  Procedure days: Monday and Wednesday 7:30 AM to 4:00 PM ____________________________________________________________________________________________    ____________________________________________________________________________________________  Drug Holidays (Slow)  What is a "Drug Holiday"? Drug Holiday: is the name given to the period of time during which a patient stops taking a medication(s) for the purpose of eliminating tolerance to the drug.  Benefits . Improved effectiveness of opioids. . Decreased opioid dose needed to achieve benefits. . Improved pain with lesser dose.  What is tolerance? Tolerance: is the progressive decreased in effectiveness of a drug due to its repetitive use. With repetitive use, the body gets use to the medication and as a consequence, it loses its effectiveness. This is a common problem seen with opioid pain medications. As a result, a larger dose of the drug is needed to achieve the same effect that used to be obtained with a smaller dose.  How long should a "Drug Holiday" last? You should stay off of the pain medicine for at least 14 consecutive days. (2 weeks)  Should I stop the medicine "cold [place]"? No. You should always coordinate with your Pain Specialist so that he/she can provide you with the correct medication dose to make the transition as smoothly as possible.  How do I stop the medicine? Slowly. You will be instructed to decrease the daily amount of pills that you take by one (1) pill every seven (7) days. This is called a "slow downward taper" of your dose. For example: if you normally take four (4) pills per day, you will be asked to drop this dose to three (3) pills per day for seven (7) days, then to two (2) pills per day for seven (7) days, then to one (1) per day for seven (7) days, and at the end of those last seven (7)  days, this is when the "Drug Holiday" would start.   Will I have withdrawals? By doing a "slow downward taper" like this one, it is unlikely that you will experience any significant withdrawal symptoms. Typically, what triggers withdrawals is the sudden stop of a high dose opioid therapy. Withdrawals can usually be avoided by slowly decreasing the dose over a prolonged period of time. If you do not follow these instructions and decide to stop your medication abruptly, withdrawals may be possible.  What are withdrawals? Withdrawals: refers to the wide range of symptoms that occur after stopping or dramatically reducing opiate drugs after heavy and prolonged use. Withdrawal symptoms do not occur to patients that use low dose opioids, or those who take the medication sporadically. Contrary to benzodiazepine (example: Valium, Xanax, etc.) or alcohol withdrawals ("Delirium Tremens"), opioid withdrawals are not lethal. Withdrawals are the physical manifestation of the body getting rid of the excess receptors.  Expected Symptoms Early symptoms of withdrawal may include: . Agitation . Anxiety . Muscle aches . Increased tearing . Insomnia . Runny nose . Sweating . Yawning  Late symptoms of withdrawal may include: . Abdominal cramping . Diarrhea . Dilated pupils . Goose bumps . Nausea . Vomiting  Will I experience withdrawals? Due to the slow nature of the taper, it is very unlikely that you will experience any.  What is a slow taper? Taper: refers to the gradual decrease in dose.  (Last update: 07/30/2019) ____________________________________________________________________________________________    ____________________________________________________________________________________________  Medication Rules  Purpose: To inform patients, and  their family members, of our rules and regulations.  Applies to: All patients receiving prescriptions (written or electronic).  Pharmacy of  record: Pharmacy where electronic prescriptions will be sent. If written prescriptions are taken to a different pharmacy, please inform the nursing staff. The pharmacy listed in the electronic medical record should be the one where you would like electronic prescriptions to be sent.  Electronic prescriptions: In compliance with the [place] (STOP) Act of 2017 (Session Lanny Cramp [phone]), effective January 09, 2018, all controlled substances must be electronically prescribed. Calling prescriptions to the pharmacy will cease to exist.  Prescription refills: Only during scheduled appointments. Applies to all prescriptions.  NOTE: The following applies primarily to controlled substances (Opioid* Pain Medications).   Type of encounter (visit): For patients receiving controlled substances, face-to-face visits are required. (Not an option or up to the patient.)  Patient's responsibilities: 1. Pain Pills: Bring all pain pills to every appointment (except for procedure appointments). 2. Pill Bottles: Bring pills in original pharmacy bottle. Always bring the newest bottle. Bring bottle, even if empty. 3. Medication refills: You are responsible for knowing and keeping track of what medications you take and those you need refilled. The day before your appointment: write a list of all prescriptions that need to be refilled. The day of the appointment: give the list to the admitting nurse. Prescriptions will be written only during appointments. No prescriptions will be written on procedure days. If you forget a medication: it will not be "Called in", "Faxed", or "electronically sent". You will need to get another appointment to get these prescribed. No early refills. Do not call asking to have your prescription filled early. 4. Prescription Accuracy: You are responsible for carefully inspecting your prescriptions before leaving our office. Have the discharge nurse  carefully go over each prescription with you, before taking them home. Make sure that your name is accurately spelled, that your address is correct. Check the name and dose of your medication to make sure it is accurate. Check the number of pills, and the written instructions to make sure they are clear and accurate. Make sure that you are given enough medication to last until your next medication refill appointment. 5. Taking Medication: Take medication as prescribed. When it comes to controlled substances, taking less pills or less frequently than prescribed is permitted and encouraged. Never take more pills than instructed. Never take medication more frequently than prescribed.  6. Inform other Doctors: Always inform, all of your healthcare providers, of all the medications you take. 7. Pain Medication from other Providers: You are not allowed to accept any additional pain medication from any other Doctor or Healthcare provider. There are two exceptions to this rule. (see below) In the event that you require additional pain medication, you are responsible for notifying us, as stated below. 8. Medication Agreement: You are responsible for carefully reading and following our Medication Agreement. This must be signed before receiving any prescriptions from our practice. Safely store a copy of your signed Agreement. Violations to the Agreement will result in no further prescriptions. (Additional copies of our Medication Agreement are available upon request.) 9. Laws, Rules, & Regulations: All patients are expected to follow all Federal and [employer], [employer], Rules, [employer]. Ignorance of the Laws does not constitute a valid excuse.  10. Illegal drugs and Controlled Substances: The use of illegal substances (including, but not limited to marijuana and its derivatives) and/or the illegal use of any controlled substances is  strictly prohibited. Violation of this rule may result in the immediate and  permanent discontinuation of any and all prescriptions being written by our practice. The use of any illegal substances is prohibited. 11. Adopted CDC guidelines & recommendations: Target dosing levels will be at or below 60 MME/day. Use of benzodiazepines** is not recommended.  Exceptions: There are only two exceptions to the rule of not receiving pain medications from other Healthcare Providers. 1. Exception #1 (Emergencies): In the event of an emergency (i.e.: accident requiring emergency care), you are allowed to receive additional pain medication. However, you are responsible for: As soon as you are able, call our office [phone], at any time of the day or night, and leave a message stating your name, the date and nature of the emergency, and the name and dose of the medication prescribed. In the event that your call is answered by a member of our staff, make sure to document and save the date, time, and the name of the person that took your information.  2. Exception #2 (Planned Surgery): In the event that you are scheduled by another doctor or dentist to have any type of surgery or procedure, you are allowed (for a period no longer than 30 days), to receive additional pain medication, for the acute post-op pain. However, in this case, you are responsible for picking up a copy of our "Post-op Pain Management for Surgeons" handout, and giving it to your surgeon or dentist. This document is available at our office, and does not require an appointment to obtain it. Simply go to our office during business hours (Monday-Thursday from 8:00 AM to 4:00 PM) (Friday 8:00 AM to 12:00 Noon) or if you have a scheduled appointment with [place], prior to your surgery, and ask for it by name. In addition, you will need to provide [place] with your name, name of your surgeon, type of surgery, and date of procedure or surgery.  *Opioid medications include: morphine, codeine, oxycodone, oxymorphone, hydrocodone,  hydromorphone, meperidine, tramadol, tapentadol, buprenorphine, fentanyl, methadone. **Benzodiazepine medications include: diazepam (Valium), alprazolam (Xanax), clonazepam (Klonopine), lorazepam (Ativan), clorazepate (Tranxene), chlordiazepoxide (Librium), estazolam (Prosom), oxazepam (Serax), temazepam (Restoril), triazolam (Halcion) (Last updated: 03/08/2017) ____________________________________________________________________________________________   ____________________________________________________________________________________________  Medication Recommendations and Reminders  Applies to: All patients receiving prescriptions (written and/or electronic).  Medication Rules & Regulations: These rules and regulations exist for your safety and that of others. They are not flexible and neither are we. Dismissing or ignoring them will be considered "non-compliance" with medication therapy, resulting in complete and irreversible termination of such therapy. (See document titled "Medication Rules" for more details.) In all conscience, because of safety reasons, we cannot continue providing a therapy where the patient does not follow instructions.  Pharmacy of record:   Definition: This is the pharmacy where your electronic prescriptions will be sent.   We do not endorse any particular pharmacy, however, we have experienced problems with [facility] not securing enough medication supply for the community.  We do not restrict you in your choice of pharmacy. However, once we write for your prescriptions, we will NOT be re-sending more prescriptions to fix restricted supply problems created by your pharmacy, or your insurance.   The pharmacy listed in the electronic medical record should be the one where you want electronic prescriptions to be sent.  If you choose to change pharmacy, simply notify our nursing staff.  Recommendations:  Keep all of your pain medications in a safe place, under  lock and key, even if  you live alone. We will NOT replace lost, stolen, or damaged medication.  After you fill your prescription, take 1 week's worth of pills and put them away in a safe place. You should keep a separate, properly labeled bottle for this purpose. The remainder should be kept in the original bottle. Use this as your primary supply, until it runs out. Once it's gone, then you know that you have 1 week's worth of medicine, and it is time to come in for a prescription refill. If you do this correctly, it is unlikely that you will ever run out of medicine.  To make sure that the above recommendation works, it is very important that you make sure your medication refill appointments are scheduled at least 1 week before you run out of medicine. To do this in an effective manner, make sure that you do not leave the office without scheduling your next medication management appointment. Always ask the nursing staff to show you in your prescription , when your medication will be running out. Then arrange for the receptionist to get you a return appointment, at least 7 days before you run out of medicine. Do not wait until you have 1 or 2 pills left, to come in. This is very poor planning and does not take into consideration that we may need to cancel appointments due to bad weather, sickness, or emergencies affecting our staff.  DO NOT ACCEPT A "Partial Fill": If for any reason your pharmacy does not have enough pills/tablets to completely fill or refill your prescription, do not allow for a "partial fill". The law allows the pharmacy to complete that prescription within 72 hours, without requiring a new prescription. If they do not fill the rest of your prescription within those 72 hours, you will need a separate prescription to fill the remaining amount, which we will NOT provide. If the reason for the partial fill is your insurance, you will need to talk to the pharmacist about payment alternatives for  the remaining tablets, but again, DO NOT ACCEPT A PARTIAL FILL, unless you can trust your pharmacist to obtain the remainder of the pills within 72 hours.  Prescription refills and/or changes in medication(s):   Prescription refills, and/or changes in dose or medication, will be conducted only during scheduled medication management appointments. (Applies to both, written and electronic prescriptions.)  No refills on procedure days. No medication will be changed or started on procedure days. No changes, adjustments, and/or refills will be conducted on a procedure day. Doing so will interfere with the diagnostic portion of the procedure.  No phone refills. No medications will be "called into the pharmacy".  No Fax refills.  No weekend refills.  No Holliday refills.  No after hours refills.  Remember:  Business hours are:  Monday to Thursday 8:00 AM to 4:00 PM Provider's Schedule: Milinda Pointer, MD - Appointments are:  Medication management: Monday and Wednesday 8:00 AM to 4:00 PM Procedure day: Tuesday and Thursday 7:30 AM to 4:00 PM Gillis Santa, MD - Appointments are:  Medication management: Tuesday and Thursday 8:00 AM to 4:00 PM Procedure day: Monday and Wednesday 7:30 AM to 4:00 PM (Last update: 07/30/2019) ____________________________________________________________________________________________   ____________________________________________________________________________________________  CANNABIDIOL (AKA: CBD Oil or Pills)  Applies to: All patients receiving prescriptions of controlled substances (written and/or electronic).  General Information: Cannabidiol (CBD), a derivative of Marijuana, was discovered in 45. It is one of some 113 identified cannabinoids in cannabis (Marijuana) plants, accounting for up to 40% of  the plant's extract. As of 2018, preliminary clinical research on cannabidiol included studies of anxiety, cognition, movement disorders, and  pain.  Cannabidiol is consummed in multiple ways, including inhalation of cannabis smoke or vapor, as an aerosol spray into the cheek, and by mouth. It may be supplied as CBD oil containing CBD as the active ingredient (no added tetrahydrocannabinol (THC) or terpenes), a full-plant CBD-dominant hemp extract oil, capsules, dried cannabis, or as a liquid solution. CBD is thought not have the same psychoactivity as THC, and may affect the actions of THC. Studies suggest that CBD may interact with different biological targets, including cannabinoid receptors and other neurotransmitter receptors. As of 2018 the mechanism of action for its biological effects has not been determined.  In the [place], cannabidiol has a limited approval by the Food and Drug Administration (FDA) for treatment of only two types of epilepsy disorders. The side effects of long-term use of the drug include somnolence, decreased appetite, diarrhea, fatigue, malaise, weakness, sleeping problems, and others.  CBD remains a Schedule I drug prohibited for any use.  Legality: Some manufacturers ship CBD products nationally, an illegal action which the FDA has not enforced in 2018, with CBD remaining the subject of an FDA investigational new drug evaluation, and is not considered legal as a dietary supplement or food ingredient as of December 2018. Federal illegality has made it difficult historically to conduct research on CBD. CBD is openly sold in head shops and health food stores in some states where such sales have not been explicitly legalized.  Warning: Because it is not FDA approved for general use or treatment of pain, it is not required to undergo the same manufacturing controls as prescription drugs.  This means that the available cannabidiol (CBD) may be contaminated with THC.  If this is the case, it will trigger a positive urine drug screen (UDS) test for cannabinoids (Marijuana).  Because a positive UDS for illicit  substances is a violation of our medication agreement, your opioid analgesics (pain medicine) may be permanently discontinued. (Last update: 07/30/2019) ____________________________________________________________________________________________   Preparing for Procedure with Sedation Instructions: . Oral Intake: Do not eat or drink anything for at least 8 hours prior to your procedure. . Transportation: Public transportation is not allowed. Bring an adult driver. The driver must be physically present in our waiting room before any procedure can be started. Marland Kitchen Physical Assistance: Bring an adult capable of physically assisting you, in the event you need help. . Blood Pressure Medicine: Take your blood pressure medicine with a sip of water the morning of the procedure. . Insulin: Take only  of your normal insulin dose. . Preventing infections: Shower with an antibacterial soap the morning of your procedure. . Build-up your immune system: Take 1000 mg of Vitamin C with every meal (3 times a day) the day prior to your procedure. . Pregnancy: If you are pregnant, call and cancel the procedure. . Sickness: If you have a cold, fever, or any active infections, call and cancel the procedure. . Arrival: You must be in the facility at least 30 minutes prior to your scheduled procedure. . Children: Do not bring children with you. . Dress appropriately: Bring dark clothing that you would not mind if they get stained. . Valuables: Do not bring any jewelry or valuables. Procedure appointments are reserved for interventional treatments only. Marland Kitchen No Prescription Refills. . No medication changes will be discussed during procedure appointments. . No disability issues will be discussed. Marland Kitchen

## 2019-08-07 NOTE — Progress Notes (Signed)
Nursing Pain Medication Assessment:  Safety precautions to be maintained throughout the outpatient stay will include: orient to surroundings, keep bed in low position, maintain call bell within reach at all times, provide assistance with transfer out of bed and ambulation.  Medication Inspection Compliance: Pill count conducted under aseptic conditions, in front of the patient. Neither the pills nor the bottle was removed from the patient's sight at any time. Once count was completed pills were immediately returned to the patient in their original bottle.  Medication: Tramadol (Ultram) Pill/Patch Count: 39 of 240 pills remain Pill/Patch Appearance: Markings consistent with prescribed medication Bottle Appearance: Standard pharmacy container. Clearly labeled. Filled Date: 04 / 13 / 2021 Last Medication intake:  Yesterday

## 2019-08-07 NOTE — Progress Notes (Signed)
PROVIDER NOTE: Information contained herein reflects review and annotations entered in association with encounter. Interpretation of such information and data should be left to medically-trained personnel. Information provided to patient can be located elsewhere in the medical record under "Patient Instructions". Document created using STT-dictation technology, any transcriptional errors that may result from process are unintentional.    Patient: Rachael West  Service Category: E/M  Provider: Gaspar Cola, MD  DOB: 1955/01/20  DOS: 08/07/2019  Specialty: Interventional Pain Management  MRN: [id number]  Setting: Ambulatory outpatient  PCP: Lorrene Reid, PA-C  Type: Established Patient    Referring Provider: No ref. provider found  Location: Office  Delivery: Face-to-face     HPI  Reason for encounter: Rachael West, a 64 y.o. year old female, is here today for evaluation and management of her Chronic pain syndrome [G89.4]. Ms. Voorheis primary complain today is Neck Pain, Shoulder Pain, and Back Pain Last encounter: Practice (06/29/2019). My last encounter with her was on 06/29/2019. Pertinent problems: Ms. Eutsler has Large B-cell lymphoma ([place]); Atypical chest pain; Chronic low back pain (Primary Area of Pain) (Bilateral) w/ sciatica (Left); Chronic lower extremity pain (2ry area of Pain) (Left); Chronic upper back pain (3ry area of Pain) (Bilateral) (L>R); Chronic neck pain (4th area of Pain) (Bilateral) (L>R); Occipital headache; Chronic knee pain (Bilateral) (R>L); Chronic pain syndrome; Chronic sacroiliac joint pain; DDD (degenerative disc disease), lumbar; Lumbar facet arthropathy; Lumbar facet syndrome (Bilateral) (L>R); Spondylosis without myelopathy or radiculopathy, lumbar region; DDD (degenerative disc disease), thoracic; Thoracolumbar dextroscoliosis; Cervicogenic headache; Tendinitis of left rotator cuff; Tendinitis of right rotator cuff; Fibromyalgia;  Chronic shoulder pain (Bilateral) (R>L); Chronic hip pain (Bilateral) (L>R); Cervicalgia; Chronic idiopathic gout; DDD (degenerative disc disease), cervical; 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 Pain Assessment: Severity of Chronic pain is reported as a 2 /10. Location: Neck Mid/radiates down both arms to hands. Onset: More than a month ago. Quality: Aching, Throbbing, Sharp. Timing: Constant. Modifying factor(s): medications, massager. Vitals:  height is _0  (1.626 m) and weight is 190 lb (86.2 kg). Her temperature is 97.3 F (36.3 C) (abnormal). Her blood pressure is 138/68 (abnormal) and her pulse is 81. Her oxygen saturation is 100%.   The patient comes into the clinic today for medication management and refill. The patient indicates doing well with the current medication regimen. No adverse reactions or side effects reported to the medications.  However, she indicates that she has been experiencing a lot more pain in her neck and shoulder.  Further questioning reveals that the patient is having significant decreased range of motion of the cervical spine with pain going to the area of the right shoulder but also going all the way down into the thumb and index finger following a C6 dermatomal distribution.  Physical exam today confirms the patient to have pain on the posterior aspect of the neck on cervical flexion and extension.  Lateral bending as well as rotation triggered the patient's pain going down the right arm into the  thumb and index finger.  She is displaying significant guarding of the neck and arm.  She also presented  with weakness on biceps flexion and wrist extension, again compatible with a C6 radiculopathy.  Arm abduction and abduction were within normal limits deltoid strength was normal wrist flexion, triceps contraction, and finger flexors were all within normal limits bilaterally.  Review of the patient's cervical x-rays done on 09/26/2017 confirmed the patient to have degenerative disc disease primarily affecting the C5-6 and C6-7 levels with bilateral neural foraminal stenosis noted at those 2 levels secondary to uncovertebral spurs and degenerative changes involving the posterior cervical facets, bilaterally.  At the time the patient was having only neck pain.  However, she has recently developed this right-sided C6 cervical radiculopathy and because of this reason today I will be ordering an MRI of the cervical spine.  In addition, in view of the fact that she is having a clinical radiculopathy, will be scheduling her to return for a diagnostic/therapeutic right-sided cervical epidural steroid injection #1 under fluoroscopic guidance and IV sedation.  At this point, the symptoms are overshadowing her usual low back pain.  Pharmacotherapy Assessment   Analgesic: Tramadol50 mg,2tabs (100 mg) PO q 6 hrs (400 mg/day of tramadol) MME/day:19m/day.   Monitoring: [place] PMP: PDMP reviewed during this encounter.       Pharmacotherapy: No side-effects or adverse reactions reported. Compliance: No problems identified. Effectiveness: Clinically acceptable.  TDewayne Shorter RN  08/07/2019 11:47 AM  Signed Nursing Pain Medication Assessment:  Safety precautions to be maintained throughout the outpatient stay will include: orient to surroundings, keep bed in low position, maintain call bell within reach at all times, provide assistance with transfer out of bed and ambulation.  Medication Inspection Compliance:  Pill count conducted under aseptic conditions, in front of the patient. Neither the pills nor the bottle was removed from the patient's sight at any time. Once count was completed pills were immediately returned to the patient in their original bottle.  Medication: Tramadol (Ultram) Pill/Patch Count: 39 of 240 pills remain Pill/Patch Appearance: Markings consistent with prescribed medication Bottle Appearance: Standard pharmacy container. Clearly labeled. Filled Date: 04 / 13 / 2021 Last Medication intake:  Yesterday    UDS:  Summary  Date Value Ref Range Status  05/22/2019 Note  Final    Comment:    ==================================================================== [employer] Select 13 (MW) ==================================================================== Test                             Result       Flag       Units Drug Present and Declared for Prescription Verification   Tramadol                       >16129       EXPECTED   ng/mg creat   O-Desmethyltramadol            11787        EXPECTED   ng/mg creat   N-Desmethyltramadol            7242         EXPECTED   ng/mg creat    Source of tramadol is a prescription medication. O-desmethyltramadol    and N-desmethyltramadol are expected metabolites of tramadol. ==================================================================== Test                      Result    Flag   Units      Ref Range   Creatinine  31               mg/dL      >=20 ==================================================================== Declared Medications:  The flagging and interpretation on this report are based on the  following declared medications.  Unexpected results may arise from  inaccuracies in the declared medications.  **Note: The testing scope of this panel includes these medications:  Tramadol (Ultram)  **Note: The testing scope of this panel does not include the  following reported medications:  Albuterol  Albuterol (Duoneb)   Atenolol  Buspirone  Calcium  Dimenhydrinate  Eye Drop  Fluticasone (Advair)  Hydroxyzine (Vistaril)  Ipratropium (Duoneb)  Lisinopril (Zestril)  Lubiprostone (Amitiza)  Magnesium  Mirabegron (Myrbetriq)  Montelukast (Singulair)  Omeprazole (Prilosec)  Ondansetron (Zofran)  Paliperidone (Invega)  Polyethylene Glycol (MiraLAX)  Prazosin (Minipress)  Salmeterol (Advair)  Sertraline (Zoloft)  Supplement  Trazodone (Desyrel)  Vitamin D ==================================================================== For clinical consultation, please call [phone]. ====================================================================      ROS  Constitutional: Denies any fever or chills Gastrointestinal: No reported hemesis, hematochezia, vomiting, or acute GI distress Musculoskeletal: Denies any acute onset joint swelling, redness, loss of ROM, or weakness Neurological: No reported episodes of acute onset apraxia, aphasia, dysarthria, agnosia, amnesia, paralysis, loss of coordination, or loss of consciousness  Medication Review  Benefiber, Fluticasone-Salmeterol, GNP Calcium 1200, Magnesium, albuterol, atenolol, busPIRone, dimenhyDRINATE, hydrOXYzine, ipratropium-albuterol, latanoprost, lisinopril, lubiprostone, meloxicam, mirabegron ER, montelukast, omeprazole, ondansetron, paliperidone, polyethylene glycol, prazosin, sertraline, traMADol, and traZODone  History Review  Allergy: Ms. Arbaugh is allergic to latex, penicillins, codeine, cortisone, and sulfa antibiotics. Drug: Ms. Reimann  reports no history of drug use. Alcohol:  reports no history of alcohol use. Tobacco:  reports that she quit smoking about 29 years ago. Her smoking use included cigarettes. She has a 30.00 pack-year smoking history. She has never used smokeless tobacco. Social: Ms. Plotz  reports that she quit smoking about 29 years ago. Her smoking use included cigarettes. She has a 30.00 pack-year  smoking history. She has never used smokeless tobacco. She reports that she does not drink alcohol and does not use drugs. Medical:  has a past medical history of Anemia, Anxiety, Arthritis, Asthma, Depression, GERD (gastroesophageal reflux disease), Hepatitis-C, History of chemotherapy, Hypertension, Insomnia, Lymphoma ([place]) (12/2013), Personal history of non-Hodgkin lymphomas, and Vitamin D deficiency. Surgical: Ms. Balthaser  has a past surgical history that includes Appendectomy; Trigger finger release; Esophagogastroduodenoscopy (01/17/2011); Colonoscopy (01/17/2011); Abdominal hysterectomy; and Breast biopsy (Left, 2015). Family: family history includes Allergies in her mother; Asthma in her mother and sister; Breast cancer in her cousin; Colon cancer in her paternal aunt; Diabetes in her maternal grandfather and maternal grandmother; Heart disease in her brother, father, paternal grandfather, and paternal grandmother; Liver cancer in her paternal uncle; Ovarian cancer in an other family member; Pancreatic cancer in an other family member.  Laboratory Chemistry Profile   Renal Lab Results  Component Value Date   BUN 7 (L) 08/05/2019   CREATININE 0.84 08/05/2019   BCR 9 (L) 05/29/2018   GFRAA >60 08/05/2019   GFRNONAA >60 08/05/2019     Hepatic Lab Results  Component Value Date   AST 14 (L) 08/05/2019   ALT 12 08/05/2019   ALBUMIN 3.5 08/05/2019   ALKPHOS 80 08/05/2019   HCVAB REACTIVE (A) 01/12/2014   LIPASE 34 10/31/2010     Electrolytes Lab Results  Component Value Date   NA 142 08/05/2019   K 4.1 08/05/2019   CL 105 08/05/2019   CALCIUM 9.8 08/05/2019  MG 2.2 08/29/2017     Bone Lab Results  Component Value Date   25OHVITD1 21 (L) 08/29/2017   25OHVITD2 <1.0 08/29/2017   25OHVITD3 21 08/29/2017     Inflammation (CRP: Acute Phase) (ESR: Chronic Phase) Lab Results  Component Value Date   CRP 12 (H) 08/29/2017   ESRSEDRATE 46 (H) 08/29/2017       Note: Above  Lab results reviewed.  Recent Imaging Review  CT Chest W Contrast CLINICAL DATA:  Lymphoma, diagnosed 2013, chemotherapy complete in 2016. Annual follow-up. Prior appendectomy and hysterectomy.  EXAM: CT CHEST, ABDOMEN, AND PELVIS WITH CONTRAST  TECHNIQUE: Multidetector CT imaging of the chest, abdomen and pelvis was performed following the standard protocol during bolus administration of intravenous contrast.  CONTRAST:  137m OMNIPAQUE IOHEXOL 300 MG/ML  SOLN  COMPARISON:  08/05/2018  FINDINGS: CT CHEST FINDINGS  Cardiovascular: Heart is normal in size.  No pericardial effusion.  No evidence of thoracic aortic aneurysm. Atherosclerotic calcifications of the aortic arch.  Mediastinum/Nodes: No suspicious mediastinal, axillary, or hilar lymphadenopathy.  Visualized thyroid is unremarkable.  Lungs/Pleura: Platelike scarring/atelectasis in the right middle lobe. Additional lingular scarring/atelectasis.  No focal consolidation.  13 x 12 mm ground-glass nodule with cystic lucencies in the left lower lobe (series 7/image 74), previously 11 mm.  No suspicious solid nodules.  No focal consolidation.  No pleural effusion or pneumothorax.  Musculoskeletal: Thoracic spine is within normal limits.  CT ABDOMEN PELVIS FINDINGS  Hepatobiliary: Liver is within normal limits.  Gallbladder is unremarkable. No intrahepatic or extrahepatic ductal dilatation.  Pancreas: Within normal limits.  Spleen: Normal in size.  Adrenals/Urinary Tract: Adrenal glands are within normal limits.  Kidneys are within normal limits.  No hydronephrosis.  Bladder is within normal limits.  Stomach/Bowel: Stomach is within normal limits.  No evidence of bowel obstruction.  Prior appendectomy.  Vascular/Lymphatic: No evidence of abdominal aortic aneurysm.  Very mild atherosclerotic calcifications of the abdominal aorta and right common iliac artery.  No suspicious abdominopelvic  lymphadenopathy.  Reproductive: Status post hysterectomy.  No adnexal masses.  Other: No abdominopelvic ascites.  Musculoskeletal: Lumbar spine is within normal limits.  IMPRESSION: No suspicious lymphadenopathy to suggest recurrent lymphoma. Spleen is normal in size.  13 mm ground-glass nodule with cystic lucencies in the left lower lobe, possibly minimally progressive. In this patient on annual surveillance, attention on follow-up is suggested.  Additional ancillary findings as above.  Electronically Signed   By: SJulian HyM.D.   On: 08/05/2019 09:41 CT Abdomen Pelvis W Contrast CLINICAL DATA:  Lymphoma, diagnosed 2013, chemotherapy complete in 2016. Annual follow-up. Prior appendectomy and hysterectomy.  EXAM: CT CHEST, ABDOMEN, AND PELVIS WITH CONTRAST  TECHNIQUE: Multidetector CT imaging of the chest, abdomen and pelvis was performed following the standard protocol during bolus administration of intravenous contrast.  CONTRAST:  1038mOMNIPAQUE IOHEXOL 300 MG/ML  SOLN  COMPARISON:  08/05/2018  FINDINGS: CT CHEST FINDINGS  Cardiovascular: Heart is normal in size.  No pericardial effusion.  No evidence of thoracic aortic aneurysm. Atherosclerotic calcifications of the aortic arch.  Mediastinum/Nodes: No suspicious mediastinal, axillary, or hilar lymphadenopathy.  Visualized thyroid is unremarkable.  Lungs/Pleura: Platelike scarring/atelectasis in the right middle lobe. Additional lingular scarring/atelectasis.  No focal consolidation.  13 x 12 mm ground-glass nodule with cystic lucencies in the left lower lobe (series 7/image 74), previously 11 mm.  No suspicious solid nodules.  No focal consolidation.  No pleural effusion or pneumothorax.  Musculoskeletal: Thoracic spine is within normal limits.  CT ABDOMEN PELVIS FINDINGS  Hepatobiliary: Liver is within normal limits.  Gallbladder is unremarkable. No intrahepatic or extrahepatic  ductal dilatation.  Pancreas: Within normal limits.  Spleen: Normal in size.  Adrenals/Urinary Tract: Adrenal glands are within normal limits.  Kidneys are within normal limits.  No hydronephrosis.  Bladder is within normal limits.  Stomach/Bowel: Stomach is within normal limits.  No evidence of bowel obstruction.  Prior appendectomy.  Vascular/Lymphatic: No evidence of abdominal aortic aneurysm.  Very mild atherosclerotic calcifications of the abdominal aorta and right common iliac artery.  No suspicious abdominopelvic lymphadenopathy.  Reproductive: Status post hysterectomy.  No adnexal masses.  Other: No abdominopelvic ascites.  Musculoskeletal: Lumbar spine is within normal limits.  IMPRESSION: No suspicious lymphadenopathy to suggest recurrent lymphoma. Spleen is normal in size.  13 mm ground-glass nodule with cystic lucencies in the left lower lobe, possibly minimally progressive. In this patient on annual surveillance, attention on follow-up is suggested.  Additional ancillary findings as above.  Electronically Signed   By: Julian Hy M.D.   On: 08/05/2019 09:41 Note: Reviewed        Physical Exam  General appearance: Well nourished, well developed, and well hydrated. In no apparent acute distress Mental status: Alert, oriented x 3 (person, place, & time)       Respiratory: No evidence of acute respiratory distress Eyes: PERLA Vitals: BP (!) 138/68   Pulse 81   Temp (!) 97.3 F (36.3 C)   Ht '5\' 4"'$  (1.626 m)   Wt 190 lb (86.2 kg)   SpO2 100%   BMI 32.61 kg/m  BMI: Estimated body mass index is 32.61 kg/m as calculated from the following:   Height as of this encounter: '5\' 4"'$  (1.626 m).   Weight as of this encounter: 190 lb (86.2 kg). Ideal: Ideal body weight: 54.7 kg (120 lb 9.5 oz) Adjusted ideal body weight: 67.3 kg (148 lb 5.7 oz)  Assessment   Status Diagnosis  Worsened Deteriorating Worsening 1. Chronic pain syndrome   2.  Cervical radiculopathy at C6 (Right)   3. Radicular pain of upper extremity (Right)   4. Cervical disc disorder at C5-C6 level with radiculopathy (Right)   5. 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 Chronic upper extremity pain (Right)   7. Cervicalgia   8. DDD (degenerative disc disease), cervical   9. Cervicogenic headache   10. Osteoarthrosis of multiple sites   11. Pharmacologic therapy      Updated Problems: Problem  Cervical disc disorder at C5-C6 level with radiculopathy (Right)  Cervical radiculopathy at C6 (Right)  Osteoarthrosis of multiple sites  Radicular pain of upper extremity (Right)  Chronic upper extremity pain (Right)  Mood Disorder ([facility])    Plan of Care  Problem-specific:  No problem-specific Assessment & Plan notes found for this encounter.  Ms. KANAYA GUNNARSON has a current medication list which includes the following long-term medication(s): atenolol, gnp calcium 1200, fluticasone-salmeterol, ipratropium-albuterol, lisinopril, lubiprostone, magnesium, mirabegron er, montelukast, omeprazole, paliperidone, prazosin, proair hfa, sertraline, trazodone, benefiber, meloxicam, and tramadol.  Pharmacotherapy (Medications Ordered): Meds ordered this encounter  Medications  . traMADol (ULTRAM) 50 MG tablet    Sig: Take 2 tablets (100 mg total) by mouth every 6 (six) hours as needed for severe pain. Must last 30 days    Dispense:  240 tablet    Refill:  2    Chronic Pain: STOP Act (Not applicable) Fill 1 day early if closed on refill date. Do not fill  until: 08/07/2019. To last until: 11/05/2019. Avoid benzodiazepines within 8 hours of opioids  . meloxicam (MOBIC) 15 MG tablet    Sig: Take 1 tablet (15 mg total) by mouth daily.    Dispense:  30 tablet    Refill:  2    Do not add this medication to the electronic "Automatic Refill" notification system. Patient may have prescription filled one day early if pharmacy is closed on scheduled  refill date.   Orders:  Orders Placed This Encounter  Procedures  . Cervical Epidural Injection    Level(s): C7-T1 Laterality: Right-sided Purpose: Diagnostic/Therapeutic Indication(s): Radiculitis and cervicalgia associater with cervical degenerative disc disease.    Standing Status:   Future    Standing Expiration Date:   09/07/2019    Scheduling Instructions:     Procedure: Cervical Epidural Steroid Injection/Block     Sedation: With Sedation.     Timeframe: As soon as schedule allows    Order Specific Question:   Where will this procedure be performed?    Answer:   [facility]    Comments:   by Dr. Dossie Arbour  . MR CERVICAL SPINE WO CONTRAST    Patient presents with axial pain with possible radicular component.  In addition to any acute findings, please report on:  1. Facet (Zygapophyseal) joint DJD (Hypertrophy, space narrowing, subchondral sclerosis, and/or osteophyte formation) 2. DDD and/or IVDD (Loss of disc height, desiccation or "Black disc disease") 3. Pars defects 4. Spondylolisthesis, spondylosis, and/or spondyloarthropathies (include Degree/Grade of displacement in mm) 5. Vertebral body Fractures, including age (old, new/acute) 20. Modic Type Changes 7. Demineralization 8. Bone pathology 9. Central, Lateral Recess, and/or Foraminal Stenosis (include AP diameter of stenosis in mm) 10. Surgical changes (hardware type, status, and presence of fibrosis) NOTE: Please specify level(s) and laterality.    Standing Status:   Future    Standing Expiration Date:   11/07/2019    Order Specific Question:   What is the patient's sedation requirement?    Answer:   No Sedation    Order Specific Question:   Does the patient have a pacemaker or implanted devices?    Answer:   No    Order Specific Question:   Preferred imaging location?    Answer:   [facility] (table limit-350lbs)    Order Specific Question:   Call Results- Best Contact Number?    Answer:   [phone]  [phone] ([facility])    Order Specific Question:   Radiology Contrast Protocol - do NOT remove file path    Answer:   [email or web address]   Follow-up plan:   Return for Procedure (w/ sedation): (R) CESI #1.      Interventional management options:  Considering:   NOTE: LATEX ALLERGY & Hx Hepatitis C (w/ cirrhosis) Diagnostic left LESI Possible bilateral lumbar facetRFA Diagnostic bilateral thoracic facet block Possiblebilateral thoracic facetRFA Possible bilateral cervical facetRFA Diagnostic bilateral GONB Diagnostic trigger point injections   Palliative PRN treatment(s):   Palliative/diagnosticbilateral lumbar facet block#4(100/100/90/90) Palliative/diagnostic bilateralcervicalfacet block#2     Recent Visits Date Type Provider Dept  06/18/19 Telemedicine Milinda Pointer, MD [facility]  06/03/19 Procedure visit Milinda Pointer, MD [facility]  05/19/19 Office Visit Milinda Pointer, MD [facility]  Showing recent visits within past 90 days and meeting all other requirements Today's Visits Date Type Provider Dept  08/07/19 Office Visit Milinda Pointer, MD [facility]  Showing today's visits and meeting all other requirements Future Appointments Date Type Provider Dept  08/14/19 Appointment Milinda Pointer, MD [facility]  10/27/19 Appointment Milinda Pointer, MD [facility]  Showing future appointments within next 90 days and meeting all other requirements  I discussed the assessment and treatment plan with the patient. The patient was provided an opportunity to ask questions and all were answered. The patient agreed with the plan and demonstrated an understanding of the instructions.  Patient advised to call back or seek an in-person evaluation if the symptoms or condition worsens.  Duration of encounter: 35 minutes.  Note by: Gaspar Cola,  MD Date: 08/07/2019; Time: 2:49 PM

## 2019-08-14 ENCOUNTER — Other Ambulatory Visit: Payer: Self-pay

## 2019-08-14 ENCOUNTER — Ambulatory Visit
Admission: RE | Admit: 2019-08-14 | Discharge: 2019-08-14 | Disposition: A | Discharge status: Home / Self Care | Payer: Medicare Other | Source: Ambulatory Visit | Attending: Pain Medicine | Admitting: Pain Medicine

## 2019-08-14 ENCOUNTER — Encounter: Payer: Self-pay | Admitting: Pain Medicine

## 2019-08-14 ENCOUNTER — Other Ambulatory Visit: Payer: Self-pay | Admitting: Family Medicine

## 2019-08-14 ENCOUNTER — Ambulatory Visit (HOSPITAL_BASED_OUTPATIENT_CLINIC_OR_DEPARTMENT_OTHER): Payer: Medicare Other | Admitting: Pain Medicine

## 2019-08-14 VITALS — BP 121/72 | HR 72 | Temp 97.2°F | Resp 12 | Ht 65.0 in | Wt 190.0 lb

## 2019-08-14 DIAGNOSIS — M5136 Other intervertebral disc degeneration, lumbar region: Secondary | ICD-10-CM

## 2019-08-14 DIAGNOSIS — M5442 Lumbago with sciatica, left side: Secondary | ICD-10-CM

## 2019-08-14 DIAGNOSIS — M79605 Pain in left leg: Secondary | ICD-10-CM | POA: Insufficient documentation

## 2019-08-14 DIAGNOSIS — G8929 Other chronic pain: Secondary | ICD-10-CM | POA: Insufficient documentation

## 2019-08-14 DIAGNOSIS — Z91040 Latex allergy status: Secondary | ICD-10-CM | POA: Diagnosis not present

## 2019-08-14 DIAGNOSIS — M51369 Other intervertebral disc degeneration, lumbar region without mention of lumbar back pain or lower extremity pain: Secondary | ICD-10-CM

## 2019-08-14 MED ORDER — LACTATED RINGERS IV SOLN
1000.0000 mL | Freq: Once | INTRAVENOUS | Status: AC
  Administered 2019-08-14: 1000 mL via INTRAVENOUS

## 2019-08-14 MED ORDER — TRIAMCINOLONE ACETONIDE 40 MG/ML IJ SUSP
40.0000 mg | Freq: Once | INTRAMUSCULAR | Status: AC
  Administered 2019-08-14: 40 mg
  Filled 2019-08-14: qty 1

## 2019-08-14 MED ORDER — FENTANYL CITRATE (PF) 100 MCG/2ML IJ SOLN
25.0000 ug | INTRAMUSCULAR | Status: DC | PRN
  Administered 2019-08-14: 50 ug via INTRAVENOUS
  Filled 2019-08-14: qty 2

## 2019-08-14 MED ORDER — ROPIVACAINE HCL 2 MG/ML IJ SOLN
2.0000 mL | Freq: Once | INTRAMUSCULAR | Status: AC
  Administered 2019-08-14: 2 mL via EPIDURAL
  Filled 2019-08-14: qty 10

## 2019-08-14 MED ORDER — LIDOCAINE HCL 2 % IJ SOLN
20.0000 mL | Freq: Once | INTRAMUSCULAR | Status: AC
  Administered 2019-08-14: 400 mg

## 2019-08-14 MED ORDER — IOHEXOL 180 MG/ML  SOLN
10.0000 mL | Freq: Once | INTRAMUSCULAR | Status: AC
  Administered 2019-08-14: 10 mL via EPIDURAL

## 2019-08-14 MED ORDER — SODIUM CHLORIDE 0.9% FLUSH
2.0000 mL | Freq: Once | INTRAVENOUS | Status: AC
  Administered 2019-08-14: 2 mL

## 2019-08-14 MED ORDER — MIDAZOLAM HCL 5 MG/5ML IJ SOLN
1.0000 mg | INTRAMUSCULAR | Status: DC | PRN
  Administered 2019-08-14: 2 mg via INTRAVENOUS
  Filled 2019-08-14: qty 5

## 2019-08-14 NOTE — Patient Instructions (Signed)

## 2019-08-14 NOTE — Progress Notes (Signed)
Safety precautions to be maintained throughout the outpatient stay will include: orient to surroundings, keep bed in low position, maintain call bell within reach at all times, provide assistance with transfer out of bed and ambulation.  

## 2019-08-14 NOTE — Progress Notes (Signed)
PROVIDER NOTE: Information contained herein reflects review and annotations entered in association with encounter. Interpretation of such information and data should be left to medically-trained personnel. Information provided to patient can be located elsewhere in the medical record under "Patient Instructions". Document created using STT-dictation technology, any transcriptional errors that may result from process are unintentional.    Patient: Rachael West  Service Category: Procedure  Provider: Gaspar Cola, MD  DOB: 06-05-1955  DOS: 08/14/2019  Location: [facility]  MRN: [id number]  Setting: Ambulatory - outpatient  Referring Provider: Lorrene Reid, PA-C  Type: Established Patient  Specialty: Interventional Pain Management  PCP: Lorrene Reid, PA-C   Primary Reason for Visit: Interventional Pain Management Treatment. CC: Back Pain  Procedure:          Anesthesia, Analgesia, Anxiolysis:  Type: Diagnostic Epidural Steroid Injection + Diagnostic Epidurogram #1  Region: Caudal Level: Sacrococcygeal   Laterality: Midline aiming at the left  Type: Moderate (Conscious) Sedation combined with Local Anesthesia Indication(s): Analgesia and Anxiety Route: Intravenous (IV) IV Access: Secured Sedation: Meaningful verbal contact was maintained at all times during the procedure  Local Anesthetic: Lidocaine 1-2%  Position: Prone   Indications: 1. 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 Chronic lower extremity pain (2ry area of Pain) (Left)   3. DDD (degenerative disc disease), lumbar   4. Latex precautions, history of latex allergy    Pain Score: Pre-procedure: 6 /10 Post-procedure: 0-No pain/10   Pre-op Assessment:  [name] is a 64 y.o. (year old), female patient, seen today for interventional treatment. She  has a past surgical history that includes Appendectomy; Trigger finger release;  Esophagogastroduodenoscopy (01/17/2011); Colonoscopy (01/17/2011); Abdominal hysterectomy; and Breast biopsy (Left, 2015). [name] has a current medication list which includes the following prescription(s): atenolol, buspirone, gnp calcium 1200, dimenhydrinate, fluticasone-salmeterol, hydroxyzine, ipratropium-albuterol, latanoprost, lisinopril, magnesium, meloxicam, mirabegron er, montelukast, omeprazole, ondansetron, paliperidone, polyethylene glycol, prazosin, proair hfa, sertraline, tramadol, trazodone, benefiber, and lubiprostone, and the following Facility-Administered Medications: fentanyl and midazolam. Her primarily concern today is the Back Pain  Initial Vital Signs:  Pulse/HCG Rate: 80ECG Heart Rate: 80 Temp: (!) 97 F (36.1 C) Resp: 18 BP: 138/80 SpO2: 98 %  BMI: Estimated body mass index is 31.62 kg/m as calculated from the following:   Height as of this encounter: 5\' 5"  (1.651 m).   Weight as of this encounter: 190 lb (86.2 kg).  Risk Assessment: Allergies: Reviewed. She is allergic to latex, penicillins, codeine, cortisone, and sulfa antibiotics.  Allergy Precautions: None required Coagulopathies: Reviewed. None identified.  Blood-thinner therapy: None at this time Active Infection(s): Reviewed. None identified. [name] is afebrile  Site Confirmation: [name] was asked to confirm the procedure and laterality before marking the site Procedure checklist: Completed Consent: Before the procedure and under the influence of no sedative(s), amnesic(s), or anxiolytics, the patient was informed of the treatment options, risks and possible complications. To fulfill our ethical and legal obligations, as recommended by the American Medical Association's Code of Ethics, I have informed the patient of my clinical impression; the nature and purpose of the treatment or procedure; the risks, benefits, and possible complications of the intervention; the alternatives,  including doing nothing; the risk(s) and benefit(s) of the alternative treatment(s) or procedure(s); and the risk(s) and benefit(s) of doing nothing. The patient was provided information about the general risks and possible complications associated with the procedure. These may include, but are not limited to:  failure to achieve desired goals, infection, bleeding, organ or nerve damage, allergic reactions, paralysis, and death. In addition, the patient was informed of those risks and complications associated to Spine-related procedures, such as failure to decrease pain; infection (i.e.: Meningitis, epidural or intraspinal abscess); bleeding (i.e.: epidural hematoma, subarachnoid hemorrhage, or any other type of intraspinal or peri-dural bleeding); organ or nerve damage (i.e.: Any type of peripheral nerve, nerve root, or spinal cord injury) with subsequent damage to sensory, motor, and/or autonomic systems, resulting in permanent pain, numbness, and/or weakness of one or several areas of the body; allergic reactions; (i.e.: anaphylactic reaction); and/or death. Furthermore, the patient was informed of those risks and complications associated with the medications. These include, but are not limited to: allergic reactions (i.e.: anaphylactic or anaphylactoid reaction(s)); adrenal axis suppression; blood sugar elevation that in diabetics may result in ketoacidosis or comma; water retention that in patients with history of congestive heart failure may result in shortness of breath, pulmonary edema, and decompensation with resultant heart failure; weight gain; swelling or edema; medication-induced neural toxicity; particulate matter embolism and blood vessel occlusion with resultant organ, and/or nervous system infarction; and/or aseptic necrosis of one or more joints. Finally, the patient was informed that Medicine is not an exact science; therefore, there is also the possibility of unforeseen or unpredictable risks  and/or possible complications that may result in a catastrophic outcome. The patient indicated having understood very clearly. We have given the patient no guarantees and we have made no promises. Enough time was given to the patient to ask questions, all of which were answered to the patient's satisfaction. Ms. Hillebrand has indicated that she wanted to continue with the procedure. Attestation: I, the ordering provider, attest that I have discussed with the patient the benefits, risks, side-effects, alternatives, likelihood of achieving goals, and potential problems during recovery for the procedure that I have provided informed consent. Date   Time: 08/14/2019  8:48 AM  Pre-Procedure Preparation:  Monitoring: As per clinic protocol. Respiration, ETCO2, SpO2, BP, heart rate and rhythm monitor placed and checked for adequate function Safety Precautions: Patient was assessed for positional comfort and pressure points before starting the procedure. Time-out: I initiated and conducted the "Time-out" before starting the procedure, as per protocol. The patient was asked to participate by confirming the accuracy of the "Time Out" information. Verification of the correct person, site, and procedure were performed and confirmed by me, the nursing staff, and the patient. "Time-out" conducted as per Joint Commission's Universal Protocol (UP.01.01.01). Time: 1019  Description of Procedure:          Target Area: Caudal Epidural Canal. Approach: Midline approach. Area Prepped: Entire Posterior Sacrococcygeal Region DuraPrep (Iodine Povacrylex [0.7% available iodine] and Isopropyl Alcohol, 74% w/w) Safety Precautions: Aspiration looking for blood return was conducted prior to all injections. At no point did we inject any substances, as a needle was being advanced. No attempts were made at seeking any paresthesias. Safe injection practices and needle disposal techniques used. Medications properly checked for  expiration dates. SDV (single dose vial) medications used. Description of the Procedure: Protocol guidelines were followed. The patient was placed in position over the fluoroscopy table. The target area was identified and the area prepped in the usual manner. Skin & deeper tissues infiltrated with local anesthetic. Appropriate amount of time allowed to pass for local anesthetics to take effect. The procedure needles were then advanced to the target area. Proper needle placement secured. Negative aspiration confirmed. Solution injected in intermittent fashion,  asking for systemic symptoms every 0.5cc of injectate. The needles were then removed and the area cleansed, making sure to leave some of the prepping solution back to take advantage of its long term bactericidal properties. Vitals:   08/14/19 1025 08/14/19 1035 08/14/19 1045 08/14/19 1055  BP: 126/79 129/80 120/73 121/72  Pulse: 72     Resp: 17 15 12 12   Temp:  (!) 97.2 F (36.2 C)  (!) 97.2 F (36.2 C)  SpO2: 99% 99% 99% 99%  Weight:      Height:        Start Time: 1019 hrs. End Time: 1024 hrs. Materials:  Needle(s) Type: Epidural needle Gauge: 17G Length: 3.5-in Medication(s): Please see orders for medications and dosing details.  Imaging Guidance (Spinal):          Type of Imaging Technique: Fluoroscopy Guidance (Spinal) Indication(s): Assistance in needle guidance and placement for procedures requiring needle placement in or near specific anatomical locations not easily accessible without such assistance. Exposure Time: Please see nurses notes. Contrast: Before injecting any contrast, we confirmed that the patient did not have an allergy to iodine, shellfish, or radiological contrast. Once satisfactory needle placement was completed at the desired level, radiological contrast was injected. Contrast injected under live fluoroscopy. No contrast complications. See chart for type and volume of contrast used. Fluoroscopic Guidance: I  was personally present during the use of fluoroscopy. "Tunnel Vision Technique" used to obtain the best possible view of the target area. Parallax error corrected before commencing the procedure. "Direction-depth-direction" technique used to introduce the needle under continuous pulsed fluoroscopy. Once target was reached, antero-posterior, oblique, and lateral fluoroscopic projection used confirm needle placement in all planes. Images permanently stored in EMR. Interpretation: I personally interpreted the imaging intraoperatively. Adequate needle placement confirmed in multiple planes. Appropriate spread of contrast into desired area was observed. No evidence of afferent or efferent intravascular uptake. No intrathecal or subarachnoid spread observed. Permanent images saved into the patient's record.  Diagnostic Epidurogram:  Contrast: Before injecting any contrast, we confirmed that the patient did not have an allergy to iodine, shellfish, or radiological contrast. For accuracy purposes, contrast was injected under live fluoroscopy. Study personally interpreted intraoparatively. Type: Non-ionic, water soluble, hypoallergenic, myelogram-compatible, radiological contrast used. Please see orders and nurses note for specific choice of contrast. Volume: Please see nurses note for injected volume.  Observations:  Spinal Alignment: Adequate       Vertebral body: Intact Lamina: Intact Disc: Disc hight preserved Facet: Within Normal Limits.        Hardware: None  Spread: Appropriate epidural spread of contrast Anterior: Adequate Posterior: Adequate Superior (cephalad): Adequate Inferior (caudad): Adequate Right lateral: Adequate Left lateral: Adequate Plica medialis dorsalis: Medially aligned Nerve root(s): Adequate  Epidural extravasation: None observed Intrathecal: No intrathecal spread identified Subarachnoid: No subarachnoid spread pattern observed Vascular: No evidence of afferent or  efferent intravascular uptake  Impression: Technically successful epidurogram. See above for details Note: Hard copies saved to EMR.  Antibiotic Prophylaxis:   Anti-infectives (From admission, onward)   None     Indication(s): None identified  Post-operative Assessment:  Post-procedure Vital Signs:  Pulse/HCG Rate: 72 (nsr)72 Temp: (!) 97.2 F (36.2 C) Resp: 12 BP: 121/72 SpO2: 99 %  EBL: None  Complications: No immediate post-treatment complications observed by team, or reported by patient.  Note: The patient tolerated the entire procedure well. A repeat set of vitals were taken after the procedure and the patient was kept under observation following institutional policy,  for this type of procedure. Post-procedural neurological assessment was performed, showing return to baseline, prior to discharge. The patient was provided with post-procedure discharge instructions, including a section on how to identify potential problems. Should any problems arise concerning this procedure, the patient was given instructions to immediately contact us, at any time, without hesitation. In any case, we plan to contact the patient by telephone for a follow-up status report regarding this interventional procedure.  Comments:  No additional relevant information.  Plan of Care  Orders:  Orders Placed This Encounter  Procedures   Caudal Epidural Injection    Scheduling Instructions:     Laterality: Left-sided     Level(s): Sacrococcygeal canal (Tailbone area)     Sedation: Patient's choice     Timeframe: Today    Order Specific Question:   Where will this procedure be performed?    Answer:   [facility]   [facility] C-ARM 1-60 MIN NO REPORT    Intraoperative interpretation by procedural physician at [facility].    Standing Status:   Standing    Number of Occurrences:   1    Order Specific Question:   Reason for exam:    Answer:   Assistance in needle guidance and  placement for procedures requiring needle placement in or near specific anatomical locations not easily accessible without such assistance.   Informed Consent Details: Physician/Practitioner Attestation; Transcribe to consent form and obtain patient signature    Nursing Order: Transcribe to consent form and obtain patient signature. Note: Always confirm laterality of pain with Ms. Fennell, before procedure. Procedure: Caudal epidural steroid injection Indication/Reason: Low back pain and lower extremity pain secondary to lumbosacral radiculitis Provider Attestation: I, [place] Dossie Arbour, MD, (Pain Management Specialist), the physician/practitioner, attest that I have discussed with the patient the benefits, risks, side effects, alternatives, likelihood of achieving goals and potential problems during recovery for the procedure that I have provided informed consent.   Provide equipment / supplies at bedside    Equipment required: Single use, disposable, "Epidural Tray" Epidural Catheter: NOT required    Standing Status:   Standing    Number of Occurrences:   1    Order Specific Question:   Specify    Answer:   Epidural Tray   Latex precautions    Activate Latex-Free Protocol.    Standing Status:   Standing    Number of Occurrences:   1   Chronic Opioid Analgesic:  Tramadol50 mg,2tabs (100 mg) PO q 6 hrs (400 mg/day of tramadol) MME/day:40mg /day.   Medications ordered for procedure: Meds ordered this encounter  Medications   iohexol (OMNIPAQUE) 180 MG/ML injection 10 mL    Must be Myelogram-compatible. If not available, you may substitute with a water-soluble, non-ionic, hypoallergenic, myelogram-compatible radiological contrast medium.   lidocaine (XYLOCAINE) 2 % (with pres) injection 400 mg   lactated ringers infusion 1,000 mL   midazolam (VERSED) 5 MG/5ML injection 1-2 mg    Make sure Flumazenil is available in the pyxis when using this medication. If oversedation  occurs, administer 0.2 mg IV over 15 sec. If after 45 sec no response, administer 0.2 mg again over 1 min; may repeat at 1 min intervals; not to exceed 4 doses (1 mg)   fentaNYL (SUBLIMAZE) injection 25-50 mcg    Make sure Narcan is available in the pyxis when using this medication. In the event of respiratory depression (RR< 8/min): Titrate NARCAN (naloxone) in increments of 0.1 to 0.2 mg IV  at 2-3 minute intervals, until desired degree of reversal.   sodium chloride flush (NS) 0.9 % injection 2 mL   ropivacaine (PF) 2 mg/mL (0.2%) (NAROPIN) injection 2 mL   triamcinolone acetonide (KENALOG-40) injection 40 mg   Medications administered: We administered iohexol, lidocaine, lactated ringers, midazolam, fentaNYL, sodium chloride flush, ropivacaine (PF) 2 mg/mL (0.2%), and triamcinolone acetonide.  See the medical record for exact dosing, route, and time of administration.  Follow-up plan:   Return in about 2 weeks (around 08/28/2019) for F2F encounter, 20-min, PP-(on procedure day).       Interventional management options:  Considering:   NOTE: LATEX ALLERGY & Hx Hepatitis C (w/ cirrhosis) Diagnostic left LESI Possible bilateral lumbar facetRFA Diagnostic bilateral thoracic facet block Possiblebilateral thoracic facetRFA Possible bilateral cervical facetRFA Diagnostic bilateral GONB Diagnostic trigger point injections   Palliative PRN treatment(s):   Palliative/diagnosticbilateral lumbar facet block#4(100/100/90/90) Palliative/diagnostic bilateralcervicalfacet block#2      Recent Visits Date Type Provider Dept  08/07/19 Office Visit Milinda Pointer, MD [facility]  06/18/19 Telemedicine Milinda Pointer, MD [facility]  06/03/19 Procedure visit Milinda Pointer, MD [facility]  05/19/19 Office Visit Milinda Pointer, MD [facility]  Showing recent visits within past 90 days and meeting all other requirements Today's  Visits Date Type Provider Dept  08/14/19 Procedure visit Milinda Pointer, MD [facility]  Showing today's visits and meeting all other requirements Future Appointments Date Type Provider Dept  08/28/19 Appointment Milinda Pointer, MD [facility]  10/27/19 Appointment Milinda Pointer, MD [facility]  Showing future appointments within next 90 days and meeting all other requirements  Disposition: Discharge home  Discharge (Date   Time): 08/14/2019; 1103 hrs.   Primary Care Physician: Lorrene Reid, PA-C Location: [facility] Note by: Gaspar Cola, MD Date: 08/14/2019; Time: 12:10 PM  Disclaimer:  Medicine is not an Chief Strategy Officer. The only guarantee in medicine is that nothing is guaranteed. It is important to note that the decision to proceed with this intervention was based on the information collected from the patient. The Data and conclusions were drawn from the patient's questionnaire, the interview, and the physical examination. Because the information was provided in large part by the patient, it cannot be guaranteed that it has not been purposely or unconsciously manipulated. Every effort has been made to obtain as much relevant data as possible for this evaluation. It is important to note that the conclusions that lead to this procedure are derived in large part from the available data. Always take into account that the treatment will also be dependent on availability of resources and existing treatment guidelines, considered by other Pain Management Practitioners as being common knowledge and practice, at the time of the intervention. For Medico-Legal purposes, it is also important to point out that variation in procedural techniques and pharmacological choices are the acceptable norm. The indications, contraindications, technique, and results of the above procedure should only be interpreted and judged by a  Board-Certified Interventional Pain Specialist with extensive familiarity and expertise in the same exact procedure and technique.

## 2019-08-15 ENCOUNTER — Telehealth: Payer: Self-pay

## 2019-08-15 NOTE — Telephone Encounter (Signed)
Post procedure phone call.  Patient states she feels pretty good.

## 2019-08-28 ENCOUNTER — Ambulatory Visit: Payer: Medicare Other | Admitting: Pain Medicine

## 2019-08-28 ENCOUNTER — Other Ambulatory Visit: Payer: Self-pay | Admitting: Adult Health

## 2019-09-02 ENCOUNTER — Encounter: Payer: Self-pay | Admitting: Pain Medicine

## 2019-09-03 ENCOUNTER — Ambulatory Visit: Payer: Medicare Other | Attending: Pain Medicine | Admitting: Pain Medicine

## 2019-09-03 ENCOUNTER — Other Ambulatory Visit: Payer: Self-pay

## 2019-09-03 DIAGNOSIS — G894 Chronic pain syndrome: Secondary | ICD-10-CM

## 2019-09-03 DIAGNOSIS — M542 Cervicalgia: Secondary | ICD-10-CM

## 2019-09-03 DIAGNOSIS — M79605 Pain in left leg: Secondary | ICD-10-CM

## 2019-09-03 DIAGNOSIS — M549 Dorsalgia, unspecified: Secondary | ICD-10-CM | POA: Diagnosis not present

## 2019-09-03 DIAGNOSIS — M545 Low back pain, unspecified: Secondary | ICD-10-CM

## 2019-09-03 DIAGNOSIS — Z79899 Other long term (current) drug therapy: Secondary | ICD-10-CM

## 2019-09-03 DIAGNOSIS — K5903 Drug induced constipation: Secondary | ICD-10-CM

## 2019-09-03 DIAGNOSIS — G8929 Other chronic pain: Secondary | ICD-10-CM

## 2019-09-03 DIAGNOSIS — T402X5A Adverse effect of other opioids, initial encounter: Secondary | ICD-10-CM

## 2019-09-03 MED ORDER — LUBIPROSTONE 24 MCG PO CAPS: 24.0000 ug | ORAL_CAPSULE | Freq: Two times a day (BID) | ORAL | 5 refills | Status: DC

## 2019-09-03 NOTE — Progress Notes (Signed)
Patient: Rachael West  Service Category: E/M  Provider: Gaspar Cola, MD  DOB: [birth year]  DOS: 09/03/2019  Location: Office  MRN: [id number]  Setting: Ambulatory outpatient  Referring Provider: Lorrene Reid, PA-C  Type: Established Patient  Specialty: Interventional Pain Management  PCP: Lorrene Reid, PA-C  Location: Remote location  Delivery: TeleHealth     Virtual Encounter - Pain Management PROVIDER NOTE: Information contained herein reflects review and annotations entered in association with encounter. Interpretation of such information and data should be left to medically-trained personnel. Information provided to patient can be located elsewhere in the medical record under "Patient Instructions". Document created using STT-dictation technology, any transcriptional errors that may result from process are unintentional.    Contact & Pharmacy Preferred: [phone] Home: [phone] (home) Mobile: [phone] (mobile) E-mail: [email or web address]  [id number] [id number]:: [phone]:: [phone]  Pre-screening  Rachael West offered "in-person" vs "virtual" encounter. She indicated preferring virtual for this encounter.   Reason COVID-19*   Social distancing based on CDC and AMA recommendations.   I contacted Rachael Fergusonon 09/03/2019 via telephone.      I clearly identified myself as FGaspar Cola MD. I verified that I was speaking with the correct person using two identifiers (Name: Rachael West and date of birth: [birth year].  Consent I sought verbal advanced consent from Rachael Fergusonfor virtual visit interactions. I informed Rachael West of possible security and privacy concerns, risks, and limitations associated with providing "not-in-person" medical evaluation and management services. I also informed Rachael West of the  availability of "in-person" appointments. Finally, I informed her that there would be a charge for the virtual visit and that she could be  personally, fully or partially, financially responsible for it. Ms. MBaleexpressed understanding and agreed to proceed.   Historic Elements   Ms. LMALAYZIA LAFORTEis a 64y.o. year old, female patient evaluated today after her last contact with our practice on 08/15/2019. Ms. MGuse has a past medical history of Anemia, Anxiety, Arthritis, Asthma, Depression, GERD (gastroesophageal reflux disease), Hepatitis-C, History of chemotherapy, Hypertension, Insomnia, [place] Personal history of non-Hodgkin lymphomas, and Vitamin D deficiency. She also  has a past surgical history that includes Appendectomy; Trigger finger release; Esophagogastroduodenoscopy (01/17/2011); Colonoscopy (01/17/2011); Abdominal hysterectomy; and Breast biopsy (Left, 2015). Ms. MHoppeshas a current medication list which includes the following prescription(s): atenolol, b complex vitamins, buspirone, dimenhydrinate, fluticasone-salmeterol, hydroxyzine, ipratropium-albuterol, latanoprost, lisinopril, magnesium, meloxicam, mirabegron er, montelukast, multiple vitamin, omeprazole, ondansetron, paliperidone, polyethylene glycol, prazosin, proair hfa, sertraline, tramadol, trazodone, benefiber, and lubiprostone. She  reports that she quit smoking about 29 years ago. Her smoking use included cigarettes. She has a 30.00 pack-year smoking history. She has never used smokeless tobacco. She reports that she does not drink alcohol and does not use drugs. Ms. MOrlowskiis allergic to latex, penicillins, codeine, cortisone, and sulfa antibiotics.   HPI  Today, she is being contacted for a post-procedure assessment.  The patient indicates currently doing extremely well after her caudal epidural steroid injection.  She claims to currently having minimal pain.  She also  indicated that at this point she does not feel like she needs anything.  She did run out of her Amitiza and therefore today we will go ahead and send some refills on this.  Post-Procedure Evaluation  Procedure (08/14/2019): Diagnostic midline caudal epidural steroid injection + epidurogram #1 under fluoroscopic  guidance and IV sedation Pre-procedure pain level: 6/10 Post-procedure: 0/10 (100% relief)  Sedation: Sedation provided.  Effectiveness during initial hour after procedure(Ultra-Short Term Relief): 100 %.  Local anesthetic used: Long-acting (4-6 hours) Effectiveness: Defined as any analgesic benefit obtained secondary to the administration of local anesthetics. This carries significant diagnostic value as to the etiological location, or anatomical origin, of the pain. Duration of benefit is expected to coincide with the duration of the local anesthetic used.  Effectiveness during initial 4-6 hours after procedure(Short-Term Relief): 100 %.  Long-term benefit: Defined as any relief past the pharmacologic duration of the local anesthetics.  Effectiveness past the initial 6 hours after procedure(Long-Term Relief): 100 % (lasted 7-8 days).  Current benefits: Defined as benefit that persist at this time.   Analgesia:  90-100% better Function: Rachael West reports improvement in function ROM: Rachael West reports improvement in ROM  Pharmacotherapy Assessment  Analgesic: Tramadol50 mg,2tabs (100 mg) PO q 6 hrs (400 mg/day of tramadol) MME/day:55m/day.   Monitoring: [place] PMP: PDMP reviewed during this encounter.       Pharmacotherapy: No side-effects or adverse reactions reported. Compliance: No problems identified. Effectiveness: Clinically acceptable. Plan: Refer to "POC".  UDS:  Summary  Date Value Ref Range Status  05/22/2019 Note  Final    Comment:    ==================================================================== [employer] Select 13  (MW) ==================================================================== Test                             Result       Flag       Units Drug Present and Declared for Prescription Verification   Tramadol                       >16129       EXPECTED   ng/mg creat   O-Desmethyltramadol            11787        EXPECTED   ng/mg creat   N-Desmethyltramadol            7242         EXPECTED   ng/mg creat    Source of tramadol is a prescription medication. O-desmethyltramadol    and N-desmethyltramadol are expected metabolites of tramadol. ==================================================================== Test                      Result    Flag   Units      Ref Range   Creatinine              31               mg/dL      >=20 ==================================================================== Declared Medications:  The flagging and interpretation on this report are based on the  following declared medications.  Unexpected results may arise from  inaccuracies in the declared medications.  **Note: The testing scope of this panel includes these medications:  Tramadol (Ultram)  **Note: The testing scope of this panel does not include the  following reported medications:  Albuterol  Albuterol (Duoneb)  Atenolol  Buspirone  Calcium  Dimenhydrinate  Eye Drop  Fluticasone (Advair)  Hydroxyzine (Vistaril)  Ipratropium (Duoneb)  Lisinopril (Zestril)  Lubiprostone (Amitiza)  Magnesium  Mirabegron (Myrbetriq)  Montelukast (Singulair)  Omeprazole (Prilosec)  Ondansetron (Zofran)  Paliperidone (Invega)  Polyethylene Glycol (MiraLAX)  Prazosin (Minipress)  Salmeterol (Advair)  Sertraline (Zoloft)  Supplement  Trazodone (Desyrel)  Vitamin D ==================================================================== For clinical consultation, please call [phone]. ====================================================================     Laboratory Chemistry Profile   Renal Lab Results   Component Value Date   BUN 7 (L) 08/05/2019   CREATININE 0.84 08/05/2019   BCR 9 (L) 05/29/2018   GFRAA >60 08/05/2019   GFRNONAA >60 08/05/2019     Hepatic Lab Results  Component Value Date   AST 14 (L) 08/05/2019   ALT 12 08/05/2019   ALBUMIN 3.5 08/05/2019   ALKPHOS 80 08/05/2019   HCVAB REACTIVE (A) 01/12/2014   LIPASE 34 10/31/2010     Electrolytes Lab Results  Component Value Date   NA 142 08/05/2019   K 4.1 08/05/2019   CL 105 08/05/2019   CALCIUM 9.8 08/05/2019   MG 2.2 08/29/2017     Bone Lab Results  Component Value Date   25OHVITD1 21 (L) 08/29/2017   25OHVITD2 <1.0 08/29/2017   25OHVITD3 21 08/29/2017     Inflammation (CRP: Acute Phase) (ESR: Chronic Phase) Lab Results  Component Value Date   CRP 12 (H) 08/29/2017   ESRSEDRATE 46 (H) 08/29/2017       Note: Above Lab results reviewed.   Imaging  DG PAIN CLINIC C-ARM 1-60 MIN NO REPORT Fluoro was used, but no Radiologist interpretation will be provided.  Please refer to "NOTES" tab for provider progress note.  Assessment  The primary encounter diagnosis was Chronic pain syndrome. Diagnoses of Chronic low back pain (1ry area of Pain) (Bilateral) (L>R) w/o sciatica, Chronic lower extremity pain (2ry area of Pain) (Left), Chronic upper back pain (3ry area of Pain) (Bilateral) (L>R), Chronic neck pain (4th area of Pain) (Bilateral) (L>R), Pharmacologic therapy, and Therapeutic opioid-induced constipation (OIC) were also pertinent to this visit.  Plan of Care  Problem-specific:  No problem-specific Assessment & Plan notes found for this encounter.  Rachael West has a current medication list which includes the following long-term medication(s): atenolol, fluticasone-salmeterol, ipratropium-albuterol, lisinopril, lubiprostone, magnesium, meloxicam, mirabegron er, montelukast, omeprazole, paliperidone, prazosin, proair hfa, sertraline, tramadol, trazodone, and benefiber.  Pharmacotherapy  (Medications Ordered): Meds ordered this encounter  Medications   lubiprostone (AMITIZA) 24 MCG capsule    Sig: Take 1 capsule (24 mcg total) by mouth 2 (two) times daily with a meal. Swallow the medication whole. Do not break or chew the medication.    Dispense:  60 capsule    Refill:  5    Fill one day early if pharmacy is closed on scheduled refill date. May substitute for generic if available. Instruct patient to go down to QD if loose stools becomes a problem.   Orders:  No orders of the defined types were placed in this encounter.  Follow-up plan:   Return in about 2 months (around 11/03/2019) for (20-min), (F2F), (Med Mgmt).      Interventional management options:  Considering:   NOTE: LATEX ALLERGY & Hx Hepatitis C (w/ cirrhosis) Diagnostic left LESI Possible bilateral lumbar facetRFA Diagnostic bilateral thoracic facet block Possiblebilateral thoracic facetRFA Possible bilateral cervical facetRFA Diagnostic bilateral GONB Diagnostic trigger point injections   Palliative PRN treatment(s):   Palliative/diagnosticbilateral lumbar facet block#4(100/100/90/90) Palliative/diagnostic bilateralcervicalfacet block#2       Recent Visits Date Type Provider Dept  08/14/19 Procedure visit Milinda Pointer, MD [facility]  08/07/19 Office Visit Milinda Pointer, MD [facility]  06/18/19 Telemedicine Milinda Pointer, MD [facility]  Showing recent visits within past 90 days and meeting all other requirements Today's Visits Date Type Provider Dept  09/03/19 Telemedicine Milinda Pointer, MD [facility]  Showing today's visits and meeting all other requirements Future Appointments Date Type Provider Dept  10/27/19 Appointment Milinda Pointer, MD [facility]  Showing future appointments within next 90 days and meeting all other requirements  I discussed the assessment and treatment plan with the patient.  The patient was provided an opportunity to ask questions and all were answered. The patient agreed with the plan and demonstrated an understanding of the instructions.  Patient advised to call back or seek an in-person evaluation if the symptoms or condition worsens.  Duration of encounter: 13 minutes.  Note by: Gaspar Cola, MD Date: 09/03/2019; Time: 6:40 PM

## 2019-09-13 ENCOUNTER — Other Ambulatory Visit: Payer: Self-pay | Admitting: Adult Health

## 2019-09-13 DIAGNOSIS — H2513 Age-related nuclear cataract, bilateral: Secondary | ICD-10-CM | POA: Diagnosis not present

## 2019-09-13 DIAGNOSIS — H40033 Anatomical narrow angle, bilateral: Secondary | ICD-10-CM | POA: Diagnosis not present

## 2019-09-25 ENCOUNTER — Other Ambulatory Visit: Payer: Self-pay | Admitting: Physician Assistant

## 2019-09-25 ENCOUNTER — Telehealth: Payer: Self-pay | Admitting: Physician Assistant

## 2019-09-25 NOTE — Telephone Encounter (Signed)
Please call patient to schedule apt for further med refills.  ° °AS,CMA °

## 2019-10-01 ENCOUNTER — Other Ambulatory Visit: Payer: Self-pay | Admitting: Gastroenterology

## 2019-10-02 ENCOUNTER — Other Ambulatory Visit: Payer: Self-pay | Admitting: Adult Health

## 2019-10-06 ENCOUNTER — Telehealth: Payer: Self-pay | Admitting: Physician Assistant

## 2019-10-06 NOTE — Telephone Encounter (Signed)
Called [employer] and they are faxing over form for [place] to fill out.   Will fill out form, have Maritza sign, and fax back to [employer]. AS, CMA

## 2019-10-06 NOTE — Telephone Encounter (Signed)
Patient had reached out to [employer] to acquire about getting a new nebulizer, she was advised by [employer] that it would be quicker if the our office contacted them directly to place order. Their number is [phone]. She said to please contact her too to discuss further details.

## 2019-10-10 ENCOUNTER — Other Ambulatory Visit: Payer: Self-pay | Admitting: Physician Assistant

## 2019-10-22 ENCOUNTER — Other Ambulatory Visit: Payer: Self-pay | Admitting: Adult Health

## 2019-10-25 NOTE — Progress Notes (Addendum)
PROVIDER NOTE: Information contained herein reflects review and annotations entered in association with encounter. Interpretation of such information and data should be left to medically-trained personnel. Information provided to patient can be located elsewhere in the medical record under "Patient Instructions". Document created using STT-dictation technology, any transcriptional errors that may result from process are unintentional.    Patient: Rachael West  Service Category: E/M  Provider: Gaspar Cola, MD  DOB: 1955-08-11  DOS: 10/27/2019  Specialty: Interventional Pain Management  MRN: [id number]  Setting: Ambulatory outpatient  PCP: Lorrene Reid, PA-C  Type: Established Patient    Referring Provider: Lorrene Reid, PA-C  Location: Office  Delivery: Face-to-face     HPI  [name], a 64 y.o. year old female, is here today because of her Chronic pain syndrome [G89.4]. Ms. Konczal primary complain today is Shoulder Pain (right ) and Hip Pain (left, top of buttocks, knot ) Last encounter: My last encounter with her was on 08/14/2019. Pertinent problems: Rachael West has Large B-cell lymphoma ([place]); Atypical chest pain; Chronic low back pain (Primary Area of Pain) (Bilateral) w/ sciatica (Left); Chronic lower extremity pain (2ry area of Pain) (Left); Chronic upper back pain (3ry area of Pain) (Bilateral) (L>R); Chronic neck pain (4th area of Pain) (Bilateral) (L>R); Occipital headache; Chronic knee pain (Bilateral) (R>L); Chronic pain syndrome; Chronic sacroiliac joint pain; DDD (degenerative disc disease), lumbar; Lumbar facet arthropathy; Lumbar facet syndrome (Bilateral) (L>R); Spondylosis without myelopathy or radiculopathy, lumbar region; DDD (degenerative disc disease), thoracic; Thoracolumbar dextroscoliosis; Cervicogenic headache; Tendinitis of left rotator cuff; Tendinitis of right rotator cuff; Fibromyalgia; Chronic shoulder pain (Bilateral) (R>L);  Chronic hip pain (Bilateral) (L>R); Cervicalgia; Chronic idiopathic gout; DDD (degenerative disc disease), cervical; 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 Pain Assessment: Severity of Chronic pain is reported as a 7 /10. Location: Shoulder (see visit info) Left/dowen left arm into hands. Onset: More than a month ago. Quality: Discomfort, Constant, Numbness, Throbbing (weakness). Timing: Constant. Modifying factor(s): not using the arm or holding it still.. Vitals:  height is 5' 6"  (1.676 m) and weight is 193 lb (87.5 kg). Her oral temperature is 98.2 F (36.8 C). Her blood pressure is 168/89 (abnormal) and her pulse is 89. Her respiration is 16 and oxygen saturation is 100%.   Reason for encounter: medication management.  She initially sent a notification through "MY CHART" system indicating that she did not have a ride and wanted to cancel the appointment.  However, after we had canceled it, she then call and indicated that she had a ride and she showed up to the appointment.  In addition, the patient indicates having some no pain in the area of the right shoulder, neck, and going down the arm with a lot of weakness and numbness.  She indicates that this is affecting the thumb and index finger and what appears to be a C6 dermatomal distribution.  Provocative physical exam today demonstrated weakness on biceps flexion as well as wrist extension.  In addition, she demonstrated deltoid weakness, suggesting a C5 and C6 radiculopathy.  The  patient indicates doing well with the current medication regimen. No  adverse reactions or side effects reported to the medications.   In addition to the above, she also complained of some left hip discomfort which she describes as a "knot" she also indicates that it is referring some pain down the lateral aspect of the leg to the ankle, but not going into the foot.  She denies any numbness or weakness associated with that one.  Nonopioids transfer (10/27/2019): Magnesium, Mobic, Amitiza, and Benefiber.  Pharmacotherapy Assessment   Analgesic: Tramadol50 mg,2tabs (100 mg) PO q 6 hrs (400 mg/day of tramadol) MME/day:42m/day.   Monitoring: [place] PMP: PDMP reviewed during this encounter.       Pharmacotherapy: No side-effects or adverse reactions reported. Compliance: No problems identified. Effectiveness: Clinically acceptable.  PJanett Billow RN  10/27/2019 11:08 AM  Signed Nursing Pain Medication Assessment:  Safety precautions to be maintained throughout the outpatient stay will include: orient to surroundings, keep bed in low position, maintain call bell within reach at all times, provide assistance with transfer out of bed and ambulation.  Medication Inspection Compliance: Pill count conducted under aseptic conditions, in front of the patient. Neither the pills nor the bottle was removed from the patient's sight at any time. Once count was completed pills were immediately returned to the patient in their original bottle.  Medication: Tramadol (Ultram) Pill/Patch Count: 91 of 240 pills remain Pill/Patch Appearance: Markings consistent with prescribed medication Bottle Appearance: Standard pharmacy container. Clearly labeled. Filled Date: 08 / 17 / 2021 Last Medication intake:  Yesterday    UDS:  Summary  Date Value Ref Range Status  05/22/2019 Note  Final    Comment:    ==================================================================== [employer] Select 13  (MW) ==================================================================== Test                             Result       Flag       Units Drug Present and Declared for Prescription Verification   Tramadol                       >16129       EXPECTED   ng/mg creat   O-Desmethyltramadol            11787        EXPECTED   ng/mg creat   N-Desmethyltramadol            7242         EXPECTED   ng/mg creat    Source of tramadol is a prescription medication. O-desmethyltramadol    and N-desmethyltramadol are expected metabolites of tramadol. ==================================================================== Test                      Result    Flag   Units      Ref Range   Creatinine              31               mg/dL      >=20 ==================================================================== Declared Medications:  The flagging and interpretation on this report are based on the  following declared medications.  Unexpected results may arise from  inaccuracies in the declared medications.  **Note: The testing scope of this panel includes these medications:  Tramadol (Ultram)  **Note: The testing scope of this panel does not include the  following reported medications:  Albuterol  Albuterol (Duoneb)  Atenolol  Buspirone  Calcium  Dimenhydrinate  Eye Drop  Fluticasone (Advair)  Hydroxyzine (Vistaril)  Ipratropium (Duoneb)  Lisinopril (Zestril)  Lubiprostone (Amitiza)  Magnesium  Mirabegron (Myrbetriq)  Montelukast (Singulair)  Omeprazole (Prilosec)  Ondansetron (Zofran)  Paliperidone (Invega)  Polyethylene Glycol (MiraLAX)  Prazosin (Minipress)  Salmeterol (Advair)  Sertraline (Zoloft)  Supplement  Trazodone (Desyrel)  Vitamin D ==================================================================== For clinical consultation, please call [phone]. ====================================================================      ROS  Constitutional: Denies any fever or  chills Gastrointestinal: No reported hemesis, hematochezia, vomiting, or acute GI distress Musculoskeletal: Denies any acute onset joint swelling, redness, loss of ROM, or weakness Neurological: No reported episodes of acute onset apraxia, aphasia, dysarthria, agnosia, amnesia, paralysis, loss of coordination, or loss of consciousness  Medication Review  Benefiber, Fluticasone-Salmeterol, Magnesium, albuterol, atenolol, b complex vitamins, busPIRone, dimenhyDRINATE, hydrOXYzine, ipratropium-albuterol, latanoprost, lisinopril, lubiprostone, meloxicam, mirabegron ER, montelukast, omeprazole, ondansetron, paliperidone, polyethylene glycol, prazosin, sertraline, traMADol, and traZODone  History Review  Allergy: Ms. Dermody is allergic to latex, penicillins, codeine, cortisone, and sulfa antibiotics. Drug: Rachael West  reports no history of drug use. Alcohol:  reports no history of alcohol use. Tobacco:  reports that she quit smoking about 29 years ago. Her smoking use included cigarettes. She has a 30.00 pack-year smoking history. She has never used smokeless tobacco. Social: Rachael West  reports that she quit smoking about 29 years ago. Her smoking use included cigarettes. She has a 30.00 pack-year smoking history. She has never used smokeless tobacco. She reports that she does not drink alcohol and does not use drugs. Medical:  has a past medical history of Anemia, Anxiety, Arthritis, Asthma, Depression, GERD (gastroesophageal reflux disease), Hepatitis-C, History of chemotherapy, Hypertension, Insomnia, Lymphoma ([place]) (12/2013), Personal history of non-Hodgkin lymphomas, and Vitamin D deficiency. Surgical: Rachael West  has a past surgical history that includes Appendectomy; Trigger finger release; Esophagogastroduodenoscopy (01/17/2011); Colonoscopy (01/17/2011); Abdominal hysterectomy; and Breast biopsy (Left, 2015). Family: family history includes Allergies in her mother; Asthma  in her mother and sister; Breast cancer in her cousin; Colon cancer in her paternal aunt; Diabetes in her maternal grandfather and maternal grandmother; Heart disease in her brother, father, paternal grandfather, and paternal grandmother; Liver cancer in her paternal uncle; Ovarian cancer in an other family member; Pancreatic cancer in an other family member.  Laboratory Chemistry Profile   Renal Lab Results  Component Value Date   BUN 7 (L) 08/05/2019   CREATININE 0.84 08/05/2019   BCR 9 (L) 05/29/2018   GFRAA >60 08/05/2019   GFRNONAA >60 08/05/2019     Hepatic Lab Results  Component Value Date   AST 14 (L) 08/05/2019   ALT 12 08/05/2019   ALBUMIN 3.5 08/05/2019   ALKPHOS 80 08/05/2019   HCVAB REACTIVE (A) 01/12/2014   LIPASE 34 10/31/2010     Electrolytes Lab Results  Component Value Date   NA 142 08/05/2019   K 4.1 08/05/2019   CL 105 08/05/2019   CALCIUM 9.8 08/05/2019   MG 2.2 08/29/2017     Bone Lab Results  Component Value Date   25OHVITD1 21 (L) 08/29/2017   25OHVITD2 <1.0 08/29/2017   25OHVITD3 21 08/29/2017     Inflammation (CRP: Acute Phase) (ESR: Chronic Phase) Lab Results  Component Value Date   CRP 12 (H) 08/29/2017   ESRSEDRATE 46 (H) 08/29/2017       Note: Above Lab results reviewed.  Recent Imaging Review  DG PAIN CLINIC C-ARM 1-60 MIN NO REPORT Fluoro was used, but no Radiologist interpretation will be  provided.  Please refer to "NOTES" tab for provider progress note. Note: Reviewed        Physical Exam  General appearance: Well nourished, well developed, and well hydrated. In no apparent acute distress Mental status: Alert, oriented x 3 (person, place, & time)       Respiratory: No evidence of acute respiratory distress Eyes: PERLA Vitals: BP (!) 168/89 (BP Location: Left Arm, Patient Position: Sitting, Cuff Size: Normal)   Pulse 89   Temp 98.2 F (36.8 C) (Oral)   Resp 16   Ht 5' 6"  (1.676 m)   Wt 193 lb (87.5 kg)   SpO2 100%    BMI 31.15 kg/m  BMI: Estimated body mass index is 31.15 kg/m as calculated from the following:   Height as of this encounter: 5' 6"  (1.676 m).   Weight as of this encounter: 193 lb (87.5 kg). Ideal: Ideal body weight: 59.3 kg (130 lb 11.7 oz) Adjusted ideal body weight: 70.6 kg (155 lb 10.2 oz)  Assessment   Status Diagnosis  Controlled Controlled Controlled 1. Chronic pain syndrome   2. 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 Cervicalgia   4. Cervicogenic headache   5. DDD (degenerative disc disease), cervical   6. Cervical disc disorder at C5-C6 level with radiculopathy (Right)   7. Cervical radiculopathy at C6 (Right)   8. Radicular pain of upper extremity (Right)   9. Chronic upper extremity pain (Right)      Updated Problems: No problems updated.  Plan of Care  Problem-specific:  No problem-specific Assessment & Plan notes found for this encounter.  Rachael West has a current medication list which includes the following long-term medication(s): atenolol, fluticasone-salmeterol, ipratropium-albuterol, lisinopril, [START ON 11/05/2019] lubiprostone, [START ON 11/05/2019] magnesium, [START ON 11/05/2019] meloxicam, mirabegron er, montelukast, omeprazole, paliperidone, prazosin, proair hfa, sertraline, [START ON 11/05/2019] tramadol, trazodone, and [START ON 11/05/2019] benefiber.  Pharmacotherapy (Medications Ordered): Meds ordered this encounter  Medications  . traMADol (ULTRAM) 50 MG tablet    Sig: Take 2 tablets (100 mg total) by mouth every 6 (six) hours as needed for severe pain. Must last 30 days    Dispense:  240 tablet    Refill:  2    Chronic Pain: STOP Act (Not applicable) Fill 1 day early if closed on refill date. Avoid benzodiazepines within 8 hours of opioids   Orders:  Orders Placed This Encounter  Procedures  . Cervical Epidural Injection    Level(s): C7-T1 Laterality: Right-sided Purpose: Diagnostic Indication(s):  Radiculitis and cervicalgia associater with cervical degenerative disc disease.    Standing Status:   Future    Standing Expiration Date:   11/27/2019    Scheduling Instructions:     Procedure: Cervical Epidural Steroid Injection/Block     Sedation: With Sedation.     Timeframe: As soon as schedule allows    Order Specific Question:   Where will this procedure be performed?    Answer:   [facility]    Comments:   by Dr. Dossie Arbour  . MR CERVICAL SPINE WO CONTRAST    Patient presents with axial pain with possible radicular component.  In addition to any acute findings, please report on:  1. Facet (Zygapophyseal) joint DJD (Hypertrophy, space narrowing, subchondral sclerosis, and/or osteophyte formation) 2. DDD and/or IVDD (Loss of disc height, desiccation or "Black disc disease") 3. Pars defects 4. Spondylolisthesis, spondylosis, and/or spondyloarthropathies (include Degree/Grade of displacement in mm) 5. Vertebral body Fractures, including age (old,  new/acute) 6. Modic Type Changes 7. Demineralization 8. Bone pathology 9. Central, Lateral Recess, and/or Foraminal Stenosis (include AP diameter of stenosis in mm) 10. Surgical changes (hardware type, status, and presence of fibrosis) NOTE: Please specify level(s) and laterality.    Standing Status:   Future    Standing Expiration Date:   11/27/2019    Scheduling Instructions:     Imaging must be done as soon as possible. Inform patient that order will expire within 30 days and I will not renew it.    Order Specific Question:   What is the patient's sedation requirement?    Answer:   No Sedation    Order Specific Question:   Does the patient have a pacemaker or implanted devices?    Answer:   No    Order Specific Question:   Preferred imaging location?    Answer:   [facility] (table limit-350lbs)    Order Specific Question:   Call Results- Best Contact Number?    Answer:   [phone] ([facility])    Order  Specific Question:   Radiology Contrast Protocol - do NOT remove file path    Answer:   [email or web address]  . Ambulatory referral to Neurosurgery    Referral Priority:   Routine    Referral Type:   Surgical    Referral Reason:   Specialty Services Required    Requested Specialty:   Neurosurgery    Number of Visits Requested:   1  . NCV with EMG(electromyography)    Bilateral testing requested.    Standing Status:   Future    Standing Expiration Date:   10/26/2020    Scheduling Instructions:     Please refer this patient to [facility] for Nerve Conduction testing of the upper extremities. (EMG & PNCV)    Order Specific Question:   Where should this test be performed?    Answer:   Other   Follow-up plan:   Return for Procedure (w/ sedation): (R) Cervical ESI #1.      Interventional management options:  Considering:   NOTE: LATEX ALLERGY & Hx Hepatitis C (w/ cirrhosis) Diagnostic left LESI Possible bilateral lumbar facetRFA Diagnostic bilateral thoracic facet block Possiblebilateral thoracic facetRFA Possible bilateral cervical facetRFA Diagnostic bilateral GONB Diagnostic trigger point injections   Palliative PRN treatment(s):   Palliative/diagnosticbilateral lumbar facet block#4(100/100/90/90) Palliative/diagnostic bilateralcervicalfacet block#2    Recent Visits Date Type Provider Dept  09/03/19 Telemedicine Milinda Pointer, MD [facility]  08/14/19 Procedure visit Milinda Pointer, MD [facility]  08/07/19 Office Visit Milinda Pointer, MD [facility]  Showing recent visits within past 90 days and meeting all other requirements Today's Visits Date Type Provider Dept  11/04/19 Appointment Milinda Pointer, MD [facility]  Showing today's visits and meeting all other requirements Future Appointments No visits were found meeting these conditions. Showing future  appointments within next 90 days and meeting all other requirements  I discussed the assessment and treatment plan with the patient. The patient was provided an opportunity to ask questions and all were answered. The patient agreed with the plan and demonstrated an understanding of the instructions.  Patient advised to call back or seek an in-person evaluation if the symptoms or condition worsens.  Duration of encounter: 35 minutes.  Note by: Gaspar Cola, MD Date: 10/27/2019; Time: 7:41 AM

## 2019-10-27 ENCOUNTER — Other Ambulatory Visit: Payer: Self-pay

## 2019-10-27 ENCOUNTER — Ambulatory Visit: Payer: Medicare Other | Attending: Pain Medicine | Admitting: Pain Medicine

## 2019-10-27 ENCOUNTER — Encounter: Payer: Self-pay | Admitting: Pain Medicine

## 2019-10-27 ENCOUNTER — Ambulatory Visit: Payer: Medicare Other | Admitting: Physician Assistant

## 2019-10-27 ENCOUNTER — Other Ambulatory Visit: Payer: Self-pay | Admitting: Pain Medicine

## 2019-10-27 VITALS — BP 168/89 | HR 89 | Temp 98.2°F | Resp 16 | Ht 66.0 in | Wt 193.0 lb

## 2019-10-27 DIAGNOSIS — M5030 Other cervical disc degeneration, unspecified cervical region: Secondary | ICD-10-CM

## 2019-10-27 DIAGNOSIS — G894 Chronic pain syndrome: Secondary | ICD-10-CM | POA: Insufficient documentation

## 2019-10-27 DIAGNOSIS — M79601 Pain in right arm: Secondary | ICD-10-CM | POA: Diagnosis present

## 2019-10-27 DIAGNOSIS — M7918 Myalgia, other site: Secondary | ICD-10-CM

## 2019-10-27 DIAGNOSIS — K5903 Drug induced constipation: Secondary | ICD-10-CM

## 2019-10-27 DIAGNOSIS — E559 Vitamin D deficiency, unspecified: Secondary | ICD-10-CM

## 2019-10-27 DIAGNOSIS — M4802 Spinal stenosis, cervical region: Secondary | ICD-10-CM

## 2019-10-27 DIAGNOSIS — G8929 Other chronic pain: Secondary | ICD-10-CM | POA: Diagnosis present

## 2019-10-27 DIAGNOSIS — M542 Cervicalgia: Secondary | ICD-10-CM | POA: Diagnosis present

## 2019-10-27 DIAGNOSIS — M50122 Cervical disc disorder at C5-C6 level with radiculopathy: Secondary | ICD-10-CM | POA: Diagnosis present

## 2019-10-27 DIAGNOSIS — M792 Neuralgia and neuritis, unspecified: Secondary | ICD-10-CM | POA: Diagnosis present

## 2019-10-27 DIAGNOSIS — G4486 Cervicogenic headache: Secondary | ICD-10-CM | POA: Insufficient documentation

## 2019-10-27 DIAGNOSIS — Z79899 Other long term (current) drug therapy: Secondary | ICD-10-CM

## 2019-10-27 DIAGNOSIS — M1991 Primary osteoarthritis, unspecified site: Secondary | ICD-10-CM

## 2019-10-27 DIAGNOSIS — M5412 Radiculopathy, cervical region: Secondary | ICD-10-CM | POA: Insufficient documentation

## 2019-10-27 MED ORDER — TRAMADOL HCL 50 MG PO TABS: 100.0000 mg | ORAL_TABLET | Freq: Four times a day (QID) | ORAL | 2 refills | Status: DC | PRN

## 2019-10-27 MED ORDER — LUBIPROSTONE 24 MCG PO CAPS: 24.0000 ug | ORAL_CAPSULE | Freq: Two times a day (BID) | ORAL | 2 refills | Status: DC

## 2019-10-27 MED ORDER — MAGNESIUM 500 MG PO CAPS: 500.0000 mg | ORAL_CAPSULE | Freq: Two times a day (BID) | ORAL | 0 refills | Status: DC

## 2019-10-27 MED ORDER — BENEFIBER PO POWD: 6.0000 g | Freq: Three times a day (TID) | ORAL | 2 refills | Status: DC

## 2019-10-27 MED ORDER — MELOXICAM 15 MG PO TABS: 15.0000 mg | ORAL_TABLET | Freq: Every day | ORAL | 2 refills | Status: DC

## 2019-10-27 NOTE — Addendum Note (Signed)
Addended by: Milinda Pointer A on: 10/27/2019 11:45 AM   Modules accepted: Orders, Level of Service

## 2019-10-27 NOTE — Addendum Note (Signed)
Addended by: Milinda Pointer A on: 10/27/2019 11:23 AM   Modules accepted: Orders

## 2019-10-27 NOTE — Progress Notes (Signed)
No-show.  None opioids transferred 10/27/2019: Amitiza, magnesium, Mobic, and Benefiber.

## 2019-10-27 NOTE — Addendum Note (Signed)
Addended by: Milinda Pointer A on: 10/27/2019 12:46 PM   Modules accepted: Orders

## 2019-10-27 NOTE — Patient Instructions (Addendum)
____________________________________________________________________________________________  Preparing for Procedure with Sedation  Procedure appointments are limited to planned procedures: . No Prescription Refills. . No disability issues will be discussed. . No medication changes will be discussed.  Instructions: . Oral Intake: Do not eat or drink anything for at least 8 hours prior to your procedure. (Exception: Blood Pressure Medication. See below.) . Transportation: Unless otherwise stated by your physician, you may drive yourself after the procedure. . Blood Pressure Medicine: Do not forget to take your blood pressure medicine with a sip of water the morning of the procedure. If your Diastolic (lower reading)is above 100 mmHg, elective cases will be cancelled/rescheduled. . Blood thinners: These will need to be stopped for procedures. Notify our staff if you are taking any blood thinners. Depending on which one you take, there will be specific instructions on how and when to stop it. . Diabetics on insulin: Notify the staff so that you can be scheduled 1st case in the morning. If your diabetes requires high dose insulin, take only  of your normal insulin dose the morning of the procedure and notify the staff that you have done so. . Preventing infections: Shower with an antibacterial soap the morning of your procedure. . Build-up your immune system: Take 1000 mg of Vitamin C with every meal (3 times a day) the day prior to your procedure. . Antibiotics: Inform the staff if you have a condition or reason that requires you to take antibiotics before dental procedures. . Pregnancy: If you are pregnant, call and cancel the procedure. . Sickness: If you have a cold, fever, or any active infections, call and cancel the procedure. . Arrival: You must be in the facility at least 30 minutes prior to your scheduled procedure. . Children: Do not bring children with you. . Dress appropriately:  Bring dark clothing that you would not mind if they get stained. . Valuables: Do not bring any jewelry or valuables.  Reasons to call and reschedule or cancel your procedure: (Following these recommendations will minimize the risk of a serious complication.) . Surgeries: Avoid having procedures within 2 weeks of any surgery. (Avoid for 2 weeks before or after any surgery). . Flu Shots: Avoid having procedures within 2 weeks of a flu shots or . (Avoid for 2 weeks before or after immunizations). . Barium: Avoid having a procedure within 7-10 days after having had a radiological study involving the use of radiological contrast. (Myelograms, Barium swallow or enema study). . Heart attacks: Avoid any elective procedures or surgeries for the initial 6 months after a "Myocardial Infarction" (Heart Attack). . Blood thinners: It is imperative that you stop these medications before procedures. Let us know if you if you take any blood thinner.  . Infection: Avoid procedures during or within two weeks of an infection (including chest colds or gastrointestinal problems). Symptoms associated with infections include: Localized redness, fever, chills, night sweats or profuse sweating, burning sensation when voiding, cough, congestion, stuffiness, runny nose, sore throat, diarrhea, nausea, vomiting, cold or Flu symptoms, recent or current infections. It is specially important if the infection is over the area that we intend to treat. . Heart and lung problems: Symptoms that may suggest an active cardiopulmonary problem include: cough, chest pain, breathing difficulties or shortness of breath, dizziness, ankle swelling, uncontrolled high or unusually low blood pressure, and/or palpitations. If you are experiencing any of these symptoms, cancel your procedure and contact your primary care physician for an evaluation.  Remember:  Regular Business hours are:    Monday to Thursday 8:00 AM to 4:00 PM  Provider's  Schedule: Annikah Lovins, MD:  Procedure days: Tuesday and Thursday 7:30 AM to 4:00 PM  Bilal Lateef, MD:  Procedure days: Monday and Wednesday 7:30 AM to 4:00 PM ____________________________________________________________________________________________   ____________________________________________________________________________________________  General Risks and Possible Complications  Patient Responsibilities: It is important that you read this as it is part of your informed consent. It is our duty to inform you of the risks and possible complications associated with treatments offered to you. It is your responsibility as a patient to read this and to ask questions about anything that is not clear or that you believe was not covered in this document.  Patient's Rights: You have the right to refuse treatment. You also have the right to change your mind, even after initially having agreed to have the treatment done. However, under this last option, if you wait until the last second to change your mind, you may be charged for the materials used up to that point.  Introduction: Medicine is not an exact science. Everything in Medicine, including the lack of treatment(s), carries the potential for danger, harm, or loss (which is by definition: Risk). In Medicine, a complication is a secondary problem, condition, or disease that can aggravate an already existing one. All treatments carry the risk of possible complications. The fact that a side effects or complications occurs, does not imply that the treatment was conducted incorrectly. It must be clearly understood that these can happen even when everything is done following the highest safety standards.  No treatment: You can choose not to proceed with the proposed treatment alternative. The "PRO(s)" would include: avoiding the risk of complications associated with the therapy. The "CON(s)" would include: not getting any of the treatment  benefits. These benefits fall under one of three categories: diagnostic; therapeutic; and/or palliative. Diagnostic benefits include: getting information which can ultimately lead to improvement of the disease or symptom(s). Therapeutic benefits are those associated with the successful treatment of the disease. Finally, palliative benefits are those related to the decrease of the primary symptoms, without necessarily curing the condition (example: decreasing the pain from a flare-up of a chronic condition, such as incurable terminal cancer).  General Risks and Complications: These are associated to most interventional treatments. They can occur alone, or in combination. They fall under one of the following six (6) categories: no benefit or worsening of symptoms; bleeding; infection; nerve damage; allergic reactions; and/or death. 1. No benefits or worsening of symptoms: In Medicine there are no guarantees, only probabilities. No healthcare provider can ever guarantee that a medical treatment will work, they can only state the probability that it may. Furthermore, there is always the possibility that the condition may worsen, either directly, or indirectly, as a consequence of the treatment. 2. Bleeding: This is more common if the patient is taking a blood thinner, either prescription or over the counter (example: Goody Powders, Fish oil, Aspirin, Garlic, etc.), or if suffering a condition associated with impaired coagulation (example: Hemophilia, cirrhosis of the liver, low platelet counts, etc.). However, even if you do not have one on these, it can still happen. If you have any of these conditions, or take one of these drugs, make sure to notify your treating physician. 3. Infection: This is more common in patients with a compromised immune system, either due to disease (example: diabetes, cancer, human immunodeficiency virus [HIV], etc.), or due to medications or treatments (example: therapies used to treat  cancer and   rheumatological diseases). However, even if you do not have one on these, it can still happen. If you have any of these conditions, or take one of these drugs, make sure to notify your treating physician. 4. Nerve Damage: This is more common when the treatment is an invasive one, but it can also happen with the use of medications, such as those used in the treatment of cancer. The damage can occur to small secondary nerves, or to large primary ones, such as those in the spinal cord and brain. This damage may be temporary or permanent and it may lead to impairments that can range from temporary numbness to permanent paralysis and/or brain death. 5. Allergic Reactions: Any time a substance or material comes in contact with our body, there is the possibility of an allergic reaction. These can range from a mild skin rash (contact dermatitis) to a severe systemic reaction (anaphylactic reaction), which can result in death. 6. Death: In general, any medical intervention can result in death, most of the time due to an unforeseen complication. ____________________________________________________________________________________________  Epidural Steroid Injection Patient Information  Description: The epidural space surrounds the nerves as they exit the spinal cord.  In some patients, the nerves can be compressed and inflamed by a bulging disc or a tight spinal canal (spinal stenosis).  By injecting steroids into the epidural space, we can bring irritated nerves into direct contact with a potentially helpful medication.  These steroids act directly on the irritated nerves and can reduce swelling and inflammation which often leads to decreased pain.  Epidural steroids may be injected anywhere along the spine and from the neck to the low back depending upon the location of your pain.   After numbing the skin with local anesthetic (like Novocaine), a small needle is passed into the epidural space slowly.   You may experience a sensation of pressure while this is being done.  The entire block usually last less than 10 minutes.  Conditions which may be treated by epidural steroids:  Low back and leg pain Neck and arm pain Spinal stenosis Post-laminectomy syndrome Herpes zoster (shingles) pain Pain from compression fractures  Preparation for the injection:  Do not eat any solid food or dairy products within 8 hours of your appointment.  You may drink clear liquids up to 3 hours before appointment.  Clear liquids include water, black coffee, juice or soda.  No milk or cream please. You may take your regular medication, including pain medications, with a sip of water before your appointment  Diabetics should hold regular insulin (if taken separately) and take 1/2 normal NPH dos the morning of the procedure.  Carry some sugar containing items with you to your appointment. A driver must accompany you and be prepared to drive you home after your procedure.  Bring all your current medications with your. An IV may be inserted and sedation may be given at the discretion of the physician.   A blood pressure cuff, EKG and other monitors will often be applied during the procedure.  Some patients may need to have extra oxygen administered for a short period. You will be asked to provide medical information, including your allergies, prior to the procedure.  We must know immediately if you are taking blood thinners (like Coumadin/Warfarin)  Or if you are allergic to IV iodine contrast (dye). We must know if you could possible be pregnant.  Possible side-effects: Bleeding from needle site Infection (rare, may require surgery) Nerve injury (rare) Numbness & tingling (  temporary) Difficulty urinating (rare, temporary) Spinal headache ( a headache worse with upright posture) Light -headedness (temporary) Pain at injection site (several days) Decreased blood pressure (temporary) Weakness in arm/leg  (temporary) Pressure sensation in back/neck (temporary)  Call if you experience: Fever/chills associated with headache or increased back/neck pain. Headache worsened by an upright position. New onset weakness or numbness of an extremity below the injection site Hives or difficulty breathing (go to the emergency room) Inflammation or drainage at the infection site Severe back/neck pain Any new symptoms which are concerning to you  Please note:  Although the local anesthetic injected can often make your back or neck feel good for several hours after the injection, the pain will likely return.  It takes 3-7 days for steroids to work in the epidural space.  You may not notice any pain relief for at least that one week.  If effective, we will often do a series of three injections spaced 3-6 weeks apart to maximally decrease your pain.  After the initial series, we generally will wait several months before considering a repeat injection of the same type.  If you have any questions, please call [phone] [facility]

## 2019-10-27 NOTE — Progress Notes (Signed)
Nursing Pain Medication Assessment:  Safety precautions to be maintained throughout the outpatient stay will include: orient to surroundings, keep bed in low position, maintain call bell within reach at all times, provide assistance with transfer out of bed and ambulation.  Medication Inspection Compliance: Pill count conducted under aseptic conditions, in front of the patient. Neither the pills nor the bottle was removed from the patient's sight at any time. Once count was completed pills were immediately returned to the patient in their original bottle.  Medication: Tramadol (Ultram) Pill/Patch Count: 91 of 240 pills remain Pill/Patch Appearance: Markings consistent with prescribed medication Bottle Appearance: Standard pharmacy container. Clearly labeled. Filled Date: 08 / 17 / 2021 Last Medication intake:  Yesterday

## 2019-10-29 ENCOUNTER — Encounter: Payer: Medicare Other | Admitting: Pain Medicine

## 2019-11-04 ENCOUNTER — Ambulatory Visit: Payer: Medicare Other | Admitting: Pain Medicine

## 2019-11-04 NOTE — Progress Notes (Deleted)
No show

## 2019-11-12 ENCOUNTER — Ambulatory Visit
Admission: RE | Admit: 2019-11-12 | Discharge: 2019-11-12 | Disposition: A | Discharge status: Home / Self Care | Payer: Medicare Other | Source: Ambulatory Visit | Attending: Pain Medicine | Admitting: Pain Medicine

## 2019-11-12 ENCOUNTER — Other Ambulatory Visit: Payer: Self-pay

## 2019-11-12 DIAGNOSIS — G8929 Other chronic pain: Secondary | ICD-10-CM | POA: Diagnosis present

## 2019-11-12 DIAGNOSIS — M79601 Pain in right arm: Secondary | ICD-10-CM | POA: Diagnosis present

## 2019-11-12 DIAGNOSIS — M792 Neuralgia and neuritis, unspecified: Secondary | ICD-10-CM | POA: Insufficient documentation

## 2019-11-12 DIAGNOSIS — M5412 Radiculopathy, cervical region: Secondary | ICD-10-CM | POA: Diagnosis present

## 2019-11-12 DIAGNOSIS — M4802 Spinal stenosis, cervical region: Secondary | ICD-10-CM | POA: Insufficient documentation

## 2019-11-12 DIAGNOSIS — G4486 Cervicogenic headache: Secondary | ICD-10-CM | POA: Insufficient documentation

## 2019-11-12 DIAGNOSIS — M5030 Other cervical disc degeneration, unspecified cervical region: Secondary | ICD-10-CM | POA: Diagnosis present

## 2019-11-12 DIAGNOSIS — M542 Cervicalgia: Secondary | ICD-10-CM

## 2019-11-12 DIAGNOSIS — M50122 Cervical disc disorder at C5-C6 level with radiculopathy: Secondary | ICD-10-CM | POA: Insufficient documentation

## 2019-11-12 NOTE — Progress Notes (Signed)
PROVIDER NOTE: Information contained herein reflects review and annotations entered in association with encounter. Interpretation of such information and data should be left to medically-trained personnel. Information provided to patient can be located elsewhere in the medical record under "Patient Instructions". Document created using STT-dictation technology, any transcriptional errors that may result from process are unintentional.    Patient: Rachael West  Service Category: Procedure  Provider: Gaspar Cola, MD  DOB: 1955/12/11  DOS: 11/13/2019  Location: [facility]  MRN: [id number]  Setting: Ambulatory - outpatient  Referring Provider: Lorrene Reid, PA-C  Type: Established Patient  Specialty: Interventional Pain Management  PCP: Lorrene Reid, PA-C   Primary Reason for Visit: Interventional Pain Management Treatment. CC: Neck Pain (right side )  Procedure:          Anesthesia, Analgesia, Anxiolysis:  Type: Diagnostic, Inter-Laminar, Cervical Epidural Steroid Injection  #1  Region: Posterior Cervico-thoracic Region Level: C7-T1 Laterality: Right-Sided Paramedial  Type: Moderate (Conscious) Sedation combined with Local Anesthesia Indication(s): Analgesia and Anxiety Route: Intravenous (IV) IV Access: Secured Sedation: Meaningful verbal contact was maintained at all times during the procedure  Local Anesthetic: Lidocaine 1-2%  Position: Prone with head of the table was raised to facilitate breathing.   Indications: 1. Cervical disc disorder at C5-C6 level with radiculopathy (Right)   2. 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 Cervical radiculopathy at C6 (Right)   4. Cervicalgia   5. DDD (degenerative disc disease), cervical   6. Latex precautions, history of latex allergy    Pain Score: Pre-procedure: 6 /10 Post-procedure: 0-No pain/10   Today I had some communication with Dr. Cari Caraway who thinks that the patient may have  a contribution from the shoulder to her pain.  The main question is whether or not all the right shoulder pain is coming from her cervical radiculopathy or if there is a contribution from the shoulder joint itself.  I have scheduled the patient to return in 2 weeks for a diagnostic shoulder joint injection, which should block sensation from the shoulder and thereby provide [place] information as to whether or not the pain is radicular or articular, or if there is a combination of both.  Pre-op Assessment:  Rachael West is a 64 y.o. (year old), female patient, seen today for interventional treatment. She  has a past surgical history that includes Appendectomy; Trigger finger release; Esophagogastroduodenoscopy (01/17/2011); Colonoscopy (01/17/2011); Abdominal hysterectomy; and Breast biopsy (Left, 2015). [name] has a current medication list which includes the following prescription(s): atenolol, b complex vitamins, buspirone, dimenhydrinate, hydroxyzine, ipratropium-albuterol, latanoprost, lisinopril, lubiprostone, magnesium, meloxicam, mirabegron er, montelukast, omeprazole, ondansetron, paliperidone, polyethylene glycol, prazosin, proair hfa, sertraline, tramadol, trazodone, benefiber, and fluticasone-salmeterol, and the following Facility-Administered Medications: fentanyl, lactated ringers, and midazolam. Her primarily concern today is the Neck Pain (right side )  Initial Vital Signs:  Pulse/HCG Rate: 92ECG Heart Rate: 80 Temp: (!) 97.1 F (36.2 C) Resp: 16 BP: 138/86 SpO2: 100 %  BMI: Estimated body mass index is 31.15 kg/m as calculated from the following:   Height as of this encounter: 5\' 6"  (1.676 m).   Weight as of this encounter: 193 lb (87.5 kg).  Risk Assessment: Allergies: Reviewed. She is allergic to latex, penicillins, codeine, cortisone, and sulfa antibiotics.  Allergy Precautions: None required Coagulopathies: Reviewed. None identified.  Blood-thinner therapy: None at  this time Active Infection(s): Reviewed. None identified. [name] is afebrile  Site Confirmation: [name] was asked to confirm the procedure and laterality  before marking the site Procedure checklist: Completed Consent: Before the procedure and under the influence of no sedative(s), amnesic(s), or anxiolytics, the patient was informed of the treatment options, risks and possible complications. To fulfill our ethical and legal obligations, as recommended by the American Medical Association's Code of Ethics, I have informed the patient of my clinical impression; the nature and purpose of the treatment or procedure; the risks, benefits, and possible complications of the intervention; the alternatives, including doing nothing; the risk(s) and benefit(s) of the alternative treatment(s) or procedure(s); and the risk(s) and benefit(s) of doing nothing. The patient was provided information about the general risks and possible complications associated with the procedure. These may include, but are not limited to: failure to achieve desired goals, infection, bleeding, organ or nerve damage, allergic reactions, paralysis, and death. In addition, the patient was informed of those risks and complications associated to Spine-related procedures, such as failure to decrease pain; infection (i.e.: Meningitis, epidural or intraspinal abscess); bleeding (i.e.: epidural hematoma, subarachnoid hemorrhage, or any other type of intraspinal or peri-dural bleeding); organ or nerve damage (i.e.: Any type of peripheral nerve, nerve root, or spinal cord injury) with subsequent damage to sensory, motor, and/or autonomic systems, resulting in permanent pain, numbness, and/or weakness of one or several areas of the body; allergic reactions; (i.e.: anaphylactic reaction); and/or death. Furthermore, the patient was informed of those risks and complications associated with the medications. These include, but are not  limited to: allergic reactions (i.e.: anaphylactic or anaphylactoid reaction(s)); adrenal axis suppression; blood sugar elevation that in diabetics may result in ketoacidosis or comma; water retention that in patients with history of congestive heart failure may result in shortness of breath, pulmonary edema, and decompensation with resultant heart failure; weight gain; swelling or edema; medication-induced neural toxicity; particulate matter embolism and blood vessel occlusion with resultant organ, and/or nervous system infarction; and/or aseptic necrosis of one or more joints. Finally, the patient was informed that Medicine is not an exact science; therefore, there is also the possibility of unforeseen or unpredictable risks and/or possible complications that may result in a catastrophic outcome. The patient indicated having understood very clearly. We have given the patient no guarantees and we have made no promises. Enough time was given to the patient to ask questions, all of which were answered to the patient's satisfaction. Ms. Nghiem has indicated that she wanted to continue with the procedure. Attestation: I, the ordering provider, attest that I have discussed with the patient the benefits, risks, side-effects, alternatives, likelihood of achieving goals, and potential problems during recovery for the procedure that I have provided informed consent. Date  Time: 11/13/2019  9:26 AM  Pre-Procedure Preparation:  Monitoring: As per clinic protocol. Respiration, ETCO2, SpO2, BP, heart rate and rhythm monitor placed and checked for adequate function Safety Precautions: Patient was assessed for positional comfort and pressure points before starting the procedure. Time-out: I initiated and conducted the "Time-out" before starting the procedure, as per protocol. The patient was asked to participate by confirming the accuracy of the "Time Out" information. Verification of the correct person, site, and  procedure were performed and confirmed by me, the nursing staff, and the patient. "Time-out" conducted as per Joint Commission's Universal Protocol (UP.01.01.01). Time: 3976  Description of Procedure:          Target Area: For Epidural Steroid injections the target is the interlaminar space, initially targeting the lower border of the superior vertebral body lamina. Approach: Paramedial approach. Area Prepped: Entire PosteriorCervical Region  DuraPrep (Iodine Povacrylex [0.7% available iodine] and Isopropyl Alcohol, 74% w/w) Safety Precautions: Aspiration looking for blood return was conducted prior to all injections. At no point did we inject any substances, as a needle was being advanced. No attempts were made at seeking any paresthesias. Safe injection practices and needle disposal techniques used. Medications properly checked for expiration dates. SDV (single dose vial) medications used. Description of the Procedure: Protocol guidelines were followed. The procedure needle was introduced through the skin, ipsilateral to the reported pain, and advanced to the target area. Bone was contacted and the needle walked caudad, until the lamina was cleared. The epidural space was identified using "loss-of-resistance technique" with 2-3 ml of PF-NaCl (0.9% NSS), in a 5cc LOR glass syringe. Vitals:   11/13/19 0953 11/13/19 1003 11/13/19 1013 11/13/19 1023  BP: 116/68 133/76 128/79 128/80  Pulse:      Resp: 16 16 16 16   Temp:  (!) 97.3 F (36.3 C)    TempSrc:      SpO2: 98% 96% 100% 100%  Weight:      Height:        Start Time: 0947 hrs. End Time: 0953 hrs. Materials:  Needle(s) Type: Epidural needle Gauge: 17G Length: 3.5-in Medication(s): Please see orders for medications and dosing details.  Imaging Guidance (Spinal):          Type of Imaging Technique: Fluoroscopy Guidance (Spinal) Indication(s): Assistance in needle guidance and placement for procedures requiring needle placement in or  near specific anatomical locations not easily accessible without such assistance. Exposure Time: Please see nurses notes. Contrast: Before injecting any contrast, we confirmed that the patient did not have an allergy to iodine, shellfish, or radiological contrast. Once satisfactory needle placement was completed at the desired level, radiological contrast was injected. Contrast injected under live fluoroscopy. No contrast complications. See chart for type and volume of contrast used. Fluoroscopic Guidance: I was personally present during the use of fluoroscopy. "Tunnel Vision Technique" used to obtain the best possible view of the target area. Parallax error corrected before commencing the procedure. "Direction-depth-direction" technique used to introduce the needle under continuous pulsed fluoroscopy. Once target was reached, antero-posterior, oblique, and lateral fluoroscopic projection used confirm needle placement in all planes. Images permanently stored in EMR. Interpretation: I personally interpreted the imaging intraoperatively. Adequate needle placement confirmed in multiple planes. Appropriate spread of contrast into desired area was observed. No evidence of afferent or efferent intravascular uptake. No intrathecal or subarachnoid spread observed. Permanent images saved into the patient's record.  Antibiotic Prophylaxis:   Anti-infectives (From admission, onward)   None     Indication(s): None identified  Post-operative Assessment:  Post-procedure Vital Signs:  Pulse/HCG Rate: 9276 Temp: (!) 97.3 F (36.3 C) Resp: 16 BP: 128/80 SpO2: 100 %  EBL: None  Complications: No immediate post-treatment complications observed by team, or reported by patient.  Note: The patient tolerated the entire procedure well. A repeat set of vitals were taken after the procedure and the patient was kept under observation following institutional policy, for this type of procedure. Post-procedural  neurological assessment was performed, showing return to baseline, prior to discharge. The patient was provided with post-procedure discharge instructions, including a section on how to identify potential problems. Should any problems arise concerning this procedure, the patient was given instructions to immediately contact us, at any time, without hesitation. In any case, we plan to contact the patient by telephone for a follow-up status report regarding this interventional procedure.  Comments:  No  additional relevant information.  Plan of Care  Orders:  Orders Placed This Encounter  Procedures  . Cervical Epidural Injection    Procedure: Cervical Epidural Steroid Injection/Block Purpose: Diagnostic Indication(s): Radiculitis and cervicalgia associater with cervical degenerative disc disease.    Scheduling Instructions:     Level(s): C7-T1     Laterality: Right-sided     Sedation: Patient's choice.     Timeframe: Today    Order Specific Question:   Where will this procedure be performed?    Answer:   [facility]    Comments:   by Dr. Dossie Arbour  . SHOULDER INJECTION    Standing Status:   Future    Standing Expiration Date:   01/13/2020    Scheduling Instructions:     Procedure: Intra-articular shoulder (Glenohumeral) joint and (AC) Acromioclavicular joint injection     Side: Right-sided     Level: Glenohumeral joint and (AC) Acromioclavicular joint     Sedation: Patient's choice.     Timeframe: 2 weeks from now    Order Specific Question:   Where will this procedure be performed?    Answer:   [facility]  . DG PAIN CLINIC C-ARM 1-60 MIN NO REPORT    Intraoperative interpretation by procedural physician at [facility].    Standing Status:   Standing    Number of Occurrences:   1    Order Specific Question:   Reason for exam:    Answer:   Assistance in needle guidance and placement for procedures requiring needle placement in or near specific anatomical  locations not easily accessible without such assistance.  . DG Shoulder Right    Standing Status:   Future    Standing Expiration Date:   12/13/2019    Scheduling Instructions:     Imaging must be done as soon as possible. Inform patient that order will expire within 30 days and I will not renew it.    Order Specific Question:   Reason for Exam (SYMPTOM  OR DIAGNOSIS REQUIRED)    Answer:   Right shoulder pain    Order Specific Question:   Preferred imaging location?    Answer:   [facility]    Order Specific Question:   Call Results- Best Contact Number?    Answer:   [phone] ([facility])    Order Specific Question:   Release to patient    Answer:   Immediate  . Informed Consent Details: Physician/Practitioner Attestation; Transcribe to consent form and obtain patient signature    Nursing Order: Transcribe to consent form and obtain patient signature. Note: Always confirm laterality of pain with Ms. Hankins, before procedure.    Order Specific Question:   Physician/Practitioner attestation of informed consent for procedure/surgical case    Answer:   I, the physician/practitioner, attest that I have discussed with the patient the benefits, risks, side effects, alternatives, likelihood of achieving goals and potential problems during recovery for the procedure that I have provided informed consent.    Order Specific Question:   Procedure    Answer:   Cervical Epidural Steroid Injection (CESI) under fluoroscopic guidance    Order Specific Question:   Physician/Practitioner performing the procedure    Answer:   Corrado Hymon A. Dossie Arbour MD    Order Specific Question:   Indication/Reason    Answer:   Cervicalgia (Neck Pain) with or without Cervical Radiculopathy/Radiculitis (Arm/Shoulder Pain, Numbness, and/or weakness), secondary to Cervical and/or Cervicothoracic Degenerative Disc Disease (DDD), with or without Intervertebral  Disc Displacement (IVD  . Provide equipment /  supplies at bedside    "Epidural Tray" (Disposable  single use) Catheter: NOT required    Standing Status:   Standing    Number of Occurrences:   1    Order Specific Question:   Specify    Answer:   Epidural Tray  . Latex precautions    Activate Latex-Free Protocol.    Standing Status:   Standing    Number of Occurrences:   1   Chronic Opioid Analgesic:  Tramadol50 mg,2tabs (100 mg) PO q 6 hrs (400 mg/day of tramadol) MME/day:40mg /day.   Medications ordered for procedure: Meds ordered this encounter  Medications  . iohexol (OMNIPAQUE) 180 MG/ML injection 10 mL    Must be Myelogram-compatible. If not available, you may substitute with a water-soluble, non-ionic, hypoallergenic, myelogram-compatible radiological contrast medium.  Marland Kitchen lidocaine (XYLOCAINE) 2 % (with pres) injection 400 mg  . lactated ringers infusion 1,000 mL  . midazolam (VERSED) 5 MG/5ML injection 1-2 mg    Make sure Flumazenil is available in the pyxis when using this medication. If oversedation occurs, administer 0.2 mg IV over 15 sec. If after 45 sec no response, administer 0.2 mg again over 1 min; may repeat at 1 min intervals; not to exceed 4 doses (1 mg)  . fentaNYL (SUBLIMAZE) injection 25-50 mcg    Make sure Narcan is available in the pyxis when using this medication. In the event of respiratory depression (RR< 8/min): Titrate NARCAN (naloxone) in increments of 0.1 to 0.2 mg IV at 2-3 minute intervals, until desired degree of reversal.  . sodium chloride flush (NS) 0.9 % injection 1 mL  . ropivacaine (PF) 2 mg/mL (0.2%) (NAROPIN) injection 1 mL  . dexamethasone (DECADRON) injection 10 mg   Medications administered: We administered iohexol, lidocaine, midazolam, fentaNYL, sodium chloride flush, ropivacaine (PF) 2 mg/mL (0.2%), and dexamethasone.  See the medical record for exact dosing, route, and time of administration.  Follow-up plan:   Return in about 2 weeks (around 11/27/2019) for (F2F), (PP)  Follow-up, in addition, Procedure (w/ sedation): (R) Shoulder inj..       Interventional management options:  Considering:   NOTE: LATEX ALLERGY & Hx Hepatitis C (w/ cirrhosis) Diagnostic left LESI Possible bilateral lumbar facetRFA Diagnostic bilateral thoracic facet block Possiblebilateral thoracic facetRFA Possible bilateral cervical facetRFA Diagnostic bilateral GONB Diagnostic trigger point injections   Palliative PRN treatment(s):   Palliative/diagnosticbilateral lumbar facet block#4(100/100/90/90) Palliative/diagnostic bilateralcervicalfacet block#2     Recent Visits Date Type Provider Dept  09/03/19 Telemedicine Milinda Pointer, MD [facility]  Showing recent visits within past 90 days and meeting all other requirements Today's Visits Date Type Provider Dept  11/13/19 Procedure visit Milinda Pointer, MD [facility]  Showing today's visits and meeting all other requirements Future Appointments Date Type Provider Dept  11/27/19 Appointment Milinda Pointer, MD [facility]  Showing future appointments within next 90 days and meeting all other requirements  Disposition: Discharge home  Discharge (Date  Time): 11/13/2019; 1024 hrs.   Primary Care Physician: Lorrene Reid, PA-C Location: [facility] Note by: Gaspar Cola, MD Date: 11/13/2019; Time: 10:57 AM  Disclaimer:  Medicine is not an exact science. The only guarantee in medicine is that nothing is guaranteed. It is important to note that the decision to proceed with this intervention was based on the information collected from the patient. The Data and conclusions were drawn from the patient's questionnaire, the interview, and the  physical examination. Because the information was provided in large part by the patient, it cannot be guaranteed that it has not been purposely or unconsciously manipulated. Every effort has been made to  obtain as much relevant data as possible for this evaluation. It is important to note that the conclusions that lead to this procedure are derived in large part from the available data. Always take into account that the treatment will also be dependent on availability of resources and existing treatment guidelines, considered by other Pain Management Practitioners as being common knowledge and practice, at the time of the intervention. For Medico-Legal purposes, it is also important to point out that variation in procedural techniques and pharmacological choices are the acceptable norm. The indications, contraindications, technique, and results of the above procedure should only be interpreted and judged by a Board-Certified Interventional Pain Specialist with extensive familiarity and expertise in the same exact procedure and technique.

## 2019-11-12 NOTE — Patient Instructions (Addendum)
____________________________________________________________________________________________  Post-Procedure Discharge Instructions  Instructions:  Apply ice:   Purpose: This will minimize any swelling and discomfort after procedure.   When: Day of procedure, as soon as you get home.  How: Fill a plastic sandwich bag with crushed ice. Cover it with a small towel and apply to injection site.  How long: (15 min on, 15 min off) Apply for 15 minutes then remove x 15 minutes.  Repeat sequence on day of procedure, until you go to bed.  Apply heat:   Purpose: To treat any soreness and discomfort from the procedure.  When: Starting the next day after the procedure.  How: Apply heat to procedure site starting the day following the procedure.  How long: May continue to repeat daily, until discomfort goes away.  Food intake: Start with clear liquids (like water) and advance to regular food, as tolerated.   Physical activities: Keep activities to a minimum for the first 8 hours after the procedure. After that, then as tolerated.  Driving: If you have received any sedation, be responsible and do not drive. You are not allowed to drive for 24 hours after having sedation.  Blood thinner: (Applies only to those taking blood thinners) You may restart your blood thinner 6 hours after your procedure.  Insulin: (Applies only to Diabetic patients taking insulin) As soon as you can eat, you may resume your normal dosing schedule.  Infection prevention: Keep procedure site clean and dry. Shower daily and clean area with soap and water.  Post-procedure Pain Diary: Extremely important that this be done correctly and accurately. Recorded information will be used to determine the next step in treatment. For the purpose of accuracy, follow these rules:  Evaluate only the area treated. Do not report or include pain from an untreated area. For the purpose of this evaluation, ignore all other areas of pain,  except for the treated area.  After your procedure, avoid taking a long nap and attempting to complete the pain diary after you wake up. Instead, set your alarm clock to go off every hour, on the hour, for the initial 8 hours after the procedure. Document the duration of the numbing medicine, and the relief you are getting from it.  Do not go to sleep and attempt to complete it later. It will not be accurate. If you received sedation, it is likely that you were given a medication that may cause amnesia. Because of this, completing the diary at a later time may cause the information to be inaccurate. This information is needed to plan your care.  Follow-up appointment: Keep your post-procedure follow-up evaluation appointment after the procedure (usually 2 weeks for most procedures, 6 weeks for radiofrequencies). DO NOT FORGET to bring you pain diary with you.   Expect: (What should I expect to see with my procedure?)  From numbing medicine (AKA: Local Anesthetics): Numbness or decrease in pain. You may also experience some weakness, which if present, could last for the duration of the local anesthetic.  Onset: Full effect within 15 minutes of injected.  Duration: It will depend on the type of local anesthetic used. On the average, 1 to 8 hours.   From steroids (Applies only if steroids were used): Decrease in swelling or inflammation. Once inflammation is improved, relief of the pain will follow.  Onset of benefits: Depends on the amount of swelling present. The more swelling, the longer it will take for the benefits to be seen. In some cases, up to 10 days.    Duration: Steroids will stay in the system x 2 weeks. Duration of benefits will depend on multiple posibilities including persistent irritating factors.  Side-effects: If present, they may typically last 2 weeks (the duration of the steroids).  Frequent: Cramps (if they occur, drink Gatorade and take over-the-counter Magnesium 450-500 mg  once to twice a day); water retention with temporary weight gain; increases in blood sugar; decreased immune system response; increased appetite.  Occasional: Facial flushing (red, warm cheeks); mood swings; menstrual changes.  Uncommon: Long-term decrease or suppression of natural hormones; bone thinning. (These are more common with higher doses or more frequent use. This is why we prefer that our patients avoid having any injection therapies in other practices.)   Very Rare: Severe mood changes; psychosis; aseptic necrosis.  From procedure: Some discomfort is to be expected once the numbing medicine wears off. This should be minimal if ice and heat are applied as instructed.  Call if: (When should I call?)  You experience numbness and weakness that gets worse with time, as opposed to wearing off.  New onset bowel or bladder incontinence. (Applies only to procedures done in the spine)  Emergency Numbers:  Durning business hours (Monday - Thursday, 8:00 AM - 4:00 PM) (Friday, 9:00 AM - 12:00 Noon): [phone]  After hours: [phone]  NOTE: If you are having a problem and are unable connect with, or to talk to a provider, then go to your nearest urgent care or emergency department. If the problem is serious and urgent, please call 911. ____________________________________________________________________________________________   ____________________________________________________________________________________________  Preparing for Procedure with Sedation  Procedure appointments are limited to planned procedures: . No Prescription Refills. . No disability issues will be discussed. . No medication changes will be discussed.  Instructions: . Oral Intake: Do not eat or drink anything for at least 8 hours prior to your procedure. (Exception: Blood Pressure Medication. See below.) . Transportation: Unless otherwise stated by your physician, you may drive yourself after the  procedure. . Blood Pressure Medicine: Do not forget to take your blood pressure medicine with a sip of water the morning of the procedure. If your Diastolic (lower reading)is above 100 mmHg, elective cases will be cancelled/rescheduled. . Blood thinners: These will need to be stopped for procedures. Notify our staff if you are taking any blood thinners. Depending on which one you take, there will be specific instructions on how and when to stop it. . Diabetics on insulin: Notify the staff so that you can be scheduled 1st case in the morning. If your diabetes requires high dose insulin, take only  of your normal insulin dose the morning of the procedure and notify the staff that you have done so. . Preventing infections: Shower with an antibacterial soap the morning of your procedure. . Build-up your immune system: Take 1000 mg of Vitamin C with every meal (3 times a day) the day prior to your procedure. . Antibiotics: Inform the staff if you have a condition or reason that requires you to take antibiotics before dental procedures. . Pregnancy: If you are pregnant, call and cancel the procedure. . Sickness: If you have a cold, fever, or any active infections, call and cancel the procedure. . Arrival: You must be in the facility at least 30 minutes prior to your scheduled procedure. . Children: Do not bring children with you. . Dress appropriately: Bring dark clothing that you would not mind if they get stained. . Valuables: Do not bring any jewelry or valuables.    Reasons to call and reschedule or cancel your procedure: (Following these recommendations will minimize the risk of a serious complication.) . Surgeries: Avoid having procedures within 2 weeks of any surgery. (Avoid for 2 weeks before or after any surgery). . Flu Shots: Avoid having procedures within 2 weeks of a flu shots or . (Avoid for 2 weeks before or after immunizations). . Barium: Avoid having a procedure within 7-10 days after  having had a radiological study involving the use of radiological contrast. (Myelograms, Barium swallow or enema study). . Heart attacks: Avoid any elective procedures or surgeries for the initial 6 months after a "Myocardial Infarction" (Heart Attack). . Blood thinners: It is imperative that you stop these medications before procedures. Let us know if you if you take any blood thinner.  . Infection: Avoid procedures during or within two weeks of an infection (including chest colds or gastrointestinal problems). Symptoms associated with infections include: Localized redness, fever, chills, night sweats or profuse sweating, burning sensation when voiding, cough, congestion, stuffiness, runny nose, sore throat, diarrhea, nausea, vomiting, cold or Flu symptoms, recent or current infections. It is specially important if the infection is over the area that we intend to treat. Marland Kitchen Heart and lung problems: Symptoms that may suggest an active cardiopulmonary problem include: cough, chest pain, breathing difficulties or shortness of breath, dizziness, ankle swelling, uncontrolled high or unusually low blood pressure, and/or palpitations. If you are experiencing any of these symptoms, cancel your procedure and contact your primary care physician for an evaluation.  Remember:  Regular Business hours are:  Monday to Thursday 8:00 AM to 4:00 PM  Provider's Schedule: Milinda Pointer, MD:  Procedure days: Tuesday and Thursday 7:30 AM to 4:00 PM  Gillis Santa, MD:  Procedure days: Monday and Wednesday 7:30 AM to 4:00 PM ____________________________________________________________________________________________   Post-procedure Information What to expect: Most procedures involve the use of a local anesthetic (numbing medicine), and a steroid (anti-inflammatory medicine).  The local anesthetics may cause temporary numbness and weakness of the legs or arms, depending on the location of the block. This  numbness/weakness may last 4-6 hours, depending on the local anesthetic used. In rare instances, it can last up to 24 hours. While numb, you must be very careful not to injure the extremity.  After any procedure, you could expect the pain to get better within 15-20 minutes. This relief is temporary and may last 4-6 hours. Once the local anesthetics wears off, you could experience discomfort, possibly more than usual, for up to 10 (ten) days. In the case of radiofrequencies, it may last up to 6 weeks. Surgeries may take up to 8 weeks for the healing process. The discomfort is due to the irritation caused by needles going through skin and muscle. To minimize the discomfort, we recommend using ice the first day, and heat from then on. The ice should be applied for 15 minutes on, and 15 minutes off. Keep repeating this cycle until bedtime. Avoid applying the ice directly to the skin, to prevent frostbite. Heat should be used daily, until the pain improves (4-10 days). Be careful not to burn yourself.  Occasionally you may experience muscle spasms or cramps. These occur as a consequence of the irritation caused by the needle sticks to the muscle and the blood that will inevitably be lost into the surrounding muscle tissue. Blood tends to be very irritating to tissues, which tend to react by going into spasm. These spasms may start the same day of your procedure, but  they may also take days to develop. This late onset type of spasm or cramp is usually caused by electrolyte imbalances triggered by the steroids, at the level of the kidney. Cramps and spasms tend to respond well to muscle relaxants, multivitamins (some are triggered by the procedure, but may have their origins in vitamin deficiencies), and "Gatorade", or any sports drinks that can replenish any electrolyte imbalances. (If you are a diabetic, ask your pharmacist to get you a sugar-free brand.) Warm showers or baths may also be helpful. Stretching  exercises are highly recommended. General Instructions:  Be alert for signs of possible infection: redness, swelling, heat, red streaks, elevated temperature, and/or fever. These typically appear 4 to 6 days after the procedure. Immediately notify your doctor if you experience unusual bleeding, difficulty breathing, or loss of bowel or bladder control. If you experience increased pain, do not increase your pain medicine intake, unless instructed by your pain physician. Post-Procedure Care:  Be careful in moving about. Muscle spasms in the area of the injection may occur. Applying ice or heat to the area is often helpful. The incidence of spinal headaches after epidural injections ranges between 1.4% and 6%. If you develop a headache that does not seem to respond to conservative therapy, please let your physician know. This can be treated with an epidural blood patch.   Post-procedure numbness or redness is to be expected, however it should average 4 to 6 hours. If numbness and weakness of your extremities begins to develop 4 to 6 hours after your procedure, and is felt to be progressing and worsening, immediately contact your physician.   Diet:  If you experience nausea, do not eat until this sensation goes away. If you had a "Stellate Ganglion Block" for upper extremity "Reflex Sympathetic Dystrophy", do not eat or drink until your hoarseness goes away. In any case, always start with liquids first and if you tolerate them well, then slowly progress to more solid foods. Activity:  For the first 4 to 6 hours after the procedure, use caution in moving about as you may experience numbness and/or weakness. Use caution in cooking, using household electrical appliances, and climbing steps. If you need to reach your Doctor call our office: [phone]) [phone] Monday-Thursday 8:00 am - 4:00 PM    Fridays: Closed     In case of an emergency: In case of emergency, call 911 or go to the nearest emergency room and  have the physician there call us.  Interpretation of Procedure Every nerve block has two components: a diagnostic component, and a treatment component. Unrealistic expectations are the most common causes of "perceived failure".  In a perfect world, a single nerve block should be able to completely and permanently eliminate the pain. Sadly, the world is not perfect.  Most pain management nerve blocks are performed using local anesthetics and steroids. Steroids are responsible for any long-term benefit that you may experience. Their purpose is to decrease any chronic swelling that may exist in the area. Steroids begin to work immediately after being injected. However, most patients will not experience any benefits until 5 to 10 days after the injection, when the swelling has come down to the point where they can tell a difference. Steroids will only help if there is swelling to be treated. As such, they can assist with the diagnosis. If effective, they suggest an inflammatory component to the pain, and if ineffective, they rule out inflammation as the main cause or component of the problem. If  the problem is one of mechanical compression, you will get no benefit from those steroids.   In the case of local anesthetics, they have a crucial role in the diagnosis of your condition. Most will begin to work within15 to 20 minutes after injection. The duration will depend on the type used (short- vs. Long-acting). It is of outmost importance that patients keep tract of their pain, after the procedure. To assist with this matter, a "Post-procedure Pain Diary" is provided. Make sure to complete it and to bring it back to your follow-up appointment.  As long as the patient keeps accurate, detailed records of their symptoms after every procedure, and returns to have those interpreted, every procedure will provide [place] with invaluable information. Even a block that does not provide the patient with any relief, will always  provide [place] with information about the mechanism and the origin of the pain. The only time a nerve block can be considered a waste of time is when patients do not keep track of the results, or do not keep their post-procedure appointment.  Reporting the results back to your physician The Pain Score  Pain is a subjective complaint. It cannot be seen, touched, or measured. We depend entirely on the patient's report of the pain in order to assess your condition and treatment. To evaluate the pain, we use a pain scale, where "0" means "No Pain", and a "10" is "the worst possible pain that you can even imagine" (i.e. something like been eaten alive by a shark or being torn apart by a lion).   You will frequently be asked to rate your pain. Please be as accurate, remember that medical decisions will be based on your responses. Please do not rate your pain above a 10. Doing so is actually interpreted as "symptom magnification" (exaggeration), as well as lack of understanding with regards to the scale. To put this into perspective, when you tell us that your pain is at a 10 (ten), what you are saying is that there is nothing we can do to make this pain any worse. (Carefully think about that.)

## 2019-11-13 ENCOUNTER — Ambulatory Visit
Admission: RE | Admit: 2019-11-13 | Discharge: 2019-11-13 | Disposition: A | Discharge status: Home / Self Care | Payer: Medicare Other | Source: Ambulatory Visit | Attending: Pain Medicine | Admitting: Pain Medicine

## 2019-11-13 ENCOUNTER — Ambulatory Visit (HOSPITAL_BASED_OUTPATIENT_CLINIC_OR_DEPARTMENT_OTHER): Payer: Medicare Other | Admitting: Pain Medicine

## 2019-11-13 ENCOUNTER — Other Ambulatory Visit: Payer: Self-pay

## 2019-11-13 ENCOUNTER — Encounter: Payer: Self-pay | Admitting: Pain Medicine

## 2019-11-13 VITALS — BP 128/80 | HR 92 | Temp 97.3°F | Resp 16 | Ht 66.0 in | Wt 193.0 lb

## 2019-11-13 DIAGNOSIS — M4802 Spinal stenosis, cervical region: Secondary | ICD-10-CM

## 2019-11-13 DIAGNOSIS — M25511 Pain in right shoulder: Secondary | ICD-10-CM | POA: Insufficient documentation

## 2019-11-13 DIAGNOSIS — M542 Cervicalgia: Secondary | ICD-10-CM

## 2019-11-13 DIAGNOSIS — Z91040 Latex allergy status: Secondary | ICD-10-CM | POA: Insufficient documentation

## 2019-11-13 DIAGNOSIS — M50122 Cervical disc disorder at C5-C6 level with radiculopathy: Secondary | ICD-10-CM | POA: Diagnosis not present

## 2019-11-13 DIAGNOSIS — M5412 Radiculopathy, cervical region: Secondary | ICD-10-CM

## 2019-11-13 DIAGNOSIS — G8929 Other chronic pain: Secondary | ICD-10-CM

## 2019-11-13 DIAGNOSIS — M25512 Pain in left shoulder: Secondary | ICD-10-CM | POA: Insufficient documentation

## 2019-11-13 DIAGNOSIS — M5030 Other cervical disc degeneration, unspecified cervical region: Secondary | ICD-10-CM | POA: Insufficient documentation

## 2019-11-13 MED ORDER — ROPIVACAINE HCL 2 MG/ML IJ SOLN
INTRAMUSCULAR | Status: AC
  Filled 2019-11-13: qty 10

## 2019-11-13 MED ORDER — MIDAZOLAM HCL 5 MG/5ML IJ SOLN
INTRAMUSCULAR | Status: AC
  Filled 2019-11-13: qty 5

## 2019-11-13 MED ORDER — LIDOCAINE HCL 2 % IJ SOLN
INTRAMUSCULAR | Status: AC
  Filled 2019-11-13: qty 20

## 2019-11-13 MED ORDER — DEXAMETHASONE SODIUM PHOSPHATE 10 MG/ML IJ SOLN
INTRAMUSCULAR | Status: AC
  Filled 2019-11-13: qty 1

## 2019-11-13 MED ORDER — DEXAMETHASONE SODIUM PHOSPHATE 10 MG/ML IJ SOLN
10.0000 mg | Freq: Once | INTRAMUSCULAR | Status: AC
  Administered 2019-11-13: 10 mg

## 2019-11-13 MED ORDER — ROPIVACAINE HCL 2 MG/ML IJ SOLN
1.0000 mL | Freq: Once | INTRAMUSCULAR | Status: AC
  Administered 2019-11-13: 10 mL via EPIDURAL

## 2019-11-13 MED ORDER — IOHEXOL 180 MG/ML  SOLN
INTRAMUSCULAR | Status: AC
  Filled 2019-11-13: qty 20

## 2019-11-13 MED ORDER — FENTANYL CITRATE (PF) 100 MCG/2ML IJ SOLN
INTRAMUSCULAR | Status: AC
  Filled 2019-11-13: qty 2

## 2019-11-13 MED ORDER — IOHEXOL 180 MG/ML  SOLN
10.0000 mL | Freq: Once | INTRAMUSCULAR | Status: AC
  Administered 2019-11-13: 10 mL via EPIDURAL

## 2019-11-13 MED ORDER — SODIUM CHLORIDE 0.9% FLUSH
1.0000 mL | Freq: Once | INTRAVENOUS | Status: AC
  Administered 2019-11-13: 10 mL

## 2019-11-13 MED ORDER — SODIUM CHLORIDE (PF) 0.9 % IJ SOLN
INTRAMUSCULAR | Status: AC
  Filled 2019-11-13: qty 10

## 2019-11-13 MED ORDER — MIDAZOLAM HCL 5 MG/5ML IJ SOLN
1.0000 mg | INTRAMUSCULAR | Status: DC | PRN
  Administered 2019-11-13: 1 mg via INTRAVENOUS

## 2019-11-13 MED ORDER — LIDOCAINE HCL 2 % IJ SOLN
20.0000 mL | Freq: Once | INTRAMUSCULAR | Status: AC
  Administered 2019-11-13: 400 mg

## 2019-11-13 MED ORDER — LACTATED RINGERS IV SOLN: 1000.0000 mL | Freq: Once | INTRAVENOUS | Status: DC

## 2019-11-13 MED ORDER — FENTANYL CITRATE (PF) 100 MCG/2ML IJ SOLN
25.0000 ug | INTRAMUSCULAR | Status: DC | PRN
  Administered 2019-11-13: 50 ug via INTRAVENOUS

## 2019-11-14 ENCOUNTER — Telehealth: Payer: Self-pay

## 2019-11-14 NOTE — Telephone Encounter (Signed)
calledPP and states that things are going well for her this am. Instructed to call if needed.

## 2019-11-17 ENCOUNTER — Ambulatory Visit (INDEPENDENT_AMBULATORY_CARE_PROVIDER_SITE_OTHER): Payer: Medicare Other | Admitting: Physician Assistant

## 2019-11-17 ENCOUNTER — Encounter: Payer: Self-pay | Admitting: Physician Assistant

## 2019-11-17 ENCOUNTER — Other Ambulatory Visit: Payer: Self-pay

## 2019-11-17 VITALS — BP 121/67 | HR 84 | Ht 64.0 in | Wt 192.6 lb

## 2019-11-17 DIAGNOSIS — F39 Unspecified mood [affective] disorder: Secondary | ICD-10-CM

## 2019-11-17 DIAGNOSIS — J4520 Mild intermittent asthma, uncomplicated: Secondary | ICD-10-CM

## 2019-11-17 DIAGNOSIS — Z23 Encounter for immunization: Secondary | ICD-10-CM

## 2019-11-17 DIAGNOSIS — E785 Hyperlipidemia, unspecified: Secondary | ICD-10-CM | POA: Diagnosis not present

## 2019-11-17 DIAGNOSIS — I10 Essential (primary) hypertension: Secondary | ICD-10-CM | POA: Diagnosis not present

## 2019-11-17 DIAGNOSIS — J302 Other seasonal allergic rhinitis: Secondary | ICD-10-CM

## 2019-11-17 MED ORDER — ALBUTEROL SULFATE HFA 108 (90 BASE) MCG/ACT IN AERS: INHALATION_SPRAY | RESPIRATORY_TRACT | 2 refills | Status: DC

## 2019-11-17 MED ORDER — MONTELUKAST SODIUM 10 MG PO TABS: 10.0000 mg | ORAL_TABLET | Freq: Every day | ORAL | 2 refills | Status: DC

## 2019-11-17 MED ORDER — LORATADINE 10 MG PO TABS: 10.0000 mg | ORAL_TABLET | Freq: Every day | ORAL | 0 refills | Status: DC

## 2019-11-17 MED ORDER — LISINOPRIL 5 MG PO TABS: 5.0000 mg | ORAL_TABLET | Freq: Every day | ORAL | 0 refills | Status: DC

## 2019-11-17 MED ORDER — IPRATROPIUM-ALBUTEROL 0.5-2.5 (3) MG/3ML IN SOLN: RESPIRATORY_TRACT | 2 refills | Status: DC

## 2019-11-17 NOTE — Progress Notes (Signed)
Established Patient Office Visit  Subjective:  Patient ID: Rachael West, female    DOB: 1955-11-08  Age: 64 y.o. MRN: [id number]  CC:  Chief Complaint  Patient presents with   Hypertension   Hyperlipidemia   Anxiety   Depression    HPI [name] presents for follow up on hypertension, mood, and hyperlipidemia. Requesting medication refills.  Has complaints of allergy symptoms-runny nose and watery eyes.  Denies cough, fever, or chills. Has been taking Singulair. Reports is using Advair inhaler once daily instead of twice daily.   HTN: Pt denies chest pain, palpitations, dizziness or lower extremity swelling. Taking medication as directed without side effects.  Does not check blood pressure at home.  Pt follows a low salt diet.  HLD: Trying to manage with diet. Limits fried foods and red meat. Diet consist of chicken and vegetables.  Mood: Continues to follow-up with [facility].  Sees both a therapist and psychiatrist. Denies SI/HI or mood changes.   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   Personal history of non-Hodgkin lymphomas    Vitamin D deficiency     Past Surgical History:  Procedure Laterality Date   ABDOMINAL HYSTERECTOMY     APPENDECTOMY     BREAST BIOPSY Left 2015   lymphoma   COLONOSCOPY  01/17/2011   Procedure: COLONOSCOPY;  Surgeon: Winfield Cunas., MD;  Location: [facility];  Service: Endoscopy;  Laterality: N/A;   ESOPHAGOGASTRODUODENOSCOPY  01/17/2011   Procedure: ESOPHAGOGASTRODUODENOSCOPY (EGD);  Surgeon: Winfield Cunas., MD;  Location: [facility];  Service: Endoscopy;  Laterality: N/A;   TRIGGER FINGER RELEASE      Family History  Problem Relation Age of Onset   Asthma Mother     Allergies Mother    Asthma Sister    Heart disease Father    Heart disease Brother    Diabetes Maternal Grandmother    Diabetes Maternal Grandfather    Heart disease Paternal Grandmother    Heart disease Paternal Grandfather    Colon cancer Paternal Aunt    Liver cancer Paternal Uncle    Breast cancer Cousin    Ovarian cancer Other        great aunt (m)   Pancreatic cancer Other        great aunt (m)   Stomach cancer Neg Hx    Rectal cancer Neg Hx    Esophageal cancer Neg Hx     Social History   Socioeconomic History   Marital status: Divorced    Spouse name: Not on file   Number of children: Not on file   Years of education: Not on file   Highest education level: Not on file  Occupational History   Occupation: Disabled  Tobacco Use   Smoking status: Former Smoker    Packs/day: 1.50    Years: 20.00    Pack years: 30.00    Types: Cigarettes    Quit date: 01/09/1990    Years since quitting: 29.8   Smokeless tobacco: Never Used  Vaping Use   Vaping Use: Never used  Substance and Sexual Activity   Alcohol use: No    Alcohol/week: 0.0 standard drinks  Drug use: No   Sexual activity: Not Currently  Other Topics Concern   Not on file  Social History Narrative   Not on file   Social Determinants of Health   Financial Resource Strain:    Difficulty of Paying Living Expenses: Not on file  Food Insecurity:    Worried About Running Out of Food in the Last Year: Not on file   Ran Out of Food in the Last Year: Not on file  Transportation Needs:    Lack of Transportation (Medical): Not on file   Lack of Transportation (Non-Medical): Not on file  Physical Activity:    Days of Exercise per Week: Not on file   Minutes of Exercise per Session: Not on file  Stress:    Feeling of Stress : Not on file  Social Connections:    Frequency of Communication with Friends and Family: Not on file   Frequency of Social Gatherings with  Friends and Family: Not on file   Attends Religious Services: Not on file   Active Member of Clubs or Organizations: Not on file   Attends Banker Meetings: Not on file   Marital Status: Not on file  Intimate Partner Violence:    Fear of Current or Ex-Partner: Not on file   Emotionally Abused: Not on file   Physically Abused: Not on file   Sexually Abused: Not on file    Outpatient Medications Prior to Visit  Medication Sig Dispense Refill   atenolol (TENORMIN) 25 MG tablet TAKE 1 TABLET EVERY DAY 90 tablet 0   b complex vitamins tablet Take 1 tablet by mouth daily.     busPIRone (BUSPAR) 5 MG tablet Take 1 tablet (5 mg total) by mouth 2 (two) times daily. (Patient taking differently: Take 5 mg by mouth 3 (three) times daily. )     dimenhyDRINATE (DRAMAMINE) 50 MG tablet Take 50 mg by mouth every 8 (eight) hours as needed.     Fluticasone-Salmeterol (ADVAIR DISKUS) 250-50 MCG/DOSE AEPB Inhale 1 puff into the lungs 2 (two) times daily. 1 each 2   hydrOXYzine (VISTARIL) 50 MG capsule Take 50 mg by mouth at bedtime.     latanoprost (XALATAN) 0.005 % ophthalmic solution Place 1 drop into both eyes daily.     lubiprostone (AMITIZA) 24 MCG capsule Take 1 capsule (24 mcg total) by mouth 2 (two) times daily with a meal. Swallow the medication whole. Do not break or chew the medication. 60 capsule 2   Magnesium 500 MG CAPS Take 1 capsule (500 mg total) by mouth 2 (two) times daily at 8 am and 10 pm. 180 capsule 0   meloxicam (MOBIC) 15 MG tablet Take 1 tablet (15 mg total) by mouth daily. 30 tablet 2   mirabegron ER (MYRBETRIQ) 25 MG TB24 tablet Take 1 tablet (25 mg total) by mouth daily. Needs to come from the Urologist- no further RFs will be provided 30 tablet 0   omeprazole (PRILOSEC) 40 MG capsule 1 po qd Needs a follow up for additional refills 10-02-2019 90 capsule 0   ondansetron (ZOFRAN) 4 MG tablet TAKE 1 TABLET BY MOUTH EVERY 8 HOURS AS NEEDED FOR NAUSEA  AND VOMITING 45 tablet 3   paliperidone (INVEGA) 6 MG 24 hr tablet Take 6 mg by mouth every morning.     polyethylene glycol (MIRALAX / GLYCOLAX) packet Take 17 g by mouth 2 (two) times daily.     prazosin (MINIPRESS) 1 MG capsule Take 1 mg by  mouth at bedtime.     sertraline (ZOLOFT) 100 MG tablet Take 200 mg by mouth every morning.     traMADol (ULTRAM) 50 MG tablet Take 2 tablets (100 mg total) by mouth every 6 (six) hours as needed for severe pain. Must last 30 days 240 tablet 2   traZODone (DESYREL) 50 MG tablet 1 TABLET BY MOUTH AT BEDTIME  5   Wheat Dextrin (BENEFIBER) POWD Take 6 g by mouth 3 (three) times daily before meals. (2 tsp = 6 g) 730 g 2   ipratropium-albuterol (DUONEB) 0.5-2.5 (3) MG/3ML SOLN INHALE 3 MLS VIA NEBULIZER INTO THE LUNGS. SEE ADMIN INSTRUCTIONS 360 mL 2   lisinopril (ZESTRIL) 5 MG tablet TAKE 1 TABLET (5 MG TOTAL) BY MOUTH DAILY. **NEEDS APT FOR FURTHER REFILLS** 30 tablet 0   montelukast (SINGULAIR) 10 MG tablet TAKE 1 TABLET BY MOUTH EVERY DAY 90 tablet 3   PROAIR HFA 108 (90 Base) MCG/ACT inhaler INHALE 2 PUFFS INTO THE LUNGS 4 (FOUR) TIMES DAILY. 18 g 5   No facility-administered medications prior to visit.    Allergies  Allergen Reactions   Latex Hives   Penicillins Hives   Codeine Anxiety   Cortisone Swelling   Sulfa Antibiotics     ROS Review of Systems A fourteen system review of systems was performed and found to be positive as per HPI.   Objective:    Physical Exam General:  Well Developed, well nourished, appropriate for stated age.  Neuro:  Alert and oriented,  extra-ocular muscles intact  HEENT:  Normocephalic, atraumatic, clear conjunctiva, normal TM's of both ears, neck supple Skin:  no gross rash, warm, pink. Cardiac:  RRR, S1 S2 Respiratory:  ECTA B/L, Not using accessory muscles, speaking in full sentences- unlabored. Vascular:  Ext warm, no cyanosis apprec.; no gross edema Psych:  No HI/SI, judgement and insight  good, Euthymic mood. Full Affect.  BP 121/67    Pulse 84    Ht 5\' 4"  (1.626 m)    Wt 192 lb 9.6 oz (87.4 kg)    SpO2 100%    BMI 33.06 kg/m  Wt Readings from Last 3 Encounters:  11/17/19 192 lb 9.6 oz (87.4 kg)  11/13/19 193 lb (87.5 kg)  10/27/19 193 lb (87.5 kg)     Health Maintenance Due  Topic Date Due   COVID-19 Vaccine (1) Never done   TETANUS/TDAP  Never done   DEXA SCAN  Never done   PNA vac Low Risk Adult (1 of 2 - PCV13) 08/11/2018    There are no preventive care reminders to display for this patient.  Lab Results  Component Value Date   TSH 2.490 05/29/2018   Lab Results  Component Value Date   WBC 6.2 08/05/2019   HGB 12.2 08/05/2019   HCT 39.1 08/05/2019   MCV 83.0 08/05/2019   PLT 212 08/05/2019   Lab Results  Component Value Date   NA 142 08/05/2019   K 4.1 08/05/2019   CHLORIDE 106 08/03/2016   CO2 28 08/05/2019   GLUCOSE 99 08/05/2019   BUN 7 (L) 08/05/2019   CREATININE 0.84 08/05/2019   BILITOT 0.3 08/05/2019   ALKPHOS 80 08/05/2019   AST 14 (L) 08/05/2019   ALT 12 08/05/2019   PROT 7.5 08/05/2019   ALBUMIN 3.5 08/05/2019   CALCIUM 9.8 08/05/2019   ANIONGAP 9 08/05/2019   EGFR 80 (L) 08/03/2016   Lab Results  Component Value Date   CHOL 187 05/29/2018   Lab  Results  Component Value Date   HDL 37 (L) 05/29/2018   Lab Results  Component Value Date   LDLCALC 125 (H) 05/29/2018   Lab Results  Component Value Date   TRIG 126 05/29/2018   Lab Results  Component Value Date   CHOLHDL 5.1 (H) 05/29/2018   Lab Results  Component Value Date   HGBA1C 5.6 05/29/2018      Assessment & Plan:   Problem List Items Addressed This Visit      Cardiovascular and Mediastinum   Essential hypertension - Primary   Relevant Medications   lisinopril (ZESTRIL) 5 MG tablet     Respiratory   Intermittent asthma   Relevant Medications   ipratropium-albuterol (DUONEB) 0.5-2.5 (3) MG/3ML SOLN   montelukast (SINGULAIR) 10 MG tablet    albuterol (PROAIR HFA) 108 (90 Base) MCG/ACT inhaler     Other   Hyperlipidemia   Relevant Medications   lisinopril (ZESTRIL) 5 MG tablet   Mood disorder (HCC)    Other Visit Diagnoses    Seasonal allergies       Relevant Medications   loratadine (CLARITIN) 10 MG tablet   montelukast (SINGULAIR) 10 MG tablet   Need for influenza vaccination       Relevant Orders   Flu vaccine HIGH DOSE PF (Fluzone High dose) (Completed)     Essential hypertension: -BP at goal -Continue current medication regimen.  Provided refill. -Continue low-sodium diet.  Stay well-hydrated. -Will continue to monitor. -Advised to schedule lab appointment for fasting blood work including CMP for medication monitoring.  Hyperlipidemia: -Last lipid panel: Total cholesterol 187, triglycerides 126, HDL 37, LDL 125 -Recommend to continue with a heart healthy diet low in saturated and transfats.  Stay as active as possible. -Will repeat lipid panel with fasting blood work.  Mood disorder: -Followed by psychiatry. Continue with therapy sessions. -PHQ-9 score of 17, patient's baseline -Recommend to continue with current medication regimen. Depression screen [facility] 2/9 11/17/2019 08/07/2019 06/03/2019 04/28/2019 11/21/2018  Decreased Interest 1 0 0 3 1  Down, Depressed, Hopeless 2 0 0 2 1  PHQ - 2 Score 3 0 0 5 2  Altered sleeping 3 - - 3 3  Tired, decreased energy 3 - - 3 3  Change in appetite 3 - - 3 3  Feeling bad or failure about yourself  2 - - 2 1  Trouble concentrating 1 - - 3 1  Moving slowly or fidgety/restless 2 - - 3 3  Suicidal thoughts 0 - - 0 0  PHQ-9 Score 17 - - 22 16  Difficult doing work/chores Somewhat difficult - - Very difficult Somewhat difficult  Some recent data might be hidden   Intermittent asthma: -Stable. -Continue current medication regimen and advised to use Advair as directed.  Provided refill.  Seasonal allergies: -Continue Singulair. Will start antihistamine therapy with  loratadine to help improve symptoms.   Meds ordered this encounter  Medications   lisinopril (ZESTRIL) 5 MG tablet    Sig: Take 1 tablet (5 mg total) by mouth daily.    Dispense:  30 tablet    Refill:  0   loratadine (CLARITIN) 10 MG tablet    Sig: Take 1 tablet (10 mg total) by mouth daily.    Dispense:  90 tablet    Refill:  0    Order Specific Question:   Supervising Provider    Answer:   Nani Gasser D [[id number]]   ipratropium-albuterol (DUONEB) 0.5-2.5 (3) MG/3ML SOLN    Sig:  INHALE 3 MLS VIA NEBULIZER INTO THE LUNGS. SEE ADMIN INSTRUCTIONS    Dispense:  360 mL    Refill:  2    Order Specific Question:   Supervising Provider    Answer:   Beatrice Lecher D [[id number]]   montelukast (SINGULAIR) 10 MG tablet    Sig: Take 1 tablet (10 mg total) by mouth daily.    Dispense:  90 tablet    Refill:  2    Order Specific Question:   Supervising Provider    Answer:   Beatrice Lecher D [[id number]]   albuterol (PROAIR HFA) 108 (90 Base) MCG/ACT inhaler    Sig: INHALE 2 PUFFS INTO THE LUNGS 4 (FOUR) TIMES DAILY.    Dispense:  18 g    Refill:  2    Order Specific Question:   Supervising Provider    Answer:   Beatrice Lecher D [[id number]]    Follow-up: Return for HTN, HLD, Mood in 4-5 months; lab visit for FBW in 1-4 weeks.   Note:  This note was prepared with assistance of Dragon voice recognition software. Occasional wrong-word or sound-a-like substitutions may have occurred due to the inherent limitations of voice recognition software.   Lorrene Reid, PA-C

## 2019-11-17 NOTE — Patient Instructions (Signed)

## 2019-11-27 ENCOUNTER — Other Ambulatory Visit: Payer: Self-pay

## 2019-11-27 ENCOUNTER — Ambulatory Visit (HOSPITAL_BASED_OUTPATIENT_CLINIC_OR_DEPARTMENT_OTHER): Payer: Medicare Other | Admitting: Pain Medicine

## 2019-11-27 ENCOUNTER — Ambulatory Visit
Admission: RE | Admit: 2019-11-27 | Discharge: 2019-11-27 | Disposition: A | Discharge status: Home / Self Care | Payer: Medicare Other | Source: Ambulatory Visit | Attending: Pain Medicine | Admitting: Pain Medicine

## 2019-11-27 VITALS — BP 123/77 | HR 65 | Temp 97.1°F | Resp 16 | Ht 64.0 in | Wt 192.0 lb

## 2019-11-27 DIAGNOSIS — M7591 Shoulder lesion, unspecified, right shoulder: Secondary | ICD-10-CM

## 2019-11-27 DIAGNOSIS — G8929 Other chronic pain: Secondary | ICD-10-CM | POA: Insufficient documentation

## 2019-11-27 DIAGNOSIS — M1991 Primary osteoarthritis, unspecified site: Secondary | ICD-10-CM

## 2019-11-27 DIAGNOSIS — Z91040 Latex allergy status: Secondary | ICD-10-CM

## 2019-11-27 DIAGNOSIS — M25511 Pain in right shoulder: Secondary | ICD-10-CM

## 2019-11-27 DIAGNOSIS — M778 Other enthesopathies, not elsewhere classified: Secondary | ICD-10-CM | POA: Diagnosis present

## 2019-11-27 MED ORDER — MIDAZOLAM HCL 5 MG/5ML IJ SOLN
INTRAMUSCULAR | Status: AC
  Filled 2019-11-27: qty 5

## 2019-11-27 MED ORDER — METHYLPREDNISOLONE ACETATE 80 MG/ML IJ SUSP
INTRAMUSCULAR | Status: AC
  Filled 2019-11-27: qty 1

## 2019-11-27 MED ORDER — ROPIVACAINE HCL 2 MG/ML IJ SOLN
INTRAMUSCULAR | Status: AC
  Filled 2019-11-27: qty 10

## 2019-11-27 MED ORDER — LIDOCAINE HCL 2 % IJ SOLN
INTRAMUSCULAR | Status: AC
  Filled 2019-11-27: qty 20

## 2019-11-27 MED ORDER — METHYLPREDNISOLONE ACETATE 80 MG/ML IJ SUSP: 80.0000 mg | Freq: Once | INTRAMUSCULAR | Status: DC

## 2019-11-27 MED ORDER — FENTANYL CITRATE (PF) 100 MCG/2ML IJ SOLN
INTRAMUSCULAR | Status: AC
  Filled 2019-11-27: qty 2

## 2019-11-27 MED ORDER — FENTANYL CITRATE (PF) 100 MCG/2ML IJ SOLN
25.0000 ug | INTRAMUSCULAR | Status: DC | PRN
  Administered 2019-11-27: 50 ug via INTRAVENOUS

## 2019-11-27 MED ORDER — LACTATED RINGERS IV SOLN
1000.0000 mL | Freq: Once | INTRAVENOUS | Status: AC
  Administered 2019-11-27: 1000 mL via INTRAVENOUS

## 2019-11-27 MED ORDER — IOHEXOL 180 MG/ML  SOLN
10.0000 mL | Freq: Once | INTRAMUSCULAR | Status: AC
  Administered 2019-11-27: 10 mL via INTRA_ARTICULAR

## 2019-11-27 MED ORDER — ROPIVACAINE HCL 2 MG/ML IJ SOLN
9.0000 mL | Freq: Once | INTRAMUSCULAR | Status: AC
  Administered 2019-11-27: 9 mL via INTRA_ARTICULAR

## 2019-11-27 MED ORDER — LIDOCAINE HCL 2 % IJ SOLN
20.0000 mL | Freq: Once | INTRAMUSCULAR | Status: AC
  Administered 2019-11-27: 400 mg

## 2019-11-27 MED ORDER — MIDAZOLAM HCL 5 MG/5ML IJ SOLN
1.0000 mg | INTRAMUSCULAR | Status: DC | PRN
  Administered 2019-11-27: 1 mg via INTRAVENOUS

## 2019-11-27 NOTE — Progress Notes (Signed)
Safety precautions to be maintained throughout the outpatient stay will include: orient to surroundings, keep bed in low position, maintain call bell within reach at all times, provide assistance with transfer out of bed and ambulation.  

## 2019-11-27 NOTE — Patient Instructions (Signed)

## 2019-11-27 NOTE — Progress Notes (Signed)
PROVIDER NOTE: Information contained herein reflects review and annotations entered in association with encounter. Interpretation of such information and data should be left to medically-trained personnel. Information provided to patient can be located elsewhere in the medical record under "Patient Instructions". Document created using STT-dictation technology, any transcriptional errors that may result from process are unintentional.    Patient: Rachael West  Service Category: Procedure  Provider: Gaspar Cola, MD  DOB: 1955/06/22  DOS: 11/27/2019  Location: [facility]  MRN: [id number]  Setting: Ambulatory - outpatient  Referring Provider: Lorrene Reid, PA-C  Type: Established Patient  Specialty: Interventional Pain Management  PCP: Lorrene Reid, PA-C   Primary Reason for Visit: Interventional Pain Management Treatment. CC: Shoulder Pain (right)  Procedure:          Anesthesia, Analgesia, Anxiolysis:  Type: Diagnostic Glenohumeral and acromioclavicular joint Injection #1  Primary Purpose: Diagnostic Region: Superior Shoulder Area Level:  Shoulder Target Area: Glenohumeral and acromioclavicular joint Approach: Posterolateral approach. Laterality: Right  Type: Moderate (Conscious) Sedation combined with Local Anesthesia Indication(s): Analgesia and Anxiety Route: Intravenous (IV) IV Access: Secured Sedation: Meaningful verbal contact was maintained at all times during the procedure  Local Anesthetic: Lidocaine 1-2%  Position: Prone with head of the table was raised to facilitate breathing.   Indications: 1. Chronic shoulder pain (Right)   2. Shoulder enthesopathy (Right)   3. Osteoarthrosis of multiple sites   4. Arthralgia of shoulder region (Right)   5. Latex precautions, history of latex allergy    Pain Score: Pre-procedure: 7 /10 Post-procedure: 0-No pain/10   Pre-op H&P Assessment:  Rachael West is a 64 y.o. (year old), female  patient, seen today for interventional treatment. She  has a past surgical history that includes Appendectomy; Trigger finger release; Esophagogastroduodenoscopy (01/17/2011); Colonoscopy (01/17/2011); Abdominal hysterectomy; and Breast biopsy (Left, 2015). Rachael West has a current medication list which includes the following prescription(s): albuterol, atenolol, b complex vitamins, buspirone, dimenhydrinate, fluticasone-salmeterol, hydroxyzine, ipratropium-albuterol, latanoprost, lisinopril, loratadine, lubiprostone, magnesium, meloxicam, mirabegron er, montelukast, omeprazole, ondansetron, paliperidone, polyethylene glycol, prazosin, sertraline, tramadol, trazodone, and benefiber, and the following Facility-Administered Medications: fentanyl, methylprednisolone acetate, and midazolam. Her primarily concern today is the Shoulder Pain (right)  Initial Vital Signs:  Pulse/HCG Rate: 67 (nsr)ECG Heart Rate: 68 Temp: (!) 97.4 F (36.3 C) Resp: 16 BP: 136/82 SpO2: 98 %  BMI: Estimated body mass index is 32.96 kg/m as calculated from the following:   Height as of this encounter: 5\' 4"  (1.626 m).   Weight as of this encounter: 192 lb (87.1 kg).  Risk Assessment: Allergies: Reviewed. She is allergic to latex, penicillins, codeine, cortisone, and sulfa antibiotics.  Allergy Precautions: None required Coagulopathies: Reviewed. None identified.  Blood-thinner therapy: None at this time Active Infection(s): Reviewed. None identified. Rachael West is afebrile  Site Confirmation: Rachael West was asked to confirm the procedure and laterality before marking the site Procedure checklist: Completed Consent: Before the procedure and under the influence of no sedative(s), amnesic(s), or anxiolytics, the patient was informed of the treatment options, risks and possible complications. To fulfill our ethical and legal obligations, as recommended by the American Medical Association's Code of Ethics, I  have informed the patient of my clinical impression; the nature and purpose of the treatment or procedure; the risks, benefits, and possible complications of the intervention; the alternatives, including doing nothing; the risk(s) and benefit(s) of the alternative treatment(s) or procedure(s); and the risk(s) and benefit(s) of doing nothing. The patient was provided information about the  general risks and possible complications associated with the procedure. These may include, but are not limited to: failure to achieve desired goals, infection, bleeding, organ or nerve damage, allergic reactions, paralysis, and death. In addition, the patient was informed of those risks and complications associated to the procedure, such as failure to decrease pain; infection; bleeding; organ or nerve damage with subsequent damage to sensory, motor, and/or autonomic systems, resulting in permanent pain, numbness, and/or weakness of one or several areas of the body; allergic reactions; (i.e.: anaphylactic reaction); and/or death. Furthermore, the patient was informed of those risks and complications associated with the medications. These include, but are not limited to: allergic reactions (i.e.: anaphylactic or anaphylactoid reaction(s)); adrenal axis suppression; blood sugar elevation that in diabetics may result in ketoacidosis or comma; water retention that in patients with history of congestive heart failure may result in shortness of breath, pulmonary edema, and decompensation with resultant heart failure; weight gain; swelling or edema; medication-induced neural toxicity; particulate matter embolism and blood vessel occlusion with resultant organ, and/or nervous system infarction; and/or aseptic necrosis of one or more joints. Finally, the patient was informed that Medicine is not an exact science; therefore, there is also the possibility of unforeseen or unpredictable risks and/or possible complications that may result in  a catastrophic outcome. The patient indicated having understood very clearly. We have given the patient no guarantees and we have made no promises. Enough time was given to the patient to ask questions, all of which were answered to the patient's satisfaction. Ms. Timmins has indicated that she wanted to continue with the procedure. Attestation: I, the ordering provider, attest that I have discussed with the patient the benefits, risks, side-effects, alternatives, likelihood of achieving goals, and potential problems during recovery for the procedure that I have provided informed consent. Date  Time: 11/27/2019  9:19 AM  Pre-Procedure Preparation:  Monitoring: As per clinic protocol. Respiration, ETCO2, SpO2, BP, heart rate and rhythm monitor placed and checked for adequate function Safety Precautions: Patient was assessed for positional comfort and pressure points before starting the procedure. Time-out: I initiated and conducted the "Time-out" before starting the procedure, as per protocol. The patient was asked to participate by confirming the accuracy of the "Time Out" information. Verification of the correct person, site, and procedure were performed and confirmed by me, the nursing staff, and the patient. "Time-out" conducted as per Joint Commission's Universal Protocol (UP.01.01.01). Time: 1018  Description of Procedure:          Area Prepped: Entire shoulder Area DuraPrep (Iodine Povacrylex [0.7% available iodine] and Isopropyl Alcohol, 74% w/w) Safety Precautions: Aspiration looking for blood return was conducted prior to all injections. At no point did we inject any substances, as a needle was being advanced. No attempts were made at seeking any paresthesias. Safe injection practices and needle disposal techniques used. Medications properly checked for expiration dates. SDV (single dose vial) medications used. Description of the Procedure: Protocol guidelines were followed. The patient  was placed in position over the procedure table. The target area was identified and the area prepped in the usual manner. Skin & deeper tissues infiltrated with local anesthetic. Appropriate amount of time allowed to pass for local anesthetics to take effect. The procedure needles were then advanced to the target area. Proper needle placement secured. Negative aspiration confirmed. Solution injected in intermittent fashion, asking for systemic symptoms every 0.5cc of injectate. The needles were then removed and the area cleansed, making sure to leave some of the prepping  solution back to take advantage of its long term bactericidal properties.         Vitals:   11/27/19 1025 11/27/19 1035 11/27/19 1045 11/27/19 1055  BP: 132/90 121/75 123/77   Pulse: 65     Resp: 16 17 17 16   Temp:  (!) 97 F (36.1 C)  (!) 97.1 F (36.2 C)  TempSrc:      SpO2: 97% 98% 97% 98%  Weight:      Height:        Start Time: 1018 hrs. End Time: 1024 hrs. Materials:  Needle(s) Type: Spinal Needle Gauge: 22G Length: 3.5-in Medication(s): Please see orders for medications and dosing details.  Imaging Guidance (Non-Spinal):          Type of Imaging Technique: Fluoroscopy Guidance (Non-Spinal) Indication(s): Assistance in needle guidance and placement for procedures requiring needle placement in or near specific anatomical locations not easily accessible without such assistance. Exposure Time: Please see nurses notes. Contrast: Before injecting any contrast, we confirmed that the patient did not have an allergy to iodine, shellfish, or radiological contrast. Once satisfactory needle placement was completed at the desired level, radiological contrast was injected. Contrast injected under live fluoroscopy. No contrast complications. See chart for type and volume of contrast used. Fluoroscopic Guidance: I was personally present during the use of fluoroscopy. "Tunnel Vision Technique" used to obtain the best possible  view of the target area. Parallax error corrected before commencing the procedure. "Direction-depth-direction" technique used to introduce the needle under continuous pulsed fluoroscopy. Once target was reached, antero-posterior, oblique, and lateral fluoroscopic projection used confirm needle placement in all planes. Images permanently stored in EMR.            Interpretation: I personally interpreted the imaging intraoperatively. Adequate needle placement confirmed in multiple planes. Appropriate spread of contrast into desired area was observed. No evidence of afferent or efferent intravascular uptake. Permanent images saved into the patient's record.  Antibiotic Prophylaxis:   Anti-infectives (From admission, onward)   None     Indication(s): None identified  Post-operative Assessment:  Post-procedure Vital Signs:  Pulse/HCG Rate: 6570 Temp: (!) 97.1 F (36.2 C) Resp: 16 BP: 123/77 SpO2: 98 %  EBL: None  Complications: No immediate post-treatment complications observed by team, or reported by patient.  Note: The patient tolerated the entire procedure well. A repeat set of vitals were taken after the procedure and the patient was kept under observation following institutional policy, for this type of procedure. Post-procedural neurological assessment was performed, showing return to baseline, prior to discharge. The patient was provided with post-procedure discharge instructions, including a section on how to identify potential problems. Should any problems arise concerning this procedure, the patient was given instructions to immediately contact us, at any time, without hesitation. In any case, we plan to contact the patient by telephone for a follow-up status report regarding this interventional procedure.  Comments:  No additional relevant information.  Plan of Care  Orders:  Orders Placed This Encounter  Procedures  . SHOULDER INJECTION    Scheduling Instructions:      Procedure: Intra-articular shoulder (Glenohumeral) joint injection     Side: Right-sided     Level: Glenohumeral joint               Sedation: Patient's choice.     Timeframe: Today    Order Specific Question:   Where will this procedure be performed?    Answer:   [facility]  . [place]  C-ARM 1-60 MIN NO REPORT    Intraoperative interpretation by procedural physician at [place].    Standing Status:   Standing    Number of Occurrences:   1    Order Specific Question:   Reason for exam:    Answer:   Assistance in needle guidance and placement for procedures requiring needle placement in or near specific anatomical locations not easily accessible without such assistance.  . Informed Consent Details: Physician/Practitioner Attestation; Transcribe to consent form and obtain patient signature    Note: Always confirm laterality of pain with Ms. Duford, before procedure.    Order Specific Question:   Physician/Practitioner attestation of informed consent for procedure/surgical case    Answer:   I, the physician/practitioner, attest that I have discussed with the patient the benefits, risks, side effects, alternatives, likelihood of achieving goals and potential problems during recovery for the procedure that I have provided informed consent.    Order Specific Question:   Procedure    Answer:   Intra-articular shoulder joint injection under fluoroscopic guidance    Order Specific Question:   Physician/Practitioner performing the procedure    Answer:   Delberta Folts A. Dossie Arbour, MD    Order Specific Question:   Indication/Reason    Answer:   Chronic shoulder pain secondary to shoulder arthropathy  . Provide equipment / supplies at bedside    "Block Tray" (Disposable  single use) Needle type: SpinalSpinal Amount/quantity: 1 Size: Regular (3.5-inch) Gauge: 22G    Standing Status:   Standing    Number of Occurrences:   1    Order Specific Question:   Specify     Answer:   Block Tray  . Latex precautions    Activate Latex-Free Protocol.    Standing Status:   Standing    Number of Occurrences:   1   Chronic Opioid Analgesic:  Tramadol50 mg,2tabs (100 mg) PO q 6 hrs (400 mg/day of tramadol) MME/day:40mg /day.   Medications ordered for procedure: Meds ordered this encounter  Medications  . iohexol (OMNIPAQUE) 180 MG/ML injection 10 mL    Must be Myelogram-compatible. If not available, you may substitute with a water-soluble, non-ionic, hypoallergenic, myelogram-compatible radiological contrast medium.  Marland Kitchen lidocaine (XYLOCAINE) 2 % (with pres) injection 400 mg  . lactated ringers infusion 1,000 mL  . midazolam (VERSED) 5 MG/5ML injection 1-2 mg    Make sure Flumazenil is available in the pyxis when using this medication. If oversedation occurs, administer 0.2 mg IV over 15 sec. If after 45 sec no response, administer 0.2 mg again over 1 min; may repeat at 1 min intervals; not to exceed 4 doses (1 mg)  . fentaNYL (SUBLIMAZE) injection 25-50 mcg    Make sure Narcan is available in the pyxis when using this medication. In the event of respiratory depression (RR< 8/min): Titrate NARCAN (naloxone) in increments of 0.1 to 0.2 mg IV at 2-3 minute intervals, until desired degree of reversal.  . methylPREDNISolone acetate (DEPO-MEDROL) injection 80 mg  . ropivacaine (PF) 2 mg/mL (0.2%) (NAROPIN) injection 9 mL   Medications administered: We administered iohexol, lidocaine, lactated ringers, midazolam, fentaNYL, and ropivacaine (PF) 2 mg/mL (0.2%).  See the medical record for exact dosing, route, and time of administration.  Follow-up plan:   Return in about 2 weeks (around 12/11/2019) for (F2F), (PP) Follow-up.       Interventional management options:  Considering:   NOTE: LATEX ALLERGY & Hx Hepatitis C (w/ cirrhosis) Diagnostic left LESI Possible bilateral lumbar facetRFA Diagnostic  bilateral thoracic facet block Possiblebilateral thoracic  facetRFA Possible bilateral cervical facetRFA Diagnostic bilateral GONB Diagnostic trigger point injections   Palliative PRN treatment(s):   Diagnostic right shoulder injection #2  Palliative/diagnosticbilateral lumbar facet block#4(100/100/90/90) Palliative/diagnostic bilateralcervicalfacet block#2    Recent Visits Date Type Provider Dept  11/13/19 Procedure visit Milinda Pointer, MD [facility]  09/03/19 Telemedicine Milinda Pointer, MD [facility]  Showing recent visits within past 90 days and meeting all other requirements Today's Visits Date Type Provider Dept  11/27/19 Procedure visit Milinda Pointer, MD [facility]  Showing today's visits and meeting all other requirements Future Appointments Date Type Provider Dept  12/15/19 Appointment Milinda Pointer, MD [facility]  Showing future appointments within next 90 days and meeting all other requirements  Disposition: Discharge home  Discharge (Date  Time): 11/27/2019; 1056 hrs.   Primary Care Physician: Lorrene Reid, PA-C Location: [facility] Note by: Gaspar Cola, MD Date: 11/27/2019; Time: 2:24 PM  Disclaimer:  Medicine is not an Chief Strategy Officer. The only guarantee in medicine is that nothing is guaranteed. It is important to note that the decision to proceed with this intervention was based on the information collected from the patient. The Data and conclusions were drawn from the patient's questionnaire, the interview, and the physical examination. Because the information was provided in large part by the patient, it cannot be guaranteed that it has not been purposely or unconsciously manipulated. Every effort has been made to obtain as much relevant data as possible for this evaluation. It is important to note that the conclusions that lead to this procedure are derived in large part from the available data. Always take into  account that the treatment will also be dependent on availability of resources and existing treatment guidelines, considered by other Pain Management Practitioners as being common knowledge and practice, at the time of the intervention. For Medico-Legal purposes, it is also important to point out that variation in procedural techniques and pharmacological choices are the acceptable norm. The indications, contraindications, technique, and results of the above procedure should only be interpreted and judged by a Board-Certified Interventional Pain Specialist with extensive familiarity and expertise in the same exact procedure and technique.

## 2019-11-28 ENCOUNTER — Telehealth: Payer: Self-pay

## 2019-11-28 NOTE — Telephone Encounter (Signed)
Post procedure phone call.  Patient states that she is doing good.  
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
## 2019-12-15 ENCOUNTER — Ambulatory Visit: Payer: Medicare Other | Attending: Pain Medicine | Admitting: Pain Medicine

## 2019-12-15 ENCOUNTER — Other Ambulatory Visit: Payer: Self-pay

## 2019-12-15 ENCOUNTER — Encounter: Payer: Self-pay | Admitting: Pain Medicine

## 2019-12-15 DIAGNOSIS — G8929 Other chronic pain: Secondary | ICD-10-CM

## 2019-12-15 DIAGNOSIS — M542 Cervicalgia: Secondary | ICD-10-CM | POA: Diagnosis not present

## 2019-12-15 DIAGNOSIS — G894 Chronic pain syndrome: Secondary | ICD-10-CM

## 2019-12-15 DIAGNOSIS — M5412 Radiculopathy, cervical region: Secondary | ICD-10-CM | POA: Diagnosis not present

## 2019-12-15 DIAGNOSIS — M25511 Pain in right shoulder: Secondary | ICD-10-CM

## 2019-12-15 DIAGNOSIS — M545 Low back pain, unspecified: Secondary | ICD-10-CM
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
## 2019-12-29 ENCOUNTER — Ambulatory Visit: Payer: Medicare Other | Admitting: Pain Medicine

## 2020-01-08 ENCOUNTER — Telehealth: Payer: Self-pay

## 2020-01-08 NOTE — Telephone Encounter (Signed)
Called patient back after getting message off the phone. She states that she only got 7 days worth of her Tramadol . Instructed her that she will need to call her pharm to get a prior auth request sent. Theodoro Doing did a PA on this med today.

## 2020-01-13 ENCOUNTER — Telehealth: Payer: Self-pay | Admitting: Pain Medicine

## 2020-01-13 NOTE — Telephone Encounter (Signed)
Patient lvmail stating she only received 56 pills to last her 30 days? Please call her

## 2020-01-14 NOTE — Telephone Encounter (Signed)
States pharmacy gave her the "7 day supply" amount and I instructed her to call us if she has any issues getting the remaining pills left on the script after the 7 days.

## 2020-01-18 ENCOUNTER — Other Ambulatory Visit: Payer: Self-pay | Admitting: Gastroenterology

## 2020-01-18 ENCOUNTER — Other Ambulatory Visit: Payer: Self-pay | Admitting: Physician Assistant

## 2020-01-18 DIAGNOSIS — I10 Essential (primary) hypertension: Secondary | ICD-10-CM

## 2020-01-18 DIAGNOSIS — J302 Other seasonal allergic rhinitis: Secondary | ICD-10-CM

## 2020-02-11 ENCOUNTER — Other Ambulatory Visit: Payer: Self-pay | Admitting: Adult Health

## 2020-02-11 DIAGNOSIS — Z1231 Encounter for screening mammogram for malignant neoplasm of breast: Secondary | ICD-10-CM

## 2020-03-23 ENCOUNTER — Other Ambulatory Visit: Payer: Self-pay

## 2020-03-23 ENCOUNTER — Other Ambulatory Visit: Payer: Self-pay | Admitting: Physician Assistant

## 2020-03-23 ENCOUNTER — Other Ambulatory Visit: Payer: Self-pay | Admitting: Adult Health

## 2020-03-23 ENCOUNTER — Other Ambulatory Visit: Payer: Self-pay | Admitting: Family Medicine

## 2020-03-23 ENCOUNTER — Other Ambulatory Visit: Payer: Self-pay | Admitting: Pain Medicine

## 2020-03-23 DIAGNOSIS — M7918 Myalgia, other site: Secondary | ICD-10-CM

## 2020-03-23 DIAGNOSIS — E559 Vitamin D deficiency, unspecified: Secondary | ICD-10-CM

## 2020-03-23 DIAGNOSIS — K5903 Drug induced constipation: Secondary | ICD-10-CM

## 2020-03-23 DIAGNOSIS — M1991 Primary osteoarthritis, unspecified site: Secondary | ICD-10-CM

## 2020-03-23 DIAGNOSIS — I10 Essential (primary) hypertension: Secondary | ICD-10-CM

## 2020-03-23 DIAGNOSIS — G894 Chronic pain syndrome: Secondary | ICD-10-CM

## 2020-03-23 DIAGNOSIS — J302 Other seasonal allergic rhinitis: Secondary | ICD-10-CM

## 2020-03-23 DIAGNOSIS — G8929 Other chronic pain: Secondary | ICD-10-CM

## 2020-03-24 ENCOUNTER — Other Ambulatory Visit: Payer: Self-pay | Admitting: Physician Assistant

## 2020-03-24 DIAGNOSIS — E559 Vitamin D deficiency, unspecified: Secondary | ICD-10-CM

## 2020-03-24 DIAGNOSIS — J302 Other seasonal allergic rhinitis: Secondary | ICD-10-CM

## 2020-03-24 DIAGNOSIS — I10 Essential (primary) hypertension: Secondary | ICD-10-CM

## 2020-03-24 DIAGNOSIS — K5903 Drug induced constipation: Secondary | ICD-10-CM

## 2020-03-24 DIAGNOSIS — N3941 Urge incontinence: Secondary | ICD-10-CM

## 2020-03-24 DIAGNOSIS — G8929 Other chronic pain: Secondary | ICD-10-CM

## 2020-03-24 DIAGNOSIS — M1991 Primary osteoarthritis, unspecified site: Secondary | ICD-10-CM

## 2020-03-24 MED ORDER — MELOXICAM 15 MG PO TABS: 15.0000 mg | ORAL_TABLET | Freq: Every day | ORAL | 2 refills | Status: DC

## 2020-03-24 MED ORDER — LISINOPRIL 5 MG PO TABS: 5.0000 mg | ORAL_TABLET | Freq: Every day | ORAL | 0 refills | Status: DC

## 2020-03-24 MED ORDER — MAGNESIUM 500 MG PO CAPS: 500.0000 mg | ORAL_CAPSULE | Freq: Two times a day (BID) | ORAL | 0 refills | Status: DC

## 2020-03-24 MED ORDER — ATENOLOL 25 MG PO TABS
ORAL_TABLET | ORAL | 0 refills | Status: DC
Start: 2020-03-24 — End: 2020-05-20

## 2020-03-24 MED ORDER — LUBIPROSTONE 24 MCG PO CAPS: 24.0000 ug | ORAL_CAPSULE | Freq: Two times a day (BID) | ORAL | 2 refills | Status: DC

## 2020-03-24 MED ORDER — BENEFIBER PO POWD: 6.0000 g | Freq: Three times a day (TID) | ORAL | 2 refills | Status: AC

## 2020-03-24 MED ORDER — MIRABEGRON ER 25 MG PO TB24: 25.0000 mg | ORAL_TABLET | Freq: Every day | ORAL | 0 refills | Status: DC

## 2020-03-24 MED ORDER — LORATADINE 10 MG PO TABS: 10.0000 mg | ORAL_TABLET | Freq: Every day | ORAL | 0 refills | Status: DC

## 2020-03-25 ENCOUNTER — Other Ambulatory Visit: Payer: Self-pay | Admitting: Physician Assistant

## 2020-03-25 ENCOUNTER — Encounter: Payer: Self-pay | Admitting: Physician Assistant

## 2020-03-25 ENCOUNTER — Other Ambulatory Visit: Payer: Self-pay

## 2020-03-25 ENCOUNTER — Ambulatory Visit (INDEPENDENT_AMBULATORY_CARE_PROVIDER_SITE_OTHER): Payer: Medicare Other | Admitting: Physician Assistant

## 2020-03-25 VITALS — BP 121/77 | HR 86 | Temp 96.4°F | Ht 66.0 in | Wt 181.8 lb

## 2020-03-25 DIAGNOSIS — Z8639 Personal history of other endocrine, nutritional and metabolic disease: Secondary | ICD-10-CM

## 2020-03-25 DIAGNOSIS — Z87898 Personal history of other specified conditions: Secondary | ICD-10-CM | POA: Diagnosis not present

## 2020-03-25 DIAGNOSIS — I10 Essential (primary) hypertension: Secondary | ICD-10-CM | POA: Diagnosis not present

## 2020-03-25 DIAGNOSIS — E785 Hyperlipidemia, unspecified: Secondary | ICD-10-CM | POA: Diagnosis not present

## 2020-03-25 DIAGNOSIS — Z0000 Encounter for general adult medical examination without abnormal findings: Secondary | ICD-10-CM

## 2020-03-25 NOTE — Patient Instructions (Signed)
Preventive Care 67 Years and Older, Female Preventive care refers to lifestyle choices and visits with your health care provider that can promote health and wellness. This includes:  A yearly physical exam. This is also called an annual wellness visit.  Regular dental and eye exams.  Immunizations.  Screening for certain conditions.  Healthy lifestyle choices, such as: ? Eating a healthy diet. ? Getting regular exercise. ? Not using drugs or products that contain nicotine and tobacco. ? Limiting alcohol use. What can I expect for my preventive care visit? Physical exam Your health care provider will check your:  Height and weight. These may be used to calculate your BMI (body mass index). BMI is a measurement that tells if you are at a healthy weight.  Heart rate and blood pressure.  Body temperature.  Skin for abnormal spots. Counseling Your health care provider may ask you questions about your:  Past medical problems.  Family's medical history.  Alcohol, tobacco, and drug use.  Emotional well-being.  Home life and relationship well-being.  Sexual activity.  Diet, exercise, and sleep habits.  History of falls.  Memory and ability to understand (cognition).  Work and work Statistician.  Pregnancy and menstrual history.  Access to firearms. What immunizations do I need? Vaccines are usually given at various ages, according to a schedule. Your health care provider will recommend vaccines for you based on your age, medical history, and lifestyle or other factors, such as travel or where you work.   What tests do I need? Blood tests  Lipid and cholesterol levels. These may be checked every 5 years, or more often depending on your overall health.  Hepatitis C test.  Hepatitis B test. Screening  Lung cancer screening. You may have this screening every year starting at age 67 if you have a 30-pack-year history of smoking and currently smoke or have quit within  the past 15 years.  Colorectal cancer screening. ? All adults should have this screening starting at age 67 and continuing until age 67. ? Your health care provider may recommend screening at age 67 if you are at increased risk. ? You will have tests every 1-10 years, depending on your results and the type of screening test.  Diabetes screening. ? This is done by checking your blood sugar (glucose) after you have not eaten for a while (fasting). ? You may have this done every 1-3 years.  Mammogram. ? This may be done every 1-2 years. ? Talk with your health care provider about how often you should have regular mammograms.  Abdominal aortic aneurysm (AAA) screening. You may need this if you are a current or former smoker.  BRCA-related cancer screening. This may be done if you have a family history of breast, ovarian, tubal, or peritoneal cancers. Other tests  STD (sexually transmitted disease) testing, if you are at risk.  Bone density scan. This is done to screen for osteoporosis. You may have this done starting at age 67. Talk with your health care provider about your test results, treatment options, and if necessary, the need for more tests. Follow these instructions at home: Eating and drinking  Eat a diet that includes fresh fruits and vegetables, whole grains, lean protein, and low-fat dairy products. Limit your intake of foods with high amounts of sugar, saturated fats, and salt.  Take vitamin and mineral supplements as recommended by your health care provider.  Do not drink alcohol if your health care provider tells you not to drink.  If you drink alcohol: ? Limit how much you have to 0-1 drink a day. ? Be aware of how much alcohol is in your drink. In the [place] one drink equals one 12 oz bottle of beer (355 mL), one 5 oz glass of wine (148 mL), or one 1 oz glass of hard liquor (44 mL).   Lifestyle  Take daily care of your teeth and gums. Brush your teeth every morning  and night with fluoride toothpaste. Floss one time each day.  Stay active. Exercise for at least 30 minutes 5 or more days each week.  Do not use any products that contain nicotine or tobacco, such as cigarettes, e-cigarettes, and chewing tobacco. If you need help quitting, ask your health care provider.  Do not use drugs.  If you are sexually active, practice safe sex. Use a condom or other form of protection in order to prevent STIs (sexually transmitted infections).  Talk with your health care provider about taking a low-dose aspirin or statin.  Find healthy ways to cope with stress, such as: ? Meditation, yoga, or listening to music. ? Journaling. ? Talking to a trusted person. ? Spending time with friends and family. Safety  Always wear your seat belt while driving or riding in a vehicle.  Do not drive: ? If you have been drinking alcohol. Do not ride with someone who has been drinking. ? When you are tired or distracted. ? While texting.  Wear a helmet and other protective equipment during sports activities.  If you have firearms in your house, make sure you follow all gun safety procedures. What's next?  Visit your health care provider once a year for an annual wellness visit.  Ask your health care provider how often you should have your eyes and teeth checked.  Stay up to date on all vaccines. This information is not intended to replace advice given to you by your health care provider. Make sure you discuss any questions you have with your health care provider. Document Revised: 12/17/2019 Document Reviewed: 12/20/2017 Elsevier Patient Education  2021 [employer]  

## 2020-03-25 NOTE — Progress Notes (Signed)
Subjective:   Rachael West is a 65 y.o. female who presents for Medicare Annual (Subsequent) preventive examination.  Review of Systems    General:   No F/C, wt loss Pulm:   No DIB, SOB, pleuritic chest pain Card:  No CP, palpitations Abd:  No n/v/d or pain Ext:  No inc edema from baseline    Objective:    Today's Vitals   03/25/20 1027  BP: 121/77  Pulse: 86  Temp: (!) 96.4 F (35.8 C)  SpO2: 98%  Weight: 181 lb 12.8 oz (82.5 kg)  Height: 5\' 6"  (1.676 m)   Body mass index is 29.34 kg/m.  Advanced Directives 11/27/2019 08/07/2019 06/03/2019 12/13/2017 09/26/2017 05/01/2017 08/03/2016  Does Patient Have a Medical Advance Directive? No No No No No No Yes  Would patient like information on creating a medical advance directive? - No - Patient declined - Yes (MAU/Ambulatory/Procedural Areas - Information given) No - Patient declined No - Patient declined -    Current Medications (verified) Outpatient Encounter Medications as of 03/25/2020  Medication Sig   albuterol (PROAIR HFA) 108 (90 Base) MCG/ACT inhaler INHALE 2 PUFFS INTO THE LUNGS 4 (FOUR) TIMES DAILY.   atenolol (TENORMIN) 25 MG tablet TAKE 1 TABLET EVERY DAY   b complex vitamins tablet Take 1 tablet by mouth daily.   busPIRone (BUSPAR) 5 MG tablet Take 1 tablet (5 mg total) by mouth 2 (two) times daily. (Patient taking differently: Take 5 mg by mouth 3 (three) times daily. )   dimenhyDRINATE (DRAMAMINE) 50 MG tablet Take 50 mg by mouth every 8 (eight) hours as needed.   Fluticasone-Salmeterol (ADVAIR DISKUS) 250-50 MCG/DOSE AEPB Inhale 1 puff into the lungs 2 (two) times daily.   hydrOXYzine (VISTARIL) 50 MG capsule Take 50 mg by mouth at bedtime.   ipratropium-albuterol (DUONEB) 0.5-2.5 (3) MG/3ML SOLN INHALE 3 MLS VIA NEBULIZER INTO THE LUNGS. SEE ADMIN INSTRUCTIONS   latanoprost (XALATAN) 0.005 % ophthalmic solution Place 1 drop into both eyes daily.   lisinopril (ZESTRIL) 5 MG tablet Take 1 tablet  (5 mg total) by mouth daily.   loratadine (CLARITIN) 10 MG tablet Take 1 tablet (10 mg total) by mouth daily.   lubiprostone (AMITIZA) 24 MCG capsule Take 1 capsule (24 mcg total) by mouth 2 (two) times daily with a meal. Swallow the medication whole. Do not break or chew the medication.   Magnesium 500 MG CAPS Take 1 capsule (500 mg total) by mouth 2 (two) times daily at 8 am and 10 pm.   meloxicam (MOBIC) 15 MG tablet Take 1 tablet (15 mg total) by mouth daily.   mirabegron ER (MYRBETRIQ) 25 MG TB24 tablet Take 1 tablet (25 mg total) by mouth daily. Needs to come from the Urologist- no further RFs will be provided   montelukast (SINGULAIR) 10 MG tablet Take 1 tablet (10 mg total) by mouth daily.   omeprazole (PRILOSEC) 40 MG capsule 1 po qd Needs a follow up for additional refills 10-02-2019   ondansetron (ZOFRAN) 4 MG tablet TAKE 1 TABLET BY MOUTH EVERY 8 HOURS AS NEEDED FOR NAUSEA AND VOMITING   paliperidone (INVEGA) 6 MG 24 hr tablet Take 6 mg by mouth every morning.   polyethylene glycol (MIRALAX / GLYCOLAX) packet Take 17 g by mouth 2 (two) times daily.   prazosin (MINIPRESS) 1 MG capsule Take 1 mg by mouth at bedtime.   sertraline (ZOLOFT) 100 MG tablet Take 200 mg by mouth every morning.   traMADol (  ULTRAM) 50 MG tablet Take 2 tablets (100 mg total) by mouth every 6 (six) hours as needed for severe pain. Must last 30 days   traZODone (DESYREL) 50 MG tablet 1 TABLET BY MOUTH AT BEDTIME   Wheat Dextrin (BENEFIBER) POWD Take 6 g by mouth 3 (three) times daily before meals. (2 tsp = 6 g)   No facility-administered encounter medications on file as of 03/25/2020.    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   Personal history of non-Hodgkin lymphomas    Vitamin D deficiency    Past Surgical History:  Procedure Laterality Date   ABDOMINAL HYSTERECTOMY     APPENDECTOMY     BREAST BIOPSY Left 2015   lymphoma   COLONOSCOPY  01/17/2011   Procedure: COLONOSCOPY;  Surgeon: Winfield Cunas., MD;  Location: [facility];  Service: Endoscopy;  Laterality: N/A;   ESOPHAGOGASTRODUODENOSCOPY  01/17/2011   Procedure: ESOPHAGOGASTRODUODENOSCOPY (EGD);  Surgeon: Winfield Cunas., MD;  Location: [facility];  Service: Endoscopy;  Laterality: N/A;   TRIGGER FINGER RELEASE     Family History  Problem Relation Age of Onset   Asthma Mother    Allergies Mother    Asthma Sister    Heart disease Father    Heart disease Brother    Diabetes Maternal Grandmother    Diabetes Maternal Grandfather    Heart disease Paternal Grandmother    Heart disease Paternal Grandfather    Colon cancer Paternal Aunt    Liver cancer Paternal Uncle    Breast cancer Cousin    Ovarian cancer Other        great aunt (m)   Pancreatic cancer Other        great aunt (m)   Stomach cancer Neg Hx    Rectal cancer Neg Hx    Esophageal cancer Neg Hx    Social History   Socioeconomic History   Marital status: Divorced    Spouse name: Not on file   Number of children: Not on file   Years of education: Not on file   Highest education level: Not on file  Occupational History   Occupation: Disabled  Tobacco Use   Smoking status: Former Smoker    Packs/day: 1.50    Years: 20.00    Pack years: 30.00    Types: Cigarettes    Quit date: 01/09/1990    Years since quitting: 30.2   Smokeless tobacco: Never Used  Vaping Use   Vaping Use: Never used  Substance and Sexual Activity   Alcohol use: No    Alcohol/week: 0.0 standard drinks   Drug use: No   Sexual activity: Not Currently  Other Topics Concern   Not on file  Social  History Narrative   Not on file   Social Determinants of Health   Financial Resource Strain: Not on file  Food Insecurity: Not on file  Transportation Needs: Not on file  Physical Activity: Not on file  Stress: Not on file  Social Connections: Not on file    Tobacco Counseling Counseling given: Not Answered    Diabetic? no  Activities of Daily Living In your present state of health, do you have any difficulty performing the following activities: 11/17/2019  Hearing? Y  Vision? Y  Difficulty concentrating or making decisions? Y  Walking or climbing stairs? Y  Dressing or bathing? N  Doing errands, shopping? N  Some recent data might be hidden    Patient Care Team: Lorrene Reid, PA-C as PCP - General (Physician Assistant) Laurence Spates, MD (Inactive) as Consulting Physician (Gastroenterology) Loletha Carrow Kirke Corin, MD as Consulting Physician (Gastroenterology) Elsie Saas, MD as Consulting Physician (Orthopedic Surgery) Meade Maw, MD as Consulting Physician (Neurosurgery) Milinda Pointer, MD as Consulting Physician (Pain Medicine)  Indicate any recent Medical Services you may have received from other than [facility] providers in the past year (date may be approximate).     Assessment:   This is a routine wellness examination for [place].  Hearing/Vision screen No exam data present  Dietary issues and exercise activities discussed: -Follow a heart healthy diet and continue good hydration.  Goals   None    Depression Screen PHQ 2/9 Scores 03/25/2020 11/17/2019 08/07/2019 06/03/2019 04/28/2019 11/21/2018 08/27/2018  PHQ - 2 Score 2 3 0 0 5 2 0  PHQ- 9 Score 8 17 - - 22 16 -    Fall Risk Fall Risk  11/17/2019 11/13/2019 10/27/2019 08/14/2019 08/07/2019  Falls in the past year? 1 0 0 0 1  Comment - - - - -  Number falls in past yr: 1 - - - 0  Comment - - - - -  Injury with Fall? 1 - - - 1  Comment - - - - scrape on foor, open area left hand, foot   Risk for fall due to : - No Fall Risks No Fall Risks - Other (Comment)  Risk for fall due to: Comment - - - - tripped  Follow up Falls evaluation completed Falls evaluation completed Falls evaluation completed - Falls prevention discussed    FALL RISK PREVENTION PERTAINING TO THE HOME:  Any stairs in or around the home? Yes  If so, are there any without handrails? No  Home free of loose throw rugs in walkways, pet beds, electrical cords, etc? Yes  Adequate lighting in your home to reduce risk of falls? Yes   ASSISTIVE DEVICES UTILIZED TO PREVENT FALLS:  Life alert? No  Use of a cane, walker or w/c? Yes  Grab bars in the bathroom? No  Shower chair or bench in shower? No  Elevated toilet seat or a handicapped toilet? No   TIMED UP AND GO:  Was the test performed? Yes .  Length of time to ambulate 10 feet: 20 sec.   Gait slow and steady with assistive device  Cognitive Function:     6CIT Screen 03/25/2020 05/06/2018  What Year? 0 points 0 points  What month? 0 points 0 points  What time? 0 points 0 points  Count back from 20 0 points 4 points  Months in reverse 0 points 0 points  Repeat phrase - 6 points  Total Score - 10    Immunizations Immunization History  Administered Date(s) Administered   Influenza, High Dose Seasonal PF 11/24/2018, 11/17/2019   Influenza,inj,Quad PF,6+ Mos 11/06/2014   Influenza-Unspecified 10/09/2013   Pneumococcal Polysaccharide-23 03/09/2015    TDAP status: Due, Education has been provided regarding the importance of this vaccine. Advised may receive this vaccine at local pharmacy or Health Dept. Aware to provide a copy of the vaccination record if obtained from local pharmacy  or Health Dept. Verbalized acceptance and understanding.  Flu Vaccine status: Up to date  Pneumococcal vaccine status: Due, Education has been provided regarding the importance of this vaccine. Advised may receive this vaccine at local pharmacy or Health Dept.  Aware to provide a copy of the vaccination record if obtained from local pharmacy or Health Dept. Verbalized acceptance and understanding.  Covid-19 vaccine status: Information provided on how to obtain vaccines.   Qualifies for Shingles Vaccine? Yes   Zostavax completed No   Shingrix Completed?: No.    Education has been provided regarding the importance of this vaccine. Patient has been advised to call insurance company to determine out of pocket expense if they have not yet received this vaccine. Advised may also receive vaccine at local pharmacy or Health Dept. Verbalized acceptance and understanding.  Screening Tests Health Maintenance  Topic Date Due   COVID-19 Vaccine (1) Never done   TETANUS/TDAP  Never done   DEXA SCAN  Never done   PNA vac Low Risk Adult (1 of 2 - PCV13) 08/11/2018   MAMMOGRAM  01/07/2021   COLONOSCOPY (Pts 45-88yrs Insurance coverage will need to be confirmed)  01/16/2021   INFLUENZA VACCINE  Completed   Hepatitis C Screening  Completed   HPV VACCINES  Aged Out    Health Maintenance  Health Maintenance Due  Topic Date Due   COVID-19 Vaccine (1) Never done   TETANUS/TDAP  Never done   DEXA SCAN  Never done   PNA vac Low Risk Adult (1 of 2 - PCV13) 08/11/2018    Colorectal cancer screening: Type of screening: Colonoscopy. Completed 01/17/2011. Repeat every 10 years  Mammogram status: Completed 01/08/2019. Repeat every year Scheduled 04/01/2020  Bone Density status: Ordered pt declined. Pt provided with contact info and advised to call to schedule appt.  Lung Cancer Screening: (Low Dose CT Chest recommended if Age 78-80 years, 30 pack-year currently smoking OR have quit w/in 15years.) does not qualify.   Lung Cancer Screening Referral: n/a  Additional Screening:  Hepatitis C Screening: does qualify; Completed 2017. Patient treated for Hep C with Harvoni. Undetectable posttreatment PCR 02/2015.  Vision Screening: Recommended annual  ophthalmology exams for early detection of glaucoma and other disorders of the eye. Is the patient up to date with their annual eye exam?  Yes  Who is the provider or what is the name of the office in which the patient attends annual eye exams? [facility] If pt is not established with a provider, would they like to be referred to a provider to establish care? No .   Dental Screening: Recommended annual dental exams for proper oral hygiene  Community Resource Referral / Chronic Care Management: CRR required this visit?  No   CCM required this visit?  No      Plan:  -Continue to follow up with various specialists. -Continue current medication regimen. Refills were sent already. -Will notify of lab results once available. -Follow up in 4 months for HTN, HLD, constipation  I have personally reviewed and noted the following in the patients chart:    Medical and social history  Use of alcohol, tobacco or illicit drugs   Current medications and supplements  Functional ability and status  Nutritional status  Physical activity  Advanced directives  List of other physicians  Hospitalizations, surgeries, and ER visits in previous 12 months  Vitals  Screenings to include cognitive, depression, and falls  Referrals and appointments  In addition, I have reviewed and discussed  with patient certain preventive protocols, quality metrics, and best practice recommendations. A written personalized care plan for preventive services as well as general preventive health recommendations were provided to patient.     Lorrene Reid, PA-C   03/26/2020

## 2020-03-26 LAB — CBC WITH DIFFERENTIAL/PLATELET
Basophils Absolute: 0.1 10*3/uL (ref 0.0–0.2)
Basos: 1 %
EOS (ABSOLUTE): 0.1 10*3/uL (ref 0.0–0.4)
Eos: 2 %
Hematocrit: 38.7 % (ref 34.0–46.6)
Hemoglobin: 12.5 g/dL (ref 11.1–15.9)
Immature Grans (Abs): 0 10*3/uL (ref 0.0–0.1)
Immature Granulocytes: 0 %
Lymphocytes Absolute: 2.2 10*3/uL (ref 0.7–3.1)
Lymphs: 49 %
MCH: 26 pg — ABNORMAL LOW (ref 26.6–33.0)
MCHC: 32.3 g/dL (ref 31.5–35.7)
MCV: 81 fL (ref 79–97)
Monocytes Absolute: 0.4 10*3/uL (ref 0.1–0.9)
Monocytes: 9 %
Neutrophils Absolute: 1.7 10*3/uL (ref 1.4–7.0)
Neutrophils: 39 %
Platelets: 250 10*3/uL (ref 150–450)
RBC: 4.8 x10E6/uL (ref 3.77–5.28)
RDW: 12.9 % (ref 11.7–15.4)
WBC: 4.5 10*3/uL (ref 3.4–10.8)

## 2020-03-26 LAB — COMPREHENSIVE METABOLIC PANEL
ALT: 11 IU/L (ref 0–32)
AST: 14 IU/L (ref 0–40)
Albumin/Globulin Ratio: 1.1 — ABNORMAL LOW (ref 1.2–2.2)
Albumin: 4.1 g/dL (ref 3.8–4.8)
Alkaline Phosphatase: 96 IU/L (ref 44–121)
BUN/Creatinine Ratio: 9 — ABNORMAL LOW (ref 12–28)
BUN: 7 mg/dL — ABNORMAL LOW (ref 8–27)
Bilirubin Total: 0.4 mg/dL (ref 0.0–1.2)
CO2: 25 mmol/L (ref 20–29)
Calcium: 9.5 mg/dL (ref 8.7–10.3)
Chloride: 104 mmol/L (ref 96–106)
Creatinine, Ser: 0.77 mg/dL (ref 0.57–1.00)
Globulin, Total: 3.6 g/dL (ref 1.5–4.5)
Glucose: 84 mg/dL (ref 65–99)
Potassium: 5.2 mmol/L (ref 3.5–5.2)
Sodium: 143 mmol/L (ref 134–144)
Total Protein: 7.7 g/dL (ref 6.0–8.5)
eGFR: 85 mL/min/{1.73_m2} (ref >59)

## 2020-03-26 LAB — TSH: TSH: 0.77 u[IU]/mL (ref 0.450–4.500)

## 2020-03-26 LAB — HEMOGLOBIN A1C
Est. average glucose Bld gHb Est-mCnc: 105 mg/dL
Hgb A1c MFr Bld: 5.3 % (ref 4.8–5.6)

## 2020-03-26 LAB — LDL CHOLESTEROL, DIRECT: LDL Direct: 122 mg/dL — ABNORMAL HIGH (ref 0–99)

## 2020-04-01 ENCOUNTER — Inpatient Hospital Stay: Admission: RE | Admit: 2020-04-01 | Payer: Medicare Other | Source: Ambulatory Visit

## 2020-04-06 NOTE — Progress Notes (Signed)
PROVIDER NOTE: Information contained herein reflects review and annotations entered in association with encounter. Interpretation of such information and data should be left to medically-trained personnel. Information provided to patient can be located elsewhere in the medical record under "Patient Instructions". Document created using STT-dictation technology, any transcriptional errors that may result from process are unintentional.    Patient: Rachael West  Service Category: E/M  Provider: Gaspar Cola, MD  DOB: 07-17-1955  DOS: 04/07/2020  Specialty: Interventional Pain Management  MRN: [id number]  Setting: Ambulatory outpatient  PCP: Lorrene Reid, PA-C  Type: Established Patient    Referring Provider: Lorrene Reid, PA-C  Location: Office  Delivery: Face-to-face     HPI  Rachael West, a 65 y.o. year old female, is here today because of her Chronic pain syndrome [G89.4]. Ms. Zarling primary complain today is Neck Pain and Back Pain (lower) Last encounter: My last encounter with her was on 03/23/2020. Pertinent problems: Ms. Ruegg has Large B-cell lymphoma ([place]); Atypical chest pain; Chronic low back pain (Primary Area of Pain) (Bilateral) w/ sciatica (Left); Chronic lower extremity pain (2ry area of Pain) (Left); Chronic upper back pain (3ry area of Pain) (Bilateral) (L>R); Chronic neck pain (4th area of Pain) (Bilateral) (L>R); Occipital headache; Chronic knee pain (Bilateral) (R>L); Chronic pain syndrome; Chronic sacroiliac joint pain; DDD (degenerative disc disease), lumbar; Lumbar facet arthropathy; Lumbar facet syndrome (Bilateral) (L>R); Spondylosis without myelopathy or radiculopathy, lumbar region; DDD (degenerative disc disease), thoracic; Thoracolumbar dextroscoliosis; Cervicogenic headache; Tendinitis of left rotator cuff; Tendinitis of right rotator cuff; Fibromyalgia; Chronic shoulder pain (Bilateral) (R>L); Chronic hip pain (Bilateral) (L>R);  Cervicalgia; Chronic idiopathic gout; DDD (degenerative disc disease), cervical; 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); Cervical facet syndrome (Bilateral) (R>L); Spondylosis without myelopathy or radiculopathy, cervical region; Chronic musculoskeletal pain; Chronic low back pain (1ry area of Pain) (Bilateral) (L>R) w/o sciatica; Cervical disc disorder at C5-C6 level with radiculopathy (Right); Cervical radiculopathy at C6 (Right); Osteoarthrosis of multiple sites; Radicular pain of upper extremity (Right); Chronic upper extremity pain (Right); Chronic shoulder pain (Right); Arthralgia of shoulder region (Right); Shoulder enthesopathy (Right); and Musculoskeletal disorder involving upper trapezius muscle (Right) on their pertinent problem list. Pain Assessment: Severity of Chronic pain is reported as a 7 /10. Location: Shoulder Right/ . Onset: More than a month ago. Quality: Shooting. Timing: Constant. Modifying factor(s): nothing. Vitals:  height is $RemoveB'5\' 5"'tRzNplAP$  (1.651 m) and weight is 181 lb (82.1 kg). Her temporal temperature is 97.1 F (36.2 C) (abnormal). Her blood pressure is 136/57 (abnormal) and her pulse is 89. Her respiration is 16 and oxygen saturation is 100%.   Reason for encounter: medication management.   The patient indicates doing well with the current medication regimen. No adverse reactions or side effects reported to the medications.  Reviewing the patient's PMP I noticed that on 01/07/2020 the pharmacy provided the patient with a partial failed on her prescription.  Today I have reminded the patient that I am on our medication rules and regulations we have that we recommend that they never accept a partial fill since it would require another prescription to complete the original amount of pills and we will not be doing that.  She understood and accepted.  Today I have provided the patient with another copy of our medication rules and regulations.  The patient describes that she is  in fact still taking the tramadol 2 tablets p.o. every 6 hours.  She comes in today and she has no pills remaining  in her bottle.  The legend on the bottle confirms that the prescription and needs refills have expired in January 2022.   In addition to this, the patient indicates having a flareup of her right shoulder and left lower back and hip pain.  She refers that the shoulder pain is worse and she would like to have this treated first.  Physical exam today reveals tenderness over the area of the acromioclavicular joint, biceps tendon, and a trigger point over the lateral aspect of the trapezius muscle on the right side.  She also shows decreased range of motion of that right shoulder.  Today we have agreed to treat the shoulder first and then we will evaluate and treat left hip.  RTCB: 07/06/2020 Nonopioids transfer 10/27/2019: Benefiber, Amitiza, magnesium, and Mobic.  Pharmacotherapy Assessment   Analgesic: Tramadol50 mg,2tabs (100 mg) PO q 6 hrs (400 mg/day of tramadol) MME/day:40mg /day.   Monitoring: [place] PMP: PDMP reviewed during this encounter.       Pharmacotherapy: No side-effects or adverse reactions reported. Compliance: No problems identified. Effectiveness: Clinically acceptable.  Landis Martins, RN  04/07/2020  8:08 AM  Sign when Signing Visit Nursing Pain Medication Assessment:  Safety precautions to be maintained throughout the outpatient stay will include: orient to surroundings, keep bed in low position, maintain call bell within reach at all times, provide assistance with transfer out of bed and ambulation.  Medication Inspection Compliance: Pill count conducted under aseptic conditions, in front of the patient. Neither the pills nor the bottle was removed from the patient's sight at any time. Once count was completed pills were immediately returned to the patient in their original bottle.  Medication: Tramadol (Ultram) Pill/Patch Count: 0 of 240 pills  remain Pill/Patch Appearance: Markings consistent with prescribed medication Bottle Appearance: Standard pharmacy container. Clearly labeled. Filled Date: 01/20/2020 Last Medication intake:  Today    UDS:  Summary  Date Value Ref Range Status  05/22/2019 Note  Final    Comment:    ==================================================================== [employer] Select 13 (MW) ==================================================================== Test                             Result       Flag       Units Drug Present and Declared for Prescription Verification   Tramadol                       >16129       EXPECTED   ng/mg creat   O-Desmethyltramadol            11787        EXPECTED   ng/mg creat   N-Desmethyltramadol            7242         EXPECTED   ng/mg creat    Source of tramadol is a prescription medication. O-desmethyltramadol    and N-desmethyltramadol are expected metabolites of tramadol. ==================================================================== Test                      Result    Flag   Units      Ref Range   Creatinine              31               mg/dL      >=20 ==================================================================== Declared Medications:  The flagging and interpretation on this report  are based on the  following declared medications.  Unexpected results may arise from  inaccuracies in the declared medications.  **Note: The testing scope of this panel includes these medications:  Tramadol (Ultram)  **Note: The testing scope of this panel does not include the  following reported medications:  Albuterol  Albuterol (Duoneb)  Atenolol  Buspirone  Calcium  Dimenhydrinate  Eye Drop  Fluticasone (Advair)  Hydroxyzine (Vistaril)  Ipratropium (Duoneb)  Lisinopril (Zestril)  Lubiprostone (Amitiza)  Magnesium  Mirabegron (Myrbetriq)  Montelukast (Singulair)  Omeprazole (Prilosec)  Ondansetron (Zofran)  Paliperidone (Invega)  Polyethylene  Glycol (MiraLAX)  Prazosin (Minipress)  Salmeterol (Advair)  Sertraline (Zoloft)  Supplement  Trazodone (Desyrel)  Vitamin D ==================================================================== For clinical consultation, please call [phone]. ====================================================================      ROS  Constitutional: Denies any fever or chills Gastrointestinal: No reported hemesis, hematochezia, vomiting, or acute GI distress Musculoskeletal: Denies any acute onset joint swelling, redness, loss of ROM, or weakness Neurological: No reported episodes of acute onset apraxia, aphasia, dysarthria, agnosia, amnesia, paralysis, loss of coordination, or loss of consciousness  Medication Review  Benefiber, Fluticasone-Salmeterol, Magnesium, albuterol, atenolol, b complex vitamins, busPIRone, dimenhyDRINATE, hydrOXYzine, ipratropium-albuterol, latanoprost, lisinopril, loratadine, lubiprostone, meloxicam, mirabegron ER, montelukast, omeprazole, ondansetron, paliperidone, polyethylene glycol, prazosin, sertraline, traMADol, and traZODone  History Review  Allergy: Ms. Vowles is allergic to latex, penicillins, codeine, cortisone, and sulfa antibiotics. Drug: Ms. Korber  reports no history of drug use. Alcohol:  reports no history of alcohol use. Tobacco:  reports that she quit smoking about 30 years ago. Her smoking use included cigarettes. She has a 30.00 pack-year smoking history. She has never used smokeless tobacco. Social: Ms. Vary  reports that she quit smoking about 30 years ago. Her smoking use included cigarettes. She has a 30.00 pack-year smoking history. She has never used smokeless tobacco. She reports that she does not drink alcohol and does not use drugs. Medical:  has a past medical history of Anemia, Anxiety, Arthritis, Asthma, Depression, GERD (gastroesophageal reflux disease), Hepatitis-C, History of chemotherapy, Hypertension, Insomnia,  Lymphoma ([place]) (12/2013), Personal history of non-Hodgkin lymphomas, and Vitamin D deficiency. Surgical: Ms. Vaquerano  has a past surgical history that includes Appendectomy; Trigger finger release; Esophagogastroduodenoscopy (01/17/2011); Colonoscopy (01/17/2011); Abdominal hysterectomy; and Breast biopsy (Left, 2015). Family: family history includes Allergies in her mother; Asthma in her mother and sister; Breast cancer in her cousin; Colon cancer in her paternal aunt; Diabetes in her maternal grandfather and maternal grandmother; Heart disease in her brother, father, paternal grandfather, and paternal grandmother; Liver cancer in her paternal uncle; Ovarian cancer in an other family member; Pancreatic cancer in an other family member.  Laboratory Chemistry Profile   Renal Lab Results  Component Value Date   BUN 7 (L) 03/25/2020   CREATININE 0.77 03/25/2020   BCR 9 (L) 03/25/2020   GFRAA >60 08/05/2019   GFRNONAA >60 08/05/2019     Hepatic Lab Results  Component Value Date   AST 14 03/25/2020   ALT 11 03/25/2020   ALBUMIN 4.1 03/25/2020   ALKPHOS 96 03/25/2020   HCVAB REACTIVE (A) 01/12/2014   LIPASE 34 10/31/2010     Electrolytes Lab Results  Component Value Date   NA 143 03/25/2020   K 5.2 03/25/2020   CL 104 03/25/2020   CALCIUM 9.5 03/25/2020   MG 2.2 08/29/2017     Bone Lab Results  Component Value Date   25OHVITD1 21 (L) 08/29/2017   25OHVITD2 <1.0 08/29/2017   25OHVITD3 21 08/29/2017  Inflammation (CRP: Acute Phase) (ESR: Chronic Phase) Lab Results  Component Value Date   CRP 12 (H) 08/29/2017   ESRSEDRATE 46 (H) 08/29/2017       Note: Above Lab results reviewed.  Recent Imaging Review  DG PAIN CLINIC C-ARM 1-60 MIN NO REPORT Fluoro was used, but no Radiologist interpretation will be provided.  Please refer to "NOTES" tab for provider progress note. Note: Reviewed        Physical Exam  General appearance: Well nourished, well developed, and well  hydrated. In no apparent acute distress Mental status: Alert, oriented x 3 (person, place, & time)       Respiratory: No evidence of acute respiratory distress Eyes: PERLA Vitals: BP (!) 136/57   Pulse 89   Temp (!) 97.1 F (36.2 C) (Temporal)   Resp 16   Ht $R'5\' 5"'Nj$  (1.651 m)   Wt 181 lb (82.1 kg)   SpO2 100%   BMI 30.12 kg/m  BMI: Estimated body mass index is 30.12 kg/m as calculated from the following:   Height as of this encounter: $RemoveBeforeD'5\' 5"'UFwhWCpDyVdOCD$  (1.651 m).   Weight as of this encounter: 181 lb (82.1 kg). Ideal: Ideal body weight: 57 kg (125 lb 10.6 oz) Adjusted ideal body weight: 67 kg (147 lb 12.8 oz)  Assessment   Status Diagnosis  Controlled Worsening Worsening 1. Chronic pain syndrome   2. Chronic shoulder pain (Right)   3. Arthralgia of shoulder region (Right)   4. Cervical radiculopathy at C6 (Right)   5. Cervicalgia   6. Chronic low back pain (1ry area of Pain) (Bilateral) (L>R) w/o sciatica   7. Pharmacologic therapy   8. Chronic use of opiate for therapeutic purpose   9. Uncomplicated opioid dependence ([place])   10. Musculoskeletal disorder involving upper trapezius muscle (Right)   11. Chronic musculoskeletal pain      Updated Problems: Problem  Musculoskeletal disorder involving upper trapezius muscle (Right)    Plan of Care  Problem-specific:  No problem-specific Assessment & Plan notes found for this encounter.  Ms. ROSALI AUGELLO has a current medication list which includes the following long-term medication(s): albuterol, atenolol, fluticasone-salmeterol, ipratropium-albuterol, lisinopril, loratadine, loratadine, lubiprostone, magnesium, meloxicam, mirabegron er, montelukast, omeprazole, paliperidone, prazosin, sertraline, tramadol, trazodone, and benefiber.  Pharmacotherapy (Medications Ordered): Meds ordered this encounter  Medications  . traMADol (ULTRAM) 50 MG tablet    Sig: Take 2 tablets (100 mg total) by mouth every 6 (six) hours as needed for  severe pain. Must last 30 days    Dispense:  240 tablet    Refill:  2    Chronic Pain: STOP Act (Not applicable) Fill 1 day early if closed on refill date. Avoid benzodiazepines within 8 hours of opioids   Orders:  Orders Placed This Encounter  Procedures  . SHOULDER INJECTION    Standing Status:   Future    Standing Expiration Date:   06/07/2020    Scheduling Instructions:     Procedure: Intra-articular shoulder (Glenohumeral) joint and (AC) Acromioclavicular joint injection     Side: Right-sided     Level: Glenohumeral joint and (AC) Acromioclavicular joint     Sedation: Patient's choice.     Timeframe: As permitted by the schedule    Order Specific Question:   Where will this procedure be performed?    Answer:   [facility]  . TRIGGER POINT INJECTION    Standing Status:   Future    Standing Expiration Date:   07/08/2020  Scheduling Instructions:     Area: Neck (right trapezius)     Side: Right     Sedation: With Sedation.     Timeframe: ASAP    Order Specific Question:   Where will this procedure be performed?    Answer:   [facility]   Follow-up plan:   Return for Procedure (w/ sedation): (R) Shoulder inj + MNB (Trapezius).      Interventional Therapies  Risk  Complexity Considerations:   Estimated body mass index is 30.12 kg/m as calculated from the following:   Height as of this encounter: $RemoveBeforeD'5\' 5"'sWTkoVVVqfGfGC$  (1.651 m).   Weight as of this encounter: 181 lb (82.1 kg). NOTE: LATEX ALLERGY & Hx Hepatitis C (w/ cirrhosis)   Planned  Pending:   Therapeutic right IA shoulder joint and AC joint injection #2 + right trapezius muscle TPI #1  Evaluation of left hip area pain    Under consideration:   Diagnostic left LESI Possible bilateral lumbar facetRFA Diagnostic bilateral thoracic facet block Possiblebilateral thoracic facetRFA Possible bilateral cervical facetRFA Diagnostic bilateral GONB Diagnostic trigger point injections   Completed:    Diagnostic right shoulder injection x1  Palliative/diagnosticbilateral lumbar facet blockx3(100/100/90/90) Palliative/diagnostic bilateralcervicalfacet blockx1   Therapeutic  Palliative (PRN) options:   Diagnostic right shoulder injection #2  Palliative/diagnosticbilateral lumbar facet block#4 Palliative/diagnostic bilateralcervicalfacet block#2    Recent Visits No visits were found meeting these conditions. Showing recent visits within past 90 days and meeting all other requirements Today's Visits Date Type Provider Dept  04/07/20 Office Visit Milinda Pointer, MD [facility]  Showing today's visits and meeting all other requirements Future Appointments No visits were found meeting these conditions. Showing future appointments within next 90 days and meeting all other requirements  I discussed the assessment and treatment plan with the patient. The patient was provided an opportunity to ask questions and all were answered. The patient agreed with the plan and demonstrated an understanding of the instructions.  Patient advised to call back or seek an in-person evaluation if the symptoms or condition worsens.  Duration of encounter: 30 minutes.  Note by: Gaspar Cola, MD Date: 04/07/2020; Time: 8:33 AM

## 2020-04-07 ENCOUNTER — Ambulatory Visit: Payer: Medicare Other | Attending: Pain Medicine | Admitting: Pain Medicine

## 2020-04-07 ENCOUNTER — Other Ambulatory Visit: Payer: Self-pay

## 2020-04-07 ENCOUNTER — Encounter: Payer: Self-pay | Admitting: Pain Medicine

## 2020-04-07 VITALS — BP 136/57 | HR 89 | Temp 97.1°F | Resp 16 | Ht 65.0 in | Wt 181.0 lb

## 2020-04-07 DIAGNOSIS — M25511 Pain in right shoulder: Secondary | ICD-10-CM | POA: Insufficient documentation

## 2020-04-07 DIAGNOSIS — M5412 Radiculopathy, cervical region: Secondary | ICD-10-CM | POA: Diagnosis not present

## 2020-04-07 DIAGNOSIS — M545 Low back pain, unspecified: Secondary | ICD-10-CM | POA: Insufficient documentation

## 2020-04-07 DIAGNOSIS — M542 Cervicalgia: Secondary | ICD-10-CM | POA: Insufficient documentation

## 2020-04-07 DIAGNOSIS — Z79891 Long term (current) use of opiate analgesic: Secondary | ICD-10-CM | POA: Diagnosis present

## 2020-04-07 DIAGNOSIS — F1120 Opioid dependence, uncomplicated: Secondary | ICD-10-CM | POA: Insufficient documentation

## 2020-04-07 DIAGNOSIS — M7918 Myalgia, other site: Secondary | ICD-10-CM | POA: Diagnosis present

## 2020-04-07 DIAGNOSIS — M629 Disorder of muscle, unspecified: Secondary | ICD-10-CM | POA: Insufficient documentation

## 2020-04-07 DIAGNOSIS — Z79899 Other long term (current) drug therapy: Secondary | ICD-10-CM | POA: Insufficient documentation

## 2020-04-07 DIAGNOSIS — G8929 Other chronic pain: Secondary | ICD-10-CM | POA: Insufficient documentation

## 2020-04-07 DIAGNOSIS — G894 Chronic pain syndrome: Secondary | ICD-10-CM | POA: Diagnosis present

## 2020-04-07 MED ORDER — TRAMADOL HCL 50 MG PO TABS: 100.0000 mg | ORAL_TABLET | Freq: Four times a day (QID) | ORAL | 2 refills | Status: DC | PRN

## 2020-04-07 NOTE — Patient Instructions (Signed)
____________________________________________________________________________________________  Preparing for Procedure with Sedation  Procedure appointments are limited to planned procedures: . No Prescription Refills. . No disability issues will be discussed. . No medication changes will be discussed.  Instructions: . Oral Intake: Do not eat or drink anything for at least 8 hours prior to your procedure. (Exception: Blood Pressure Medication. See below.) . Transportation: Unless otherwise stated by your physician, you may drive yourself after the procedure. . Blood Pressure Medicine: Do not forget to take your blood pressure medicine with a sip of water the morning of the procedure. If your Diastolic (lower reading)is above 100 mmHg, elective cases will be cancelled/rescheduled. . Blood thinners: These will need to be stopped for procedures. Notify our staff if you are taking any blood thinners. Depending on which one you take, there will be specific instructions on how and when to stop it. . Diabetics on insulin: Notify the staff so that you can be scheduled 1st case in the morning. If your diabetes requires high dose insulin, take only  of your normal insulin dose the morning of the procedure and notify the staff that you have done so. . Preventing infections: Shower with an antibacterial soap the morning of your procedure. . Build-up your immune system: Take 1000 mg of Vitamin C with every meal (3 times a day) the day prior to your procedure. . Antibiotics: Inform the staff if you have a condition or reason that requires you to take antibiotics before dental procedures. . Pregnancy: If you are pregnant, call and cancel the procedure. . Sickness: If you have a cold, fever, or any active infections, call and cancel the procedure. . Arrival: You must be in the facility at least 30 minutes prior to your scheduled procedure. . Children: Do not bring children with you. . Dress appropriately:  Bring dark clothing that you would not mind if they get stained. . Valuables: Do not bring any jewelry or valuables.  Reasons to call and reschedule or cancel your procedure: (Following these recommendations will minimize the risk of a serious complication.) . Surgeries: Avoid having procedures within 2 weeks of any surgery. (Avoid for 2 weeks before or after any surgery). . Flu Shots: Avoid having procedures within 2 weeks of a flu shots or . (Avoid for 2 weeks before or after immunizations). . Barium: Avoid having a procedure within 7-10 days after having had a radiological study involving the use of radiological contrast. (Myelograms, Barium swallow or enema study). . Heart attacks: Avoid any elective procedures or surgeries for the initial 6 months after a "Myocardial Infarction" (Heart Attack). . Blood thinners: It is imperative that you stop these medications before procedures. Let us know if you if you take any blood thinner.  . Infection: Avoid procedures during or within two weeks of an infection (including chest colds or gastrointestinal problems). Symptoms associated with infections include: Localized redness, fever, chills, night sweats or profuse sweating, burning sensation when voiding, cough, congestion, stuffiness, runny nose, sore throat, diarrhea, nausea, vomiting, cold or Flu symptoms, recent or current infections. It is specially important if the infection is over the area that we intend to treat. . Heart and lung problems: Symptoms that may suggest an active cardiopulmonary problem include: cough, chest pain, breathing difficulties or shortness of breath, dizziness, ankle swelling, uncontrolled high or unusually low blood pressure, and/or palpitations. If you are experiencing any of these symptoms, cancel your procedure and contact your primary care physician for an evaluation.  Remember:  Regular Business hours are:    Monday to Thursday 8:00 AM to 4:00 PM  Provider's  Schedule: Milinda Pointer, MD:  Procedure days: Tuesday and Thursday 7:30 AM to 4:00 PM  Gillis Santa, MD:  Procedure days: Monday and Wednesday 7:30 AM to 4:00 PM ____________________________________________________________________________________________   ____________________________________________________________________________________________  Medication Rules  Purpose: To inform patients, and their family members, of our rules and regulations.  Applies to: All patients receiving prescriptions (written or electronic).  Pharmacy of record: Pharmacy where electronic prescriptions will be sent. If written prescriptions are taken to a different pharmacy, please inform the nursing staff. The pharmacy listed in the electronic medical record should be the one where you would like electronic prescriptions to be sent.  Electronic prescriptions: In compliance with the [place] (STOP) Act of 2017 (Session Lanny Cramp [phone]), effective January 09, 2018, all controlled substances must be electronically prescribed. Calling prescriptions to the pharmacy will cease to exist.  Prescription refills: Only during scheduled appointments. Applies to all prescriptions.  NOTE: The following applies primarily to controlled substances (Opioid* Pain Medications).   Type of encounter (visit): For patients receiving controlled substances, face-to-face visits are required. (Not an option or up to the patient.)  Patient's responsibilities: 1. Pain Pills: Bring all pain pills to every appointment (except for procedure appointments). 2. Pill Bottles: Bring pills in original pharmacy bottle. Always bring the newest bottle. Bring bottle, even if empty. 3. Medication refills: You are responsible for knowing and keeping track of what medications you take and those you need refilled. The day before your appointment: write a list of all prescriptions that need to be  refilled. The day of the appointment: give the list to the admitting nurse. Prescriptions will be written only during appointments. No prescriptions will be written on procedure days. If you forget a medication: it will not be "Called in", "Faxed", or "electronically sent". You will need to get another appointment to get these prescribed. No early refills. Do not call asking to have your prescription filled early. 4. Prescription Accuracy: You are responsible for carefully inspecting your prescriptions before leaving our office. Have the discharge nurse carefully go over each prescription with you, before taking them home. Make sure that your name is accurately spelled, that your address is correct. Check the name and dose of your medication to make sure it is accurate. Check the number of pills, and the written instructions to make sure they are clear and accurate. Make sure that you are given enough medication to last until your next medication refill appointment. 5. Taking Medication: Take medication as prescribed. When it comes to controlled substances, taking less pills or less frequently than prescribed is permitted and encouraged. Never take more pills than instructed. Never take medication more frequently than prescribed.  6. Inform other Doctors: Always inform, all of your healthcare providers, of all the medications you take. 7. Pain Medication from other Providers: You are not allowed to accept any additional pain medication from any other Doctor or Healthcare provider. There are two exceptions to this rule. (see below) In the event that you require additional pain medication, you are responsible for notifying us, as stated below. 8. Cough Medicine: Often these contain an opioid, such as codeine or hydrocodone. Never accept or take cough medicine containing these opioids if you are already taking an opioid* medication. The combination may cause respiratory failure and death. 9. Medication  Agreement: You are responsible for carefully reading and following our Medication Agreement. This must be signed before receiving any  prescriptions from our practice. Safely store a copy of your signed Agreement. Violations to the Agreement will result in no further prescriptions. (Additional copies of our Medication Agreement are available upon request.) 10. Laws, Rules, & Regulations: All patients are expected to follow all Federal and [employer], [employer], Rules, [employer]. Ignorance of the Laws does not constitute a valid excuse.  11. Illegal drugs and Controlled Substances: The use of illegal substances (including, but not limited to marijuana and its derivatives) and/or the illegal use of any controlled substances is strictly prohibited. Violation of this rule may result in the immediate and permanent discontinuation of any and all prescriptions being written by our practice. The use of any illegal substances is prohibited. 12. Adopted CDC guidelines & recommendations: Target dosing levels will be at or below 60 MME/day. Use of benzodiazepines** is not recommended.  Exceptions: There are only two exceptions to the rule of not receiving pain medications from other Healthcare Providers. 1. Exception #1 (Emergencies): In the event of an emergency (i.e.: accident requiring emergency care), you are allowed to receive additional pain medication. However, you are responsible for: As soon as you are able, call our office [phone], at any time of the day or night, and leave a message stating your name, the date and nature of the emergency, and the name and dose of the medication prescribed. In the event that your call is answered by a member of our staff, make sure to document and save the date, time, and the name of the person that took your information.  2. Exception #2 (Planned Surgery): In the event that you are scheduled by another doctor or dentist to have any type of surgery or procedure, you  are allowed (for a period no longer than 30 days), to receive additional pain medication, for the acute post-op pain. However, in this case, you are responsible for picking up a copy of our "Post-op Pain Management for Surgeons" handout, and giving it to your surgeon or dentist. This document is available at our office, and does not require an appointment to obtain it. Simply go to our office during business hours (Monday-Thursday from 8:00 AM to 4:00 PM) (Friday 8:00 AM to 12:00 Noon) or if you have a scheduled appointment with [place], prior to your surgery, and ask for it by name. In addition, you are responsible for: calling our office [phone], at any time of the day or night, and leaving a message stating your name, name of your surgeon, type of surgery, and date of procedure or surgery. Failure to comply with your responsibilities may result in termination of therapy involving the controlled substances.  *Opioid medications include: morphine, codeine, oxycodone, oxymorphone, hydrocodone, hydromorphone, meperidine, tramadol, tapentadol, buprenorphine, fentanyl, methadone. **Benzodiazepine medications include: diazepam (Valium), alprazolam (Xanax), clonazepam (Klonopine), lorazepam (Ativan), clorazepate (Tranxene), chlordiazepoxide (Librium), estazolam (Prosom), oxazepam (Serax), temazepam (Restoril), triazolam (Halcion) (Last updated: 12/08/2019) ____________________________________________________________________________________________   ____________________________________________________________________________________________  Medication Recommendations and Reminders  Applies to: All patients receiving prescriptions (written and/or electronic).  Medication Rules & Regulations: These rules and regulations exist for your safety and that of others. They are not flexible and neither are we. Dismissing or ignoring them will be considered "non-compliance" with medication therapy, resulting in  complete and irreversible termination of such therapy. (See document titled "Medication Rules" for more details.) In all conscience, because of safety reasons, we cannot continue providing a therapy where the patient does not follow instructions.  Pharmacy of record:   Definition: This is the  pharmacy where your electronic prescriptions will be sent.   We do not endorse any particular pharmacy, however, we have experienced problems with [facility] not securing enough medication supply for the community.  We do not restrict you in your choice of pharmacy. However, once we write for your prescriptions, we will NOT be re-sending more prescriptions to fix restricted supply problems created by your pharmacy, or your insurance.   The pharmacy listed in the electronic medical record should be the one where you want electronic prescriptions to be sent.  If you choose to change pharmacy, simply notify our nursing staff.  Recommendations:  Keep all of your pain medications in a safe place, under lock and key, even if you live alone. We will NOT replace lost, stolen, or damaged medication.  After you fill your prescription, take 1 week's worth of pills and put them away in a safe place. You should keep a separate, properly labeled bottle for this purpose. The remainder should be kept in the original bottle. Use this as your primary supply, until it runs out. Once it's gone, then you know that you have 1 week's worth of medicine, and it is time to come in for a prescription refill. If you do this correctly, it is unlikely that you will ever run out of medicine.  To make sure that the above recommendation works, it is very important that you make sure your medication refill appointments are scheduled at least 1 week before you run out of medicine. To do this in an effective manner, make sure that you do not leave the office without scheduling your next medication management appointment. Always ask the nursing  staff to show you in your prescription , when your medication will be running out. Then arrange for the receptionist to get you a return appointment, at least 7 days before you run out of medicine. Do not wait until you have 1 or 2 pills left, to come in. This is very poor planning and does not take into consideration that we may need to cancel appointments due to bad weather, sickness, or emergencies affecting our staff.  DO NOT ACCEPT A "Partial Fill": If for any reason your pharmacy does not have enough pills/tablets to completely fill or refill your prescription, do not allow for a "partial fill". The law allows the pharmacy to complete that prescription within 72 hours, without requiring a new prescription. If they do not fill the rest of your prescription within those 72 hours, you will need a separate prescription to fill the remaining amount, which we will NOT provide. If the reason for the partial fill is your insurance, you will need to talk to the pharmacist about payment alternatives for the remaining tablets, but again, DO NOT ACCEPT A PARTIAL FILL, unless you can trust your pharmacist to obtain the remainder of the pills within 72 hours.  Prescription refills and/or changes in medication(s):   Prescription refills, and/or changes in dose or medication, will be conducted only during scheduled medication management appointments. (Applies to both, written and electronic prescriptions.)  No refills on procedure days. No medication will be changed or started on procedure days. No changes, adjustments, and/or refills will be conducted on a procedure day. Doing so will interfere with the diagnostic portion of the procedure.  No phone refills. No medications will be "called into the pharmacy".  No Fax refills.  No weekend refills.  No Holliday refills.  No after hours refills.  Remember:  Business hours are:  Monday to Thursday 8:00 AM to 4:00 PM Provider's Schedule: Milinda Pointer,  MD - Appointments are:  Medication management: Monday and Wednesday 8:00 AM to 4:00 PM Procedure day: Tuesday and Thursday 7:30 AM to 4:00 PM Gillis Santa, MD - Appointments are:  Medication management: Tuesday and Thursday 8:00 AM to 4:00 PM Procedure day: Monday and Wednesday 7:30 AM to 4:00 PM (Last update: 07/30/2019) ____________________________________________________________________________________________   ____________________________________________________________________________________________  CBD (cannabidiol) WARNING  Applicable to: All individuals currently taking or considering taking CBD (cannabidiol) and, more important, all patients taking opioid analgesic controlled substances (pain medication). (Example: oxycodone; oxymorphone; hydrocodone; hydromorphone; morphine; methadone; tramadol; tapentadol; fentanyl; buprenorphine; butorphanol; dextromethorphan; meperidine; codeine; etc.)  Legal status: CBD remains a Schedule I drug prohibited for any use. CBD is illegal with one exception. In the [place], CBD has a limited Transport planner (FDA) approval for the treatment of two specific types of epilepsy disorders. Only one CBD product has been approved by the FDA for this purpose: "Epidiolex". FDA is aware that some companies are marketing products containing cannabis and cannabis-derived compounds in ways that violate the [employer], Drug and Cosmetic Act [facility] Act) and that may put the health and safety of consumers at risk. The FDA, a Federal agency, has not enforced the CBD status since 2018.   Legality: Some manufacturers ship CBD products nationally, which is illegal. Often such products are sold online and are therefore available throughout the country. CBD is openly sold in head shops and health food stores in some states where such sales have not been explicitly legalized. Selling unapproved products with unsubstantiated therapeutic claims is not only a  violation of the law, but also can put patients at risk, as these products have not been proven to be safe or effective. Federal illegality makes it difficult to conduct research on CBD.  Reference: "FDA Regulation of Cannabis and Cannabis-Derived Products, Including Cannabidiol (CBD)" - [employer]  Warning: CBD is not FDA approved and has not undergo the same manufacturing controls as prescription drugs.  This means that the purity and safety of available CBD may be questionable. Most of the time, despite manufacturer's claims, it is contaminated with THC (delta-9-tetrahydrocannabinol - the chemical in marijuana responsible for the "HIGH").  When this is the case, the [facility] contaminant will trigger a positive urine drug screen (UDS) test for Marijuana (carboxy-THC). Because a positive UDS for any illicit substance is a violation of our medication agreement, your opioid analgesics (pain medicine) may be permanently discontinued.  MORE ABOUT CBD  General Information: CBD  is a derivative of the Marijuana (cannabis sativa) plant discovered in 35. It is one of the 113 identified substances found in Marijuana. It accounts for up to 40% of the plant's extract. As of 2018, preliminary clinical studies on CBD included research for the treatment of anxiety, movement disorders, and pain. CBD is available and consumed in multiple forms, including inhalation of smoke or vapor, as an aerosol spray, and by mouth. It may be supplied as an oil containing CBD, capsules, dried cannabis, or as a liquid solution. CBD is thought not to be as psychoactive as THC (delta-9-tetrahydrocannabinol - the chemical in marijuana responsible for the "HIGH"). Studies suggest that CBD may interact with different biological target receptors in the body, including cannabinoid and other neurotransmitter receptors. As of 2018  the mechanism of action for its biological effects has not been determined.  Side-effects  Adverse reactions: Dry mouth, diarrhea, decreased appetite, fatigue, drowsiness, malaise, weakness, sleep  disturbances, and others.  Drug interactions: CBC may interact with other medications such as blood-thinners. (Last update: 08/16/2019) ____________________________________________________________________________________________

## 2020-04-07 NOTE — Progress Notes (Signed)
Nursing Pain Medication Assessment:  Safety precautions to be maintained throughout the outpatient stay will include: orient to surroundings, keep bed in low position, maintain call bell within reach at all times, provide assistance with transfer out of bed and ambulation.  Medication Inspection Compliance: Pill count conducted under aseptic conditions, in front of the patient. Neither the pills nor the bottle was removed from the patient's sight at any time. Once count was completed pills were immediately returned to the patient in their original bottle.  Medication: Tramadol (Ultram) Pill/Patch Count: 0 of 240 pills remain Pill/Patch Appearance: Markings consistent with prescribed medication Bottle Appearance: Standard pharmacy container. Clearly labeled. Filled Date: 01/20/2020 Last Medication intake:  Today

## 2020-04-13 ENCOUNTER — Ambulatory Visit: Payer: Medicare Other | Admitting: Pain Medicine

## 2020-04-15 ENCOUNTER — Other Ambulatory Visit: Payer: Self-pay

## 2020-04-15 ENCOUNTER — Encounter: Payer: Self-pay | Admitting: Pain Medicine

## 2020-04-15 ENCOUNTER — Ambulatory Visit (HOSPITAL_BASED_OUTPATIENT_CLINIC_OR_DEPARTMENT_OTHER): Payer: Medicare Other | Admitting: Pain Medicine

## 2020-04-15 ENCOUNTER — Ambulatory Visit
Admission: RE | Admit: 2020-04-15 | Discharge: 2020-04-15 | Disposition: A | Discharge status: Home / Self Care | Payer: Medicare Other | Source: Ambulatory Visit | Attending: Pain Medicine | Admitting: Pain Medicine

## 2020-04-15 VITALS — BP 132/58 | HR 91 | Temp 97.2°F | Resp 21 | Ht 64.0 in | Wt 181.0 lb

## 2020-04-15 DIAGNOSIS — M778 Other enthesopathies, not elsewhere classified: Secondary | ICD-10-CM | POA: Insufficient documentation

## 2020-04-15 DIAGNOSIS — Z91040 Latex allergy status: Secondary | ICD-10-CM | POA: Diagnosis present

## 2020-04-15 DIAGNOSIS — M542 Cervicalgia: Secondary | ICD-10-CM | POA: Insufficient documentation

## 2020-04-15 DIAGNOSIS — M7581 Other shoulder lesions, right shoulder: Secondary | ICD-10-CM

## 2020-04-15 DIAGNOSIS — M25511 Pain in right shoulder: Secondary | ICD-10-CM

## 2020-04-15 DIAGNOSIS — M792 Neuralgia and neuritis, unspecified: Secondary | ICD-10-CM | POA: Insufficient documentation

## 2020-04-15 DIAGNOSIS — R209 Unspecified disturbances of skin sensation: Secondary | ICD-10-CM | POA: Insufficient documentation

## 2020-04-15 DIAGNOSIS — R937 Abnormal findings on diagnostic imaging of other parts of musculoskeletal system: Secondary | ICD-10-CM | POA: Insufficient documentation

## 2020-04-15 DIAGNOSIS — M629 Disorder of muscle, unspecified: Secondary | ICD-10-CM

## 2020-04-15 DIAGNOSIS — G8929 Other chronic pain: Secondary | ICD-10-CM | POA: Insufficient documentation

## 2020-04-15 DIAGNOSIS — M797 Fibromyalgia: Secondary | ICD-10-CM

## 2020-04-15 DIAGNOSIS — M4802 Spinal stenosis, cervical region: Secondary | ICD-10-CM | POA: Diagnosis present

## 2020-04-15 DIAGNOSIS — R208 Other disturbances of skin sensation: Secondary | ICD-10-CM | POA: Diagnosis present

## 2020-04-15 DIAGNOSIS — M7918 Myalgia, other site: Secondary | ICD-10-CM

## 2020-04-15 DIAGNOSIS — M5412 Radiculopathy, cervical region: Secondary | ICD-10-CM | POA: Insufficient documentation

## 2020-04-15 MED ORDER — FENTANYL CITRATE (PF) 100 MCG/2ML IJ SOLN
25.0000 ug | INTRAMUSCULAR | Status: DC | PRN
  Administered 2020-04-15: 50 ug via INTRAVENOUS
  Filled 2020-04-15: qty 2

## 2020-04-15 MED ORDER — ROPIVACAINE HCL 2 MG/ML IJ SOLN
4.0000 mL | Freq: Once | INTRAMUSCULAR | Status: AC
  Administered 2020-04-15: 4 mL
  Filled 2020-04-15: qty 10

## 2020-04-15 MED ORDER — TRIAMCINOLONE ACETONIDE 40 MG/ML IJ SUSP
40.0000 mg | Freq: Once | INTRAMUSCULAR | Status: AC
Start: 2020-04-15 — End: 2020-04-15
  Administered 2020-04-15: 40 mg
  Filled 2020-04-15: qty 1

## 2020-04-15 MED ORDER — GABAPENTIN 100 MG PO CAPS: ORAL_CAPSULE | ORAL | 0 refills | Status: DC

## 2020-04-15 MED ORDER — METHYLPREDNISOLONE ACETATE 80 MG/ML IJ SUSP
80.0000 mg | Freq: Once | INTRAMUSCULAR | Status: AC
  Administered 2020-04-15: 80 mg via INTRA_ARTICULAR
  Filled 2020-04-15: qty 1

## 2020-04-15 MED ORDER — ROPIVACAINE HCL 2 MG/ML IJ SOLN
9.0000 mL | Freq: Once | INTRAMUSCULAR | Status: AC
  Administered 2020-04-15: 9 mL via INTRA_ARTICULAR
  Filled 2020-04-15: qty 10

## 2020-04-15 MED ORDER — MIDAZOLAM HCL 5 MG/5ML IJ SOLN
1.0000 mg | INTRAMUSCULAR | Status: DC | PRN
  Administered 2020-04-15: 1 mg via INTRAVENOUS
  Filled 2020-04-15: qty 5

## 2020-04-15 MED ORDER — LIDOCAINE HCL 2 % IJ SOLN
20.0000 mL | Freq: Once | INTRAMUSCULAR | Status: AC
  Administered 2020-04-15: 400 mg
  Filled 2020-04-15: qty 40

## 2020-04-15 MED ORDER — LACTATED RINGERS IV SOLN
1000.0000 mL | Freq: Once | INTRAVENOUS | Status: AC
  Administered 2020-04-15: 1000 mL via INTRAVENOUS

## 2020-04-15 NOTE — Patient Instructions (Signed)
____________________________________________________________________________________________  Post-Procedure Discharge Instructions  Instructions:  Apply ice:   Purpose: This will minimize any swelling and discomfort after procedure.   When: Day of procedure, as soon as you get home.  How: Fill a plastic sandwich bag with crushed ice. Cover it with a small towel and apply to injection site.  How long: (15 min on, 15 min off) Apply for 15 minutes then remove x 15 minutes.  Repeat sequence on day of procedure, until you go to bed.  Apply heat:   Purpose: To treat any soreness and discomfort from the procedure.  When: Starting the next day after the procedure.  How: Apply heat to procedure site starting the day following the procedure.  How long: May continue to repeat daily, until discomfort goes away.  Food intake: Start with clear liquids (like water) and advance to regular food, as tolerated.   Physical activities: Keep activities to a minimum for the first 8 hours after the procedure. After that, then as tolerated.  Driving: If you have received any sedation, be responsible and do not drive. You are not allowed to drive for 24 hours after having sedation.  Blood thinner: (Applies only to those taking blood thinners) You may restart your blood thinner 6 hours after your procedure.  Insulin: (Applies only to Diabetic patients taking insulin) As soon as you can eat, you may resume your normal dosing schedule.  Infection prevention: Keep procedure site clean and dry. Shower daily and clean area with soap and water.  Post-procedure Pain Diary: Extremely important that this be done correctly and accurately. Recorded information will be used to determine the next step in treatment. For the purpose of accuracy, follow these rules:  Evaluate only the area treated. Do not report or include pain from an untreated area. For the purpose of this evaluation, ignore all other areas of pain,  except for the treated area.  After your procedure, avoid taking a long nap and attempting to complete the pain diary after you wake up. Instead, set your alarm clock to go off every hour, on the hour, for the initial 8 hours after the procedure. Document the duration of the numbing medicine, and the relief you are getting from it.  Do not go to sleep and attempt to complete it later. It will not be accurate. If you received sedation, it is likely that you were given a medication that may cause amnesia. Because of this, completing the diary at a later time may cause the information to be inaccurate. This information is needed to plan your care.  Follow-up appointment: Keep your post-procedure follow-up evaluation appointment after the procedure (usually 2 weeks for most procedures, 6 weeks for radiofrequencies). DO NOT FORGET to bring you pain diary with you.   Expect: (What should I expect to see with my procedure?)  From numbing medicine (AKA: Local Anesthetics): Numbness or decrease in pain. You may also experience some weakness, which if present, could last for the duration of the local anesthetic.  Onset: Full effect within 15 minutes of injected.  Duration: It will depend on the type of local anesthetic used. On the average, 1 to 8 hours.   From steroids (Applies only if steroids were used): Decrease in swelling or inflammation. Once inflammation is improved, relief of the pain will follow.  Onset of benefits: Depends on the amount of swelling present. The more swelling, the longer it will take for the benefits to be seen. In some cases, up to 10 days.    Duration: Steroids will stay in the system x 2 weeks. Duration of benefits will depend on multiple posibilities including persistent irritating factors.  Side-effects: If present, they may typically last 2 weeks (the duration of the steroids).  Frequent: Cramps (if they occur, drink Gatorade and take over-the-counter Magnesium 450-500 mg  once to twice a day); water retention with temporary weight gain; increases in blood sugar; decreased immune system response; increased appetite.  Occasional: Facial flushing (red, warm cheeks); mood swings; menstrual changes.  Uncommon: Long-term decrease or suppression of natural hormones; bone thinning. (These are more common with higher doses or more frequent use. This is why we prefer that our patients avoid having any injection therapies in other practices.)   Very Rare: Severe mood changes; psychosis; aseptic necrosis.  From procedure: Some discomfort is to be expected once the numbing medicine wears off. This should be minimal if ice and heat are applied as instructed.  Call if: (When should I call?)  You experience numbness and weakness that gets worse with time, as opposed to wearing off.  New onset bowel or bladder incontinence. (Applies only to procedures done in the spine)  Emergency Numbers:  Durning business hours (Monday - Thursday, 8:00 AM - 4:00 PM) (Friday, 9:00 AM - 12:00 Noon): [phone]  After hours: [phone]  NOTE: If you are having a problem and are unable connect with, or to talk to a provider, then go to your nearest urgent care or emergency department. If the problem is serious and urgent, please call 911. ____________________________________________________________________________________________   ____________________________________________________________________________________________  Preparing for your procedure (without sedation)  Procedure appointments are limited to planned procedures: . No Prescription Refills. . No disability issues will be discussed. . No medication changes will be discussed.  Instructions: . Oral Intake: Do not eat or drink anything for at least 6 hours prior to your procedure. (Exception: Blood Pressure Medication. See below.) . Transportation: Unless otherwise stated by your physician, you may drive yourself  after the procedure. . Blood Pressure Medicine: Do not forget to take your blood pressure medicine with a sip of water the morning of the procedure. If your Diastolic (lower reading)is above 100 mmHg, elective cases will be cancelled/rescheduled. . Blood thinners: These will need to be stopped for procedures. Notify our staff if you are taking any blood thinners. Depending on which one you take, there will be specific instructions on how and when to stop it. . Diabetics on insulin: Notify the staff so that you can be scheduled 1st case in the morning. If your diabetes requires high dose insulin, take only  of your normal insulin dose the morning of the procedure and notify the staff that you have done so. . Preventing infections: Shower with an antibacterial soap the morning of your procedure.  . Build-up your immune system: Take 1000 mg of Vitamin C with every meal (3 times a day) the day prior to your procedure. Marland Kitchen Antibiotics: Inform the staff if you have a condition or reason that requires you to take antibiotics before dental procedures. . Pregnancy: If you are pregnant, call and cancel the procedure. . Sickness: If you have a cold, fever, or any active infections, call and cancel the procedure. . Arrival: You must be in the facility at least 30 minutes prior to your scheduled procedure. . Children: Do not bring any children with you. . Dress appropriately: Bring dark clothing that you would not mind if they get stained. . Valuables: Do not bring any jewelry  or valuables.  Reasons to call and reschedule or cancel your procedure: (Following these recommendations will minimize the risk of a serious complication.) . Surgeries: Avoid having procedures within 2 weeks of any surgery. (Avoid for 2 weeks before or after any surgery). . Flu Shots: Avoid having procedures within 2 weeks of a flu shots or . (Avoid for 2 weeks before or after immunizations). . Barium: Avoid having a procedure within 7-10  days after having had a radiological study involving the use of radiological contrast. (Myelograms, Barium swallow or enema study). . Heart attacks: Avoid any elective procedures or surgeries for the initial 6 months after a "Myocardial Infarction" (Heart Attack). . Blood thinners: It is imperative that you stop these medications before procedures. Let us know if you if you take any blood thinner.  . Infection: Avoid procedures during or within two weeks of an infection (including chest colds or gastrointestinal problems). Symptoms associated with infections include: Localized redness, fever, chills, night sweats or profuse sweating, burning sensation when voiding, cough, congestion, stuffiness, runny nose, sore throat, diarrhea, nausea, vomiting, cold or Flu symptoms, recent or current infections. It is specially important if the infection is over the area that we intend to treat. Marland Kitchen Heart and lung problems: Symptoms that may suggest an active cardiopulmonary problem include: cough, chest pain, breathing difficulties or shortness of breath, dizziness, ankle swelling, uncontrolled high or unusually low blood pressure, and/or palpitations. If you are experiencing any of these symptoms, cancel your procedure and contact your primary care physician for an evaluation.  Remember:  Regular Business hours are:  Monday to Thursday 8:00 AM to 4:00 PM  Provider's Schedule: Milinda Pointer, MD:  Procedure days: Tuesday and Thursday 7:30 AM to 4:00 PM  Gillis Santa, MD:  Procedure days: Monday and Wednesday 7:30 AM to 4:00 PM ____________________________________________________________________________________________   ____________________________________________________________________________________________  General Risks and Possible Complications  Patient Responsibilities: It is important that you read this as it is part of your informed consent. It is our duty to inform you of the risks and  possible complications associated with treatments offered to you. It is your responsibility as a patient to read this and to ask questions about anything that is not clear or that you believe was not covered in this document.  Patient's Rights: You have the right to refuse treatment. You also have the right to change your mind, even after initially having agreed to have the treatment done. However, under this last option, if you wait until the last second to change your mind, you may be charged for the materials used up to that point.  Introduction: Medicine is not an Chief Strategy Officer. Everything in Medicine, including the lack of treatment(s), carries the potential for danger, harm, or loss (which is by definition: Risk). In Medicine, a complication is a secondary problem, condition, or disease that can aggravate an already existing one. All treatments carry the risk of possible complications. The fact that a side effects or complications occurs, does not imply that the treatment was conducted incorrectly. It must be clearly understood that these can happen even when everything is done following the highest safety standards.  No treatment: You can choose not to proceed with the proposed treatment alternative. The "PRO(s)" would include: avoiding the risk of complications associated with the therapy. The "CON(s)" would include: not getting any of the treatment benefits. These benefits fall under one of three categories: diagnostic; therapeutic; and/or palliative. Diagnostic benefits include: getting information which can ultimately lead to improvement  of the disease or symptom(s). Therapeutic benefits are those associated with the successful treatment of the disease. Finally, palliative benefits are those related to the decrease of the primary symptoms, without necessarily curing the condition (example: decreasing the pain from a flare-up of a chronic condition, such as incurable terminal cancer).  General  Risks and Complications: These are associated to most interventional treatments. They can occur alone, or in combination. They fall under one of the following six (6) categories: no benefit or worsening of symptoms; bleeding; infection; nerve damage; allergic reactions; and/or death. 1. No benefits or worsening of symptoms: In Medicine there are no guarantees, only probabilities. No healthcare provider can ever guarantee that a medical treatment will work, they can only state the probability that it may. Furthermore, there is always the possibility that the condition may worsen, either directly, or indirectly, as a consequence of the treatment. 2. Bleeding: This is more common if the patient is taking a blood thinner, either prescription or over the counter (example: Goody Powders, Fish oil, Aspirin, Garlic, etc.), or if suffering a condition associated with impaired coagulation (example: Hemophilia, cirrhosis of the liver, low platelet counts, etc.). However, even if you do not have one on these, it can still happen. If you have any of these conditions, or take one of these drugs, make sure to notify your treating physician. 3. Infection: This is more common in patients with a compromised immune system, either due to disease (example: diabetes, cancer, human immunodeficiency virus [HIV], etc.), or due to medications or treatments (example: therapies used to treat cancer and rheumatological diseases). However, even if you do not have one on these, it can still happen. If you have any of these conditions, or take one of these drugs, make sure to notify your treating physician. 4. Nerve Damage: This is more common when the treatment is an invasive one, but it can also happen with the use of medications, such as those used in the treatment of cancer. The damage can occur to small secondary nerves, or to large primary ones, such as those in the spinal cord and brain. This damage may be temporary or permanent and it  may lead to impairments that can range from temporary numbness to permanent paralysis and/or brain death. 5. Allergic Reactions: Any time a substance or material comes in contact with our body, there is the possibility of an allergic reaction. These can range from a mild skin rash (contact dermatitis) to a severe systemic reaction (anaphylactic reaction), which can result in death. 6. Death: In general, any medical intervention can result in death, most of the time due to an unforeseen complication. ____________________________________________________________________________________________

## 2020-04-15 NOTE — Progress Notes (Signed)
PROVIDER NOTE: Information contained herein reflects review and annotations entered in association with encounter. Interpretation of such information and data should be left to medically-trained personnel. Information provided to patient can be located elsewhere in the medical record under "Patient Instructions". Document created using STT-dictation technology, any transcriptional errors that may result from process are unintentional.    Patient: Rachael West  Service Category: Procedure  Provider: Gaspar Cola, MD  DOB: 11/05/1955  DOS: 04/15/2020  Location: [facility]  MRN: [id number]  Setting: Ambulatory - outpatient  Referring Provider: Lorrene Reid, PA-C  Type: Established Patient  Specialty: Interventional Pain Management  PCP: Lorrene Reid, PA-C   Primary Reason for Visit: Interventional Pain Management Treatment. CC: Shoulder Pain (right)  Procedure #1:  Anesthesia, Analgesia, Anxiolysis:  Type: Diagnostic Glenohumeral and acromioclavicular joint Injection #2 (I also injected the subacromial bursa) Primary Purpose: Diagnostic Region: Superior Shoulder Area Level:  Shoulder Target Area: Glenohumeral and acromioclavicular joint Approach: Anterior approach. Laterality: Right  Type: Moderate (Conscious) Sedation combined with Local Anesthesia Indication(s): Analgesia and Anxiety Route: Intravenous (IV) IV Access: Secured Sedation: Meaningful verbal contact was maintained at all times during the procedure  Local Anesthetic: Lidocaine 1-2%  Position: Supine   Indications: 1. Chronic shoulder pain (Right)   2. Shoulder enthesopathy (Right)   3. Tendinitis of rotator cuff (Right)    Procedure #2:    Type: Lateral trapezius Trigger Point Injection (1-2 muscle groups) #1  CPT: 20552 Primary Purpose: Therapeutic Region: Posterolateral Cervicothoracic Level: Cervico-thoracic Target Area: Lateral trapezius Trigger Point Approach: Percutaneous,  ipsilateral approach. Laterality: Right Paraspinal  Position: Sitting   Indications: 1. Musculoskeletal disorder involving upper trapezius muscle (Right)   2. Chronic musculoskeletal pain   3. Latex precautions, history of latex allergy    Pain Score: Pre-procedure: 7 /10 Post-procedure: 0-No pain/10  Soon after the procedure I asked the patient if she could move her right shoulder better and she was able to provide me with full range of motion, which she was unable to do prior to the procedure.  The immediate post procedure evaluation revealed that she still had some discomfort to palpation over the area of the biceps tendon.  Pre-op H&P Assessment:  Rachael West is a 65 y.o. (year old), female patient, seen today for interventional treatment. She  has a past surgical history that includes Appendectomy; Trigger finger release; Esophagogastroduodenoscopy (01/17/2011); Colonoscopy (01/17/2011); Abdominal hysterectomy; and Breast biopsy (Left, 2015). Rachael West has a current medication list which includes the following prescription(s): albuterol, atenolol, b complex vitamins, buspirone, dimenhydrinate, fluticasone-salmeterol, gabapentin, hydroxyzine, hydroxyzine, ipratropium-albuterol, latanoprost, lisinopril, loratadine, loratadine, lubiprostone, magnesium, meloxicam, mirabegron er, montelukast, omeprazole, ondansetron, paliperidone, polyethylene glycol, prazosin, sertraline, tramadol, trazodone, and benefiber, and the following Facility-Administered Medications: fentanyl and midazolam. Her primarily concern today is the Shoulder Pain (right)  Initial Vital Signs:  Pulse/HCG Rate: 91ECG Heart Rate: 80 Temp: (!) 96.8 F (36 C) Resp: 18 BP: 138/77 SpO2: 100 %  BMI: Estimated body mass index is 31.07 kg/m as calculated from the following:   Height as of this encounter: 5\' 4"  (1.626 m).   Weight as of this encounter: 181 lb (82.1 kg).  Risk Assessment: Allergies: Reviewed. She is  allergic to latex, penicillins, codeine, cortisone, and sulfa antibiotics.  Allergy Precautions: None required Coagulopathies: Reviewed. None identified.  Blood-thinner therapy: None at this time Active Infection(s): Reviewed. None identified. Rachael West is afebrile  Site Confirmation: Rachael West was asked to confirm the procedure and laterality before marking the site  Procedure checklist: Completed Consent: Before the procedure and under the influence of no sedative(s), amnesic(s), or anxiolytics, the patient was informed of the treatment options, risks and possible complications. To fulfill our ethical and legal obligations, as recommended by the American Medical Association's Code of Ethics, I have informed the patient of my clinical impression; the nature and purpose of the treatment or procedure; the risks, benefits, and possible complications of the intervention; the alternatives, including doing nothing; the risk(s) and benefit(s) of the alternative treatment(s) or procedure(s); and the risk(s) and benefit(s) of doing nothing. The patient was provided information about the general risks and possible complications associated with the procedure. These may include, but are not limited to: failure to achieve desired goals, infection, bleeding, organ or nerve damage, allergic reactions, paralysis, and death. In addition, the patient was informed of those risks and complications associated to the procedure, such as failure to decrease pain; infection; bleeding; organ or nerve damage with subsequent damage to sensory, motor, and/or autonomic systems, resulting in permanent pain, numbness, and/or weakness of one or several areas of the body; allergic reactions; (i.e.: anaphylactic reaction); and/or death. Furthermore, the patient was informed of those risks and complications associated with the medications. These include, but are not limited to: allergic reactions (i.e.: anaphylactic or  anaphylactoid reaction(s)); adrenal axis suppression; blood sugar elevation that in diabetics may result in ketoacidosis or comma; water retention that in patients with history of congestive heart failure may result in shortness of breath, pulmonary edema, and decompensation with resultant heart failure; weight gain; swelling or edema; medication-induced neural toxicity; particulate matter embolism and blood vessel occlusion with resultant organ, and/or nervous system infarction; and/or aseptic necrosis of one or more joints. Finally, the patient was informed that Medicine is not an exact science; therefore, there is also the possibility of unforeseen or unpredictable risks and/or possible complications that may result in a catastrophic outcome. The patient indicated having understood very clearly. We have given the patient no guarantees and we have made no promises. Enough time was given to the patient to ask questions, all of which were answered to the patient's satisfaction. Ms. Brienza has indicated that she wanted to continue with the procedure. Attestation: I, the ordering provider, attest that I have discussed with the patient the benefits, risks, side-effects, alternatives, likelihood of achieving goals, and potential problems during recovery for the procedure that I have provided informed consent. Date  Time: 04/15/2020  8:50 AM  Pre-Procedure Preparation:  Monitoring: As per clinic protocol. Respiration, ETCO2, SpO2, BP, heart rate and rhythm monitor placed and checked for adequate function Safety Precautions: Patient was assessed for positional comfort and pressure points before starting the procedure. Time-out: I initiated and conducted the "Time-out" before starting the procedure, as per protocol. The patient was asked to participate by confirming the accuracy of the "Time Out" information. Verification of the correct person, site, and procedure were performed and confirmed by me, the  nursing staff, and the patient. "Time-out" conducted as per Joint Commission's Universal Protocol ([email or web address].01.01.01). Time: 0945  Description of Procedure #1:  Area Prepped: Entire shoulder Area DuraPrep (Iodine Povacrylex [0.7% available iodine] and Isopropyl Alcohol, 74% w/w) Safety Precautions: Aspiration looking for blood return was conducted prior to all injections. At no point did we inject any substances, as a needle was being advanced. No attempts were made at seeking any paresthesias. Safe injection practices and needle disposal techniques used. Medications properly checked for expiration dates. SDV (single dose vial) medications used. Description of the  Procedure: Protocol guidelines were followed. The patient was placed in position over the procedure table. The target area was identified and the area prepped in the usual manner. Skin & deeper tissues infiltrated with local anesthetic. Appropriate amount of time allowed to pass for local anesthetics to take effect. The procedure needles were then advanced to the target area. Proper needle placement secured. Negative aspiration confirmed. Solution injected in intermittent fashion, asking for systemic symptoms every 0.5cc of injectate. The needles were then removed and the area cleansed, making sure to leave some of the prepping solution back to take advantage of its long term bactericidal properties.         Vitals:   04/15/20 0955 04/15/20 1004 04/15/20 1014 04/15/20 1023  BP: 137/72 125/81 128/74 (!) 132/58  Pulse:      Resp: 15 18  (!) 21  Temp:  (!) 97.2 F (36.2 C)  (!) 97.2 F (36.2 C)  TempSrc:  Temporal  Temporal  SpO2: 94% 93% 98% 98%  Weight:      Height:        Start Time: 0945 hrs. End Time: 0955 hrs. Materials:  Needle(s) Type: Spinal Needle Gauge: 22G Length: 3.5-in Medication(s): Please see orders for medications and dosing details.  Imaging Guidance (Non-Spinal) for procedure #1:  Type of Imaging Technique:  Fluoroscopy Guidance (Non-Spinal) Indication(s): Assistance in needle guidance and placement for procedures requiring needle placement in or near specific anatomical locations not easily accessible without such assistance. Exposure Time: Please see nurses notes. Contrast: None used. Fluoroscopic Guidance: I was personally present during the use of fluoroscopy. "Tunnel Vision Technique" used to obtain the best possible view of the target area. Parallax error corrected before commencing the procedure. "Direction-depth-direction" technique used to introduce the needle under continuous pulsed fluoroscopy. Once target was reached, antero-posterior, oblique, and lateral fluoroscopic projection used confirm needle placement in all planes. Images permanently stored in EMR. Interpretation: No contrast injected. I personally interpreted the imaging intraoperatively. Adequate needle placement confirmed in multiple planes. Permanent images saved into the patient's record.  Description of Procedure #2:  Area Prepped: Entire             Region DuraPrep (Iodine Povacrylex [0.7% available iodine] and Isopropyl Alcohol, 74% w/w) Safety Precautions: Aspiration looking for blood return was conducted prior to all injections. At no point did we inject any substances, as a needle was being advanced. No attempts were made at seeking any paresthesias. Safe injection practices and needle disposal techniques used. Medications properly checked for expiration dates. SDV (single dose vial) medications used. Description of the Procedure: Protocol guidelines were followed. The patient was placed in position over the fluoroscopy table. The target area was identified and the area prepped in the usual manner. Skin & deeper tissues infiltrated with local anesthetic. Appropriate amount of time allowed to pass for local anesthetics to take effect. The procedure needles were then advanced to the target area. Proper needle placement secured.  Negative aspiration confirmed. Solution injected in intermittent fashion, asking for systemic symptoms every 0.5cc of injectate. The needles were then removed and the area cleansed, making sure to leave some of the prepping solution back to take advantage of its long term bactericidal properties.  Vitals:   04/15/20 0955 04/15/20 1004 04/15/20 1014 04/15/20 1023  BP: 137/72 125/81 128/74 (!) 132/58  Pulse:      Resp: 15 18  (!) 21  Temp:  (!) 97.2 F (36.2 C)  (!) 97.2 F (36.2 C)  TempSrc:  Temporal  Temporal  SpO2: 94% 93% 98% 98%  Weight:      Height:        Start Time: 0945 hrs. End Time: 0955 hrs. Materials:  Needle(s) Type: Regular needle Gauge: 25G Length: 1.5-in Medication(s): Please see orders for medications and dosing details.  Imaging Guidance:          Type of Imaging Technique: None used Indication(s): N/A Exposure Time: No patient exposure Contrast: None used. Fluoroscopic Guidance: N/A Ultrasound Guidance: N/A Interpretation: N/A  Antibiotic Prophylaxis:   Anti-infectives (From admission, onward)   None     Indication(s): None identified  Post-operative Assessment:  Post-procedure Vital Signs:  Pulse/HCG Rate: 9188 Temp: (!) 97.2 F (36.2 C) Resp: (!) 21 BP: (!) 132/58 SpO2: 98 %  EBL: None  Complications: No immediate post-treatment complications observed by team, or reported by patient.  Note: The patient tolerated the entire procedure well. A repeat set of vitals were taken after the procedure and the patient was kept under observation following institutional policy, for this type of procedure. Post-procedural neurological assessment was performed, showing return to baseline, prior to discharge. The patient was provided with post-procedure discharge instructions, including a section on how to identify potential problems. Should any problems arise concerning this procedure, the patient was given instructions to immediately contact us, at any  time, without hesitation. In any case, we plan to contact the patient by telephone for a follow-up status report regarding this interventional procedure.  Comments:  No additional relevant information.  Plan of Care  Orders:  Orders Placed This Encounter  Procedures  . SHOULDER INJECTION    Scheduling Instructions:     Procedure: Intra-articular shoulder (Glenohumeral) joint and (AC) Acromioclavicular joint injection     Side: Right-sided     Level: Glenohumeral joint and (AC) Acromioclavicular joint     Sedation: Patient's choice.     Timeframe: Today    Order Specific Question:   Where will this procedure be performed?    Answer:   [facility]  . TRIGGER POINT INJECTION    Scheduling Instructions:     Area: Upper Back (lateral aspect of trapezius muscle)     Side: Right     Sedation: Patient's choice.     Timeframe: Today    Order Specific Question:   Where will this procedure be performed?    Answer:   [facility]  . Cervical Epidural Injection    Level(s): C7-T1 Laterality: Right-sided Purpose: Diagnostic/Therapeutic Indication(s): Radiculitis and cervicalgia associater with cervical degenerative disc disease.    Standing Status:   Future    Standing Expiration Date:   05/15/2020    Scheduling Instructions:     Procedure: Cervical Epidural Steroid Injection/Block     Sedation: With Sedation.     Timeframe: in 2 weeks    Order Specific Question:   Where will this procedure be performed?    Answer:   [facility]    Comments:   by Dr. Dossie Arbour  . [facility] C-ARM 1-60 MIN NO REPORT    Intraoperative interpretation by procedural physician at [place].    Standing Status:   Standing    Number of Occurrences:   1    Order Specific Question:   Reason for exam:    Answer:   Assistance in needle guidance and placement for procedures requiring needle placement in or near specific anatomical locations not easily accessible without such  assistance.  . Informed Consent Details:  Physician/Practitioner Attestation; Transcribe to consent form and obtain patient signature    Note: Always confirm laterality of pain with Ms. Ahlgren, before procedure.    Order Specific Question:   Physician/Practitioner attestation of informed consent for procedure/surgical case    Answer:   I, the physician/practitioner, attest that I have discussed with the patient the benefits, risks, side effects, alternatives, likelihood of achieving goals and potential problems during recovery for the procedure that I have provided informed consent.    Order Specific Question:   Procedure    Answer:   Intra-articular shoulder joint injection under fluoroscopic guidance    Order Specific Question:   Physician/Practitioner performing the procedure    Answer:   Kingsly Kloepfer A. Dossie Arbour, MD    Order Specific Question:   Indication/Reason    Answer:   Chronic shoulder pain secondary to shoulder arthropathy  . Provide equipment / supplies at bedside    "Block Tray" (Disposable  single use) Needle type: SpinalSpinal Amount/quantity: 1 Size: Regular (3.5-inch) Gauge: 22G    Standing Status:   Standing    Number of Occurrences:   1    Order Specific Question:   Specify    Answer:   Block Tray  . Informed Consent Details: Physician/Practitioner Attestation; Transcribe to consent form and obtain patient signature    Provider Attestation: I, Gil[place]ton Dossie Arbour, MD, (Pain Management Specialist), the physician/practitioner, attest that I have discussed with the patient the benefits, risks, side effects, alternatives, likelihood of achieving goals and potential problems during recovery for the procedure that I have provided informed consent.    Scheduling Instructions:     Note: Always confirm laterality of pain with Ms. Nier, before procedure.     Transcribe to consent form and obtain patient signature.    Order Specific Question:   Physician/Practitioner  attestation of informed consent for procedure/surgical case    Answer:   I, the physician/practitioner, attest that I have discussed with the patient the benefits, risks, side effects, alternatives, likelihood of achieving goals and potential problems during recovery for the procedure that I have provided informed consent.    Order Specific Question:   Procedure    Answer:   Myoneural Block (Trigger Point injection)    Order Specific Question:   Physician/Practitioner performing the procedure    Answer:   Khrystyna Schwalm A. Dossie Arbour MD    Order Specific Question:   Indication/Reason    Answer:   Musculoskeletal pain/myofascial pain secondary to trigger point  . Latex precautions    Activate Latex-Free Protocol.    Standing Status:   Standing    Number of Occurrences:   1   Chronic Opioid Analgesic:  Tramadol50 mg,2tabs (100 mg) PO q 6 hrs (400 mg/day of tramadol) MME/day:40mg /day.   Medications ordered for procedure: Meds ordered this encounter  Medications  . lidocaine (XYLOCAINE) 2 % (with pres) injection 400 mg  . lactated ringers infusion 1,000 mL  . midazolam (VERSED) 5 MG/5ML injection 1-2 mg    Make sure Flumazenil is available in the pyxis when using this medication. If oversedation occurs, administer 0.2 mg IV over 15 sec. If after 45 sec no response, administer 0.2 mg again over 1 min; may repeat at 1 min intervals; not to exceed 4 doses (1 mg)  . fentaNYL (SUBLIMAZE) injection 25-50 mcg    Make sure Narcan is available in the pyxis when using this medication. In the event of respiratory depression (RR< 8/min): Titrate NARCAN (naloxone) in increments of 0.1 to 0.2 mg  IV at 2-3 minute intervals, until desired degree of reversal.  . methylPREDNISolone acetate (DEPO-MEDROL) injection 80 mg  . ropivacaine (PF) 2 mg/mL (0.2%) (NAROPIN) injection 9 mL  . ropivacaine (PF) 2 mg/mL (0.2%) (NAROPIN) injection 4 mL  . triamcinolone acetonide (KENALOG-40) injection 40 mg  . gabapentin  (NEURONTIN) 100 MG capsule    Sig: Take 1 capsule (100 mg total) by mouth at bedtime for 15 days, THEN 2 capsules (200 mg total) at bedtime for 15 days, THEN 3 capsules (300 mg total) at bedtime for 15 days. Follow written titration schedule.Marland Kitchen    Dispense:  90 capsule    Refill:  0    Fill one day early if pharmacy is closed on scheduled refill date. May substitute for generic if available.   Medications administered: We administered lidocaine, lactated ringers, midazolam, fentaNYL, methylPREDNISolone acetate, ropivacaine (PF) 2 mg/mL (0.2%), ropivacaine (PF) 2 mg/mL (0.2%), and triamcinolone acetonide.  See the medical record for exact dosing, route, and time of administration.  Follow-up plan:   Return in about 2 weeks (around 04/29/2020) for Procedure (w/ sedation): (R) CESI #1 + gabapentin evaluation.       Interventional Therapies  Risk  Complexity Considerations:   Estimated body mass index is 30.12 kg/m as calculated from the following:   Height as of this encounter: 5\' 5"  (1.651 m).   Weight as of this encounter: 181 lb (82.1 kg). NOTE: LATEX ALLERGY & Hx Hepatitis C (w/ cirrhosis)   Planned  Pending:   Therapeutic right IA shoulder joint and AC joint injection #2 + right trapezius muscle TPI #1  Evaluation of left hip area pain    Under consideration:   Diagnostic left LESI Possible bilateral lumbar facetRFA Diagnostic bilateral thoracic facet block Possiblebilateral thoracic facetRFA Possible bilateral cervical facetRFA Diagnostic bilateral GONB Diagnostic trigger point injections   Completed:   Diagnostic right shoulder injection x1  Palliative/diagnosticbilateral lumbar facet blockx3(100/100/90/90) Palliative/diagnostic bilateralcervicalfacet blockx1   Therapeutic  Palliative (PRN) options:   Diagnostic right shoulder injection #2  Palliative/diagnosticbilateral lumbar facet block#4 Palliative/diagnostic bilateralcervicalfacet block#2      Recent Visits Date Type Provider Dept  04/07/20 Office Visit Milinda Pointer, MD [facility]  Showing recent visits within past 90 days and meeting all other requirements Today's Visits Date Type Provider Dept  04/15/20 Procedure visit Milinda Pointer, MD [facility]  Showing today's visits and meeting all other requirements Future Appointments Date Type Provider Dept  05/04/20 Appointment Milinda Pointer, MD [facility]  07/05/20 Appointment Milinda Pointer, MD [facility]  Showing future appointments within next 90 days and meeting all other requirements  Disposition: Discharge home  Discharge (Date  Time): 04/15/2020; 1024 hrs.   Primary Care Physician: Lorrene Reid, PA-C Location: [facility] Note by: Gaspar Cola, MD Date: 04/15/2020; Time: 11:41 AM  Disclaimer:  Medicine is not an Chief Strategy Officer. The only guarantee in medicine is that nothing is guaranteed. It is important to note that the decision to proceed with this intervention was based on the information collected from the patient. The Data and conclusions were drawn from the patient's questionnaire, the interview, and the physical examination. Because the information was provided in large part by the patient, it cannot be guaranteed that it has not been purposely or unconsciously manipulated. Every effort has been made to obtain as much relevant data as possible for this evaluation. It is important to note that the conclusions that lead to this procedure are derived  in large part from the available data. Always take into account that the treatment will also be dependent on availability of resources and existing treatment guidelines, considered by other Pain Management Practitioners as being common knowledge and practice, at the time of the intervention. For Medico-Legal purposes, it is also important to point out that variation in procedural  techniques and pharmacological choices are the acceptable norm. The indications, contraindications, technique, and results of the above procedure should only be interpreted and judged by a Board-Certified Interventional Pain Specialist with extensive familiarity and expertise in the same exact procedure and technique.

## 2020-04-15 NOTE — Progress Notes (Signed)
Safety precautions to be maintained throughout the outpatient stay will include: orient to surroundings, keep bed in low position, maintain call bell within reach at all times, provide assistance with transfer out of bed and ambulation.  

## 2020-04-16 ENCOUNTER — Telehealth: Payer: Self-pay

## 2020-04-16 NOTE — Telephone Encounter (Signed)
Post procedure phone call. Patient states she is doing good.  

## 2020-04-22 ENCOUNTER — Other Ambulatory Visit: Payer: Self-pay | Admitting: Gastroenterology

## 2020-05-04 ENCOUNTER — Encounter: Payer: Self-pay | Admitting: Pain Medicine

## 2020-05-04 ENCOUNTER — Ambulatory Visit (HOSPITAL_BASED_OUTPATIENT_CLINIC_OR_DEPARTMENT_OTHER): Payer: Medicare Other | Admitting: Pain Medicine

## 2020-05-04 ENCOUNTER — Other Ambulatory Visit: Payer: Self-pay

## 2020-05-04 ENCOUNTER — Ambulatory Visit
Admission: RE | Admit: 2020-05-04 | Discharge: 2020-05-04 | Disposition: A | Discharge status: Home / Self Care | Payer: Medicare Other | Source: Ambulatory Visit | Attending: Pain Medicine | Admitting: Pain Medicine

## 2020-05-04 VITALS — BP 146/87 | HR 92 | Temp 96.8°F | Resp 16 | Ht 64.0 in | Wt 161.0 lb

## 2020-05-04 DIAGNOSIS — G4486 Cervicogenic headache: Secondary | ICD-10-CM | POA: Insufficient documentation

## 2020-05-04 DIAGNOSIS — M50122 Cervical disc disorder at C5-C6 level with radiculopathy: Secondary | ICD-10-CM | POA: Diagnosis not present

## 2020-05-04 DIAGNOSIS — M4802 Spinal stenosis, cervical region: Secondary | ICD-10-CM | POA: Diagnosis present

## 2020-05-04 DIAGNOSIS — M542 Cervicalgia: Secondary | ICD-10-CM

## 2020-05-04 DIAGNOSIS — M797 Fibromyalgia: Secondary | ICD-10-CM | POA: Insufficient documentation

## 2020-05-04 DIAGNOSIS — Z91040 Latex allergy status: Secondary | ICD-10-CM

## 2020-05-04 DIAGNOSIS — M5030 Other cervical disc degeneration, unspecified cervical region: Secondary | ICD-10-CM | POA: Insufficient documentation

## 2020-05-04 DIAGNOSIS — R209 Unspecified disturbances of skin sensation: Secondary | ICD-10-CM | POA: Insufficient documentation

## 2020-05-04 DIAGNOSIS — M792 Neuralgia and neuritis, unspecified: Secondary | ICD-10-CM | POA: Diagnosis present

## 2020-05-04 DIAGNOSIS — M5412 Radiculopathy, cervical region: Secondary | ICD-10-CM

## 2020-05-04 DIAGNOSIS — Z79899 Other long term (current) drug therapy: Secondary | ICD-10-CM | POA: Insufficient documentation

## 2020-05-04 DIAGNOSIS — G8929 Other chronic pain: Secondary | ICD-10-CM | POA: Insufficient documentation

## 2020-05-04 DIAGNOSIS — R208 Other disturbances of skin sensation: Secondary | ICD-10-CM | POA: Insufficient documentation

## 2020-05-04 MED ORDER — SODIUM CHLORIDE 0.9% FLUSH
1.0000 mL | Freq: Once | INTRAVENOUS | Status: AC
  Administered 2020-05-04: 1 mL

## 2020-05-04 MED ORDER — LIDOCAINE HCL 2 % IJ SOLN
20.0000 mL | Freq: Once | INTRAMUSCULAR | Status: AC
  Administered 2020-05-04: 400 mg

## 2020-05-04 MED ORDER — LACTATED RINGERS IV SOLN
1000.0000 mL | Freq: Once | INTRAVENOUS | Status: AC
  Administered 2020-05-04: 1000 mL via INTRAVENOUS

## 2020-05-04 MED ORDER — MIDAZOLAM HCL 5 MG/5ML IJ SOLN
INTRAMUSCULAR | Status: AC
  Filled 2020-05-04: qty 5

## 2020-05-04 MED ORDER — ROPIVACAINE HCL 2 MG/ML IJ SOLN
INTRAMUSCULAR | Status: AC
  Filled 2020-05-04: qty 10

## 2020-05-04 MED ORDER — SODIUM CHLORIDE (PF) 0.9 % IJ SOLN
INTRAMUSCULAR | Status: AC
  Filled 2020-05-04: qty 10

## 2020-05-04 MED ORDER — ROPIVACAINE HCL 2 MG/ML IJ SOLN
1.0000 mL | Freq: Once | INTRAMUSCULAR | Status: AC
  Administered 2020-05-04: 1 mL via EPIDURAL

## 2020-05-04 MED ORDER — LIDOCAINE HCL 2 % IJ SOLN
INTRAMUSCULAR | Status: AC
  Filled 2020-05-04: qty 20

## 2020-05-04 MED ORDER — DEXAMETHASONE SODIUM PHOSPHATE 10 MG/ML IJ SOLN
10.0000 mg | Freq: Once | INTRAMUSCULAR | Status: AC
  Administered 2020-05-04: 10 mg

## 2020-05-04 MED ORDER — GABAPENTIN 100 MG PO CAPS: 200.0000 mg | ORAL_CAPSULE | Freq: Every day | ORAL | 0 refills | Status: DC

## 2020-05-04 MED ORDER — IOHEXOL 180 MG/ML  SOLN
10.0000 mL | Freq: Once | INTRAMUSCULAR | Status: AC
  Administered 2020-05-04: 10 mL via EPIDURAL

## 2020-05-04 MED ORDER — MIDAZOLAM HCL 5 MG/5ML IJ SOLN
1.0000 mg | INTRAMUSCULAR | Status: DC | PRN
  Administered 2020-05-04: 1 mg via INTRAVENOUS

## 2020-05-04 MED ORDER — DEXAMETHASONE SODIUM PHOSPHATE 10 MG/ML IJ SOLN
INTRAMUSCULAR | Status: AC
  Filled 2020-05-04: qty 1

## 2020-05-04 MED ORDER — FENTANYL CITRATE (PF) 100 MCG/2ML IJ SOLN
INTRAMUSCULAR | Status: AC
  Filled 2020-05-04: qty 2

## 2020-05-04 MED ORDER — FENTANYL CITRATE (PF) 100 MCG/2ML IJ SOLN
25.0000 ug | INTRAMUSCULAR | Status: DC | PRN
Start: 2020-05-04 — End: 2020-05-04
  Administered 2020-05-04: 50 ug via INTRAVENOUS

## 2020-05-04 NOTE — Progress Notes (Signed)
PROVIDER NOTE: Information contained herein reflects review and annotations entered in association with encounter. Interpretation of such information and data should be left to medically-trained personnel. Information provided to patient can be located elsewhere in the medical record under "Patient Instructions". Document created using STT-dictation technology, any transcriptional errors that may result from process are unintentional.    Patient: Rachael West  Service Category: Procedure  Provider: Gaspar Cola, MD  DOB: 08-24-1955  DOS: 05/04/2020  Location: [facility]  MRN: [id number]  Setting: Ambulatory - outpatient  Referring Provider: Lorrene Reid, PA-C  Type: Established Patient  Specialty: Interventional Pain Management  PCP: Lorrene Reid, PA-C   Primary Reason for Visit: Interventional Pain Management Treatment. CC: Neck Pain  Procedure:          Anesthesia, Analgesia, Anxiolysis:  Type: Diagnostic, Inter-Laminar, Cervical Epidural Steroid Injection  #1  Region: Posterior Cervico-thoracic Region Level: C7-T1 Laterality: Right-Sided Paramedial  Type: Moderate (Conscious) Sedation combined with Local Anesthesia Indication(s): Analgesia and Anxiety Route: Intravenous (IV) IV Access: Secured Sedation: Meaningful verbal contact was maintained at all times during the procedure  Local Anesthetic: Lidocaine 1-2%  Position: Prone with head of the table was raised to facilitate breathing.   Indications: 1. Cervical disc disorder at C5-C6 level with radiculopathy (Right)   2. 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 Cervical radiculopathy at C6 (Right)   4. Cervicalgia   5. DDD (degenerative disc disease), cervical   6. Cervicogenic headache   7. Latex precautions, history of latex allergy   8. Pharmacologic therapy    Pain Score: Pre-procedure: 6 /10 Post-procedure: 0-No pain/10   Pre-op H&P Assessment:  Rachael West is  a 65 y.o. (year old), female patient, seen today for interventional treatment. She  has a past surgical history that includes Appendectomy; Trigger finger release; Esophagogastroduodenoscopy (01/17/2011); Colonoscopy (01/17/2011); Abdominal hysterectomy; and Breast biopsy (Left, 2015). Rachael West has a current medication list which includes the following prescription(s): albuterol, atenolol, b complex vitamins, buspirone, dimenhydrinate, fluticasone-salmeterol, hydroxyzine, hydroxyzine, ipratropium-albuterol, latanoprost, lisinopril, loratadine, loratadine, lubiprostone, magnesium, meloxicam, mirabegron er, montelukast, omeprazole, ondansetron, paliperidone, polyethylene glycol, prazosin, sertraline, tramadol, trazodone, benefiber, and gabapentin, and the following Facility-Administered Medications: fentanyl and midazolam. Her primarily concern today is the Neck Pain  Initial Vital Signs:  Pulse/HCG Rate: 94ECG Heart Rate: 83 Temp: (!) 97.2 F (36.2 C) Resp: 16 BP: 124/89 SpO2: 99 %  BMI: Estimated body mass index is 27.64 kg/m as calculated from the following:   Height as of this encounter: 5\' 4"  (1.626 m).   Weight as of this encounter: 161 lb (73 kg).  Risk Assessment: Allergies: Reviewed. She is allergic to latex, penicillins, codeine, cortisone, and sulfa antibiotics.  Allergy Precautions: None required Coagulopathies: Reviewed. None identified.  Blood-thinner therapy: None at this time Active Infection(s): Reviewed. None identified. Rachael West is afebrile  Site Confirmation: Rachael West was asked to confirm the procedure and laterality before marking the site Procedure checklist: Completed Consent: Before the procedure and under the influence of no sedative(s), amnesic(s), or anxiolytics, the patient was informed of the treatment options, risks and possible complications. To fulfill our ethical and legal obligations, as recommended by the American Medical Association's  Code of Ethics, I have informed the patient of my clinical impression; the nature and purpose of the treatment or procedure; the risks, benefits, and possible complications of the intervention; the alternatives, including doing nothing; the risk(s) and benefit(s) of the alternative treatment(s) or procedure(s); and the risk(s) and  benefit(s) of doing nothing. The patient was provided information about the general risks and possible complications associated with the procedure. These may include, but are not limited to: failure to achieve desired goals, infection, bleeding, organ or nerve damage, allergic reactions, paralysis, and death. In addition, the patient was informed of those risks and complications associated to Spine-related procedures, such as failure to decrease pain; infection (i.e.: Meningitis, epidural or intraspinal abscess); bleeding (i.e.: epidural hematoma, subarachnoid hemorrhage, or any other type of intraspinal or peri-dural bleeding); organ or nerve damage (i.e.: Any type of peripheral nerve, nerve root, or spinal cord injury) with subsequent damage to sensory, motor, and/or autonomic systems, resulting in permanent pain, numbness, and/or weakness of one or several areas of the body; allergic reactions; (i.e.: anaphylactic reaction); and/or death. Furthermore, the patient was informed of those risks and complications associated with the medications. These include, but are not limited to: allergic reactions (i.e.: anaphylactic or anaphylactoid reaction(s)); adrenal axis suppression; blood sugar elevation that in diabetics may result in ketoacidosis or comma; water retention that in patients with history of congestive heart failure may result in shortness of breath, pulmonary edema, and decompensation with resultant heart failure; weight gain; swelling or edema; medication-induced neural toxicity; particulate matter embolism and blood vessel occlusion with resultant organ, and/or nervous system  infarction; and/or aseptic necrosis of one or more joints. Finally, the patient was informed that Medicine is not an exact science; therefore, there is also the possibility of unforeseen or unpredictable risks and/or possible complications that may result in a catastrophic outcome. The patient indicated having understood very clearly. We have given the patient no guarantees and we have made no promises. Enough time was given to the patient to ask questions, all of which were answered to the patient's satisfaction. Ms. Danker has indicated that she wanted to continue with the procedure. Attestation: I, the ordering provider, attest that I have discussed with the patient the benefits, risks, side-effects, alternatives, likelihood of achieving goals, and potential problems during recovery for the procedure that I have provided informed consent. Date  Time: 05/04/2020  8:15 AM  Pre-Procedure Preparation:  Monitoring: As per clinic protocol. Respiration, ETCO2, SpO2, BP, heart rate and rhythm monitor placed and checked for adequate function Safety Precautions: Patient was assessed for positional comfort and pressure points before starting the procedure. Time-out: I initiated and conducted the "Time-out" before starting the procedure, as per protocol. The patient was asked to participate by confirming the accuracy of the "Time Out" information. Verification of the correct person, site, and procedure were performed and confirmed by me, the nursing staff, and the patient. "Time-out" conducted as per Joint Commission's Universal Protocol (UP.01.01.01). Time: 0932  Description of Procedure:          Target Area: For Epidural Steroid injections the target is the interlaminar space, initially targeting the lower border of the superior vertebral body lamina. Approach: Paramedial approach. Area Prepped: Entire PosteriorCervical Region DuraPrep (Iodine Povacrylex [0.7% available iodine] and Isopropyl Alcohol,  74% w/w) Safety Precautions: Aspiration looking for blood return was conducted prior to all injections. At no point did we inject any substances, as a needle was being advanced. No attempts were made at seeking any paresthesias. Safe injection practices and needle disposal techniques used. Medications properly checked for expiration dates. SDV (single dose vial) medications used. Description of the Procedure: Protocol guidelines were followed. The procedure needle was introduced through the skin, ipsilateral to the reported pain, and advanced to the target area. Bone was contacted  and the needle walked caudad, until the lamina was cleared. The epidural space was identified using "loss-of-resistance technique" with 2-3 ml of PF-NaCl (0.9% NSS), in a 5cc LOR glass syringe. Vitals:   05/04/20 0900 05/04/20 0910 05/04/20 0920 05/04/20 0930  BP: 135/88 137/80 135/88 (!) 146/87  Pulse:  78 94 92  Resp: 16 16 16 16   Temp:  (!) 96.8 F (36 C)    TempSrc:  Tympanic    SpO2: 94% 95% 100% 99%  Weight:      Height:        Start Time: 0854 hrs. End Time: 0900 hrs. Materials:  Needle(s) Type: Epidural needle Gauge: 17G Length: 3.5-in Medication(s): Please see orders for medications and dosing details.  Imaging Guidance (Spinal):          Type of Imaging Technique: Fluoroscopy Guidance (Spinal) Indication(s): Assistance in needle guidance and placement for procedures requiring needle placement in or near specific anatomical locations not easily accessible without such assistance. Exposure Time: Please see nurses notes. Contrast: Before injecting any contrast, we confirmed that the patient did not have an allergy to iodine, shellfish, or radiological contrast. Once satisfactory needle placement was completed at the desired level, radiological contrast was injected. Contrast injected under live fluoroscopy. No contrast complications. See chart for type and volume of contrast used. Fluoroscopic Guidance: I  was personally present during the use of fluoroscopy. "Tunnel Vision Technique" used to obtain the best possible view of the target area. Parallax error corrected before commencing the procedure. "Direction-depth-direction" technique used to introduce the needle under continuous pulsed fluoroscopy. Once target was reached, antero-posterior, oblique, and lateral fluoroscopic projection used confirm needle placement in all planes. Images permanently stored in EMR. Interpretation: I personally interpreted the imaging intraoperatively. Adequate needle placement confirmed in multiple planes. Appropriate spread of contrast into desired area was observed. No evidence of afferent or efferent intravascular uptake. No intrathecal or subarachnoid spread observed. Permanent images saved into the patient's record.  Antibiotic Prophylaxis:   Anti-infectives (From admission, onward)   None     Indication(s): None identified  Post-operative Assessment:  Post-procedure Vital Signs:  Pulse/HCG Rate: 92 (nsr)80 Temp: (!) 96.8 F (36 C) Resp: 16 BP: (!) 146/87 SpO2: 99 %  EBL: None  Complications: No immediate post-treatment complications observed by team, or reported by patient.  Note: The patient tolerated the entire procedure well. A repeat set of vitals were taken after the procedure and the patient was kept under observation following institutional policy, for this type of procedure. Post-procedural neurological assessment was performed, showing return to baseline, prior to discharge. The patient was provided with post-procedure discharge instructions, including a section on how to identify potential problems. Should any problems arise concerning this procedure, the patient was given instructions to immediately contact us, at any time, without hesitation. In any case, we plan to contact the patient by telephone for a follow-up status report regarding this interventional procedure.  Comments:  No  additional relevant information.  Plan of Care  Orders:  Orders Placed This Encounter  Procedures  . Cervical Epidural Injection    Procedure: Cervical Epidural Steroid Injection/Block Purpose: Diagnostic Indication(s): Radiculitis and cervicalgia associater with cervical degenerative disc disease.    Scheduling Instructions:     Level(s): C7-T1     Laterality: Right-sided     Sedation: Patient's choice.     Timeframe: Today    Order Specific Question:   Where will this procedure be performed?    Answer:   [facility]    Comments:   by Dr. Dossie Arbour  . [facility] C-ARM 1-60 MIN NO REPORT    Intraoperative interpretation by procedural physician at [facility].    Standing Status:   Standing    Number of Occurrences:   1    Order Specific Question:   Reason for exam:    Answer:   Assistance in needle guidance and placement for procedures requiring needle placement in or near specific anatomical locations not easily accessible without such assistance.  . Informed Consent Details: Physician/Practitioner Attestation; Transcribe to consent form and obtain patient signature    Nursing Order: Transcribe to consent form and obtain patient signature. Note: Always confirm laterality of pain with Ms. Murfin, before procedure.    Order Specific Question:   Physician/Practitioner attestation of informed consent for procedure/surgical case    Answer:   I, the physician/practitioner, attest that I have discussed with the patient the benefits, risks, side effects, alternatives, likelihood of achieving goals and potential problems during recovery for the procedure that I have provided informed consent.    Order Specific Question:   Procedure    Answer:   Cervical Epidural Steroid Injection (CESI) under fluoroscopic guidance    Order Specific Question:   Physician/Practitioner performing the procedure    Answer:   Jo Cerone A. Dossie Arbour MD    Order Specific Question:    Indication/Reason    Answer:   Cervicalgia (Neck Pain) with or without Cervical Radiculopathy/Radiculitis (Arm/Shoulder Pain, Numbness, and/or weakness), secondary to Cervical and/or Cervicothoracic Degenerative Disc Disease (DDD), with or without Intervertebral Disc Displacement (IVD  . Provide equipment / supplies at bedside    "Epidural Tray" (Disposable  single use) Catheter: NOT required    Standing Status:   Standing    Number of Occurrences:   1    Order Specific Question:   Specify    Answer:   Epidural Tray  . Latex precautions    Activate Latex-Free Protocol.    Standing Status:   Standing    Number of Occurrences:   1   Chronic Opioid Analgesic:  Tramadol50 mg,2tabs (100 mg) PO q 6 hrs (400 mg/day of tramadol) MME/day:40mg /day.   Medications ordered for procedure: Meds ordered this encounter  Medications  . iohexol (OMNIPAQUE) 180 MG/ML injection 10 mL    Must be Myelogram-compatible. If not available, you may substitute with a water-soluble, non-ionic, hypoallergenic, myelogram-compatible radiological contrast medium.  Marland Kitchen lidocaine (XYLOCAINE) 2 % (with pres) injection 400 mg  . lactated ringers infusion 1,000 mL  . midazolam (VERSED) 5 MG/5ML injection 1-2 mg    Make sure Flumazenil is available in the pyxis when using this medication. If oversedation occurs, administer 0.2 mg IV over 15 sec. If after 45 sec no response, administer 0.2 mg again over 1 min; may repeat at 1 min intervals; not to exceed 4 doses (1 mg)  . fentaNYL (SUBLIMAZE) injection 25-50 mcg    Make sure Narcan is available in the pyxis when using this medication. In the event of respiratory depression (RR< 8/min): Titrate NARCAN (naloxone) in increments of 0.1 to 0.2 mg IV at 2-3 minute intervals, until desired degree of reversal.  . sodium chloride flush (NS) 0.9 % injection 1 mL  . ropivacaine (PF) 2 mg/mL (0.2%) (NAROPIN) injection 1 mL  . dexamethasone (DECADRON) injection 10 mg  . gabapentin  (NEURONTIN) 100 MG capsule    Sig: Take 2 capsules (200 mg total) by mouth at bedtime. Follow written titration schedule.  Dispense:  180 capsule    Refill:  0    Fill one day early if pharmacy is closed on scheduled refill date. May substitute for generic if available.   Medications administered: We administered iohexol, lidocaine, lactated ringers, midazolam, fentaNYL, sodium chloride flush, ropivacaine (PF) 2 mg/mL (0.2%), and dexamethasone.  See the medical record for exact dosing, route, and time of administration.  Follow-up plan:   Return in about 2 weeks (around 05/18/2020) for (F2F), (PPE).       Interventional Therapies  Risk  Complexity Considerations:   Estimated body mass index is 30.12 kg/m as calculated from the following:   Height as of this encounter: 5\' 5"  (1.651 m).   Weight as of this encounter: 181 lb (82.1 kg). NOTE: LATEX ALLERGY & Hx Hepatitis C (w/ cirrhosis)   Planned  Pending:   Therapeutic right IA shoulder joint and AC joint injection #2 + right trapezius muscle TPI #1  Evaluation of left hip area pain    Under consideration:   Diagnostic left LESI Possible bilateral lumbar facetRFA Diagnostic bilateral thoracic facet block Possiblebilateral thoracic facetRFA Possible bilateral cervical facetRFA Diagnostic bilateral GONB Diagnostic trigger point injections   Completed:   Diagnostic right shoulder injection x1  Palliative/diagnosticbilateral lumbar facet blockx3(100/100/90/90) Palliative/diagnostic bilateralcervicalfacet blockx1   Therapeutic  Palliative (PRN) options:   Diagnostic right shoulder injection #2  Palliative/diagnosticbilateral lumbar facet block#4 Palliative/diagnostic bilateralcervicalfacet block#2      Recent Visits Date Type Provider Dept  04/15/20 Procedure visit Milinda Pointer, MD [facility]  04/07/20 Office Visit Milinda Pointer, MD [facility]  Showing recent visits  within past 90 days and meeting all other requirements Today's Visits Date Type Provider Dept  05/04/20 Procedure visit Milinda Pointer, MD [facility]  Showing today's visits and meeting all other requirements Future Appointments Date Type Provider Dept  05/11/20 Appointment Milinda Pointer, MD [facility]  07/05/20 Appointment Milinda Pointer, MD [facility]  Showing future appointments within next 90 days and meeting all other requirements  Disposition: Discharge home  Discharge (Date  Time): 05/04/2020; 0933 hrs.   Primary Care Physician: Lorrene Reid, PA-C Location: [facility] Note by: Gaspar Cola, MD Date: 05/04/2020; Time: 11:40 AM  Disclaimer:  Medicine is not an Chief Strategy Officer. The only guarantee in medicine is that nothing is guaranteed. It is important to note that the decision to proceed with this intervention was based on the information collected from the patient. The Data and conclusions were drawn from the patient's questionnaire, the interview, and the physical examination. Because the information was provided in large part by the patient, it cannot be guaranteed that it has not been purposely or unconsciously manipulated. Every effort has been made to obtain as much relevant data as possible for this evaluation. It is important to note that the conclusions that lead to this procedure are derived in large part from the available data. Always take into account that the treatment will also be dependent on availability of resources and existing treatment guidelines, considered by other Pain Management Practitioners as being common knowledge and practice, at the time of the intervention. For Medico-Legal purposes, it is also important to point out that variation in procedural techniques and pharmacological choices are the acceptable norm. The indications, contraindications, technique, and results of the  above procedure should only be interpreted and judged by a Board-Certified Interventional Pain Specialist with extensive familiarity and expertise in the same exact procedure and technique.

## 2020-05-04 NOTE — Progress Notes (Signed)
Safety precautions to be maintained throughout the outpatient stay will include: orient to surroundings, keep bed in low position, maintain call bell within reach at all times, provide assistance with transfer out of bed and ambulation.  

## 2020-05-04 NOTE — Patient Instructions (Signed)

## 2020-05-05 ENCOUNTER — Telehealth: Payer: Self-pay | Admitting: *Deleted

## 2020-05-05 NOTE — Telephone Encounter (Signed)
Spoke with patient re; procedure on yesterday.  No questions or concerns.  

## 2020-05-11 ENCOUNTER — Encounter: Payer: Self-pay | Admitting: Pain Medicine

## 2020-05-11 ENCOUNTER — Ambulatory Visit: Payer: Medicare Other | Attending: Pain Medicine | Admitting: Pain Medicine

## 2020-05-11 ENCOUNTER — Other Ambulatory Visit: Payer: Self-pay

## 2020-05-11 VITALS — BP 147/87 | HR 88 | Temp 97.3°F | Resp 16 | Ht 64.5 in | Wt 169.0 lb

## 2020-05-11 DIAGNOSIS — M542 Cervicalgia: Secondary | ICD-10-CM

## 2020-05-11 DIAGNOSIS — M25551 Pain in right hip: Secondary | ICD-10-CM | POA: Insufficient documentation

## 2020-05-11 DIAGNOSIS — M5412 Radiculopathy, cervical region: Secondary | ICD-10-CM

## 2020-05-11 DIAGNOSIS — M50122 Cervical disc disorder at C5-C6 level with radiculopathy: Secondary | ICD-10-CM | POA: Diagnosis not present

## 2020-05-11 DIAGNOSIS — G8929 Other chronic pain: Secondary | ICD-10-CM | POA: Diagnosis present

## 2020-05-11 DIAGNOSIS — M25552 Pain in left hip: Secondary | ICD-10-CM | POA: Insufficient documentation

## 2020-05-11 NOTE — Progress Notes (Signed)
PROVIDER NOTE: Information contained herein reflects review and annotations entered in association with encounter. Interpretation of such information and data should be left to medically-trained personnel. Information provided to patient can be located elsewhere in the medical record under "Patient Instructions". Document created using STT-dictation technology, any transcriptional errors that may result from process are unintentional.    Patient: Rachael West  Service Category: E/M  Provider: Gaspar Cola, MD  DOB: Jan 03, 1956  DOS: 05/11/2020  Specialty: Interventional Pain Management  MRN: [id number]  Setting: Ambulatory outpatient  PCP: Lorrene Reid, PA-C  Type: Established Patient    Referring Provider: Lorrene Reid, PA-C  Location: Office  Delivery: Face-to-face     HPI  [name], a 65 y.o. year old female, is here today because of her Cervicalgia [M54.2]. [name] primary complain today is Neck Pain Last encounter: My last encounter with her was on 05/04/2020. Pertinent problems: [name] has Large B-cell lymphoma ([place]); Atypical chest pain; Chronic low back pain (Primary Area of Pain) (Bilateral) w/ sciatica (Left); Chronic lower extremity pain (2ry area of Pain) (Left); Chronic upper back pain (3ry area of Pain) (Bilateral) (L>R); Chronic neck pain (4th area of Pain) (Bilateral) (L>R); Occipital headache; Chronic knee pain (Bilateral) (R>L); Chronic pain syndrome; Chronic sacroiliac joint pain; DDD (degenerative disc disease), lumbar; Lumbar facet arthropathy; Lumbar facet syndrome (Bilateral) (L>R); Spondylosis without myelopathy or radiculopathy, lumbar region; DDD (degenerative disc disease), thoracic; Thoracolumbar dextroscoliosis; Cervicogenic headache; Tendinitis of rotator cuff (Left); Tendinitis of rotator cuff (Right); Fibromyalgia; Chronic shoulder pain (Bilateral) (R>L); Chronic hip pain (Bilateral) (L>R); Cervicalgia; Chronic  idiopathic gout; DDD (degenerative disc disease), cervical; 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); Cervical facet syndrome (Bilateral) (R>L); Spondylosis without myelopathy or radiculopathy, cervical region; Chronic musculoskeletal pain; Chronic low back pain (1ry area of Pain) (Bilateral) (L>R) w/o sciatica; Cervical disc disorder at C5-C6 level with radiculopathy (Right); Cervical radiculopathy at C6 (Right); Osteoarthrosis of multiple sites; Radicular pain of upper extremity (Right); Chronic upper extremity pain (Right); Chronic shoulder pain (Right); Arthralgia of shoulder region (Right); Shoulder enthesopathy (Right); Musculoskeletal disorder involving upper trapezius muscle (Right); Abnormal MRI, cervical spine (11/12/2019); Chronic neuropathic pain; Disturbance of skin sensation; and Hyperalgesia on their pertinent problem list. Pain Assessment: Severity of Chronic pain is reported as a 0-No pain/10. Location: Neck  /shoulders bilateral, patient is able to raise arms, and move head. Onset: More than a month ago. Quality: Aching,Discomfort. Timing: Intermittent. Modifying factor(s): procedure, rest, medications. Vitals:  height is 5' 4.5" (1.638 m) and weight is 169 lb (76.7 kg). Her temperature is 97.3 F (36.3 C) (abnormal). Her blood pressure is 147/87 (abnormal) and her pulse is 88. Her respiration is 16 and oxygen saturation is 100%.   Reason for encounter: post-procedure assessment.  The patient indicates having done great with that right sided cervical epidural steroid injection.  She has an ongoing 95% improvement of her neck pain.  She can move her head to any direction without any problems, which she was unable to do before.  Today she indicates the only discomfort that she has is in the area of the left hip.  Physical exam today demonstrated the patient to have a positive Patrick maneuver on the left side for hip arthralgia.  The patient was offered an injection and she has  decided to take the option of doing the injection PRN.  Post-Procedure Evaluation  Procedure (05/04/2020): Diagnostic right sided cervical ESI #1 under fluoroscopic guidance and IV sedation Pre-procedure pain level: 6/10  Post-procedure: 0/10 (100% relief)  Sedation: Sedation provided.  Effectiveness during initial hour after procedure(Ultra-Short Term Relief): 100 %.  Local anesthetic used: Long-acting (4-6 hours) Effectiveness: Defined as any analgesic benefit obtained secondary to the administration of local anesthetics. This carries significant diagnostic value as to the etiological location, or anatomical origin, of the pain. Duration of benefit is expected to coincide with the duration of the local anesthetic used.  Effectiveness during initial 4-6 hours after procedure(Short-Term Relief): 100 %.  Long-term benefit: Defined as any relief past the pharmacologic duration of the local anesthetics.  Effectiveness past the initial 6 hours after procedure(Long-Term Relief): 95 %.  Current benefits: Defined as benefit that persist at this time.   Analgesia:  Ongoing 95% relief of the neck pain, shoulder pain, upper extremity symptoms, and decreased range of motion. Function: [name] reports improvement in function ROM: [name] reports improvement in ROM  Pharmacotherapy Assessment   Analgesic: Tramadol50 mg,2tabs (100 mg) PO q 6 hrs (400 mg/day of tramadol) MME/day:3m/day.   Monitoring: [place] PMP: PDMP reviewed during this encounter.       Pharmacotherapy: No side-effects or adverse reactions reported. Compliance: No problems identified. Effectiveness: Clinically acceptable.  GIgnatius Specking RN  05/11/2020  2:18 PM  Sign when Signing Visit Safety precautions to be maintained throughout the outpatient stay will include: orient to surroundings, keep bed in low position, maintain call bell within reach at all times, provide assistance with transfer out of bed and  ambulation.     UDS:  Summary  Date Value Ref Range Status  05/22/2019 Note  Final    Comment:    ==================================================================== [employer] [facility] (MW) ==================================================================== Test                             Result       Flag       Units Drug Present and Declared for Prescription Verification   Tramadol                       >16129       EXPECTED   ng/mg creat   O-Desmethyltramadol            11787        EXPECTED   ng/mg creat   N-Desmethyltramadol            7242         EXPECTED   ng/mg creat    Source of tramadol is a prescription medication. O-desmethyltramadol    and N-desmethyltramadol are expected metabolites of tramadol. ==================================================================== Test                      Result    Flag   Units      Ref Range   Creatinine              31               mg/dL      >=20 ==================================================================== Declared Medications:  The flagging and interpretation on this report are based on the  following declared medications.  Unexpected results may arise from  inaccuracies in the declared medications.  **Note: The testing scope of this panel includes these medications:  Tramadol (Ultram)  **Note: The testing scope of this panel does not include the  following reported medications:  Albuterol  Albuterol (Duoneb)  Atenolol  Buspirone  Calcium  Dimenhydrinate  Eye Drop  Fluticasone (Advair)  Hydroxyzine (Vistaril)  Ipratropium (Duoneb)  Lisinopril (Zestril)  Lubiprostone (Amitiza)  Magnesium  Mirabegron (Myrbetriq)  Montelukast (Singulair)  Omeprazole (Prilosec)  Ondansetron (Zofran)  Paliperidone (Invega)  Polyethylene Glycol (MiraLAX)  Prazosin (Minipress)  Salmeterol (Advair)  Sertraline (Zoloft)  Supplement  Trazodone (Desyrel)  Vitamin  D ==================================================================== For clinical consultation, please call [phone]. ====================================================================      ROS  Constitutional: Denies any fever or chills Gastrointestinal: No reported hemesis, hematochezia, vomiting, or acute GI distress Musculoskeletal: Denies any acute onset joint swelling, redness, loss of ROM, or weakness Neurological: No reported episodes of acute onset apraxia, aphasia, dysarthria, agnosia, amnesia, paralysis, loss of coordination, or loss of consciousness  Medication Review  Benefiber, Fluticasone-Salmeterol, Magnesium, albuterol, atenolol, b complex vitamins, busPIRone, dimenhyDRINATE, gabapentin, hydrOXYzine, ipratropium-albuterol, latanoprost, lisinopril, loratadine, lubiprostone, meloxicam, mirabegron ER, montelukast, omeprazole, ondansetron, paliperidone, polyethylene glycol, prazosin, sertraline, traMADol, and traZODone  History Review  Allergy: Ms. Arauz is allergic to latex, penicillins, codeine, cortisone, and sulfa antibiotics. Drug: Ms. Radi  reports no history of drug use. Alcohol:  reports no history of alcohol use. Tobacco:  reports that she quit smoking about 30 years ago. Her smoking use included cigarettes. She has a 30.00 pack-year smoking history. She has never used smokeless tobacco. Social: Ms. Nolley  reports that she quit smoking about 30 years ago. Her smoking use included cigarettes. She has a 30.00 pack-year smoking history. She has never used smokeless tobacco. She reports that she does not drink alcohol and does not use drugs. Medical:  has a past medical history of Anemia, Anxiety, Arthritis, Asthma, Depression, GERD (gastroesophageal reflux disease), Hepatitis-C, History of chemotherapy, Hypertension, Insomnia, Lymphoma ([place]) (12/2013), Personal history of non-Hodgkin lymphomas, and Vitamin D deficiency. Surgical: Ms.  Kallstrom  has a past surgical history that includes Appendectomy; Trigger finger release; Esophagogastroduodenoscopy (01/17/2011); Colonoscopy (01/17/2011); Abdominal hysterectomy; and Breast biopsy (Left, 2015). Family: family history includes Allergies in her mother; Asthma in her mother and sister; Breast cancer in her cousin; Colon cancer in her paternal aunt; Diabetes in her maternal grandfather and maternal grandmother; Heart disease in her brother, father, paternal grandfather, and paternal grandmother; Liver cancer in her paternal uncle; Ovarian cancer in an other family member; Pancreatic cancer in an other family member.  Laboratory Chemistry Profile   Renal Lab Results  Component Value Date   BUN 7 (L) 03/25/2020   CREATININE 0.77 03/25/2020   BCR 9 (L) 03/25/2020   GFRAA >60 08/05/2019   GFRNONAA >60 08/05/2019     Hepatic Lab Results  Component Value Date   AST 14 03/25/2020   ALT 11 03/25/2020   ALBUMIN 4.1 03/25/2020   ALKPHOS 96 03/25/2020   HCVAB REACTIVE (A) 01/12/2014   LIPASE 34 10/31/2010     Electrolytes Lab Results  Component Value Date   NA 143 03/25/2020   K 5.2 03/25/2020   CL 104 03/25/2020   CALCIUM 9.5 03/25/2020   MG 2.2 08/29/2017     Bone Lab Results  Component Value Date   25OHVITD1 21 (L) 08/29/2017   25OHVITD2 <1.0 08/29/2017   25OHVITD3 21 08/29/2017     Inflammation (CRP: Acute Phase) (ESR: Chronic Phase) Lab Results  Component Value Date   CRP 12 (H) 08/29/2017   ESRSEDRATE 46 (H) 08/29/2017       Note: Above Lab results reviewed.  Recent Imaging Review  DG PAIN CLINIC C-ARM 1-60 MIN NO REPORT Fluoro was used, but no Radiologist interpretation will be provided.  Please refer to "NOTES" tab for provider progress note. Note: Reviewed        Physical Exam  General appearance: Well nourished, well developed, and well hydrated. In no apparent acute distress Mental status: Alert, oriented x 3 (person, place, & time)        Respiratory: No evidence of acute respiratory distress Eyes: PERLA Vitals: BP (!) 147/87   Pulse 88   Temp (!) 97.3 F (36.3 C)   Resp 16   Ht 5' 4.5" (1.638 m)   Wt 169 lb (76.7 kg)   SpO2 100%   BMI 28.56 kg/m  BMI: Estimated body mass index is 28.56 kg/m as calculated from the following:   Height as of this encounter: 5' 4.5" (1.638 m).   Weight as of this encounter: 169 lb (76.7 kg). Ideal: Ideal body weight: 55.8 kg (123 lb 2 oz) Adjusted ideal body weight: 64.2 kg (141 lb 7.6 oz)  Assessment   Status Diagnosis  Controlled Controlled Controlled 1. Cervicalgia   2. Cervical disc disorder at C5-C6 level with radiculopathy (Right)   3. Cervical radiculopathy at C6 (Right)   4. Chronic hip pain (Bilateral) (L>R)      Updated Problems: No problems updated.  Plan of Care  Problem-specific:  No problem-specific Assessment & Plan notes found for this encounter.  Ms. NASIYAH LAVERDIERE has a current medication list which includes the following long-term medication(s): albuterol, atenolol, fluticasone-salmeterol, gabapentin, ipratropium-albuterol, lisinopril, loratadine, loratadine, lubiprostone, magnesium, meloxicam, mirabegron er, montelukast, omeprazole, paliperidone, prazosin, sertraline, tramadol, trazodone, and benefiber.  Pharmacotherapy (Medications Ordered): No orders of the defined types were placed in this encounter.  Orders:  Orders Placed This Encounter  Procedures  . HIP INJECTION    Standing Status:   Standing    Number of Occurrences:   1    Standing Expiration Date:   08/11/2020    Scheduling Instructions:     Side: Left-sided     Sedation: Patient's choice.     Timeframe: PRN   Follow-up plan:   Return if symptoms worsen or fail to improve, for PRN Procedure (no sedation): (L) IA Hip inj..      Interventional Therapies  Risk  Complexity Considerations:   Estimated body mass index is 30.12 kg/m as calculated from the following:   Height as  of this encounter: 5' 5"  (1.651 m).   Weight as of this encounter: 181 lb (82.1 kg). NOTE: LATEX ALLERGY & Hx Hepatitis C (w/ cirrhosis)   Planned  Pending:   Therapeutic right IA shoulder joint and AC joint injection #2 + right trapezius muscle TPI #1  Evaluation of left hip area pain    Under consideration:   Diagnostic left LESI Possible bilateral lumbar facetRFA Diagnostic bilateral thoracic facet block Possiblebilateral thoracic facetRFA Possible bilateral cervical facetRFA Diagnostic bilateral GONB Diagnostic trigger point injections   Completed:   Diagnostic right shoulder injection x1  Palliative/diagnosticbilateral lumbar facet blockx3(100/100/90/90) Palliative/diagnostic bilateralcervicalfacet blockx1   Therapeutic  Palliative (PRN) options:   Diagnostic right shoulder injection #2  Palliative/diagnosticbilateral lumbar facet block#4 Palliative/diagnostic bilateralcervicalfacet block#2       Recent Visits Date Type Provider Dept  05/04/20 Procedure visit Milinda Pointer, MD [facility]  04/15/20 Procedure visit Milinda Pointer, MD [facility]  04/07/20 Office Visit Milinda Pointer, MD [facility]  Showing recent visits within past 90 days and meeting all other requirements Today's Visits Date Type Provider Dept  05/11/20 Office Visit Milinda Pointer, MD [facility]  Showing  today's visits and meeting all other requirements Future Appointments Date Type Provider Dept  07/05/20 Appointment Milinda Pointer, MD [facility]  Showing future appointments within next 90 days and meeting all other requirements  I discussed the assessment and treatment plan with the patient. The patient was provided an opportunity to ask questions and all were answered. The patient agreed with the plan and demonstrated an understanding of the instructions.  Patient advised to call back or seek an  in-person evaluation if the symptoms or condition worsens.  Duration of encounter: 30 minutes.  Note by: Gaspar Cola, MD Date: 05/11/2020; Time: 2:38 PM

## 2020-05-11 NOTE — Patient Instructions (Signed)
____________________________________________________________________________________________  Preparing for your procedure (without sedation)  Procedure appointments are limited to planned procedures: . No Prescription Refills. . No disability issues will be discussed. . No medication changes will be discussed.  Instructions: . Oral Intake: Do not eat or drink anything for at least 6 hours prior to your procedure. (Exception: Blood Pressure Medication. See below.) . Transportation: Unless otherwise stated by your physician, you may drive yourself after the procedure. . Blood Pressure Medicine: Do not forget to take your blood pressure medicine with a sip of water the morning of the procedure. If your Diastolic (lower reading)is above 100 mmHg, elective cases will be cancelled/rescheduled. . Blood thinners: These will need to be stopped for procedures. Notify our staff if you are taking any blood thinners. Depending on which one you take, there will be specific instructions on how and when to stop it. . Diabetics on insulin: Notify the staff so that you can be scheduled 1st case in the morning. If your diabetes requires high dose insulin, take only  of your normal insulin dose the morning of the procedure and notify the staff that you have done so. . Preventing infections: Shower with an antibacterial soap the morning of your procedure.  . Build-up your immune system: Take 1000 mg of Vitamin C with every meal (3 times a day) the day prior to your procedure. . Antibiotics: Inform the staff if you have a condition or reason that requires you to take antibiotics before dental procedures. . Pregnancy: If you are pregnant, call and cancel the procedure. . Sickness: If you have a cold, fever, or any active infections, call and cancel the procedure. . Arrival: You must be in the facility at least 30 minutes prior to your scheduled procedure. . Children: Do not bring any children with you. . Dress  appropriately: Bring dark clothing that you would not mind if they get stained. . Valuables: Do not bring any jewelry or valuables.  Reasons to call and reschedule or cancel your procedure: (Following these recommendations will minimize the risk of a serious complication.) . Surgeries: Avoid having procedures within 2 weeks of any surgery. (Avoid for 2 weeks before or after any surgery). . Flu Shots: Avoid having procedures within 2 weeks of a flu shots or . (Avoid for 2 weeks before or after immunizations). . Barium: Avoid having a procedure within 7-10 days after having had a radiological study involving the use of radiological contrast. (Myelograms, Barium swallow or enema study). . Heart attacks: Avoid any elective procedures or surgeries for the initial 6 months after a "Myocardial Infarction" (Heart Attack). . Blood thinners: It is imperative that you stop these medications before procedures. Let us know if you if you take any blood thinner.  . Infection: Avoid procedures during or within two weeks of an infection (including chest colds or gastrointestinal problems). Symptoms associated with infections include: Localized redness, fever, chills, night sweats or profuse sweating, burning sensation when voiding, cough, congestion, stuffiness, runny nose, sore throat, diarrhea, nausea, vomiting, cold or Flu symptoms, recent or current infections. It is specially important if the infection is over the area that we intend to treat. . Heart and lung problems: Symptoms that may suggest an active cardiopulmonary problem include: cough, chest pain, breathing difficulties or shortness of breath, dizziness, ankle swelling, uncontrolled high or unusually low blood pressure, and/or palpitations. If you are experiencing any of these symptoms, cancel your procedure and contact your primary care physician for an evaluation.  Remember:  Regular   Business hours are:  Monday to Thursday 8:00 AM to 4:00  PM  Provider's Schedule: Milinda Pointer, MD:  Procedure days: Tuesday and Thursday 7:30 AM to 4:00 PM  Gillis Santa, MD:  Procedure days: Monday and Wednesday 7:30 AM to 4:00 PM ____________________________________________________________________________________________   ____________________________________________________________________________________________  General Risks and Possible Complications  Patient Responsibilities: It is important that you read this as it is part of your informed consent. It is our duty to inform you of the risks and possible complications associated with treatments offered to you. It is your responsibility as a patient to read this and to ask questions about anything that is not clear or that you believe was not covered in this document.  Patient's Rights: You have the right to refuse treatment. You also have the right to change your mind, even after initially having agreed to have the treatment done. However, under this last option, if you wait until the last second to change your mind, you may be charged for the materials used up to that point.  Introduction: Medicine is not an Chief Strategy Officer. Everything in Medicine, including the lack of treatment(s), carries the potential for danger, harm, or loss (which is by definition: Risk). In Medicine, a complication is a secondary problem, condition, or disease that can aggravate an already existing one. All treatments carry the risk of possible complications. The fact that a side effects or complications occurs, does not imply that the treatment was conducted incorrectly. It must be clearly understood that these can happen even when everything is done following the highest safety standards.  No treatment: You can choose not to proceed with the proposed treatment alternative. The "PRO(s)" would include: avoiding the risk of complications associated with the therapy. The "CON(s)" would include: not getting any of the  treatment benefits. These benefits fall under one of three categories: diagnostic; therapeutic; and/or palliative. Diagnostic benefits include: getting information which can ultimately lead to improvement of the disease or symptom(s). Therapeutic benefits are those associated with the successful treatment of the disease. Finally, palliative benefits are those related to the decrease of the primary symptoms, without necessarily curing the condition (example: decreasing the pain from a flare-up of a chronic condition, such as incurable terminal cancer).  General Risks and Complications: These are associated to most interventional treatments. They can occur alone, or in combination. They fall under one of the following six (6) categories: no benefit or worsening of symptoms; bleeding; infection; nerve damage; allergic reactions; and/or death. 1. No benefits or worsening of symptoms: In Medicine there are no guarantees, only probabilities. No healthcare provider can ever guarantee that a medical treatment will work, they can only state the probability that it may. Furthermore, there is always the possibility that the condition may worsen, either directly, or indirectly, as a consequence of the treatment. 2. Bleeding: This is more common if the patient is taking a blood thinner, either prescription or over the counter (example: Goody Powders, Fish oil, Aspirin, Garlic, etc.), or if suffering a condition associated with impaired coagulation (example: Hemophilia, cirrhosis of the liver, low platelet counts, etc.). However, even if you do not have one on these, it can still happen. If you have any of these conditions, or take one of these drugs, make sure to notify your treating physician. 3. Infection: This is more common in patients with a compromised immune system, either due to disease (example: diabetes, cancer, human immunodeficiency virus [HIV], etc.), or due to medications or treatments (example: therapies used  to treat cancer and rheumatological diseases). However, even if you do not have one on these, it can still happen. If you have any of these conditions, or take one of these drugs, make sure to notify your treating physician. 4. Nerve Damage: This is more common when the treatment is an invasive one, but it can also happen with the use of medications, such as those used in the treatment of cancer. The damage can occur to small secondary nerves, or to large primary ones, such as those in the spinal cord and brain. This damage may be temporary or permanent and it may lead to impairments that can range from temporary numbness to permanent paralysis and/or brain death. 5. Allergic Reactions: Any time a substance or material comes in contact with our body, there is the possibility of an allergic reaction. These can range from a mild skin rash (contact dermatitis) to a severe systemic reaction (anaphylactic reaction), which can result in death. 6. Death: In general, any medical intervention can result in death, most of the time due to an unforeseen complication. ____________________________________________________________________________________________

## 2020-05-11 NOTE — Progress Notes (Signed)
Safety precautions to be maintained throughout the outpatient stay will include: orient to surroundings, keep bed in low position, maintain call bell within reach at all times, provide assistance with transfer out of bed and ambulation.  

## 2020-05-20 ENCOUNTER — Other Ambulatory Visit: Payer: Self-pay | Admitting: Gastroenterology

## 2020-05-20 ENCOUNTER — Other Ambulatory Visit: Payer: Self-pay | Admitting: Physician Assistant

## 2020-05-20 DIAGNOSIS — I10 Essential (primary) hypertension: Secondary | ICD-10-CM

## 2020-05-25 ENCOUNTER — Other Ambulatory Visit: Payer: Self-pay | Admitting: Physician Assistant

## 2020-05-25 DIAGNOSIS — Z1231 Encounter for screening mammogram for malignant neoplasm of breast: Secondary | ICD-10-CM

## 2020-05-29 ENCOUNTER — Other Ambulatory Visit: Payer: Self-pay | Admitting: Physician Assistant

## 2020-05-29 DIAGNOSIS — J4520 Mild intermittent asthma, uncomplicated: Secondary | ICD-10-CM

## 2020-05-31 ENCOUNTER — Other Ambulatory Visit: Payer: Self-pay | Admitting: Hematology and Oncology

## 2020-05-31 ENCOUNTER — Other Ambulatory Visit: Payer: Self-pay

## 2020-05-31 ENCOUNTER — Other Ambulatory Visit: Payer: Self-pay | Admitting: Physician Assistant

## 2020-05-31 DIAGNOSIS — N3941 Urge incontinence: Secondary | ICD-10-CM

## 2020-05-31 DIAGNOSIS — I10 Essential (primary) hypertension: Secondary | ICD-10-CM

## 2020-05-31 DIAGNOSIS — J4520 Mild intermittent asthma, uncomplicated: Secondary | ICD-10-CM

## 2020-05-31 DIAGNOSIS — J302 Other seasonal allergic rhinitis: Secondary | ICD-10-CM

## 2020-06-01 MED ORDER — LORATADINE 10 MG PO TABS: 10.0000 mg | ORAL_TABLET | Freq: Every day | ORAL | 0 refills | Status: DC

## 2020-06-01 MED ORDER — LISINOPRIL 5 MG PO TABS: 5.0000 mg | ORAL_TABLET | Freq: Every day | ORAL | 0 refills | Status: DC

## 2020-06-01 MED ORDER — BUSPIRONE HCL 5 MG PO TABS: 5.0000 mg | ORAL_TABLET | Freq: Two times a day (BID) | ORAL | Status: DC

## 2020-06-01 MED ORDER — MIRABEGRON ER 25 MG PO TB24: 25.0000 mg | ORAL_TABLET | Freq: Every day | ORAL | 0 refills | Status: DC

## 2020-06-18 ENCOUNTER — Other Ambulatory Visit: Payer: Self-pay

## 2020-06-18 ENCOUNTER — Other Ambulatory Visit: Payer: Self-pay | Admitting: Hematology and Oncology

## 2020-06-18 ENCOUNTER — Other Ambulatory Visit: Payer: Self-pay | Admitting: Physician Assistant

## 2020-06-18 DIAGNOSIS — J4520 Mild intermittent asthma, uncomplicated: Secondary | ICD-10-CM

## 2020-06-18 DIAGNOSIS — M7918 Myalgia, other site: Secondary | ICD-10-CM

## 2020-06-18 DIAGNOSIS — N3941 Urge incontinence: Secondary | ICD-10-CM

## 2020-06-18 DIAGNOSIS — E559 Vitamin D deficiency, unspecified: Secondary | ICD-10-CM

## 2020-06-18 DIAGNOSIS — J302 Other seasonal allergic rhinitis: Secondary | ICD-10-CM

## 2020-06-18 DIAGNOSIS — I10 Essential (primary) hypertension: Secondary | ICD-10-CM

## 2020-06-18 MED ORDER — MAGNESIUM 500 MG PO CAPS: 500.0000 mg | ORAL_CAPSULE | Freq: Two times a day (BID) | ORAL | 0 refills | Status: DC

## 2020-06-18 MED ORDER — ALBUTEROL SULFATE HFA 108 (90 BASE) MCG/ACT IN AERS: INHALATION_SPRAY | RESPIRATORY_TRACT | 0 refills | Status: DC

## 2020-06-18 MED ORDER — LORATADINE 10 MG PO TABS: 10.0000 mg | ORAL_TABLET | Freq: Every day | ORAL | 0 refills | Status: DC

## 2020-06-18 MED ORDER — LISINOPRIL 5 MG PO TABS: 5.0000 mg | ORAL_TABLET | Freq: Every day | ORAL | 0 refills | Status: DC

## 2020-06-18 MED ORDER — BUSPIRONE HCL 5 MG PO TABS: 5.0000 mg | ORAL_TABLET | Freq: Two times a day (BID) | ORAL | Status: DC

## 2020-06-18 NOTE — Telephone Encounter (Signed)
Patient needs office visit.  

## 2020-06-24 ENCOUNTER — Other Ambulatory Visit: Payer: Self-pay

## 2020-06-24 ENCOUNTER — Ambulatory Visit
Admission: RE | Admit: 2020-06-24 | Discharge: 2020-06-24 | Disposition: A | Discharge status: Home / Self Care | Payer: Medicare Other | Source: Ambulatory Visit | Attending: Physician Assistant | Admitting: Physician Assistant

## 2020-06-24 DIAGNOSIS — Z1231 Encounter for screening mammogram for malignant neoplasm of breast: Secondary | ICD-10-CM

## 2020-06-29 ENCOUNTER — Ambulatory Visit: Payer: Medicare Other | Attending: Pain Medicine | Admitting: Pain Medicine

## 2020-06-29 ENCOUNTER — Other Ambulatory Visit: Payer: Self-pay

## 2020-06-29 DIAGNOSIS — M549 Dorsalgia, unspecified: Secondary | ICD-10-CM

## 2020-06-29 DIAGNOSIS — Z79891 Long term (current) use of opiate analgesic: Secondary | ICD-10-CM

## 2020-06-29 DIAGNOSIS — M5442 Lumbago with sciatica, left side: Secondary | ICD-10-CM | POA: Diagnosis not present

## 2020-06-29 DIAGNOSIS — Z79899 Other long term (current) drug therapy: Secondary | ICD-10-CM

## 2020-06-29 DIAGNOSIS — M79605 Pain in left leg: Secondary | ICD-10-CM | POA: Diagnosis not present

## 2020-06-29 DIAGNOSIS — G894 Chronic pain syndrome: Secondary | ICD-10-CM

## 2020-06-29 DIAGNOSIS — G8929 Other chronic pain: Secondary | ICD-10-CM

## 2020-06-29 DIAGNOSIS — M542 Cervicalgia: Secondary | ICD-10-CM

## 2020-06-29 MED ORDER — TRAMADOL HCL 50 MG PO TABS: 50.0000 mg | ORAL_TABLET | Freq: Four times a day (QID) | ORAL | 2 refills | Status: DC | PRN

## 2020-06-29 NOTE — Progress Notes (Signed)
Patient: Rachael West  Service Category: E/M  Provider: Gaspar Cola, MD  DOB: 05/01/1955  DOS: 06/29/2020  Location: Office  MRN: [id number]  Setting: Ambulatory outpatient  Referring Provider: Lorrene Reid, PA-C  Type: Established Patient  Specialty: Interventional Pain Management  PCP: Lorrene Reid, PA-C  Location: Remote location  Delivery: TeleHealth     Virtual Encounter - Pain Management PROVIDER NOTE: Information contained herein reflects review and annotations entered in association with encounter. Interpretation of such information and data should be left to medically-trained personnel. Information provided to patient can be located elsewhere in the medical record under "Patient Instructions". Document created using STT-dictation technology, any transcriptional errors that may result from process are unintentional.    Contact & Pharmacy Preferred: [phone] Home: [phone] (home) Mobile: [phone] (mobile) E-mail: [email or web address]  [id number] [address]Phone: [phone]: [phone] [address] [address] [id number]: [phone]: [phone]  Pre-screening  [name] offered "in-person" vs "virtual" encounter. She indicated preferring virtual for this encounter.   Reason COVID-19*  Social distancing based on CDC and AMA recommendations.   I contacted LMilton Fergusonon 06/29/2020 via telephone.      I clearly identified myself as FGaspar Cola MD. I verified that I was speaking with the correct person using two identifiers (Name: Rachael West and date of birth: [id number].  Consent I sought verbal advanced consent from LMilton Fergusonfor virtual visit interactions. I informed [name] of possible security and privacy  concerns, risks, and limitations associated with providing "not-in-person" medical evaluation and management services. I also informed [name] of the availability of "in-person" appointments. Finally, I informed her that there would be a charge for the virtual visit and that she could be  personally, fully or partially, financially responsible for it. Ms. MMasinexpressed understanding and agreed to proceed.   Historic Elements   Ms. LROMEY COHEAis a 65y.o. year old, female patient evaluated today after our last contact on 05/11/2020. Ms. MMroz has a past medical history of Anemia, Anxiety, Arthritis, Asthma, Depression, GERD (gastroesophageal reflux disease), Hepatitis-C, History of chemotherapy, Hypertension, Insomnia, [place] Personal history of non-Hodgkin lymphomas, and Vitamin D deficiency. She also  has a past surgical history that includes Appendectomy; Trigger finger release; Esophagogastroduodenoscopy (01/17/2011); Colonoscopy (01/17/2011); Abdominal hysterectomy; and Breast biopsy (Left, 2015). Ms. MRachelshas a current medication list which includes the following prescription(s): albuterol, atenolol, b complex vitamins, buspirone, dimenhydrinate, fluticasone-salmeterol, gabapentin, hydroxyzine, hydroxyzine, ipratropium-albuterol, latanoprost, lisinopril, loratadine, loratadine, lubiprostone, magnesium, meloxicam, mirabegron er, montelukast, omeprazole, ondansetron, paliperidone, polyethylene glycol, prazosin, sertraline, trazodone, [START ON 07/06/2020] tramadol, and benefiber. She  reports that she quit smoking about 30 years ago. Her smoking use included cigarettes. She has a 30.00 pack-year smoking history. She has never used smokeless tobacco. She reports that she does not drink alcohol and does not use drugs. Ms. MGianfrancescois allergic to latex, penicillins, codeine, cortisone, and sulfa antibiotics.   HPI  Today, she is being contacted  for medication management.  The patient indicates doing well with the current medication regimen. No adverse reactions or side effects reported to the medications.   RTCB: 10/04/2020 Nonopioids transfer 10/27/2019: Benefiber, Amitiza, magnesium, and Mobic.  Pharmacotherapy Assessment  Analgesic: Tramadol 50 mg, 2 tabs (100 mg) PO q 6 hrs (400 mg/day of tramadol) MME/day: 40  mg/day.   Monitoring: [place] PMP: PDMP reviewed during this encounter.       Pharmacotherapy: No side-effects or adverse reactions reported. Compliance: No problems identified. Effectiveness: Clinically acceptable. Plan: Refer to "POC".  UDS:  Summary  Date Value Ref Range Status  05/22/2019 Note  Final    Comment:    ==================================================================== [employer] Select 13 (MW) ==================================================================== Test                             Result       Flag       Units Drug Present and Declared for Prescription Verification   Tramadol                       >16129       EXPECTED   ng/mg creat   O-Desmethyltramadol            11787        EXPECTED   ng/mg creat   N-Desmethyltramadol            7242         EXPECTED   ng/mg creat    Source of tramadol is a prescription medication. O-desmethyltramadol    and N-desmethyltramadol are expected metabolites of tramadol. ==================================================================== Test                      Result    Flag   Units      Ref Range   Creatinine              31               mg/dL      >=20 ==================================================================== Declared Medications:  The flagging and interpretation on this report are based on the  following declared medications.  Unexpected results may arise from  inaccuracies in the declared medications.  **Note: The testing scope of this panel includes these medications:  Tramadol (Ultram)  **Note: The testing scope of this panel  does not include the  following reported medications:  Albuterol  Albuterol (Duoneb)  Atenolol  Buspirone  Calcium  Dimenhydrinate  Eye Drop  Fluticasone (Advair)  Hydroxyzine (Vistaril)  Ipratropium (Duoneb)  Lisinopril (Zestril)  Lubiprostone (Amitiza)  Magnesium  Mirabegron (Myrbetriq)  Montelukast (Singulair)  Omeprazole (Prilosec)  Ondansetron (Zofran)  Paliperidone (Invega)  Polyethylene Glycol (MiraLAX)  Prazosin (Minipress)  Salmeterol (Advair)  Sertraline (Zoloft)  Supplement  Trazodone (Desyrel)  Vitamin D ==================================================================== For clinical consultation, please call [phone]. ====================================================================     Laboratory Chemistry Profile   Renal Lab Results  Component Value Date   BUN 7 (L) 03/25/2020   CREATININE 0.77 03/25/2020   BCR 9 (L) 03/25/2020   GFRAA >60 08/05/2019   GFRNONAA >60 08/05/2019     Hepatic Lab Results  Component Value Date   AST 14 03/25/2020   ALT 11 03/25/2020   ALBUMIN 4.1 03/25/2020   ALKPHOS 96 03/25/2020   HCVAB REACTIVE (A) 01/12/2014   LIPASE 34 10/31/2010     Electrolytes Lab Results  Component Value Date   NA 143 03/25/2020   K 5.2 03/25/2020   CL 104 03/25/2020   CALCIUM 9.5 03/25/2020   MG 2.2 08/29/2017     Bone Lab Results  Component Value Date   25OHVITD1 21 (L) 08/29/2017   25OHVITD2 <1.0 08/29/2017   25OHVITD3 21 08/29/2017     Inflammation (CRP: Acute Phase) (ESR:  Chronic Phase) Lab Results  Component Value Date   CRP 12 (H) 08/29/2017   ESRSEDRATE 46 (H) 08/29/2017       Note: Above Lab results reviewed.  Imaging  MM 3D SCREEN BREAST BILATERAL CLINICAL DATA:  Screening.  EXAM: DIGITAL SCREENING BILATERAL MAMMOGRAM WITH TOMOSYNTHESIS AND CAD  TECHNIQUE: Bilateral screening digital craniocaudal and mediolateral oblique mammograms were obtained. Bilateral screening digital  breast tomosynthesis was performed. The images were evaluated with computer-aided detection.  COMPARISON:  Previous exam(s).  ACR Breast Density Category b: There are scattered areas of fibroglandular density.  FINDINGS: There are no findings suspicious for malignancy.  IMPRESSION: No mammographic evidence of malignancy. A result letter of this screening mammogram will be mailed directly to the patient.  RECOMMENDATION: Screening mammogram in one year. (Code:SM-B-01Y)  BI-RADS CATEGORY  1: Negative.  Electronically Signed   By: Ammie Ferrier M.D.   On: 06/26/2020 13:17  Assessment  The primary encounter diagnosis was Chronic pain syndrome. Diagnoses of Chronic low back pain (Primary Area of Pain) (Bilateral) w/ sciatica (Left), Chronic lower extremity pain (2ry area of Pain) (Left), Chronic upper back pain (3ry area of Pain) (Bilateral) (L>R), Chronic neck pain (4th area of Pain) (Bilateral) (L>R), Pharmacologic therapy, and Chronic use of opiate for therapeutic purpose were also pertinent to this visit.  Plan of Care  Problem-specific:  No problem-specific Assessment & Plan notes found for this encounter.  Ms. RAMONITA KOENIG has a current medication list which includes the following long-term medication(s): albuterol, atenolol, fluticasone-salmeterol, gabapentin, ipratropium-albuterol, lisinopril, loratadine, loratadine, lubiprostone, magnesium, meloxicam, mirabegron er, montelukast, omeprazole, paliperidone, prazosin, sertraline, trazodone, [START ON 07/06/2020] tramadol, and benefiber.  Pharmacotherapy (Medications Ordered): Meds ordered this encounter  Medications   traMADol (ULTRAM) 50 MG tablet    Sig: Take 1-2 tablets (50-100 mg total) by mouth every 6 (six) hours as needed for severe pain. Must last 30 days    Dispense:  170 tablet    Refill:  2    Not a duplicate. Do NOT delete! Dispense 1 day early if closed on refill date. Avoid benzodiazepines within 8  hours of opioids. Do not send refill requests.    Orders:  No orders of the defined types were placed in this encounter.  Follow-up plan:   No follow-ups on file.     Interventional Therapies  Risk  Complexity Considerations:   Estimated body mass index is 30.12 kg/m as calculated from the following:   Height as of this encounter: 5' 5"  (1.651 m).   Weight as of this encounter: 181 lb (82.1 kg). NOTE: LATEX ALLERGY & Hx Hepatitis C (w/ cirrhosis)   Planned  Pending:   Therapeutic right IA shoulder joint and AC joint injection #2 + right trapezius muscle TPI #1  Evaluation of left hip area pain    Under consideration:   Diagnostic left LESI Possible bilateral lumbar facet RFA Diagnostic  bilateral thoracic facet block Possible bilateral thoracic facet RFA Possible bilateral cervical facet RFA Diagnostic bilateral GONB Diagnostic trigger point injections    Completed:   Diagnostic right shoulder injection x1  Palliative/diagnostic bilateral lumbar facet block x3 (100/100/90/90) Palliative/diagnostic bilateral cervical facet block x1    Therapeutic  Palliative (PRN) options:   Diagnostic right shoulder injection #2  Palliative/diagnostic bilateral lumbar facet block #4  Palliative/diagnostic bilateral cervical facet block #2         Recent Visits Date Type Provider Dept  05/11/20 Office Visit Milinda Pointer, [facility]  05/04/20 Procedure  visit Milinda Pointer, MD [facility]  04/15/20 Procedure visit Milinda Pointer, MD [facility]  04/07/20 Office Visit Milinda Pointer, MD [facility]  Showing recent visits within past 90 days and meeting all other requirements Today's Visits Date Type Provider Dept  06/29/20 Telemedicine Milinda Pointer, MD [facility]  Showing today's visits and meeting all other requirements Future Appointments Date Type Provider Dept  07/26/20 Appointment Milinda Pointer, MD [facility]  Showing future appointments within next 90 days and meeting all other requirements I discussed the assessment and treatment plan with the patient. The patient was provided an opportunity to ask questions and all were answered. The patient agreed with the plan and demonstrated an understanding of the instructions.  Patient advised to call back or seek an in-person evaluation if the symptoms or condition worsens.  Duration of encounter: 12 minutes.  Note by: Gaspar Cola, MD Date: 06/29/2020; Time: 3:51 PM

## 2020-06-30 ENCOUNTER — Encounter: Payer: Self-pay | Admitting: Nurse Practitioner

## 2020-07-05 ENCOUNTER — Encounter: Payer: Medicare Other | Admitting: Pain Medicine

## 2020-07-07 ENCOUNTER — Other Ambulatory Visit: Payer: Self-pay | Admitting: Physician Assistant

## 2020-07-07 DIAGNOSIS — J4520 Mild intermittent asthma, uncomplicated: Secondary | ICD-10-CM

## 2020-07-21 ENCOUNTER — Other Ambulatory Visit: Payer: Self-pay | Admitting: Physician Assistant

## 2020-07-21 DIAGNOSIS — J4520 Mild intermittent asthma, uncomplicated: Secondary | ICD-10-CM

## 2020-07-25 ENCOUNTER — Other Ambulatory Visit: Payer: Self-pay | Admitting: Physician Assistant

## 2020-07-25 ENCOUNTER — Other Ambulatory Visit: Payer: Self-pay | Admitting: Gastroenterology

## 2020-07-25 DIAGNOSIS — J4520 Mild intermittent asthma, uncomplicated: Secondary | ICD-10-CM

## 2020-07-25 NOTE — Progress Notes (Signed)
PROVIDER NOTE: Information contained herein reflects review and annotations entered in association with encounter. Interpretation of such information and data should be left to medically-trained personnel. Information provided to patient can be located elsewhere in the medical record under "Patient Instructions". Document created using STT-dictation technology, any transcriptional errors that may result from process are unintentional.    Patient: Rachael West  Service Category: E/M  Provider: Gaspar Cola, MD  DOB: 1955/03/21  DOS: 07/26/2020  Specialty: Interventional Pain Management  MRN: [id number]  Setting: Ambulatory outpatient  PCP: Lorrene Reid, PA-C  Type: Established Patient    Referring Provider: Lorrene Reid, PA-C  Location: Office  Delivery: Face-to-face     HPI  [name], a 65 y.o. year old female, is here today because of her Chronic pain syndrome [G89.4]. Ms. Rullo primary complain today is Shoulder Pain Last encounter: My last encounter with her was on 05/11/2020. Pertinent problems: Ms. Boehler has Large B-cell lymphoma ([place]); Atypical chest pain; Chronic low back pain (Primary Area of Pain) (Bilateral) w/ sciatica (Left); Chronic lower extremity pain (2ry area of Pain) (Left); Chronic upper back pain (3ry area of Pain) (Bilateral) (L>R); Chronic neck pain (4th area of Pain) (Bilateral) (L>R); Occipital headache; Chronic knee pain (Bilateral) (R>L); Chronic pain syndrome; Chronic sacroiliac joint pain; DDD (degenerative disc disease), lumbar; Lumbar facet arthropathy; Lumbar facet syndrome (Bilateral) (L>R); Spondylosis without myelopathy or radiculopathy, lumbar region; DDD (degenerative disc disease), thoracic; Thoracolumbar dextroscoliosis; Cervicogenic headache; Tendinitis of rotator cuff (Left); Tendinitis of rotator cuff (Right); Fibromyalgia; Chronic shoulder pain (Bilateral) (R>L); Chronic hip pain (Bilateral) (L>R); Cervicalgia;  Chronic idiopathic gout; DDD (degenerative disc disease), cervical; 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); Cervical facet syndrome (Bilateral) (R>L); Spondylosis without myelopathy or radiculopathy, cervical region; Chronic musculoskeletal pain; Chronic low back pain (1ry area of Pain) (Bilateral) (L>R) w/o sciatica; Cervical disc disorder at C5-C6 level with radiculopathy (Right); Cervical radiculopathy at C6 (Right); Osteoarthrosis of multiple sites; Radicular pain of upper extremity (Right); Chronic upper extremity pain (Right); Chronic shoulder pain (Right); Arthralgia of shoulder region (Right); Shoulder enthesopathy (Right); Musculoskeletal disorder involving upper trapezius muscle (Right); Abnormal MRI, cervical spine (11/12/2019); Chronic neuropathic pain; Disturbance of skin sensation; and Hyperalgesia on their pertinent problem list. Pain Assessment: Severity of Chronic pain is reported as a 6 /10. Location: Shoulder Right/pain radiaties down her right arm, down to her finger. Onset: More than a month ago. Quality: Aching, Dull, Constant, Numbness. Timing: Constant. Modifying factor(s): meds, heating pad. Vitals:  height is $RemoveB'5\' 5"'ksaqwcEr$  (1.651 m) and weight is 185 lb (83.9 kg). Her temporal temperature is 97.4 F (36.3 C) (abnormal). Her blood pressure is 132/76 and her pulse is 95. Her respiration is 16 and oxygen saturation is 98%.   Reason for encounter: medication management.   The patient indicates doing well with the current medication regimen. No adverse reactions or side effects reported to the medications.  The patient indicates doing extremely well with the gabapentin.  She describes that she is able to sleep much better and she has not had to take the trazodone to help her sleep.  Today I will provide the patient with a refill on her gabapentin and we will be transferring this to her PCP.  We will continue managing her opioid analgesics.  RTCB: 01/02/2021 Nonopioids transfer  10/27/2019: Benefiber, Amitiza, magnesium, and Mobic.  Pharmacotherapy Assessment  Analgesic: Tramadol 50 mg, 2 tabs (100 mg) PO q 6 hrs (400 mg/day of tramadol) MME/day: 40 mg/day.  Monitoring: [place] PMP: PDMP reviewed during this encounter.       Pharmacotherapy: No side-effects or adverse reactions reported. Compliance: No problems identified. Effectiveness: Clinically acceptable.  Chauncey Fischer, RN  07/26/2020  1:56 PM  Sign when Signing Visit Nursing Pain Medication Assessment:  Safety precautions to be maintained throughout the outpatient stay will include: orient to surroundings, keep bed in low position, maintain call bell within reach at all times, provide assistance with transfer out of bed and ambulation.  Medication Inspection Compliance:  Pt brought in 12 unknown pill in an unmarked bottle  Medication: Tramadol (Ultram) Pill/Patch Count:  12 Pill/Patch Appearance:  pill has the number 58 on them Bottle Appearance: No label. Patient informed that medications must be transported in properly and accurately labeled containers. Filled Date: 0 / 0 /  22 Last Medication intake:  Today  Pills was searched under pill identifier and Tramadol $RemoveBefo'50mg'fcARvuJRwva$  was identified.   Safety precautions to be maintained throughout the outpatient stay will include: orient to surroundings, keep bed in low position, maintain call bell within reach at all times, provide assistance with transfer out of bed and ambulation.      UDS:  Summary  Date Value Ref Range Status  05/22/2019 Note  Final    Comment:    ==================================================================== ToxASSURE Select 13 (MW) ==================================================================== Test                             Result       Flag       Units Drug Present and Declared for Prescription Verification   Tramadol                       >16129       EXPECTED   ng/mg creat   O-Desmethyltramadol            11787         EXPECTED   ng/mg creat   N-Desmethyltramadol            7242         EXPECTED   ng/mg creat    Source of tramadol is a prescription medication. O-desmethyltramadol    and N-desmethyltramadol are expected metabolites of tramadol. ==================================================================== Test                      Result    Flag   Units      Ref Range   Creatinine              31               mg/dL      >=20 ==================================================================== Declared Medications:  The flagging and interpretation on this report are based on the  following declared medications.  Unexpected results may arise from  inaccuracies in the declared medications.  **Note: The testing scope of this panel includes these medications:  Tramadol (Ultram)  **Note: The testing scope of this panel does not include the  following reported medications:  Albuterol  Albuterol (Duoneb)  Atenolol  Buspirone  Calcium  Dimenhydrinate  Eye Drop  Fluticasone (Advair)  Hydroxyzine (Vistaril)  Ipratropium (Duoneb)  Lisinopril (Zestril)  Lubiprostone (Amitiza)  Magnesium  Mirabegron (Myrbetriq)  Montelukast (Singulair)  Omeprazole (Prilosec)  Ondansetron (Zofran)  Paliperidone (Invega)  Polyethylene Glycol (MiraLAX)  Prazosin (Minipress)  Salmeterol (Advair)  Sertraline (Zoloft)  Supplement  Trazodone (Desyrel)  Vitamin D ====================================================================  For clinical consultation, please call [phone]. ====================================================================      ROS  Constitutional: Denies any fever or chills Gastrointestinal: No reported hemesis, hematochezia, vomiting, or acute GI distress Musculoskeletal: Denies any acute onset joint swelling, redness, loss of ROM, or weakness Neurological: No reported episodes of acute onset apraxia, aphasia, dysarthria, agnosia, amnesia, paralysis, loss of coordination, or loss  of consciousness  Medication Review  Benefiber, Fluticasone-Salmeterol, Magnesium, albuterol, atenolol, b complex vitamins, busPIRone, dimenhyDRINATE, gabapentin, hydrOXYzine, ipratropium-albuterol, latanoprost, lisinopril, loratadine, lubiprostone, meloxicam, mirabegron ER, montelukast, omeprazole, ondansetron, paliperidone, polyethylene glycol, prazosin, sertraline, traMADol, and traZODone  History Review  Allergy: Ms. Escajeda is allergic to latex, penicillins, codeine, cortisone, and sulfa antibiotics. Drug: Ms. Navarrete  reports no history of drug use. Alcohol:  reports no history of alcohol use. Tobacco:  reports that she quit smoking about 30 years ago. Her smoking use included cigarettes. She has a 30.00 pack-year smoking history. She has never used smokeless tobacco. Social: Ms. Barrientes  reports that she quit smoking about 30 years ago. Her smoking use included cigarettes. She has a 30.00 pack-year smoking history. She has never used smokeless tobacco. She reports that she does not drink alcohol and does not use drugs. Medical:  has a past medical history of Anemia, Anxiety, Arthritis, Asthma, Depression, GERD (gastroesophageal reflux disease), Hepatitis-C, History of chemotherapy, Hypertension, Insomnia, Lymphoma ([place]) (12/2013), Personal history of non-Hodgkin lymphomas, and Vitamin D deficiency. Surgical: Ms. Tague  has a past surgical history that includes Appendectomy; Trigger finger release; Esophagogastroduodenoscopy (01/17/2011); Colonoscopy (01/17/2011); Abdominal hysterectomy; and Breast biopsy (Left, 2015). Family: family history includes Allergies in her mother; Asthma in her mother and sister; Breast cancer in her cousin; Colon cancer in her paternal aunt; Diabetes in her maternal grandfather and maternal grandmother; Heart disease in her brother, father, paternal grandfather, and paternal grandmother; Liver cancer in her paternal uncle; Ovarian cancer in an  other family member; Pancreatic cancer in an other family member.  Laboratory Chemistry Profile   Renal Lab Results  Component Value Date   BUN 7 (L) 03/25/2020   CREATININE 0.77 03/25/2020   BCR 9 (L) 03/25/2020   GFRAA >60 08/05/2019   GFRNONAA >60 08/05/2019    Hepatic Lab Results  Component Value Date   AST 14 03/25/2020   ALT 11 03/25/2020   ALBUMIN 4.1 03/25/2020   ALKPHOS 96 03/25/2020   HCVAB REACTIVE (A) 01/12/2014   LIPASE 34 10/31/2010    Electrolytes Lab Results  Component Value Date   NA 143 03/25/2020   K 5.2 03/25/2020   CL 104 03/25/2020   CALCIUM 9.5 03/25/2020   MG 2.2 08/29/2017    Bone Lab Results  Component Value Date   25OHVITD1 21 (L) 08/29/2017   25OHVITD2 <1.0 08/29/2017   25OHVITD3 21 08/29/2017    Inflammation (CRP: Acute Phase) (ESR: Chronic Phase) Lab Results  Component Value Date   CRP 12 (H) 08/29/2017   ESRSEDRATE 46 (H) 08/29/2017         Note: Above Lab results reviewed.  Recent Imaging Review  MM 3D SCREEN BREAST BILATERAL CLINICAL DATA:  Screening.  EXAM: DIGITAL SCREENING BILATERAL MAMMOGRAM WITH TOMOSYNTHESIS AND CAD  TECHNIQUE: Bilateral screening digital craniocaudal and mediolateral oblique mammograms were obtained. Bilateral screening digital breast tomosynthesis was performed. The images were evaluated with computer-aided detection.  COMPARISON:  Previous exam(s).  ACR Breast Density Category b: There are scattered areas of fibroglandular density.  FINDINGS: There are no findings suspicious for malignancy.  IMPRESSION: No mammographic evidence of  malignancy. A result letter of this screening mammogram will be mailed directly to the patient.  RECOMMENDATION: Screening mammogram in one year. (Code:SM-B-01Y)  BI-RADS CATEGORY  1: Negative.  Electronically Signed   By: Ammie Ferrier M.D.   On: 06/26/2020 13:17 Note: Reviewed        Physical Exam  General appearance: Well nourished, well  developed, and well hydrated. In no apparent acute distress Mental status: Alert, oriented x 3 (person, place, & time)       Respiratory: No evidence of acute respiratory distress Eyes: PERLA Vitals: BP 132/76   Pulse 95   Temp (!) 97.4 F (36.3 C) (Temporal)   Resp 16   Ht $R'5\' 5"'KX$  (1.651 m)   Wt 185 lb (83.9 kg)   SpO2 98%   BMI 30.79 kg/m  BMI: Estimated body mass index is 30.79 kg/m as calculated from the following:   Height as of this encounter: $RemoveBeforeD'5\' 5"'NbqMGbZvKXdRQH$  (1.651 m).   Weight as of this encounter: 185 lb (83.9 kg). Ideal: Ideal body weight: 57 kg (125 lb 10.6 oz) Adjusted ideal body weight: 67.8 kg (149 lb 6.4 oz)  Assessment   Status Diagnosis  Controlled Controlled Controlled 1. Chronic pain syndrome   2. Chronic low back pain (1ry area of Pain) (Bilateral) (L>R) w/o sciatica   3. Chronic lower extremity pain (2ry area of Pain) (Left)   4. Chronic upper back pain (3ry area of Pain) (Bilateral) (L>R)   5. Chronic neck pain (4th area of Pain) (Bilateral) (L>R)   6. Pharmacologic therapy   7. Chronic use of opiate for therapeutic purpose   8. Encounter for medication management   9. Fibromyalgia   10. Chronic neuropathic pain   11. Disturbance of skin sensation   12. Hyperalgesia   13. Chronic low back pain (Primary Area of Pain) (Bilateral) w/ sciatica (Left)      Updated Problems: Problem  Chronic Use of Opiate for Therapeutic Purpose    Plan of Care  Problem-specific:  No problem-specific Assessment & Plan notes found for this encounter.  Ms. GLORENE LEITZKE has a current medication list which includes the following long-term medication(s): albuterol, atenolol, fluticasone-salmeterol, ipratropium-albuterol, lisinopril, loratadine, loratadine, magnesium, mirabegron er, montelukast, omeprazole, paliperidone, prazosin, sertraline, tramadol, trazodone, gabapentin, lubiprostone, meloxicam, [START ON 10/04/2020] tramadol, and benefiber.  Pharmacotherapy (Medications  Ordered): Meds ordered this encounter  Medications   gabapentin (NEURONTIN) 100 MG capsule    Sig: Take 2 capsules (200 mg total) by mouth at bedtime. Follow written titration schedule.    Dispense:  60 capsule    Refill:  5    Fill one day early if pharmacy is closed on scheduled refill date. May substitute for generic if available.   traMADol (ULTRAM) 50 MG tablet    Sig: Take 1-2 tablets (50-100 mg total) by mouth every 6 (six) hours as needed for severe pain. Must last 30 days    Dispense:  170 tablet    Refill:  2    Not a duplicate. Do NOT delete! Dispense 1 day early if closed on refill date. Avoid benzodiazepines within 8 hours of opioids. Do not send refill requests.    Orders:  Orders Placed This Encounter  Procedures   ToxASSURE Select 13 (MW), Urine    Volume: 30 ml(s). Minimum 3 ml of urine is needed. Document temperature of fresh sample. Indications: Long term (current) use of opiate analgesic (V69.450)    Order Specific Question:   Release to patient  Answer:   Immediate    Follow-up plan:   Return in about 5 months (around 01/02/2021) for evaluation day (F2F) (MM).     Interventional Therapies  Risk  Complexity Considerations:   Estimated body mass index is 30.12 kg/m as calculated from the following:   Height as of this encounter: $RemoveBeforeD'5\' 5"'RvJmcSKSnLymyv$  (1.651 m).   Weight as of this encounter: 181 lb (82.1 kg). NOTE: LATEX ALLERGY & Hx Hepatitis C (w/ cirrhosis)   Planned  Pending:   Therapeutic right IA shoulder joint and AC joint injection #2 + right trapezius muscle TPI #1  Evaluation of left hip area pain    Under consideration:   Diagnostic left LESI Possible bilateral lumbar facet RFA Diagnostic  bilateral thoracic facet block Possible bilateral thoracic facet RFA Possible bilateral cervical facet RFA Diagnostic bilateral GONB Diagnostic trigger point injections    Completed:   Diagnostic right shoulder injection x1  Palliative/diagnostic bilateral  lumbar facet block x3 (100/100/90/90) Palliative/diagnostic bilateral cervical facet block x1    Therapeutic  Palliative (PRN) options:   Diagnostic right shoulder injection #2  Palliative/diagnostic bilateral lumbar facet block #4  Palliative/diagnostic bilateral cervical facet block #2          Recent Visits Date Type Provider Dept  06/29/20 Telemedicine Milinda Pointer, MD [facility]  05/11/20 Office Visit Milinda Pointer, MD [facility]  05/04/20 Procedure visit Milinda Pointer, MD [facility]  Showing recent visits within past 90 days and meeting all other requirements Today's Visits Date Type Provider Dept  07/26/20 Office Visit Milinda Pointer, MD [facility]  Showing today's visits and meeting all other requirements Future Appointments No visits were found meeting these conditions. Showing future appointments within next 90 days and meeting all other requirements I discussed the assessment and treatment plan with the patient. The patient was provided an opportunity to ask questions and all were answered. The patient agreed with the plan and demonstrated an understanding of the instructions.  Patient advised to call back or seek an in-person evaluation if the symptoms or condition worsens.  Duration of encounter: 30 minutes.  Note by: Gaspar Cola, MD Date: 07/26/2020; Time: 2:19 PM

## 2020-07-26 ENCOUNTER — Encounter: Payer: Self-pay | Admitting: Pain Medicine

## 2020-07-26 ENCOUNTER — Other Ambulatory Visit: Payer: Self-pay

## 2020-07-26 ENCOUNTER — Ambulatory Visit: Payer: Medicare Other | Attending: Pain Medicine | Admitting: Pain Medicine

## 2020-07-26 VITALS — BP 132/76 | HR 95 | Temp 97.4°F | Resp 16 | Ht 65.0 in | Wt 185.0 lb

## 2020-07-26 DIAGNOSIS — Z79899 Other long term (current) drug therapy: Secondary | ICD-10-CM | POA: Diagnosis present

## 2020-07-26 DIAGNOSIS — M549 Dorsalgia, unspecified: Secondary | ICD-10-CM | POA: Insufficient documentation

## 2020-07-26 DIAGNOSIS — R209 Unspecified disturbances of skin sensation: Secondary | ICD-10-CM | POA: Diagnosis present

## 2020-07-26 DIAGNOSIS — R208 Other disturbances of skin sensation: Secondary | ICD-10-CM | POA: Insufficient documentation

## 2020-07-26 DIAGNOSIS — Z79891 Long term (current) use of opiate analgesic: Secondary | ICD-10-CM | POA: Diagnosis present

## 2020-07-26 DIAGNOSIS — M545 Low back pain, unspecified: Secondary | ICD-10-CM | POA: Diagnosis not present

## 2020-07-26 DIAGNOSIS — M797 Fibromyalgia: Secondary | ICD-10-CM | POA: Insufficient documentation

## 2020-07-26 DIAGNOSIS — G8929 Other chronic pain: Secondary | ICD-10-CM | POA: Insufficient documentation

## 2020-07-26 DIAGNOSIS — M792 Neuralgia and neuritis, unspecified: Secondary | ICD-10-CM | POA: Diagnosis present

## 2020-07-26 DIAGNOSIS — M5442 Lumbago with sciatica, left side: Secondary | ICD-10-CM | POA: Diagnosis present

## 2020-07-26 DIAGNOSIS — M79605 Pain in left leg: Secondary | ICD-10-CM | POA: Insufficient documentation

## 2020-07-26 DIAGNOSIS — G894 Chronic pain syndrome: Secondary | ICD-10-CM | POA: Diagnosis not present

## 2020-07-26 DIAGNOSIS — M542 Cervicalgia: Secondary | ICD-10-CM

## 2020-07-26 MED ORDER — TRAMADOL HCL 50 MG PO TABS: 50.0000 mg | ORAL_TABLET | Freq: Four times a day (QID) | ORAL | 2 refills | Status: DC | PRN

## 2020-07-26 MED ORDER — GABAPENTIN 100 MG PO CAPS: 200.0000 mg | ORAL_CAPSULE | Freq: Every day | ORAL | 5 refills | Status: DC

## 2020-07-26 NOTE — Patient Instructions (Signed)
____________________________________________________________________________________________  Medication Rules  Purpose: To inform patients, and their family members, of our rules and regulations.  Applies to: All patients receiving prescriptions (written or electronic).  Pharmacy of record: Pharmacy where electronic prescriptions will be sent. If written prescriptions are taken to a different pharmacy, please inform the nursing staff. The pharmacy listed in the electronic medical record should be the one where you would like electronic prescriptions to be sent.  Electronic prescriptions: In compliance with the [place] Strengthen Opioid Misuse Prevention (STOP) Act of 2017 (Session Law 2017-74/H243), effective January 09, 2018, all controlled substances must be electronically prescribed. Calling prescriptions to the pharmacy will cease to exist.  Prescription refills: Only during scheduled appointments. Applies to all prescriptions.  NOTE: The following applies primarily to controlled substances (Opioid* Pain Medications).   Type of encounter (visit): For patients receiving controlled substances, face-to-face visits are required. (Not an option or up to the patient.)  Patient's responsibilities: Pain Pills: Bring all pain pills to every appointment (except for procedure appointments). Pill Bottles: Bring pills in original pharmacy bottle. Always bring the newest bottle. Bring bottle, even if empty. Medication refills: You are responsible for knowing and keeping track of what medications you take and those you need refilled. The day before your appointment: write a list of all prescriptions that need to be refilled. The day of the appointment: give the list to the admitting nurse. Prescriptions will be written only during appointments. No prescriptions will be written on procedure days. If you forget a medication: it will not be "Called in", "Faxed", or "electronically sent". You will  need to get another appointment to get these prescribed. No early refills. Do not call asking to have your prescription filled early. Prescription Accuracy: You are responsible for carefully inspecting your prescriptions before leaving our office. Have the discharge nurse carefully go over each prescription with you, before taking them home. Make sure that your name is accurately spelled, that your address is correct. Check the name and dose of your medication to make sure it is accurate. Check the number of pills, and the written instructions to make sure they are clear and accurate. Make sure that you are given enough medication to last until your next medication refill appointment. Taking Medication: Take medication as prescribed. When it comes to controlled substances, taking less pills or less frequently than prescribed is permitted and encouraged. Never take more pills than instructed. Never take medication more frequently than prescribed.  Inform other Doctors: Always inform, all of your healthcare providers, of all the medications you take. Pain Medication from other Providers: You are not allowed to accept any additional pain medication from any other Doctor or Healthcare provider. There are two exceptions to this rule. (see below) In the event that you require additional pain medication, you are responsible for notifying us, as stated below. Cough Medicine: Often these contain an opioid, such as codeine or hydrocodone. Never accept or take cough medicine containing these opioids if you are already taking an opioid* medication. The combination may cause respiratory failure and death. Medication Agreement: You are responsible for carefully reading and following our Medication Agreement. This must be signed before receiving any prescriptions from our practice. Safely store a copy of your signed Agreement. Violations to the Agreement will result in no further prescriptions. (Additional copies of our  Medication Agreement are available upon request.) Laws, Rules, & Regulations: All patients are expected to follow all Federal and State Laws, Statutes, Rules, & Regulations. Ignorance of   the Laws does not constitute a valid excuse.  Illegal drugs and Controlled Substances: The use of illegal substances (including, but not limited to marijuana and its derivatives) and/or the illegal use of any controlled substances is strictly prohibited. Violation of this rule may result in the immediate and permanent discontinuation of any and all prescriptions being written by our practice. The use of any illegal substances is prohibited. Adopted CDC guidelines & recommendations: Target dosing levels will be at or below 60 MME/day. Use of benzodiazepines** is not recommended.  Exceptions: There are only two exceptions to the rule of not receiving pain medications from other Healthcare Providers. Exception #1 (Emergencies): In the event of an emergency (i.e.: accident requiring emergency care), you are allowed to receive additional pain medication. However, you are responsible for: As soon as you are able, call our office [phone], at any time of the day or night, and leave a message stating your name, the date and nature of the emergency, and the name and dose of the medication prescribed. In the event that your call is answered by a member of our staff, make sure to document and save the date, time, and the name of the person that took your information.  Exception #2 (Planned Surgery): In the event that you are scheduled by another doctor or dentist to have any type of surgery or procedure, you are allowed (for a period no longer than 30 days), to receive additional pain medication, for the acute post-op pain. However, in this case, you are responsible for picking up a copy of our "Post-op Pain Management for Surgeons" handout, and giving it to your surgeon or dentist. This document is available at our office, and  does not require an appointment to obtain it. Simply go to our office during business hours (Monday-Thursday from 8:00 AM to 4:00 PM) (Friday 8:00 AM to 12:00 Noon) or if you have a scheduled appointment with us, prior to your surgery, and ask for it by name. In addition, you are responsible for: calling our office [phone], at any time of the day or night, and leaving a message stating your name, name of your surgeon, type of surgery, and date of procedure or surgery. Failure to comply with your responsibilities may result in termination of therapy involving the controlled substances.  *Opioid medications include: morphine, codeine, oxycodone, oxymorphone, hydrocodone, hydromorphone, meperidine, tramadol, tapentadol, buprenorphine, fentanyl, methadone. **Benzodiazepine medications include: diazepam (Valium), alprazolam (Xanax), clonazepam (Klonopine), lorazepam (Ativan), clorazepate (Tranxene), chlordiazepoxide (Librium), estazolam (Prosom), oxazepam (Serax), temazepam (Restoril), triazolam (Halcion) (Last updated: 12/08/2019) ____________________________________________________________________________________________  ____________________________________________________________________________________________  Medication Recommendations and Reminders  Applies to: All patients receiving prescriptions (written and/or electronic).  Medication Rules & Regulations: These rules and regulations exist for your safety and that of others. They are not flexible and neither are we. Dismissing or ignoring them will be considered "non-compliance" with medication therapy, resulting in complete and irreversible termination of such therapy. (See document titled "Medication Rules" for more details.) In all conscience, because of safety reasons, we cannot continue providing a therapy where the patient does not follow instructions.  Pharmacy of record:  Definition: This is the pharmacy where your electronic  prescriptions will be sent.  We do not endorse any particular pharmacy, however, we have experienced problems with [facility] not securing enough medication supply for the community. We do not restrict you in your choice of pharmacy. However, once we write for your prescriptions, we will NOT be re-sending more prescriptions to fix restricted supply problems   created by your pharmacy, or your insurance.  The pharmacy listed in the electronic medical record should be the one where you want electronic prescriptions to be sent. If you choose to change pharmacy, simply notify our nursing staff.  Recommendations: Keep all of your pain medications in a safe place, under lock and key, even if you live alone. We will NOT replace lost, stolen, or damaged medication. After you fill your prescription, take 1 week's worth of pills and put them away in a safe place. You should keep a separate, properly labeled bottle for this purpose. The remainder should be kept in the original bottle. Use this as your primary supply, until it runs out. Once it's gone, then you know that you have 1 week's worth of medicine, and it is time to come in for a prescription refill. If you do this correctly, it is unlikely that you will ever run out of medicine. To make sure that the above recommendation works, it is very important that you make sure your medication refill appointments are scheduled at least 1 week before you run out of medicine. To do this in an effective manner, make sure that you do not leave the office without scheduling your next medication management appointment. Always ask the nursing staff to show you in your prescription , when your medication will be running out. Then arrange for the receptionist to get you a return appointment, at least 7 days before you run out of medicine. Do not wait until you have 1 or 2 pills left, to come in. This is very poor planning and does not take into consideration that we may need to  cancel appointments due to bad weather, sickness, or emergencies affecting our staff. DO NOT ACCEPT A "Partial Fill": If for any reason your pharmacy does not have enough pills/tablets to completely fill or refill your prescription, do not allow for a "partial fill". The law allows the pharmacy to complete that prescription within 72 hours, without requiring a new prescription. If they do not fill the rest of your prescription within those 72 hours, you will need a separate prescription to fill the remaining amount, which we will NOT provide. If the reason for the partial fill is your insurance, you will need to talk to the pharmacist about payment alternatives for the remaining tablets, but again, DO NOT ACCEPT A PARTIAL FILL, unless you can trust your pharmacist to obtain the remainder of the pills within 72 hours.  Prescription refills and/or changes in medication(s):  Prescription refills, and/or changes in dose or medication, will be conducted only during scheduled medication management appointments. (Applies to both, written and electronic prescriptions.) No refills on procedure days. No medication will be changed or started on procedure days. No changes, adjustments, and/or refills will be conducted on a procedure day. Doing so will interfere with the diagnostic portion of the procedure. No phone refills. No medications will be "called into the pharmacy". No Fax refills. No weekend refills. No Holliday refills. No after hours refills.  Remember:  Business hours are:  Monday to Thursday 8:00 AM to 4:00 PM Provider's Schedule: Tisheena Maguire, MD - Appointments are:  Medication management: Monday and Wednesday 8:00 AM to 4:00 PM Procedure day: Tuesday and Thursday 7:30 AM to 4:00 PM Bilal Lateef, MD - Appointments are:  Medication management: Tuesday and Thursday 8:00 AM to 4:00 PM Procedure day: Monday and Wednesday 7:30 AM to 4:00 PM (Last update:  07/30/2019) ____________________________________________________________________________________________  ____________________________________________________________________________________________  CBD (cannabidiol) WARNING    Applicable to: All individuals currently taking or considering taking CBD (cannabidiol) and, more important, all patients taking opioid analgesic controlled substances (pain medication). (Example: oxycodone; oxymorphone; hydrocodone; hydromorphone; morphine; methadone; tramadol; tapentadol; fentanyl; buprenorphine; butorphanol; dextromethorphan; meperidine; codeine; etc.)  Legal status: CBD remains a Schedule I drug prohibited for any use. CBD is illegal with one exception. In the [place], CBD has a limited Food and Drug Administration (FDA) approval for the treatment of two specific types of epilepsy disorders. Only one CBD product has been approved by the FDA for this purpose: "Epidiolex". FDA is aware that some companies are marketing products containing cannabis and cannabis-derived compounds in ways that violate the Federal Food, Drug and Cosmetic Act (FD&C Act) and that may put the health and safety of consumers at risk. The FDA, a Federal agency, has not enforced the CBD status since 2018.   Legality: Some manufacturers ship CBD products nationally, which is illegal. Often such products are sold online and are therefore available throughout the country. CBD is openly sold in head shops and health food stores in some states where such sales have not been explicitly legalized. Selling unapproved products with unsubstantiated therapeutic claims is not only a violation of the law, but also can put patients at risk, as these products have not been proven to be safe or effective. Federal illegality makes it difficult to conduct research on CBD.  Reference: "FDA Regulation of Cannabis and Cannabis-Derived Products, Including Cannabidiol (CBD)" -  https://www.fda.gov/news-events/public-health-focus/fda-regulation-cannabis-and-cannabis-derived-products-including-cannabidiol-cbd  Warning: CBD is not FDA approved and has not undergo the same manufacturing controls as prescription drugs.  This means that the purity and safety of available CBD may be questionable. Most of the time, despite manufacturer's claims, it is contaminated with THC (delta-9-tetrahydrocannabinol - the chemical in marijuana responsible for the "HIGH").  When this is the case, the THC contaminant will trigger a positive urine drug screen (UDS) test for Marijuana (carboxy-THC). Because a positive UDS for any illicit substance is a violation of our medication agreement, your opioid analgesics (pain medicine) may be permanently discontinued.  MORE ABOUT CBD  General Information: CBD  is a derivative of the Marijuana (cannabis sativa) plant discovered in 1940. It is one of the 113 identified substances found in Marijuana. It accounts for up to 40% of the plant's extract. As of 2018, preliminary clinical studies on CBD included research for the treatment of anxiety, movement disorders, and pain. CBD is available and consumed in multiple forms, including inhalation of smoke or vapor, as an aerosol spray, and by mouth. It may be supplied as an oil containing CBD, capsules, dried cannabis, or as a liquid solution. CBD is thought not to be as psychoactive as THC (delta-9-tetrahydrocannabinol - the chemical in marijuana responsible for the "HIGH"). Studies suggest that CBD may interact with different biological target receptors in the body, including cannabinoid and other neurotransmitter receptors. As of 2018 the mechanism of action for its biological effects has not been determined.  Side-effects  Adverse reactions: Dry mouth, diarrhea, decreased appetite, fatigue, drowsiness, malaise, weakness, sleep disturbances, and others.  Drug interactions: CBC may interact with other medications  such as blood-thinners. (Last update: 08/16/2019) ____________________________________________________________________________________________  ____________________________________________________________________________________________  Drug Holidays (Slow)  What is a "Drug Holiday"? Drug Holiday: is the name given to the period of time during which a patient stops taking a medication(s) for the purpose of eliminating tolerance to the drug.  Benefits Improved effectiveness of opioids. Decreased opioid dose needed to achieve benefits. Improved pain with lesser dose.    What is tolerance? Tolerance: is the progressive decreased in effectiveness of a drug due to its repetitive use. With repetitive use, the body gets use to the medication and as a consequence, it loses its effectiveness. This is a common problem seen with opioid pain medications. As a result, a larger dose of the drug is needed to achieve the same effect that used to be obtained with a smaller dose.  How long should a "Drug Holiday" last? You should stay off of the pain medicine for at least 14 consecutive days. (2 weeks)  Should I stop the medicine "cold turkey"? No. You should always coordinate with your Pain Specialist so that he/she can provide you with the correct medication dose to make the transition as smoothly as possible.  How do I stop the medicine? Slowly. You will be instructed to decrease the daily amount of pills that you take by one (1) pill every seven (7) days. This is called a "slow downward taper" of your dose. For example: if you normally take four (4) pills per day, you will be asked to drop this dose to three (3) pills per day for seven (7) days, then to two (2) pills per day for seven (7) days, then to one (1) per day for seven (7) days, and at the end of those last seven (7) days, this is when the "Drug Holiday" would start.   Will I have withdrawals? By doing a "slow downward taper" like this one, it  is unlikely that you will experience any significant withdrawal symptoms. Typically, what triggers withdrawals is the sudden stop of a high dose opioid therapy. Withdrawals can usually be avoided by slowly decreasing the dose over a prolonged period of time. If you do not follow these instructions and decide to stop your medication abruptly, withdrawals may be possible.  What are withdrawals? Withdrawals: refers to the wide range of symptoms that occur after stopping or dramatically reducing opiate drugs after heavy and prolonged use. Withdrawal symptoms do not occur to patients that use low dose opioids, or those who take the medication sporadically. Contrary to benzodiazepine (example: Valium, Xanax, etc.) or alcohol withdrawals ("Delirium Tremens"), opioid withdrawals are not lethal. Withdrawals are the physical manifestation of the body getting rid of the excess receptors.  Expected Symptoms Early symptoms of withdrawal may include: Agitation Anxiety Muscle aches Increased tearing Insomnia Runny nose Sweating Yawning  Late symptoms of withdrawal may include: Abdominal cramping Diarrhea Dilated pupils Goose bumps Nausea Vomiting  Will I experience withdrawals? Due to the slow nature of the taper, it is very unlikely that you will experience any.  What is a slow taper? Taper: refers to the gradual decrease in dose.  (Last update: 07/30/2019) ____________________________________________________________________________________________    

## 2020-07-26 NOTE — Progress Notes (Signed)
Nursing Pain Medication Assessment:  Safety precautions to be maintained throughout the outpatient stay will include: orient to surroundings, keep bed in low position, maintain call bell within reach at all times, provide assistance with transfer out of bed and ambulation.  Medication Inspection Compliance:  Pt brought in 12 unknown pill in an unmarked bottle  Medication: Tramadol (Ultram) Pill/Patch Count:  12 Pill/Patch Appearance:  pill has the number 58 on them Bottle Appearance: No label. Patient informed that medications must be transported in properly and accurately labeled containers. Filled Date: 0 / 0 /  22 Last Medication intake:  Today  Pills was searched under pill identifier and Tramadol 50mg  was identified.   Safety precautions to be maintained throughout the outpatient stay will include: orient to surroundings, keep bed in low position, maintain call bell within reach at all times, provide assistance with transfer out of bed and ambulation.

## 2020-07-26 NOTE — Progress Notes (Deleted)
Nursing Pain Medication Assessment:  Safety precautions to be maintained throughout the outpatient stay will include: orient to surroundings, keep bed in low position, maintain call bell within reach at all times, provide assistance with transfer out of bed and ambulation.  Medication Inspection Compliance: {Blank single:19197::"[name] did not comply with our request to bring her pills to be counted. She was reminded that bringing the medication bottles, even when empty, is a requirement.","Pill count conducted under aseptic conditions, in front of the patient. Neither the pills nor the bottle was removed from the patient's sight at any time. Once count was completed pills were immediately returned to the patient in their original bottle."}  Medication: {Blank single:19197::"Multiple medications","Buprenorphine (Suboxone)","Butorphanol (Stadol)","Duragesic patch","Fentanyl patch","Hydrocodone/APAP","Hydromorphone (Dilaudid)","Methadone","Morphine IR","Morphine ER (MSContin)","Oxycodone IR","Oxycodone/APAP","Oxycodone ER (OxyContin)","Oxymorphone (Opana)","Tapentadol (Nucynta)","Tramadol (Ultram)","See above"} Pill/Patch Count: {Blank single:19197::"No pills available to be counted.","*** of *** patches remain","*** of *** pills remain"} Pill/Patch Appearance: {Blank single:19197::"No markings","Markings inconsistent with prescribed medication","Markings consistent with prescribed medication"} Bottle Appearance: {Blank single:19197::"No label. Patient informed that medications must be transported in properly and accurately labeled containers.","Non-pharmacy container. Patient reminded that prescription medications must be kept in original, labeled, pharmacy bottle.","Old prescription bottle. Patient reminded that medications should always be kept in the newest prescription bottle.","No container available. Did not bring bottle(s) to appointment.","Standard pharmacy container. Clearly  labeled."} Filled Date: *** / *** / {Blank single:19197::"2024","2023","2022"} Last Medication intake:  {Blank single:19197::"Ran out of medicine more than 48 hours ago","Day before yesterday","Yesterday","Today"}

## 2020-07-29 ENCOUNTER — Other Ambulatory Visit: Payer: Self-pay

## 2020-07-29 ENCOUNTER — Encounter: Payer: Self-pay | Admitting: Physician Assistant

## 2020-07-29 ENCOUNTER — Ambulatory Visit (INDEPENDENT_AMBULATORY_CARE_PROVIDER_SITE_OTHER): Payer: Medicare Other | Admitting: Physician Assistant

## 2020-07-29 VITALS — BP 113/71 | HR 89 | Temp 98.7°F | Ht 64.5 in | Wt 187.5 lb

## 2020-07-29 DIAGNOSIS — C8510 Unspecified B-cell lymphoma, unspecified site: Secondary | ICD-10-CM

## 2020-07-29 DIAGNOSIS — K5904 Chronic idiopathic constipation: Secondary | ICD-10-CM

## 2020-07-29 DIAGNOSIS — I10 Essential (primary) hypertension: Secondary | ICD-10-CM

## 2020-07-29 DIAGNOSIS — J302 Other seasonal allergic rhinitis: Secondary | ICD-10-CM

## 2020-07-29 DIAGNOSIS — F39 Unspecified mood [affective] disorder: Secondary | ICD-10-CM

## 2020-07-29 DIAGNOSIS — E785 Hyperlipidemia, unspecified: Secondary | ICD-10-CM | POA: Diagnosis not present

## 2020-07-29 LAB — TOXASSURE SELECT 13 (MW), URINE

## 2020-07-29 NOTE — Patient Instructions (Signed)
How to Take Your Blood Pressure ?Blood pressure measures how strongly your blood is pressing against the walls of your arteries. Arteries are blood vessels that carry blood from your heart throughout your body. You can take your blood pressure at home with a machine. ?You may need to check your blood pressure at home: ?To check if you have high blood pressure (hypertension). ?To check your blood pressure over time. ?To make sure your blood pressure medicine is working. ?Supplies needed: ?Blood pressure machine, or monitor. ?Dining room chair to sit in. ?Table or desk. ?Small notebook. ?Pencil or pen. ?How to prepare ?Avoid these things for 30 minutes before checking your blood pressure: ?Having drinks with caffeine in them, such as coffee or tea. ?Drinking alcohol. ?Eating. ?Smoking. ?Exercising. ?Do these things five minutes before checking your blood pressure: ?Go to the bathroom and pee (urinate). ?Sit in a dining chair. Do not sit on a soft couch or an armchair. ?Be quiet. Do not talk. ?How to take your blood pressure ?Follow the instructions that came with your machine. If you have a digital blood pressure monitor, these may be the instructions: ?Sit up straight. ?Place your feet on the floor. Do not cross your ankles or legs. ?Rest your left arm at the level of your heart. You may rest it on a table, desk, or chair. ?Pull up your shirt sleeve. ?Wrap the blood pressure cuff around the upper part of your left arm. The cuff should be 1 inch (2.5 cm) above your elbow. It is best to wrap the cuff around bare skin. ?Fit the cuff snugly around your arm. You should be able to place only one finger between the cuff and your arm. ?Place the cord so that it rests in the bend of your elbow. ?Press the power button. ?Sit quietly while the cuff fills with air and loses air. ?Write down the numbers on the screen. ?Wait 2-3 minutes and then repeat steps 1-10. ?What do the numbers mean? ?Two numbers make up your blood  pressure. The first number is called systolic pressure. The second is called diastolic pressure. An example of a blood pressure reading is "120 over 80" (or 120/80). ?If you are an adult and do not have a medical condition, use this guide to find out if your blood pressure is normal: ?Normal ?First number: below 120. ?Second number: below 80. ?Elevated ?First number: 120-129. ?Second number: below 80. ?Hypertension stage 1 ?First number: 130-139. ?Second number: 80-89. ?Hypertension stage 2 ?First number: 140 or above. ?Second number: 90 or above. ?Your blood pressure is above normal even if only the first or only the second number is above normal. ?Follow these instructions at home: ?Medicines ?Take over-the-counter and prescription medicines only as told by your doctor. ?Tell your doctor if your medicine is causing side effects. ?General instructions ?Check your blood pressure as often as your doctor tells you to. ?Check your blood pressure at the same time every day. ?Take your monitor to your next doctor's appointment. Your doctor will: ?Make sure you are using it correctly. ?Make sure it is working right. ?Understand what your blood pressure numbers should be. ?Keep all follow-up visits as told by your doctor. This is important. ?General tips ?You will need a blood pressure machine, or monitor. Your doctor can suggest a monitor. You can buy one at a drugstore or online. When choosing one: ?Choose one with an arm cuff. ?Choose one that wraps around your upper arm. Only one finger should fit between   your arm and the cuff. ?Do not choose one that measures your blood pressure from your wrist or finger. ?Where to find more information ?American Heart Association: www.heart.org ?Contact a doctor if: ?Your blood pressure keeps being high. ?Your blood pressure is suddenly low. ?Get help right away if: ?Your first blood pressure number is higher than 180. ?Your second blood pressure number is higher than  120. ?Summary ?Check your blood pressure at the same time every day. ?Avoid caffeine, alcohol, smoking, and exercise for 30 minutes before checking your blood pressure. ?Make sure you understand what your blood pressure numbers should be. ?This information is not intended to replace advice given to you by your health care provider. Make sure you discuss any questions you have with your health care provider. ?Document Revised: 11/05/2019 Document Reviewed: 12/20/2018 ?Elsevier Patient Education ? 2022 Elsevier Inc. ? ?

## 2020-07-29 NOTE — Assessment & Plan Note (Signed)
-  Last direct LDL 122. -Discussed following a low fat diet. -Will repeat lipid panel at follow up visit. -Will continue to monitor.

## 2020-07-29 NOTE — Assessment & Plan Note (Signed)
-  Improved. -Continue current medication regimen. -Continue to stay well hydrated. -Will continue to monitor.

## 2020-07-29 NOTE — Assessment & Plan Note (Signed)
-  Followed by hematology/oncology.

## 2020-07-29 NOTE — Assessment & Plan Note (Signed)
-  Controlled. Continue current medication regimen. Will continue to monitor. 

## 2020-07-29 NOTE — Assessment & Plan Note (Signed)
>>  ASSESSMENT AND PLAN FOR MOOD DISORDER WRITTEN ON 07/29/2020 11:02 AM BY ABONZA, MARITZA, PA-C  -Followed by Vesta Mixer. -PHQ-9 score of 13, GAD-7 score of 16 -Patient on hydroxyzine 50 mg, prazosin 2 mg, sertraline 100 mg, paliperidone 6 mg and trazodone 25-50 mg

## 2020-07-29 NOTE — Progress Notes (Signed)
Established Patient Office Visit  Subjective:  Patient ID: Rachael West, female    DOB: 09-28-1955  Age: 66 y.o. MRN: [id number]  CC:  Chief Complaint  Patient presents with   Hypertension   Hyperlipidemia   Constipation    HPI Rachael West presents for follow up on hypertension, hyperlipidemia and constipation.  HTN: Pt denies chest pain, palpitations, dizziness or lower extremity swelling. Taking medication as directed without side effects. Checks BP at home and readings range <120/80s. Pt follows a low salt diet. Continues with good hydration.  HLD: Pt is not medication therapy. Reports limits fried foods and only has fried fish every so often, likes to bake.  Constipation: Has improved. States was not taking her wheat powder daily and she has been which has helped, has a bowel movement at least once daily. Does not feel as bloated.  Mood: Followed by Beverly Sessions. Reports her Fonnie Birkenhead was increased from 3 to 6 mg which has helped improve her mood. States has a telehealth appointment later this morning. Denies SI/HI.  Allergies: Taking her Claritin and montelukast which have helped keep her allergies controlled. Reports if she misses a dose of one of her meds she will start having sneezing.   Past Medical History:  Diagnosis Date   Anemia    Anxiety    Arthritis    Asthma    Depression    GERD (gastroesophageal reflux disease)    Hepatitis-C    History of chemotherapy    Hypertension    Insomnia    Lymphoma ([place]) 12/2013   aggressive lymphoma   Personal history of non-Hodgkin lymphomas    Vitamin D deficiency     Past Surgical History:  Procedure Laterality Date   ABDOMINAL HYSTERECTOMY     APPENDECTOMY     BREAST BIOPSY Left 2015   lymphoma   COLONOSCOPY  01/17/2011   Procedure: COLONOSCOPY;  Surgeon: Winfield Cunas., MD;  Location: [facility];  Service: Endoscopy;  Laterality: N/A;   ESOPHAGOGASTRODUODENOSCOPY  01/17/2011   Procedure:  ESOPHAGOGASTRODUODENOSCOPY (EGD);  Surgeon: Winfield Cunas., MD;  Location: [facility];  Service: Endoscopy;  Laterality: N/A;   TRIGGER FINGER RELEASE      Family History  Problem Relation Age of Onset   Asthma Mother    Allergies Mother    Asthma Sister    Heart disease Father    Heart disease Brother    Diabetes Maternal Grandmother    Diabetes Maternal Grandfather    Heart disease Paternal Grandmother    Heart disease Paternal Grandfather    Colon cancer Paternal Aunt    Liver cancer Paternal Uncle    Breast cancer Cousin    Ovarian cancer Other        great aunt (m)   Pancreatic cancer Other        great aunt (m)   Stomach cancer Neg Hx    Rectal cancer Neg Hx    Esophageal cancer Neg Hx     Social History   Socioeconomic History   Marital status: Divorced    Spouse name: Not on file   Number of children: Not on file   Years of education: Not on file   Highest education level: Not on file  Occupational History   Occupation: Disabled  Tobacco Use   Smoking status: Former    Packs/day: 1.50    Years: 20.00    Pack years: 30.00    Types: Cigarettes    Quit date:  01/09/1990    Years since quitting: 30.5   Smokeless tobacco: Never  Vaping Use   Vaping Use: Never used  Substance and Sexual Activity   Alcohol use: No    Alcohol/week: 0.0 standard drinks   Drug use: No   Sexual activity: Not Currently  Other Topics Concern   Not on file  Social History Narrative   Not on file   Social Determinants of Health   Financial Resource Strain: Not on file  Food Insecurity: Not on file  Transportation Needs: Not on file  Physical Activity: Not on file  Stress: Not on file  Social Connections: Not on file  Intimate Partner Violence: Not on file    Outpatient Medications Prior to Visit  Medication Sig Dispense Refill   albuterol (VENTOLIN HFA) 108 (90 Base) MCG/ACT inhaler INHALE 2 PUFFS BY MOUTH INTO THE LUNGS 4 TIMES DAILY. 8.5 each 0   atenolol  (TENORMIN) 25 MG tablet TAKE 1 TABLET BY MOUTH EVERY DAY 90 tablet 0   b complex vitamins tablet Take 1 tablet by mouth daily.     busPIRone (BUSPAR) 5 MG tablet Take 1 tablet (5 mg total) by mouth 2 (two) times daily.     dimenhyDRINATE (DRAMAMINE) 50 MG tablet Take 50 mg by mouth every 8 (eight) hours as needed.     Fluticasone-Salmeterol (ADVAIR DISKUS) 250-50 MCG/DOSE AEPB Inhale 1 puff into the lungs 2 (two) times daily. 1 each 2   gabapentin (NEURONTIN) 100 MG capsule Take 2 capsules (200 mg total) by mouth at bedtime. Follow written titration schedule. 60 capsule 5   hydrOXYzine (ATARAX/VISTARIL) 50 MG tablet Take 50 mg by mouth at bedtime.     hydrOXYzine (VISTARIL) 50 MG capsule Take 50 mg by mouth at bedtime.     ipratropium-albuterol (DUONEB) 0.5-2.5 (3) MG/3ML SOLN INHALE 3 MLS VIA NEBULIZER INTO THE LUNGS. SEE ADMIN INSTRUCTIONS 360 mL 0   latanoprost (XALATAN) 0.005 % ophthalmic solution Place 1 drop into both eyes daily.     lisinopril (ZESTRIL) 5 MG tablet Take 1 tablet (5 mg total) by mouth daily. 90 tablet 0   loratadine (CLARITIN) 10 MG tablet Take 1 tablet by mouth daily.     loratadine (CLARITIN) 10 MG tablet Take 1 tablet (10 mg total) by mouth daily. 90 tablet 0   Magnesium 500 MG CAPS Take 1 capsule (500 mg total) by mouth 2 (two) times daily at 8 am and 10 pm. 180 capsule 0   mirabegron ER (MYRBETRIQ) 25 MG TB24 tablet Take 1 tablet (25 mg total) by mouth daily. Needs to come from the Urologist- no further RFs will be provided 30 tablet 0   montelukast (SINGULAIR) 10 MG tablet Take 1 tablet (10 mg total) by mouth daily. 90 tablet 2   omeprazole (PRILOSEC) 40 MG capsule 1 po qd Needs a follow up for additional refills 10-02-2019 90 capsule 0   ondansetron (ZOFRAN) 4 MG tablet TAKE 1 TABLET BY MOUTH EVERY 8 HOURS AS NEEDED FOR NAUSEA AND VOMITING 45 tablet 3   paliperidone (INVEGA) 6 MG 24 hr tablet Take 6 mg by mouth every morning.     polyethylene glycol (MIRALAX /  GLYCOLAX) packet Take 17 g by mouth 2 (two) times daily.     prazosin (MINIPRESS) 1 MG capsule Take 2 mg by mouth at bedtime.     sertraline (ZOLOFT) 100 MG tablet Take 200 mg by mouth every morning.     traMADol (ULTRAM) 50 MG tablet Take 1-2  tablets (50-100 mg total) by mouth every 6 (six) hours as needed for severe pain. Must last 30 days 170 tablet 2   [START ON 10/04/2020] traMADol (ULTRAM) 50 MG tablet Take 1-2 tablets (50-100 mg total) by mouth every 6 (six) hours as needed for severe pain. Must last 30 days 170 tablet 2   traZODone (DESYREL) 50 MG tablet 1 TABLET BY MOUTH AT BEDTIME  5   lubiprostone (AMITIZA) 24 MCG capsule Take 1 capsule (24 mcg total) by mouth 2 (two) times daily with a meal. Swallow the medication whole. Do not break or chew the medication. 60 capsule 2   meloxicam (MOBIC) 15 MG tablet Take 1 tablet (15 mg total) by mouth daily. 30 tablet 2   Wheat Dextrin (BENEFIBER) POWD Take 6 g by mouth 3 (three) times daily before meals. (2 tsp = 6 g) 730 g 2   No facility-administered medications prior to visit.    Allergies  Allergen Reactions   Latex Hives   Penicillins Hives   Codeine Anxiety   Cortisone Swelling   Sulfa Antibiotics     ROS Review of Systems Review of Systems:  A fourteen system review of systems was performed and found to be positive as per HPI.   Objective:    Physical Exam General:  Well Developed, well nourished, appropriate for stated age.  Neuro:  Alert and oriented,  extra-ocular muscles intact  HEENT:  Normocephalic, atraumatic, neck supple Skin:  no gross rash, warm, pink. Cardiac:  RRR Respiratory:  ECTA B/L and A/P, Not using accessory muscles, speaking in full sentences- unlabored. Vascular:  Ext warm, no cyanosis apprec.; cap RF less 2 sec. Psych:  No HI/SI, judgement and insight good, Euthymic mood. Full Affect.  BP 113/71   Pulse 89   Temp 98.7 F (37.1 C)   Ht 5' 4.5" (1.638 m)   Wt 187 lb 8 oz (85 kg)   SpO2 96%   BMI  31.69 kg/m  Wt Readings from Last 3 Encounters:  07/29/20 187 lb 8 oz (85 kg)  07/26/20 185 lb (83.9 kg)  05/11/20 169 lb (76.7 kg)     Health Maintenance Due  Topic Date Due   COVID-19 Vaccine (1) Never done   TETANUS/TDAP  Never done   Zoster Vaccines- Shingrix (1 of 2) Never done   DEXA SCAN  Never done   PNA vac Low Risk Adult (1 of 2 - PCV13) 08/11/2018    There are no preventive care reminders to display for this patient.  Lab Results  Component Value Date   TSH 0.770 03/25/2020   Lab Results  Component Value Date   WBC 4.5 03/25/2020   HGB 12.5 03/25/2020   HCT 38.7 03/25/2020   MCV 81 03/25/2020   PLT 250 03/25/2020   Lab Results  Component Value Date   NA 143 03/25/2020   K 5.2 03/25/2020   CHLORIDE 106 08/03/2016   CO2 25 03/25/2020   GLUCOSE 84 03/25/2020   BUN 7 (L) 03/25/2020   CREATININE 0.77 03/25/2020   BILITOT 0.4 03/25/2020   ALKPHOS 96 03/25/2020   AST 14 03/25/2020   ALT 11 03/25/2020   PROT 7.7 03/25/2020   ALBUMIN 4.1 03/25/2020   CALCIUM 9.5 03/25/2020   ANIONGAP 9 08/05/2019   EGFR 85 03/25/2020   Lab Results  Component Value Date   CHOL 187 05/29/2018   Lab Results  Component Value Date   HDL 37 (L) 05/29/2018   Lab Results  Component  Value Date   LDLCALC 125 (H) 05/29/2018   Lab Results  Component Value Date   TRIG 126 05/29/2018   Lab Results  Component Value Date   CHOLHDL 5.1 (H) 05/29/2018   Lab Results  Component Value Date   HGBA1C 5.3 03/25/2020      Assessment & Plan:   Problem List Items Addressed This Visit       Cardiovascular and Mediastinum   Essential hypertension - Primary    -Controlled. -Continue current medication regimen. -Will continue to monitor.         Digestive   Chronic idiopathic constipation (Chronic)    -Improved. -Continue current medication regimen. -Continue to stay well hydrated. -Will continue to monitor.         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       Hyperlipidemia    -Last direct LDL 122. -Discussed following a low fat diet. -Will repeat lipid panel at follow up visit. -Will continue to monitor.       Mood disorder ([place])    -Followed by Beverly Sessions. -PHQ-9 score of 13, GAD-7 score of 16 -Patient on hydroxyzine 50 mg, prazosin 2 mg, sertraline 100 mg, paliperidone 6 mg and trazodone 25-50 mg          Other Visit Diagnoses     Seasonal allergies          Seasonal allergies: -Controlled. -Continue current medication regimen. -Will continue to monitor.  No orders of the defined types were placed in this encounter.   Follow-up: Return in about 4 months (around 11/29/2020) for HTN, HLD, Mood and FBW ).    Lorrene Reid, PA-C

## 2020-07-29 NOTE — Assessment & Plan Note (Signed)
-  Followed by Beverly Sessions. -PHQ-9 score of 13, GAD-7 score of 16 -Patient on hydroxyzine 50 mg, prazosin 2 mg, sertraline 100 mg, paliperidone 6 mg and trazodone 25-50 mg

## 2020-08-05 ENCOUNTER — Other Ambulatory Visit: Payer: Medicare Other

## 2020-08-05 ENCOUNTER — Ambulatory Visit: Payer: Medicare Other | Admitting: Hematology and Oncology

## 2020-08-11 ENCOUNTER — Other Ambulatory Visit: Payer: Self-pay

## 2020-08-11 DIAGNOSIS — C8510 Unspecified B-cell lymphoma, unspecified site: Secondary | ICD-10-CM

## 2020-08-11 NOTE — Progress Notes (Signed)
Patient Care Team: Lorrene Reid, PA-C as PCP - General (Physician Assistant) Laurence Spates, MD (Inactive) as Consulting Physician (Gastroenterology) Loletha Carrow Kirke Corin, MD as Consulting Physician (Gastroenterology) Elsie Saas, MD as Consulting Physician (Orthopedic Surgery) Meade Maw, MD as Consulting Physician (Neurosurgery) Milinda Pointer, MD as Consulting Physician (Pain Medicine)  DIAGNOSIS:    ICD-10-CM   1. 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 PET activity in the bone marrow and spleen thought to be related to chemotherapy rather than disease. (Stage 2B)    05/29/2014 PET scan   Significant improvement, with reduction in size and activity of the left supraclavicular and left axillary lymph node ; reduced activity in the normal size spleen; and resolution of the diffuse hypermetabolic marrow activity.    02/02/2015 Imaging   Borderline left axillary lymph node same size, no abdominal or pelvic lymphadenopathy, hepatomegaly mild cirrhosis , improving right middle lobe airspace disease. No evidence of lymphoma    07/31/2016 Imaging   CT CAP; no findings of lymphoma, index left axillary lymph node 9 mm within normal limits      CHIEF COMPLIANT: Follow-up of diffuse large B-cell lymphoma  INTERVAL HISTORY: Rachael West is a 65 y.o. with above-mentioned history of diffuse large B-cell lymphoma who is in  complete remission. Mammogram on 06/24/20 showed no evidence of malignancy bilaterally. She presents to the clinic today for annual follow-up.  She denies any fevers chills night sweats weight loss or any other symptoms raising concerns for lymphoma recurrence.  She had COVID but did not have any symptoms.  ALLERGIES:  is allergic to latex, penicillins, codeine, cortisone, and sulfa antibiotics.  MEDICATIONS:  Current Outpatient Medications  Medication Sig Dispense Refill   albuterol (VENTOLIN HFA) 108 (90 Base) MCG/ACT inhaler INHALE 2 PUFFS BY MOUTH INTO THE LUNGS 4 TIMES DAILY. 8.5 each 0   atenolol (TENORMIN) 25 MG tablet TAKE 1 TABLET BY MOUTH EVERY DAY 90 tablet 0   b complex vitamins tablet Take 1 tablet by mouth daily.     busPIRone (BUSPAR) 5 MG tablet Take 1 tablet (5 mg total) by mouth 2 (two) times daily.     dimenhyDRINATE (DRAMAMINE) 50 MG tablet Take 50 mg by mouth every 8 (eight) hours as needed.     Fluticasone-Salmeterol (ADVAIR DISKUS) 250-50 MCG/DOSE AEPB Inhale 1 puff into the lungs 2 (two) times daily. 1 each 2   gabapentin (NEURONTIN) 100 MG capsule Take 2 capsules (200 mg total) by mouth at bedtime. Follow written titration schedule. 60 capsule 5   hydrOXYzine (ATARAX/VISTARIL) 50 MG tablet Take 50 mg by mouth at bedtime.     hydrOXYzine (VISTARIL) 50 MG capsule Take 50 mg by mouth at bedtime.     ipratropium-albuterol (DUONEB) 0.5-2.5 (3) MG/3ML SOLN INHALE 3 MLS VIA NEBULIZER INTO THE LUNGS. SEE ADMIN INSTRUCTIONS 360 mL 0   latanoprost (XALATAN) 0.005 % ophthalmic solution Place 1 drop into both eyes daily.  lisinopril (ZESTRIL) 5 MG tablet Take 1 tablet (5 mg total) by mouth daily. 90 tablet 0   loratadine (CLARITIN) 10 MG tablet Take 1 tablet by mouth daily.     loratadine (CLARITIN) 10 MG tablet Take 1 tablet (10 mg total) by mouth daily. 90 tablet 0   lubiprostone (AMITIZA) 24 MCG capsule Take 1 capsule (24 mcg total) by mouth 2 (two) times daily with a meal.  Swallow the medication whole. Do not break or chew the medication. 60 capsule 2   Magnesium 500 MG CAPS Take 1 capsule (500 mg total) by mouth 2 (two) times daily at 8 am and 10 pm. 180 capsule 0   meloxicam (MOBIC) 15 MG tablet Take 1 tablet (15 mg total) by mouth daily. 30 tablet 2   mirabegron ER (MYRBETRIQ) 25 MG TB24 tablet Take 1 tablet (25 mg total) by mouth daily. Needs to come from the Urologist- no further RFs will be provided 30 tablet 0   montelukast (SINGULAIR) 10 MG tablet Take 1 tablet (10 mg total) by mouth daily. 90 tablet 2   omeprazole (PRILOSEC) 40 MG capsule 1 po qd Needs a follow up for additional refills 10-02-2019 90 capsule 0   ondansetron (ZOFRAN) 4 MG tablet TAKE 1 TABLET BY MOUTH EVERY 8 HOURS AS NEEDED FOR NAUSEA AND VOMITING 45 tablet 3   paliperidone (INVEGA) 6 MG 24 hr tablet Take 6 mg by mouth every morning.     polyethylene glycol (MIRALAX / GLYCOLAX) packet Take 17 g by mouth 2 (two) times daily.     prazosin (MINIPRESS) 1 MG capsule Take 2 mg by mouth at bedtime.     sertraline (ZOLOFT) 100 MG tablet Take 200 mg by mouth every morning.     traMADol (ULTRAM) 50 MG tablet Take 1-2 tablets (50-100 mg total) by mouth every 6 (six) hours as needed for severe pain. Must last 30 days 170 tablet 2   [START ON 10/04/2020] traMADol (ULTRAM) 50 MG tablet Take 1-2 tablets (50-100 mg total) by mouth every 6 (six) hours as needed for severe pain. Must last 30 days 170 tablet 2   traZODone (DESYREL) 50 MG tablet 1 TABLET BY MOUTH AT BEDTIME  5   Wheat Dextrin (BENEFIBER) POWD Take 6 g by mouth 3 (three) times daily before meals. (2 tsp = 6 g) 730 g 2   No current facility-administered medications for this visit.    PHYSICAL EXAMINATION: ECOG PERFORMANCE STATUS: 1 - Symptomatic but completely ambulatory  Vitals:   08/12/20 1500  BP: (!) 141/77  Pulse: 79  Resp: 18  Temp: (!) 97.3 F (36.3 C)  SpO2: 100%   Filed Weights   08/12/20 1500  Weight: 188 lb 12.8 oz (85.6  kg)      LABORATORY DATA:  I have reviewed the data as listed CMP Latest Ref Rng & Units 03/25/2020 08/05/2019 08/05/2018  Glucose 65 - 99 mg/dL 84 99 92  BUN 8 - 27 mg/dL 7(L) 7(L) 14  Creatinine 0.57 - 1.00 mg/dL 8.87 1.55 0.95  Sodium 134 - 144 mmol/L 143 142 139  Potassium 3.5 - 5.2 mmol/L 5.2 4.1 4.1  Chloride 96 - 106 mmol/L 104 105 104  CO2 20 - 29 mmol/L 25 28 26   Calcium 8.7 - 10.3 mg/dL 9.5 9.8 9.4  Total Protein 6.0 - 8.5 g/dL 7.7 7.5 7.5  Total Bilirubin 0.0 - 1.2 mg/dL 0.4 0.3 0.3  Alkaline Phos 44 - 121 IU/L 96 80 72  AST 0 -  40 IU/L 14 14(L) 21  ALT 0 - 32 IU/L $Remov'11 12 15    'dvGUNN$ Lab Results  Component Value Date   WBC 6.2 08/12/2020   HGB 11.2 (L) 08/12/2020   HCT 35.7 (L) 08/12/2020   MCV 82.3 08/12/2020   PLT 195 08/12/2020   NEUTROABS 2.2 08/12/2020    ASSESSMENT & PLAN:  Large B-cell lymphoma ([facility]) Aggressive large B-cell lymphoma: CD5 and 10 negative, CD20 positive BCL 2 and BCL 6 positive: Differential diagnosis aggressive large B-cell lymphoma versus B-cell lymphoma unclassified, Ki-67 100%, bulky lymphadenopathy supraclavicular and axillary lymph nodes, bone marrow negative, stage IIB     Treatment Summary: R CHOP chemotherapy started 01/16/2014 completed 05/01/14   PET-CT 05/29/14: Near CR   CT CAP  07/31/2016: No evidence of lymphoma CT CAP: 08/05/19: No suspicious lymphadenopathy to suggest recurrent lymphoma. Spleen is normal in size. 13 mm ground-glass nodule with cystic lucencies in the left lower lobe, possibly minimally progressive   Will not perform any additional scans since she completed 5 years of surveillance CTs Since she is 6 years from diagnosis, I did not recommend any further investigations.  She can be seen by [place] on an as-needed basis.    No orders of the defined types were placed in this encounter.  The patient has a good understanding of the overall plan. she agrees with it. she will call with any problems that may develop before the next  visit here.  Total time spent: 20 mins including face to face time and time spent for planning, charting and coordination of care  Rulon Eisenmenger, MD, MPH 08/12/2020  I, Thana Ates, am acting as scribe for Dr. Nicholas Lose.  I have reviewed the above documentation for accuracy and completeness, and I agree with the above.

## 2020-08-12 ENCOUNTER — Inpatient Hospital Stay (HOSPITAL_BASED_OUTPATIENT_CLINIC_OR_DEPARTMENT_OTHER): Payer: Medicare Other | Admitting: Hematology and Oncology

## 2020-08-12 ENCOUNTER — Other Ambulatory Visit: Payer: Self-pay

## 2020-08-12 ENCOUNTER — Inpatient Hospital Stay: Payer: Medicare Other | Attending: Hematology and Oncology

## 2020-08-12 DIAGNOSIS — Z8572 Personal history of non-Hodgkin lymphomas: Secondary | ICD-10-CM | POA: Diagnosis present

## 2020-08-12 DIAGNOSIS — C8510 Unspecified B-cell lymphoma, unspecified site: Secondary | ICD-10-CM | POA: Diagnosis not present

## 2020-08-12 DIAGNOSIS — Z79899 Other long term (current) drug therapy: Secondary | ICD-10-CM | POA: Diagnosis not present

## 2020-08-12 DIAGNOSIS — Z7951 Long term (current) use of inhaled steroids: Secondary | ICD-10-CM | POA: Diagnosis not present

## 2020-08-12 LAB — CMP (CANCER CENTER ONLY)
ALT: 12 U/L (ref 0–44)
AST: 18 U/L (ref 15–41)
Albumin: 3.7 g/dL (ref 3.5–5.0)
Alkaline Phosphatase: 79 U/L (ref 38–126)
Anion gap: 9 (ref 5–15)
BUN: 10 mg/dL (ref 8–23)
CO2: 26 mmol/L (ref 22–32)
Calcium: 9.2 mg/dL (ref 8.9–10.3)
Chloride: 104 mmol/L (ref 98–111)
Creatinine: 0.85 mg/dL (ref 0.44–1.00)
GFR, Estimated: >60 mL/min (ref >60)
Glucose, Bld: 83 mg/dL (ref 70–99)
Potassium: 3.9 mmol/L (ref 3.5–5.1)
Sodium: 139 mmol/L (ref 135–145)
Total Bilirubin: 0.3 mg/dL (ref 0.3–1.2)
Total Protein: 7.3 g/dL (ref 6.5–8.1)

## 2020-08-12 LAB — CBC WITH DIFFERENTIAL (CANCER CENTER ONLY)
Abs Immature Granulocytes: 0.02 10*3/uL (ref 0.00–0.07)
Basophils Absolute: 0.1 10*3/uL (ref 0.0–0.1)
Basophils Relative: 1 %
Eosinophils Absolute: 0.2 10*3/uL (ref 0.0–0.5)
Eosinophils Relative: 2 %
HCT: 35.7 % — ABNORMAL LOW (ref 36.0–46.0)
Hemoglobin: 11.2 g/dL — ABNORMAL LOW (ref 12.0–15.0)
Immature Granulocytes: 0 %
Lymphocytes Relative: 50 %
Lymphs Abs: 3.1 10*3/uL (ref 0.7–4.0)
MCH: 25.8 pg — ABNORMAL LOW (ref 26.0–34.0)
MCHC: 31.4 g/dL (ref 30.0–36.0)
MCV: 82.3 fL (ref 80.0–100.0)
Monocytes Absolute: 0.7 10*3/uL (ref 0.1–1.0)
Monocytes Relative: 11 %
Neutro Abs: 2.2 10*3/uL (ref 1.7–7.7)
Neutrophils Relative %: 36 %
Platelet Count: 195 10*3/uL (ref 150–400)
RBC: 4.34 MIL/uL (ref 3.87–5.11)
RDW: 13.2 % (ref 11.5–15.5)
WBC Count: 6.2 10*3/uL (ref 4.0–10.5)
nRBC: 0 % (ref 0.0–0.2)

## 2020-08-12 LAB — LACTATE DEHYDROGENASE: LDH: 162 U/L (ref 98–192)

## 2020-08-12 NOTE — Assessment & Plan Note (Signed)
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 Spleen is normal in size. 13 mm ground-glass nodule with cystic lucencies in the left lower lobe, possibly minimally progressive  Will not perform any additional scans since she completed 5 years of surveillance CTs Return to clinic in 1 year for follow-up with labs

## 2020-08-17 ENCOUNTER — Other Ambulatory Visit: Payer: Self-pay | Admitting: Physician Assistant

## 2020-08-17 DIAGNOSIS — J4520 Mild intermittent asthma, uncomplicated: Secondary | ICD-10-CM

## 2020-09-23 ENCOUNTER — Ambulatory Visit
Admission: RE | Admit: 2020-09-23 | Discharge: 2020-09-23 | Disposition: A | Discharge status: Home / Self Care | Payer: Medicare Other | Source: Ambulatory Visit | Attending: Pain Medicine | Admitting: Pain Medicine

## 2020-09-23 ENCOUNTER — Ambulatory Visit (HOSPITAL_BASED_OUTPATIENT_CLINIC_OR_DEPARTMENT_OTHER): Payer: Medicare Other | Admitting: Pain Medicine

## 2020-09-23 ENCOUNTER — Other Ambulatory Visit: Payer: Self-pay

## 2020-09-23 ENCOUNTER — Encounter: Payer: Self-pay | Admitting: Pain Medicine

## 2020-09-23 VITALS — BP 124/88 | HR 72 | Temp 97.1°F | Resp 16 | Ht 64.5 in | Wt 188.0 lb

## 2020-09-23 DIAGNOSIS — M778 Other enthesopathies, not elsewhere classified: Secondary | ICD-10-CM | POA: Insufficient documentation

## 2020-09-23 DIAGNOSIS — M25511 Pain in right shoulder: Secondary | ICD-10-CM | POA: Diagnosis not present

## 2020-09-23 DIAGNOSIS — Z91040 Latex allergy status: Secondary | ICD-10-CM

## 2020-09-23 DIAGNOSIS — G8929 Other chronic pain: Secondary | ICD-10-CM

## 2020-09-23 DIAGNOSIS — M7581 Other shoulder lesions, right shoulder: Secondary | ICD-10-CM | POA: Insufficient documentation

## 2020-09-23 DIAGNOSIS — M65331 Trigger finger, right middle finger: Secondary | ICD-10-CM | POA: Insufficient documentation

## 2020-09-23 MED ORDER — LIDOCAINE HCL 2 % IJ SOLN
20.0000 mL | Freq: Once | INTRAMUSCULAR | Status: AC
  Administered 2020-09-23: 400 mg

## 2020-09-23 MED ORDER — ROPIVACAINE HCL 2 MG/ML IJ SOLN
9.0000 mL | Freq: Once | INTRAMUSCULAR | Status: AC
  Administered 2020-09-23: 9 mL via INTRA_ARTICULAR

## 2020-09-23 MED ORDER — ROPIVACAINE HCL 2 MG/ML IJ SOLN
INTRAMUSCULAR | Status: AC
  Filled 2020-09-23: qty 20

## 2020-09-23 MED ORDER — METHYLPREDNISOLONE ACETATE 80 MG/ML IJ SUSP
80.0000 mg | Freq: Once | INTRAMUSCULAR | Status: AC
  Administered 2020-09-23: 80 mg via INTRA_ARTICULAR
  Filled 2020-09-23: qty 1

## 2020-09-23 MED ORDER — LIDOCAINE HCL (PF) 2 % IJ SOLN
INTRAMUSCULAR | Status: AC
  Filled 2020-09-23: qty 5

## 2020-09-23 MED ORDER — LACTATED RINGERS IV SOLN
1000.0000 mL | Freq: Once | INTRAVENOUS | Status: AC
  Administered 2020-09-23: 1000 mL via INTRAVENOUS

## 2020-09-23 MED ORDER — MIDAZOLAM HCL 5 MG/5ML IJ SOLN
1.0000 mg | Freq: Once | INTRAMUSCULAR | Status: AC
  Administered 2020-09-23: 1.5 mg via INTRAVENOUS
  Filled 2020-09-23: qty 5

## 2020-09-23 NOTE — Progress Notes (Signed)
PROVIDER NOTE: Information contained herein reflects review and annotations entered in association with encounter. Interpretation of such information and data should be left to medically-trained personnel. Information provided to patient can be located elsewhere in the medical record under "Patient Instructions". Document created using STT-dictation technology, any transcriptional errors that may result from process are unintentional.    Patient: Rachael West  Service Category: Procedure  Provider: Gaspar Cola, MD  DOB: 1955/03/29  DOS: 09/23/2020  Location: [facility]  MRN: [id number]  Setting: Ambulatory - outpatient  Referring Provider: Lorrene Reid, PA-C  Type: Established Patient  Specialty: Interventional Pain Management  PCP: Lorrene Reid, PA-C   Primary Reason for Visit: Interventional Pain Management Treatment. CC: Shoulder Pain (Right )   Procedure:          Anesthesia, Analgesia, Anxiolysis:  Type: Palliative Glenohumeral and acromioclavicular joint Injection #3  Primary Purpose: Diagnostic Region: Superior Shoulder Area Level:  Shoulder Target Area: Glenohumeral and acromioclavicular joint Approach: Anterior approach. Laterality: Right  Type: Local Anesthesia Local Anesthetic: Lidocaine 1-2% Sedation: Minimal Anxiolysis  Indication(s): Anxiety & Analgesia Route: Infiltration ([place]/IM) IV Access: Available   Position: Supine   Indications: 1. Arthralgia of shoulder region (Right)   2. Chronic shoulder pain (Right)   3. Shoulder enthesopathy (Right)   4. Tendinitis of rotator cuff (Right)   5. Latex precautions, history of latex allergy    Pain Score: Pre-procedure: 7 /10 Post-procedure: 0-No pain/10    Pre-op H&P Assessment:  Rachael West is a 65 y.o. (year old), female patient, seen today for interventional treatment. She  has a past surgical history that includes Appendectomy; Trigger finger release;  Esophagogastroduodenoscopy (01/17/2011); Colonoscopy (01/17/2011); Abdominal hysterectomy; and Breast biopsy (Left, 2015). Rachael West has a current medication list which includes the following prescription(s): albuterol, atenolol, b complex vitamins, buspirone, dimenhydrinate, fluticasone-salmeterol, gabapentin, hydroxyzine, ipratropium-albuterol, latanoprost, lisinopril, loratadine, lubiprostone, magnesium, meloxicam, mirabegron er, montelukast, omeprazole, ondansetron, paliperidone, polyethylene glycol, prazosin, sertraline, [START ON 10/04/2020] tramadol, trazodone, benefiber, and hydroxyzine. Her primarily concern today is the Shoulder Pain (Right )  Initial Vital Signs:  Pulse/HCG Rate: 72  Temp: (!) 96.8 F (36 C) Resp: 16 BP: 127/86 SpO2: 99 %  BMI: Estimated body mass index is 31.77 kg/m as calculated from the following:   Height as of this encounter: 5' 4.5" (1.638 m).   Weight as of this encounter: 188 lb (85.3 kg).  Risk Assessment: Allergies: Reviewed. She is allergic to latex, penicillins, codeine, cortisone, and sulfa antibiotics.  Allergy Precautions: None required Coagulopathies: Reviewed. None identified.  Blood-thinner therapy: None at this time Active Infection(s): Reviewed. None identified. Rachael West is afebrile  Site Confirmation: Rachael West was asked to confirm the procedure and laterality before marking the site Procedure checklist: Completed Consent: Before the procedure and under the influence of no sedative(s), amnesic(s), or anxiolytics, the patient was informed of the treatment options, risks and possible complications. To fulfill our ethical and legal obligations, as recommended by the American Medical Association's Code of Ethics, I have informed the patient of my clinical impression; the nature and purpose of the treatment or procedure; the risks, benefits, and possible complications of the intervention; the alternatives, including doing  nothing; the risk(s) and benefit(s) of the alternative treatment(s) or procedure(s); and the risk(s) and benefit(s) of doing nothing. The patient was provided information about the general risks and possible complications associated with the procedure. These may include, but are not limited to: failure to achieve desired goals, infection, bleeding, organ or  nerve damage, allergic reactions, paralysis, and death. In addition, the patient was informed of those risks and complications associated to the procedure, such as failure to decrease pain; infection; bleeding; organ or nerve damage with subsequent damage to sensory, motor, and/or autonomic systems, resulting in permanent pain, numbness, and/or weakness of one or several areas of the body; allergic reactions; (i.e.: anaphylactic reaction); and/or death. Furthermore, the patient was informed of those risks and complications associated with the medications. These include, but are not limited to: allergic reactions (i.e.: anaphylactic or anaphylactoid reaction(s)); adrenal axis suppression; blood sugar elevation that in diabetics may result in ketoacidosis or comma; water retention that in patients with history of congestive heart failure may result in shortness of breath, pulmonary edema, and decompensation with resultant heart failure; weight gain; swelling or edema; medication-induced neural toxicity; particulate matter embolism and blood vessel occlusion with resultant organ, and/or nervous system infarction; and/or aseptic necrosis of one or more joints. Finally, the patient was informed that Medicine is not an exact science; therefore, there is also the possibility of unforeseen or unpredictable risks and/or possible complications that may result in a catastrophic outcome. The patient indicated having understood very clearly. We have given the patient no guarantees and we have made no promises. Enough time was given to the patient to ask questions, all of  which were answered to the patient's satisfaction. Rachael West has indicated that she wanted to continue with the procedure. Attestation: I, the ordering provider, attest that I have discussed with the patient the benefits, risks, side-effects, alternatives, likelihood of achieving goals, and potential problems during recovery for the procedure that I have provided informed consent. Date  Time: 09/23/2020  8:17 AM  Pre-Procedure Preparation:  Monitoring: As per clinic protocol. Respiration, ETCO2, SpO2, BP, heart rate and rhythm monitor placed and checked for adequate function Safety Precautions: Patient was assessed for positional comfort and pressure points before starting the procedure. Time-out: I initiated and conducted the "Time-out" before starting the procedure, as per protocol. The patient was asked to participate by confirming the accuracy of the "Time Out" information. Verification of the correct person, site, and procedure were performed and confirmed by me, the nursing staff, and the patient. "Time-out" conducted as per Joint Commission's Universal Protocol (UP.01.01.01). Time: 0946  Description of Procedure:          Area Prepped: Entire shoulder Area DuraPrep (Iodine Povacrylex [0.7% available iodine] and Isopropyl Alcohol, 74% w/w) Safety Precautions: Aspiration looking for blood return was conducted prior to all injections. At no point did we inject any substances, as a needle was being advanced. No attempts were made at seeking any paresthesias. Safe injection practices and needle disposal techniques used. Medications properly checked for expiration dates. SDV (single dose vial) medications used. Description of the Procedure: Protocol guidelines were followed. The patient was placed in position over the procedure table. The target area was identified and the area prepped in the usual manner. Skin & deeper tissues infiltrated with local anesthetic. Appropriate amount of time  allowed to pass for local anesthetics to take effect. The procedure needles were then advanced to the target area. Proper needle placement secured. Negative aspiration confirmed. Solution injected in intermittent fashion, asking for systemic symptoms every 0.5cc of injectate. The needles were then removed and the area cleansed, making sure to leave some of the prepping solution back to take advantage of its long term bactericidal properties.         Vitals:   09/23/20 0954 09/23/20 1003  09/23/20 1011 09/23/20 1019  BP: 131/72 125/79 (!) 118/33 124/88  Pulse: 70 71 71 72  Resp: '16 16 16 16  '$ Temp:  (!) 97 F (36.1 C)  (!) 97.1 F (36.2 C)  TempSrc:      SpO2: 99% 95% 99% 99%  Weight:      Height:        Start Time: 0946 hrs. End Time: 0952 hrs. Materials:  Needle(s) Type: Spinal Needle Gauge: 22G Length: 3.5-in Medication(s): Please see orders for medications and dosing details.  Imaging Guidance (Non-Spinal):          Type of Imaging Technique: Fluoroscopy Guidance (Non-Spinal) Indication(s): Assistance in needle guidance and placement for procedures requiring needle placement in or near specific anatomical locations not easily accessible without such assistance. Exposure Time: Please see nurses notes. Contrast: None used. Fluoroscopic Guidance: I was personally present during the use of fluoroscopy. "Tunnel Vision Technique" used to obtain the best possible view of the target area. Parallax error corrected before commencing the procedure. "Direction-depth-direction" technique used to introduce the needle under continuous pulsed fluoroscopy. Once target was reached, antero-posterior, oblique, and lateral fluoroscopic projection used confirm needle placement in all planes. Images permanently stored in EMR. Interpretation: No contrast injected. I personally interpreted the imaging intraoperatively. Adequate needle placement confirmed in multiple planes. Permanent images saved into the  patient's record.  Antibiotic Prophylaxis:   Anti-infectives (From admission, onward)    None      Indication(s): None identified  Post-operative Assessment:  Post-procedure Vital Signs:  Pulse/HCG Rate: 72  Temp: (!) 97.1 F (36.2 C) Resp: 16 BP: 124/88 SpO2: 99 %  EBL: None  Complications: No immediate post-treatment complications observed by team, or reported by patient.  Note: The patient tolerated the entire procedure well. A repeat set of vitals were taken after the procedure and the patient was kept under observation following institutional policy, for this type of procedure. Post-procedural neurological assessment was performed, showing return to baseline, prior to discharge. The patient was provided with post-procedure discharge instructions, including a section on how to identify potential problems. Should any problems arise concerning this procedure, the patient was given instructions to immediately contact us, at any time, without hesitation. In any case, we plan to contact the patient by telephone for a follow-up status report regarding this interventional procedure.  Comments:  No additional relevant information.  Plan of Care  Orders:  Orders Placed This Encounter  Procedures   SHOULDER INJECTION    Scheduling Instructions:     Procedure: Intra-articular shoulder (Glenohumeral) joint and (AC) Acromioclavicular joint injection     Side: Right-sided     Level: Glenohumeral joint and (AC) Acromioclavicular joint     Sedation: Patient's choice.     Timeframe: Today    Order Specific Question:   Where will this procedure be performed?    Answer:   [facility]   DG PA[facility] C-ARM 1-60 MIN NO REPORT    Intraoperative interpretation by procedural physician at [facility].    Standing Status:   Standing    Number of Occurrences:   1    Order Specific Question:   Reason for exam:    Answer:   Assistance in needle guidance and placement for  procedures requiring needle placement in or near specific anatomical locations not easily accessible without such assistance.   MR SHOULDER RIGHT WO CONTRAST    Standing Status:   Future    Standing Expiration Date:   10/23/2020  Scheduling Instructions:     Imaging must be done as soon as possible. Inform patient that order will expire within 30 days and I will not renew it.    Order Specific Question:   What is the patient's sedation requirement?    Answer:   No Sedation    Order Specific Question:   Does the patient have a pacemaker or implanted devices?    Answer:   No    Order Specific Question:   Preferred imaging location?    Answer:   [facility] (table limit-350lbs)    Order Specific Question:   Call Results- Best Contact Number?    Answer:   [phone] ([facility])    Order Specific Question:   Radiology Contrast Protocol - do NOT remove file path    Answer:   [email or web address]   Informed Consent Details: Physician/Practitioner Attestation; Transcribe to consent form and obtain patient signature    Note: Always confirm laterality of pain with Ms. Piedrahita, before procedure.    Order Specific Question:   Physician/Practitioner attestation of informed consent for procedure/surgical case    Answer:   I, the physician/practitioner, attest that I have discussed with the patient the benefits, risks, side effects, alternatives, likelihood of achieving goals and potential problems during recovery for the procedure that I have provided informed consent.    Order Specific Question:   Procedure    Answer:   Intra-articular shoulder joint injection under fluoroscopic guidance    Order Specific Question:   Physician/Practitioner performing the procedure    Answer:   Oneda Duffett A. Dossie Arbour, MD    Order Specific Question:   Indication/Reason    Answer:   Chronic shoulder pain secondary to shoulder arthropathy   Provide equipment / supplies at  bedside    "Block Tray" (Disposable  single use) Needle type: SpinalSpinal Amount/quantity: 1 Size: Regular (3.5-inch) Gauge: 22G    Standing Status:   Standing    Number of Occurrences:   1    Order Specific Question:   Specify    Answer:   Block Tray   Latex precautions    Activate Latex-Free Protocol.    Standing Status:   Standing    Number of Occurrences:   1    Chronic Opioid Analgesic:  Tramadol 50 mg, 2 tabs (100 mg) PO q 6 hrs (400 mg/day of tramadol) MME/day: 40 mg/day.   Medications ordered for procedure: Meds ordered this encounter  Medications   lidocaine (XYLOCAINE) 2 % (with pres) injection 400 mg   lactated ringers infusion 1,000 mL   midazolam (VERSED) 5 MG/5ML injection 1 mg    Make sure Flumazenil is available in the pyxis when using this medication. If oversedation occurs, administer 0.2 mg IV over 15 sec. If after 45 sec no response, administer 0.2 mg again over 1 min; may repeat at 1 min intervals; not to exceed 4 doses (1 mg)   ropivacaine (PF) 2 mg/mL (0.2%) (NAROPIN) injection 9 mL   methylPREDNISolone acetate (DEPO-MEDROL) injection 80 mg    Medications administered: We administered lidocaine, lactated ringers, midazolam, ropivacaine (PF) 2 mg/mL (0.2%), and methylPREDNISolone acetate.  See the medical record for exact dosing, route, and time of administration.  Follow-up plan:   Return in about 2 weeks (around 10/07/2020) for Proc-day (T,Th), (F2F), (PPE), for review of ordered tests.       Interventional Therapies  Risk  Complexity Considerations:   Estimated body mass index is 30.12 kg/m as calculated from the following:  Height as of this encounter: '5\' 5"'$  (1.651 m).   Weight as of this encounter: 181 lb (82.1 kg). NOTE: LATEX ALLERGY & Hx Hepatitis C (w/ cirrhosis)   Planned  Pending:      Under consideration:      Completed:   Diagnostic right glenohumeral/AC  inj. x2 (04/15/2020) (100/90/95) Diagnostic bilateral lumbar facet MBB x3  (06/03/2019) (100/100/90/90) Diagnostic bilateral cervical facet MBB x1 (11/06/2017) (100/100/90) Diagnostic left caudal ESI x1 (08/14/2019) (100/100/100 x8 days/90-100) Diagnostic right cervical ESI x2 (05/04/2020) (100/100/95/95)   Therapeutic  Palliative (PRN) options:   Therapeutic/palliative right glenohumeral/AC  inj. #3  Therapeutic/palliative bilateral lumbar facet MBB #4  Diagnostic bilateral cervical facet MBB #2  Therapeutic/palliative left caudal ESI #2  Therapeutic/palliative right cervical ESI #3     Recent Visits Date Type Provider Dept  09/23/20 Procedure visit Milinda Pointer, MD [facility]  07/26/20 Office Visit Milinda Pointer, MD [facility]  06/29/20 Telemedicine Milinda Pointer, MD [facility]  Showing recent visits within past 90 days and meeting all other requirements Future Appointments Date Type Provider Dept  12/09/20 Appointment Milinda Pointer, MD [facility]  Showing future appointments within next 90 days and meeting all other requirements Disposition: Discharge home  Discharge (Date  Time): 09/23/2020; 1019 hrs.   Primary Care Physician: Lorrene Reid, PA-C Location: [facility] Note by: Gaspar Cola, MD Date: 09/23/2020; Time: 10:18 AM  Disclaimer:  Medicine is not an Chief Strategy Officer. The only guarantee in medicine is that nothing is guaranteed. It is important to note that the decision to proceed with this intervention was based on the information collected from the patient. The Data and conclusions were drawn from the patient's questionnaire, the interview, and the physical examination. Because the information was provided in large part by the patient, it cannot be guaranteed that it has not been purposely or unconsciously manipulated. Every effort has been made to obtain as much relevant data as possible for this evaluation. It is important to note that the  conclusions that lead to this procedure are derived in large part from the available data. Always take into account that the treatment will also be dependent on availability of resources and existing treatment guidelines, considered by other Pain Management Practitioners as being common knowledge and practice, at the time of the intervention. For Medico-Legal purposes, it is also important to point out that variation in procedural techniques and pharmacological choices are the acceptable norm. The indications, contraindications, technique, and results of the above procedure should only be interpreted and judged by a Board-Certified Interventional Pain Specialist with extensive familiarity and expertise in the same exact procedure and technique.

## 2020-09-23 NOTE — Progress Notes (Signed)
Safety precautions to be maintained throughout the outpatient stay will include: orient to surroundings, keep bed in low position, maintain call bell within reach at all times, provide assistance with transfer out of bed and ambulation.  

## 2020-09-23 NOTE — Patient Instructions (Signed)

## 2020-09-24 ENCOUNTER — Telehealth: Payer: Self-pay

## 2020-09-24 NOTE — Telephone Encounter (Signed)
Post procedure phone call.  Patient states she is doing pretty good.   

## 2020-09-29 ENCOUNTER — Other Ambulatory Visit: Payer: Self-pay

## 2020-09-29 ENCOUNTER — Other Ambulatory Visit: Payer: Self-pay | Admitting: Hematology and Oncology

## 2020-09-29 ENCOUNTER — Other Ambulatory Visit: Payer: Self-pay | Admitting: Physician Assistant

## 2020-09-29 ENCOUNTER — Other Ambulatory Visit: Payer: Self-pay | Admitting: Pain Medicine

## 2020-09-29 DIAGNOSIS — J4520 Mild intermittent asthma, uncomplicated: Secondary | ICD-10-CM

## 2020-09-29 DIAGNOSIS — G8929 Other chronic pain: Secondary | ICD-10-CM

## 2020-09-29 DIAGNOSIS — Z79891 Long term (current) use of opiate analgesic: Secondary | ICD-10-CM

## 2020-09-29 DIAGNOSIS — T402X5A Adverse effect of other opioids, initial encounter: Secondary | ICD-10-CM

## 2020-09-29 DIAGNOSIS — K5903 Drug induced constipation: Secondary | ICD-10-CM

## 2020-09-29 DIAGNOSIS — I10 Essential (primary) hypertension: Secondary | ICD-10-CM

## 2020-09-29 DIAGNOSIS — Z79899 Other long term (current) drug therapy: Secondary | ICD-10-CM

## 2020-09-29 DIAGNOSIS — G894 Chronic pain syndrome: Secondary | ICD-10-CM

## 2020-09-29 DIAGNOSIS — M542 Cervicalgia: Secondary | ICD-10-CM

## 2020-09-29 DIAGNOSIS — M1991 Primary osteoarthritis, unspecified site: Secondary | ICD-10-CM

## 2020-09-29 DIAGNOSIS — M7918 Myalgia, other site: Secondary | ICD-10-CM

## 2020-09-29 DIAGNOSIS — E559 Vitamin D deficiency, unspecified: Secondary | ICD-10-CM

## 2020-09-29 DIAGNOSIS — N3941 Urge incontinence: Secondary | ICD-10-CM

## 2020-09-29 MED ORDER — ATENOLOL 25 MG PO TABS: ORAL_TABLET | ORAL | 0 refills | Status: DC

## 2020-09-29 MED ORDER — IPRATROPIUM-ALBUTEROL 0.5-2.5 (3) MG/3ML IN SOLN: RESPIRATORY_TRACT | 0 refills | Status: DC

## 2020-09-29 MED ORDER — ALBUTEROL SULFATE HFA 108 (90 BASE) MCG/ACT IN AERS: INHALATION_SPRAY | RESPIRATORY_TRACT | 0 refills | Status: DC

## 2020-09-29 MED ORDER — LUBIPROSTONE 24 MCG PO CAPS
24.0000 ug | ORAL_CAPSULE | Freq: Two times a day (BID) | ORAL | 2 refills | Status: DC
Start: 2020-09-29 — End: 2020-12-23

## 2020-09-29 MED ORDER — MELOXICAM 15 MG PO TABS
15.0000 mg | ORAL_TABLET | Freq: Every day | ORAL | 2 refills | Status: DC
Start: 2020-09-29 — End: 2020-12-20

## 2020-09-29 MED ORDER — MAGNESIUM 500 MG PO CAPS: 500.0000 mg | ORAL_CAPSULE | Freq: Two times a day (BID) | ORAL | 0 refills | Status: DC

## 2020-09-29 MED ORDER — LISINOPRIL 5 MG PO TABS: 5.0000 mg | ORAL_TABLET | Freq: Every day | ORAL | 0 refills | Status: DC

## 2020-09-30 ENCOUNTER — Ambulatory Visit: Admission: RE | Admit: 2020-09-30 | Payer: Medicare Other | Source: Ambulatory Visit

## 2020-09-30 MED ORDER — BUSPIRONE HCL 5 MG PO TABS: 5.0000 mg | ORAL_TABLET | Freq: Two times a day (BID) | ORAL | Status: DC

## 2020-10-16 ENCOUNTER — Other Ambulatory Visit: Payer: Self-pay | Admitting: Gastroenterology

## 2020-10-16 ENCOUNTER — Other Ambulatory Visit: Payer: Self-pay | Admitting: Physician Assistant

## 2020-10-16 DIAGNOSIS — J4520 Mild intermittent asthma, uncomplicated: Secondary | ICD-10-CM

## 2020-10-20 ENCOUNTER — Other Ambulatory Visit: Payer: Self-pay | Admitting: Physician Assistant

## 2020-10-20 DIAGNOSIS — J302 Other seasonal allergic rhinitis: Secondary | ICD-10-CM

## 2020-10-20 DIAGNOSIS — J4520 Mild intermittent asthma, uncomplicated: Secondary | ICD-10-CM

## 2020-11-04 ENCOUNTER — Other Ambulatory Visit: Payer: Self-pay | Admitting: Physician Assistant

## 2020-11-04 DIAGNOSIS — J4520 Mild intermittent asthma, uncomplicated: Secondary | ICD-10-CM

## 2020-11-30 ENCOUNTER — Other Ambulatory Visit: Payer: Self-pay | Admitting: Physician Assistant

## 2020-11-30 DIAGNOSIS — J4520 Mild intermittent asthma, uncomplicated: Secondary | ICD-10-CM

## 2020-12-09 ENCOUNTER — Other Ambulatory Visit: Payer: Self-pay | Admitting: Pain Medicine

## 2020-12-09 ENCOUNTER — Ambulatory Visit: Payer: Medicare Other | Attending: Pain Medicine | Admitting: Pain Medicine

## 2020-12-09 ENCOUNTER — Telehealth: Payer: Self-pay | Admitting: Pain Medicine

## 2020-12-09 ENCOUNTER — Encounter: Payer: Self-pay | Admitting: Pain Medicine

## 2020-12-09 ENCOUNTER — Other Ambulatory Visit: Payer: Self-pay

## 2020-12-09 VITALS — BP 115/76 | HR 79 | Temp 97.0°F | Resp 16 | Ht 64.5 in | Wt 188.0 lb

## 2020-12-09 DIAGNOSIS — M5442 Lumbago with sciatica, left side: Secondary | ICD-10-CM | POA: Insufficient documentation

## 2020-12-09 DIAGNOSIS — M7581 Other shoulder lesions, right shoulder: Secondary | ICD-10-CM | POA: Diagnosis not present

## 2020-12-09 DIAGNOSIS — Z79899 Other long term (current) drug therapy: Secondary | ICD-10-CM | POA: Diagnosis present

## 2020-12-09 DIAGNOSIS — Z79891 Long term (current) use of opiate analgesic: Secondary | ICD-10-CM | POA: Insufficient documentation

## 2020-12-09 DIAGNOSIS — M25511 Pain in right shoulder: Secondary | ICD-10-CM | POA: Diagnosis not present

## 2020-12-09 DIAGNOSIS — M778 Other enthesopathies, not elsewhere classified: Secondary | ICD-10-CM

## 2020-12-09 DIAGNOSIS — G8929 Other chronic pain: Secondary | ICD-10-CM | POA: Diagnosis present

## 2020-12-09 DIAGNOSIS — G894 Chronic pain syndrome: Secondary | ICD-10-CM | POA: Insufficient documentation

## 2020-12-09 DIAGNOSIS — M542 Cervicalgia: Secondary | ICD-10-CM | POA: Diagnosis present

## 2020-12-09 DIAGNOSIS — M549 Dorsalgia, unspecified: Secondary | ICD-10-CM | POA: Diagnosis present

## 2020-12-09 DIAGNOSIS — M79605 Pain in left leg: Secondary | ICD-10-CM | POA: Diagnosis present

## 2020-12-09 MED ORDER — TRAMADOL HCL 50 MG PO TABS: 50.0000 mg | ORAL_TABLET | Freq: Four times a day (QID) | ORAL | 5 refills | Status: DC | PRN

## 2020-12-09 NOTE — Telephone Encounter (Signed)
Sent to FN via bubble.

## 2020-12-09 NOTE — Progress Notes (Signed)
Nursing Pain Medication Assessment:  Safety precautions to be maintained throughout the outpatient stay will include: orient to surroundings, keep bed in low position, maintain call bell within reach at all times, provide assistance with transfer out of bed and ambulation.  Medication Inspection Compliance: Pill count conducted under aseptic conditions, in front of the patient. Neither the pills nor the bottle was removed from the patient's sight at any time. Once count was completed pills were immediately returned to the patient in their original bottle.  Medication: Tramadol (Ultram) Pill/Patch Count:  82 of 170 pills remain Pill/Patch Appearance: Markings consistent with prescribed medication Bottle Appearance: Standard pharmacy container. Clearly labeled. Filled Date: 44 / 21 / 2022 Last Medication intake:  Today

## 2020-12-09 NOTE — Progress Notes (Signed)
PROVIDER NOTE: Information contained herein reflects review and annotations entered in association with encounter. Interpretation of such information and data should be left to medically-trained personnel. Information provided to patient can be located elsewhere in the medical record under "Patient Instructions". Document created using STT-dictation technology, any transcriptional errors that may result from process are unintentional.    Patient: Rachael West  Service Category: E/M  Provider: Gaspar Cola, MD  DOB: [birth year]  DOS: 12/09/2020  Specialty: Interventional Pain Management  MRN: [id number]  Setting: Ambulatory outpatient  PCP: Lorrene Reid, PA-C  Type: Established Patient    Referring Provider: Lorrene Reid, PA-C  Location: Office  Delivery: Face-to-face     HPI  [name], a 65 y.o. year old female, is here today because of her Chronic right shoulder pain [M25.511, G89.29]. [name] primary complain today is No chief complaint on file. Last encounter: My last encounter with her was on 09/29/2020. Pertinent problems: [name] has Large B-cell lymphoma ([place]); Atypical chest pain; Chronic low back pain (Primary Area of Pain) (Bilateral) w/ sciatica (Left); Chronic lower extremity pain (2ry area of Pain) (Left); Chronic upper back pain (3ry area of Pain) (Bilateral) (L>R); Chronic neck pain (4th area of Pain) (Bilateral) (L>R); Occipital headache; Chronic knee pain (Bilateral) (R>L); Chronic pain syndrome; Chronic sacroiliac joint pain; DDD (degenerative disc disease), lumbar; Lumbar facet arthropathy; Lumbar facet syndrome (Bilateral) (L>R); Spondylosis without myelopathy or radiculopathy, lumbar region; DDD (degenerative disc disease), thoracic; Thoracolumbar dextroscoliosis; Cervicogenic headache; Tendinitis of rotator cuff (Left); Tendinitis of rotator cuff (Right); Fibromyalgia; Chronic shoulder pain (Bilateral) (R>L); Chronic hip pain  (Bilateral) (L>R); Cervicalgia; Chronic idiopathic gout; DDD (degenerative disc disease), cervical; 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); Cervical facet syndrome (Bilateral) (R>L); Spondylosis without myelopathy or radiculopathy, cervical region; Chronic musculoskeletal pain; Chronic low back pain (1ry area of Pain) (Bilateral) (L>R) w/o sciatica; Cervical disc disorder at C5-C6 level with radiculopathy (Right); Cervical radiculopathy at C6 (Right); Osteoarthrosis of multiple sites; Radicular pain of upper extremity (Right); Chronic upper extremity pain (Right); Chronic shoulder pain (Right); Arthralgia of shoulder region (Right); Shoulder enthesopathy (Right); Musculoskeletal disorder involving upper trapezius muscle (Right); Abnormal MRI, cervical spine (11/12/2019); Chronic neuropathic pain; Disturbance of skin sensation; Hyperalgesia; Trigger finger, middle finger (Right); and Trigger point of shoulder region (Right) on their pertinent problem list. Pain Assessment: Severity of Chronic pain is reported as a 3 /10. Location: Shoulder Right/radiates up to side of neck. Onset: More than a month ago. Quality: Aching, Shooting. Timing: Intermittent. Modifying factor(s): "I keep it still and dont move it". Vitals:  height is 5' 4.5" (1.638 m) and weight is 188 lb (85.3 kg). Her temperature is 97 F (36.1 C) (abnormal). Her blood pressure is 115/76 and her pulse is 79. Her respiration is 16 and oxygen saturation is 99%.   Reason for encounter: both, medication management and post-procedure assessment.   The patient indicates doing well with the current medication regimen. No adverse reactions or side effects reported to the medications.   Post-Procedure Evaluation  Procedure(s):    Procedure:           Anesthesia, Analgesia, Anxiolysis:  Type: Palliative Glenohumeral and acromioclavicular joint Injection #3  Primary Purpose: Diagnostic Region: Superior Shoulder Area Level:   Shoulder Target Area: Glenohumeral and acromioclavicular joint Approach: Anterior approach. Laterality: Right   Type: Local Anesthesia Local Anesthetic: Lidocaine 1-2% Sedation: Minimal Anxiolysis  Indication(s): Anxiety & Analgesia Route: Infiltration ([place]/IM) IV Access: Available     Position:  Supine    Indications: 1. Arthralgia of shoulder region (Right)   2. Chronic shoulder pain (Right)   3. Shoulder enthesopathy (Right)   4. Tendinitis of rotator cuff (Right)   5. Latex precautions, history of latex allergy     Pain Score: Pre-procedure: 7 /10 Post-procedure: 0-No pain/10     Anxiolysis: Please see nurses note.  Effectiveness during initial hour after procedure (Ultra-Short Term Relief): 100 %.  Local anesthetic used: Long-acting (4-6 hours) Effectiveness: Defined as any analgesic benefit obtained secondary to the administration of local anesthetics. This carries significant diagnostic value as to the etiological location, or anatomical origin, of the pain. Duration of benefit is expected to coincide with the duration of the local anesthetic used.  Effectiveness during initial 4-6 hours after procedure (Short-Term Relief): 100 %.  Long-term benefit: Defined as any relief past the pharmacologic duration of the local anesthetics.  Effectiveness past the initial 6 hours after procedure (Long-Term Relief): 80 %.  Benefits, current: Defined as benefit present at the time of this evaluation.   Analgesia: The patient indicates currently having an ongoing 80% relief of the shoulder pain. Function: [name] reports improvement in function ROM: [name] reports improvement in ROM  Pharmacotherapy Assessment  Analgesic: Tramadol 50 mg, 2 tabs (100 mg) PO q 6 hrs (400 mg/day of tramadol) MME/day: 40 mg/day.   Monitoring: [place] PMP: PDMP reviewed during this encounter.       Pharmacotherapy: No side-effects or adverse reactions reported. Compliance: No problems  identified. Effectiveness: Clinically acceptable.  Ignatius Specking, RN  12/09/2020  2:36 PM  Sign when Signing Visit Nursing Pain Medication Assessment:  Safety precautions to be maintained throughout the outpatient stay will include: orient to surroundings, keep bed in low position, maintain call bell within reach at all times, provide assistance with transfer out of bed and ambulation.  Medication Inspection Compliance: Pill count conducted under aseptic conditions, in front of the patient. Neither the pills nor the bottle was removed from the patient's sight at any time. Once count was completed pills were immediately returned to the patient in their original bottle.  Medication: Tramadol (Ultram) Pill/Patch Count:  82 of 170 pills remain Pill/Patch Appearance: Markings consistent with prescribed medication Bottle Appearance: Standard pharmacy container. Clearly labeled. Filled Date: 49 / 21 / 2022 Last Medication intake:  Today     UDS:  Summary  Date Value Ref Range Status  07/26/2020 Note  Final    Comment:    ==================================================================== [employer] Select 13 (MW) ==================================================================== Test                             Result       Flag       Units  Drug Present and Declared for Prescription Verification   Tramadol                       >6667        EXPECTED   ng/mg creat   O-Desmethyltramadol            >6667        EXPECTED   ng/mg creat   N-Desmethyltramadol            >6667        EXPECTED   ng/mg creat    Source of tramadol is a prescription medication. O-desmethyltramadol    and N-desmethyltramadol are expected metabolites of tramadol.  ====================================================================  Test                      Result    Flag   Units      Ref Range   Creatinine              75               mg/dL       >=20 ==================================================================== Declared Medications:  The flagging and interpretation on this report are based on the  following declared medications.  Unexpected results may arise from  inaccuracies in the declared medications.   **Note: The testing scope of this panel includes these medications:   Tramadol   **Note: The testing scope of this panel does not include the  following reported medications:   Albuterol  Atenolol  Buspirone  Dimenhydrinate  Fluticasone (Advair)  Gabapentin  Hydroxyzine  Ipratropium  Latanoprost  Lisinopril  Loratadine  Lubiprostone  Magnesium  Meloxicam  Mirabegron  Montelukast  Omeprazole  Ondansetron  Paliperidone  Polyethylene Glycol (MiraLAX)  Prazosin  Salmeterol (Advair)  Sertraline  Trazodone ==================================================================== For clinical consultation, please call [phone]. ====================================================================      ROS  Constitutional: Denies any fever or chills Gastrointestinal: No reported hemesis, hematochezia, vomiting, or acute GI distress Musculoskeletal: Denies any acute onset joint swelling, redness, loss of ROM, or weakness Neurological: No reported episodes of acute onset apraxia, aphasia, dysarthria, agnosia, amnesia, paralysis, loss of coordination, or loss of consciousness  Medication Review  Benefiber, Fluticasone-Salmeterol, Magnesium, albuterol, atenolol, b complex vitamins, busPIRone, dimenhyDRINATE, gabapentin, hydrOXYzine, ipratropium-albuterol, latanoprost, lisinopril, loratadine, lubiprostone, meloxicam, mirabegron ER, montelukast, omeprazole, ondansetron, paliperidone, polyethylene glycol, prazosin, sertraline, traMADol, and traZODone  History Review  Allergy: Rachael West is allergic to latex, penicillins, codeine, cortisone, and sulfa antibiotics. Drug: Rachael West  reports no  history of drug use. Alcohol:  reports no history of alcohol use. Tobacco:  reports that she quit smoking about 30 years ago. Her smoking use included cigarettes. She has a 30.00 pack-year smoking history. She has never used smokeless tobacco. Social: Rachael West  reports that she quit smoking about 30 years ago. Her smoking use included cigarettes. She has a 30.00 pack-year smoking history. She has never used smokeless tobacco. She reports that she does not drink alcohol and does not use drugs. Medical:  has a past medical history of Anemia, Anxiety, Arthritis, Asthma, Depression, GERD (gastroesophageal reflux disease), Hepatitis-C, History of chemotherapy, Hypertension, Insomnia, Lymphoma ([place]) (12/2013), Personal history of non-Hodgkin lymphomas, and Vitamin D deficiency. Surgical: Rachael West  has a past surgical history that includes Appendectomy; Trigger finger release; Esophagogastroduodenoscopy (01/17/2011); Colonoscopy (01/17/2011); Abdominal hysterectomy; and Breast biopsy (Left, 2015). Family: family history includes Allergies in her mother; Asthma in her mother and sister; Breast cancer in her cousin; Colon cancer in her paternal aunt; Diabetes in her maternal grandfather and maternal grandmother; Heart disease in her brother, father, paternal grandfather, and paternal grandmother; Liver cancer in her paternal uncle; Ovarian cancer in an other family member; Pancreatic cancer in an other family member.  Laboratory Chemistry Profile   Renal Lab Results  Component Value Date   BUN 10 08/12/2020   CREATININE 0.85 08/12/2020   BCR 9 (L) 03/25/2020   GFRAA >60 08/05/2019   GFRNONAA >60 08/12/2020    Hepatic Lab Results  Component Value Date   AST 18 08/12/2020   ALT 12 08/12/2020   ALBUMIN 3.7 08/12/2020   ALKPHOS 79 08/12/2020   HCVAB REACTIVE (A)  01/12/2014   LIPASE 34 10/31/2010    Electrolytes Lab Results  Component Value Date   NA 139 08/12/2020   K 3.9 08/12/2020    CL 104 08/12/2020   CALCIUM 9.2 08/12/2020   MG 2.2 08/29/2017    Bone Lab Results  Component Value Date   25OHVITD1 21 (L) 08/29/2017   25OHVITD2 <1.0 08/29/2017   25OHVITD3 21 08/29/2017    Inflammation (CRP: Acute Phase) (ESR: Chronic Phase) Lab Results  Component Value Date   CRP 12 (H) 08/29/2017   ESRSEDRATE 46 (H) 08/29/2017         Note: Above Lab results reviewed.  Recent Imaging Review  DG PAIN CLINIC C-ARM 1-60 MIN NO REPORT Fluoro was used, but no Radiologist interpretation will be provided.  Please refer to "NOTES" tab for provider progress note. Note: Reviewed        Physical Exam  General appearance: Well nourished, well developed, and well hydrated. In no apparent acute distress Mental status: Alert, oriented x 3 (person, place, & time)       Respiratory: No evidence of acute respiratory distress Eyes: PERLA Vitals: BP 115/76   Pulse 79   Temp (!) 97 F (36.1 C)   Resp 16   Ht 5' 4.5" (1.638 m)   Wt 188 lb (85.3 kg)   SpO2 99%   BMI 31.77 kg/m  BMI: Estimated body mass index is 31.77 kg/m as calculated from the following:   Height as of this encounter: 5' 4.5" (1.638 m).   Weight as of this encounter: 188 lb (85.3 kg). Ideal: Ideal body weight: 55.8 kg (123 lb 2 oz) Adjusted ideal body weight: 67.6 kg (149 lb 1.2 oz)  Assessment   Status Diagnosis  Controlled Controlled Controlled 1. Chronic shoulder pain (Right)   2. Arthralgia of shoulder region (Right)   3. Shoulder enthesopathy (Right)   4. Tendinitis of rotator cuff (Right)   5. Chronic pain syndrome   6. Pharmacologic therapy   7. Chronic use of opiate for therapeutic purpose   8. Chronic lower extremity pain (2ry area of Pain) (Left)   9. Chronic upper back pain (3ry area of Pain) (Bilateral) (L>R)   10. Chronic neck pain (4th area of Pain) (Bilateral) (L>R)   11. Chronic low back pain (Primary Area of Pain) (Bilateral) w/ sciatica (Left)      Updated Problems: No problems  updated.  Plan of Care  Problem-specific:  No problem-specific Assessment & Plan notes found for this encounter.  [name] has a current medication list which includes the following long-term medication(s): albuterol, atenolol, fluticasone-salmeterol, gabapentin, ipratropium-albuterol, lisinopril, loratadine, lubiprostone, magnesium, meloxicam, mirabegron er, montelukast, omeprazole, paliperidone, prazosin, sertraline, [START ON 01/02/2021] tramadol, trazodone, and benefiber.  Pharmacotherapy (Medications Ordered): Meds ordered this encounter  Medications   traMADol (ULTRAM) 50 MG tablet    Sig: Take 1-2 tablets (50-100 mg total) by mouth every 6 (six) hours as needed for severe pain. Each refill must last 30 days.    Dispense:  170 tablet    Refill:  5    Not a duplicate. Do NOT delete! Dispense 1 day early if closed on refill date. Avoid benzodiazepines within 8 hours of opioids. Do not send refill requests.    Orders:  No orders of the defined types were placed in this encounter.  Follow-up plan:   Return in about 7 months (around 07/01/2021) for Eval-day (M,W), (F2F), (MM).     Interventional Therapies  Risk  Complexity Considerations:  Estimated body mass index is 31.77 kg/m as calculated from the following:   Height as of this encounter: 5' 4.5" (1.638 m).   Weight as of this encounter: 188 lb (85.3 kg). NOTE: LATEX ALLERGY & Hx Hepatitis C (w/ cirrhosis)   Planned  Pending:      Under consideration:      Completed:   Diagnostic right glenohumeral/AC  inj. x3 (09/23/2020) (100/100/80/80) Diagnostic bilateral lumbar facet MBB x3 (06/03/2019) (100/100/90/90) Diagnostic bilateral cervical facet MBB x1 (11/06/2017) (100/100/90) Diagnostic left caudal ESI x1 (08/14/2019) (100/100/100 x8 days/90-100) Diagnostic right cervical ESI x2 (05/04/2020) (100/100/95/95)   Therapeutic  Palliative (PRN) options:   Therapeutic/palliative right glenohumeral/AC  inj. #4   Therapeutic/palliative bilateral lumbar facet MBB #4  Diagnostic bilateral cervical facet MBB #2  Therapeutic/palliative left caudal ESI #2  Therapeutic/palliative right cervical ESI #3    Recent Visits Date Type Provider Dept  09/23/20 Procedure visit Milinda Pointer, MD [facility]  Showing recent visits within past 90 days and meeting all other requirements Today's Visits Date Type Provider Dept  12/09/20 Office Visit Milinda Pointer, MD [facility]  Showing today's visits and meeting all other requirements Future Appointments No visits were found meeting these conditions. Showing future appointments within next 90 days and meeting all other requirements I discussed the assessment and treatment plan with the patient. The patient was provided an opportunity to ask questions and all were answered. The patient agreed with the plan and demonstrated an understanding of the instructions.  Patient advised to call back or seek an in-person evaluation if the symptoms or condition worsens.  Duration of encounter: 30 minutes.  Note by: Gaspar Cola, MD Date: 12/09/2020; Time: 3:13 PM

## 2020-12-09 NOTE — Telephone Encounter (Signed)
Rachael West was scheduled for her MRI. She received call from radiology telling her the machine was down and they had to cancel. She did not get call to reschedule the appointment and did not reschedule it herself.   Order is expired. Patient wants to have this done at [facility].  Please ask Dr. Dossie Arbour to place new order and I will fax to [facility]

## 2020-12-20 ENCOUNTER — Other Ambulatory Visit: Payer: Self-pay | Admitting: Physician Assistant

## 2020-12-20 DIAGNOSIS — M1991 Primary osteoarthritis, unspecified site: Secondary | ICD-10-CM

## 2020-12-23 ENCOUNTER — Encounter: Payer: Self-pay | Admitting: Physician Assistant

## 2020-12-23 ENCOUNTER — Ambulatory Visit (INDEPENDENT_AMBULATORY_CARE_PROVIDER_SITE_OTHER): Payer: Medicare Other | Admitting: Physician Assistant

## 2020-12-23 ENCOUNTER — Other Ambulatory Visit: Payer: Self-pay | Admitting: Physician Assistant

## 2020-12-23 DIAGNOSIS — K5900 Constipation, unspecified: Secondary | ICD-10-CM | POA: Diagnosis not present

## 2020-12-23 DIAGNOSIS — J4520 Mild intermittent asthma, uncomplicated: Secondary | ICD-10-CM

## 2020-12-23 DIAGNOSIS — K5903 Drug induced constipation: Secondary | ICD-10-CM

## 2020-12-23 MED ORDER — OMEPRAZOLE 40 MG PO CPDR: DELAYED_RELEASE_CAPSULE | ORAL | 3 refills | Status: DC

## 2020-12-23 MED ORDER — LUBIPROSTONE 24 MCG PO CAPS: 24.0000 ug | ORAL_CAPSULE | Freq: Two times a day (BID) | ORAL | 11 refills | Status: DC

## 2020-12-23 MED ORDER — ONDANSETRON HCL 4 MG PO TABS: 4.0000 mg | ORAL_TABLET | Freq: Two times a day (BID) | ORAL | 3 refills | Status: DC

## 2020-12-23 NOTE — Patient Instructions (Addendum)
If you are age 67 or older, your body mass index should be between 23-30. Your Body mass index is 31.37 kg/m. If this is out of the aforementioned range listed, please consider follow up with your Primary Care Provider. ________________________________________________________  The [facility] providers would like to encourage you to use [facility] to communicate with providers for non-urgent requests or questions.  Due to long hold times on the telephone, sending your provider a message by [facility] may be a faster and more efficient way to get a response.  Please allow 48 business hours for a response.  Please remember that this is for non-urgent requests.  _______________________________________________________   We have sent refills of Omeprazole, Ondansetron and Amitiza to your pharmacy.  Use Miralax 1 capful in 8 ounces of water or juice daily as needed.  You have been scheduled to follow up on February 09, 2021 at 3:00 pm. We will be discussing your colonoscopy and any symptoms that may not have resolved.   Call the office if you are not doing better in the next few weeks and ask to speak with Beth.  Thank you for entrusting me with your care and choosing [facility].  Amy Esterwood, PA-C

## 2020-12-23 NOTE — Progress Notes (Signed)
Subjective:    Patient ID: Rachael West, female    DOB: 28-Apr-1955, 65 y.o.   MRN: [id number]  HPI Josselin is a pleasant 65 year old African-American female, established with Dr. Loletha Carrow who comes in today for medication refills, and also with complaints of recurrent problems with heartburn and indigestion as well as nausea and intermittent episodes of vomiting. Patient has history of chronic GERD, and gastritis.  She had EGD in December 2020 which showed a normal esophagus, patchy gastritis and normal duodenum.  Gastric biopsies negative for H. pylori.  Subsequent gastric emptying scan was normal. Last colonoscopy 2013 per Dr. Oletta Lamas was normal with the exception of internal hemorrhoids. Last CT imaging July 2021 showed a normal-appearing liver, no suspicious lymphadenopathy to suggest recurrent lymphoma and a 13 mm area of groundglass nodule in the left lower lobe. Patient has history of hypertension, history of hepatitis C, status posttreatment with Harvoni with eradication, she has not had evidence of cirrhosis by imaging or labs.  She has chronic pain syndrome, fibromyalgia, anxiety, degenerative disc disease and prior history of large B-cell lymphoma, 2016, followed by Dr. Lindi Adie.  Patient says as long as she has her medication she usually does well and had been on omeprazole 40 mg p.o. every morning.  She has run out of omeprazole and Zofran. She had been using the Zofran once daily in the morning which she found helpful and lasted most of the day to help prevent any nausea or vomiting. She continues on Amitiza 24 mcg twice daily also uses a fiber supplement and as needed MiraLAX and says that with the use of the Amitiza she will have regular bowel movements. She has been having a lot of the heartburn and indigestion, feeling as if her food is wanting to come up into her chest and sometimes "sit in her chest".  No complaints of abdominal pain currently.  No current NSAID use        Review of Systems Pertinent positive and negative review of systems were noted in the above HPI section.  All other review of systems was otherwise negative.   Outpatient Encounter Medications as of 12/23/2020  Medication Sig   albuterol (VENTOLIN HFA) 108 (90 Base) MCG/ACT inhaler INHALE 2 PUFFS BY MOUTH INTO THE LUNGS 4 TIMES DAILY **PLEASE CONTACT OUR OFFICE TO SCHEDULE A FOLLOW UP FOR FUTURE MED REFILLS**   atenolol (TENORMIN) 25 MG tablet TAKE 1 TABLET BY MOUTH EVERY DAY   b complex vitamins tablet Take 1 tablet by mouth daily.   busPIRone (BUSPAR) 5 MG tablet Take 1 tablet (5 mg total) by mouth 2 (two) times daily.   dimenhyDRINATE (DRAMAMINE) 50 MG tablet Take 50 mg by mouth every 8 (eight) hours as needed.   Fluticasone-Salmeterol (ADVAIR DISKUS) 250-50 MCG/DOSE AEPB Inhale 1 puff into the lungs 2 (two) times daily.   gabapentin (NEURONTIN) 100 MG capsule Take 2 capsules (200 mg total) by mouth at bedtime. Follow written titration schedule.   hydrOXYzine (ATARAX/VISTARIL) 50 MG tablet Take 50 mg by mouth at bedtime.   hydrOXYzine (VISTARIL) 50 MG capsule Take 50 mg by mouth at bedtime.   ipratropium-albuterol (DUONEB) 0.5-2.5 (3) MG/3ML SOLN INHALE 3 MLS VIA NEBULIZER INTO THE LUNGS. SEE ADMIN INSTRUCTIONS   latanoprost (XALATAN) 0.005 % ophthalmic solution Place 1 drop into both eyes daily.   lisinopril (ZESTRIL) 5 MG tablet Take 1 tablet (5 mg total) by mouth daily.   loratadine (CLARITIN) 10 MG tablet Take 1 tablet by mouth daily.  Magnesium 500 MG CAPS Take 1 capsule (500 mg total) by mouth 2 (two) times daily at 8 am and 10 pm.   meloxicam (MOBIC) 15 MG tablet TAKE 1 TABLET BY MOUTH EVERY DAY   mirabegron ER (MYRBETRIQ) 25 MG TB24 tablet Take 1 tablet (25 mg total) by mouth daily. Needs to come from the Urologist- no further RFs will be provided   montelukast (SINGULAIR) 10 MG tablet TAKE 1 TABLET BY MOUTH EVERY DAY   paliperidone (INVEGA) 6 MG 24 hr tablet Take 6 mg by mouth  every morning.   polyethylene glycol (MIRALAX / GLYCOLAX) packet Take 17 g by mouth 2 (two) times daily.   prazosin (MINIPRESS) 1 MG capsule Take 2 mg by mouth at bedtime.   sertraline (ZOLOFT) 100 MG tablet Take 200 mg by mouth every morning.   [START ON 01/02/2021] traMADol (ULTRAM) 50 MG tablet Take 1-2 tablets (50-100 mg total) by mouth every 6 (six) hours as needed for severe pain. Each refill must last 30 days.   traZODone (DESYREL) 50 MG tablet 1 TABLET BY MOUTH AT BEDTIME   [DISCONTINUED] lubiprostone (AMITIZA) 24 MCG capsule Take 1 capsule (24 mcg total) by mouth 2 (two) times daily with a meal. Swallow the medication whole. Do not break or chew the medication.   [DISCONTINUED] omeprazole (PRILOSEC) 40 MG capsule 1 po qd Needs a follow up for additional refills 10-02-2019   [DISCONTINUED] ondansetron (ZOFRAN) 4 MG tablet TAKE 1 TABLET BY MOUTH EVERY 8 HOURS AS NEEDED FOR NAUSEA AND VOMITING   lubiprostone (AMITIZA) 24 MCG capsule Take 1 capsule (24 mcg total) by mouth 2 (two) times daily with a meal. Swallow the medication whole. Do not break or chew the medication.   omeprazole (PRILOSEC) 40 MG capsule 1 po qd   ondansetron (ZOFRAN) 4 MG tablet Take 1 tablet (4 mg total) by mouth 2 (two) times daily.   Wheat Dextrin (BENEFIBER) POWD Take 6 g by mouth 3 (three) times daily before meals. (2 tsp = 6 g)   [DISCONTINUED] omeprazole (PRILOSEC) 40 MG capsule 1 po qd Needs a follow up for additional refills 10-02-2019   No facility-administered encounter medications on file as of 12/23/2020.   Allergies  Allergen Reactions   Latex Hives   Penicillins Hives   Codeine Anxiety   Cortisone Swelling   Sulfa Antibiotics    Patient Active Problem List   Diagnosis Date Noted   Trigger finger, middle finger (Right) 09/23/2020   Trigger point of shoulder region (Right) 09/23/2020   Chronic use of opiate for therapeutic purpose 07/26/2020   Abnormal MRI, cervical spine (11/12/2019) 04/15/2020    Chronic neuropathic pain 04/15/2020   Disturbance of skin sensation 04/15/2020   Hyperalgesia 04/15/2020   Musculoskeletal disorder involving upper trapezius muscle (Right) 04/07/2020   Arthralgia of shoulder region (Right) 11/27/2019   Shoulder enthesopathy (Right) 11/27/2019   Chronic shoulder pain (Right) 11/13/2019   Cervical disc disorder at C5-C6 level with radiculopathy (Right) 08/07/2019   Cervical radiculopathy at C6 (Right) 08/07/2019   Osteoarthrosis of multiple sites 08/07/2019   Radicular pain of upper extremity (Right) 08/07/2019   Chronic upper extremity pain (Right) 08/07/2019   Mood disorder ([place]) 04/28/2019   Chronic low back pain (1ry area of Pain) (Bilateral) (L>R) w/o sciatica 09/30/2018   Chronic musculoskeletal pain 07/31/2018   Therapeutic opioid-induced constipation (OIC) 07/31/2018   Encounter for Medicare annual wellness exam 05/06/2018   Gait disturbance 11/28/2017   Cervical facet syndrome (Bilateral) (R>L) 10/24/2017  Spondylosis without myelopathy or radiculopathy, cervical region 10/24/2017   Latex precautions, history of latex allergy 10/09/2017   Elevated uric acid in blood 09/27/2017   Chronic idiopathic gout 09/27/2017   DDD (degenerative disc disease), cervical 09/27/2017   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) 09/27/2017   Chronic shoulder pain (Bilateral) (R>L) 09/26/2017   Chronic hip pain (Bilateral) (L>R) 09/26/2017   Cervicalgia 09/26/2017   DDD (degenerative disc disease), lumbar 09/25/2017   Lumbar facet arthropathy 09/25/2017   Lumbar facet syndrome (Bilateral) (L>R) 09/25/2017   Spondylosis without myelopathy or radiculopathy, lumbar region 09/25/2017   DDD (degenerative disc disease), thoracic 09/25/2017   Thoracolumbar dextroscoliosis 09/25/2017   Vitamin D deficiency 09/25/2017   Cervicogenic headache 09/25/2017   Other dysphagia 09/24/2017   Tendinitis of rotator cuff (Left) 09/24/2017   Tendinitis of rotator  cuff (Right) 09/24/2017   Fibromyalgia 09/24/2017   Elevated C-reactive protein (CRP) 09/03/2017   Elevated sed rate 09/03/2017   Chronic low back pain (Primary Area of Pain) (Bilateral) w/ sciatica (Left) 08/29/2017   Chronic lower extremity pain (2ry area of Pain) (Left) 08/29/2017   Chronic upper back pain (3ry area of Pain) (Bilateral) (L>R) 08/29/2017   Chronic neck pain (4th area of Pain) (Bilateral) (L>R) 08/29/2017   Occipital headache 08/29/2017   Chronic knee pain (Bilateral) (R>L) 08/29/2017   Chronic pain syndrome 08/29/2017   Pharmacologic therapy 08/29/2017   Long term current use of opiate analgesic 08/29/2017   Disorder of skeletal system 08/29/2017   Problems influencing health status 08/29/2017   Chronic sacroiliac joint pain 08/29/2017   Hyperlipidemia 07/31/2017   GERD (gastroesophageal reflux disease) 05/01/2017   Anxiety 05/01/2017   Depression 05/01/2017   Healthcare maintenance 05/01/2017   Chronic idiopathic constipation 05/01/2017   Dyspnea 10/22/2014   Obesity (BMI 30-39.9) 10/22/2014   Obesity 09/12/2014   Essential hypertension 09/12/2014   Pneumonia, lobar ([place]) 09/11/2014   Intermittent asthma 09/11/2014   Cirrhosis ([place]) 09/08/2014   Chronic hepatitis C without hepatic coma ([place]) 01/15/2014   Large B-cell lymphoma ([place]) 01/12/2014   Constipation 04/24/2012   Anemia 05/25/2011   Atypical chest pain 05/25/2011   History of thyroid disease 05/25/2011   Hepatitis C 05/25/2011   Benign hypertension 05/25/2011   Gastritis 05/25/2011   Asthma 05/25/2011   Stress incontinence 02/10/2011   Urge incontinence 02/10/2011   Social History   Socioeconomic History   Marital status: Divorced    Spouse name: Not on file   Number of children: Not on file   Years of education: Not on file   Highest education level: Not on file  Occupational History   Occupation: Disabled  Tobacco Use   Smoking status: Former    Packs/day: 1.50    Years: 20.00     Pack years: 30.00    Types: Cigarettes    Quit date: 01/09/1990    Years since quitting: 30.9   Smokeless tobacco: Never  Vaping Use   Vaping Use: Never used  Substance and Sexual Activity   Alcohol use: No    Alcohol/week: 0.0 standard drinks   Drug use: No   Sexual activity: Not Currently  Other Topics Concern   Not on file  Social History Narrative   Not on file   Social Determinants of Health   Financial Resource Strain: Not on file  Food Insecurity: Not on file  Transportation Needs: Not on file  Physical Activity: Not on file  Stress: Not on file  Social Connections: Not on file  Intimate Partner Violence: Not on file    [name]'s family history includes Allergies in her mother; Asthma in her mother and sister; Breast cancer in her cousin; Colon cancer in her paternal aunt; Diabetes in her maternal grandfather and maternal grandmother; Heart disease in her brother, father, paternal grandfather, and paternal grandmother; Liver cancer in her paternal uncle; Ovarian cancer in an other family member; Pancreatic cancer in an other family member.      Objective:    Vitals:   12/23/20 1457  BP: 134/62  Pulse: 60    Physical Exam.Well-developed well-nourished  older AA female  in no acute distress.  Height, Weight, 185 BMI 31.3  HEENT; nontraumatic normocephalic, EOMI, PE R LA, sclera anicteric. Oropharynx;not examined Neck; supple, no JVD Cardiovascular; regular rate and rhythm with S1-S2, no murmur rub or gallop Pulmonary; Clear bilaterally Abdomen; soft, nontender, nondistended, no palpable mass or hepatosplenomegaly, bowel sounds are active Rectal;not done today Skin; benign exam, no jaundice rash or appreciable lesions Extremities; no clubbing cyanosis or edema skin warm and dry Neuro/Psych; alert and oriented x4, grossly nonfocal mood and affect appropriate        Assessment & Plan:   #55 65 year old African-American female with history of chronic  GERD, exacerbation of symptoms after running out of omeprazole. She has also had component of chronic nausea and intermittent vomiting when GERD is poorly controlled.  She has also been out of Zofran. Previous gastric emptying scan normal. EGD 2020 with normal-appearing esophagus and patchy mild gastritis H. pylori negative\  #2 chronic constipation likely opioid induced-stable on Amitiza and as needed MiraLAX #3 colon cancer surveillance-negative colonoscopy 2013/Dr. Oletta Lamas will be due for follow-up early 2023 4.  History of hepatitis C, treated with Harvoni and eradicated, no evidence for cirrhosis by labs or imaging #5 history of large B-cell lymphoma 2016 6.  Chronic pain syndrome/fibromyalgia and degenerative disc disease 7.  Chronic anxiety 8.  Hypertension  Plan; we reviewed antireflux regimen, n.p.o. for 2 to 3 hours prior to bedtime and elevation of the back 45 degrees for sleep.  Continue GERD diet Refill omeprazole 40 mg p.o. every morning AC breakfast #30 and 11 refills Refill Zofran 4 mg p.o. twice daily as needed for nausea Continue Amitiza 24 mcg twice daily refill sent  Continue MiraLAX on a as needed basis and daily fiber supplement We will plan to have her follow-up in 2 months, asked her to call if she does not have any improvement in symptoms once she gets back on omeprazole and Zofran.  At follow-up visit we can arrange for surveillance colonoscopy.   Dorrell Mitcheltree S Milferd Ansell PA-C 12/23/2020   Cc: Lorrene Reid, PA-C

## 2020-12-24 ENCOUNTER — Ambulatory Visit (HOSPITAL_COMMUNITY)
Admission: RE | Admit: 2020-12-24 | Discharge: 2020-12-24 | Disposition: A | Discharge status: Home / Self Care | Payer: Medicare Other | Source: Ambulatory Visit | Attending: Pain Medicine | Admitting: Pain Medicine

## 2020-12-24 ENCOUNTER — Other Ambulatory Visit: Payer: Self-pay

## 2020-12-24 DIAGNOSIS — M778 Other enthesopathies, not elsewhere classified: Secondary | ICD-10-CM | POA: Diagnosis present

## 2020-12-24 DIAGNOSIS — M25511 Pain in right shoulder: Secondary | ICD-10-CM | POA: Diagnosis present

## 2020-12-24 DIAGNOSIS — G8929 Other chronic pain: Secondary | ICD-10-CM | POA: Insufficient documentation

## 2020-12-24 DIAGNOSIS — M7581 Other shoulder lesions, right shoulder: Secondary | ICD-10-CM | POA: Insufficient documentation

## 2020-12-29 ENCOUNTER — Encounter: Payer: Medicare Other | Admitting: Pain Medicine

## 2021-01-04 NOTE — Progress Notes (Signed)
____________________________________________________________  Attending physician addendum:  Thank you for sending this case to me. I have reviewed the entire note and agree with the plan.  When she follows up and colonoscopy is planned, please be sure she gets a split dose GoLytely prep because of chronic constipation.  Wilfrid Lund, MD  ____________________________________________________________

## 2021-01-09 ENCOUNTER — Other Ambulatory Visit: Payer: Self-pay | Admitting: Pain Medicine

## 2021-01-09 ENCOUNTER — Other Ambulatory Visit: Payer: Self-pay | Admitting: Physician Assistant

## 2021-01-09 DIAGNOSIS — G8929 Other chronic pain: Secondary | ICD-10-CM

## 2021-01-09 DIAGNOSIS — J4520 Mild intermittent asthma, uncomplicated: Secondary | ICD-10-CM

## 2021-01-09 DIAGNOSIS — M542 Cervicalgia: Secondary | ICD-10-CM

## 2021-01-09 DIAGNOSIS — G894 Chronic pain syndrome: Secondary | ICD-10-CM

## 2021-01-09 DIAGNOSIS — Z79899 Other long term (current) drug therapy: Secondary | ICD-10-CM

## 2021-01-09 DIAGNOSIS — Z79891 Long term (current) use of opiate analgesic: Secondary | ICD-10-CM

## 2021-01-17 ENCOUNTER — Other Ambulatory Visit: Payer: Self-pay | Admitting: Physician Assistant

## 2021-01-17 DIAGNOSIS — I10 Essential (primary) hypertension: Secondary | ICD-10-CM

## 2021-01-17 DIAGNOSIS — J4520 Mild intermittent asthma, uncomplicated: Secondary | ICD-10-CM

## 2021-01-17 DIAGNOSIS — M1991 Primary osteoarthritis, unspecified site: Secondary | ICD-10-CM

## 2021-01-23 ENCOUNTER — Other Ambulatory Visit: Payer: Self-pay | Admitting: Physician Assistant

## 2021-01-23 DIAGNOSIS — J4520 Mild intermittent asthma, uncomplicated: Secondary | ICD-10-CM

## 2021-01-27 ENCOUNTER — Encounter: Payer: Self-pay | Admitting: Physician Assistant

## 2021-01-27 ENCOUNTER — Other Ambulatory Visit: Payer: Self-pay

## 2021-01-27 ENCOUNTER — Ambulatory Visit (INDEPENDENT_AMBULATORY_CARE_PROVIDER_SITE_OTHER): Payer: Medicare Other | Admitting: Physician Assistant

## 2021-01-27 VITALS — BP 138/73 | HR 94 | Temp 97.2°F | Ht 65.0 in | Wt 182.0 lb

## 2021-01-27 DIAGNOSIS — F39 Unspecified mood [affective] disorder: Secondary | ICD-10-CM

## 2021-01-27 DIAGNOSIS — I10 Essential (primary) hypertension: Secondary | ICD-10-CM | POA: Diagnosis not present

## 2021-01-27 DIAGNOSIS — M1991 Primary osteoarthritis, unspecified site: Secondary | ICD-10-CM

## 2021-01-27 DIAGNOSIS — C8510 Unspecified B-cell lymphoma, unspecified site: Secondary | ICD-10-CM | POA: Diagnosis not present

## 2021-01-27 DIAGNOSIS — J4520 Mild intermittent asthma, uncomplicated: Secondary | ICD-10-CM

## 2021-01-27 DIAGNOSIS — E785 Hyperlipidemia, unspecified: Secondary | ICD-10-CM

## 2021-01-27 DIAGNOSIS — Z1321 Encounter for screening for nutritional disorder: Secondary | ICD-10-CM

## 2021-01-27 DIAGNOSIS — Z13228 Encounter for screening for other metabolic disorders: Secondary | ICD-10-CM

## 2021-01-27 DIAGNOSIS — Z1329 Encounter for screening for other suspected endocrine disorder: Secondary | ICD-10-CM

## 2021-01-27 DIAGNOSIS — Z130 Encounter for screening for diseases of the blood and blood-forming organs and certain disorders involving the immune mechanism: Secondary | ICD-10-CM

## 2021-01-27 MED ORDER — MELOXICAM 15 MG PO TABS: 15.0000 mg | ORAL_TABLET | Freq: Every day | ORAL | 0 refills | Status: DC

## 2021-01-27 MED ORDER — LISINOPRIL 5 MG PO TABS: 5.0000 mg | ORAL_TABLET | Freq: Every day | ORAL | 1 refills | Status: DC

## 2021-01-27 MED ORDER — ATENOLOL 25 MG PO TABS: ORAL_TABLET | ORAL | 1 refills | Status: DC

## 2021-01-27 NOTE — Assessment & Plan Note (Signed)
Followed by Oncology

## 2021-01-27 NOTE — Assessment & Plan Note (Signed)
-  BP initially elevated, BP repeated and improved. -Continue current medication regimen. Provided refills. -Will continue to monitor. Will collect CMP for medication monitoring.

## 2021-01-27 NOTE — Progress Notes (Signed)
Established Patient Office Visit  Subjective:  Patient ID: Rachael West, female    DOB: 15-Oct-1955  Age: 66 y.o. MRN: [id number]  CC:  Chief Complaint  Patient presents with   Follow-up    HPI Rachael West presents for follow up on hypertension, hyperlipidemia and mood. Requesting medication refills including meloxicam which she takes daily for joint pain. States medication really helps. Patient denies upset stomach or change in stool color from her norm. Is fasting for blood-work.   HTN: Pt denies chest pain, palpitations, dizziness or lower extremity swelling. Taking medication as directed without side effects but did run out of Lisinopril for a couple of days. Has not checked blood pressure at home.   HLD: Pt not on medication therapy. Patient reports with the Holidays she has not been eating well.  Mood: Followed by psychiatry. Reports likes it when her family is over. Reports medication compliance.   Asthma: Reports did have to do 3 breathing treatments when the weather was really cold due to wheezing and shortness of breath. States has not used her albuterol inhaler or done breathing treatment since then.  Past Medical History:  Diagnosis Date   Anemia    Anxiety    Arthritis    Asthma    Depression    GERD (gastroesophageal reflux disease)    Hepatitis-C    History of chemotherapy    Hypertension    Insomnia    Lymphoma ([place]) 12/2013   aggressive lymphoma   Personal history of non-Hodgkin lymphomas    Vitamin D deficiency     Past Surgical History:  Procedure Laterality Date   ABDOMINAL HYSTERECTOMY     APPENDECTOMY     BREAST BIOPSY Left 2015   lymphoma   COLONOSCOPY  01/17/2011   Procedure: COLONOSCOPY;  Surgeon: Winfield Cunas., MD;  Location: [facility];  Service: Endoscopy;  Laterality: N/A;   ESOPHAGOGASTRODUODENOSCOPY  01/17/2011   Procedure: ESOPHAGOGASTRODUODENOSCOPY (EGD);  Surgeon: Winfield Cunas., MD;  Location: [facility];  Service: Endoscopy;  Laterality: N/A;   TRIGGER FINGER RELEASE      Family History  Problem Relation Age of Onset   Asthma Mother    Allergies Mother    Asthma Sister    Heart disease Father    Heart disease Brother    Diabetes Maternal Grandmother    Diabetes Maternal Grandfather    Heart disease Paternal Grandmother    Heart disease Paternal Grandfather    Colon cancer Paternal Aunt    Liver cancer Paternal Uncle    Breast cancer Cousin    Ovarian cancer Other        great aunt (m)   Pancreatic cancer Other        great aunt (m)   Stomach cancer Neg Hx    Rectal cancer Neg Hx    Esophageal cancer Neg Hx     Social History   Socioeconomic History   Marital status: Divorced    Spouse name: Not on file   Number of children: Not on file   Years of education: Not on file   Highest education level: Not on file  Occupational History   Occupation: Disabled  Tobacco Use   Smoking status: Former    Packs/day: 1.50    Years: 20.00    Pack years: 30.00    Types: Cigarettes    Quit date: 01/09/1990    Years since quitting: 31.0   Smokeless tobacco: Never  Vaping Use  Vaping Use: Never used  Substance and Sexual Activity   Alcohol use: No    Alcohol/week: 0.0 standard drinks   Drug use: No   Sexual activity: Not Currently  Other Topics Concern   Not on file  Social History Narrative   Not on file   Social Determinants of Health   Financial Resource Strain: Not on file  Food Insecurity: Not on file  Transportation Needs: Not on file  Physical Activity: Not on file  Stress: Not on file  Social Connections: Not on file  Intimate Partner Violence: Not on file    Outpatient Medications Prior to Visit  Medication Sig Dispense Refill   albuterol (VENTOLIN HFA) 108 (90 Base) MCG/ACT inhaler INHALE 2 PUFFS BY MOUTH INTO THE LUNGS 4 TIMES DAILY. CONTACT OFFICE FOR FOLLOW UP FOR REFILLS** 8.5 each 0   b complex vitamins tablet Take 1 tablet by mouth daily.      busPIRone (BUSPAR) 5 MG tablet Take 1 tablet (5 mg total) by mouth 2 (two) times daily.     dimenhyDRINATE (DRAMAMINE) 50 MG tablet Take 50 mg by mouth every 8 (eight) hours as needed.     Fluticasone-Salmeterol (ADVAIR DISKUS) 250-50 MCG/DOSE AEPB Inhale 1 puff into the lungs 2 (two) times daily. 1 each 2   gabapentin (NEURONTIN) 100 MG capsule Take 2 capsules (200 mg total) by mouth at bedtime. Follow written titration schedule. 60 capsule 5   hydrOXYzine (ATARAX/VISTARIL) 50 MG tablet Take 50 mg by mouth at bedtime.     hydrOXYzine (VISTARIL) 50 MG capsule Take 50 mg by mouth at bedtime.     ipratropium-albuterol (DUONEB) 0.5-2.5 (3) MG/3ML SOLN INHALE 3 MLS VIA NEBULIZER INTO THE LUNGS. SEE ADMIN INSTRUCTIONS **PLEASE CONTACT OUR OFFICE TO SCHEDULE A FOLLOW UP FOR FUTURE MED REFILLS** 120 mL 0   latanoprost (XALATAN) 0.005 % ophthalmic solution Place 1 drop into both eyes daily.     loratadine (CLARITIN) 10 MG tablet Take 1 tablet by mouth daily.     lubiprostone (AMITIZA) 24 MCG capsule Take 1 capsule (24 mcg total) by mouth 2 (two) times daily with a meal. Swallow the medication whole. Do not break or chew the medication. 60 capsule 11   Magnesium 500 MG CAPS Take 1 capsule (500 mg total) by mouth 2 (two) times daily at 8 am and 10 pm. 180 capsule 0   mirabegron ER (MYRBETRIQ) 25 MG TB24 tablet Take 1 tablet (25 mg total) by mouth daily. Needs to come from the Urologist- no further RFs will be provided 30 tablet 0   montelukast (SINGULAIR) 10 MG tablet TAKE 1 TABLET BY MOUTH EVERY DAY 90 tablet 1   omeprazole (PRILOSEC) 40 MG capsule 1 po qd 90 capsule 3   ondansetron (ZOFRAN) 4 MG tablet Take 1 tablet (4 mg total) by mouth 2 (two) times daily. 60 tablet 3   paliperidone (INVEGA) 6 MG 24 hr tablet Take 6 mg by mouth every morning.     polyethylene glycol (MIRALAX / GLYCOLAX) packet Take 17 g by mouth 2 (two) times daily.     prazosin (MINIPRESS) 1 MG capsule Take 2 mg by mouth at bedtime.      sertraline (ZOLOFT) 100 MG tablet Take 200 mg by mouth every morning.     traMADol (ULTRAM) 50 MG tablet Take 1-2 tablets (50-100 mg total) by mouth every 6 (six) hours as needed for severe pain. Each refill must last 30 days. 170 tablet 5   traZODone (DESYREL) 50  MG tablet 1 TABLET BY MOUTH AT BEDTIME  5   Wheat Dextrin (BENEFIBER) POWD Take 6 g by mouth 3 (three) times daily before meals. (2 tsp = 6 g) 730 g 2   atenolol (TENORMIN) 25 MG tablet TAKE 1 TABLET BY MOUTH EVERY DAY  **PLEASE CONTACT OUR OFFICE TO SCHEDULE A FOLLOW UP FOR FUTURE MED REFILLS** 30 tablet 0   lisinopril (ZESTRIL) 5 MG tablet Take 1 tablet (5 mg total) by mouth daily. **PLEASE CONTACT OUR OFFICE TO SCHEDULE A FOLLOW UP FOR FUTURE MED REFILLS** 30 tablet 0   meloxicam (MOBIC) 15 MG tablet TAKE 1 TABLET BY MOUTH EVERY DAY 30 tablet 0   No facility-administered medications prior to visit.    Allergies  Allergen Reactions   Latex Hives   Penicillins Hives   Codeine Anxiety   Cortisone Swelling   Sulfa Antibiotics     ROS Review of Systems Review of Systems:  A fourteen system review of systems was performed and found to be positive as per HPI.   Objective:    Physical Exam General:  Well Developed, well nourished, appropriate for stated age.  Neuro:  Alert and oriented,  extra-ocular muscles intact  HEENT:  Normocephalic, atraumatic, neck supple  Skin:  no gross rash, warm, pink. Cardiac:  RRR, S1 S2 Respiratory: CTA B/L  Vascular:  Ext warm, no cyanosis apprec.; cap RF less 2 sec. Psych:  No HI/SI, judgement and insight good, Euthymic mood. Full Affect.  BP 138/73    Pulse 94    Temp (!) 97.2 F (36.2 C)    Ht _0  (1.651 m)    Wt 182 lb (82.6 kg)    SpO2 98%    BMI 30.29 kg/m  Wt Readings from Last 3 Encounters:  01/27/21 182 lb (82.6 kg)  12/23/20 185 lb 9.6 oz (84.2 kg)  12/09/20 188 lb (85.3 kg)     Health Maintenance Due  Topic Date Due   COVID-19 Vaccine (1) Never done   TETANUS/TDAP   Never done   Zoster Vaccines- Shingrix (1 of 2) Never done   Pneumonia Vaccine 10+ Years old (2 - PCV) 03/08/2016   DEXA SCAN  Never done   INFLUENZA VACCINE  08/09/2020   COLONOSCOPY (Pts 45-58yr Insurance coverage will need to be confirmed)  01/16/2021    There are no preventive care reminders to display for this patient.  Lab Results  Component Value Date   TSH 0.770 03/25/2020   Lab Results  Component Value Date   WBC 6.2 08/12/2020   HGB 11.2 (L) 08/12/2020   HCT 35.7 (L) 08/12/2020   MCV 82.3 08/12/2020   PLT 195 08/12/2020   Lab Results  Component Value Date   NA 139 08/12/2020   K 3.9 08/12/2020   CHLORIDE 106 08/03/2016   CO2 26 08/12/2020   GLUCOSE 83 08/12/2020   BUN 10 08/12/2020   CREATININE 0.85 08/12/2020   BILITOT 0.3 08/12/2020   ALKPHOS 79 08/12/2020   AST 18 08/12/2020   ALT 12 08/12/2020   PROT 7.3 08/12/2020   ALBUMIN 3.7 08/12/2020   CALCIUM 9.2 08/12/2020   ANIONGAP 9 08/12/2020   EGFR 85 03/25/2020   Lab Results  Component Value Date   CHOL 187 05/29/2018   Lab Results  Component Value Date   HDL 37 (L) 05/29/2018   Lab Results  Component Value Date   LDLCALC 125 (H) 05/29/2018   Lab Results  Component Value Date   TRIG 126  05/29/2018   Lab Results  Component Value Date   CHOLHDL 5.1 (H) 05/29/2018   Lab Results  Component Value Date   HGBA1C 5.3 03/25/2020   Depression screen [facility] 2/9 01/27/2021 07/29/2020 04/15/2020 04/07/2020 03/25/2020  Decreased Interest 3 2 0 0 1  Down, Depressed, Hopeless 3 2 0 0 1  PHQ - 2 Score 6 4 0 0 2  Altered sleeping 3 3 - - 1  Tired, decreased energy 3 1 - - 1  Change in appetite 3 2 - - 1  Feeling bad or failure about yourself  3 1 - - 1  Trouble concentrating 3 1 - - 1  Moving slowly or fidgety/restless 1 1 - - 1  Suicidal thoughts 0 0 - - 0  PHQ-9 Score 22 13 - - 8  Difficult doing work/chores Very difficult Somewhat difficult - - -  Some recent data might be hidden   GAD 7 :  Generalized Anxiety Score 01/27/2021 07/29/2020 11/17/2019 04/28/2019  Nervous, Anxious, on Edge _0 Control/stop worrying _1 Worry too much - different things _2 Trouble relaxing _3 Restless _4 Easily annoyed or irritable _5 0  Afraid - awful might happen _6 Total GAD 7 Score _7 Anxiety Difficulty Very difficult Somewhat difficult Very difficult Very difficult        Assessment & Plan:   Problem List Items Addressed This Visit       Cardiovascular and Mediastinum   Essential hypertension    -BP initially elevated, BP repeated and improved. -Continue current medication regimen. Provided refills. -Will continue to monitor. Will collect CMP for medication monitoring.      Relevant Medications   lisinopril (ZESTRIL) 5 MG tablet   atenolol (TENORMIN) 25 MG tablet   Other Relevant Orders   CBC w/Diff   Comp Met (CMET)   TSH   HgB A1c     Respiratory   Intermittent asthma    -Stable. -Continue current medication regimen. See med list. -Will continue to monitor.         Musculoskeletal and Integument   Osteoarthrosis of multiple sites (Chronic)    -Discussed with patient long-term complications/side effects with NSAID use and recommend to monitor for any symptoms. Patient on PPI therapy which provides GI protection. Provided refill of meloxicam. Also recommend non-pharmacologic therapy such as exercise.       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      Relevant Medications   meloxicam (MOBIC) 15 MG tablet   Hyperlipidemia    -Will collect lipid panel. -Last direct LDL elevated at 122. -Discussed heart healthy diet low in fat. -Will continue to monitor.      Relevant Medications   lisinopril (ZESTRIL) 5 MG tablet   atenolol (TENORMIN) 25 MG tablet   Other Relevant Orders   Lipid Profile   Mood disorder ([place]) - Primary    -Stable.  PHQ-9 and GAD-7 screenings at patient's baseline. -Followed by [facility]. -Recommend to continue medication regimen. See med list.      Other Visit Diagnoses     Screening for endocrine, nutritional, metabolic and immunity disorder       Relevant Orders   CBC w/Diff   Comp Met (CMET)  TSH   HgB A1c   Lipid Profile       Meds ordered this encounter  Medications   meloxicam (MOBIC) 15 MG tablet    Sig: Take 1 tablet (15 mg total) by mouth daily.    Dispense:  90 tablet    Refill:  0    Order Specific Question:   Supervising Provider    Answer:   Beatrice Lecher D [[id number]]   lisinopril (ZESTRIL) 5 MG tablet    Sig: Take 1 tablet (5 mg total) by mouth daily.    Dispense:  90 tablet    Refill:  1    Order Specific Question:   Supervising Provider    Answer:   Beatrice Lecher D [[id number]]   atenolol (TENORMIN) 25 MG tablet    Sig: TAKE 1 TABLET BY MOUTH EVERY DAY    Dispense:  90 tablet    Refill:  1    Order Specific Question:   Supervising Provider    Answer:   Beatrice Lecher D [[id number]]    Follow-up: Return in about 4 months (around 05/27/2021) for [facility] .    Lorrene Reid, PA-C

## 2021-01-27 NOTE — Assessment & Plan Note (Signed)
>>  ASSESSMENT AND PLAN FOR MOOD DISORDER WRITTEN ON 01/27/2021  6:03 PM BY ABONZA, MARITZA, PA-C  -Stable. PHQ-9 and GAD-7 screenings at patient's baseline. -Followed by [facility]. -Recommend to continue medication regimen. See med list.

## 2021-01-27 NOTE — Assessment & Plan Note (Addendum)
-  Discussed with patient long-term complications/side effects with NSAID use and recommend to monitor for any symptoms. Patient on PPI therapy which provides GI protection. Provided refill of meloxicam. Also recommend non-pharmacologic therapy such as exercise.

## 2021-01-27 NOTE — Patient Instructions (Signed)

## 2021-01-27 NOTE — Assessment & Plan Note (Signed)
-  Will collect lipid panel. -Last direct LDL elevated at 122. -Discussed heart healthy diet low in fat. -Will continue to monitor.

## 2021-01-27 NOTE — Assessment & Plan Note (Signed)
-  Stable.  ?-Continue current medication regimen. See med list. ?-Will continue to monitor. ?

## 2021-01-27 NOTE — Assessment & Plan Note (Signed)
-  Stable. PHQ-9 and GAD-7 screenings at patient's baseline. -Followed by [facility]. -Recommend to continue medication regimen. See med list.

## 2021-01-28 LAB — COMPREHENSIVE METABOLIC PANEL
ALT: 14 IU/L (ref 0–32)
AST: 23 IU/L (ref 0–40)
Albumin/Globulin Ratio: 1.3 (ref 1.2–2.2)
Albumin: 4.3 g/dL (ref 3.8–4.8)
Alkaline Phosphatase: 102 IU/L (ref 44–121)
BUN/Creatinine Ratio: 8 — ABNORMAL LOW (ref 12–28)
BUN: 6 mg/dL — ABNORMAL LOW (ref 8–27)
Bilirubin Total: 0.4 mg/dL (ref 0.0–1.2)
CO2: 25 mmol/L (ref 20–29)
Calcium: 10.1 mg/dL (ref 8.7–10.3)
Chloride: 102 mmol/L (ref 96–106)
Creatinine, Ser: 0.71 mg/dL (ref 0.57–1.00)
Globulin, Total: 3.3 g/dL (ref 1.5–4.5)
Glucose: 88 mg/dL (ref 70–99)
Potassium: 4.4 mmol/L (ref 3.5–5.2)
Sodium: 141 mmol/L (ref 134–144)
Total Protein: 7.6 g/dL (ref 6.0–8.5)
eGFR: 93 mL/min/{1.73_m2} (ref >59)

## 2021-01-28 LAB — CBC WITH DIFFERENTIAL/PLATELET
Basophils Absolute: 0 10*3/uL (ref 0.0–0.2)
Basos: 1 %
EOS (ABSOLUTE): 0.1 10*3/uL (ref 0.0–0.4)
Eos: 2 %
Hematocrit: 39.4 % (ref 34.0–46.6)
Hemoglobin: 12.7 g/dL (ref 11.1–15.9)
Immature Grans (Abs): 0 10*3/uL (ref 0.0–0.1)
Immature Granulocytes: 0 %
Lymphocytes Absolute: 2.3 10*3/uL (ref 0.7–3.1)
Lymphs: 46 %
MCH: 25.5 pg — ABNORMAL LOW (ref 26.6–33.0)
MCHC: 32.2 g/dL (ref 31.5–35.7)
MCV: 79 fL (ref 79–97)
Monocytes Absolute: 0.4 10*3/uL (ref 0.1–0.9)
Monocytes: 8 %
Neutrophils Absolute: 2.1 10*3/uL (ref 1.4–7.0)
Neutrophils: 43 %
Platelets: 221 10*3/uL (ref 150–450)
RBC: 4.98 x10E6/uL (ref 3.77–5.28)
RDW: 13.5 % (ref 11.7–15.4)
WBC: 5 10*3/uL (ref 3.4–10.8)

## 2021-01-28 LAB — LIPID PANEL
Chol/HDL Ratio: 5.2 ratio — ABNORMAL HIGH (ref 0.0–4.4)
Cholesterol, Total: 250 mg/dL — ABNORMAL HIGH (ref 100–199)
HDL: 48 mg/dL (ref >39)
LDL Chol Calc (NIH): 178 mg/dL — ABNORMAL HIGH (ref 0–99)
Triglycerides: 130 mg/dL (ref 0–149)
VLDL Cholesterol Cal: 24 mg/dL (ref 5–40)

## 2021-01-28 LAB — HEMOGLOBIN A1C
Est. average glucose Bld gHb Est-mCnc: 123 mg/dL
Hgb A1c MFr Bld: 5.9 % — ABNORMAL HIGH (ref 4.8–5.6)

## 2021-01-28 LAB — TSH: TSH: 0.934 u[IU]/mL (ref 0.450–4.500)

## 2021-01-31 ENCOUNTER — Telehealth: Payer: Self-pay | Admitting: Pain Medicine

## 2021-01-31 ENCOUNTER — Other Ambulatory Visit: Payer: Self-pay | Admitting: Pain Medicine

## 2021-01-31 ENCOUNTER — Telehealth: Payer: Self-pay

## 2021-01-31 DIAGNOSIS — G8929 Other chronic pain: Secondary | ICD-10-CM

## 2021-01-31 DIAGNOSIS — M5442 Lumbago with sciatica, left side: Secondary | ICD-10-CM

## 2021-01-31 DIAGNOSIS — Z79891 Long term (current) use of opiate analgesic: Secondary | ICD-10-CM

## 2021-01-31 DIAGNOSIS — G894 Chronic pain syndrome: Secondary | ICD-10-CM

## 2021-01-31 DIAGNOSIS — M79605 Pain in left leg: Secondary | ICD-10-CM

## 2021-01-31 DIAGNOSIS — M542 Cervicalgia: Secondary | ICD-10-CM

## 2021-01-31 DIAGNOSIS — Z79899 Other long term (current) drug therapy: Secondary | ICD-10-CM

## 2021-01-31 MED ORDER — TRAMADOL HCL 50 MG PO TABS: 50.0000 mg | ORAL_TABLET | Freq: Four times a day (QID) | ORAL | 5 refills | Status: DC | PRN

## 2021-01-31 NOTE — Telephone Encounter (Signed)
Called pharm. And spoke with Cristie Hem. Prescription for 6/22 was cancelled. Dr Lowella Dandy sending in three new prescriptions as Cristie Hem stated that they have not received the ones written 12/22. I then called Ms Sabra Heck to ask for permission for Dr Lowella Dandy to write the Drug store about missing prescription for Dec. Patient states that it was OK for him to do this.

## 2021-01-31 NOTE — Progress Notes (Signed)
Today I had to send another prescription [facility]adol to [facility] since they claim not to have the 1 that I sent in December.  Issue was taken up with [facility] after having secured Ms. Murray's permission to discuss her case with the pharmacy.  The pharmacist that Jenny Reichmann spoke to and that claimed not to have any other prescriptions was "Cristie Hem".

## 2021-01-31 NOTE — Telephone Encounter (Signed)
Called pharmacy and they stated that they did not receive any prescriptions For Tramadol 50mg  dated 12/09/21.  Called patient to inform her that a message was left for Dr Dossie Arbour to re write prescription

## 2021-02-03 ENCOUNTER — Telehealth: Payer: Self-pay | Admitting: Pain Medicine

## 2021-02-03 ENCOUNTER — Telehealth: Payer: Self-pay | Admitting: *Deleted

## 2021-02-03 NOTE — Telephone Encounter (Signed)
Returned patient phone call,  she is gone taking her mother to appt.  I will attempt to reach her back around 1:00, or she may call us back when she returns.  Husband will give her the message.

## 2021-02-03 NOTE — Telephone Encounter (Signed)
Patient called in about her Tramadol and qty amount.  The qty 170 it what has been sent in several times.  There was a qty of 240 prescribed back in March.  Have instructed patient that this a prn medication and he does not want her to take it around the clock, only as needed.

## 2021-02-03 NOTE — Telephone Encounter (Signed)
Patient called, she has questions about medication dosage. Please call

## 2021-02-04 ENCOUNTER — Telehealth: Payer: Self-pay

## 2021-02-04 NOTE — Telephone Encounter (Signed)
Attempted to call patient.  No answer and no answering machine picked up.

## 2021-02-09 ENCOUNTER — Ambulatory Visit: Payer: Medicare Other | Admitting: Physician Assistant

## 2021-02-17 ENCOUNTER — Ambulatory Visit: Payer: Medicare Other | Admitting: Physician Assistant

## 2021-02-17 ENCOUNTER — Encounter: Payer: Self-pay | Admitting: Physician Assistant

## 2021-02-17 ENCOUNTER — Ambulatory Visit (INDEPENDENT_AMBULATORY_CARE_PROVIDER_SITE_OTHER): Payer: Medicare Other | Admitting: Physician Assistant

## 2021-02-17 VITALS — BP 118/70 | HR 79 | Ht 64.5 in | Wt 184.4 lb

## 2021-02-17 DIAGNOSIS — B182 Chronic viral hepatitis C: Secondary | ICD-10-CM

## 2021-02-17 DIAGNOSIS — Z800 Family history of malignant neoplasm of digestive organs: Secondary | ICD-10-CM

## 2021-02-17 DIAGNOSIS — R1319 Other dysphagia: Secondary | ICD-10-CM

## 2021-02-17 DIAGNOSIS — K5903 Drug induced constipation: Secondary | ICD-10-CM | POA: Diagnosis not present

## 2021-02-17 DIAGNOSIS — K219 Gastro-esophageal reflux disease without esophagitis: Secondary | ICD-10-CM

## 2021-02-17 MED ORDER — PLENVU 140 G PO SOLR: 1.0000 | ORAL | 0 refills | Status: DC

## 2021-02-17 NOTE — Progress Notes (Signed)
02/17/2021 Rachael West [id number] 12-17-1955   ASSESSMENT AND PLAN:    Gastroesophageal reflux disease, unspecified whether esophagitis present she reports symptoms is not currently well controlled, and denies breakthrough reflux, burning in chest, hoarseness or cough. Lifestyle changes discussed, avoid NSAIDS- try to stop mobic Continue current medications Negative gastric emptying  Will repeat EGD with nocturnal symptoms, NSAID use Will schedule EGD to evaluate for possible H. pylori, eosinophilic esophagitis, peptic ulcer disease, or strictures. I discussed risks of EGD with patient today, including risk of sedation, bleeding or perforation.  Patient provides understanding and gave verbal consent to proceed.  Other dysphagia May benefit from [facility], will repeat EGD with colon with history of mobic use and nocturnal symtoms  Drug-induced constipation Continue amitiza, miralax, and benefiber  Family history of colon cancer Due for repeat Will need 2 day prep with constipation We have discussed the risks of bleeding, infection, perforation, medication reactions, a 10-20% miss rate for small colon cancer or polyp and remote risk of death associated with colonoscopy. All questions were answered and the patient acknowledges these risk and wishes to proceed.  Chronic hepatitis C without hepatic coma (HCC) AB [place] elastography with fibrosis 3/4, CT AB 2021 without cirrhosis Platelets 221 01/27/21 AST 23 ALT 14  Treated harvoni, has had weight gain Will repeat AB [place] elastography- possible hepatomegaly on exam.   Abnormal CT Lung Discussed with patient, follow up with PCP if needs CT chest.  Future Appointments  Date Time Provider [place]  05/26/2021  8:30 AM Lorrene Reid, PA-C [facility] None  06/27/2021 10:40 AM Milinda Pointer, MD [facility] None    History of Present Illness:  66 y.o. female presents for follow-up of reflux and colonoscopy. She has  medical history significant for large B-cell lymphoma s/p radiation 2016, chronic hepatitis C treated with Harvoni, GERD, chronic idiopathic constipation, hyperlipidemia, history of thyroid disease, chronic pain with long-term opiate use.  Cirrhosis is on patient's problem list however last CT AB and pelvis 07/2019 showed no evidence of cirrhosis, elastography 2016 showed F3/F4 fibrosis.   She states she has DOE with exertion, denies cough, wheezing.  Will sweat a lot day and night. No chest pain.  Patient is not on a blood thinner use.  Patient does complain of GERD, was on zegrid, now on omeprazole. Continues to have GERD, has had to go to ER for the pain, better with GI cocktail. Cold drinks help decrease the burning.  She can have nocturnal symptoms, if she talks too much she can have SOB.  No goody powders, no ETOH.  She is on Mobic 15 mg once a day x 2006. She was changed to omeprazole once a day. Patient denies nausea, vomiting, melena.    Patient does not have dentures now, she has had her topped teeth pulled Nov 29 2020 so now she is unable to wear them. Has been having soft foods. Had to have [place] on Christmas day. She has some upper phranygeal symptoms, feels it just sits in her throat, will have some feeling that the food "goes down the wrong pipe", no issues with liquids.   She has constipation secondary to tramadol. She is on [place], miralax, benefiber. She still can have 1-2 weeks without constipatoin. Last BM was last Sunday, can be hard/straining.   Patient denies diarrhea, hematochezia.  Denies changes in appetite, unintentional weight loss.  She has some AB bloating and weight is up, some minor lower leg swelling when she drinks soda, no  AB swelling, no yellowing skin/eye, no easy bruising.   Patient has  family history of colon cancer or other gastrointestinal malignancies. Maternal aunt and paternal aunt x 2 with colon cancer, 1's, liver cancer in family members with  ETOH.   External labs and notes reviewed this visit: HGB 12.7 Platelets 221- no anemia 01/27/21 AST 23 ALT 14  Alkphos 102 TBili 0.4 GFR >60   CT AB and pelvis 07/2019 No suspicious lymphadenopathy to suggest recurrent lymphoma. Spleen is normal in size. 13 mm ground-glass nodule with cystic lucencies in the left lower lobe, possibly minimally progressive. In this patient on annual surveillance, attention on follow-up is suggested. She does not have follow up.   Gastric emptying study normal 01/01/2019  12/18/2018 EGD with Dr. Loletha Carrow for dysphagia/GERD - Normal esophagus. - Gastritis. Biopsied. - Normal examined duodenum. Poor dentition likely to be contributing to dysphagia. Negative H pylori  01/17/2011 Colonoscopy Internal hemorrhoids, recall 10 years.   07/21/2014 AB [place] with elastography 1. Hepatic steatosis. Median hepatic shear wave velocity is calculated at 2.87 m/sec. Corresponding Metavir fibrosis score is F 3/F 4. Risk of fibrosis is high.  Medical History:  Past Medical History:  Diagnosis Date   Anemia    Anxiety    Arthritis    Asthma    Depression    GERD (gastroesophageal reflux disease)    Hepatitis-C    History of chemotherapy    Hypertension    Insomnia    Lymphoma ([place]) 12/2013   aggressive lymphoma   Personal history of non-Hodgkin lymphomas    Vitamin D deficiency    Allergies:  Allergies  Allergen Reactions   Latex Hives   Penicillins Hives   Codeine Anxiety   Cortisone Swelling   Sulfa Antibiotics      Current Medications, Allergies, Past Medical History, Past Surgical History, Family History and Social History were reviewed in [employer] record.  Physical Exam: BP 118/70    Pulse 79    Ht 5' 4.5" (1.638 m)    Wt 184 lb 6 oz (83.6 kg)    BMI 31.16 kg/m  General:   Pleasant, well developed female in no acute distress Eyes: sclerae anicteric,conjunctive pink  Heart:  regular rate and rhythm, no murmurs or  gallops Pulm: Clear anteriorly; no wheezing Abdomen:  Soft, Obese AB, skin exam normal, Normal bowel sounds. mild tenderness in the epigastrium. Without guarding and Without rebound, hepatomegaly noted. Extremities:  Without edema. Peripheral pulses intact.  Neurologic:  Alert and  oriented x4;  grossly normal neurologically. Skin:   Dry and intact without significant lesions or rashes. Psychiatric: Demonstrates good judgement and reason without abnormal affect or behaviors.  Vladimir Crofts, PA-C 02/17/21

## 2021-02-17 NOTE — Progress Notes (Signed)
____________________________________________________________  Attending physician addendum:  Thank you for sending this case to me and discussing it together today. I have reviewed the entire note and agree with the plan.  I recall this patient and have reviewed my prior notes about her.  Endoscopic work-up appropriate, I still think there is a significant upper and lower motility problem.  Wilfrid Lund, MD  ____________________________________________________________

## 2021-02-17 NOTE — Patient Instructions (Addendum)
COLONOSCOPY AND ENDOSCOPY:   You have been scheduled for an endoscopy and a colonoscopy. Please follow the written instructions given to you at your visit today.  PREP:   Please pick up your prep supplies at the pharmacy within the next 1-3 days.  INHALERS:   If you use inhalers (even only as needed), please bring them with you on the day of your procedure.  MEDICATIONS TO HOLD:  We will contact your provider to request permission for you to hold . Once we receive a response, you will be contacted by our office. If you do not hear from our office 1 week prior to your scheduled procedure, please contact our office at [phone].   COLONOSCOPY TIPS:  To reduce nausea and dehydration, stay well hydrated for 3-4 days prior to the exam.  To prevent skin/hemorrhoid irritation - prior to wiping, put A&Dointment or vaseline on the toilet paper. Keep a towel or pad on the bed.  BEFORE STARTING YOUR PREP, drink  64oz of clear liquids in the morning. This will help to flush the colon and will ensure you are well hydrated!!!!  NOTE - This is in addition to the fluids required for to complete your prep. Use of a flavored hard candy, such as grape Anise Salvo, can counteract some of the flavor of the prep and may prevent some nausea.   PLEASE FOLLOW UP WITH YOUR PCP OR DR. Lindi Adie ABOUT PREVIOUS CT AB CHEST, MAY NEED REPEAT.   Please STOP your mobic, work with your doctor to do this.  You can take tylenol (500mg ) or tylenol arthritis (650mg ) with the meloxicam/antiinflammatories. The max you can take of tylenol a day is 3000mg  daily, this is a max of 6 pills a day of the regular tyelnol (500mg ) or a max of 4 a day of the tylenol arthritis (650mg ) as long as no other medications you are taking contain tylenol.   this can cause inflammation in your stomach and can cause ulcers or bleeding, this will look like black tarry stools Make sure you taking it with food Try not to take it daily, take  AS needed Can try voltern gel topical Talk with pain doctor  Recommend starting on a fiber supplement, can try metamucil first but if this causes gas/bloating switch to benefiber- Take with with a full 8 oz glass of water once a day. This can take 1 month to start helping, so try for at least one month.  Recommend increasing water and activity.  Get a squatty potty to use at home or try a stool, goal is to get your knees above your hips during a bowel movement.  Go to the ER if unable to pass gas, severe AB pain, unable to hold down food, any shortness of breath of chest pain.  Please take your proton pump inhibitor medication 30 minutes to 1 hour before meals- this makes it more effective.  Avoid spicy and acidic foods Avoid fatty foods Limit your intake of coffee, tea, alcohol, and carbonated drinks Work to maintain a healthy weight Keep the head of the bed elevated at least 3 inches with blocks or a wedge pillow if you are having any nighttime symptoms Stay upright for 2 hours after eating Avoid meals and snacks three to four hours before bedtime Stop smoking  Gastroesophageal Reflux Disease, Adult Gastroesophageal reflux (GER) happens when acid from the stomach flows up into the tube that connects the mouth and the stomach (esophagus). Normally, food travels down  the esophagus and stays in the stomach to be digested. However, when a person has GER, food and stomach acid sometimes move back up into the esophagus. If this becomes a more serious problem, the person may be diagnosed with a disease called gastroesophageal reflux disease (GERD). GERD occurs when the reflux: Happens often. Causes frequent or severe symptoms. Causes problems such as damage to the esophagus. When stomach acid comes in contact with the esophagus, the acid may cause inflammation in the esophagus. Over time, GERD may create small holes (ulcers) in the lining of the esophagus. What are the causes? This condition is  caused by a problem with the muscle between the esophagus and the stomach (lower esophageal sphincter, or LES). Normally, the LES muscle closes after food passes through the esophagus to the stomach. When the LES is weakened or abnormal, it does not close properly, and that allows food and stomach acid to go back up into the esophagus. The LES can be weakened by certain dietary substances, medicines, and medical conditions, including: Tobacco use. Pregnancy. Having a hiatal hernia. Alcohol use. Certain foods and beverages, such as coffee, chocolate, onions, and peppermint. What increases the risk? You are more likely to develop this condition if you: Have an increased body weight. Have a connective tissue disorder. Take NSAIDs, such as ibuprofen. What are the signs or symptoms? Symptoms of this condition include: Heartburn. Difficult or painful swallowing and the feeling of having a lump in the throat. A bitter taste in the mouth. Bad breath and having a large amount of saliva. Having an upset or bloated stomach and belching. Chest pain. Different conditions can cause chest pain. Make sure you see your health care provider if you experience chest pain. Shortness of breath or wheezing. Ongoing (chronic) cough or a nighttime cough. Wearing away of tooth enamel. Weight loss. How is this diagnosed? This condition may be diagnosed based on a medical history and a physical exam. To determine if you have mild or severe GERD, your health care provider may also monitor how you respond to treatment. You may also have tests, including: A test to examine your stomach and esophagus with a small camera (endoscopy). A test that measures the acidity level in your esophagus. A test that measures how much pressure is on your esophagus. A barium swallow or modified barium swallow test to show the shape, size, and functioning of your esophagus. How is this treated? Treatment for this condition may vary  depending on how severe your symptoms are. Your health care provider may recommend: Changes to your diet. Medicine. Surgery. The goal of treatment is to help relieve your symptoms and to prevent complications. Follow these instructions at home: Eating and drinking  Follow a diet as recommended by your health care provider. This may involve avoiding foods and drinks such as: Coffee and tea, with or without caffeine. Drinks that contain alcohol. Energy drinks and sports drinks. Carbonated drinks or sodas. Chocolate and cocoa. Peppermint and mint flavorings. Garlic and onions. Horseradish. Spicy and acidic foods, including peppers, chili powder, curry powder, vinegar, hot sauces, and barbecue sauce. Citrus fruit juices and citrus fruits, such as oranges, lemons, and limes. Tomato-based foods, such as red sauce, chili, salsa, and pizza with red sauce. Fried and fatty foods, such as donuts, french fries, potato chips, and high-fat dressings. High-fat meats, such as hot dogs and fatty cuts of red and white meats, such as rib eye steak, sausage, ham, and bacon. High-fat dairy items, such as whole  milk, butter, and cream cheese. Eat small, frequent meals instead of large meals. Avoid drinking large amounts of liquid with your meals. Avoid eating meals during the 2-3 hours before bedtime. Avoid lying down right after you eat. Do not exercise right after you eat. Lifestyle  Do not use any products that contain nicotine or tobacco. These products include cigarettes, chewing tobacco, and vaping devices, such as e-cigarettes. If you need help quitting, ask your health care provider. Try to reduce your stress by using methods such as yoga or meditation. If you need help reducing stress, ask your health care provider. If you are overweight, reduce your weight to an amount that is healthy for you. Ask your health care provider for guidance about a safe weight loss goal. General instructions Pay  attention to any changes in your symptoms. Take over-the-counter and prescription medicines only as told by your health care provider. Do not take aspirin, ibuprofen, or other NSAIDs unless your health care provider told you to take these medicines. Wear loose-fitting clothing. Do not wear anything tight around your waist that causes pressure on your abdomen. Raise (elevate) the head of your bed about 6 inches (15 cm). You can use a wedge to do this. Avoid bending over if this makes your symptoms worse. Keep all follow-up visits. This is important. Contact a health care provider if: You have: New symptoms. Unexplained weight loss. Difficulty swallowing or it hurts to swallow. Wheezing or a persistent cough. A hoarse voice. Your symptoms do not improve with treatment. Get help right away if: You have sudden pain in your arms, neck, jaw, teeth, or back. You suddenly feel sweaty, dizzy, or light-headed. You have chest pain or shortness of breath. You vomit and the vomit is green, yellow, or black, or it looks like blood or coffee grounds. You faint. You have stool that is red, bloody, or black. You cannot swallow, drink, or eat. These symptoms may represent a serious problem that is an emergency. Do not wait to see if the symptoms will go away. Get medical help right away. Call your local emergency services (911 in the [place] Do not drive yourself to the hospital. Summary Gastroesophageal reflux happens when acid from the stomach flows up into the esophagus. GERD is a disease in which the reflux happens often, causes frequent or severe symptoms, or causes problems such as damage to the esophagus. Treatment for this condition may vary depending on how severe your symptoms are. Your health care provider may recommend diet and lifestyle changes, medicine, or surgery. Contact a health care provider if you have new or worsening symptoms. Take over-the-counter and prescription medicines only as  told by your health care provider. Do not take aspirin, ibuprofen, or other NSAIDs unless your health care provider told you to do so. Keep all follow-up visits as told by your health care provider. This is important. This information is not intended to replace advice given to you by your health care provider. Make sure you discuss any questions you have with your health care provider. Document Revised: 07/07/2019 Document Reviewed: 07/07/2019 Elsevier Patient Education  2022 [place] will be contacted by [facility] in the next 2 days to arrange a Ultrasound.  The number on your caller ID will be [phone], please answer when they call.  If you have not heard from them in 2 days please call [phone] to schedule.    I appreciate the opportunity to care for you. Vicie Mutters,  PA-C

## 2021-02-23 ENCOUNTER — Other Ambulatory Visit: Payer: Self-pay | Admitting: Physician Assistant

## 2021-02-23 DIAGNOSIS — J4520 Mild intermittent asthma, uncomplicated: Secondary | ICD-10-CM

## 2021-02-25 ENCOUNTER — Other Ambulatory Visit: Payer: Self-pay

## 2021-02-25 ENCOUNTER — Ambulatory Visit (HOSPITAL_COMMUNITY)
Admission: RE | Admit: 2021-02-25 | Discharge: 2021-02-25 | Disposition: A | Discharge status: Home / Self Care | Payer: Medicare Other | Source: Ambulatory Visit | Attending: Physician Assistant | Admitting: Physician Assistant

## 2021-02-25 DIAGNOSIS — B182 Chronic viral hepatitis C: Secondary | ICD-10-CM | POA: Diagnosis not present

## 2021-03-03 ENCOUNTER — Telehealth: Payer: Self-pay

## 2021-03-03 ENCOUNTER — Telehealth: Payer: Self-pay | Admitting: *Deleted

## 2021-03-03 DIAGNOSIS — K5903 Drug induced constipation: Secondary | ICD-10-CM

## 2021-03-03 DIAGNOSIS — Z800 Family history of malignant neoplasm of digestive organs: Secondary | ICD-10-CM

## 2021-03-03 DIAGNOSIS — K219 Gastro-esophageal reflux disease without esophagitis: Secondary | ICD-10-CM

## 2021-03-03 DIAGNOSIS — R1319 Other dysphagia: Secondary | ICD-10-CM

## 2021-03-03 MED ORDER — PEG 3350-KCL-NA BICARB-NACL 420 G PO SOLR: 4000.0000 mL | Freq: Once | ORAL | 0 refills | Status: AC

## 2021-03-03 NOTE — Telephone Encounter (Signed)
Patient arrived to [facility] today prepared for her endoscopy and colonoscopy that she thought was today.  Her paperwork and all prep instructions have the correct date of 3/23 instead of 2/23.  This charge RN spoke personally with the patient and discussed if there were any openings in today's procedure schedule we would do everything we can to perform her procedure today but unfortunately there are no openings available with any MD's today.  Patient reports the prescription prep was provided from the [facility] office.  I will follow up with the office to see if it is possible to provide an additional prep or if a prescription can be sent to pharmacy of the patients choice.  Dr. Loletha Carrow is aware of this encounter.

## 2021-03-03 NOTE — Telephone Encounter (Signed)
Per Dr Loletha Carrow, pt is to do 2 day split/Golytely prep for procedure on 03-31-21 at 9:30.  I spoke with pt and verified her pharmacy, and also that she uses MyChart.  New prep instructions sent via MyChart and Golytely prep sent to [place].   Pt encouraged to read over instructions when she receives them and call back if she has questions.

## 2021-03-04 ENCOUNTER — Other Ambulatory Visit: Payer: Self-pay | Admitting: Physician Assistant

## 2021-03-04 DIAGNOSIS — J4520 Mild intermittent asthma, uncomplicated: Secondary | ICD-10-CM

## 2021-03-17 ENCOUNTER — Ambulatory Visit: Payer: Medicare Other | Attending: Pain Medicine | Admitting: Pain Medicine

## 2021-03-17 ENCOUNTER — Encounter: Payer: Self-pay | Admitting: Pain Medicine

## 2021-03-17 ENCOUNTER — Other Ambulatory Visit: Payer: Self-pay

## 2021-03-17 ENCOUNTER — Other Ambulatory Visit: Payer: Self-pay | Admitting: Physician Assistant

## 2021-03-17 VITALS — BP 157/78 | HR 93 | Temp 97.1°F | Resp 16 | Ht 65.0 in | Wt 181.0 lb

## 2021-03-17 DIAGNOSIS — G894 Chronic pain syndrome: Secondary | ICD-10-CM | POA: Diagnosis present

## 2021-03-17 DIAGNOSIS — M50122 Cervical disc disorder at C5-C6 level with radiculopathy: Secondary | ICD-10-CM

## 2021-03-17 DIAGNOSIS — R937 Abnormal findings on diagnostic imaging of other parts of musculoskeletal system: Secondary | ICD-10-CM | POA: Diagnosis present

## 2021-03-17 DIAGNOSIS — M25511 Pain in right shoulder: Secondary | ICD-10-CM | POA: Diagnosis present

## 2021-03-17 DIAGNOSIS — G8929 Other chronic pain: Secondary | ICD-10-CM

## 2021-03-17 DIAGNOSIS — M792 Neuralgia and neuritis, unspecified: Secondary | ICD-10-CM

## 2021-03-17 DIAGNOSIS — M79601 Pain in right arm: Secondary | ICD-10-CM | POA: Insufficient documentation

## 2021-03-17 DIAGNOSIS — M4802 Spinal stenosis, cervical region: Secondary | ICD-10-CM

## 2021-03-17 DIAGNOSIS — M5412 Radiculopathy, cervical region: Secondary | ICD-10-CM | POA: Diagnosis present

## 2021-03-17 DIAGNOSIS — M542 Cervicalgia: Secondary | ICD-10-CM | POA: Diagnosis present

## 2021-03-17 DIAGNOSIS — J4520 Mild intermittent asthma, uncomplicated: Secondary | ICD-10-CM

## 2021-03-17 DIAGNOSIS — M5030 Other cervical disc degeneration, unspecified cervical region: Secondary | ICD-10-CM | POA: Diagnosis present

## 2021-03-17 NOTE — Progress Notes (Signed)
PROVIDER NOTE: Information contained herein reflects review and annotations entered in association with encounter. Interpretation of such information and data should be left to medically-trained personnel. Information provided to patient can be located elsewhere in the medical record under "Patient Instructions". Document created using STT-dictation technology, any transcriptional errors that may result from process are unintentional.    Patient: Rachael West  Service Category: E/M  Provider: Gaspar Cola, MD  DOB: 09-18-1955  DOS: 03/17/2021  Specialty: Interventional Pain Management  MRN: [id number]  Setting: Ambulatory outpatient  PCP: Lorrene Reid, PA-C  Type: Established Patient    Referring Provider: Lorrene Reid, PA-C  Location: Office  Delivery: Face-to-face     HPI  [name], a 66 y.o. year old female, is here today because of her Chronic pain of right upper extremity [M79.601, G89.29]. [name] primary complain today is Shoulder Pain (right) Last encounter: My last encounter with her was on 02/03/2021. Pertinent problems: [name] has Large B-cell lymphoma ([place]); Atypical chest pain; Chronic low back pain (Primary Area of Pain) (Bilateral) w/ sciatica (Left); Chronic lower extremity pain (2ry area of Pain) (Left); Chronic upper back pain (3ry area of Pain) (Bilateral) (L>R); Chronic neck pain (4th area of Pain) (Bilateral) (L>R); Occipital headache; Chronic knee pain (Bilateral) (R>L); Chronic pain syndrome; Chronic sacroiliac joint pain; DDD (degenerative disc disease), lumbar; Lumbar facet arthropathy; Lumbar facet syndrome (Bilateral) (L>R); Spondylosis without myelopathy or radiculopathy, lumbar region; DDD (degenerative disc disease), thoracic; Thoracolumbar dextroscoliosis; Cervicogenic headache; Tendinitis of rotator cuff (Left); Tendinitis of rotator cuff (Right); Fibromyalgia; Chronic shoulder pain (Bilateral) (R>L); Chronic hip pain  (Bilateral) (L>R); Cervicalgia; Chronic idiopathic gout; DDD (degenerative disc disease), cervical; 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); Cervical facet syndrome (Bilateral) (R>L); Spondylosis without myelopathy or radiculopathy, cervical region; Chronic musculoskeletal pain; Chronic low back pain (1ry area of Pain) (Bilateral) (L>R) w/o sciatica; Cervical disc disorder at C5-C6 level with radiculopathy (Right); Cervical radiculopathy at C6 (Right); Osteoarthrosis of multiple sites; Radicular pain of upper extremity (Right); Chronic upper extremity pain (Right); Chronic shoulder pain (Right); Arthralgia of shoulder region (Right); Shoulder enthesopathy (Right); Musculoskeletal disorder involving upper trapezius muscle (Right); Abnormal MRI, cervical spine (11/12/2019); Chronic neuropathic pain; Disturbance of skin sensation; Hyperalgesia; Trigger finger, middle finger (Right); and Trigger point of shoulder region (Right) on their pertinent problem list. Pain Assessment: Severity of Chronic pain is reported as a 7 /10. Location: Shoulder Right/right side of neck, right arm to the hand. Onset: More than a month ago. Quality: Shooting. Timing: Constant. Modifying factor(s): medications. Vitals:  height is 5' 5" (1.651 m) and weight is 181 lb (82.1 kg). Her temporal temperature is 97.1 F (36.2 C) (abnormal). Her blood pressure is 157/78 (abnormal) and her pulse is 93. Her respiration is 16 and oxygen saturation is 100%.   Reason for encounter: evaluation of worsening, or previously known (established) problem.  The patient returns to the clinic today indicating that she is having quite a bit of pain going down the right upper extremity all the way down into her fingers.  She refers having pain, numbness, and weakness that seems to be affecting the C6 and C7 dermatomal distribution.  On 11/12/2019 the patient had an MRI of the cervical spine done which demonstrated facet joint disease as well as a  right foraminal impingement at C3-4, C5-6, and C6-7 levels.  There is also some degree of left foraminal impingement at the C5-6 and C6-7 levels.  This can explain some of the patient's  current symptoms.  Based on the patient's complaints, clinical findings, and the results of the cervical MRI, today we have offered the patient to schedule her for a right-sided cervical epidural steroid injection under fluoroscopic guidance.  The last time that she had this done was on 05/04/2020, when she attained 100% relief of the pain for the duration of the local anesthetic followed by a 95% improvement that continued until recently when the pain started coming back.  My assessment and plan were shared with the patient who understood and accepted.  Pharmacotherapy Assessment  Analgesic: Tramadol 50 mg, 2 tabs (100 mg) PO q 6 hrs (400 mg/day of tramadol) MME/day: 40 mg/day.   Monitoring: [place] PMP: PDMP reviewed during this encounter.       Pharmacotherapy: No side-effects or adverse reactions reported. Compliance: No problems identified. Effectiveness: Clinically acceptable.  Hart Rochester, RN  03/17/2021  2:53 PM  Sign when Signing Visit 1453 Instructions given for IV sedation..    UDS:  Summary  Date Value Ref Range Status  07/26/2020 Note  Final    Comment:    ==================================================================== [employer] Select 13 (MW) ==================================================================== Test                             Result       Flag       Units  Drug Present and Declared for Prescription Verification   Tramadol                       >6667        EXPECTED   ng/mg creat   O-Desmethyltramadol            >6667        EXPECTED   ng/mg creat   N-Desmethyltramadol            >6667        EXPECTED   ng/mg creat    Source of tramadol is a prescription medication. O-desmethyltramadol    and N-desmethyltramadol are expected metabolites of  tramadol.  ==================================================================== Test                      Result    Flag   Units      Ref Range   Creatinine              75               mg/dL      >=20 ==================================================================== Declared Medications:  The flagging and interpretation on this report are based on the  following declared medications.  Unexpected results may arise from  inaccuracies in the declared medications.   **Note: The testing scope of this panel includes these medications:   Tramadol   **Note: The testing scope of this panel does not include the  following reported medications:   Albuterol  Atenolol  Buspirone  Dimenhydrinate  Fluticasone (Advair)  Gabapentin  Hydroxyzine  Ipratropium  Latanoprost  Lisinopril  Loratadine  Lubiprostone  Magnesium  Meloxicam  Mirabegron  Montelukast  Omeprazole  Ondansetron  Paliperidone  Polyethylene Glycol (MiraLAX)  Prazosin  Salmeterol (Advair)  Sertraline  Trazodone ==================================================================== For clinical consultation, please call [phone]. ====================================================================      ROS  Constitutional: Denies any fever or chills Gastrointestinal: No reported hemesis, hematochezia, vomiting, or acute GI distress Musculoskeletal: Denies any acute onset joint swelling, redness, loss of ROM, or weakness  Neurological: No reported episodes of acute onset apraxia, aphasia, dysarthria, agnosia, amnesia, paralysis, loss of coordination, or loss of consciousness  Medication Review  Benefiber, Fluticasone-Salmeterol, Magnesium, PEG-KCl-NaCl-NaSulf-Na Asc-C, albuterol, atenolol, b complex vitamins, busPIRone, dimenhyDRINATE, gabapentin, hydrOXYzine, ipratropium-albuterol, latanoprost, lisinopril, loratadine, lubiprostone, meloxicam, mirabegron ER, montelukast, omeprazole, ondansetron, paliperidone,  polyethylene glycol, prazosin, sertraline, traMADol, and traZODone  History Review  Allergy: [name] is allergic to latex, penicillins, codeine, cortisone, and sulfa antibiotics. Drug: [name]  reports no history of drug use. Alcohol:  reports no history of alcohol use. Tobacco:  reports that she quit smoking about 31 years ago. Her smoking use included cigarettes. She has a 30.00 pack-year smoking history. She has never used smokeless tobacco. Social: [name]  reports that she quit smoking about 31 years ago. Her smoking use included cigarettes. She has a 30.00 pack-year smoking history. She has never used smokeless tobacco. She reports that she does not drink alcohol and does not use drugs. Medical:  has a past medical history of Anemia, Anxiety, Arthritis, Asthma, Depression, GERD (gastroesophageal reflux disease), Hepatitis-C, History of chemotherapy, Hypertension, Insomnia, Lymphoma ([place]) (12/2013), Personal history of non-Hodgkin lymphomas, and Vitamin D deficiency. Surgical: Ms. Friddle  has a past surgical history that includes Appendectomy; Trigger finger release; Esophagogastroduodenoscopy (01/17/2011); Colonoscopy (01/17/2011); Abdominal hysterectomy; and Breast biopsy (Left, 2015). Family: family history includes Allergies in her mother; Asthma in her mother and sister; Breast cancer in her cousin; Colon cancer in her paternal aunt; Diabetes in her maternal grandfather and maternal grandmother; Heart disease in her brother, father, paternal grandfather, and paternal grandmother; Liver cancer in her paternal uncle; Ovarian cancer in an other family member; Pancreatic cancer in an other family member.  Laboratory Chemistry Profile   Renal Lab Results  Component Value Date   BUN 6 (L) 01/27/2021   CREATININE 0.71 01/27/2021   BCR 8 (L) 01/27/2021   GFRAA >60 08/05/2019   GFRNONAA >60 08/12/2020    Hepatic Lab Results  Component Value Date   AST 23  01/27/2021   ALT 14 01/27/2021   ALBUMIN 4.3 01/27/2021   ALKPHOS 102 01/27/2021   HCVAB REACTIVE (A) 01/12/2014   LIPASE 34 10/31/2010    Electrolytes Lab Results  Component Value Date   NA 141 01/27/2021   K 4.4 01/27/2021   CL 102 01/27/2021   CALCIUM 10.1 01/27/2021   MG 2.2 08/29/2017    Bone Lab Results  Component Value Date   25OHVITD1 21 (L) 08/29/2017   25OHVITD2 <1.0 08/29/2017   25OHVITD3 21 08/29/2017    Inflammation (CRP: Acute Phase) (ESR: Chronic Phase) Lab Results  Component Value Date   CRP 12 (H) 08/29/2017   ESRSEDRATE 46 (H) 08/29/2017         Note: Above Lab results reviewed.  Recent Imaging Review  [place] ELASTOGRAPHY LIVER CLINICAL DATA:  Chronic hepatitis C.  EXAM: US LIVER ELASTOGRAPHY  TECHNIQUE: Sonography of the liver was performed. In addition, ultrasound elastography evaluation of the liver was performed. A region of interest was placed within the right lobe of the liver. Following application of a compressive sonographic pulse, tissue compressibility was assessed. Multiple assessments were performed at the selected site. Median tissue compressibility was determined. Previously, hepatic stiffness was assessed by shear wave velocity. Based on recently published Society of Radiologists in Ultrasound consensus article, reporting is now recommended to be performed in the SI units of pressure (kiloPascals) representing hepatic stiffness/elasticity. The obtained result is compared to the published reference standards. (cACLD = compensated Advanced  Chronic Liver Disease)  COMPARISON:  CT February 02, 2015 and ultrasound July 21, 2014  FINDINGS: Liver: No focal lesion identified. Diffusely increased parenchymal echogenicity. Portal vein is patent on color Doppler imaging with normal direction of blood flow towards the liver.  ULTRASOUND HEPATIC ELASTOGRAPHY  Device: Siemens Helix VTQ  Patient position: Supine  Transducer:  5C1  Number of measurements: 10  Hepatic segment:  8  Median kPa: 3.7  IQR: 0.5  IQR/Median kPa ratio: 0.1  Data quality:  Good  Diagnostic category:  < or = 5 kPa: high probability of being normal  The use of hepatic elastography is applicable to patients with viral hepatitis and non-alcoholic fatty liver disease. At this time, there is insufficient data for the referenced cut-off values and use in other causes of liver disease, including alcoholic liver disease. Patients, however, may be assessed by elastography and serve as their own reference standard/baseline.  In patients with non-alcoholic liver disease, the values suggesting compensated advanced chronic liver disease (cACLD) may be lower, and patients may need additional testing with elasticity results of 7-9 kPa.  Please note that abnormal hepatic elasticity and shear wave velocities may also be identified in clinical settings other than with hepatic fibrosis, such as: acute hepatitis, elevated right heart and central venous pressures including use of beta blockers, veno-occlusive disease (Budd-Chiari), infiltrative processes such as mastocytosis/amyloidosis/infiltrative tumor/lymphoma, extrahepatic cholestasis, with hyperemia in the post-prandial state, and with liver transplantation. Correlation with patient history, laboratory data, and clinical condition recommended.  Diagnostic Categories:  < or =5 kPa: high probability of being normal  < or =9 kPa: in the absence of other known clinical signs, rules out cACLD  >9 kPa and ?13 kPa: suggestive of cACLD, but needs further testing  >13 kPa: highly suggestive of cACLD  > or =17 kPa: highly suggestive of cACLD with an increased probability of clinically significant portal hypertension  IMPRESSION: ULTRASOUND LIVER:  Diffusely increased hepatic parenchymal echogenicity nonspecific but most commonly reflecting hepatic steatosis. No overt hepatic  lesion identified.  ULTRASOUND HEPATIC ELASTOGRAPHY:  Median kPa:  3.7  Diagnostic category:  < or = 5 kPa: high probability of being normal  Electronically Signed   By: Dahlia Bailiff M.D.   On: 02/25/2021 13:13 Note: Reviewed        Physical Exam  General appearance: Well nourished, well developed, and well hydrated. In no apparent acute distress Mental status: Alert, oriented x 3 (person, place, & time)       Respiratory: No evidence of acute respiratory distress Eyes: PERLA Vitals: BP (!) 157/78    Pulse 93    Temp (!) 97.1 F (36.2 C) (Temporal)    Resp 16    Ht 5' 5" (1.651 m)    Wt 181 lb (82.1 kg)    SpO2 100%    BMI 30.12 kg/m  BMI: Estimated body mass index is 30.12 kg/m as calculated from the following:   Height as of this encounter: 5' 5" (1.651 m).   Weight as of this encounter: 181 lb (82.1 kg). Ideal: Ideal body weight: 57 kg (125 lb 10.6 oz) Adjusted ideal body weight: 67 kg (147 lb 12.8 oz)  Assessment   Status Diagnosis  Controlled Controlled Controlled 1. Chronic upper extremity pain (Right)   2. Chronic shoulder pain (Right)   3. Radicular pain of upper extremity (Right)   4. Cervical radiculopathy at C6 (Right)   5. Cervical disc disorder at C5-C6 level with radiculopathy (Right)   6. 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)   7. Cervicalgia   8. DDD (degenerative disc disease), cervical   9. Abnormal MRI, cervical spine (11/12/2019)   10. Chronic pain syndrome      Updated Problems: No problems updated.  Plan of Care  Problem-specific:  No problem-specific Assessment & Plan notes found for this encounter.  [name] has a current medication list which includes the following long-term medication(s): albuterol, atenolol, fluticasone-salmeterol, ipratropium-albuterol, lisinopril, loratadine, lubiprostone, meloxicam, mirabegron er, montelukast, omeprazole, paliperidone, prazosin, sertraline, tramadol, trazodone,  gabapentin, magnesium, and benefiber.  Pharmacotherapy (Medications Ordered): No orders of the defined types were placed in this encounter.  Orders:  Orders Placed This Encounter  Procedures   Cervical Epidural Injection    Sedation: Patient's choice. Laterality: Right Purpose: Therapeutic Indication(s): Radiculitis and cervicalgia associater with cervical degenerative disc disease.    Standing Status:   Future    Standing Expiration Date:   06/17/2021    Scheduling Instructions:     Procedure: Cervical Epidural Steroid Injection/Block     Level(s): C7-T1     Laterality: Right-sided     Timeframe: As soon as schedule allows    Order Specific Question:   Where will this procedure be performed?    Answer:   [facility]    Comments:   by Dr. Dossie Arbour   Follow-up plan:   Return for [facility]) procedure: (R) CESI.     Interventional Therapies  Risk   Complexity Considerations:   Estimated body mass index is 31.77 kg/m as calculated from the following:   Height as of this encounter: 5' 4.5" (1.638 m).   Weight as of this encounter: 188 lb (85.3 kg). NOTE: LATEX ALLERGY & Hx Hepatitis C (w/ cirrhosis)   Planned   Pending:   Therapeutic right cervical ESI #3    Under consideration:   Therapeutic right cervical ESI #3  Therapeutic right Racz procedure #1  Diagnostic right suprascapular NB #1  Therapeutic bilateral lumbar facet RFA #1 (starting on left side)  Therapeutic bilateral cervical facet RFA #1 (starting on left side)    Completed:   Diagnostic right glenohumeral/AC  inj. x3 (09/23/2020) (100/100/80/80) Diagnostic bilateral lumbar facet MBB x3 (06/03/2019) (100/100/90/90) Diagnostic bilateral cervical facet MBB x1 (11/06/2017) (100/100/90) Diagnostic left caudal ESI x1 (08/14/2019) (100/100/100 x8 days/90-100) Diagnostic right cervical ESI x2 (05/04/2020) (100/100/95/95)   Therapeutic   Palliative (PRN) options:   Therapeutic/palliative glenohumeral/AC  inj.    Therapeutic/palliative lumbar facet MBB   Therapeutic/palliative cervical facet MBB   Therapeutic/palliative caudal ESI   Therapeutic/palliative cervical ESI     Recent Visits No visits were found meeting these conditions. Showing recent visits within past 90 days and meeting all other requirements Today's Visits Date Type Provider Dept  03/17/21 Office Visit Milinda Pointer, MD [facility]  Showing today's visits and meeting all other requirements Future Appointments No visits were found meeting these conditions. Showing future appointments within next 90 days and meeting all other requirements  I discussed the assessment and treatment plan with the patient. The patient was provided an opportunity to ask questions and all were answered. The patient agreed with the plan and demonstrated an understanding of the instructions.  Patient advised to call back or seek an in-person evaluation if the symptoms or condition worsens.  Duration of encounter: 30 minutes.  Note by: Gaspar Cola, MD Date: 03/17/2021; Time: 4:31 PM

## 2021-03-17 NOTE — Progress Notes (Signed)
1453 Instructions given for IV sedation.Marland Kitchen ?

## 2021-03-17 NOTE — Patient Instructions (Addendum)
______________________________________________________________________ ° °Preparing for Procedure with Sedation ° °NOTICE: Due to recent regulatory changes, starting on August 09, 2020, procedures requiring intravenous (IV) sedation will no longer be performed at the [facility].  These types of procedures are required to be performed at [facility] ambulatory surgery facility.  We are very sorry for the inconvenience. ° °Procedure appointments are limited to planned procedures: °No Prescription Refills. °No disability issues will be discussed. °No medication changes will be discussed. ° °Instructions: °Oral Intake: Do not eat or drink anything for at least 8 hours prior to your procedure. (Exception: Blood Pressure Medication. See below.) °Transportation: A driver is required. You may not drive yourself after the procedure. °Blood Pressure Medicine: Do not forget to take your blood pressure medicine with a sip of water the morning of the procedure. If your Diastolic (lower reading) is above 100 mmHg, elective cases will be cancelled/rescheduled. °Blood thinners: These will need to be stopped for procedures. Notify our staff if you are taking any blood thinners. Depending on which one you take, there will be specific instructions on how and when to stop it. °Diabetics on insulin: Notify the staff so that you can be scheduled 1st case in the morning. If your diabetes requires high dose insulin, take only ½ of your normal insulin dose the morning of the procedure and notify the staff that you have done so. °Preventing infections: Shower with an antibacterial soap the morning of your procedure. °Build-up your immune system: Take 1000 mg of Vitamin C with every meal (3 times a day) the day prior to your procedure. °Antibiotics: Inform the staff if you have a condition or reason that requires you to take antibiotics before dental procedures. °Pregnancy: If you are pregnant, call and cancel the procedure. °Sickness: If  you have a cold, fever, or any active infections, call and cancel the procedure. °Arrival: You must be in the facility at least 30 minutes prior to your scheduled procedure. °Children: Do not bring children with you. °Dress appropriately: Bring dark clothing that you would not mind if they get stained. °Valuables: Do not bring any jewelry or valuables. ° °Reasons to call and reschedule or cancel your procedure: (Following these recommendations will minimize the risk of a serious complication.) °Surgeries: Avoid having procedures within 2 weeks of any surgery. (Avoid for 2 weeks before or after any surgery). °Flu Shots: Avoid having procedures within 2 weeks of a flu shots. (Avoid for 2 weeks before or after immunizations). °Barium: Avoid having a procedure within 7-10 days after having had a radiological study involving the use of radiological contrast. (Myelograms, Barium swallow or enema study). °Heart attacks: Avoid any elective procedures or surgeries for the initial 6 months after a "Myocardial Infarction" (Heart Attack). °Blood thinners: It is imperative that you stop these medications before procedures. Let us know if you if you take any blood thinner.  °Infection: Avoid procedures during or within two weeks of an infection (including chest colds or gastrointestinal problems). Symptoms associated with infections include: Localized redness, fever, chills, night sweats or profuse sweating, burning sensation when voiding, cough, congestion, stuffiness, runny nose, sore throat, diarrhea, nausea, vomiting, cold or Flu symptoms, recent or current infections. It is specially important if the infection is over the area that we intend to treat. °Heart and lung problems: Symptoms that may suggest an active cardiopulmonary problem include: cough, chest pain, breathing difficulties or shortness of breath, dizziness, ankle swelling, uncontrolled high or unusually low blood pressure, and/or palpitations. If you are    experiencing any of these symptoms, cancel your procedure and contact your primary care physician for an evaluation. ° °Remember:  °Regular Business hours are:  °Monday to Thursday 8:00 AM to 4:00 PM ° °Provider's Schedule: °Francisco Naveira, MD:  °Procedure days: Tuesday and Thursday 7:30 AM to 4:00 PM ° °Bilal Lateef, MD:  °Procedure days: Monday and Wednesday 7:30 AM to 4:00 PM °______________________________________________________________________ ° ____________________________________________________________________________________________ ° °General Risks and Possible Complications ° °Patient Responsibilities: It is important that you read this as it is part of your informed consent. It is our duty to inform you of the risks and possible complications associated with treatments offered to you. It is your responsibility as a patient to read this and to ask questions about anything that is not clear or that you believe was not covered in this document. ° °Patient’s Rights: You have the right to refuse treatment. You also have the right to change your mind, even after initially having agreed to have the treatment done. However, under this last option, if you wait until the last second to change your mind, you may be charged for the materials used up to that point. ° °Introduction: Medicine is not an exact science. Everything in Medicine, including the lack of treatment(s), carries the potential for danger, harm, or loss (which is by definition: Risk). In Medicine, a complication is a secondary problem, condition, or disease that can aggravate an already existing one. All treatments carry the risk of possible complications. The fact that a side effects or complications occurs, does not imply that the treatment was conducted incorrectly. It must be clearly understood that these can happen even when everything is done following the highest safety standards. ° °No treatment: You can choose not to proceed with the  proposed treatment alternative. The “PRO(s)” would include: avoiding the risk of complications associated with the therapy. The “CON(s)” would include: not getting any of the treatment benefits. These benefits fall under one of three categories: diagnostic; therapeutic; and/or palliative. Diagnostic benefits include: getting information which can ultimately lead to improvement of the disease or symptom(s). Therapeutic benefits are those associated with the successful treatment of the disease. Finally, palliative benefits are those related to the decrease of the primary symptoms, without necessarily curing the condition (example: decreasing the pain from a flare-up of a chronic condition, such as incurable terminal cancer). ° °General Risks and Complications: These are associated to most interventional treatments. They can occur alone, or in combination. They fall under one of the following six (6) categories: no benefit or worsening of symptoms; bleeding; infection; nerve damage; allergic reactions; and/or death. °No benefits or worsening of symptoms: In Medicine there are no guarantees, only probabilities. No healthcare provider can ever guarantee that a medical treatment will work, they can only state the probability that it may. Furthermore, there is always the possibility that the condition may worsen, either directly, or indirectly, as a consequence of the treatment. °Bleeding: This is more common if the patient is taking a blood thinner, either prescription or over the counter (example: Goody Powders, Fish oil, Aspirin, Garlic, etc.), or if suffering a condition associated with impaired coagulation (example: Hemophilia, cirrhosis of the liver, low platelet counts, etc.). However, even if you do not have one on these, it can still happen. If you have any of these conditions, or take one of these drugs, make sure to notify your treating physician. °Infection: This is more common in patients with a compromised  immune system, either due to disease (example:   diabetes, cancer, human immunodeficiency virus [HIV], etc.), or due to medications or treatments (example: therapies used to treat cancer and rheumatological diseases). However, even if you do not have one on these, it can still happen. If you have any of these conditions, or take one of these drugs, make sure to notify your treating physician. Nerve Damage: This is more common when the treatment is an invasive one, but it can also happen with the use of medications, such as those used in the treatment of cancer. The damage can occur to small secondary nerves, or to large primary ones, such as those in the spinal cord and brain. This damage may be temporary or permanent and it may lead to impairments that can range from temporary numbness to permanent paralysis and/or brain death. Allergic Reactions: Any time a substance or material comes in contact with our body, there is the possibility of an allergic reaction. These can range from a mild skin rash (contact dermatitis) to a severe systemic reaction (anaphylactic reaction), which can result in death. Death: In general, any medical intervention can result in death, most of the time due to an unforeseen complication. ____________________________________________________________________________________________ GENERAL RISKS AND COMPLICATIONS  What are the risk, side effects and possible complications? Generally speaking, most procedures are safe.  However, with any procedure there are risks, side effects, and the possibility of complications.  The risks and complications are dependent upon the sites that are lesioned, or the type of nerve block to be performed.  The closer the procedure is to the spine, the more serious the risks are.  Great care is taken when placing the radio frequency needles, block needles or lesioning probes, but sometimes complications can occur. Infection: Any time there is an injection  through the skin, there is a risk of infection.  This is why sterile conditions are used for these blocks.  There are four possible types of infection. Localized skin infection. Central Nervous System Infection-This can be in the form of Meningitis, which can be deadly. Epidural Infections-This can be in the form of an epidural abscess, which can cause pressure inside of the spine, causing compression of the spinal cord with subsequent paralysis. This would require an emergency surgery to decompress, and there are no guarantees that the patient would recover from the paralysis. Discitis-This is an infection of the intervertebral discs.  It occurs in about 1% of discography procedures.  It is difficult to treat and it may lead to surgery.        2. Pain: the needles have to go through skin and soft tissues, will cause soreness.       3. Damage to internal structures:  The nerves to be lesioned may be near blood vessels or    other nerves which can be potentially damaged.       4. Bleeding: Bleeding is more common if the patient is taking blood thinners such as  aspirin, Coumadin, Ticiid, Plavix, etc., or if he/she have some genetic predisposition  such as hemophilia. Bleeding into the spinal canal can cause compression of the spinal  cord with subsequent paralysis.  This would require an emergency surgery to  decompress and there are no guarantees that the patient would recover from the  paralysis.       5. Pneumothorax:  Puncturing of a lung is a possibility, every time a needle is introduced in  the area of the chest or upper back.  Pneumothorax refers to free air around the  collapsed lung(s),  inside of the thoracic cavity (chest cavity).  Another two possible  complications related to a similar event would include: Hemothorax and Chylothorax.   These are variations of the Pneumothorax, where instead of air around the collapsed  lung(s), you may have blood or chyle, respectively.       6. Spinal  headaches: They may occur with any procedures in the area of the spine.       7. Persistent CSF (Cerebro-Spinal Fluid) leakage: This is a rare problem, but may occur  with prolonged intrathecal or epidural catheters either due to the formation of a fistulous  track or a dural tear.       8. Nerve damage: By working so close to the spinal cord, there is always a possibility of  nerve damage, which could be as serious as a permanent spinal cord injury with  paralysis.       9. Death:  Although rare, severe deadly allergic reactions known as "Anaphylactic  reaction" can occur to any of the medications used.      10. Worsening of the symptoms:  We can always make thing worse.  What are the chances of something like this happening? Chances of any of this occuring are extremely low.  By statistics, you have more of a chance of getting killed in a motor vehicle accident: while driving to the hospital than any of the above occurring .  Nevertheless, you should be aware that they are possibilities.  In general, it is similar to taking a shower.  Everybody knows that you can slip, hit your head and get killed.  Does that mean that you should not shower again?  Nevertheless always keep in mind that statistics do not mean anything if you happen to be on the wrong side of them.  Even if a procedure has a 1 (one) in a 1,000,000 (million) chance of going wrong, it you happen to be that one..Also, keep in mind that by statistics, you have more of a chance of having something go wrong when taking medications.  Who should not have this procedure? If you are on a blood thinning medication (e.g. Coumadin, Plavix, see list of "Blood Thinners"), or if you have an active infection going on, you should not have the procedure.  If you are taking any blood thinners, please inform your physician.  How should I prepare for this procedure? Do not eat or drink anything at least six hours prior to the procedure. Bring a driver with  you .  It cannot be a taxi. Come accompanied by an adult that can drive you back, and that is strong enough to help you if your legs get weak or numb from the local anesthetic. Take all of your medicines the morning of the procedure with just enough water to swallow them. If you have diabetes, make sure that you are scheduled to have your procedure done first thing in the morning, whenever possible. If you have diabetes, take only half of your insulin dose and notify our nurse that you have done so as soon as you arrive at the clinic. If you are diabetic, but only take blood sugar pills (oral hypoglycemic), then do not take them on the morning of your procedure.  You may take them after you have had the procedure. Do not take aspirin or any aspirin-containing medications, at least eleven (11) days prior to the procedure.  They may prolong bleeding. Wear loose fitting clothing that may be easy to take  off and that you would not mind if it got stained with Betadine or blood. Do not wear any jewelry or perfume Remove any nail coloring.  It will interfere with some of our monitoring equipment.  NOTE: Remember that this is not meant to be interpreted as a complete list of all possible complications.  Unforeseen problems may occur.  BLOOD THINNERS The following drugs contain aspirin or other products, which can cause increased bleeding during surgery and should not be taken for 2 weeks prior to and 1 week after surgery.  If you should need take something for relief of minor pain, you may take acetaminophen which is found in Tylenol,m Datril, Anacin-3 and Panadol. It is not blood thinner. The products listed below are.  Do not take any of the products listed below in addition to any listed on your instruction sheet.  A.P.C or A.P.C with Codeine Codeine Phosphate Capsules #3 Ibuprofen Ridaura  ABC compound Congesprin Imuran rimadil  Advil Cope Indocin Robaxisal  Alka-Seltzer Effervescent Pain Reliever and  Antacid Coricidin or Coricidin-D  Indomethacin Rufen  Alka-Seltzer plus Cold Medicine Cosprin Ketoprofen S-A-C Tablets  Anacin Analgesic Tablets or Capsules Coumadin Korlgesic Salflex  Anacin Extra Strength Analgesic tablets or capsules CP-2 Tablets Lanoril Salicylate  Anaprox Cuprimine Capsules Levenox Salocol  Anexsia-D Dalteparin Magan Salsalate  Anodynos Darvon compound Magnesium Salicylate Sine-off  Ansaid Dasin Capsules Magsal Sodium Salicylate  Anturane Depen Capsules Marnal Soma  APF Arthritis pain formula Dewitt's Pills Measurin Stanback  Argesic Dia-Gesic Meclofenamic Sulfinpyrazone  Arthritis Bayer Timed Release Aspirin Diclofenac Meclomen Sulindac  Arthritis pain formula Anacin Dicumarol Medipren Supac  Analgesic (Safety coated) Arthralgen Diffunasal Mefanamic Suprofen  Arthritis Strength Bufferin Dihydrocodeine Mepro Compound Suprol  Arthropan liquid Dopirydamole Methcarbomol with Aspirin Synalgos  ASA tablets/Enseals Disalcid Micrainin Tagament  Ascriptin Doan's Midol Talwin  Ascriptin A/D Dolene Mobidin Tanderil  Ascriptin Extra Strength Dolobid Moblgesic Ticlid  Ascriptin with Codeine Doloprin or Doloprin with Codeine Momentum Tolectin  Asperbuf Duoprin Mono-gesic Trendar  Aspergum Duradyne Motrin or Motrin IB Triminicin  Aspirin plain, buffered or enteric coated Durasal Myochrisine Trigesic  Aspirin Suppositories Easprin Nalfon Trillsate  Aspirin with Codeine Ecotrin Regular or Extra Strength Naprosyn Uracel  Atromid-S Efficin Naproxen Ursinus  Auranofin Capsules Elmiron Neocylate Vanquish  Axotal Emagrin Norgesic Verin  Azathioprine Empirin or Empirin with Codeine Normiflo Vitamin E  Azolid Emprazil Nuprin Voltaren  Bayer Aspirin plain, buffered or children's or timed BC Tablets or powders Encaprin Orgaran Warfarin Sodium  Buff-a-Comp Enoxaparin Orudis Zorpin  Buff-a-Comp with Codeine Equegesic Os-Cal-Gesic   Buffaprin Excedrin plain, buffered or Extra Strength  Oxalid   Bufferin Arthritis Strength Feldene Oxphenbutazone   Bufferin plain or Extra Strength Feldene Capsules Oxycodone with Aspirin   Bufferin with Codeine Fenoprofen Fenoprofen Pabalate or Pabalate-SF   Buffets II Flogesic Panagesic   Buffinol plain or Extra Strength Florinal or Florinal with Codeine Panwarfarin   Buf-Tabs Flurbiprofen Penicillamine   Butalbital Compound Four-way cold tablets Penicillin   Butazolidin Fragmin Pepto-Bismol   Carbenicillin Geminisyn Percodan   Carna Arthritis Reliever Geopen Persantine   Carprofen Gold's salt Persistin   Chloramphenicol Goody's Phenylbutazone   Chloromycetin Haltrain Piroxlcam   Clmetidine heparin Plaquenil   Cllnoril Hyco-pap Ponstel   Clofibrate Hydroxy chloroquine Propoxyphen         Before stopping any of these medications, be sure to consult the physician who ordered them.  Some, such as Coumadin (Warfarin) are ordered to prevent or treat serious conditions such as "deep thrombosis", "pumonary embolisms", and other  heart problems.  The amount of time that you may need off of the medication may also vary with the medication and the reason for which you were taking it.  If you are taking any of these medications, please make sure you notify your pain physician before you undergo any procedures.         Pain Management Discharge Instructions  General Discharge Instructions :  If you need to reach your doctor call: Monday-Friday 8:00 am - 4:00 pm at [phone] or toll free [phone].  After clinic hours [phone] to have operator reach doctor.  Bring all of your medication bottles to all your appointments in the pain clinic.  To cancel or reschedule your appointment with Pain Management please remember to call 24 hours in advance to avoid a fee.  Refer to the educational materials which you have been given on: General Risks, I had my Procedure. Discharge Instructions, Post Sedation.  Post Procedure  Instructions:  The drugs you were given will stay in your system until tomorrow, so for the next 24 hours you should not drive, make any legal decisions or drink any alcoholic beverages.  You may eat anything you prefer, but it is better to start with liquids then soups and crackers, and gradually work up to solid foods.  Please notify your doctor immediately if you have any unusual bleeding, trouble breathing or pain that is not related to your normal pain.  Depending on the type of procedure that was done, some parts of your body may feel week and/or numb.  This usually clears up by tonight or the next day.  Walk with the use of an assistive device or accompanied by an adult for the 24 hours.  You may use ice on the affected area for the first 24 hours.  Put ice in a Ziploc bag and cover with a towel and place against area 15 minutes on 15 minutes off.  You may switch to heat after 24 hours.Epidural Steroid Injection Patient Information  Description: The epidural space surrounds the nerves as they exit the spinal cord.  In some patients, the nerves can be compressed and inflamed by a bulging disc or a tight spinal canal (spinal stenosis).  By injecting steroids into the epidural space, we can bring irritated nerves into direct contact with a potentially helpful medication.  These steroids act directly on the irritated nerves and can reduce swelling and inflammation which often leads to decreased pain.  Epidural steroids may be injected anywhere along the spine and from the neck to the low back depending upon the location of your pain.   After numbing the skin with local anesthetic (like Novocaine), a small needle is passed into the epidural space slowly.  You may experience a sensation of pressure while this is being done.  The entire block usually last less than 10 minutes.  Conditions which may be treated by epidural steroids:  Low back and leg pain Neck and arm pain Spinal  stenosis Post-laminectomy syndrome Herpes zoster (shingles) pain Pain from compression fractures  Preparation for the injection:  Do not eat any solid food or dairy products within 8 hours of your appointment.  You may drink clear liquids up to 3 hours before appointment.  Clear liquids include water, black coffee, juice or soda.  No milk or cream please. You may take your regular medication, including pain medications, with a sip of water before your appointment  Diabetics should hold regular insulin (if taken separately) and take  1/2 normal NPH dos the morning of the procedure.  Carry some sugar containing items with you to your appointment. A driver must accompany you and be prepared to drive you home after your procedure.  Bring all your current medications with your. An IV may be inserted and sedation may be given at the discretion of the physician.   A blood pressure cuff, EKG and other monitors will often be applied during the procedure.  Some patients may need to have extra oxygen administered for a short period. You will be asked to provide medical information, including your allergies, prior to the procedure.  We must know immediately if you are taking blood thinners (like Coumadin/Warfarin)  Or if you are allergic to IV iodine contrast (dye). We must know if you could possible be pregnant.  Possible side-effects: Bleeding from needle site Infection (rare, may require surgery) Nerve injury (rare) Numbness & tingling (temporary) Difficulty urinating (rare, temporary) Spinal headache ( a headache worse with upright posture) Light -headedness (temporary) Pain at injection site (several days) Decreased blood pressure (temporary) Weakness in arm/leg (temporary) Pressure sensation in back/neck (temporary)  Call if you experience: Fever/chills associated with headache or increased back/neck pain. Headache worsened by an upright position. New onset weakness or numbness of an  extremity below the injection site Hives or difficulty breathing (go to the emergency room) Inflammation or drainage at the infection site Severe back/neck pain Any new symptoms which are concerning to you  Please note:  Although the local anesthetic injected can often make your back or neck feel good for several hours after the injection, the pain will likely return.  It takes 3-7 days for steroids to work in the epidural space.  You may not notice any pain relief for at least that one week.  If effective, we will often do a series of three injections spaced 3-6 weeks apart to maximally decrease your pain.  After the initial series, we generally will wait several months before considering a repeat injection of the same type.  If you have any questions, please call [phone] [facility]

## 2021-03-25 ENCOUNTER — Telehealth: Payer: Self-pay | Admitting: Gastroenterology

## 2021-03-25 ENCOUNTER — Encounter: Payer: Self-pay | Admitting: Gastroenterology

## 2021-03-25 NOTE — Telephone Encounter (Signed)
Hey Dr. Loletha Carrow,  ? ?Patient called in to cancel procedure for 3/23. She states she is scheduled at [place].  ? ?Thank you ?

## 2021-03-27 NOTE — Telephone Encounter (Signed)
Thank you for the note. ? ?As that is the case, she has transferred her care to [facility] and does not need to be scheduled for further appointments with [place]. ? ?- HD ?

## 2021-03-31 ENCOUNTER — Encounter: Payer: Medicare Other | Admitting: Gastroenterology

## 2021-04-03 ENCOUNTER — Other Ambulatory Visit: Payer: Self-pay | Admitting: Physician Assistant

## 2021-04-03 DIAGNOSIS — J4520 Mild intermittent asthma, uncomplicated: Secondary | ICD-10-CM

## 2021-04-05 ENCOUNTER — Other Ambulatory Visit: Payer: Self-pay | Admitting: Gastroenterology

## 2021-04-05 DIAGNOSIS — K5903 Drug induced constipation: Secondary | ICD-10-CM

## 2021-04-05 DIAGNOSIS — K219 Gastro-esophageal reflux disease without esophagitis: Secondary | ICD-10-CM

## 2021-04-05 DIAGNOSIS — Z800 Family history of malignant neoplasm of digestive organs: Secondary | ICD-10-CM

## 2021-04-05 DIAGNOSIS — R1319 Other dysphagia: Secondary | ICD-10-CM

## 2021-04-11 NOTE — Progress Notes (Signed)
PROVIDER NOTE: Interpretation of information contained herein should be left to medically-trained personnel. Specific patient instructions are provided elsewhere under "Patient Instructions" section of medical record. This document was created in part using STT-dictation technology, any transcriptional errors that may result from this process are unintentional.  ?Patient: Rachael West ?Type: Established ?DOB: May 02, 1955 ?MRN: [id number] ?PCP: Rachael Reid, PA-C  Service: Procedure ?DOS: 04/12/2021 ?Setting: Ambulatory ?Location: Ambulatory outpatient facility ?Delivery: Face-to-face Provider: Gaspar Cola, MD ?Specialty: Interventional Pain Management ?Specialty designation: 09 ?Location: Outpatient facility ?Ref. Prov.: Rachael Reid, PA-C   ?Procedure [facility] )  ? Type: Cervical Epidural Steroid injection (ESI) (Interlaminar) #3  ?Laterality: Right  ?Level: C7-T1 ?Imaging: Fluoroscopy-assisted ?DOS: 04/12/2021  ?Performed by: Rachael Pointer, MD ?Anesthesia: Local anesthesia (1-2% Lidocaine) ?Anxiolysis: IV Versed 2.0 mg ?Sedation: None.  ? ?Purpose: Diagnostic/Therapeutic ?Indications: Cervicalgia, cervical radicular pain, degenerative disc disease, severe enough to impact quality of life or function. ?1. DDD (degenerative disc disease), cervical   ?2. Cervical disc disorder at C5-C6 level with radiculopathy (Right)   ?3. 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 Cervical radiculopathy at C6 (Right)   ?5. Cervicalgia   ?6. Cervicogenic headache   ?7. Abnormal MRI, cervical spine (11/12/2019)   ? Latex precautions, history of latex allergy   ? ?NAS-11 score:  ? Pre-procedure: 8 /10  ? Post-procedure: 2  (putting on clothes and moving head)/10  ?  ? ?Today the patient has indicated that she is interested in having her lower back treated next.  We will first see how she does with this cervical epidural and whether or not it does provide her with some  benefit in the lower lumbar area as well. ? ?Pre-Procedure Preparation  ?Monitoring: As per clinic protocol. Respiration, ETCO2, SpO2, BP, heart rate and rhythm monitor placed and checked for adequate function  ?Risk Assessment: ?Vitals:  YFV:CBSWHQPRF body mass index is 30.12 kg/m? as calculated from the following: ?  Height as of this encounter: '5\' 5"'$  (1.651 m). ?  Weight as of this encounter: 181 lb (82.1 kg)., Rate:89ECG Heart Rate: 76, BP:(!) 145/94, Resp:16, Temp:(!) 97.3 ?F (36.3 ?C), SpO2:99 %  ?Allergies: She is allergic to latex, penicillins, codeine, cortisone, and sulfa antibiotics.  ?Precautions: None required  ?Blood-thinner(s): None at this time  ?Coagulopathies: Reviewed. None identified.   ?Active Infection(s): Reviewed. None identified. Rachael West is afebrile  ? ?Location setting: Procedure suite ?Position: Prone, on modified reverse trendelenburg to facilitate breathing, with head in head-cradle. Pillows positioned under chest (below chin-level) with cervical spine flexed. ?Safety Precautions: Patient was assessed for positional comfort and pressure points before starting the procedure. ?Prepping solution: DuraPrep (Iodine Povacrylex [0.7% available iodine] and Isopropyl Alcohol, 74% w/w) ?Prep Area: Entire  cervicothoracic region ?Approach: percutaneous, paramedial ?Intended target: Posterior cervical epidural space ?Materials: ?Tray: Epidural ?Needle(s): Epidural (Tuohy) ?Qty: 1 ?Length: (91m) 3.5-inch ?Gauge: 17G  ? ?Meds ordered this encounter  ?Medications  ? iohexol (OMNIPAQUE) 180 MG/ML injection 10 mL  ?  Must be Myelogram-compatible. If not available, you may substitute with a water-soluble, non-ionic, hypoallergenic, myelogram-compatible radiological contrast medium.  ? lidocaine (XYLOCAINE) 2 % (with pres) injection 400 mg  ? DISCONTD: pentafluoroprop-tetrafluoroeth (GEBAUERS) aerosol  ? lactated ringers infusion 1,000 mL  ? midazolam (VERSED) 5 MG/5ML injection 0.5-2 mg  ?   Make sure Flumazenil is available in the pyxis when using this medication. If oversedation occurs, administer 0.2 mg IV over 15 sec. If after 45 sec no response, administer  0.2 mg again over 1 min; may repeat at 1 min intervals; not to exceed 4 doses (1 mg)  ? sodium chloride flush (NS) 0.9 % injection 2 mL  ? ropivacaine (PF) 2 mg/mL (0.2%) (NAROPIN) injection 2 mL  ? triamcinolone acetonide (KENALOG-40) injection 40 mg  ? sodium chloride flush (NS) 0.9 % injection 1 mL  ? ropivacaine (PF) 2 mg/mL (0.2%) (NAROPIN) injection 1 mL  ? dexamethasone (DECADRON) injection 10 mg  ?  Orders Placed This Encounter  ?Procedures  ? Cervical Epidural Injection  ?  Indication(s): Radiculitis and cervicalgia associated with cervical degenerative disc disease. ?Position: Prone ?Imaging guidance: Fluoroscopy required. Contrast required unless contraindicated by allergy or severe CKD. ?Equipment & Materials: Epidural tray & needle.  ?  Scheduling Instructions:  ?   Procedure: Cervical Epidural Steroid Injection/Block  ?   Planned Level(s): C7-T1  ?   Laterality: Right-sided  ?   Anxiolysis: Patient's choice.  ?   Timeframe: Today  ?  Order Specific Question:   Where will this procedure be performed?  ?  Answer:   [facility]  ?  Comments:   by Dr. Dossie West  ? [place] C-ARM 1-60 MIN NO REPORT  ?  Intraoperative interpretation by procedural physician at [place].  ?  Standing Status:   Standing  ?  Number of Occurrences:   1  ?  Order Specific Question:   Reason for exam:  ?  Answer:   Assistance in needle guidance and placement for procedures requiring needle placement in or near specific anatomical locations not easily accessible without such assistance.  ? Informed Consent Details: Physician/Practitioner Attestation; Transcribe to consent form and obtain patient signature  ?  Nursing Order: Transcribe to consent form and obtain patient signature. ?Note: Always confirm laterality of pain with Ms.  West, before procedure.  ?  Order Specific Question:   Physician/Practitioner attestation of informed consent for procedure/surgical case  ?  Answer:   I, the physician/practitioner, attest that I have discussed with the patient the benefits, risks, side effects, alternatives, likelihood of achieving goals and potential problems during recovery for the procedure that I have provided informed consent.  ?  Order Specific Question:   Procedure  ?  Answer:   Caudal epidural steroid injection  ?  Order Specific Question:   Physician/Practitioner performing the procedure  ?  Answer:   Janathan Bribiesca A. Rachael Arbour, MD  ?  Order Specific Question:   Indication/Reason  ?  Answer:   Low back pain and lower extremity pain secondary to lumbosacral radiculitis  ? Provide equipment / supplies at bedside  ?  "Epidural Tray" (Disposable  single use) ?Catheter: NOT required  ?  Standing Status:   Standing  ?  Number of Occurrences:   1  ?  Order Specific Question:   Specify  ?  Answer:   Epidural Tray  ? Informed Consent Details: Physician/Practitioner Attestation; Transcribe to consent form and obtain patient signature  ?  Nursing instructions: Transcribe to consent form and obtain patient signature. Always confirm laterality of pain with Ms. Perris, before procedure.  ?  Order Specific Question:   Physician/Practitioner attestation of informed consent for procedure/surgical case  ?  Answer:   I, the physician/practitioner, attest that I have discussed with the patient the benefits, risks, side effects, alternatives, likelihood of achieving goals and potential problems during recovery for the procedure that I have provided informed consent.  ?  Order Specific Question:   Procedure  ?  Answer:   Cervical Epidural Steroid Injection (CESI) under fluoroscopic guidance  ?  Order Specific Question:   Physician/Practitioner performing the procedure  ?  Answer:   Nayelie Gionfriddo A. Rachael Arbour MD  ?  Order Specific Question:    Indication/Reason  ?  Answer:   Indications: Cervicalgia (neck pain), cervical radicular pain, radiculitis (arm/shoulder pain, numbness, and/or weakness), degenerative disc disease, severe enough to greatly impact quality of

## 2021-04-12 ENCOUNTER — Ambulatory Visit (HOSPITAL_BASED_OUTPATIENT_CLINIC_OR_DEPARTMENT_OTHER): Payer: Medicare Other | Admitting: Pain Medicine

## 2021-04-12 ENCOUNTER — Ambulatory Visit
Admission: RE | Admit: 2021-04-12 | Discharge: 2021-04-12 | Disposition: A | Discharge status: Home / Self Care | Payer: Medicare Other | Source: Ambulatory Visit | Attending: Pain Medicine | Admitting: Pain Medicine

## 2021-04-12 ENCOUNTER — Encounter: Payer: Self-pay | Admitting: Pain Medicine

## 2021-04-12 VITALS — BP 124/93 | HR 89 | Temp 97.3°F | Resp 16 | Ht 65.0 in | Wt 181.0 lb

## 2021-04-12 DIAGNOSIS — M5412 Radiculopathy, cervical region: Secondary | ICD-10-CM | POA: Diagnosis present

## 2021-04-12 DIAGNOSIS — M4802 Spinal stenosis, cervical region: Secondary | ICD-10-CM

## 2021-04-12 DIAGNOSIS — Z91040 Latex allergy status: Secondary | ICD-10-CM

## 2021-04-12 DIAGNOSIS — R937 Abnormal findings on diagnostic imaging of other parts of musculoskeletal system: Secondary | ICD-10-CM | POA: Insufficient documentation

## 2021-04-12 DIAGNOSIS — G4486 Cervicogenic headache: Secondary | ICD-10-CM

## 2021-04-12 DIAGNOSIS — M542 Cervicalgia: Secondary | ICD-10-CM | POA: Insufficient documentation

## 2021-04-12 DIAGNOSIS — M5030 Other cervical disc degeneration, unspecified cervical region: Secondary | ICD-10-CM | POA: Insufficient documentation

## 2021-04-12 DIAGNOSIS — M50122 Cervical disc disorder at C5-C6 level with radiculopathy: Secondary | ICD-10-CM

## 2021-04-12 MED ORDER — IOHEXOL 180 MG/ML  SOLN
10.0000 mL | Freq: Once | INTRAMUSCULAR | Status: AC
  Administered 2021-04-12: 5 mL via EPIDURAL

## 2021-04-12 MED ORDER — SODIUM CHLORIDE (PF) 0.9 % IJ SOLN
INTRAMUSCULAR | Status: AC
  Filled 2021-04-12: qty 10

## 2021-04-12 MED ORDER — ROPIVACAINE HCL 2 MG/ML IJ SOLN
INTRAMUSCULAR | Status: AC
  Filled 2021-04-12: qty 20

## 2021-04-12 MED ORDER — MIDAZOLAM HCL 5 MG/5ML IJ SOLN
0.5000 mg | Freq: Once | INTRAMUSCULAR | Status: AC
  Administered 2021-04-12: 2 mg via INTRAVENOUS

## 2021-04-12 MED ORDER — ROPIVACAINE HCL 2 MG/ML IJ SOLN: 1.0000 mL | Freq: Once | INTRAMUSCULAR | Status: DC

## 2021-04-12 MED ORDER — LACTATED RINGERS IV SOLN
1000.0000 mL | Freq: Once | INTRAVENOUS | Status: AC
  Administered 2021-04-12: 1000 mL via INTRAVENOUS

## 2021-04-12 MED ORDER — LIDOCAINE HCL 2 % IJ SOLN
20.0000 mL | Freq: Once | INTRAMUSCULAR | Status: AC
  Administered 2021-04-12: 100 mg

## 2021-04-12 MED ORDER — TRIAMCINOLONE ACETONIDE 40 MG/ML IJ SUSP
INTRAMUSCULAR | Status: AC
  Filled 2021-04-12: qty 1

## 2021-04-12 MED ORDER — ROPIVACAINE HCL 2 MG/ML IJ SOLN
2.0000 mL | Freq: Once | INTRAMUSCULAR | Status: AC
  Administered 2021-04-12: 2 mL via EPIDURAL

## 2021-04-12 MED ORDER — TRIAMCINOLONE ACETONIDE 40 MG/ML IJ SUSP
40.0000 mg | Freq: Once | INTRAMUSCULAR | Status: AC
  Administered 2021-04-12: 40 mg

## 2021-04-12 MED ORDER — LIDOCAINE HCL (PF) 2 % IJ SOLN
INTRAMUSCULAR | Status: AC
  Filled 2021-04-12: qty 5

## 2021-04-12 MED ORDER — SODIUM CHLORIDE 0.9% FLUSH: 1.0000 mL | Freq: Once | INTRAVENOUS | Status: DC

## 2021-04-12 MED ORDER — DEXAMETHASONE SODIUM PHOSPHATE 10 MG/ML IJ SOLN: 10.0000 mg | Freq: Once | INTRAMUSCULAR | Status: DC

## 2021-04-12 MED ORDER — SODIUM CHLORIDE 0.9% FLUSH
2.0000 mL | Freq: Once | INTRAVENOUS | Status: AC
  Administered 2021-04-12: 2 mL

## 2021-04-12 MED ORDER — DEXAMETHASONE SODIUM PHOSPHATE 10 MG/ML IJ SOLN
INTRAMUSCULAR | Status: AC
  Filled 2021-04-12: qty 1

## 2021-04-12 MED ORDER — MIDAZOLAM HCL 5 MG/5ML IJ SOLN
INTRAMUSCULAR | Status: AC
  Filled 2021-04-12: qty 5

## 2021-04-12 MED ORDER — PENTAFLUOROPROP-TETRAFLUOROETH EX AERO: INHALATION_SPRAY | Freq: Once | CUTANEOUS | Status: DC

## 2021-04-12 NOTE — Patient Instructions (Signed)

## 2021-04-12 NOTE — Progress Notes (Signed)
Safety precautions to be maintained throughout the outpatient stay will include: orient to surroundings, keep bed in low position, maintain call bell within reach at all times, provide assistance with transfer out of bed and ambulation.  

## 2021-04-13 ENCOUNTER — Telehealth: Payer: Self-pay | Admitting: *Deleted

## 2021-04-13 NOTE — Telephone Encounter (Signed)
No problems post procedure. 

## 2021-04-20 ENCOUNTER — Other Ambulatory Visit: Payer: Self-pay | Admitting: Physician Assistant

## 2021-04-20 ENCOUNTER — Other Ambulatory Visit: Payer: Self-pay | Admitting: Pain Medicine

## 2021-04-20 DIAGNOSIS — R209 Unspecified disturbances of skin sensation: Secondary | ICD-10-CM

## 2021-04-20 DIAGNOSIS — Z79899 Other long term (current) drug therapy: Secondary | ICD-10-CM

## 2021-04-20 DIAGNOSIS — G8929 Other chronic pain: Secondary | ICD-10-CM

## 2021-04-20 DIAGNOSIS — M797 Fibromyalgia: Secondary | ICD-10-CM

## 2021-04-20 DIAGNOSIS — J4520 Mild intermittent asthma, uncomplicated: Secondary | ICD-10-CM

## 2021-04-20 DIAGNOSIS — R208 Other disturbances of skin sensation: Secondary | ICD-10-CM

## 2021-04-24 ENCOUNTER — Other Ambulatory Visit: Payer: Self-pay | Admitting: Physician Assistant

## 2021-04-24 DIAGNOSIS — J302 Other seasonal allergic rhinitis: Secondary | ICD-10-CM

## 2021-04-24 DIAGNOSIS — M1991 Primary osteoarthritis, unspecified site: Secondary | ICD-10-CM

## 2021-04-24 DIAGNOSIS — J4520 Mild intermittent asthma, uncomplicated: Secondary | ICD-10-CM

## 2021-05-01 NOTE — Progress Notes (Signed)
Patient: [name]  Service Category: E/M  Provider: Gaspar Cola, MD  ?DOB: 10/29/1955  DOS: 05/03/2021  Location: Office  ?MRN: [id number]  Setting: Ambulatory outpatient  Referring Provider: Lorrene Reid, PA-C  ?Type: Established Patient  Specialty: Interventional Pain Management  PCP: Lorrene Reid, PA-C  ?Location: Remote location  Delivery: TeleHealth    ? ?Virtual Encounter - Pain Management ?PROVIDER NOTE: Information contained herein reflects review and annotations entered in association with encounter. Interpretation of such information and data should be left to medically-trained personnel. Information provided to patient can be located elsewhere in the medical record under "Patient Instructions". Document created using STT-dictation technology, any transcriptional errors that may result from process are unintentional.  ?  ?Contact & Pharmacy ?Preferred: [phone] ?Home: [phone] (home) ?Mobile: [phone] (mobile) ?E-mail: [email or web address]  ?[id number] [place] [address]?Phone: [phone]: [phone]? ?[place] [address] ?[place]?Phone: [phone]: [phone]?  ?Pre-screening  ?[name] offered "in-person" vs "virtual" encounter. She indicated preferring virtual for this encounter.  ? ?Reason ?COVID-19*  Social distancing based on CDC and AMA recommendations.  ? ?I contacted LMilton Fergusonon 05/03/2021 via telephone.      I clearly identified myself as FGaspar Cola MD. I verified that I was speaking with the correct person using two identifiers (Name: LAYMEE FOMBY and date of birth: 8Jul 02, 1957. ? ?Consent ?I sought verbal advanced consent from LMilton Fergusonfor virtual visit interactions. I informed [name] of possible  security and privacy concerns, risks, and limitations associated with providing "not-in-person" medical evaluation and management services. I also informed [name] of the availability of "in-person" appointments. Finally, I informed her that there would be a charge for the virtual visit and that she could be  personally, fully or partially, financially responsible for it. Ms. MLemireexpressed understanding and agreed to proceed.  ? ?Historic Elements   ?Ms. LWALESKA BUTTERYis a 66y.o. year old, female patient evaluated today after our last contact on 04/20/2021. [name] has a past medical history of Anemia, Anxiety, Arthritis, Asthma, Depression, GERD (gastroesophageal reflux disease), Hepatitis-C, History of chemotherapy, Hypertension, Insomnia, Lymphoma ([place] (12[place]2013), Personal history of non-Hodgkin lymphomas, and Vitamin D deficiency. She also  has a past surgical history that includes Appendectomy; Trigger finger release; Esophagogastroduodenoscopy (01/17/2011); Colonoscopy (01/17/2011); Abdominal hysterectomy; and Breast biopsy (Left, 2015). Ms. MKomarhas a current medication list which includes the following prescription(s): albuterol, atenolol, b complex vitamins, buspirone, dimenhydrinate, fluticasone-salmeterol, hydroxyzine, hydroxyzine, ipratropium-albuterol, latanoprost, lisinopril, loratadine, meloxicam, mirabegron er, montelukast, omeprazole, ondansetron, paliperidone, plenvu, polyethylene glycol, prazosin, sertraline, tramadol, trazodone, gabapentin, lubiprostone, magnesium, and benefiber. She  reports that she quit smoking about 31 years ago. Her smoking use included cigarettes. She has a 30.00 pack-year smoking history. She has never used smokeless tobacco. She reports that she does not drink alcohol and does not use drugs. Ms. MTippingis allergic to latex, penicillins, codeine, cortisone, and sulfa antibiotics.  ? ?HPI  ?Today, she is being contacted for  a post-procedure assessment.  According to the patient this last cervical epidural steroid injection has provided her with an ongoing 80% relief of her neck pain and upper extremity pain.  She refers that at this point she no longer has any of the arm pain but she still has weakness and some numbness.  The  patient's last cervical MRI was done on 11/12/2019.  At that time it showed the patient to have multilevel disc and facet degeneration with right foraminal impingement at C3-4, C5-6, C6-7 levels.  It also showed some left foraminal impingement at C5-6 and C6-7.  Since the patient's primary problem seems to be on the right side, it is likely to be secondary to that multilevel foraminal stenosis that was described on that cervical MRI.  However, the patient indicates that over time things have been getting worse and therefore we will probably consider repeating her cervical MRI if her symptoms begin to come back again.  For the time being, we will simply observe. ? ?Post-procedure evaluation  ? Type: Cervical Epidural Steroid injection (ESI) (Interlaminar) #3  ?Laterality: Right  ?Level: C7-T1 ?Imaging: Fluoroscopy-assisted ?DOS: 04/12/2021  ?Performed by: Milinda Pointer, MD ?Anesthesia: Local anesthesia (1-2% Lidocaine) ?Anxiolysis: IV Versed 2.0 mg ?Sedation: None.  ? ?Purpose: Diagnostic/Therapeutic ?Indications: Cervicalgia, cervical radicular pain, degenerative disc disease, severe enough to impact quality of life or function. ?1. DDD (degenerative disc disease), cervical   ?2. Cervical disc disorder at C5-C6 level with radiculopathy (Right)   ?3. 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 Cervical radiculopathy at C6 (Right)   ?5. Cervicalgia   ?6. Cervicogenic headache   ?7. Abnormal MRI, cervical spine (11/12/2019)   ? Latex precautions, history of latex allergy   ? ?NAS-11 score:  ? Pre-procedure: 8 /10  ? Post-procedure: 2  (putting on clothes and moving head)/10  ?   ?Effectiveness:   ?Initial hour after procedure: 100 %. ?Subsequent 4-6 hours post-procedure: 100 %. ?Analgesia past initial 6 hours: 80 % (ongoing). ?Ongoing improvement:  ?Analgesic: The patient indicates currently having an ongoing 80% relief of her neck pain and upper extremity pain.  She refers that at this point she no longer has any of the arm pain but she still has weakness and some numbness.  ?Function: Ms. Southgate reports improvement in function ?ROM: Ms. Macfarlane reports improvement in ROM ? ?Pharmacotherapy Assessment  ? ?Opioid Analgesic: Tramadol 50 mg, 2 tabs (100 mg) PO q 6 hrs (400 mg/day of tramadol) ?MME/day: 40 mg/day.  ? ?Monitoring: ?[place] PMP: PDMP reviewed during this encounter.       ?Pharmacotherapy: No side-effects or adverse reactions reported. ?Compliance: No problems identified. ?Effectiveness: Clinically acceptable. ?Plan: Refer to "POC". UDS:  ?Summary  ?Date Value Ref Range Status  ?07/26/2020 Note  Final  ?  Comment:  ?  ==================================================================== ?[employer] Select 13 (MW) ?==================================================================== ?Test                             Result       Flag       Units ? ?Drug Present and Declared for Prescription Verification ?  Tramadol                       >6667        EXPECTED   ng/mg creat ?  O-Desmethyltramadol            >1324        EXPECTED   ng/mg creat ?  N-Desmethyltramadol            >4010        EXPECTED   ng/mg creat ?   Source of tramadol is a prescription medication. O-desmethyltramadol ?   and N-desmethyltramadol are expected metabolites of tramadol. ? ?==================================================================== ?  Test                      Result    Flag   Units      Ref Range ?  Creatinine              75               mg/dL      >=20 ?==================================================================== ?Declared Medications: ? The flagging and interpretation on this report are based on  the ? following declared medications.  Unexpected results may arise from ? inaccuracies in the declared medications. ? ? **Note: The testing scope of this panel includes these medications: ? ? Tramadol

## 2021-05-03 ENCOUNTER — Ambulatory Visit: Payer: Medicare Other | Attending: Pain Medicine | Admitting: Pain Medicine

## 2021-05-03 DIAGNOSIS — M5412 Radiculopathy, cervical region: Secondary | ICD-10-CM | POA: Diagnosis not present

## 2021-05-03 DIAGNOSIS — M792 Neuralgia and neuritis, unspecified: Secondary | ICD-10-CM | POA: Diagnosis not present

## 2021-05-03 DIAGNOSIS — R937 Abnormal findings on diagnostic imaging of other parts of musculoskeletal system: Secondary | ICD-10-CM

## 2021-05-03 DIAGNOSIS — G8929 Other chronic pain: Secondary | ICD-10-CM

## 2021-05-03 DIAGNOSIS — M542 Cervicalgia: Secondary | ICD-10-CM | POA: Diagnosis not present

## 2021-05-03 DIAGNOSIS — M79601 Pain in right arm: Secondary | ICD-10-CM | POA: Diagnosis not present

## 2021-05-03 DIAGNOSIS — M50122 Cervical disc disorder at C5-C6 level with radiculopathy: Secondary | ICD-10-CM

## 2021-05-03 DIAGNOSIS — M25511 Pain in right shoulder: Secondary | ICD-10-CM

## 2021-05-03 DIAGNOSIS — G4486 Cervicogenic headache: Secondary | ICD-10-CM

## 2021-05-03 DIAGNOSIS — M4802 Spinal stenosis, cervical region: Secondary | ICD-10-CM

## 2021-05-04 ENCOUNTER — Other Ambulatory Visit: Payer: Self-pay | Admitting: Pain Medicine

## 2021-05-04 ENCOUNTER — Other Ambulatory Visit: Payer: Self-pay | Admitting: Physician Assistant

## 2021-05-04 DIAGNOSIS — E559 Vitamin D deficiency, unspecified: Secondary | ICD-10-CM

## 2021-05-04 DIAGNOSIS — J4520 Mild intermittent asthma, uncomplicated: Secondary | ICD-10-CM

## 2021-05-04 DIAGNOSIS — G8929 Other chronic pain: Secondary | ICD-10-CM

## 2021-05-04 DIAGNOSIS — M1991 Primary osteoarthritis, unspecified site: Secondary | ICD-10-CM

## 2021-05-04 MED ORDER — ALBUTEROL SULFATE HFA 108 (90 BASE) MCG/ACT IN AERS: INHALATION_SPRAY | RESPIRATORY_TRACT | 0 refills | Status: DC

## 2021-05-04 MED ORDER — IPRATROPIUM-ALBUTEROL 0.5-2.5 (3) MG/3ML IN SOLN: RESPIRATORY_TRACT | 0 refills | Status: DC

## 2021-05-07 ENCOUNTER — Other Ambulatory Visit: Payer: Self-pay | Admitting: Physician Assistant

## 2021-05-07 DIAGNOSIS — J4520 Mild intermittent asthma, uncomplicated: Secondary | ICD-10-CM

## 2021-05-19 ENCOUNTER — Other Ambulatory Visit: Payer: Self-pay | Admitting: Pain Medicine

## 2021-05-19 ENCOUNTER — Other Ambulatory Visit: Payer: Self-pay | Admitting: Physician Assistant

## 2021-05-19 DIAGNOSIS — M797 Fibromyalgia: Secondary | ICD-10-CM

## 2021-05-19 DIAGNOSIS — Z79899 Other long term (current) drug therapy: Secondary | ICD-10-CM

## 2021-05-19 DIAGNOSIS — G8929 Other chronic pain: Secondary | ICD-10-CM

## 2021-05-19 DIAGNOSIS — R208 Other disturbances of skin sensation: Secondary | ICD-10-CM

## 2021-05-19 DIAGNOSIS — J4520 Mild intermittent asthma, uncomplicated: Secondary | ICD-10-CM

## 2021-05-19 DIAGNOSIS — R209 Unspecified disturbances of skin sensation: Secondary | ICD-10-CM

## 2021-05-20 MED ORDER — ALBUTEROL SULFATE HFA 108 (90 BASE) MCG/ACT IN AERS: INHALATION_SPRAY | RESPIRATORY_TRACT | 0 refills | Status: DC

## 2021-05-20 MED ORDER — LATANOPROST 0.005 % OP SOLN: 1.0000 [drp] | Freq: Every day | OPHTHALMIC | 0 refills | Status: DC

## 2021-05-20 MED ORDER — LORATADINE 10 MG PO TABS: 10.0000 mg | ORAL_TABLET | Freq: Every day | ORAL | 0 refills | Status: DC

## 2021-05-23 ENCOUNTER — Other Ambulatory Visit: Payer: Self-pay | Admitting: Physician Assistant

## 2021-05-23 DIAGNOSIS — Z1231 Encounter for screening mammogram for malignant neoplasm of breast: Secondary | ICD-10-CM

## 2021-05-26 ENCOUNTER — Ambulatory Visit: Payer: Medicare Other | Admitting: Physician Assistant

## 2021-06-15 HISTORY — PX: COLONOSCOPY: SHX174

## 2021-06-15 HISTORY — PX: UPPER GI ENDOSCOPY: SHX6162

## 2021-06-15 LAB — HM COLONOSCOPY

## 2021-06-18 ENCOUNTER — Other Ambulatory Visit: Payer: Self-pay | Admitting: Physician Assistant

## 2021-06-26 ENCOUNTER — Other Ambulatory Visit: Payer: Self-pay | Admitting: Physician Assistant

## 2021-06-26 DIAGNOSIS — J4520 Mild intermittent asthma, uncomplicated: Secondary | ICD-10-CM

## 2021-06-26 NOTE — Progress Notes (Unsigned)
PROVIDER NOTE: Information contained herein reflects review and annotations entered in association with encounter. Interpretation of such information and data should be left to medically-trained personnel. Information provided to patient can be located elsewhere in the medical record under "Patient Instructions". Document created using STT-dictation technology, any transcriptional errors that may result from process are unintentional.    Patient: Rachael West  Service Category: E/M  Provider: Gaspar Cola, MD  DOB: 1955-10-20  DOS: 06/27/2021  Specialty: Interventional Pain Management  MRN: [id number]  Setting: Ambulatory outpatient  PCP: Lorrene Reid, PA-C  Type: Established Patient    Referring Provider: Lorrene Reid, PA-C  Location: Office  Delivery: Face-to-face     HPI  [name], a 66 y.o. year old female, is here today because of her Chronic pain syndrome [G89.4]. Rachael West primary complain today is No chief complaint on file. Last encounter: My last encounter with her was on 05/19/2021. Pertinent problems: Rachael West has Large B-cell lymphoma ([place]); Atypical chest pain; Chronic low back pain (Primary Area of Pain) (Bilateral) w/ sciatica (Left); Chronic lower extremity pain (2ry area of Pain) (Left); Chronic upper back pain (3ry area of Pain) (Bilateral) (L>R); Chronic neck pain (4th area of Pain) (Bilateral) (L>R); Occipital headache; Chronic knee pain (Bilateral) (R>L); Chronic pain syndrome; Chronic sacroiliac joint pain; DDD (degenerative disc disease), lumbar; Lumbar facet arthropathy; Lumbar facet syndrome (Bilateral) (L>R); Spondylosis without myelopathy or radiculopathy, lumbar region; DDD (degenerative disc disease), thoracic; Thoracolumbar dextroscoliosis; Cervicogenic headache; Tendinitis of rotator cuff (Left); Tendinitis of rotator cuff (Right); Fibromyalgia; Chronic shoulder pain (Bilateral) (R>L); Chronic hip pain (Bilateral) (L>R);  Cervicalgia; Chronic idiopathic gout; DDD (degenerative disc disease), cervical; 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); Cervical facet syndrome (Bilateral) (R>L); Spondylosis without myelopathy or radiculopathy, cervical region; Chronic musculoskeletal pain; Chronic low back pain (1ry area of Pain) (Bilateral) (L>R) w/o sciatica; Cervical disc disorder at C5-C6 level with radiculopathy (Right); Cervical radiculopathy at C6 (Right); Osteoarthrosis of multiple sites; Radicular pain of upper extremity (Right); Chronic upper extremity pain (Right); Chronic shoulder pain (Right); Arthralgia of shoulder region (Right); Shoulder enthesopathy (Right); Musculoskeletal disorder involving upper trapezius muscle (Right); Abnormal MRI, cervical spine (11/12/2019); Chronic neuropathic pain; Disturbance of skin sensation; Hyperalgesia; Trigger finger, middle finger (Right); and Trigger point of shoulder region (Right) on their pertinent problem list. Pain Assessment: Severity of   is reported as a  /10. Location:    / . Onset:  . Quality:  . Timing:  . Modifying factor(s):  Marland Kitchen Vitals:  vitals were not taken for this visit.   Reason for encounter: medication management. ***   Nonopioids transfer 10/27/2019: Benefiber, Amitiza, magnesium, Mobic, and Neurontin  Pharmacotherapy Assessment  Analgesic: Tramadol 50 mg, 2 tabs (100 mg) PO q 6 hrs (400 mg/day of tramadol) MME/day: 40 mg/day.   Monitoring: [place] PMP: PDMP reviewed during this encounter.       Pharmacotherapy: No side-effects or adverse reactions reported. Compliance: No problems identified. Effectiveness: Clinically acceptable.  No notes on file  UDS:  Summary  Date Value Ref Range Status  07/26/2020 Note  Final    Comment:    ==================================================================== [employer] Select 13 (MW) ==================================================================== Test                             Result        Flag       Units  Drug Present and Declared for Prescription Verification   Tramadol                       >  1610        EXPECTED   ng/mg creat   O-Desmethyltramadol            >6667        EXPECTED   ng/mg creat   N-Desmethyltramadol            >6667        EXPECTED   ng/mg creat    Source of tramadol is a prescription medication. O-desmethyltramadol    and N-desmethyltramadol are expected metabolites of tramadol.  ==================================================================== Test                      Result    Flag   Units      Ref Range   Creatinine              75               mg/dL      >=20 ==================================================================== Declared Medications:  The flagging and interpretation on this report are based on the  following declared medications.  Unexpected results may arise from  inaccuracies in the declared medications.   **Note: The testing scope of this panel includes these medications:   Tramadol   **Note: The testing scope of this panel does not include the  following reported medications:   Albuterol  Atenolol  Buspirone  Dimenhydrinate  Fluticasone (Advair)  Gabapentin  Hydroxyzine  Ipratropium  Latanoprost  Lisinopril  Loratadine  Lubiprostone  Magnesium  Meloxicam  Mirabegron  Montelukast  Omeprazole  Ondansetron  Paliperidone  Polyethylene Glycol (MiraLAX)  Prazosin  Salmeterol (Advair)  Sertraline  Trazodone ==================================================================== For clinical consultation, please call [phone]. ====================================================================      ROS  Constitutional: Denies any fever or chills Gastrointestinal: No reported hemesis, hematochezia, vomiting, or acute GI distress Musculoskeletal: Denies any acute onset joint swelling, redness, loss of ROM, or weakness Neurological: No reported episodes of acute onset apraxia, aphasia, dysarthria,  agnosia, amnesia, paralysis, loss of coordination, or loss of consciousness  Medication Review  Benefiber, Fluticasone-Salmeterol, Magnesium Oxide, PEG-KCl-NaCl-NaSulf-Na Asc-C, albuterol, atenolol, b complex vitamins, busPIRone, dimenhyDRINATE, gabapentin, hydrOXYzine, ipratropium-albuterol, latanoprost, lisinopril, loratadine, lubiprostone, meloxicam, mirabegron ER, montelukast, omeprazole, ondansetron, paliperidone, polyethylene glycol, prazosin, sertraline, traMADol, and traZODone  History Review  Allergy: Rachael West is allergic to latex, penicillins, codeine, cortisone, and sulfa antibiotics. Drug: Rachael West  reports no history of drug use. Alcohol:  reports no history of alcohol use. Tobacco:  reports that she quit smoking about 31 years ago. Her smoking use included cigarettes. She has a 30.00 pack-year smoking history. She has never used smokeless tobacco. Social: Rachael West  reports that she quit smoking about 31 years ago. Her smoking use included cigarettes. She has a 30.00 pack-year smoking history. She has never used smokeless tobacco. She reports that she does not drink alcohol and does not use drugs. Medical:  has a past medical history of Anemia, Anxiety, Arthritis, Asthma, Depression, GERD (gastroesophageal reflux disease), Hepatitis-C, History of chemotherapy, Hypertension, Insomnia, Lymphoma ([place]) (12/2013), Personal history of non-Hodgkin lymphomas, and Vitamin D deficiency. Surgical: Rachael West  has a past surgical history that includes Appendectomy; Trigger finger release; Esophagogastroduodenoscopy (01/17/2011); Colonoscopy (01/17/2011); Abdominal hysterectomy; and Breast biopsy (Left, 2015). Family: family history includes Allergies in her mother; Asthma in her mother and sister; Breast cancer in her cousin; Colon cancer in her paternal aunt; Diabetes in her maternal grandfather and maternal grandmother; Heart disease in her brother, father, paternal  grandfather, and paternal grandmother; Liver  cancer in her paternal uncle; Ovarian cancer in an other family member; Pancreatic cancer in an other family member.  Laboratory Chemistry Profile   Renal Lab Results  Component Value Date   BUN 6 (L) 01/27/2021   CREATININE 0.71 01/27/2021   BCR 8 (L) 01/27/2021   GFRAA >60 08/05/2019   GFRNONAA >60 08/12/2020    Hepatic Lab Results  Component Value Date   AST 23 01/27/2021   ALT 14 01/27/2021   ALBUMIN 4.3 01/27/2021   ALKPHOS 102 01/27/2021   HCVAB REACTIVE (A) 01/12/2014   LIPASE 34 10/31/2010    Electrolytes Lab Results  Component Value Date   NA 141 01/27/2021   K 4.4 01/27/2021   CL 102 01/27/2021   CALCIUM 10.1 01/27/2021   MG 2.2 08/29/2017    Bone Lab Results  Component Value Date   25OHVITD1 21 (L) 08/29/2017   25OHVITD2 <1.0 08/29/2017   25OHVITD3 21 08/29/2017    Inflammation (CRP: Acute Phase) (ESR: Chronic Phase) Lab Results  Component Value Date   CRP 12 (H) 08/29/2017   ESRSEDRATE 46 (H) 08/29/2017         Note: Above Lab results reviewed.  Recent Imaging Review  DG PAIN CLINIC C-ARM 1-60 MIN NO REPORT Fluoro was used, but no Radiologist interpretation will be provided.  Please refer to "NOTES" tab for provider progress note. Note: Reviewed        Physical Exam  General appearance: Well nourished, well developed, and well hydrated. In no apparent acute distress Mental status: Alert, oriented x 3 (person, place, & time)       Respiratory: No evidence of acute respiratory distress Eyes: PERLA Vitals: There were no vitals taken for this visit. BMI: Estimated body mass index is 30.12 kg/m as calculated from the following:   Height as of 04/12/21: $RemoveBe'5\' 5"'iAeBvBcRJ$  (1.651 m).   Weight as of 04/12/21: 181 lb (82.1 kg). Ideal: Patient weight not recorded  Assessment   Diagnosis Status  1. Chronic pain syndrome   2. Chronic lower extremity pain (2ry area of Pain) (Left)   3. Chronic upper back pain (3ry area  of Pain) (Bilateral) (L>R)   4. Chronic neck pain (4th area of Pain) (Bilateral) (L>R)   5. Chronic low back pain (Primary Area of Pain) (Bilateral) w/ sciatica (Left)   6. Pharmacologic therapy   7. Chronic use of opiate for therapeutic purpose   8. Encounter for medication management   9. Encounter for chronic pain management    Controlled Controlled Controlled   Updated Problems: No problems updated.  Plan of Care  Problem-specific:  No problem-specific Assessment & Plan notes found for this encounter.  Rachael West has a current medication list which includes the following long-term medication(s): albuterol, atenolol, fluticasone-salmeterol, gabapentin, ipratropium-albuterol, lisinopril, loratadine, lubiprostone, magnesium oxide, meloxicam, mirabegron er, montelukast, omeprazole, paliperidone, prazosin, sertraline, tramadol, trazodone, and benefiber.  Pharmacotherapy (Medications Ordered): No orders of the defined types were placed in this encounter.  Orders:  No orders of the defined types were placed in this encounter.  Follow-up plan:   No follow-ups on file.     Interventional Therapies  Risk  Complexity Considerations:   Estimated body mass index is 31.77 kg/m as calculated from the following:   Height as of this encounter: 5' 4.5" (1.638 m).   Weight as of this encounter: 188 lb (85.3 kg). NOTE: LATEX ALLERGY & Hx Hepatitis C (w/ cirrhosis)   Planned  Pending:      Under  consideration:   Therapeutic right cervical ESI #3  Therapeutic right Racz procedure #1  Diagnostic right suprascapular NB #1  Therapeutic bilateral lumbar facet RFA #1 (starting on left side)  Therapeutic bilateral cervical facet RFA #1 (starting on left side)    Completed:   Therapeutic right glenohumeral/AC  inj. x3 (09/23/2020) (100/100/80/80)  Therapeutic bilateral lumbar facet MBB x3 (06/03/2019) (100/100/90/90)  Diagnostic bilateral cervical facet MBB x1 (11/06/2017)  (100/100/90)  Diagnostic left caudal ESI x1 (08/14/2019) (100/100/100 x8 days/90-100)  Therapeutic right cervical ESI x3 (04/12/2021) (100/100/80-95/80-95)    Therapeutic  Palliative (PRN) options:   Therapeutic/palliative glenohumeral/AC  inj.   Therapeutic/palliative lumbar facet MBB   Therapeutic/palliative cervical facet MBB   Therapeutic/palliative caudal ESI   Therapeutic/palliative cervical ESI     Recent Visits Date Type Provider Dept  05/03/21 Office Visit Milinda Pointer, MD [facility]  04/12/21 Procedure visit Milinda Pointer, MD [facility]  Showing recent visits within past 90 days and meeting all other requirements Future Appointments Date Type Provider Dept  06/27/21 Appointment Milinda Pointer, MD [facility]  Showing future appointments within next 90 days and meeting all other requirements  I discussed the assessment and treatment plan with the patient. The patient was provided an opportunity to ask questions and all were answered. The patient agreed with the plan and demonstrated an understanding of the instructions.  Patient advised to call back or seek an in-person evaluation if the symptoms or condition worsens.  Duration of encounter: *** minutes.  Note by: Gaspar Cola, MD Date: 06/27/2021; Time: 11:09 AM

## 2021-06-27 ENCOUNTER — Other Ambulatory Visit: Payer: Self-pay

## 2021-06-27 ENCOUNTER — Ambulatory Visit: Payer: Medicare Other | Attending: Pain Medicine | Admitting: Pain Medicine

## 2021-06-27 ENCOUNTER — Encounter: Payer: Self-pay | Admitting: Pain Medicine

## 2021-06-27 VITALS — BP 132/79 | HR 79 | Temp 97.0°F | Resp 16 | Ht 65.0 in | Wt 189.0 lb

## 2021-06-27 DIAGNOSIS — M549 Dorsalgia, unspecified: Secondary | ICD-10-CM | POA: Diagnosis not present

## 2021-06-27 DIAGNOSIS — G894 Chronic pain syndrome: Secondary | ICD-10-CM

## 2021-06-27 DIAGNOSIS — Z79891 Long term (current) use of opiate analgesic: Secondary | ICD-10-CM | POA: Diagnosis present

## 2021-06-27 DIAGNOSIS — M5442 Lumbago with sciatica, left side: Secondary | ICD-10-CM | POA: Insufficient documentation

## 2021-06-27 DIAGNOSIS — M47812 Spondylosis without myelopathy or radiculopathy, cervical region: Secondary | ICD-10-CM

## 2021-06-27 DIAGNOSIS — M542 Cervicalgia: Secondary | ICD-10-CM | POA: Diagnosis not present

## 2021-06-27 DIAGNOSIS — M50122 Cervical disc disorder at C5-C6 level with radiculopathy: Secondary | ICD-10-CM

## 2021-06-27 DIAGNOSIS — M4802 Spinal stenosis, cervical region: Secondary | ICD-10-CM

## 2021-06-27 DIAGNOSIS — M79605 Pain in left leg: Secondary | ICD-10-CM | POA: Insufficient documentation

## 2021-06-27 DIAGNOSIS — G8929 Other chronic pain: Secondary | ICD-10-CM

## 2021-06-27 DIAGNOSIS — Z79899 Other long term (current) drug therapy: Secondary | ICD-10-CM

## 2021-06-27 DIAGNOSIS — G4486 Cervicogenic headache: Secondary | ICD-10-CM

## 2021-06-27 DIAGNOSIS — M5030 Other cervical disc degeneration, unspecified cervical region: Secondary | ICD-10-CM

## 2021-06-27 MED ORDER — HYDROXYZINE HCL 50 MG PO TABS: 50.0000 mg | ORAL_TABLET | Freq: Every day | ORAL | 0 refills | Status: DC

## 2021-06-27 MED ORDER — ALBUTEROL SULFATE HFA 108 (90 BASE) MCG/ACT IN AERS: INHALATION_SPRAY | RESPIRATORY_TRACT | 0 refills | Status: DC

## 2021-06-27 MED ORDER — TRAMADOL HCL 50 MG PO TABS: 50.0000 mg | ORAL_TABLET | Freq: Four times a day (QID) | ORAL | 5 refills | Status: DC | PRN

## 2021-06-27 NOTE — Progress Notes (Signed)
Nursing Pain Medication Assessment:  Safety precautions to be maintained throughout the outpatient stay will include: orient to surroundings, keep bed in low position, maintain call bell within reach at all times, provide assistance with transfer out of bed and ambulation.  Medication Inspection Compliance: Pill count conducted under aseptic conditions, in front of the patient. Neither the pills nor the bottle was removed from the patient's sight at any time. Once count was completed pills were immediately returned to the patient in their original bottle.  Medication: Tramadol (Ultram) Pill/Patch Count:  7 of 170 pills remain Pill/Patch Appearance: Markings consistent with prescribed medication Bottle Appearance: Standard pharmacy container. Clearly labeled. Filled Date: 03 / 28 / 2023 Last Medication intake:  Yesterday

## 2021-06-27 NOTE — Patient Instructions (Addendum)
______________________________________________________________________  Preparing for Procedure with Sedation  NOTICE: Due to recent regulatory changes, starting on August 09, 2020, procedures requiring intravenous (IV) sedation will no longer be performed at the [facility].  These types of procedures are required to be performed at [facility] ambulatory surgery facility.  We are very sorry for the inconvenience.  Procedure appointments are limited to planned procedures: No Prescription Refills. No disability issues will be discussed. No medication changes will be discussed.  Instructions: Oral Intake: Do not eat or drink anything for at least 8 hours prior to your procedure. (Exception: Blood Pressure Medication. See below.) Transportation: A driver is required. You may not drive yourself after the procedure. Blood Pressure Medicine: Do not forget to take your blood pressure medicine with a sip of water the morning of the procedure. If your Diastolic (lower reading) is above 100 mmHg, elective cases will be cancelled/rescheduled. Blood thinners: These will need to be stopped for procedures. Notify our staff if you are taking any blood thinners. Depending on which one you take, there will be specific instructions on how and when to stop it. Diabetics on insulin: Notify the staff so that you can be scheduled 1st case in the morning. If your diabetes requires high dose insulin, take only  of your normal insulin dose the morning of the procedure and notify the staff that you have done so. Preventing infections: Shower with an antibacterial soap the morning of your procedure. Build-up your immune system: Take 1000 mg of Vitamin C with every meal (3 times a day) the day prior to your procedure. Antibiotics: Inform the staff if you have a condition or reason that requires you to take antibiotics before dental procedures. Pregnancy: If you are pregnant, call and cancel the procedure. Sickness: If  you have a cold, fever, or any active infections, call and cancel the procedure. Arrival: You must be in the facility at least 30 minutes prior to your scheduled procedure. Children: Do not bring children with you. Dress appropriately: There is always the possibility that your clothing may get soiled. Valuables: Do not bring any jewelry or valuables.  Reasons to call and reschedule or cancel your procedure: (Following these recommendations will minimize the risk of a serious complication.) Surgeries: Avoid having procedures within 2 weeks of any surgery. (Avoid for 2 weeks before or after any surgery). Flu Shots: Avoid having procedures within 2 weeks of a flu shots. (Avoid for 2 weeks before or after immunizations). Barium: Avoid having a procedure within 7-10 days after having had a radiological study involving the use of radiological contrast. (Myelograms, Barium swallow or enema study). Heart attacks: Avoid any elective procedures or surgeries for the initial 6 months after a "Myocardial Infarction" (Heart Attack). Blood thinners: It is imperative that you stop these medications before procedures. Let us know if you if you take any blood thinner.  Infection: Avoid procedures during or within two weeks of an infection (including chest colds or gastrointestinal problems). Symptoms associated with infections include: Localized redness, fever, chills, night sweats or profuse sweating, burning sensation when voiding, cough, congestion, stuffiness, runny nose, sore throat, diarrhea, nausea, vomiting, cold or Flu symptoms, recent or current infections. It is specially important if the infection is over the area that we intend to treat. Heart and lung problems: Symptoms that may suggest an active cardiopulmonary problem include: cough, chest pain, breathing difficulties or shortness of breath, dizziness, ankle swelling, uncontrolled high or unusually low blood pressure, and/or palpitations. If you are    experiencing any of these symptoms, cancel your procedure and contact your primary care physician for an evaluation.  Remember:  Regular Business hours are:  Monday to Thursday 8:00 AM to 4:00 PM  Provider's Schedule: Francisco Naveira, MD:  Procedure days: Tuesday and Thursday 7:30 AM to 4:00 PM  Bilal Lateef, MD:  Procedure days: Monday and Wednesday 7:30 AM to 4:00 PM ______________________________________________________________________  ____________________________________________________________________________________________  General Risks and Possible Complications  Patient Responsibilities: It is important that you read this as it is part of your informed consent. It is our duty to inform you of the risks and possible complications associated with treatments offered to you. It is your responsibility as a patient to read this and to ask questions about anything that is not clear or that you believe was not covered in this document.  Patient's Rights: You have the right to refuse treatment. You also have the right to change your mind, even after initially having agreed to have the treatment done. However, under this last option, if you wait until the last second to change your mind, you may be charged for the materials used up to that point.  Introduction: Medicine is not an exact science. Everything in Medicine, including the lack of treatment(s), carries the potential for danger, harm, or loss (which is by definition: Risk). In Medicine, a complication is a secondary problem, condition, or disease that can aggravate an already existing one. All treatments carry the risk of possible complications. The fact that a side effects or complications occurs, does not imply that the treatment was conducted incorrectly. It must be clearly understood that these can happen even when everything is done following the highest safety standards.  No treatment: You can choose not to proceed with the  proposed treatment alternative. The "PRO(s)" would include: avoiding the risk of complications associated with the therapy. The "CON(s)" would include: not getting any of the treatment benefits. These benefits fall under one of three categories: diagnostic; therapeutic; and/or palliative. Diagnostic benefits include: getting information which can ultimately lead to improvement of the disease or symptom(s). Therapeutic benefits are those associated with the successful treatment of the disease. Finally, palliative benefits are those related to the decrease of the primary symptoms, without necessarily curing the condition (example: decreasing the pain from a flare-up of a chronic condition, such as incurable terminal cancer).  General Risks and Complications: These are associated to most interventional treatments. They can occur alone, or in combination. They fall under one of the following six (6) categories: no benefit or worsening of symptoms; bleeding; infection; nerve damage; allergic reactions; and/or death. No benefits or worsening of symptoms: In Medicine there are no guarantees, only probabilities. No healthcare provider can ever guarantee that a medical treatment will work, they can only state the probability that it may. Furthermore, there is always the possibility that the condition may worsen, either directly, or indirectly, as a consequence of the treatment. Bleeding: This is more common if the patient is taking a blood thinner, either prescription or over the counter (example: Goody Powders, Fish oil, Aspirin, Garlic, etc.), or if suffering a condition associated with impaired coagulation (example: Hemophilia, cirrhosis of the liver, low platelet counts, etc.). However, even if you do not have one on these, it can still happen. If you have any of these conditions, or take one of these drugs, make sure to notify your treating physician. Infection: This is more common in patients with a compromised  immune system, either due to disease (example:   diabetes, cancer, human immunodeficiency virus [HIV], etc.), or due to medications or treatments (example: therapies used to treat cancer and rheumatological diseases). However, even if you do not have one on these, it can still happen. If you have any of these conditions, or take one of these drugs, make sure to notify your treating physician. Nerve Damage: This is more common when the treatment is an invasive one, but it can also happen with the use of medications, such as those used in the treatment of cancer. The damage can occur to small secondary nerves, or to large primary ones, such as those in the spinal cord and brain. This damage may be temporary or permanent and it may lead to impairments that can range from temporary numbness to permanent paralysis and/or brain death. Allergic Reactions: Any time a substance or material comes in contact with our body, there is the possibility of an allergic reaction. These can range from a mild skin rash (contact dermatitis) to a severe systemic reaction (anaphylactic reaction), which can result in death. Death: In general, any medical intervention can result in death, most of the time due to an unforeseen complication. ____________________________________________________________________________________________ Epidural Steroid Injection Patient Information  Description: The epidural space surrounds the nerves as they exit the spinal cord.  In some patients, the nerves can be compressed and inflamed by a bulging disc or a tight spinal canal (spinal stenosis).  By injecting steroids into the epidural space, we can bring irritated nerves into direct contact with a potentially helpful medication.  These steroids act directly on the irritated nerves and can reduce swelling and inflammation which often leads to decreased pain.  Epidural steroids may be injected anywhere along the spine and from the neck to the low back  depending upon the location of your pain.   After numbing the skin with local anesthetic (like Novocaine), a small needle is passed into the epidural space slowly.  You may experience a sensation of pressure while this is being done.  The entire block usually last less than 10 minutes.  Conditions which may be treated by epidural steroids:  Low back and leg pain Neck and arm pain Spinal stenosis Post-laminectomy syndrome Herpes zoster (shingles) pain Pain from compression fractures  Preparation for the injection:  Do not eat any solid food or dairy products within 8 hours of your appointment.  You may drink clear liquids up to 3 hours before appointment.  Clear liquids include water, black coffee, juice or soda.  No milk or cream please. You may take your regular medication, including pain medications, with a sip of water before your appointment  Diabetics should hold regular insulin (if taken separately) and take 1/2 normal NPH dos the morning of the procedure.  Carry some sugar containing items with you to your appointment. A driver must accompany you and be prepared to drive you home after your procedure.  Bring all your current medications with your. An IV may be inserted and sedation may be given at the discretion of the physician.   A blood pressure cuff, EKG and other monitors will often be applied during the procedure.  Some patients may need to have extra oxygen administered for a short period. You will be asked to provide medical information, including your allergies, prior to the procedure.  We must know immediately if you are taking blood thinners (like Coumadin/Warfarin)  Or if you are allergic to IV iodine contrast (dye). We must know if you could possible be pregnant.  Possible  side-effects: Bleeding from needle site Infection (rare, may require surgery) Nerve injury (rare) Numbness & tingling (temporary) Difficulty urinating (rare, temporary) Spinal headache ( a headache  worse with upright posture) Light -headedness (temporary) Pain at injection site (several days) Decreased blood pressure (temporary) Weakness in arm/leg (temporary) Pressure sensation in back/neck (temporary)  Call if you experience: Fever/chills associated with headache or increased back/neck pain. Headache worsened by an upright position. New onset weakness or numbness of an extremity below the injection site Hives or difficulty breathing (go to the emergency room) Inflammation or drainage at the infection site Severe back/neck pain Any new symptoms which are concerning to you  Please note:  Although the local anesthetic injected can often make your back or neck feel good for several hours after the injection, the pain will likely return.  It takes 3-7 days for steroids to work in the epidural space.  You may not notice any pain relief for at least that one week.  If effective, we will often do a series of three injections spaced 3-6 weeks apart to maximally decrease your pain.  After the initial series, we generally will wait several months before considering a repeat injection of the same type.  If you have any questions, please call [phone]  [facility] 

## 2021-06-28 ENCOUNTER — Ambulatory Visit
Admission: RE | Admit: 2021-06-28 | Discharge: 2021-06-28 | Disposition: A | Discharge status: Home / Self Care | Payer: Medicare Other | Source: Ambulatory Visit | Attending: Physician Assistant | Admitting: Physician Assistant

## 2021-06-28 DIAGNOSIS — Z1231 Encounter for screening mammogram for malignant neoplasm of breast: Secondary | ICD-10-CM

## 2021-06-30 LAB — TOXASSURE SELECT 13 (MW), URINE

## 2021-07-06 ENCOUNTER — Other Ambulatory Visit: Payer: Self-pay | Admitting: Physician Assistant

## 2021-07-15 ENCOUNTER — Other Ambulatory Visit: Payer: Self-pay | Admitting: Pain Medicine

## 2021-07-15 ENCOUNTER — Other Ambulatory Visit: Payer: Self-pay | Admitting: Physician Assistant

## 2021-07-15 DIAGNOSIS — M797 Fibromyalgia: Secondary | ICD-10-CM

## 2021-07-15 DIAGNOSIS — J4520 Mild intermittent asthma, uncomplicated: Secondary | ICD-10-CM

## 2021-07-15 DIAGNOSIS — G8929 Other chronic pain: Secondary | ICD-10-CM

## 2021-07-15 DIAGNOSIS — R208 Other disturbances of skin sensation: Secondary | ICD-10-CM

## 2021-07-15 DIAGNOSIS — E559 Vitamin D deficiency, unspecified: Secondary | ICD-10-CM

## 2021-07-15 DIAGNOSIS — M1991 Primary osteoarthritis, unspecified site: Secondary | ICD-10-CM

## 2021-07-15 DIAGNOSIS — R209 Unspecified disturbances of skin sensation: Secondary | ICD-10-CM

## 2021-07-15 DIAGNOSIS — Z79899 Other long term (current) drug therapy: Secondary | ICD-10-CM

## 2021-07-17 NOTE — Progress Notes (Unsigned)
PROVIDER NOTE: Interpretation of information contained herein should be left to medically-trained personnel. Specific patient instructions are provided elsewhere under "Patient Instructions" section of medical record. This document was created in part using STT-dictation technology, any transcriptional errors that may result from this process are unintentional.  Patient: Rachael West Type: Established DOB: February 24, 1955 MRN: [id number] PCP: Rachael Reid, PA-C  Service: Procedure DOS: 07/19/2021 Setting: Ambulatory Location: Ambulatory outpatient facility Delivery: Face-to-face Provider: Gaspar Cola, MD Specialty: Interventional Pain Management Specialty designation: 09 Location: Outpatient facility Ref. Prov.: Rachael Pointer, MD   Procedure [facility] Interventional Pain Management )   Type: Cervical Epidural Steroid injection (ESI) (Interlaminar) #1  Laterality: Left  Level: C7-T1 Imaging: Fluoroscopy-assisted DOS: 07/19/2021  Performed by: Rachael Pointer, MD Anesthesia: Local anesthesia (1-2% Lidocaine) Anxiolysis: None                 Sedation: None.   Purpose: Diagnostic/Therapeutic Indications: Cervicalgia, cervical radicular pain, degenerative disc disease, severe enough to impact quality of life or function. No diagnosis found. NAS-11 score:   Pre-procedure:  /10   Post-procedure:  /10      Pre-Procedure Preparation  Monitoring: As per clinic protocol. Respiration, ETCO2, SpO2, BP, heart rate and rhythm monitor placed and checked for adequate function  Risk Assessment: Vitals:  UXN:[id number] body mass index is 31.45 kg/m as calculated from the following:   Height as of 06/27/21: '5\' 5"'$  (1.651 m).   Weight as of 06/27/21: 189 lb (85.7 kg)., Rate:  , BP: , Resp: , Temp: , SpO2:   Allergies: She is allergic to latex, penicillins, codeine, cortisone, and sulfa antibiotics.  Precautions: None required  Blood-thinner(s): None at this time  Coagulopathies:  Reviewed. None identified.   Active Infection(s): Reviewed. None identified. Rachael West is afebrile   Location setting: Procedure suite Position: Prone, on modified reverse trendelenburg to facilitate breathing, with head in head-cradle. Pillows positioned under chest (below chin-level) with cervical spine flexed. Safety Precautions: Patient was assessed for positional comfort and pressure points before starting the procedure. Prepping solution: DuraPrep (Iodine Povacrylex [0.7% available iodine] and Isopropyl Alcohol, 74% w/w) Prep Area: Entire  cervicothoracic region Approach: percutaneous, paramedial Intended target: Posterior cervical epidural space Materials: Tray: Epidural Needle(s): Epidural (Tuohy) Qty: 1 Length: (59m) 3.5-inch Gauge: 17G   No orders of the defined types were placed in this encounter.   No orders of the defined types were placed in this encounter.    Time-out:   I initiated and conducted the "Time-out" before starting the procedure, as per protocol. The patient was asked to participate by confirming the accuracy of the "Time Out" information. Verification of the correct person, site, and procedure were performed and confirmed by me, the nursing staff, and the patient. "Time-out" conducted as per Joint Commission's Universal Protocol (UP.01.01.01). Procedure checklist: Completed   H&P (Pre-op  Assessment)  Ms. MLewanis a 66y.o. (year old), female patient, seen today for interventional treatment. She  has a past surgical history that includes Appendectomy; Trigger finger release; Esophagogastroduodenoscopy (01/17/2011); Colonoscopy (01/17/2011); Abdominal hysterectomy; and Breast biopsy (Left, 2015). Ms. MBlacksonhas a current medication list which includes the following prescription(s): albuterol, amlodipine, atenolol, b complex vitamins, buspirone, buspirone, dimenhydrinate, fluticasone-salmeterol, gabapentin, hydroxyzine, ipratropium-albuterol,  culturelle, latanoprost, lisinopril, loratadine, lubiprostone, magnesium oxide -mg supplement, meloxicam, montelukast, omeprazole, ondansetron, paliperidone, polyethylene glycol, prazosin, sertraline, [START ON 07/30/2021] tramadol, trazodone, and benefiber. Her primarily concern today is the No chief complaint on file.  She is allergic to latex, penicillins, codeine, cortisone, and  sulfa antibiotics.   Last encounter: My last encounter with her was on 07/15/2021. Pertinent problems: Rachael West has Large B-cell lymphoma ([place]); Atypical chest pain; Chronic low back pain (Primary Area of Pain) (Bilateral) w/ sciatica (Left); Chronic lower extremity pain (2ry area of Pain) (Left); Chronic upper back pain (3ry area of Pain) (Bilateral) (L>R); Chronic neck pain (4th area of Pain) (Bilateral) (L>R); Occipital headache; Chronic knee pain (Bilateral) (R>L); Chronic pain syndrome; Chronic sacroiliac joint pain; DDD (degenerative disc disease), lumbar; Lumbar facet arthropathy; Lumbar facet syndrome (Bilateral) (L>R); Spondylosis without myelopathy or radiculopathy, lumbar region; DDD (degenerative disc disease), thoracic; Thoracolumbar dextroscoliosis; Cervicogenic headache; Tendinitis of rotator cuff (Left); Tendinitis of rotator cuff (Right); Fibromyalgia; Chronic shoulder pain (Bilateral) (R>L); Chronic hip pain (Bilateral) (L>R); Cervicalgia; Chronic idiopathic gout; DDD (degenerative disc disease), cervical; 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); Cervical facet syndrome (Bilateral) (R>L); Spondylosis without myelopathy or radiculopathy, cervical region; Chronic musculoskeletal pain; Chronic low back pain (1ry area of Pain) (Bilateral) (L>R) w/o sciatica; Cervical disc disorder at C5-C6 level with radiculopathy (Right); Cervical radiculopathy at C6 (Right); Osteoarthrosis of multiple sites; Radicular pain of upper extremity (Right); Chronic upper extremity pain (Right); Chronic shoulder pain  (Right); Arthralgia of shoulder region (Right); Shoulder enthesopathy (Right); Musculoskeletal disorder involving upper trapezius muscle (Right); Abnormal MRI, cervical spine (11/12/2019); Chronic neuropathic pain; Disturbance of skin sensation; Hyperalgesia; Trigger finger, middle finger (Right); and Trigger point of shoulder region (Right) on their pertinent problem list. Pain Assessment: Severity of   is reported as a  /10. Location:    / . Onset:  . Quality:  . Timing:  . Modifying factor(s):  Marland Kitchen Vitals:  vitals were not taken for this visit.   Reason for encounter: Interventional pain management therapy due pain of at least four (4) weeks in duration, with to failure to respond to and/or inability to tolerate more conservative care.   Site Confirmation: Ms. Plasse was asked to confirm the procedure and laterality before marking the site.  Consent: Before the procedure and under the influence of no sedative(s), amnesic(s), or anxiolytics, the patient was informed of the treatment options, risks and possible complications. To fulfill our ethical and legal obligations, as recommended by the American Medical Association's Code of Ethics, I have informed the patient of my clinical impression; the nature and purpose of the treatment or procedure; the risks, benefits, and possible complications of the intervention; the alternatives, including doing nothing; the risk(s) and benefit(s) of the alternative treatment(s) or procedure(s); and the risk(s) and benefit(s) of doing nothing. The patient was provided information about the general risks and possible complications associated with the procedure. These may include, but are not limited to: failure to achieve desired goals, infection, bleeding, organ or nerve damage, allergic reactions, paralysis, and death. In addition, the patient was informed of those risks and complications associated to Spine-related procedures, such as failure to decrease pain;  infection (i.e.: Meningitis, epidural or intraspinal abscess); bleeding (i.e.: epidural hematoma, subarachnoid hemorrhage, or any other type of intraspinal or peri-dural bleeding); organ or nerve damage (i.e.: Any type of peripheral nerve, nerve root, or spinal cord injury) with subsequent damage to sensory, motor, and/or autonomic systems, resulting in permanent pain, numbness, and/or weakness of one or several areas of the body; allergic reactions; (i.e.: anaphylactic reaction); and/or death. Furthermore, the patient was informed of those risks and complications associated with the medications. These include, but are not limited to: allergic reactions (i.e.: anaphylactic or anaphylactoid reaction(s)); adrenal axis suppression; blood sugar  elevation that in diabetics may result in ketoacidosis or comma; water retention that in patients with history of congestive heart failure may result in shortness of breath, pulmonary edema, and decompensation with resultant heart failure; weight gain; swelling or edema; medication-induced neural toxicity; particulate matter embolism and blood vessel occlusion with resultant organ, and/or nervous system infarction; and/or aseptic necrosis of one or more joints. Finally, the patient was informed that Medicine is not an exact science; therefore, there is also the possibility of unforeseen or unpredictable risks and/or possible complications that may result in a catastrophic outcome. The patient indicated having understood very clearly. We have given the patient no guarantees and we have made no promises. Enough time was given to the patient to ask questions, all of which were answered to the patient's satisfaction. Ms. Faught has indicated that she wanted to continue with the procedure. Attestation: I, the ordering provider, attest that I have discussed with the patient the benefits, risks, side-effects, alternatives, likelihood of achieving goals, and potential problems  during recovery for the procedure that I have provided informed consent.  Date  Time: {[facility]-PAIN TIME CHOICES:21018001}   Prophylactic antibiotics  Anti-infectives (From admission, onward)    None      Indication(s): None identified   Description of procedure   Start Time:   hrs  Local Anesthesia: Once the patient was positioned, prepped, and time-out was completed. The target area was identified located. The skin was marked with an approved surgical skin marker. Once marked, the skin (epidermis, dermis, and hypodermis), and deeper tissues (fat, connective tissue and muscle) were infiltrated with a small amount of a short-acting local anesthetic, loaded on a 10cc syringe with a 25G, 1.5-in  Needle. An appropriate amount of time was allowed for local anesthetics to take effect before proceeding to the next step. Local Anesthetic: Lidocaine 1-2% The unused portion of the local anesthetic was discarded in the proper designated containers. Safety Precautions: Aspiration looking for blood return was conducted prior to all injections. At no point did I inject any substances, as a needle was being advanced. Before injecting, the patient was told to immediately notify me if she was experiencing any new onset of "ringing in the ears, or metallic taste in the mouth". No attempts were made at seeking any paresthesias. Safe injection practices and needle disposal techniques used. Medications properly checked for expiration dates. SDV (single dose vial) medications used. After the completion of the procedure, all disposable equipment used was discarded in the proper designated medical waste containers.  Technical description: Protocol guidelines were followed. Using fluoroscopic guidance, the epidural needle was introduced through the skin, ipsilateral to the reported pain, and advanced to the target area. Posterior laminar os was contacted and the needle walked caudad, until the lamina was cleared. The  ligamentum flavum was engaged and the epidural space identified using "loss-of-resistance technique" with 2-3 ml of PF-NaCl (0.9% NSS), in a 5cc dedicated LOR syringe. See "Imaging guidance" below for use of contrast details.  Injection: Once satisfactory needle placement was confirmed, I proceeded to inject the desired solution in slow, incremental fashion, intermittently assessing for discomfort or any signs of abnormal or undesired spread of substance. Once completed, the needle was removed and disposed of, as per hospital protocols.   There were no vitals filed for this visit.  End Time:   hrs  Once the entire procedure was completed, the treated area was cleaned, making sure to leave some of the prepping solution back to take advantage  of its long term bactericidal properties.   Imaging guidance  Type of Imaging Technique: Fluoroscopy Guidance (Spinal) Indication(s): Assistance in needle guidance and placement for procedures requiring needle placement in or near specific anatomical locations not easily accessible without such assistance. Exposure Time: Please see nurses notes for exact fluoroscopy time. Contrast: Before injecting any contrast, we confirmed that the patient did not have an allergy to iodine, shellfish, or radiological contrast. Once satisfactory needle placement was completed, radiological contrast was injected under continuous fluoroscopic guidance. Injection of contrast accomplished without complications. See chart for type and volume of contrast used. Fluoroscopic Guidance: I was personally present in the fluoroscopy suite, where the patient was placed in position for the procedure, over the fluoroscopy-compatible table. Fluoroscopy was manipulated, using "Tunnel Vision Technique", to obtain the best possible view of the target area, on the affected side. Parallax error was corrected before commencing the procedure. A "direction-depth-direction" technique was used to introduce  the needle under continuous pulsed fluoroscopic guidance. Once the target was reached, antero-posterior, oblique, and lateral fluoroscopic projection views were taken to confirm needle placement in all planes. Electronic images uploaded into EMR.  Interpretation: Successful epidural injection. Intraoperative imaging interpretation by performing Physician.    Post-op assessment  Post-procedure Vital Signs:  Pulse/HCG Rate:    Temp:   Resp:   BP:   SpO2:    EBL: None  Complications: No immediate post-treatment complications observed by team, or reported by patient.  Note: The patient tolerated the entire procedure well. A repeat set of vitals were taken after the procedure and the patient was kept under observation following institutional policy, for this type of procedure. Post-procedural neurological assessment was performed, showing return to baseline, prior to discharge. The patient was provided with post-procedure discharge instructions, including a section on how to identify potential problems. Should any problems arise concerning this procedure, the patient was given instructions to immediately contact us, at any time, without hesitation. In any case, we plan to contact the patient by telephone for a follow-up status report regarding this interventional procedure.  Comments:  No additional relevant information.   Plan of care  Chronic Opioid Analgesic:  Tramadol 50 mg, 2 tabs (100 mg) PO q 6 hrs (400 mg/day of tramadol) MME/day: 40 mg/day.   Medications administered: [name] had no medications administered during this visit.  Follow-up plan:   No follow-ups on file.      Interventional Therapies  Risk  Complexity Considerations:   Estimated body mass index is 31.77 kg/m as calculated from the following:   Height as of this encounter: 5' 4.5" (1.638 m).   Weight as of this encounter: 188 lb (85.3 kg). NOTE: LATEX ALLERGY & Hx Hepatitis C (w/ cirrhosis)    Planned  Pending:   Diagnostic/therapeutic left cervical ESI #1    Under consideration:   Diagnostic/therapeutic left cervical ESI #1  Diagnostic/therapeutic left shoulder injection #1  Therapeutic left caudal ESI #2  Therapeutic right cervical ESI #3  Therapeutic right Racz procedure #1  Diagnostic right suprascapular NB #1  Therapeutic bilateral lumbar facet RFA #1 (starting on left side)  Therapeutic bilateral cervical facet RFA #1 (starting on left side)    Completed:   Therapeutic right glenohumeral/AC  inj. x3 (09/23/2020) (100/100/80/80)  Therapeutic bilateral lumbar facet MBB x3 (06/03/2019) (100/100/90/90)  Diagnostic bilateral cervical facet MBB x1 (11/06/2017) (100/100/90)  Diagnostic left caudal ESI x1 (08/14/2019) (100/100/100 x8 days/90-100)  Therapeutic right cervical ESI x3 (04/12/2021) (100/100/80-95/80-95)    Therapeutic  Palliative (PRN) options:   Therapeutic/palliative glenohumeral/AC  inj.   Therapeutic/palliative lumbar facet MBB   Therapeutic/palliative cervical facet MBB   Therapeutic/palliative caudal ESI   Therapeutic/palliative cervical ESI      Recent Visits Date Type Provider Dept  06/27/21 Office Visit Rachael Pointer, MD [facility]  05/03/21 Office Visit Rachael Pointer, MD [facility]  Showing recent visits within past 90 days and meeting all other requirements Future Appointments Date Type Provider Dept  07/19/21 Appointment Rachael Pointer, MD [facility]  09/26/21 Appointment Rachael Pointer, MD [facility]  Showing future appointments within next 90 days and meeting all other requirements   Disposition: Discharge home  Discharge (Date  Time): 07/19/2021;   hrs.   Primary Care Physician: Rachael Reid, PA-C Location: [facility] Note by: Rachael Cola, MD Date: 07/19/2021; Time: 3:29 PM  DISCLAIMER: Medicine is not an Chief Strategy Officer. It has no  guarantees or warranties. The decision to proceed with this intervention was based on the information collected from the patient. Conclusions were drawn from the patient's questionnaire, interview, and examination. Because information was provided in large part by the patient, it cannot be guaranteed that it has not been purposely or unconsciously manipulated or altered. Every effort has been made to obtain as much accurate, relevant, available data as possible. Always take into account that the treatment will also be dependent on availability of resources and existing treatment guidelines, considered by other Pain Management Specialists as being common knowledge and practice, at the time of the intervention. It is also important to point out that variation in procedural techniques and pharmacological choices are the acceptable norm. For Medico-Legal review purposes, the indications, contraindications, technique, and results of the these procedures should only be evaluated, judged and interpreted by a Board-Certified Interventional Pain Specialist with extensive familiarity and expertise in the same exact procedure and technique.

## 2021-07-19 ENCOUNTER — Encounter: Payer: Self-pay | Admitting: Pain Medicine

## 2021-07-19 ENCOUNTER — Ambulatory Visit: Payer: Medicare Other | Attending: Pain Medicine | Admitting: Pain Medicine

## 2021-07-19 ENCOUNTER — Ambulatory Visit
Admission: RE | Admit: 2021-07-19 | Discharge: 2021-07-19 | Disposition: A | Discharge status: Home / Self Care | Payer: Medicare Other | Source: Ambulatory Visit | Attending: Pain Medicine | Admitting: Pain Medicine

## 2021-07-19 VITALS — BP 142/81 | HR 80 | Temp 97.4°F | Resp 18 | Ht 64.0 in | Wt 189.0 lb

## 2021-07-19 DIAGNOSIS — M5412 Radiculopathy, cervical region: Secondary | ICD-10-CM | POA: Diagnosis present

## 2021-07-19 DIAGNOSIS — G4486 Cervicogenic headache: Secondary | ICD-10-CM | POA: Insufficient documentation

## 2021-07-19 DIAGNOSIS — G8929 Other chronic pain: Secondary | ICD-10-CM | POA: Insufficient documentation

## 2021-07-19 DIAGNOSIS — M549 Dorsalgia, unspecified: Secondary | ICD-10-CM | POA: Insufficient documentation

## 2021-07-19 DIAGNOSIS — M542 Cervicalgia: Secondary | ICD-10-CM | POA: Insufficient documentation

## 2021-07-19 DIAGNOSIS — Z5189 Encounter for other specified aftercare: Secondary | ICD-10-CM | POA: Insufficient documentation

## 2021-07-19 DIAGNOSIS — M4802 Spinal stenosis, cervical region: Secondary | ICD-10-CM | POA: Diagnosis present

## 2021-07-19 DIAGNOSIS — M5030 Other cervical disc degeneration, unspecified cervical region: Secondary | ICD-10-CM | POA: Insufficient documentation

## 2021-07-19 DIAGNOSIS — M50122 Cervical disc disorder at C5-C6 level with radiculopathy: Secondary | ICD-10-CM | POA: Diagnosis present

## 2021-07-19 DIAGNOSIS — M47812 Spondylosis without myelopathy or radiculopathy, cervical region: Secondary | ICD-10-CM | POA: Diagnosis present

## 2021-07-19 DIAGNOSIS — I8500 Esophageal varices without bleeding: Secondary | ICD-10-CM | POA: Insufficient documentation

## 2021-07-19 DIAGNOSIS — Z91040 Latex allergy status: Secondary | ICD-10-CM | POA: Diagnosis present

## 2021-07-19 DIAGNOSIS — E161 Other hypoglycemia: Secondary | ICD-10-CM | POA: Insufficient documentation

## 2021-07-19 MED ORDER — DEXAMETHASONE SODIUM PHOSPHATE 10 MG/ML IJ SOLN
INTRAMUSCULAR | Status: AC
  Filled 2021-07-19: qty 1

## 2021-07-19 MED ORDER — MIDAZOLAM HCL 5 MG/5ML IJ SOLN
0.5000 mg | Freq: Once | INTRAMUSCULAR | Status: AC
  Administered 2021-07-19: 3 mg via INTRAVENOUS

## 2021-07-19 MED ORDER — LATANOPROST 0.005 % OP SOLN: 1.0000 [drp] | Freq: Every day | OPHTHALMIC | 0 refills | Status: DC

## 2021-07-19 MED ORDER — IOHEXOL 180 MG/ML  SOLN
INTRAMUSCULAR | Status: AC
  Filled 2021-07-19: qty 10

## 2021-07-19 MED ORDER — LACTATED RINGERS IV SOLN: Freq: Once | INTRAVENOUS | Status: AC

## 2021-07-19 MED ORDER — LIDOCAINE HCL (PF) 2 % IJ SOLN
INTRAMUSCULAR | Status: AC
  Filled 2021-07-19: qty 5

## 2021-07-19 MED ORDER — IOHEXOL 180 MG/ML  SOLN
10.0000 mL | Freq: Once | INTRAMUSCULAR | Status: AC
  Administered 2021-07-19: 10 mL via EPIDURAL

## 2021-07-19 MED ORDER — LORATADINE 10 MG PO TABS: 10.0000 mg | ORAL_TABLET | Freq: Every day | ORAL | 0 refills | Status: DC

## 2021-07-19 MED ORDER — FENTANYL CITRATE (PF) 100 MCG/2ML IJ SOLN
INTRAMUSCULAR | Status: AC
  Filled 2021-07-19: qty 2

## 2021-07-19 MED ORDER — ROPIVACAINE HCL 2 MG/ML IJ SOLN
INTRAMUSCULAR | Status: AC
  Filled 2021-07-19: qty 20

## 2021-07-19 MED ORDER — SODIUM CHLORIDE 0.9% FLUSH
1.0000 mL | Freq: Once | INTRAVENOUS | Status: AC
  Administered 2021-07-19: 1 mL

## 2021-07-19 MED ORDER — MIDAZOLAM HCL 5 MG/5ML IJ SOLN
INTRAMUSCULAR | Status: AC
  Filled 2021-07-19: qty 5

## 2021-07-19 MED ORDER — SODIUM CHLORIDE (PF) 0.9 % IJ SOLN
INTRAMUSCULAR | Status: AC
  Filled 2021-07-19: qty 10

## 2021-07-19 MED ORDER — PENTAFLUOROPROP-TETRAFLUOROETH EX AERO: INHALATION_SPRAY | Freq: Once | CUTANEOUS | Status: AC

## 2021-07-19 MED ORDER — LIDOCAINE HCL 2 % IJ SOLN
20.0000 mL | Freq: Once | INTRAMUSCULAR | Status: AC
  Administered 2021-07-19: 100 mg

## 2021-07-19 MED ORDER — MELOXICAM 15 MG PO TABS: 15.0000 mg | ORAL_TABLET | Freq: Every day | ORAL | 0 refills | Status: DC

## 2021-07-19 MED ORDER — BUSPIRONE HCL 10 MG PO TABS: 10.0000 mg | ORAL_TABLET | Freq: Three times a day (TID) | ORAL | 0 refills | Status: DC

## 2021-07-19 MED ORDER — ALBUTEROL SULFATE HFA 108 (90 BASE) MCG/ACT IN AERS: INHALATION_SPRAY | RESPIRATORY_TRACT | 0 refills | Status: DC

## 2021-07-19 MED ORDER — FENTANYL CITRATE (PF) 100 MCG/2ML IJ SOLN: 25.0000 ug | INTRAMUSCULAR | Status: DC | PRN

## 2021-07-19 MED ORDER — IPRATROPIUM-ALBUTEROL 0.5-2.5 (3) MG/3ML IN SOLN: RESPIRATORY_TRACT | 0 refills | Status: DC

## 2021-07-19 MED ORDER — DEXAMETHASONE SODIUM PHOSPHATE 10 MG/ML IJ SOLN
10.0000 mg | Freq: Once | INTRAMUSCULAR | Status: AC
  Administered 2021-07-19: 10 mg

## 2021-07-19 MED ORDER — DEXTROSE 5 % IV SOLN: INTRAVENOUS | Status: DC

## 2021-07-19 MED ORDER — ROPIVACAINE HCL 2 MG/ML IJ SOLN
1.0000 mL | Freq: Once | INTRAMUSCULAR | Status: AC
  Administered 2021-07-19: 1 mL via EPIDURAL

## 2021-07-19 NOTE — Patient Instructions (Signed)
____________________________________________________________________________________________  Virtual Visits   What is a "Virtual Visit"? It is a healthcare communication encounter (medical visit) that takes place on real time (NOT TEXT or E-MAIL) over the telephone or computer device (desktop, laptop, tablet, smart phone, etc.). It allows for more location flexibility between the patient and the healthcare provider.  Who decides when these types of visits will be used? The physician.  Who is eligible for these types of visits? Only those patients that can be reliably reached over the telephone.  What do you mean by reliably? We do not have time to call everyone multiple times, therefore those that tend to screen calls and then call back later are not suitable candidates for this system. We understand how people are reluctant to pickup on "unknown" calls, therefore, we suggest adding our telephone numbers to your list of "CONTACT(s)". This way, you should be able to readily identify our calls when you receive one. All of our numbers are available below.   Who is not eligible? This option is not available for medication management encounters, specially for controlled substances. Patients on pain medications that fall under the category of controlled substances have to come in for "Face-to-Face" encounters. This is required for mandatory monitoring of these substances. You may be asked to provide a sample for an unannounced urine drug screening test (UDS), and we will need to count your pain pills. Not bringing your pills to be counted may result in no refill. Obviously, neither one of these can be done over the phone.  When will this type of visits be used? You can request a virtual visit whenever you are physically unable to attend a regular appointment. The decision will be made by the physician (or healthcare provider) on a case by case basis.   At what time will I be called? This is an  excellent question. The providers will try to call you whenever they have time available. Do not expect to be called at any specific time. The secretaries will assign you a time for your virtual visit appointment, but this is done simply to keep a list of those patients that need to be called, but not for the purpose of keeping a time schedule. Be advised that the call may come in anytime during the day, between the hours of 8:00 AM and 8::00 PM, depending on provider availability. We do understand that the system is not perfect. If you are unable to be available that day on a moments notice, then request an "in-person" appointment rather than a "virtual visit".  Can I request my medication visits to be "Virtual"? Yes you may request it, but the decision is entirely up to the healthcare provider. Control substances require specific monitoring that requires Face-to-Face encounters. The number of encounters  and the extent of the monitoring is determined on a case by case basis.  Add a new contact to your smart phone and label it "PAIN CLINIC" Under this contact add the following numbers: Main: [phone] (Official Contact Number) Nurses: [phone] (These are outgoing only calling systems. Do not call this number.) Dr. Kelsa Jaworowski: [phone] or [phone] (Outgoing calls only. Do not call this number.)  ____________________________________________________________________________________________   ____________________________________________________________________________________________  Post-Procedure Discharge Instructions  Instructions: Apply ice:  Purpose: This will minimize any swelling and discomfort after procedure.  When: Day of procedure, as soon as you get home. How: Fill a plastic sandwich bag with crushed ice. Cover it with a small towel and apply to   injection site. How long: (15 min on, 15 min off) Apply for 15 minutes then remove x 15 minutes.  Repeat sequence on day  of procedure, until you go to bed. Apply heat:  Purpose: To treat any soreness and discomfort from the procedure. When: Starting the next day after the procedure. How: Apply heat to procedure site starting the day following the procedure. How long: May continue to repeat daily, until discomfort goes away. Food intake: Start with clear liquids (like water) and advance to regular food, as tolerated.  Physical activities: Keep activities to a minimum for the first 8 hours after the procedure. After that, then as tolerated. Driving: If you have received any sedation, be responsible and do not drive. You are not allowed to drive for 24 hours after having sedation. Blood thinner: (Applies only to those taking blood thinners) You may restart your blood thinner 6 hours after your procedure. Insulin: (Applies only to Diabetic patients taking insulin) As soon as you can eat, you may resume your normal dosing schedule. Infection prevention: Keep procedure site clean and dry. Shower daily and clean area with soap and water. Post-procedure Pain Diary: Extremely important that this be done correctly and accurately. Recorded information will be used to determine the next step in treatment. For the purpose of accuracy, follow these rules: Evaluate only the area treated. Do not report or include pain from an untreated area. For the purpose of this evaluation, ignore all other areas of pain, except for the treated area. After your procedure, avoid taking a long nap and attempting to complete the pain diary after you wake up. Instead, set your alarm clock to go off every hour, on the hour, for the initial 8 hours after the procedure. Document the duration of the numbing medicine, and the relief you are getting from it. Do not go to sleep and attempt to complete it later. It will not be accurate. If you received sedation, it is likely that you were given a medication that may cause amnesia. Because of this, completing  the diary at a later time may cause the information to be inaccurate. This information is needed to plan your care. Follow-up appointment: Keep your post-procedure follow-up evaluation appointment after the procedure (usually 2 weeks for most procedures, 6 weeks for radiofrequencies). DO NOT FORGET to bring you pain diary with you.   Expect: (What should I expect to see with my procedure?) From numbing medicine (AKA: Local Anesthetics): Numbness or decrease in pain. You may also experience some weakness, which if present, could last for the duration of the local anesthetic. Onset: Full effect within 15 minutes of injected. Duration: It will depend on the type of local anesthetic used. On the average, 1 to 8 hours.  From steroids (Applies only if steroids were used): Decrease in swelling or inflammation. Once inflammation is improved, relief of the pain will follow. Onset of benefits: Depends on the amount of swelling present. The more swelling, the longer it will take for the benefits to be seen. In some cases, up to 10 days. Duration: Steroids will stay in the system x 2 weeks. Duration of benefits will depend on multiple posibilities including persistent irritating factors. Side-effects: If present, they may typically last 2 weeks (the duration of the steroids). Frequent: Cramps (if they occur, drink Gatorade and take over-the-counter Magnesium 450-500 mg once to twice a day); water retention with temporary weight gain; increases in blood sugar; decreased immune system response; increased appetite. Occasional: Facial flushing (red,   warm cheeks); mood swings; menstrual changes. Uncommon: Long-term decrease or suppression of natural hormones; bone thinning. (These are more common with higher doses or more frequent use. This is why we prefer that our patients avoid having any injection therapies in other practices.)  Very Rare: Severe mood changes; psychosis; aseptic necrosis. From procedure: Some  discomfort is to be expected once the numbing medicine wears off. This should be minimal if ice and heat are applied as instructed.  Call if: (When should I call?) You experience numbness and weakness that gets worse with time, as opposed to wearing off. New onset bowel or bladder incontinence. (Applies only to procedures done in the spine)  Emergency Numbers: Durning business hours (Monday - Thursday, 8:00 AM - 4:00 PM) (Friday, 9:00 AM - 12:00 Noon): [phone] After hours: [phone] NOTE: If you are having a problem and are unable connect with, or to talk to a provider, then go to your nearest urgent care or emergency department. If the problem is serious and urgent, please call 911. ____________________________________________________________________________________________   

## 2021-07-20 ENCOUNTER — Telehealth: Payer: Self-pay | Admitting: *Deleted

## 2021-07-20 NOTE — Telephone Encounter (Signed)
No problems post procedure. 

## 2021-07-21 ENCOUNTER — Other Ambulatory Visit: Payer: Self-pay | Admitting: Physician Assistant

## 2021-07-21 DIAGNOSIS — M1991 Primary osteoarthritis, unspecified site: Secondary | ICD-10-CM

## 2021-07-22 ENCOUNTER — Other Ambulatory Visit: Payer: Self-pay | Admitting: Physician Assistant

## 2021-07-22 DIAGNOSIS — M1991 Primary osteoarthritis, unspecified site: Secondary | ICD-10-CM

## 2021-07-22 DIAGNOSIS — I10 Essential (primary) hypertension: Secondary | ICD-10-CM

## 2021-08-01 ENCOUNTER — Encounter: Payer: Self-pay | Admitting: Pain Medicine

## 2021-08-01 ENCOUNTER — Telehealth: Payer: Self-pay

## 2021-08-01 NOTE — Progress Notes (Unsigned)
Patient: Rachael West  Service Category: E/M  Provider: Gaspar Cola, MD  DOB: May 11, 1955  DOS: 08/02/2021  Location: Office  MRN: [id number]  Setting: Ambulatory outpatient  Referring Provider: Lorrene Reid, PA-C  Type: Established Patient  Specialty: Interventional Pain Management  PCP: Lorrene Reid, PA-C  Location: Remote location  Delivery: TeleHealth     Virtual Encounter - Pain Management PROVIDER NOTE: Information contained herein reflects review and annotations entered in association with encounter. Interpretation of such information and data should be left to medically-trained personnel. Information provided to patient can be located elsewhere in the medical record under "Patient Instructions". Document created using STT-dictation technology, any transcriptional errors that may result from process are unintentional.    Contact & Pharmacy Preferred: [phone] Home: [phone] (home) Mobile: [phone] (mobile) E-mail: [email or web address]  [id number] [address], [place] [address] Phone: [phone] Fax: [phone], [address] [place] [address] Phone: [phone] Fax: [phone]   Pre-screening  Rachael West offered "in-person" vs "virtual" encounter. She indicated preferring virtual for this encounter.   Reason COVID-19*  Social distancing based on CDC and AMA recommendations.   I contacted Rachael West on 08/02/2021 via telephone.      I clearly identified myself as Gaspar Cola, MD. I verified that I was speaking with the correct person using two identifiers (Name: Rachael West, and date of birth: May 16, 1955).  Consent I sought verbal advanced consent from Rachael West for virtual visit interactions. I informed Rachael West of possible  security and privacy concerns, risks, and limitations associated with providing "not-in-person" medical evaluation and management services. I also informed Rachael West of the availability of "in-person" appointments. Finally, I informed her that there would be a charge for the virtual visit and that she could be  personally, fully or partially, financially responsible for it. Rachael West expressed understanding and agreed to proceed.   Historic Elements   Rachael West is a 66 y.o. year old, female patient evaluated today after our last contact on 07/19/2021. Rachael West  has a past medical history of Anemia, Anxiety, Arthritis, Asthma, Depression, GERD (gastroesophageal reflux disease), Hepatitis-C, History of chemotherapy, Hypertension, Insomnia, Lymphoma ([place]) (12/2013), Personal history of non-Hodgkin lymphomas, and Vitamin D deficiency. She also  has a past surgical history that includes Appendectomy; Trigger finger release; Esophagogastroduodenoscopy (01/17/2011); Colonoscopy (01/17/2011); Abdominal hysterectomy; and Breast biopsy (Left, 2015). Rachael West has a current medication list which includes the following prescription(s): albuterol, amlodipine, atenolol, b complex vitamins, buspirone, buspirone, dimenhydrinate, fluticasone-salmeterol, hydroxyzine, ipratropium-albuterol, culturelle, latanoprost, lisinopril, loratadine, magnesium oxide -mg supplement, meloxicam, montelukast, omeprazole, ondansetron, paliperidone, polyethylene glycol, prazosin, sertraline, tramadol, trazodone, gabapentin, lubiprostone, and benefiber. She  reports that she quit smoking about 31 years ago. Her smoking use included cigarettes. She has a 30.00 pack-year smoking history. She has never used smokeless tobacco. She reports that she does not drink alcohol and does not use drugs. Rachael West is allergic to latex, penicillins, codeine, cortisone, and sulfa antibiotics.   HPI  Today, she is  being contacted for a post-procedure assessment.  Post-procedure evaluation   Type: Cervical Epidural Steroid injection (ESI) (Interlaminar) #1  Laterality: Left  Level: C7-T1 Imaging: Fluoroscopy-assisted DOS: 07/19/2021  Performed by: Milinda Pointer, MD Anesthesia: Local anesthesia (1-2% Lidocaine) Anxiolysis: IV Versed         Sedation: Moderate conscious sedation.  Purpose: Diagnostic/Therapeutic Indications: Cervicalgia, cervical radicular pain, degenerative disc disease, severe enough to impact quality of life or function. 1. Cervical radiculopathy at C6   2. Cervical disc disorder at C5-C6 level with radiculopathy   3. 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 Cervicalgia   5. DDD (degenerative disc disease), cervical   6. Cervicogenic headache    Latex precautions, history of latex allergy    NAS-11 score:   Pre-procedure: 8 /10   Post-procedure: 0-No pain/10      Effectiveness:  Initial hour after procedure: 100 %. Subsequent 4-6 hours post-procedure: 100 %. Analgesia past initial 6 hours: 100 % (current.). Ongoing improvement:  Analgesic: The patient indicates having an ongoing 100% relief of her neck pain, shoulder pain, headaches, and upper extremity symptoms. Function: Rachael West reports improvement in function ROM: Rachael West reports improvement in ROM  Pharmacotherapy Assessment   Opioid Analgesic: Tramadol 50 mg, 2 tabs (100 mg) PO q 6 hrs (400 mg/day of tramadol) MME/day: 40 mg/day.   Monitoring:  PMP: PDMP reviewed during this encounter.       Pharmacotherapy: No side-effects or adverse reactions reported. Compliance: No problems identified. Effectiveness: Clinically acceptable. Plan: Refer to "POC". UDS:  Summary  Date Value Ref Range Status  06/27/2021 Note  Final    Comment:    ==================================================================== [employer] Select 13  (MW) ==================================================================== Test                             Result       Flag       Units  Drug Present and Declared for Prescription Verification   Tramadol                       >5435        EXPECTED   ng/mg creat   O-Desmethyltramadol            >5435        EXPECTED   ng/mg creat   N-Desmethyltramadol            >5435        EXPECTED   ng/mg creat    Source of tramadol is a prescription medication. O-desmethyltramadol    and N-desmethyltramadol are expected metabolites of tramadol.  ==================================================================== Test                      Result    Flag   Units      Ref Range   Creatinine              92               mg/dL      >=20 ==================================================================== Declared Medications:  The flagging and interpretation on this report are based on the  following declared medications.  Unexpected results may arise from  inaccuracies in the declared medications.   **Note: The testing scope of this panel includes these medications:   Tramadol (Ultram)   **Note: The testing scope of this panel does not include the  following reported medications:   Albuterol (Ventolin HFA)  Albuterol (Duoneb)  Amlodipine (Norvasc)  Atenolol (Tenormin)  Buspirone (Buspar)  Dimenhydrinate  Fluticasone (Advair)  Gabapentin (Neurontin)  Hydroxyzine (Atarax)  Ipratropium (Duoneb)  Latanoprost (Xalatan)  Lisinopril (Zestril)  Loratadine (Claritin)  Lubiprostone (Amitiza)  Magnesium (Mag-Ox)  Meloxicam (Mobic)  Montelukast (Singulair)  Omeprazole (Prilosec)  Ondansetron (  Zofran)  Paliperidone (Invega)  Polyethylene Glycol (MiraLAX)  Prazosin (Minipress)  Probiotic  Salmeterol (Advair)  Sertraline (Zoloft)  Supplement  Trazodone (Desyrel)  Vitamin B ==================================================================== For clinical consultation, please call [phone]. ====================================================================    No results found for: "CBDTHCR", "D8THCCBX", "D9THCCBX"   Laboratory Chemistry Profile   Renal Lab Results  Component Value Date   BUN 6 (L) 01/27/2021   CREATININE 0.71 01/27/2021   BCR 8 (L) 01/27/2021   GFRAA >60 08/05/2019   GFRNONAA >60 08/12/2020    Hepatic Lab Results  Component Value Date   AST 23 01/27/2021   ALT 14 01/27/2021   ALBUMIN 4.3 01/27/2021   ALKPHOS 102 01/27/2021   HCVAB REACTIVE (A) 01/12/2014   LIPASE 34 10/31/2010    Electrolytes Lab Results  Component Value Date   NA 141 01/27/2021   K 4.4 01/27/2021   CL 102 01/27/2021   CALCIUM 10.1 01/27/2021   MG 2.2 08/29/2017    Bone Lab Results  Component Value Date   25OHVITD1 21 (L) 08/29/2017   25OHVITD2 <1.0 08/29/2017   25OHVITD3 21 08/29/2017    Inflammation (CRP: Acute Phase) (ESR: Chronic Phase) Lab Results  Component Value Date   CRP 12 (H) 08/29/2017   ESRSEDRATE 46 (H) 08/29/2017         Note: Above Lab results reviewed.  Imaging  DG PAIN CLINIC C-ARM 1-60 MIN NO REPORT Fluoro was used, but no Radiologist interpretation will be provided.  Please refer to "NOTES" tab for provider progress note.  Assessment  The primary encounter diagnosis was Chronic upper back pain (3ry area of Pain) (Bilateral) (L>R). Diagnoses of Cervicalgia, Cervicogenic headache, and Cervical radiculopathy at C6 were also pertinent to this visit.  Plan of Care  Problem-specific:  No problem-specific Assessment & Plan notes found for this encounter.  Rachael West has a current medication list which includes the following long-term medication(s): albuterol, atenolol, fluticasone-salmeterol, ipratropium-albuterol, lisinopril, loratadine, magnesium oxide -mg supplement, meloxicam, montelukast, omeprazole, paliperidone, prazosin, sertraline, tramadol, trazodone, gabapentin, lubiprostone, and  benefiber.  Pharmacotherapy (Medications Ordered): No orders of the defined types were placed in this encounter.  Orders:  No orders of the defined types were placed in this encounter.  Follow-up plan:   Return for scheduled encounter.     Interventional Therapies  Risk  Complexity Considerations:   Estimated body mass index is 31.77 kg/m as calculated from the following:   Height as of this encounter: 5' 4.5" (1.638 m).   Weight as of this encounter: 188 lb (85.3 kg). NOTE: LATEX ALLERGY & Hx Hepatitis C (w/ cirrhosis)   Planned  Pending:      Under consideration:   Diagnostic/therapeutic left cervical ESI #2  Diagnostic/therapeutic left shoulder injection #1  Therapeutic left caudal ESI #2  Therapeutic right cervical ESI #3  Therapeutic right Racz procedure #1  Diagnostic right suprascapular NB #1  Therapeutic bilateral lumbar facet RFA #1 (starting on left side)  Therapeutic bilateral cervical facet RFA #1 (starting on left side)    Completed:   Therapeutic right glenohumeral/AC  inj. x3 (09/23/2020) (100/100/80/80)  Therapeutic bilateral lumbar facet MBB x3 (06/03/2019) (100/100/90/90)  Diagnostic bilateral cervical facet MBB x1 (11/06/2017) (100/100/90)  Diagnostic left caudal ESI x1 (08/14/2019) (100/100/100 x8 days/90-100)  Therapeutic right cervical ESI x3 (04/12/2021) (100/100/80-95/80-95)  Therapeutic left cervical ESI x1 (07/19/2021) (100/100/100/100) (helped neck, shoulders, headache, and arms)    Therapeutic  Palliative (PRN) options:   Therapeutic/palliative glenohumeral/AC  inj.   Therapeutic/palliative lumbar facet  MBB   Therapeutic/palliative cervical facet MBB   Therapeutic/palliative caudal ESI   Therapeutic/palliative cervical ESI     Recent Visits Date Type Provider Dept  07/19/21 Procedure visit Milinda Pointer, MD [facility]  06/27/21 Office Visit Milinda Pointer, MD [facility]  Showing recent visits within past 90 days  and meeting all other requirements Today's Visits Date Type Provider Dept  08/02/21 Office Visit Milinda Pointer, MD [facility]  Showing today's visits and meeting all other requirements Future Appointments Date Type Provider Dept  09/26/21 Appointment Milinda Pointer, MD [facility]  Showing future appointments within next 90 days and meeting all other requirements  I discussed the assessment and treatment plan with the patient. The patient was provided an opportunity to ask questions and all were answered. The patient agreed with the plan and demonstrated an understanding of the instructions.  Patient advised to call back or seek an in-person evaluation if the symptoms or condition worsens.  Duration of encounter: 12 minutes.  Note by: Gaspar Cola, MD Date: 08/02/2021; Time: 1:40 PM

## 2021-08-02 ENCOUNTER — Ambulatory Visit: Payer: Medicare Other | Attending: Pain Medicine | Admitting: Pain Medicine

## 2021-08-02 DIAGNOSIS — G4486 Cervicogenic headache: Secondary | ICD-10-CM

## 2021-08-02 DIAGNOSIS — M542 Cervicalgia: Secondary | ICD-10-CM

## 2021-08-02 DIAGNOSIS — M549 Dorsalgia, unspecified: Secondary | ICD-10-CM

## 2021-08-02 DIAGNOSIS — G8929 Other chronic pain: Secondary | ICD-10-CM

## 2021-08-02 DIAGNOSIS — M5412 Radiculopathy, cervical region: Secondary | ICD-10-CM | POA: Diagnosis not present

## 2021-08-18 ENCOUNTER — Other Ambulatory Visit: Payer: Self-pay | Admitting: Physician Assistant

## 2021-08-18 DIAGNOSIS — J4520 Mild intermittent asthma, uncomplicated: Secondary | ICD-10-CM

## 2021-08-31 ENCOUNTER — Other Ambulatory Visit: Payer: Self-pay | Admitting: Physician Assistant

## 2021-08-31 ENCOUNTER — Other Ambulatory Visit: Payer: Self-pay | Admitting: Pain Medicine

## 2021-08-31 DIAGNOSIS — Z79899 Other long term (current) drug therapy: Secondary | ICD-10-CM

## 2021-08-31 DIAGNOSIS — M7918 Myalgia, other site: Secondary | ICD-10-CM

## 2021-08-31 DIAGNOSIS — E559 Vitamin D deficiency, unspecified: Secondary | ICD-10-CM

## 2021-08-31 DIAGNOSIS — J4520 Mild intermittent asthma, uncomplicated: Secondary | ICD-10-CM

## 2021-08-31 DIAGNOSIS — M797 Fibromyalgia: Secondary | ICD-10-CM

## 2021-08-31 DIAGNOSIS — R208 Other disturbances of skin sensation: Secondary | ICD-10-CM

## 2021-08-31 DIAGNOSIS — R209 Unspecified disturbances of skin sensation: Secondary | ICD-10-CM

## 2021-08-31 DIAGNOSIS — M1991 Primary osteoarthritis, unspecified site: Secondary | ICD-10-CM

## 2021-08-31 DIAGNOSIS — G8929 Other chronic pain: Secondary | ICD-10-CM

## 2021-08-31 MED ORDER — LATANOPROST 0.005 % OP SOLN: 1.0000 [drp] | Freq: Every day | OPHTHALMIC | 0 refills | Status: DC

## 2021-08-31 MED ORDER — CULTURELLE PO CAPS: 1.0000 | ORAL_CAPSULE | Freq: Every day | ORAL | 0 refills | Status: DC

## 2021-08-31 MED ORDER — MAGNESIUM OXIDE -MG SUPPLEMENT 500 MG PO CAPS: 1.0000 | ORAL_CAPSULE | Freq: Two times a day (BID) | ORAL | 0 refills | Status: AC

## 2021-08-31 MED ORDER — MELOXICAM 15 MG PO TABS: 15.0000 mg | ORAL_TABLET | Freq: Every day | ORAL | 0 refills | Status: DC

## 2021-08-31 MED ORDER — LORATADINE 10 MG PO TABS: 10.0000 mg | ORAL_TABLET | Freq: Every day | ORAL | 0 refills | Status: DC

## 2021-08-31 MED ORDER — AMLODIPINE BESYLATE 5 MG PO TABS: 5.0000 mg | ORAL_TABLET | Freq: Every day | ORAL | 0 refills | Status: DC

## 2021-08-31 MED ORDER — ALBUTEROL SULFATE HFA 108 (90 BASE) MCG/ACT IN AERS: INHALATION_SPRAY | RESPIRATORY_TRACT | 0 refills | Status: DC

## 2021-09-18 ENCOUNTER — Other Ambulatory Visit: Payer: Self-pay | Admitting: Physician Assistant

## 2021-09-18 DIAGNOSIS — J4520 Mild intermittent asthma, uncomplicated: Secondary | ICD-10-CM

## 2021-09-24 ENCOUNTER — Other Ambulatory Visit: Payer: Self-pay | Admitting: Physician Assistant

## 2021-09-26 ENCOUNTER — Encounter: Payer: Medicare Other | Admitting: Pain Medicine

## 2021-09-29 ENCOUNTER — Other Ambulatory Visit (HOSPITAL_COMMUNITY): Payer: Self-pay | Admitting: Gastroenterology

## 2021-09-29 ENCOUNTER — Other Ambulatory Visit: Payer: Self-pay | Admitting: Gastroenterology

## 2021-09-29 DIAGNOSIS — R112 Nausea with vomiting, unspecified: Secondary | ICD-10-CM

## 2021-10-01 DIAGNOSIS — R3915 Urgency of urination: Secondary | ICD-10-CM | POA: Insufficient documentation

## 2021-10-01 DIAGNOSIS — R339 Retention of urine, unspecified: Secondary | ICD-10-CM | POA: Insufficient documentation

## 2021-10-01 DIAGNOSIS — R350 Frequency of micturition: Secondary | ICD-10-CM | POA: Insufficient documentation

## 2021-10-01 DIAGNOSIS — R32 Unspecified urinary incontinence: Secondary | ICD-10-CM | POA: Insufficient documentation

## 2021-10-07 ENCOUNTER — Other Ambulatory Visit: Payer: Self-pay | Admitting: Physician Assistant

## 2021-10-07 DIAGNOSIS — I10 Essential (primary) hypertension: Secondary | ICD-10-CM

## 2021-10-16 ENCOUNTER — Other Ambulatory Visit: Payer: Self-pay | Admitting: Physician Assistant

## 2021-10-16 DIAGNOSIS — J302 Other seasonal allergic rhinitis: Secondary | ICD-10-CM

## 2021-10-16 DIAGNOSIS — J4520 Mild intermittent asthma, uncomplicated: Secondary | ICD-10-CM

## 2021-10-20 ENCOUNTER — Other Ambulatory Visit (HOSPITAL_BASED_OUTPATIENT_CLINIC_OR_DEPARTMENT_OTHER): Payer: Self-pay

## 2021-10-20 MED ORDER — FLUAD QUADRIVALENT 0.5 ML IM PRSY
PREFILLED_SYRINGE | INTRAMUSCULAR | 0 refills | Status: DC
  Filled 2021-10-20: qty 0.5, 1d supply, fill #0

## 2021-10-20 MED ORDER — COVID-19 MRNA 2023-2024 VACCINE (COMIRNATY) 0.3 ML INJECTION
INTRAMUSCULAR | 0 refills | Status: DC
  Filled 2021-10-20: qty 0.3, 1d supply, fill #0

## 2021-10-24 ENCOUNTER — Other Ambulatory Visit: Payer: Self-pay | Admitting: Physician Assistant

## 2021-10-30 ENCOUNTER — Other Ambulatory Visit: Payer: Self-pay | Admitting: Physician Assistant

## 2021-10-30 ENCOUNTER — Other Ambulatory Visit: Payer: Self-pay | Admitting: Nurse Practitioner

## 2021-10-30 DIAGNOSIS — J4520 Mild intermittent asthma, uncomplicated: Secondary | ICD-10-CM

## 2021-11-01 ENCOUNTER — Other Ambulatory Visit: Payer: Self-pay

## 2021-11-01 DIAGNOSIS — J4520 Mild intermittent asthma, uncomplicated: Secondary | ICD-10-CM

## 2021-11-01 MED ORDER — ALBUTEROL SULFATE HFA 108 (90 BASE) MCG/ACT IN AERS: INHALATION_SPRAY | RESPIRATORY_TRACT | 0 refills | Status: DC

## 2021-11-02 ENCOUNTER — Ambulatory Visit (INDEPENDENT_AMBULATORY_CARE_PROVIDER_SITE_OTHER): Payer: Medicare Other | Admitting: Physician Assistant

## 2021-11-02 ENCOUNTER — Encounter: Payer: Self-pay | Admitting: Physician Assistant

## 2021-11-02 VITALS — BP 123/79 | HR 76 | Temp 97.3°F | Ht 65.0 in | Wt 187.0 lb

## 2021-11-02 DIAGNOSIS — Z0000 Encounter for general adult medical examination without abnormal findings: Secondary | ICD-10-CM

## 2021-11-02 DIAGNOSIS — Z780 Asymptomatic menopausal state: Secondary | ICD-10-CM | POA: Diagnosis not present

## 2021-11-02 DIAGNOSIS — Z13820 Encounter for screening for osteoporosis: Secondary | ICD-10-CM

## 2021-11-02 DIAGNOSIS — M1991 Primary osteoarthritis, unspecified site: Secondary | ICD-10-CM

## 2021-11-02 MED ORDER — BUSPIRONE HCL 10 MG PO TABS: 10.0000 mg | ORAL_TABLET | Freq: Three times a day (TID) | ORAL | 0 refills | Status: DC

## 2021-11-02 MED ORDER — FLUTICASONE-SALMETEROL 250-50 MCG/ACT IN AEPB: 1.0000 | INHALATION_SPRAY | Freq: Two times a day (BID) | RESPIRATORY_TRACT | 2 refills | Status: DC

## 2021-11-02 MED ORDER — HYDROXYZINE HCL 50 MG PO TABS: 50.0000 mg | ORAL_TABLET | Freq: Every day | ORAL | 1 refills | Status: DC

## 2021-11-02 MED ORDER — MELOXICAM 15 MG PO TABS: 15.0000 mg | ORAL_TABLET | Freq: Every day | ORAL | 0 refills | Status: DC

## 2021-11-02 MED ORDER — CULTURELLE PO CAPS: 1.0000 | ORAL_CAPSULE | Freq: Every day | ORAL | 2 refills | Status: DC

## 2021-11-02 NOTE — Progress Notes (Unsigned)
Subjective:   Rachael West is a 66 y.o. female who presents for Medicare Annual (Subsequent) preventive examination.  Review of Systems    Referred to PCP       Objective:    There were no vitals filed for this visit. There is no height or weight on file to calculate BMI.     07/19/2021    9:04 AM 03/17/2021    2:18 PM 05/04/2020    8:17 AM 04/15/2020    8:58 AM 04/07/2020    8:07 AM 11/27/2019    9:19 AM 08/07/2019   11:44 AM  Advanced Directives  Does Patient Have a Medical Advance Directive? No No No No No No No  Would patient like information on creating a medical advance directive?       No - Patient declined    Current Medications (verified) Outpatient Encounter Medications as of 11/02/2021  Medication Sig   albuterol (VENTOLIN HFA) 108 (90 Base) MCG/ACT inhaler INHALE 2 PUFFS BY MOUTH INTO THE LUNGS 4 TIMES DAILY   amLODipine (NORVASC) 5 MG tablet TAKE 1 TABLET (5 MG TOTAL) BY MOUTH DAILY.   atenolol (TENORMIN) 25 MG tablet TAKE 1 TABLET BY MOUTH EVERY DAY   b complex vitamins tablet Take 1 tablet by mouth daily.   busPIRone (BUSPAR) 10 MG tablet TAKE 1 TABLET BY MOUTH THREE TIMES A DAY   busPIRone (BUSPAR) 5 MG tablet Take 1 tablet (5 mg total) by mouth 2 (two) times daily.   COVID-19 mRNA vaccine 2023-2024 (COMIRNATY) SUSP injection Inject into the muscle.   dimenhyDRINATE (DRAMAMINE) 50 MG tablet Take 50 mg by mouth every 8 (eight) hours as needed.   Fluticasone-Salmeterol (ADVAIR DISKUS) 250-50 MCG/DOSE AEPB Inhale 1 puff into the lungs 2 (two) times daily.   gabapentin (NEURONTIN) 100 MG capsule Take 2 capsules (200 mg total) by mouth at bedtime. Follow written titration schedule.   hydrOXYzine (ATARAX) 50 MG tablet Take 1 tablet (50 mg total) by mouth at bedtime.   influenza vaccine adjuvanted (FLUAD QUADRIVALENT) 0.5 ML injection Inject into the muscle.   ipratropium-albuterol (DUONEB) 0.5-2.5 (3) MG/3ML SOLN INHALE 3 MLS VIA NEBULIZER   Lactobacillus  Rhamnosus, GG, (CULTURELLE) CAPS Take 1 capsule by mouth daily.   latanoprost (XALATAN) 0.005 % ophthalmic solution Place 1 drop into both eyes at bedtime.   lisinopril (ZESTRIL) 5 MG tablet TAKE 1 TABLET (5 MG TOTAL) BY MOUTH DAILY.   loratadine (CLARITIN) 10 MG tablet Take 1 tablet (10 mg total) by mouth daily.   lubiprostone (AMITIZA) 24 MCG capsule Take 1 capsule (24 mcg total) by mouth 2 (two) times daily with a meal. Swallow the medication whole. Do not break or chew the medication.   Magnesium Oxide -Mg Supplement 500 MG CAPS Take 1 capsule (500 mg total) by mouth in the morning and at bedtime.   meloxicam (MOBIC) 15 MG tablet Take 1 tablet (15 mg total) by mouth daily.   montelukast (SINGULAIR) 10 MG tablet TAKE 1 TABLET BY MOUTH EVERY DAY   omeprazole (PRILOSEC) 40 MG capsule 1 po qd   ondansetron (ZOFRAN) 4 MG tablet TAKE 1 TABLET BY MOUTH 2 TIMES DAILY.   paliperidone (INVEGA) 6 MG 24 hr tablet Take 6 mg by mouth every morning.   polyethylene glycol (MIRALAX / GLYCOLAX) packet Take 17 g by mouth 2 (two) times daily.   prazosin (MINIPRESS) 1 MG capsule Take 2 mg by mouth at bedtime.   sertraline (ZOLOFT) 100 MG tablet Take 200 mg  by mouth every morning.   traMADol (ULTRAM) 50 MG tablet Take 1-2 tablets (50-100 mg total) by mouth every 6 (six) hours as needed for severe pain. Each refill must last 30 days.   traZODone (DESYREL) 50 MG tablet 1 TABLET BY MOUTH AT BEDTIME   Wheat Dextrin (BENEFIBER) POWD Take 6 g by mouth 3 (three) times daily before meals. (2 tsp = 6 g)   No facility-administered encounter medications on file as of 11/02/2021.    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   Personal history of non-Hodgkin lymphomas    Vitamin D deficiency    Past Surgical History:  Procedure Laterality Date   ABDOMINAL HYSTERECTOMY     APPENDECTOMY     BREAST BIOPSY Left 2015   lymphoma   COLONOSCOPY  01/17/2011   Procedure: COLONOSCOPY;  Surgeon: Winfield Cunas., MD;  Location: [facility];  Service: Endoscopy;  Laterality: N/A;   ESOPHAGOGASTRODUODENOSCOPY  01/17/2011   Procedure: ESOPHAGOGASTRODUODENOSCOPY (EGD);  Surgeon: Winfield Cunas., MD;  Location: [facility];  Service: Endoscopy;  Laterality: N/A;   TRIGGER FINGER RELEASE     Family History  Problem Relation Age of Onset   Asthma Mother    Allergies Mother    Asthma Sister    Heart disease Father    Heart disease Brother    Diabetes Maternal Grandmother    Diabetes Maternal Grandfather    Heart disease Paternal Grandmother    Heart disease Paternal Grandfather    Colon cancer Paternal Aunt    Liver cancer Paternal Uncle    Breast cancer Cousin    Ovarian cancer Other        great aunt (m)   Pancreatic cancer Other        great aunt (m)   Stomach cancer Neg Hx    Rectal cancer Neg Hx    Esophageal cancer Neg Hx    Social History   Socioeconomic History   Marital status: Single    Spouse name: Not on file   Number of children: Not on file   Years of education: Not on file   Highest education level: Not on file  Occupational History   Occupation: Disabled  Tobacco Use   Smoking status: Former    Packs/day: 1.50    Years: 20.00    Total pack years: 30.00    Types: Cigarettes    Quit date: 01/09/1990    Years since quitting: 31.8   Smokeless tobacco: Never  Vaping Use   Vaping Use: Never used  Substance and Sexual Activity   Alcohol use: No    Alcohol/week: 0.0 standard drinks of alcohol   Drug use: No   Sexual activity: Not Currently  Other Topics Concern   Not on file  Social History Narrative   Not on file   Social Determinants of Health    Financial Resource Strain: Not on file  Food Insecurity: Not on file  Transportation Needs: Not on file  Physical Activity: Not on file  Stress: Not on file  Social Connections: Not on file    Tobacco Counseling Counseling  given: Not Answered   Clinical Intake:                 Diabetic?no       Activities of Daily Living    01/27/2021   10:52 AM  In your present state of health, do you have any difficulty performing the following activities:  Hearing? 1  Vision? 1  Difficulty concentrating or making decisions? 1  Walking or climbing stairs? 0  Dressing or bathing? 0  Doing errands, shopping? 0    Patient Care Team: Lorrene Reid, PA-C as PCP - General (Physician Assistant) Laurence Spates, MD (Inactive) as Consulting Physician (Gastroenterology) Loletha Carrow Kirke Corin, MD as Consulting Physician (Gastroenterology) Elsie Saas, MD as Consulting Physician (Orthopedic Surgery) Meade Maw, MD as Consulting Physician (Neurosurgery) Milinda Pointer, MD as Consulting Physician (Pain Medicine)  Indicate any recent Medical Services you may have received from other than [facility] providers in the past year (date may be approximate).     Assessment:   This is a routine wellness examination for [place].  Hearing/Vision screen No results found.  Dietary issues and exercise activities discussed:     Goals Addressed   None   Depression Screen    07/19/2021    9:04 AM 03/17/2021    2:18 PM 01/27/2021   10:52 AM 07/29/2020    9:03 AM 04/15/2020    8:58 AM 04/07/2020    8:07 AM 03/25/2020   10:33 AM  PHQ 2/9 Scores  PHQ - 2 Score 0 0 6 4 0 0 2  PHQ- 9 Score   '22 13   8    '$ Fall Risk    07/19/2021    9:03 AM 06/27/2021   10:17 AM 03/17/2021    2:18 PM 01/27/2021   10:52 AM 12/09/2020    2:31 PM  Fall Risk   Falls in the past year? 0 0 0 1 1  Number falls in past yr:  0  1 1  Injury with Fall?    0 0  Risk for fall due to :  Impaired balance/gait   History of fall(s);Impaired balance/gait Impaired balance/gait  Risk for fall due to: Comment     got new hearing aids, She hopes that works for her.  Follow up  Falls evaluation completed;Education provided  Falls evaluation completed Falls prevention discussed    FALL RISK PREVENTION PERTAINING TO THE HOME:  Any stairs in or around the home? yes If so, are there any without handrails? yes Home free of loose throw rugs in walkways, pet beds, electrical cords, etc? No  Adequate lighting in your home to reduce risk of falls? Yes   ASSISTIVE DEVICES UTILIZED TO PREVENT FALLS:  Life alert? Yes  Use of a cane, walker or w/c? Yes  Grab bars in the bathroom? Yes  Shower chair or bench in shower? No  Elevated toilet seat or a handicapped toilet? No   TIMED UP AND GO:  Was the test performed? Yes .  Length of time to ambulate 10 feet: 10 sec.   Gait slow and steady with assistive device  Cognitive Function:        03/25/2020   12:03 PM 05/06/2018   10:17 AM  6CIT Screen  What Year? 0 points 0 points  What month? 0 points 0 points  What time? 0 points 0 points  Count back from 20 0 points 4 points  Months in reverse 0 points 0 points  Repeat phrase  6 points  Total  Score  10 points    Immunizations Immunization History  Administered Date(s) Administered   COVID-19, mRNA, vaccine(Comirnaty)12 years and older 10/20/2021   Fluad Quad(high Dose 65+) 10/20/2021   Influenza, High Dose Seasonal PF 11/24/2018, 11/17/2019   Influenza,inj,Quad PF,6+ Mos 11/06/2014   Influenza-Unspecified 10/09/2013   Pneumococcal Polysaccharide-23 03/09/2015    {TDAP status:2101805}  Flu Vaccine status: Up to date  {Pneumococcal vaccine status:2101807}  Covid-19 vaccine status: Completed vaccines  Qualifies for Shingles Vaccine? Yes   Zostavax completed No   {Shingrix Completed?:2101804}  Screening Tests Health Maintenance  Topic Date Due   COVID-19 Vaccine (1) Never done    TETANUS/TDAP  Never done   Zoster Vaccines- Shingrix (1 of 2) Never done   Pneumonia Vaccine 12+ Years old (2 - PCV) 08/11/2018   DEXA SCAN  Never done   COLONOSCOPY (Pts 45-74yr Insurance coverage will need to be confirmed)  01/16/2021   Medicare Annual Wellness (AWV)  04/24/2021   MAMMOGRAM  06/29/2023   INFLUENZA VACCINE  Completed   Hepatitis C Screening  Completed   HPV VACCINES  Aged Out    Health Maintenance  Health Maintenance Due  Topic Date Due   COVID-19 Vaccine (1) Never done   TETANUS/TDAP  Never done   Zoster Vaccines- Shingrix (1 of 2) Never done   Pneumonia Vaccine 68 Years old (2 - PCV) 08/11/2018   DEXA SCAN  Never done   COLONOSCOPY (Pts 45-422yrInsurance coverage will need to be confirmed)  01/16/2021   Medicare Annual Wellness (AWV)  04/24/2021    Colorectal cancer screening: Type of screening: Colonoscopy. Completed ***. Repeat every 10 years  Mammogram status: Completed 06/28/2021. Repeat every year 2 years  {Bone Density status:21018021}  Lung Cancer Screening: (Low Dose CT Chest recommended if Age 68-80ears, 30 pack-year currently smoking OR have quit w/in 15years.) does not qualify.   Lung Cancer Screening Referral: n/a  Additional Screening:  Hepatitis C Screening: does qualify; Completed ***  Vision Screening: Recommended annual ophthalmology exams for early detection of glaucoma and other disorders of the eye. Is the patient up to date with their annual eye exam?  Yes  Who is the provider or what is the name of the office in which the patient attends annual eye exams? [facility] If pt is not established with a provider, would they like to be referred to a provider to establish care? No .   Dental Screening: Recommended annual dental exams for proper oral hygiene  Community Resource Referral / Chronic Care Management: CRR required this visit?  No   CCM required this visit?  No      Plan:     I have personally reviewed  and noted the following in the patient's chart:   Medical and social history Use of alcohol, tobacco or illicit drugs  Current medications and supplements including opioid prescriptions. Patient is currently taking opioid prescriptions. Information provided to patient regarding non-opioid alternatives. Patient advised to discuss non-opioid treatment plan with their provider. Functional ability and status Nutritional status Physical activity Advanced directives List of other physicians Hospitalizations, surgeries, and ER visits in previous 12 months Vitals Screenings to include cognitive, depression, and falls Referrals and appointments  In addition, I have reviewed and discussed with patient certain preventive protocols, quality metrics, and best practice recommendations. A written personalized care plan for preventive services as well as general preventive health recommendations were provided to patient.

## 2021-11-02 NOTE — Patient Instructions (Signed)
Preventive Care 65 Years and Older, Female Preventive care refers to lifestyle choices and visits with your health care provider that can promote health and wellness. Preventive care visits are also called wellness exams. What can I expect for my preventive care visit? Counseling Your health care provider may ask you questions about your: Medical history, including: Past medical problems. Family medical history. Pregnancy and menstrual history. History of falls. Current health, including: Memory and ability to understand (cognition). Emotional well-being. Home life and relationship well-being. Sexual activity and sexual health. Lifestyle, including: Alcohol, nicotine or tobacco, and drug use. Access to firearms. Diet, exercise, and sleep habits. Work and work environment. Sunscreen use. Safety issues such as seatbelt and bike helmet use. Physical exam Your health care provider will check your: Height and weight. These may be used to calculate your BMI (body mass index). BMI is a measurement that tells if you are at a healthy weight. Waist circumference. This measures the distance around your waistline. This measurement also tells if you are at a healthy weight and may help predict your risk of certain diseases, such as type 2 diabetes and high blood pressure. Heart rate and blood pressure. Body temperature. Skin for abnormal spots. What immunizations do I need?  Vaccines are usually given at various ages, according to a schedule. Your health care provider will recommend vaccines for you based on your age, medical history, and lifestyle or other factors, such as travel or where you work. What tests do I need? Screening Your health care provider may recommend screening tests for certain conditions. This may include: Lipid and cholesterol levels. Hepatitis C test. Hepatitis B test. HIV (human immunodeficiency virus) test. STI (sexually transmitted infection) testing, if you are at  risk. Lung cancer screening. Colorectal cancer screening. Diabetes screening. This is done by checking your blood sugar (glucose) after you have not eaten for a while (fasting). Mammogram. Talk with your health care provider about how often you should have regular mammograms. BRCA-related cancer screening. This may be done if you have a family history of breast, ovarian, tubal, or peritoneal cancers. Bone density scan. This is done to screen for osteoporosis. Talk with your health care provider about your test results, treatment options, and if necessary, the need for more tests. Follow these instructions at home: Eating and drinking  Eat a diet that includes fresh fruits and vegetables, whole grains, lean protein, and low-fat dairy products. Limit your intake of foods with high amounts of sugar, saturated fats, and salt. Take vitamin and mineral supplements as recommended by your health care provider. Do not drink alcohol if your health care provider tells you not to drink. If you drink alcohol: Limit how much you have to 0-1 drink a day. Know how much alcohol is in your drink. In the [place] one drink equals one 12 oz bottle of beer (355 mL), one 5 oz glass of wine (148 mL), or one 1 oz glass of hard liquor (44 mL). Lifestyle Brush your teeth every morning and night with fluoride toothpaste. Floss one time each day. Exercise for at least 30 minutes 5 or more days each week. Do not use any products that contain nicotine or tobacco. These products include cigarettes, chewing tobacco, and vaping devices, such as e-cigarettes. If you need help quitting, ask your health care provider. Do not use drugs. If you are sexually active, practice safe sex. Use a condom or other form of protection in order to prevent STIs. Take aspirin only as told by   your health care provider. Make sure that you understand how much to take and what form to take. Work with your health care provider to find out whether it  is safe and beneficial for you to take aspirin daily. Ask your health care provider if you need to take a cholesterol-lowering medicine (statin). Find healthy ways to manage stress, such as: Meditation, yoga, or listening to music. Journaling. Talking to a trusted person. Spending time with friends and family. Minimize exposure to UV radiation to reduce your risk of skin cancer. Safety Always wear your seat belt while driving or riding in a vehicle. Do not drive: If you have been drinking alcohol. Do not ride with someone who has been drinking. When you are tired or distracted. While texting. If you have been using any mind-altering substances or drugs. Wear a helmet and other protective equipment during sports activities. If you have firearms in your house, make sure you follow all gun safety procedures. What's next? Visit your health care provider once a year for an annual wellness visit. Ask your health care provider how often you should have your eyes and teeth checked. Stay up to date on all vaccines. This information is not intended to replace advice given to you by your health care provider. Make sure you discuss any questions you have with your health care provider. Document Revised: 06/23/2020 Document Reviewed: 06/23/2020 Elsevier Patient Education  2023 [employer]  

## 2021-11-03 ENCOUNTER — Other Ambulatory Visit: Payer: Self-pay | Admitting: Pain Medicine

## 2021-11-03 ENCOUNTER — Other Ambulatory Visit: Payer: Self-pay | Admitting: Physician Assistant

## 2021-11-03 DIAGNOSIS — Z79899 Other long term (current) drug therapy: Secondary | ICD-10-CM

## 2021-11-03 DIAGNOSIS — M797 Fibromyalgia: Secondary | ICD-10-CM

## 2021-11-03 DIAGNOSIS — R209 Unspecified disturbances of skin sensation: Secondary | ICD-10-CM

## 2021-11-03 DIAGNOSIS — R208 Other disturbances of skin sensation: Secondary | ICD-10-CM

## 2021-11-03 DIAGNOSIS — G8929 Other chronic pain: Secondary | ICD-10-CM

## 2021-11-09 ENCOUNTER — Encounter: Payer: Self-pay | Admitting: Physician Assistant

## 2021-11-22 ENCOUNTER — Other Ambulatory Visit: Payer: Self-pay | Admitting: Physician Assistant

## 2021-11-22 ENCOUNTER — Other Ambulatory Visit: Payer: Self-pay | Admitting: Pain Medicine

## 2021-11-22 ENCOUNTER — Other Ambulatory Visit: Payer: Self-pay | Admitting: Nurse Practitioner

## 2021-11-22 DIAGNOSIS — M797 Fibromyalgia: Secondary | ICD-10-CM

## 2021-11-22 DIAGNOSIS — R208 Other disturbances of skin sensation: Secondary | ICD-10-CM

## 2021-11-22 DIAGNOSIS — R209 Unspecified disturbances of skin sensation: Secondary | ICD-10-CM

## 2021-11-22 DIAGNOSIS — G8929 Other chronic pain: Secondary | ICD-10-CM

## 2021-11-22 DIAGNOSIS — Z79899 Other long term (current) drug therapy: Secondary | ICD-10-CM

## 2021-11-22 DIAGNOSIS — J4520 Mild intermittent asthma, uncomplicated: Secondary | ICD-10-CM

## 2021-11-28 NOTE — Progress Notes (Unsigned)
PROVIDER NOTE: Information contained herein reflects review and annotations entered in association with encounter. Interpretation of such information and data should be left to medically-trained personnel. Information provided to patient can be located elsewhere in the medical record under "Patient Instructions". Document created using STT-dictation technology, any transcriptional errors that may result from process are unintentional.    Patient: Rachael West  Service Category: E/M  Provider: Gaspar Cola, MD  DOB: 12-Aug-1955  DOS: 11/30/2021  Referring Provider: Ellouise Newer  MRN: [id number]  Specialty: Interventional Pain Management  PCP: Lorrene Reid, PA-C  Type: Established Patient  Setting: Ambulatory outpatient    Location: Office  Delivery: Face-to-face     HPI  [name], a 66 y.o. year old female, is here today because of her No primary diagnosis found.. Ms. Aristizabal primary complain today is No chief complaint on file. Last encounter: My last encounter with her was on 11/22/2021. Pertinent problems: Ms. Meadow has Large B-cell lymphoma ([place]); Atypical chest pain; Chronic low back pain (Primary Area of Pain) (Bilateral) w/ sciatica (Left); Chronic lower extremity pain (2ry area of Pain) (Left); Chronic upper back pain (3ry area of Pain) (Bilateral) (L>R); Chronic neck pain (4th area of Pain) (Bilateral) (L>R); Occipital headache; Chronic knee pain (Bilateral) (R>L); Chronic pain syndrome; Chronic sacroiliac joint pain; DDD (degenerative disc disease), lumbar; Lumbar facet arthropathy; Lumbar facet syndrome (Bilateral) (L>R); Spondylosis without myelopathy or radiculopathy, lumbar region; DDD (degenerative disc disease), thoracic; Thoracolumbar dextroscoliosis; Cervicogenic headache; Tendinitis of rotator cuff (Left); Tendinitis of rotator cuff (Right); Fibromyalgia; Chronic shoulder pain (Bilateral) (R>L); Chronic hip pain (Bilateral) (L>R);  Cervicalgia; Chronic idiopathic gout; DDD (degenerative disc disease), cervical; 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); Cervical facet syndrome (Bilateral) (R>L); Spondylosis without myelopathy or radiculopathy, cervical region; Chronic musculoskeletal pain; Chronic low back pain (1ry area of Pain) (Bilateral) (L>R) w/o sciatica; Cervical disc disorder at C5-C6 level with radiculopathy; Cervical radiculopathy at C6; Osteoarthrosis of multiple sites; Radicular pain of upper extremity (Right); Chronic upper extremity pain (Right); Chronic shoulder pain (Right); Arthralgia of shoulder region (Right); Shoulder enthesopathy (Right); Musculoskeletal disorder involving upper trapezius muscle (Right); Abnormal MRI, cervical spine (11/12/2019); Chronic neuropathic pain; Disturbance of skin sensation; Hyperalgesia; Trigger finger, middle finger (Right); and Trigger point of shoulder region (Right) on their pertinent problem list. Pain Assessment: Severity of   is reported as a  /10. Location:    / . Onset:  . Quality:  . Timing:  . Modifying factor(s):  Marland Kitchen Vitals:  vitals were not taken for this visit.   Reason for encounter: {Blank single:19197::"medication management","post-procedure evaluation and assessment","both, medication management and post-procedure evaluation and assessment","evaluation of worsening, or previously known (established) problem","patient-requested evaluation","follow-up evaluation","evaluation for possible interventional PM therapy/treatment","new problems","Pre-operative heart & lung assessment. I have discussed with the patient the benefits, risks, side effects, alternatives, likelihood of achieving goals and potential problems associated with the planned procedure. The patient refers understanding. Pre-operative cardiopulmonary assessment shows: Respiratory system: CTA bilaterally, no wheezing, no crackles, normal respiratory effort. Cardiovascular system: regular rate and  rhythm, no murmur"," *** "}. ***  Pharmacotherapy Assessment  Analgesic: Tramadol 50 mg, 2 tabs (100 mg) PO q 6 hrs (400 mg/day of tramadol) MME/day: 40 mg/day.   Monitoring: [place] PMP: PDMP reviewed during this encounter. {Blank single:19197::"Unable to conduct review of the controlled substance reporting system due to technological failure.","     "} Pharmacotherapy: {Blank single:19197::"Opioid-induced constipation (OIC)(K59.03, T40.2X5A)","No side-effects or adverse reactions reported."} Compliance: {Blank single:19197::"Medication agreement violation - unsanctioned  acquisition/use of additional opioid-containing medication","No problems identified."} Effectiveness: {Blank single:19197::"Clinically acceptable."}  No notes on file  No results found for: "CBDTHCR" No results found for: "D8THCCBX" No results found for: "D9THCCBX"  UDS:  Summary  Date Value Ref Range Status  06/27/2021 Note  Final    Comment:    ==================================================================== [employer] Select 13 (MW) ==================================================================== Test                             Result       Flag       Units  Drug Present and Declared for Prescription Verification   Tramadol                       >5435        EXPECTED   ng/mg creat   O-Desmethyltramadol            >5435        EXPECTED   ng/mg creat   N-Desmethyltramadol            >5435        EXPECTED   ng/mg creat    Source of tramadol is a prescription medication. O-desmethyltramadol    and N-desmethyltramadol are expected metabolites of tramadol.  ==================================================================== Test                      Result    Flag   Units      Ref Range   Creatinine              92               mg/dL      >=20 ==================================================================== Declared Medications:  The flagging and interpretation on this report are based on the  following  declared medications.  Unexpected results may arise from  inaccuracies in the declared medications.   **Note: The testing scope of this panel includes these medications:   Tramadol (Ultram)   **Note: The testing scope of this panel does not include the  following reported medications:   Albuterol (Ventolin HFA)  Albuterol (Duoneb)  Amlodipine (Norvasc)  Atenolol (Tenormin)  Buspirone (Buspar)  Dimenhydrinate  Fluticasone (Advair)  Gabapentin (Neurontin)  Hydroxyzine (Atarax)  Ipratropium (Duoneb)  Latanoprost (Xalatan)  Lisinopril (Zestril)  Loratadine (Claritin)  Lubiprostone (Amitiza)  Magnesium (Mag-Ox)  Meloxicam (Mobic)  Montelukast (Singulair)  Omeprazole (Prilosec)  Ondansetron (Zofran)  Paliperidone (Invega)  Polyethylene Glycol (MiraLAX)  Prazosin (Minipress)  Probiotic  Salmeterol (Advair)  Sertraline (Zoloft)  Supplement  Trazodone (Desyrel)  Vitamin B ==================================================================== For clinical consultation, please call [phone]. ====================================================================       ROS  Constitutional: {Blank single:19197::"Denies any fever or chills"} Gastrointestinal: {Blank single:19197::"No reported hemesis, hematochezia, vomiting, or acute GI distress"} Musculoskeletal: {Blank single:19197::"Denies any acute onset joint swelling, redness, loss of ROM, or weakness"} Neurological: {Blank single:19197::"No reported episodes of acute onset apraxia, aphasia, dysarthria, agnosia, amnesia, paralysis, loss of coordination, or loss of consciousness"}  Medication Review  Benefiber, COVID-19 mRNA vaccine 2023-2024, Culturelle, Magnesium Oxide -Mg Supplement, albuterol, amLODipine, atenolol, b complex vitamins, busPIRone, dimenhyDRINATE, fluticasone-salmeterol, gabapentin, hydrOXYzine, influenza vaccine adjuvanted, ipratropium-albuterol, latanoprost, lisinopril, loratadine, lubiprostone,  meloxicam, montelukast, omeprazole, ondansetron, paliperidone, polyethylene glycol, prazosin, sertraline, traMADol, and traZODone  History Review  Allergy: Ms. Kruser is allergic to latex, penicillins, codeine, cortisone, and sulfa antibiotics. Drug: Ms. Uzelac  reports no history of drug use. Alcohol:  reports no history of  alcohol use. Tobacco:  reports that she quit smoking about 31 years ago. Her smoking use included cigarettes. She has a 30.00 pack-year smoking history. She has never used smokeless tobacco. Social: Ms. Rhett  reports that she quit smoking about 31 years ago. Her smoking use included cigarettes. She has a 30.00 pack-year smoking history. She has never used smokeless tobacco. She reports that she does not drink alcohol and does not use drugs. Medical:  has a past medical history of Anemia, Anxiety, Arthritis, Asthma, Depression, GERD (gastroesophageal reflux disease), Hepatitis-C, History of chemotherapy, Hypertension, Insomnia, Lymphoma ([place]) (12/2013), Personal history of non-Hodgkin lymphomas, and Vitamin D deficiency. Surgical: Ms. Bisaillon  has a past surgical history that includes Appendectomy; Trigger finger release; Esophagogastroduodenoscopy (01/17/2011); Colonoscopy (01/17/2011); Abdominal hysterectomy; and Breast biopsy (Left, 2015). Family: family history includes Allergies in her mother; Asthma in her mother and sister; Breast cancer in her cousin; Colon cancer in her paternal aunt; Diabetes in her maternal grandfather and maternal grandmother; Heart disease in her brother, father, paternal grandfather, and paternal grandmother; Liver cancer in her paternal uncle; Ovarian cancer in an other family member; Pancreatic cancer in an other family member.  Laboratory Chemistry Profile   Renal Lab Results  Component Value Date   BUN 6 (L) 01/27/2021   CREATININE 0.71 01/27/2021   BCR 8 (L) 01/27/2021   GFRAA >60 08/05/2019   GFRNONAA >60  08/12/2020    Hepatic Lab Results  Component Value Date   AST 23 01/27/2021   ALT 14 01/27/2021   ALBUMIN 4.3 01/27/2021   ALKPHOS 102 01/27/2021   HCVAB REACTIVE (A) 01/12/2014   LIPASE 34 10/31/2010    Electrolytes Lab Results  Component Value Date   NA 141 01/27/2021   K 4.4 01/27/2021   CL 102 01/27/2021   CALCIUM 10.1 01/27/2021   MG 2.2 08/29/2017    Bone Lab Results  Component Value Date   25OHVITD1 21 (L) 08/29/2017   25OHVITD2 <1.0 08/29/2017   25OHVITD3 21 08/29/2017    Inflammation (CRP: Acute Phase) (ESR: Chronic Phase) Lab Results  Component Value Date   CRP 12 (H) 08/29/2017   ESRSEDRATE 46 (H) 08/29/2017         Note: {Blank single:19197::"[name] indicates labs done and monitored by primary care practitioner using a non-[facility] EMR system","No results found under the [employer] electronic medical record","Results made available to patient.","Lab results reviewed and made available to patient.","Lab results reviewed and explained to patient in Layman's terms.","Above Lab results reviewed."}  Recent Imaging Review  DG PAIN CLINIC C-ARM 1-60 MIN NO REPORT Fluoro was used, but no Radiologist interpretation will be provided.  Please refer to "NOTES" tab for provider progress note. Note: {Blank single:19197::"No new results found.","No results found under the [employer] medical record.","Imaging results reviewed and explained to patient in Layman's terms.","Results of ordered imaging test(s) reviewed and explained to patient in Layman's terms.","Imaging results reviewed.","Reviewed"} {Blank single:19197::"Results visible under [facility].","Results visible under [facility].","Results visible under [facility].","Results visible under [facility].","Results visible under "Care Everywhere".","Results made available to patient.","Copy of results provided to patient.","     "}  Physical Exam  General appearance: {general exam:210120802::"Well  nourished, well developed, and well hydrated. In no apparent acute distress"} Mental status: {Blank single:19197::"Alert and oriented x 3. Exaggerated physical and/or psychosocial pain behavior perceived.","Alert, oriented x 3 (person, place, & time)"} {Blank single:19197::"[name]'s speech pattern and demeanor seems to suggest oversedation","     "} Respiratory: {Blank single:19197::"Oxygen-dependent COPD","No evidence of  acute respiratory distress"} Eyes: {Blank single:19197::"Miotic (pupilary constriction) due to opiate use","Midriatic","Anisocoric","Evidence of ptosis","Pin-point pupils","PERLA"} Vitals: There were no vitals taken for this visit. BMI: Estimated body mass index is 31.12 kg/m as calculated from the following:   Height as of 11/02/21: _0  (1.651 m).   Weight as of 11/02/21: 187 lb (84.8 kg). Ideal: Patient weight not recorded  Assessment   Diagnosis Status  No diagnosis found. {Blank single:19197::"Deteriorating","Having a Flare-up","Improved","Improving","Not improving","Not responding","Persistent","Recurring","Reoccurring","Resolved","Responding","Stable","Unimproved","Worsened","Worsening","Controlled"} {Blank single:19197::"Deteriorating","Having a Flare-up","Improved","Improving","Not improving","Not responding","Persistent","Recurring","Reoccurring","Resolved","Responding","Stable","Unimproved","Worsened","Worsening","Controlled"} {Blank single:19197::"Deteriorating","Having a Flare-up","Improved","Improving","Not improving","Not responding","Persistent","Recurring","Reoccurring","Resolved","Responding","Stable","Unimproved","Worsened","Worsening","Controlled"}   Updated Problems: No problems updated.  Plan of Care  Problem-specific:  No problem-specific Assessment & Plan notes found for this encounter.  Ms. MICHAELIA BEILFUSS has a current medication list which includes the following long-term medication(s): albuterol, amlodipine, atenolol,  fluticasone-salmeterol, gabapentin, ipratropium-albuterol, lisinopril, loratadine, lubiprostone, magnesium oxide -mg supplement, meloxicam, montelukast, omeprazole, paliperidone, prazosin, sertraline, tramadol, trazodone, and benefiber.  Pharmacotherapy (Medications Ordered): No orders of the defined types were placed in this encounter.  Orders:  No orders of the defined types were placed in this encounter.  Follow-up plan:   No follow-ups on file.     Interventional Therapies  Risk  Complexity Considerations:   Estimated body mass index is 31.77 kg/m as calculated from the following:   Height as of this encounter: 5' 4.5" (1.638 m).   Weight as of this encounter: 188 lb (85.3 kg). NOTE: LATEX ALLERGY & Hx Hepatitis C (w/ cirrhosis)   Planned  Pending:      Under consideration:   Diagnostic/therapeutic left cervical ESI #2  Diagnostic/therapeutic left shoulder injection #1  Therapeutic left caudal ESI #2  Therapeutic right cervical ESI #3  Therapeutic right Racz procedure #1  Diagnostic right suprascapular NB #1  Therapeutic bilateral lumbar facet RFA #1 (starting on left side)  Therapeutic bilateral cervical facet RFA #1 (starting on left side)    Completed:   Therapeutic right glenohumeral/AC  inj. x3 (09/23/2020) (100/100/80/80)  Therapeutic bilateral lumbar facet MBB x3 (06/03/2019) (100/100/90/90)  Diagnostic bilateral cervical facet MBB x1 (11/06/2017) (100/100/90)  Diagnostic left caudal ESI x1 (08/14/2019) (100/100/100 x8 days/90-100)  Therapeutic right cervical ESI x3 (04/12/2021) (100/100/80-95/80-95)  Therapeutic left cervical ESI x1 (07/19/2021) (100/100/100/100) (helped neck, shoulders, headache, and arms)    Therapeutic  Palliative (PRN) options:   Therapeutic/palliative glenohumeral/AC  inj.   Therapeutic/palliative lumbar facet MBB   Therapeutic/palliative cervical facet MBB   Therapeutic/palliative caudal ESI   Therapeutic/palliative cervical ESI       Recent Visits No visits were found meeting these conditions. Showing recent visits within past 90 days and meeting all other requirements Future Appointments Date Type Provider Dept  11/30/21 Appointment Milinda Pointer, [facility]  01/18/22 Appointment Milinda Pointer, MD [facility]  Showing future appointments within next 90 days and meeting all other requirements  I discussed the assessment and treatment plan with the patient. The patient was provided an opportunity to ask questions and all were answered. The patient agreed with the plan and demonstrated an understanding of the instructions.  Patient advised to call back or seek an in-person evaluation if the symptoms or condition worsens.  Duration of encounter: *** minutes.  Total time on encounter, as per AMA guidelines included both the face-to-face and non-face-to-face time personally spent by the physician and/or other qualified health care professional(s) on the day of the encounter (includes time in activities that require the physician or other qualified health care professional and does not include time in activities normally performed by  clinical staff). Physician's time may include the following activities when performed: preparing to see the patient (eg, review of tests, pre-charting review of records) obtaining and/or reviewing separately obtained history performing a medically appropriate examination and/or evaluation counseling and educating the patient/family/caregiver ordering medications, tests, or procedures referring and communicating with other health care professionals (when not separately reported) documenting clinical information in the electronic or other health record independently interpreting results (not separately reported) and communicating results to the patient/ family/caregiver care coordination (not separately reported)  Note by: Gaspar Cola, MD Date:  11/30/2021; Time: 12:25 PM

## 2021-11-30 ENCOUNTER — Ambulatory Visit
Admission: RE | Admit: 2021-11-30 | Discharge: 2021-11-30 | Disposition: A | Discharge status: Home / Self Care | Payer: Medicare Other | Source: Ambulatory Visit | Attending: Pain Medicine | Admitting: Pain Medicine

## 2021-11-30 ENCOUNTER — Encounter: Payer: Self-pay | Admitting: Pain Medicine

## 2021-11-30 ENCOUNTER — Ambulatory Visit (HOSPITAL_BASED_OUTPATIENT_CLINIC_OR_DEPARTMENT_OTHER): Payer: Medicare Other | Admitting: Pain Medicine

## 2021-11-30 VITALS — BP 146/80 | HR 90 | Temp 97.2°F | Ht 65.0 in | Wt 187.0 lb

## 2021-11-30 DIAGNOSIS — G8929 Other chronic pain: Secondary | ICD-10-CM

## 2021-11-30 DIAGNOSIS — C8330 Diffuse large B-cell lymphoma, unspecified site: Secondary | ICD-10-CM | POA: Insufficient documentation

## 2021-11-30 DIAGNOSIS — W19XXXA Unspecified fall, initial encounter: Secondary | ICD-10-CM | POA: Insufficient documentation

## 2021-11-30 DIAGNOSIS — M1991 Primary osteoarthritis, unspecified site: Secondary | ICD-10-CM

## 2021-11-30 DIAGNOSIS — M25511 Pain in right shoulder: Secondary | ICD-10-CM | POA: Insufficient documentation

## 2021-11-30 DIAGNOSIS — M778 Other enthesopathies, not elsewhere classified: Secondary | ICD-10-CM | POA: Insufficient documentation

## 2021-11-30 DIAGNOSIS — Z79899 Other long term (current) drug therapy: Secondary | ICD-10-CM | POA: Insufficient documentation

## 2021-11-30 DIAGNOSIS — M7591 Shoulder lesion, unspecified, right shoulder: Secondary | ICD-10-CM | POA: Insufficient documentation

## 2021-11-30 DIAGNOSIS — M7581 Other shoulder lesions, right shoulder: Secondary | ICD-10-CM | POA: Insufficient documentation

## 2021-11-30 DIAGNOSIS — R32 Unspecified urinary incontinence: Secondary | ICD-10-CM | POA: Insufficient documentation

## 2021-11-30 DIAGNOSIS — M65331 Trigger finger, right middle finger: Secondary | ICD-10-CM | POA: Insufficient documentation

## 2021-11-30 DIAGNOSIS — G5603 Carpal tunnel syndrome, bilateral upper limbs: Secondary | ICD-10-CM | POA: Insufficient documentation

## 2021-11-30 DIAGNOSIS — R3915 Urgency of urination: Secondary | ICD-10-CM | POA: Insufficient documentation

## 2021-11-30 DIAGNOSIS — Z87891 Personal history of nicotine dependence: Secondary | ICD-10-CM | POA: Insufficient documentation

## 2021-11-30 DIAGNOSIS — R339 Retention of urine, unspecified: Secondary | ICD-10-CM | POA: Insufficient documentation

## 2021-11-30 DIAGNOSIS — R0789 Other chest pain: Secondary | ICD-10-CM | POA: Insufficient documentation

## 2021-11-30 DIAGNOSIS — Z9221 Personal history of antineoplastic chemotherapy: Secondary | ICD-10-CM | POA: Insufficient documentation

## 2021-11-30 DIAGNOSIS — G894 Chronic pain syndrome: Secondary | ICD-10-CM | POA: Insufficient documentation

## 2021-11-30 DIAGNOSIS — M5117 Intervertebral disc disorders with radiculopathy, lumbosacral region: Secondary | ICD-10-CM | POA: Insufficient documentation

## 2021-11-30 DIAGNOSIS — M25512 Pain in left shoulder: Secondary | ICD-10-CM | POA: Insufficient documentation

## 2021-11-30 DIAGNOSIS — M4722 Other spondylosis with radiculopathy, cervical region: Secondary | ICD-10-CM | POA: Insufficient documentation

## 2021-11-30 DIAGNOSIS — M79662 Pain in left lower leg: Secondary | ICD-10-CM | POA: Insufficient documentation

## 2021-11-30 DIAGNOSIS — Z791 Long term (current) use of non-steroidal anti-inflammatories (NSAID): Secondary | ICD-10-CM | POA: Insufficient documentation

## 2021-11-30 DIAGNOSIS — R350 Frequency of micturition: Secondary | ICD-10-CM | POA: Insufficient documentation

## 2021-11-30 MED ORDER — PREDNISONE 20 MG PO TABS: ORAL_TABLET | ORAL | 0 refills | Status: AC

## 2021-11-30 NOTE — Progress Notes (Signed)
Safety precautions to be maintained throughout the outpatient stay will include: orient to surroundings, keep bed in low position, maintain call bell within reach at all times, provide assistance with transfer out of bed and ambulation.  

## 2021-11-30 NOTE — Patient Instructions (Signed)
______________________________________________________________________  Preparing for your procedure  During your procedure appointment there will be: No Prescription Refills. No disability issues to discussed. No medication changes or discussions.  Instructions: Food intake: Avoid eating anything solid for at least 8 hours prior to your procedure. Clear liquid intake: You may take clear liquids such as water up to 2 hours prior to your procedure. (No carbonated drinks. No soda.) Transportation: Unless otherwise stated by your physician, bring a driver. Morning Medicines: Except for blood thinners, take all of your other morning medications with a sip of water. Make sure to take your heart and blood pressure medicines. If your blood pressure's lower number is above 100, the case will be rescheduled. Blood thinners: If you take a blood thinner, but were not instructed to stop it, call our office [phone] and ask to talk to a nurse. Not stopping a blood thinner prior to certain procedures could lead to serious complications. Diabetics on insulin: Notify the staff so that you can be scheduled 1st case in the morning. If your diabetes requires high dose insulin, take only  of your normal insulin dose the morning of the procedure and notify the staff that you have done so. Preventing infections: Shower with an antibacterial soap the morning of your procedure.  Build-up your immune system: Take 1000 mg of Vitamin C with every meal (3 times a day) the day prior to your procedure. Antibiotics: Inform the nursing staff if you are taking any antibiotics or if you have any conditions that may require antibiotics prior to procedures. (Example: recent joint implants)   Pregnancy: If you are pregnant make sure to notify the nursing staff. Not doing so may result in injury to the fetus, including death.  Sickness: If you have a cold, fever, or any active infections, call and cancel or reschedule your  procedure. Receiving steroids while having an infection may result in complications. Arrival: You must be in the facility at least 30 minutes prior to your scheduled procedure. Tardiness: Your scheduled time is also the cutoff time. If you do not arrive at least 15 minutes prior to your procedure, you will be rescheduled.  Children: Do not bring any children with you. Make arrangements to keep them home. Dress appropriately: There is always a possibility that your clothing may get soiled. Avoid long dresses. Valuables: Do not bring any jewelry or valuables.  Reasons to call and reschedule or cancel your procedure: (Following these recommendations will minimize the risk of a serious complication.) Surgeries: Avoid having procedures within 2 weeks of any surgery. (Avoid for 2 weeks before or after any surgery). Flu Shots: Avoid having procedures within 2 weeks of a flu shots or . (Avoid for 2 weeks before or after immunizations). Barium: Avoid having a procedure within 7-10 days after having had a radiological study involving the use of radiological contrast. (Myelograms, Barium swallow or enema study). Heart attacks: Avoid any elective procedures or surgeries for the initial 6 months after a "Myocardial Infarction" (Heart Attack). Blood thinners: It is imperative that you stop these medications before procedures. Let us know if you if you take any blood thinner.  Infection: Avoid procedures during or within two weeks of an infection (including chest colds or gastrointestinal problems). Symptoms associated with infections include: Localized redness, fever, chills, night sweats or profuse sweating, burning sensation when voiding, cough, congestion, stuffiness, runny nose, sore throat, diarrhea, nausea, vomiting, cold or Flu symptoms, recent or current infections. It is specially important if the infection is   over the area that we intend to treat. Heart and lung problems: Symptoms that may suggest an  active cardiopulmonary problem include: cough, chest pain, breathing difficulties or shortness of breath, dizziness, ankle swelling, uncontrolled high or unusually low blood pressure, and/or palpitations. If you are experiencing any of these symptoms, cancel your procedure and contact your primary care physician for an evaluation.  Remember:  Regular Business hours are:  Monday to Thursday 8:00 AM to 4:00 PM  Provider's Schedule: Jaevian Shean, MD:  Procedure days: Tuesday and Thursday 7:30 AM to 4:00 PM  Bilal Lateef, MD:  Procedure days: Monday and Wednesday 7:30 AM to 4:00 PM  ______________________________________________________________________    ____________________________________________________________________________________________  General Risks and Possible Complications  Patient Responsibilities: It is important that you read this as it is part of your informed consent. It is our duty to inform you of the risks and possible complications associated with treatments offered to you. It is your responsibility as a patient to read this and to ask questions about anything that is not clear or that you believe was not covered in this document.  Patient's Rights: You have the right to refuse treatment. You also have the right to change your mind, even after initially having agreed to have the treatment done. However, under this last option, if you wait until the last second to change your mind, you may be charged for the materials used up to that point.  Introduction: Medicine is not an exact science. Everything in Medicine, including the lack of treatment(s), carries the potential for danger, harm, or loss (which is by definition: Risk). In Medicine, a complication is a secondary problem, condition, or disease that can aggravate an already existing one. All treatments carry the risk of possible complications. The fact that a side effects or complications occurs, does not imply  that the treatment was conducted incorrectly. It must be clearly understood that these can happen even when everything is done following the highest safety standards.  No treatment: You can choose not to proceed with the proposed treatment alternative. The "PRO(s)" would include: avoiding the risk of complications associated with the therapy. The "CON(s)" would include: not getting any of the treatment benefits. These benefits fall under one of three categories: diagnostic; therapeutic; and/or palliative. Diagnostic benefits include: getting information which can ultimately lead to improvement of the disease or symptom(s). Therapeutic benefits are those associated with the successful treatment of the disease. Finally, palliative benefits are those related to the decrease of the primary symptoms, without necessarily curing the condition (example: decreasing the pain from a flare-up of a chronic condition, such as incurable terminal cancer).  General Risks and Complications: These are associated to most interventional treatments. They can occur alone, or in combination. They fall under one of the following six (6) categories: no benefit or worsening of symptoms; bleeding; infection; nerve damage; allergic reactions; and/or death. No benefits or worsening of symptoms: In Medicine there are no guarantees, only probabilities. No healthcare provider can ever guarantee that a medical treatment will work, they can only state the probability that it may. Furthermore, there is always the possibility that the condition may worsen, either directly, or indirectly, as a consequence of the treatment. Bleeding: This is more common if the patient is taking a blood thinner, either prescription or over the counter (example: Goody Powders, Fish oil, Aspirin, Garlic, etc.), or if suffering a condition associated with impaired coagulation (example: Hemophilia, cirrhosis of the liver, low platelet counts, etc.). However, even if   you  do not have one on these, it can still happen. If you have any of these conditions, or take one of these drugs, make sure to notify your treating physician. Infection: This is more common in patients with a compromised immune system, either due to disease (example: diabetes, cancer, human immunodeficiency virus [HIV], etc.), or due to medications or treatments (example: therapies used to treat cancer and rheumatological diseases). However, even if you do not have one on these, it can still happen. If you have any of these conditions, or take one of these drugs, make sure to notify your treating physician. Nerve Damage: This is more common when the treatment is an invasive one, but it can also happen with the use of medications, such as those used in the treatment of cancer. The damage can occur to small secondary nerves, or to large primary ones, such as those in the spinal cord and brain. This damage may be temporary or permanent and it may lead to impairments that can range from temporary numbness to permanent paralysis and/or brain death. Allergic Reactions: Any time a substance or material comes in contact with our body, there is the possibility of an allergic reaction. These can range from a mild skin rash (contact dermatitis) to a severe systemic reaction (anaphylactic reaction), which can result in death. Death: In general, any medical intervention can result in death, most of the time due to an unforeseen complication. ____________________________________________________________________________________________    

## 2021-12-03 NOTE — Progress Notes (Signed)
PROVIDER NOTE: Interpretation of information contained herein should be left to medically-trained personnel. Specific patient instructions are provided elsewhere under "Patient Instructions" section of medical record. This document was created in part using STT-dictation technology, any transcriptional errors that may result from this process are unintentional.  Patient: Rachael West Type: Established DOB: [birth year] MRN: [id number] PCP: Lorrene Reid, PA-C  Service: Procedure DOS: 12/08/2021 Setting: Ambulatory Location: Ambulatory outpatient facility Delivery: Face-to-face Provider: Gaspar Cola, MD Specialty: Interventional Pain Management Specialty designation: 09 Location: Outpatient facility Ref. Prov.: Lorrene Reid, PA-C    Interventional Therapy    Procedure: Glenohumeral Joint (shoulder) Injection #4  Laterality: Right (-RT)  Level: Shoulder   Imaging: Fluoroscopy-guided         Anesthesia: Local anesthesia (1-2% Lidocaine) Anxiolysis: IV Versed 1.5 mg Sedation: No Sedation                       DOS: 12/08/2021  Performed by: Gaspar Cola, MD  Purpose: Diagnostic/Therapeutic Indications: Shoulder pain severe enough to impact quality of life or function. Rationale (medical necessity): procedure needed and proper for the diagnosis and/or treatment of Rachael West's medical symptoms and needs. 1. Chronic shoulder pain (Right)   2. Osteoarthritis of glenohumeral joint (Right)   3. Primary osteoarthritis of shoulder (Right)   4. Rotator cuff arthropathy of shoulder (Right)   5. Tendinopathy of rotator cuff (Right)   6. Tendinitis of rotator cuff (Right)   7. Shoulder enthesopathy (Right)   8. Latex precautions, history of latex allergy    NAS-11 Pain score:   Pre-procedure: 7 /10   Post-procedure: 3 /10   Note: Results of recent right shoulder diagnostic x-rays (11/30/2021) shared with the patient today and explained to her in layman's  terms.  Paper copy of the x-ray results given to the patient today.   Target: Glenohumeral Joint (shoulder) Location: Intra-articular  Region: Entire Shoulder Area Approach: Anterior approach. Type of procedure: Percutaneous joint injection   Position  Prep  Materials:  Position: Supine Prep solution: DuraPrep (Iodine Povacrylex [0.7% available iodine] and Isopropyl Alcohol, 74% w/w) Prep Area: Entire shoulder Area Materials:  Tray: Block Needle(s):  Type: Spinal  Gauge (G): 22  Length: 3.5-in  Qty: 1  Pre-op H&P Assessment:  Rachael West is a 66 y.o. (year old), female patient, seen today for interventional treatment. She  has a past surgical history that includes Appendectomy; Trigger finger release; Esophagogastroduodenoscopy (01/17/2011); Colonoscopy (01/17/2011); Abdominal hysterectomy; and Breast biopsy (Left, 2015). Rachael West has a current medication list which includes the following prescription(s): albuterol, amlodipine, atenolol, b complex vitamins, buspirone, covid-19 mrna vaccine 2023-2024, dimenhydrinate, fluticasone-salmeterol, hydroxyzine, ipratropium-albuterol, culturelle, latanoprost, lisinopril, loratadine, magnesium oxide -mg supplement, meloxicam, montelukast, omeprazole, ondansetron, paliperidone, polyethylene glycol, prazosin, prednisone, sertraline, tramadol, trazodone, gabapentin, fluad quadrivalent, lubiprostone, and benefiber. Her primarily concern today is the Shoulder Pain (right)  Initial Vital Signs:  Pulse/HCG Rate: 96  Temp: (!) 97.2 F (36.2 C) Resp: 18 BP: (!) 152/79 SpO2: 99 %  BMI: Estimated body mass index is 31.45 kg/m as calculated from the following:   Height as of this encounter: '5\' 5"'$  (1.651 m).   Weight as of this encounter: 189 lb (85.7 kg).  Risk Assessment: Allergies: Reviewed. She is allergic to latex, penicillins, codeine, cortisone, and sulfa antibiotics.  Allergy Precautions: None required Coagulopathies: Reviewed.  None identified.  Blood-thinner therapy: None at this time Active Infection(s): Reviewed. None identified. Rachael West is afebrile  Site Confirmation: Rachael West was asked  to confirm the procedure and laterality before marking the site Procedure checklist: Completed Consent: Before the procedure and under the influence of no sedative(s), amnesic(s), or anxiolytics, the patient was informed of the treatment options, risks and possible complications. To fulfill our ethical and legal obligations, as recommended by the American Medical Association's Code of Ethics, I have informed the patient of my clinical impression; the nature and purpose of the treatment or procedure; the risks, benefits, and possible complications of the intervention; the alternatives, including doing nothing; the risk(s) and benefit(s) of the alternative treatment(s) or procedure(s); and the risk(s) and benefit(s) of doing nothing. The patient was provided information about the general risks and possible complications associated with the procedure. These may include, but are not limited to: failure to achieve desired goals, infection, bleeding, organ or nerve damage, allergic reactions, paralysis, and death. In addition, the patient was informed of those risks and complications associated to the procedure, such as failure to decrease pain; infection; bleeding; organ or nerve damage with subsequent damage to sensory, motor, and/or autonomic systems, resulting in permanent pain, numbness, and/or weakness of one or several areas of the body; allergic reactions; (i.e.: anaphylactic reaction); and/or death. Furthermore, the patient was informed of those risks and complications associated with the medications. These include, but are not limited to: allergic reactions (i.e.: anaphylactic or anaphylactoid reaction(s)); adrenal axis suppression; blood sugar elevation that in diabetics may result in ketoacidosis or comma; water  retention that in patients with history of congestive heart failure may result in shortness of breath, pulmonary edema, and decompensation with resultant heart failure; weight gain; swelling or edema; medication-induced neural toxicity; particulate matter embolism and blood vessel occlusion with resultant organ, and/or nervous system infarction; and/or aseptic necrosis of one or more joints. Finally, the patient was informed that Medicine is not an exact science; therefore, there is also the possibility of unforeseen or unpredictable risks and/or possible complications that may result in a catastrophic outcome. The patient indicated having understood very clearly. We have given the patient no guarantees and we have made no promises. Enough time was given to the patient to ask questions, all of which were answered to the patient's satisfaction. Ms. Friend has indicated that she wanted to continue with the procedure. Attestation: I, the ordering provider, attest that I have discussed with the patient the benefits, risks, side-effects, alternatives, likelihood of achieving goals, and potential problems during recovery for the procedure that I have provided informed consent. Date  Time: 12/08/2021 10:54 AM   Imaging Guidance (Non-Spinal):          Type of Imaging Technique: Fluoroscopy Guidance (Non-Spinal) Indication(s): Assistance in needle guidance and placement for procedures requiring needle placement in or near specific anatomical locations not easily accessible without such assistance. Exposure Time: Please see nurses notes. Contrast: None used. Fluoroscopic Guidance: I was personally present during the use of fluoroscopy. "Tunnel Vision Technique" used to obtain the best possible view of the target area. Parallax error corrected before commencing the procedure. "Direction-depth-direction" technique used to introduce the needle under continuous pulsed fluoroscopy. Once target was reached,  antero-posterior, oblique, and lateral fluoroscopic projection used confirm needle placement in all planes. Images permanently stored in EMR. Interpretation: No contrast injected. I personally interpreted the imaging intraoperatively. Adequate needle placement confirmed in multiple planes. Permanent images saved into the patient's record.  Pre-Procedure Preparation:  Monitoring: As per clinic protocol. Respiration, ETCO2, SpO2, BP, heart rate and rhythm monitor placed and checked for adequate function Safety Precautions: Patient  was assessed for positional comfort and pressure points before starting the procedure. Time-out: I initiated and conducted the "Time-out" before starting the procedure, as per protocol. The patient was asked to participate by confirming the accuracy of the "Time Out" information. Verification of the correct person, site, and procedure were performed and confirmed by me, the nursing staff, and the patient. "Time-out" conducted as per Joint Commission's Universal Protocol (UP.01.01.01). Time: 1119  Description  Narrative of Procedure:          Rationale (medical necessity): procedure needed and proper for the diagnosis and/or treatment of the patient's medical symptoms and needs. Procedural Technique Safety Precautions: Aspiration looking for blood return was conducted prior to all injections. At no point did we inject any substances, as a needle was being advanced. No attempts were made at seeking any paresthesias. Safe injection practices and needle disposal techniques used. Medications properly checked for expiration dates. SDV (single dose vial) medications used. Description of the Procedure: Protocol guidelines were followed. The patient was placed in position over the procedure table. The target area was identified and the area prepped in the usual manner. Skin & deeper tissues infiltrated with local anesthetic. Appropriate amount of time allowed to pass for local  anesthetics to take effect. The procedure needles were then advanced to the target area. Proper needle placement secured. Negative aspiration confirmed. Solution injected in intermittent fashion, asking for systemic symptoms every 0.5cc of injectate. The needles were then removed and the area cleansed, making sure to leave some of the prepping solution back to take advantage of its long term bactericidal properties.             Vitals:   12/08/21 1116 12/08/21 1121 12/08/21 1124 12/08/21 1128  BP: 123/89 125/88 138/74 124/73  Pulse: 92 86 84 88  Resp: '16 15 16   '$ Temp:    (!) 97.2 F (36.2 C)  TempSrc:    Temporal  SpO2: 100% 100% 100% 100%  Weight:      Height:         Start Time: 1119 hrs. End Time: 1122 hrs.  Antibiotic Prophylaxis:   Anti-infectives (From admission, onward)    None      Indication(s): None identified  Post-operative Assessment:  Post-procedure Vital Signs:  Pulse/HCG Rate: 88  Temp: (!) 97.2 F (36.2 C) Resp: 16 BP: 124/73 SpO2: 100 %  EBL: None  Complications: No immediate post-treatment complications observed by team, or reported by patient.  Note: The patient tolerated the entire procedure well. A repeat set of vitals were taken after the procedure and the patient was kept under observation following institutional policy, for this type of procedure. Post-procedural neurological assessment was performed, showing return to baseline, prior to discharge. The patient was provided with post-procedure discharge instructions, including a section on how to identify potential problems. Should any problems arise concerning this procedure, the patient was given instructions to immediately contact us, at any time, without hesitation. In any case, we plan to contact the patient by telephone for a follow-up status report regarding this interventional procedure.  Comments:  No additional relevant information.  Plan of Care  Orders:  Orders Placed This  Encounter  Procedures   SHOULDER INJECTION    Scheduling Instructions:     Procedure: Intra-articular shoulder (Glenohumeral) joint injection     Side: Right-sided     Level: Glenohumeral joint               Sedation: Patient's choice.  Timeframe: Today    Order Specific Question:   Where will this procedure be performed?    Answer:   [facility]   [facility] C-ARM 1-60 MIN NO REPORT    Intraoperative interpretation by procedural physician at [facility].    Standing Status:   Standing    Number of Occurrences:   1    Order Specific Question:   Reason for exam:    Answer:   Assistance in needle guidance and placement for procedures requiring needle placement in or near specific anatomical locations not easily accessible without such assistance.   Informed Consent Details: Physician/Practitioner Attestation; Transcribe to consent form and obtain patient signature    Note: Always confirm laterality of pain with Ms. Magri, before procedure.    Order Specific Question:   Physician/Practitioner attestation of informed consent for procedure/surgical case    Answer:   I, the physician/practitioner, attest that I have discussed with the patient the benefits, risks, side effects, alternatives, likelihood of achieving goals and potential problems during recovery for the procedure that I have provided informed consent.    Order Specific Question:   Procedure    Answer:   Intra-articular shoulder joint injection under fluoroscopic guidance    Order Specific Question:   Physician/Practitioner performing the procedure    Answer:   Ardean Simonich A. Dossie Arbour, MD    Order Specific Question:   Indication/Reason    Answer:   Chronic shoulder pain secondary to shoulder arthropathy   Provide equipment / supplies at bedside    Procedure tray: "Block Tray" (Disposable  single use) Skin infiltration needle: Regular 1.5-in, 25-G, (x1) Block Needle type: Spinal Amount/quantity:  1 Size: Regular (3.5-inch) Gauge: 22G    Standing Status:   Standing    Number of Occurrences:   1    Order Specific Question:   Specify    Answer:   Block Tray   Latex precautions    Activate Latex-Free Protocol.    Standing Status:   Standing    Number of Occurrences:   1   Chronic Opioid Analgesic:  Tramadol 50 mg, 2 tabs (100 mg) PO q 6 hrs (400 mg/day of tramadol) MME/day: 40 mg/day.   Medications ordered for procedure: Meds ordered this encounter  Medications   lidocaine (XYLOCAINE) 2 % (with pres) injection 400 mg   pentafluoroprop-tetrafluoroeth (GEBAUERS) aerosol   lactated ringers infusion   midazolam (VERSED) injection 0.5-2 mg    Make sure Flumazenil is available in the pyxis when using this medication. If oversedation occurs, administer 0.2 mg IV over 15 sec. If after 45 sec no response, administer 0.2 mg again over 1 min; may repeat at 1 min intervals; not to exceed 4 doses (1 mg)   methylPREDNISolone acetate (DEPO-MEDROL) injection 80 mg   ropivacaine (PF) 2 mg/mL (0.2%) (NAROPIN) injection 9 mL   Medications administered: We administered lidocaine, pentafluoroprop-tetrafluoroeth, lactated ringers, midazolam, methylPREDNISolone acetate, and ropivacaine (PF) 2 mg/mL (0.2%).  See the medical record for exact dosing, route, and time of administration.  Follow-up plan:   Return for Proc-day (T,Th), (F2F), (PPE).        Interventional Therapies  Risk Factors  Complex Considerations:   Allergy: LATEX  Hx Hepatitis C (w/ cirrhosis)   Planned  Pending:   Diagnostic/therapeutic left shoulder inj. #1 (12/08/2021) -11/30/2021 diagnostic right shoulder x-ray impression: Sclerosis at the rotator cuff insertion site on the lateral humeral head consistent with rotator cuff pathology.  Glenohumeral degenerative changes.   Under  consideration:   Diagnostic/therapeutic left cervical ESI #2  Diagnostic/therapeutic left shoulder injection #1  Therapeutic left caudal ESI #2   Therapeutic right cervical ESI #3  Therapeutic right Racz procedure #1  Diagnostic right suprascapular NB #1  Therapeutic bilateral lumbar facet RFA #1 (starting on left side)  Therapeutic bilateral cervical facet RFA #1 (starting on left side)    Completed:   Therapeutic right glenohumeral/AC  inj. x3 (09/23/2020) (100/100/80/80)  Therapeutic bilateral lumbar facet MBB x3 (06/03/2019) (100/100/90/90)  Diagnostic bilateral cervical facet MBB x1 (11/06/2017) (100/100/90)  Diagnostic left caudal ESI x1 (08/14/2019) (100/100/100 x8 days/90-100)  Therapeutic right cervical ESI x3 (04/12/2021) (100/100/80-95/80-95)  Therapeutic left cervical ESI x1 (07/19/2021) (100/100/100/100) (helped neck, shoulders, headache, and arms)    Therapeutic  Palliative (PRN) options:   Therapeutic/palliative glenohumeral/AC  inj.   Therapeutic/palliative lumbar facet MBB   Therapeutic/palliative cervical facet MBB   Therapeutic/palliative caudal ESI   Therapeutic/palliative cervical ESI    Pharmacotherapy:  Nonopioids transfer 10/27/2019: Benefiber, Amitiza, magnesium, Mobic, and Neurontin Recommendations:   None at this time.      Recent Visits Date Type Provider Dept  11/30/21 Office Visit Milinda Pointer, MD [facility]  Showing recent visits within past 90 days and meeting all other requirements Today's Visits Date Type Provider Dept  12/08/21 Procedure visit Milinda Pointer, MD [facility]  Showing today's visits and meeting all other requirements Future Appointments Date Type Provider Dept  12/22/21 Appointment Milinda Pointer, MD [facility]  01/18/22 Appointment Milinda Pointer, MD [facility]  Showing future appointments within next 90 days and meeting all other requirements  Disposition: Discharge home  Discharge (Date  Time): 12/08/2021; 1130 hrs.   Primary Care Physician: Lorrene Reid, PA-C Location: [facility]  Facility Note by: Gaspar Cola, MD Date: 12/08/2021; Time: 12:55 PM  Disclaimer:  Medicine is not an Chief Strategy Officer. The only guarantee in medicine is that nothing is guaranteed. It is important to note that the decision to proceed with this intervention was based on the information collected from the patient. The Data and conclusions were drawn from the patient's questionnaire, the interview, and the physical examination. Because the information was provided in large part by the patient, it cannot be guaranteed that it has not been purposely or unconsciously manipulated. Every effort has been made to obtain as much relevant data as possible for this evaluation. It is important to note that the conclusions that lead to this procedure are derived in large part from the available data. Always take into account that the treatment will also be dependent on availability of resources and existing treatment guidelines, considered by other Pain Management Practitioners as being common knowledge and practice, at the time of the intervention. For Medico-Legal purposes, it is also important to point out that variation in procedural techniques and pharmacological choices are the acceptable norm. The indications, contraindications, technique, and results of the above procedure should only be interpreted and judged by a Board-Certified Interventional Pain Specialist with extensive familiarity and expertise in the same exact procedure and technique.

## 2021-12-08 ENCOUNTER — Encounter: Payer: Self-pay | Admitting: Pain Medicine

## 2021-12-08 ENCOUNTER — Ambulatory Visit
Admission: RE | Admit: 2021-12-08 | Discharge: 2021-12-08 | Disposition: A | Discharge status: Home / Self Care | Payer: Medicare Other | Source: Ambulatory Visit | Attending: Pain Medicine | Admitting: Pain Medicine

## 2021-12-08 ENCOUNTER — Ambulatory Visit: Payer: Medicare Other | Attending: Pain Medicine | Admitting: Pain Medicine

## 2021-12-08 VITALS — BP 124/73 | HR 88 | Temp 97.2°F | Resp 16 | Ht 65.0 in | Wt 189.0 lb

## 2021-12-08 DIAGNOSIS — M778 Other enthesopathies, not elsewhere classified: Secondary | ICD-10-CM | POA: Diagnosis present

## 2021-12-08 DIAGNOSIS — G8929 Other chronic pain: Secondary | ICD-10-CM | POA: Diagnosis present

## 2021-12-08 DIAGNOSIS — M67911 Unspecified disorder of synovium and tendon, right shoulder: Secondary | ICD-10-CM | POA: Diagnosis present

## 2021-12-08 DIAGNOSIS — M7581 Other shoulder lesions, right shoulder: Secondary | ICD-10-CM | POA: Diagnosis present

## 2021-12-08 DIAGNOSIS — M12811 Other specific arthropathies, not elsewhere classified, right shoulder: Secondary | ICD-10-CM

## 2021-12-08 DIAGNOSIS — M19011 Primary osteoarthritis, right shoulder: Secondary | ICD-10-CM

## 2021-12-08 DIAGNOSIS — Z91040 Latex allergy status: Secondary | ICD-10-CM | POA: Diagnosis present

## 2021-12-08 DIAGNOSIS — M25511 Pain in right shoulder: Secondary | ICD-10-CM | POA: Insufficient documentation

## 2021-12-08 DIAGNOSIS — M7591 Shoulder lesion, unspecified, right shoulder: Secondary | ICD-10-CM

## 2021-12-08 MED ORDER — ROPIVACAINE HCL 2 MG/ML IJ SOLN
9.0000 mL | Freq: Once | INTRAMUSCULAR | Status: AC
  Administered 2021-12-08: 9 mL via INTRA_ARTICULAR

## 2021-12-08 MED ORDER — LACTATED RINGERS IV SOLN: Freq: Once | INTRAVENOUS | Status: AC

## 2021-12-08 MED ORDER — MIDAZOLAM HCL 2 MG/2ML IJ SOLN
0.5000 mg | Freq: Once | INTRAMUSCULAR | Status: AC
  Administered 2021-12-08: 1.5 mg via INTRAVENOUS

## 2021-12-08 MED ORDER — PENTAFLUOROPROP-TETRAFLUOROETH EX AERO
INHALATION_SPRAY | Freq: Once | CUTANEOUS | Status: AC
  Administered 2021-12-08: 30 via TOPICAL
  Filled 2021-12-08: qty 116

## 2021-12-08 MED ORDER — METHYLPREDNISOLONE ACETATE 80 MG/ML IJ SUSP
INTRAMUSCULAR | Status: AC
  Filled 2021-12-08: qty 1

## 2021-12-08 MED ORDER — ROPIVACAINE HCL 2 MG/ML IJ SOLN
INTRAMUSCULAR | Status: AC
  Filled 2021-12-08: qty 20

## 2021-12-08 MED ORDER — LIDOCAINE HCL 2 % IJ SOLN
20.0000 mL | Freq: Once | INTRAMUSCULAR | Status: AC
  Administered 2021-12-08: 400 mg

## 2021-12-08 MED ORDER — METHYLPREDNISOLONE ACETATE 80 MG/ML IJ SUSP
80.0000 mg | Freq: Once | INTRAMUSCULAR | Status: AC
  Administered 2021-12-08: 80 mg via INTRA_ARTICULAR

## 2021-12-08 MED ORDER — LIDOCAINE HCL 2 % IJ SOLN
INTRAMUSCULAR | Status: AC
  Filled 2021-12-08: qty 20

## 2021-12-08 MED ORDER — MIDAZOLAM HCL 2 MG/2ML IJ SOLN
INTRAMUSCULAR | Status: AC
  Filled 2021-12-08: qty 2

## 2021-12-08 NOTE — Patient Instructions (Signed)

## 2021-12-08 NOTE — Progress Notes (Signed)
Safety precautions to be maintained throughout the outpatient stay will include: orient to surroundings, keep bed in low position, maintain call bell within reach at all times, provide assistance with transfer out of bed and ambulation.  

## 2021-12-09 ENCOUNTER — Telehealth: Payer: Self-pay

## 2021-12-09 NOTE — Telephone Encounter (Signed)
Post procedure follow up.  Message states the call can't be completed at this time.  Unable to leave a message.

## 2021-12-13 ENCOUNTER — Other Ambulatory Visit: Payer: Self-pay | Admitting: Pain Medicine

## 2021-12-13 ENCOUNTER — Other Ambulatory Visit: Payer: Medicare Other

## 2021-12-13 DIAGNOSIS — M25511 Pain in right shoulder: Secondary | ICD-10-CM

## 2021-12-13 DIAGNOSIS — M778 Other enthesopathies, not elsewhere classified: Secondary | ICD-10-CM

## 2021-12-13 DIAGNOSIS — M1991 Primary osteoarthritis, unspecified site: Secondary | ICD-10-CM

## 2021-12-13 DIAGNOSIS — G8929 Other chronic pain: Secondary | ICD-10-CM

## 2021-12-13 DIAGNOSIS — M7581 Other shoulder lesions, right shoulder: Secondary | ICD-10-CM

## 2021-12-15 ENCOUNTER — Inpatient Hospital Stay: Admission: RE | Admit: 2021-12-15 | Payer: Medicare Other | Source: Ambulatory Visit

## 2021-12-22 ENCOUNTER — Encounter: Payer: Self-pay | Admitting: Pain Medicine

## 2021-12-22 ENCOUNTER — Ambulatory Visit: Payer: Medicare Other | Attending: Pain Medicine | Admitting: Pain Medicine

## 2021-12-22 DIAGNOSIS — G8929 Other chronic pain: Secondary | ICD-10-CM | POA: Diagnosis not present

## 2021-12-22 DIAGNOSIS — G894 Chronic pain syndrome: Secondary | ICD-10-CM | POA: Diagnosis not present

## 2021-12-22 DIAGNOSIS — M25511 Pain in right shoulder: Secondary | ICD-10-CM

## 2021-12-22 NOTE — Progress Notes (Signed)
Patient: Rachael West  Service Category: E/M  Provider: Gaspar Cola, MD  DOB: 07-27-1955  DOS: 12/22/2021  Location: Office  MRN: [id number]  Setting: Ambulatory outpatient  Referring Provider: Lorrene Reid, PA-C  Type: Established Patient  Specialty: Interventional Pain Management  PCP: Lorrene Reid, PA-C  Location: Remote location  Delivery: TeleHealth     Virtual Encounter - Pain Management PROVIDER NOTE: Information contained herein reflects review and annotations entered in association with encounter. Interpretation of such information and data should be left to medically-trained personnel. Information provided to patient can be located elsewhere in the medical record under "Patient Instructions". Document created using STT-dictation technology, any transcriptional errors that may result from process are unintentional.    Contact & Pharmacy Preferred: [phone] Home: [phone] (home) Mobile: [phone] (mobile) E-mail: [email or web address]  [id number] [place] [address]Phone: [phone]: [phone] [address] [place]Phone: [phone]: [phone]  Pre-screening  Rachael West offered "in-person" vs "virtual" encounter. She indicated preferring virtual for this encounter.   Reason COVID-19*  Social distancing based on CDC and AMA recommendations.   I contacted LMilton Fergusonon 12/22/2021 via telephone.      I clearly identified myself as FGaspar Cola MD. I verified that I was speaking with the correct person using two identifiers (Name: LKYLINA VULTAGGIO and date of birth: [birth year].  Consent I sought verbal advanced consent from LMilton Fergusonfor virtual visit interactions. I informed Rachael West of possible  security and privacy concerns, risks, and limitations associated with providing "not-in-person" medical evaluation and management services. I also informed Rachael West of the availability of "in-person" appointments. Finally, I informed her that there would be a charge for the virtual visit and that she could be  personally, fully or partially, financially responsible for it. Ms. MHarborexpressed understanding and agreed to proceed.   Historic Elements   Ms. LSABREEN KITCHENis a 66y.o. year old, female patient evaluated today after our last contact on 12/13/2021. [name] has a past medical history of Anemia, Anxiety, Arthritis, Asthma, Depression, GERD (gastroesophageal reflux disease), Hepatitis-C, History of chemotherapy, Hypertension, Insomnia, Lymphoma ([place] (12/2013), Personal history of non-Hodgkin lymphomas, and Vitamin D deficiency. She also  has a past surgical history that includes Appendectomy; Trigger finger release; Esophagogastroduodenoscopy (01/17/2011); Colonoscopy (01/17/2011); Abdominal hysterectomy; and Breast biopsy (Left, 2015). Ms. MRodenberghas a current medication list which includes the following prescription(s): albuterol, amlodipine, atenolol, b complex vitamins, buspirone, covid-19 mrna vaccine 2023-2024, dimenhydrinate, hydroxyzine, fluad quadrivalent, ipratropium-albuterol, culturelle, latanoprost, lisinopril, magnesium oxide -mg supplement, meloxicam, montelukast, omeprazole, ondansetron, paliperidone, pantoprazole, polyethylene glycol, prazosin, sertraline, sucralfate, tramadol, trazodone, fluticasone-salmeterol, gabapentin, loratadine, lubiprostone, and benefiber. She  reports that she quit smoking about 31 years ago. Her smoking use included cigarettes. She has a 30.00 pack-year smoking history. She has never used smokeless tobacco. She reports that she does not drink alcohol and does not use drugs. Ms. MNobelis allergic to latex,  penicillins, codeine, cortisone, and sulfa antibiotics.  Estimated body mass index is 31.45 kg/m as calculated from the following:   Height as of 12/08/21: _1  (1.651 m).   Weight as of 12/08/21: 189 lb (85.7 kg).  HPI  Today, she is being contacted for a post-procedure assessment.  The patient refers having attained an ongoing 100% relief of the  pain in the area of the right shoulder.  Post-procedure evaluation   Procedure: Glenohumeral Joint (shoulder) Injection #4  Laterality: Right (-RT)  Level: Shoulder   Imaging: Fluoroscopy-guided         Anesthesia: Local anesthesia (1-2% Lidocaine) Anxiolysis: IV Versed 1.5 mg Sedation: No Sedation                       DOS: 12/08/2021  Performed by: Gaspar Cola, MD  Purpose: Diagnostic/Therapeutic Indications: Shoulder pain severe enough to impact quality of life or function. Rationale (medical necessity): procedure needed and proper for the diagnosis and/or treatment of Rachael West's medical symptoms and needs. 1. Chronic shoulder pain (Right)   2. Osteoarthritis of glenohumeral joint (Right)   3. Primary osteoarthritis of shoulder (Right)   4. Rotator cuff arthropathy of shoulder (Right)   5. Tendinopathy of rotator cuff (Right)   6. Tendinitis of rotator cuff (Right)   7. Shoulder enthesopathy (Right)   8. Latex precautions, history of latex allergy    NAS-11 Pain score:   Pre-procedure: 7 /10   Post-procedure: 3 /10   Note: Results of recent right shoulder diagnostic x-rays (11/30/2021) shared with the patient today and explained to her in layman's terms.  Paper copy of the x-ray results given to the patient today.    Effectiveness:  Initial hour after procedure: 100 %. Subsequent 4-6 hours post-procedure: 100 %. Analgesia past initial 6 hours: 100 %. Ongoing improvement:  Analgesic: The patient indicates having attained an ongoing 100% relief of her pain.  She refers that only in certain movements she will  experience some discomfort. Function: Ms. Schoenberger reports improvement in function ROM: [name] reports improvement in ROM  Pharmacotherapy Assessment   Opioid Analgesic: Tramadol 50 mg, 2 tabs (100 mg) PO q 6 hrs (400 mg/day of tramadol) MME/day: 40 mg/day.   Monitoring: [place] PMP: PDMP reviewed during this encounter.       Pharmacotherapy: No side-effects or adverse reactions reported. Compliance: No problems identified. Effectiveness: Clinically acceptable. Plan: Refer to "POC". UDS:  Summary  Date Value Ref Range Status  06/27/2021 Note  Final    Comment:    ==================================================================== [employer] Select 13 (MW) ==================================================================== Test                             Result       Flag       Units  Drug Present and Declared for Prescription Verification   Tramadol                       >5435        EXPECTED   ng/mg creat   O-Desmethyltramadol            >5435        EXPECTED   ng/mg creat   N-Desmethyltramadol            >5435        EXPECTED   ng/mg creat    Source of tramadol is a prescription medication. O-desmethyltramadol    and N-desmethyltramadol are expected metabolites of tramadol.  ==================================================================== Test                      Result    Flag   Units      Ref Range   Creatinine  92               mg/dL      >=20 ==================================================================== Declared Medications:  The flagging and interpretation on this report are based on the  following declared medications.  Unexpected results may arise from  inaccuracies in the declared medications.   **Note: The testing scope of this panel includes these medications:   Tramadol (Ultram)   **Note: The testing scope of this panel does not include the  following reported medications:   Albuterol (Ventolin HFA)  Albuterol  (Duoneb)  Amlodipine (Norvasc)  Atenolol (Tenormin)  Buspirone (Buspar)  Dimenhydrinate  Fluticasone (Advair)  Gabapentin (Neurontin)  Hydroxyzine (Atarax)  Ipratropium (Duoneb)  Latanoprost (Xalatan)  Lisinopril (Zestril)  Loratadine (Claritin)  Lubiprostone (Amitiza)  Magnesium (Mag-Ox)  Meloxicam (Mobic)  Montelukast (Singulair)  Omeprazole (Prilosec)  Ondansetron (Zofran)  Paliperidone (Invega)  Polyethylene Glycol (MiraLAX)  Prazosin (Minipress)  Probiotic  Salmeterol (Advair)  Sertraline (Zoloft)  Supplement  Trazodone (Desyrel)  Vitamin B ==================================================================== For clinical consultation, please call [phone]. ====================================================================    No results found for: "CBDTHCR", "D8THCCBX", "D9THCCBX"   Laboratory Chemistry Profile   Renal Lab Results  Component Value Date   BUN 6 (L) 01/27/2021   CREATININE 0.71 01/27/2021   BCR 8 (L) 01/27/2021   GFRAA >60 08/05/2019   GFRNONAA >60 08/12/2020    Hepatic Lab Results  Component Value Date   AST 23 01/27/2021   ALT 14 01/27/2021   ALBUMIN 4.3 01/27/2021   ALKPHOS 102 01/27/2021   HCVAB REACTIVE (A) 01/12/2014   LIPASE 34 10/31/2010    Electrolytes Lab Results  Component Value Date   NA 141 01/27/2021   K 4.4 01/27/2021   CL 102 01/27/2021   CALCIUM 10.1 01/27/2021   MG 2.2 08/29/2017    Bone Lab Results  Component Value Date   25OHVITD1 21 (L) 08/29/2017   25OHVITD2 <1.0 08/29/2017   25OHVITD3 21 08/29/2017    Inflammation (CRP: Acute Phase) (ESR: Chronic Phase) Lab Results  Component Value Date   CRP 12 (H) 08/29/2017   ESRSEDRATE 46 (H) 08/29/2017         Note: Above Lab results reviewed.  Imaging  DG PAIN CLINIC C-ARM 1-60 MIN NO REPORT Fluoro was used, but no Radiologist interpretation will be provided.  Please refer to "NOTES" tab for provider progress note.  Assessment  The primary  encounter diagnosis was Chronic shoulder pain (Right). Diagnoses of Trigger point of shoulder region (Right) and Chronic pain syndrome were also pertinent to this visit.  Plan of Care  Problem-specific:  No problem-specific Assessment & Plan notes found for this encounter.  Ms. SEENA RITACCO has a current medication list which includes the following long-term medication(s): albuterol, amlodipine, atenolol, ipratropium-albuterol, lisinopril, magnesium oxide -mg supplement, meloxicam, montelukast, omeprazole, paliperidone, prazosin, sertraline, sucralfate, tramadol, trazodone, fluticasone-salmeterol, gabapentin, loratadine, lubiprostone, and benefiber.  Pharmacotherapy (Medications Ordered): No orders of the defined types were placed in this encounter.  Orders:  No orders of the defined types were placed in this encounter.  Follow-up plan:   Return for scheduled encounter.     Interventional Therapies  Risk Factors  Complex Considerations:   Allergy: LATEX  Hx Hepatitis C (w/ cirrhosis)   Planned  Pending:      Under consideration:   Diagnostic/therapeutic left cervical ESI #2  Therapeutic left caudal ESI #2  Therapeutic right cervical ESI #3  Therapeutic right Racz procedure #1  Diagnostic right suprascapular NB #1  Therapeutic bilateral lumbar  facet RFA #1 (starting on left side)  Therapeutic bilateral cervical facet RFA #1 (starting on left side)    Completed:   Therapeutic right glenohumeral/AC  inj. x4 (12/08/2021) (100/100/100/100)  Therapeutic bilateral lumbar facet MBB x3 (06/03/2019) (100/100/90/90)  Diagnostic bilateral cervical facet MBB x1 (11/06/2017) (100/100/90)  Diagnostic left caudal ESI x1 (08/14/2019) (100/100/100 x8 days/90-100)  Therapeutic right cervical ESI x3 (04/12/2021) (100/100/80-95/80-95)  Therapeutic left cervical ESI x1 (07/19/2021) (100/100/100/100) (helped neck, shoulders, headache, and arms)    Therapeutic  Palliative (PRN) options:    Therapeutic/palliative glenohumeral/AC  inj.   Therapeutic/palliative lumbar facet MBB   Therapeutic/palliative cervical facet MBB   Therapeutic/palliative caudal ESI   Therapeutic/palliative cervical ESI    Pharmacotherapy:  Nonopioids transfer 10/27/2019: Benefiber, Amitiza, magnesium, Mobic, and Neurontin Recommendations:   None at this time.     Recent Visits Date Type Provider Dept  12/08/21 Procedure visit Milinda Pointer, MD [facility]  11/30/21 Office Visit Milinda Pointer, MD [facility]  Showing recent visits within past 90 days and meeting all other requirements Today's Visits Date Type Provider Dept  12/22/21 Office Visit Milinda Pointer, MD [facility]  Showing today's visits and meeting all other requirements Future Appointments Date Type Provider Dept  01/18/22 Appointment Milinda Pointer, MD [facility]  Showing future appointments within next 90 days and meeting all other requirements  I discussed the assessment and treatment plan with the patient. The patient was provided an opportunity to ask questions and all were answered. The patient agreed with the plan and demonstrated an understanding of the instructions.  Patient advised to call back or seek an in-person evaluation if the symptoms or condition worsens.  Duration of encounter: 13 minutes.  Note by: Gaspar Cola, MD Date: 12/22/2021; Time: 11:41 AM

## 2021-12-22 NOTE — Patient Instructions (Signed)
____________________________________________________________________________________________  Patient Information update  To: All of our patients.  Re: Name change.  It has been made official that our current name, "[facility]"   will soon be changed to "[facility]".   The purpose of this change is to eliminate any confusion created by the concept of our practice being a "Medication Management Pain Clinic". In the past this has led to the misconception that we treat pain primarily by the use of prescription medications.  Nothing can be farther from the truth.   Understanding PAIN MANAGEMENT: To further understand what our practice does, you first have to understand that "Pain Management" is a subspecialty that requires additional training once a physician has completed their specialty training, which can be in either Anesthesia, Neurology, Psychiatry, or Physical Medicine and Rehabilitation (PMR). Each one of these contributes to the final approach taken by each physician to the management of their patient's pain. To be a "Pain Management Specialist" you must have first completed one of the specialty trainings below.  Anesthesiologists - trained in clinical pharmacology and interventional techniques such as nerve blockade and regional as well as central neuroanatomy. They are trained to block pain before, during, and after surgical interventions.  Neurologists - trained in the diagnosis and pharmacological treatment of complex neurological conditions, such as Multiple Sclerosis, Parkinson's, spinal cord injuries, and other systemic conditions that may be associated with symptoms that may include but are not limited to pain. They tend to rely primarily on the treatment of chronic pain using prescription medications.  Psychiatrist - trained in conditions affecting the psychosocial  wellbeing of patients including but not limited to depression, anxiety, schizophrenia, personality disorders, addiction, and other substance use disorders that may be associated with chronic pain. They tend to rely primarily on the treatment of chronic pain using prescription medications.   Physical Medicine and Rehabilitation (PMR) physicians, also known as physiatrists - trained to treat a wide variety of medical conditions affecting the brain, spinal cord, nerves, bones, joints, ligaments, muscles, and tendons. Their training is primarily aimed at treating patients that have suffered injuries that have caused severe physical impairment. Their training is primarily aimed at the physical therapy and rehabilitation of those patients. They may also work alongside orthopedic surgeons or neurosurgeons using their expertise in assisting surgical patients to recover after their surgeries.  INTERVENTIONAL PAIN MANAGEMENT is sub-subspecialty of Pain Management.  Our physicians are Board-certified in Anesthesia, Pain Management, and Interventional Pain Management.  This meaning that not only have they been trained and Board-certified in their specialty of Anesthesia, and subspecialty of Pain Management, but they have also received further training in the sub-subspecialty of Interventional Pain Management, in order to become Board-certified as INTERVENTIONAL PAIN MANAGEMENT SPECIALIST.    Mission: Our goal is to use our skills in  INTERVENTIONAL PAIN MANAGEMENT as alternatives to the chronic use of prescription opioid medications for the treatment of pain. To make this more clear, we have changed our name to reflect what we do and offer. We will continue to offer medication management assessment and recommendations, but we will not be taking over any patient's medication management.  ____________________________________________________________________________________________      ____________________________________________________________________________________________  National Pain Medication Shortage  The [place] is experiencing worsening drug shortages. These have had a negative widespread effect on patient care and treatment. Not expected to improve any time soon. Predicted to last past 2029.   Drug shortage list (generic   names) Oxycodone IR Oxycodone/APAP Oxymorphone IR Hydromorphone Hydrocodone/APAP Morphine  Where is the problem?  Manufacturing and supply level.  Will this shortage affect you?  Only if you take any of the above pain medications.  How? You may be unable to fill your prescription.  Your pharmacist may offer a "partial fill" of your prescription. (Warning: Do not accept partial fills.) Prescriptions partially filled cannot be transferred to another pharmacy. Read our Medication Rules and Regulation. Depending on how much medicine you are dependent on, you may experience withdrawals when unable to get the medication.  Recommendations: Consider ending your dependence on opioid pain medications. Ask your pain specialist to assist you with the process. Consider switching to a medication currently not in shortage, such as Buprenorphine. Talk to your pain specialist about this option. Consider decreasing your pain medication requirements by managing tolerance thru "Drug Holidays". This may help minimize withdrawals, should you run out of medicine. Control your pain thru the use of non-pharmacological interventional therapies.   Your prescriber: Prescribers cannot be blamed for shortages. Medication manufacturing and supply issues cannot be fixed by the prescriber.   NOTE: The prescriber is not responsible for supplying the medication, or solving supply issues. Work with your pharmacist to solve it. The patient is responsible for the decision to take or continue taking the medication and for identifying and securing a legal supply source. By  law, supplying the medication is the job and responsibility of the pharmacy. The prescriber is responsible for the evaluation, monitoring, and prescribing of these medications.   Prescribers will NOT: Re-issue prescriptions that have been partially filled. Re-issue prescriptions already sent to a pharmacy.  Re-send prescriptions to a different pharmacy because yours did not have your medication. Ask pharmacist to order more medicine or transfer the prescription to another pharmacy. (Read below.)  New 2023 regulation: "September 09, 2021 Revised Regulation Allows DEA-Registered Pharmacies to Transfer Electronic Prescriptions at a Patient's Request [place] Patients now have the ability to request their electronic prescription be transferred to another pharmacy without having to go back to their practitioner to initiate the request. This revised regulation went into effect on Monday, September 05, 2021.     At a patient's request, a DEA-registered retail pharmacy can now transfer an electronic prescription for a controlled substance (schedules II-V) to another DEA-registered retail pharmacy. Prior to this change, patients would have to go through their practitioner to cancel their prescription and have it re-issued to a different pharmacy. The process was taxing and time consuming for both patients and practitioners.    The Drug Enforcement Administration [facility]) published its intent to revise the process for transferring electronic prescriptions on November 28, 2019.  The final rule was published in the federal register on August 04, 2021 and went into effect 30 days later.  Under the final rule, a prescription can only be transferred once between pharmacies, and only if allowed under existing state or other applicable law. The prescription must remain in its electronic form; may not be altered in any way; and the transfer must be communicated directly between  two licensed pharmacists. It's important to note, any authorized refills transfer with the original prescription, which means the entire prescription will be filled at the same pharmacy".  Reference: [employer] [facility] website announcement)  [email or web address].pdf ([place])   [employer] / Vol. 88, No. 143 / Thursday, August 04, 2021 / Rules and Regulations DEPARTMENT OF JUSTICE  Drug Enforcement  Administration  21 CFR Part 2904  [Docket No. [id number]]  RIN [id number]-AB64 Transfer of Electronic Prescriptions for Schedules II-V Controlled Substances Between Pharmacies for Initial Filling  ____________________________________________________________________________________________

## 2022-01-06 ENCOUNTER — Other Ambulatory Visit: Payer: Self-pay | Admitting: Pain Medicine

## 2022-01-06 DIAGNOSIS — G8929 Other chronic pain: Secondary | ICD-10-CM

## 2022-01-06 DIAGNOSIS — Z79891 Long term (current) use of opiate analgesic: Secondary | ICD-10-CM

## 2022-01-06 DIAGNOSIS — Z79899 Other long term (current) drug therapy: Secondary | ICD-10-CM

## 2022-01-06 DIAGNOSIS — G894 Chronic pain syndrome: Secondary | ICD-10-CM

## 2022-01-15 NOTE — Progress Notes (Unsigned)
PROVIDER NOTE: Information contained herein reflects review and annotations entered in association with encounter. Interpretation of such information and data should be left to medically-trained personnel. Information provided to patient can be located elsewhere in the medical record under "Patient Instructions". Document created using STT-dictation technology, any transcriptional errors that may result from process are unintentional.    Patient: Rachael West  Service Category: E/M  Provider: Gaspar Cola, MD  DOB: 02-25-1955  DOS: 01/18/2022  Referring Provider: Ellouise Newer  MRN: [id number]  Specialty: Interventional Pain Management  PCP: Lorrene Reid, PA-C  Type: Established Patient  Setting: Ambulatory outpatient    Location: Office  Delivery: Face-to-face     HPI  [name], a 67 y.o. year old female, is here today because of her No primary diagnosis found.. Ms. Bowersox primary complain today is No chief complaint on file. Last encounter: My last encounter with her was on 01/06/2022. Pertinent problems: Ms. Sandstrom has Large B-cell lymphoma ([place]); Atypical chest pain; Chronic low back pain (Primary Area of Pain) (Bilateral) w/ sciatica (Left); Chronic lower extremity pain (2ry area of Pain) (Left); Chronic upper back pain (3ry area of Pain) (Bilateral) (L>R); Chronic neck pain (4th area of Pain) (Bilateral) (L>R); Occipital headache; Chronic knee pain (Bilateral) (R>L); Chronic pain syndrome; Chronic sacroiliac joint pain; DDD (degenerative disc disease), lumbar; Lumbar facet arthropathy; Lumbar facet syndrome (Bilateral) (L>R); Spondylosis without myelopathy or radiculopathy, lumbar region; DDD (degenerative disc disease), thoracic; Thoracolumbar dextroscoliosis; Cervicogenic headache; Tendinitis of rotator cuff (Left); Tendinitis of rotator cuff (Right); Fibromyalgia; Chronic shoulder pain (Bilateral) (R>L); Chronic hip pain (Bilateral) (L>R);  Cervicalgia; Chronic idiopathic gout; DDD (degenerative disc disease), cervical; 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); Cervical facet syndrome (Bilateral) (R>L); Spondylosis without myelopathy or radiculopathy, cervical region; Chronic musculoskeletal pain; Chronic low back pain (1ry area of Pain) (Bilateral) (L>R) w/o sciatica; Cervical disc disorder at C5-C6 level with radiculopathy; Cervical radiculopathy at C6; Osteoarthrosis of multiple sites; Radicular pain of upper extremity (Right); Chronic upper extremity pain (Right); Chronic shoulder pain (Right); Arthralgia of shoulder region (Right); Shoulder enthesopathy (Right); Musculoskeletal disorder involving upper trapezius muscle (Right); Abnormal MRI, cervical spine (11/12/2019); Chronic neuropathic pain; Disturbance of skin sensation; Hyperalgesia; Trigger finger, middle finger (Right); Trigger point of shoulder region (Right); Carpal tunnel syndrome (Bilateral); Chronic shoulder pain (Left); Osteoarthritis of glenohumeral joint (Right); Primary osteoarthritis of shoulder (Right); Rotator cuff arthropathy of shoulder (Right); and Tendinopathy of rotator cuff (Right) on their pertinent problem list. Pain Assessment: Severity of   is reported as a  /10. Location:    / . Onset:  . Quality:  . Timing:  . Modifying factor(s):  Marland Kitchen Vitals:  vitals were not taken for this visit.  BMI: Estimated body mass index is 31.45 kg/m as calculated from the following:   Height as of 12/08/21: '5\' 5"'$  (1.651 m).   Weight as of 12/08/21: 189 lb (85.7 kg).  Reason for encounter: medication management. ***    Pharmacotherapy Assessment  Analgesic: Tramadol 50 mg, 2 tabs (100 mg) PO q 6 hrs (400 mg/day of tramadol) MME/day: 40 mg/day.   Monitoring: [place] PMP: PDMP reviewed during this encounter.       Pharmacotherapy: No side-effects or adverse reactions reported. Compliance: No problems identified. Effectiveness: Clinically acceptable.  No notes on  file  No results found for: "CBDTHCR" No results found for: "D8THCCBX" No results found for: "D9THCCBX"  UDS:  Summary  Date Value Ref Range Status  06/27/2021 Note  Final  Comment:    ==================================================================== [employer] Select 13 (MW) ==================================================================== Test                             Result       Flag       Units  Drug Present and Declared for Prescription Verification   Tramadol                       >5435        EXPECTED   ng/mg creat   O-Desmethyltramadol            >5435        EXPECTED   ng/mg creat   N-Desmethyltramadol            >5435        EXPECTED   ng/mg creat    Source of tramadol is a prescription medication. O-desmethyltramadol    and N-desmethyltramadol are expected metabolites of tramadol.  ==================================================================== Test                      Result    Flag   Units      Ref Range   Creatinine              92               mg/dL      >=20 ==================================================================== Declared Medications:  The flagging and interpretation on this report are based on the  following declared medications.  Unexpected results may arise from  inaccuracies in the declared medications.   **Note: The testing scope of this panel includes these medications:   Tramadol (Ultram)   **Note: The testing scope of this panel does not include the  following reported medications:   Albuterol (Ventolin HFA)  Albuterol (Duoneb)  Amlodipine (Norvasc)  Atenolol (Tenormin)  Buspirone (Buspar)  Dimenhydrinate  Fluticasone (Advair)  Gabapentin (Neurontin)  Hydroxyzine (Atarax)  Ipratropium (Duoneb)  Latanoprost (Xalatan)  Lisinopril (Zestril)  Loratadine (Claritin)  Lubiprostone (Amitiza)  Magnesium (Mag-Ox)  Meloxicam (Mobic)  Montelukast (Singulair)  Omeprazole (Prilosec)  Ondansetron (Zofran)  Paliperidone  (Invega)  Polyethylene Glycol (MiraLAX)  Prazosin (Minipress)  Probiotic  Salmeterol (Advair)  Sertraline (Zoloft)  Supplement  Trazodone (Desyrel)  Vitamin B ==================================================================== For clinical consultation, please call [phone]. ====================================================================       ROS  Constitutional: Denies any fever or chills Gastrointestinal: No reported hemesis, hematochezia, vomiting, or acute GI distress Musculoskeletal: Denies any acute onset joint swelling, redness, loss of ROM, or weakness Neurological: No reported episodes of acute onset apraxia, aphasia, dysarthria, agnosia, amnesia, paralysis, loss of coordination, or loss of consciousness  Medication Review  Benefiber, COVID-19 mRNA vaccine 2023-2024, Culturelle, Magnesium Oxide -Mg Supplement, albuterol, amLODipine, atenolol, b complex vitamins, busPIRone, dimenhyDRINATE, gabapentin, hydrOXYzine, influenza vaccine adjuvanted, ipratropium-albuterol, latanoprost, lisinopril, lubiprostone, meloxicam, montelukast, omeprazole, ondansetron, paliperidone, pantoprazole, polyethylene glycol, prazosin, sertraline, sucralfate, traMADol, and traZODone  History Review  Allergy: Ms. Tzeng is allergic to latex, penicillins, codeine, cortisone, and sulfa antibiotics. Drug: Ms. Noyce  reports no history of drug use. Alcohol:  reports no history of alcohol use. Tobacco:  reports that she quit smoking about 32 years ago. Her smoking use included cigarettes. She has a 30.00 pack-year smoking history. She has never used smokeless tobacco. Social: Ms. Gilder  reports that she quit smoking about 32 years ago. Her smoking use included cigarettes. She has a 30.00 pack-year smoking  history. She has never used smokeless tobacco. She reports that she does not drink alcohol and does not use drugs. Medical:  has a past medical history of Anemia, Anxiety,  Arthritis, Asthma, Depression, GERD (gastroesophageal reflux disease), Hepatitis-C, History of chemotherapy, Hypertension, Insomnia, Lymphoma ([place]) (12/2013), Personal history of non-Hodgkin lymphomas, and Vitamin D deficiency. Surgical: Ms. Friberg  has a past surgical history that includes Appendectomy; Trigger finger release; Esophagogastroduodenoscopy (01/17/2011); Colonoscopy (01/17/2011); Abdominal hysterectomy; and Breast biopsy (Left, 2015). Family: family history includes Allergies in her mother; Asthma in her mother and sister; Breast cancer in her cousin; Colon cancer in her paternal aunt; Diabetes in her maternal grandfather and maternal grandmother; Heart disease in her brother, father, paternal grandfather, and paternal grandmother; Liver cancer in her paternal uncle; Ovarian cancer in an other family member; Pancreatic cancer in an other family member.  Laboratory Chemistry Profile   Renal Lab Results  Component Value Date   BUN 6 (L) 01/27/2021   CREATININE 0.71 01/27/2021   BCR 8 (L) 01/27/2021   GFRAA >60 08/05/2019   GFRNONAA >60 08/12/2020    Hepatic Lab Results  Component Value Date   AST 23 01/27/2021   ALT 14 01/27/2021   ALBUMIN 4.3 01/27/2021   ALKPHOS 102 01/27/2021   HCVAB REACTIVE (A) 01/12/2014   LIPASE 34 10/31/2010    Electrolytes Lab Results  Component Value Date   NA 141 01/27/2021   K 4.4 01/27/2021   CL 102 01/27/2021   CALCIUM 10.1 01/27/2021   MG 2.2 08/29/2017    Bone Lab Results  Component Value Date   25OHVITD1 21 (L) 08/29/2017   25OHVITD2 <1.0 08/29/2017   25OHVITD3 21 08/29/2017    Inflammation (CRP: Acute Phase) (ESR: Chronic Phase) Lab Results  Component Value Date   CRP 12 (H) 08/29/2017   ESRSEDRATE 46 (H) 08/29/2017         Note: Above Lab results reviewed.  Recent Imaging Review  DG PAIN CLINIC C-ARM 1-60 MIN NO REPORT Fluoro was used, but no Radiologist interpretation will be provided.  Please refer to "NOTES" tab  for provider progress note. Note: Reviewed        Physical Exam  General appearance: Well nourished, well developed, and well hydrated. In no apparent acute distress Mental status: Alert, oriented x 3 (person, place, & time)       Respiratory: No evidence of acute respiratory distress Eyes: PERLA Vitals: There were no vitals taken for this visit. BMI: Estimated body mass index is 31.45 kg/m as calculated from the following:   Height as of 12/08/21: '5\' 5"'$  (1.651 m).   Weight as of 12/08/21: 189 lb (85.7 kg). Ideal: Patient weight not recorded  Assessment   Diagnosis Status  No diagnosis found. Controlled Controlled Controlled   Updated Problems: No problems updated.  Plan of Care  Problem-specific:  No problem-specific Assessment & Plan notes found for this encounter.  Ms. ALLEIGH MOLLICA has a current medication list which includes the following long-term medication(s): albuterol, amlodipine, atenolol, gabapentin, ipratropium-albuterol, lisinopril, lubiprostone, magnesium oxide -mg supplement, meloxicam, montelukast, omeprazole, paliperidone, prazosin, sertraline, sucralfate, tramadol, trazodone, and benefiber.  Pharmacotherapy (Medications Ordered): No orders of the defined types were placed in this encounter.  Orders:  No orders of the defined types were placed in this encounter.  Follow-up plan:   No follow-ups on file.     Interventional Therapies  Risk Factors  Complex Considerations:   Allergy: LATEX  Hx Hepatitis C (w/ cirrhosis)   Planned  Pending:      Under consideration:   Diagnostic/therapeutic left cervical ESI #2  Therapeutic left caudal ESI #2  Therapeutic right cervical ESI #3  Therapeutic right Racz procedure #1  Diagnostic right suprascapular NB #1  Therapeutic bilateral lumbar facet RFA #1 (starting on left side)  Therapeutic bilateral cervical facet RFA #1 (starting on left side)    Completed:   Therapeutic right glenohumeral/AC   inj. x4 (12/08/2021) (100/100/100/100)  Therapeutic bilateral lumbar facet MBB x3 (06/03/2019) (100/100/90/90)  Diagnostic bilateral cervical facet MBB x1 (11/06/2017) (100/100/90)  Diagnostic left caudal ESI x1 (08/14/2019) (100/100/100 x8 days/90-100)  Therapeutic right cervical ESI x3 (04/12/2021) (100/100/80-95/80-95)  Therapeutic left cervical ESI x1 (07/19/2021) (100/100/100/100) (helped neck, shoulders, headache, and arms)    Therapeutic  Palliative (PRN) options:   Therapeutic/palliative glenohumeral/AC  inj.   Therapeutic/palliative lumbar facet MBB   Therapeutic/palliative cervical facet MBB   Therapeutic/palliative caudal ESI   Therapeutic/palliative cervical ESI    Pharmacotherapy:  Nonopioids transfer 10/27/2019: Benefiber, Amitiza, magnesium, Mobic, and Neurontin Recommendations:   None at this time.      Recent Visits Date Type Provider Dept  12/22/21 Office Visit Milinda Pointer, MD [facility]  12/08/21 Procedure visit Milinda Pointer, MD [facility]  11/30/21 Office Visit Milinda Pointer, MD [facility]  Showing recent visits within past 90 days and meeting all other requirements Future Appointments Date Type Provider Dept  01/18/22 Appointment Milinda Pointer, MD [facility]  Showing future appointments within next 90 days and meeting all other requirements  I discussed the assessment and treatment plan with the patient. The patient was provided an opportunity to ask questions and all were answered. The patient agreed with the plan and demonstrated an understanding of the instructions.  Patient advised to call back or seek an in-person evaluation if the symptoms or condition worsens.  Duration of encounter: *** minutes.  Total time on encounter, as per AMA guidelines included both the face-to-face and non-face-to-face time personally spent by the physician and/or other qualified health care professional(s) on the  day of the encounter (includes time in activities that require the physician or other qualified health care professional and does not include time in activities normally performed by clinical staff). Physician's time may include the following activities when performed: Preparing to see the patient (e.g., pre-charting review of records, searching for previously ordered imaging, lab work, and nerve conduction tests) Review of prior analgesic pharmacotherapies. Reviewing PMP Interpreting ordered tests (e.g., lab work, imaging, nerve conduction tests) Performing post-procedure evaluations, including interpretation of diagnostic procedures Obtaining and/or reviewing separately obtained history Performing a medically appropriate examination and/or evaluation Counseling and educating the patient/family/caregiver Ordering medications, tests, or procedures Referring and communicating with other health care professionals (when not separately reported) Documenting clinical information in the electronic or other health record Independently interpreting results (not separately reported) and communicating results to the patient/ family/caregiver Care coordination (not separately reported)  Note by: Gaspar Cola, MD Date: 01/18/2022; Time: 6:27 PM

## 2022-01-18 ENCOUNTER — Encounter: Payer: Self-pay | Admitting: Pain Medicine

## 2022-01-18 ENCOUNTER — Ambulatory Visit: Payer: Medicare Other | Attending: Pain Medicine | Admitting: Pain Medicine

## 2022-01-18 VITALS — BP 145/73 | HR 89 | Temp 97.3°F | Ht 65.0 in | Wt 189.0 lb

## 2022-01-18 DIAGNOSIS — G894 Chronic pain syndrome: Secondary | ICD-10-CM | POA: Diagnosis present

## 2022-01-18 DIAGNOSIS — Z79891 Long term (current) use of opiate analgesic: Secondary | ICD-10-CM | POA: Diagnosis present

## 2022-01-18 DIAGNOSIS — M5442 Lumbago with sciatica, left side: Secondary | ICD-10-CM | POA: Diagnosis not present

## 2022-01-18 DIAGNOSIS — M79605 Pain in left leg: Secondary | ICD-10-CM | POA: Diagnosis not present

## 2022-01-18 DIAGNOSIS — G8929 Other chronic pain: Secondary | ICD-10-CM | POA: Insufficient documentation

## 2022-01-18 DIAGNOSIS — M542 Cervicalgia: Secondary | ICD-10-CM | POA: Diagnosis present

## 2022-01-18 DIAGNOSIS — M549 Dorsalgia, unspecified: Secondary | ICD-10-CM | POA: Insufficient documentation

## 2022-01-18 DIAGNOSIS — Z79899 Other long term (current) drug therapy: Secondary | ICD-10-CM | POA: Diagnosis present

## 2022-01-18 MED ORDER — NALOXONE HCL 4 MG/0.1ML NA LIQD: 1.0000 | NASAL | 0 refills | Status: DC | PRN

## 2022-01-18 MED ORDER — TRAMADOL HCL 50 MG PO TABS: 50.0000 mg | ORAL_TABLET | Freq: Four times a day (QID) | ORAL | 5 refills | Status: DC | PRN

## 2022-01-18 NOTE — Patient Instructions (Signed)
____________________________________________________________________________________________  Patient Information update  To: All of our patients.  Re: Name change.  It has been made official that our current name, "[facility]"   will soon be changed to "[facility]".   The purpose of this change is to eliminate any confusion created by the concept of our practice being a "Medication Management Pain Clinic". In the past this has led to the misconception that we treat pain primarily by the use of prescription medications.  Nothing can be farther from the truth.   Understanding PAIN MANAGEMENT: To further understand what our practice does, you first have to understand that "Pain Management" is a subspecialty that requires additional training once a physician has completed their specialty training, which can be in either Anesthesia, Neurology, Psychiatry, or Physical Medicine and Rehabilitation (PMR). Each one of these contributes to the final approach taken by each physician to the management of their patient's pain. To be a "Pain Management Specialist" you must have first completed one of the specialty trainings below.  Anesthesiologists - trained in clinical pharmacology and interventional techniques such as nerve blockade and regional as well as central neuroanatomy. They are trained to block pain before, during, and after surgical interventions.  Neurologists - trained in the diagnosis and pharmacological treatment of complex neurological conditions, such as Multiple Sclerosis, Parkinson's, spinal cord injuries, and other systemic conditions that may be associated with symptoms that may include but are not limited to pain. They tend to rely primarily on the treatment of chronic pain using prescription medications.  Psychiatrist - trained in conditions affecting the psychosocial  wellbeing of patients including but not limited to depression, anxiety, schizophrenia, personality disorders, addiction, and other substance use disorders that may be associated with chronic pain. They tend to rely primarily on the treatment of chronic pain using prescription medications.   Physical Medicine and Rehabilitation (PMR) physicians, also known as physiatrists - trained to treat a wide variety of medical conditions affecting the brain, spinal cord, nerves, bones, joints, ligaments, muscles, and tendons. Their training is primarily aimed at treating patients that have suffered injuries that have caused severe physical impairment. Their training is primarily aimed at the physical therapy and rehabilitation of those patients. They may also work alongside orthopedic surgeons or neurosurgeons using their expertise in assisting surgical patients to recover after their surgeries.  INTERVENTIONAL PAIN MANAGEMENT is sub-subspecialty of Pain Management.  Our physicians are Board-certified in Anesthesia, Pain Management, and Interventional Pain Management.  This meaning that not only have they been trained and Board-certified in their specialty of Anesthesia, and subspecialty of Pain Management, but they have also received further training in the sub-subspecialty of Interventional Pain Management, in order to become Board-certified as INTERVENTIONAL PAIN MANAGEMENT SPECIALIST.    Mission: Our goal is to use our skills in  INTERVENTIONAL PAIN MANAGEMENT as alternatives to the chronic use of prescription opioid medications for the treatment of pain. To make this more clear, we have changed our name to reflect what we do and offer. We will continue to offer medication management assessment and recommendations, but we will not be taking over any patient's medication management.  ____________________________________________________________________________________________      ____________________________________________________________________________________________  National Pain Medication Shortage  The [place] is experiencing worsening drug shortages. These have had a negative widespread effect on patient care and treatment. Not expected to improve any time soon. Predicted to last past 2029.   Drug shortage list (generic   names) Oxycodone IR Oxycodone/APAP Oxymorphone IR Hydromorphone Hydrocodone/APAP Morphine  Where is the problem?  Manufacturing and supply level.  Will this shortage affect you?  Only if you take any of the above pain medications.  How? You may be unable to fill your prescription.  Your pharmacist may offer a "partial fill" of your prescription. (Warning: Do not accept partial fills.) Prescriptions partially filled cannot be transferred to another pharmacy. Read our Medication Rules and Regulation. Depending on how much medicine you are dependent on, you may experience withdrawals when unable to get the medication.  Recommendations: Consider ending your dependence on opioid pain medications. Ask your pain specialist to assist you with the process. Consider switching to a medication currently not in shortage, such as Buprenorphine. Talk to your pain specialist about this option. Consider decreasing your pain medication requirements by managing tolerance thru "Drug Holidays". This may help minimize withdrawals, should you run out of medicine. Control your pain thru the use of non-pharmacological interventional therapies.   Your prescriber: Prescribers cannot be blamed for shortages. Medication manufacturing and supply issues cannot be fixed by the prescriber.   NOTE: The prescriber is not responsible for supplying the medication, or solving supply issues. Work with your pharmacist to solve it. The patient is responsible for the decision to take or continue taking the medication and for identifying and securing a legal supply source. By  law, supplying the medication is the job and responsibility of the pharmacy. The prescriber is responsible for the evaluation, monitoring, and prescribing of these medications.   Prescribers will NOT: Re-issue prescriptions that have been partially filled. Re-issue prescriptions already sent to a pharmacy.  Re-send prescriptions to a different pharmacy because yours did not have your medication. Ask pharmacist to order more medicine or transfer the prescription to another pharmacy. (Read below.)  New 2023 regulation: "September 09, 2021 Revised Regulation Allows DEA-Registered Pharmacies to Transfer Electronic Prescriptions at a Patient's Request [place] Patients now have the ability to request their electronic prescription be transferred to another pharmacy without having to go back to their practitioner to initiate the request. This revised regulation went into effect on Monday, September 05, 2021.     At a patient's request, a DEA-registered retail pharmacy can now transfer an electronic prescription for a controlled substance (schedules II-V) to another DEA-registered retail pharmacy. Prior to this change, patients would have to go through their practitioner to cancel their prescription and have it re-issued to a different pharmacy. The process was taxing and time consuming for both patients and practitioners.    The Drug Enforcement Administration [facility]) published its intent to revise the process for transferring electronic prescriptions on November 28, 2019.  The final rule was published in the federal register on August 04, 2021 and went into effect 30 days later.  Under the final rule, a prescription can only be transferred once between pharmacies, and only if allowed under existing state or other applicable law. The prescription must remain in its electronic form; may not be altered in any way; and the transfer must be communicated directly between  two licensed pharmacists. It's important to note, any authorized refills transfer with the original prescription, which means the entire prescription will be filled at the same pharmacy".  Reference: [employer] [facility] website announcement)  [email or web address] ([place])   [employer] / Vol. 88, No. 143 / Thursday, August 04, 2021 / Rules and Regulations DEPARTMENT OF JUSTICE  Drug Enforcement  Administration  21 CFR Part 1306  [Docket No. [id number]]  RIN 1117-AB64 Transfer of Electronic Prescriptions for Schedules II-V Controlled Substances Between Pharmacies for Initial Filling  ____________________________________________________________________________________________     _______________________________________________________________________  Medication Rules  Purpose: To inform patients, and their family members, of our medication rules and regulations.  Applies to: All patients receiving prescriptions from our practice (written or electronic).  Pharmacy of record: This is the pharmacy where your electronic prescriptions will be sent. Make sure we have the correct one.  Electronic prescriptions: In compliance with the [place] (STOP) Act of 2017 (Session Lanny Cramp [phone]), effective January 09, 2018, all controlled substances must be electronically prescribed. Written prescriptions, faxing, or calling prescriptions to a pharmacy will no longer be done.  Prescription refills: These will be provided only during in-person appointments. No medications will be renewed without a "face-to-face" evaluation with your provider. Applies to all prescriptions.  NOTE: The following applies primarily to controlled substances (Opioid* Pain Medications).   Type of encounter  (visit): For patients receiving controlled substances, face-to-face visits are required. (Not an option and not up to the patient.)  Patient's responsibilities: Pain Pills: Bring all pain pills to every appointment (except for procedure appointments). Pill Bottles: Bring pills in original pharmacy bottle. Bring bottle, even if empty. Always bring the bottle of the most recent fill.  Medication refills: You are responsible for knowing and keeping track of what medications you are taking and when is it that you will need a refill. The day before your appointment: write a list of all prescriptions that need to be refilled. The day of the appointment: give the list to the admitting nurse. Prescriptions will be written only during appointments. No prescriptions will be written on procedure days. If you forget a medication: it will not be "Called in", "Faxed", or "electronically sent". You will need to get another appointment to get these prescribed. No early refills. Do not call asking to have your prescription filled early. Partial  or short prescriptions: Occasionally your pharmacy may not have enough pills to fill your prescription.  NEVER ACCEPT a partial fill or a prescription that is short of the total amount of pills that you were prescribed.  With controlled substances the law allows 72 hours for the pharmacy to complete the prescription.  If the prescription is not completed within 72 hours, the pharmacist will require a new prescription to be written. This means that you will be short on your medicine and we WILL NOT send another prescription to complete your original prescription.  Instead, request the pharmacy to send a carrier to a nearby branch to get enough medication to provide you with your full prescription. Prescription Accuracy: You are responsible for carefully inspecting your prescriptions before leaving our office. Have the discharge nurse carefully go over each prescription with you,  before taking them home. Make sure that your name is accurately spelled, that your address is correct. Check the name and dose of your medication to make sure it is accurate. Check the number of pills, and the written instructions to make sure they are clear and accurate. Make sure that you are given enough medication to last until your next medication refill appointment. Taking Medication: Take medication as prescribed. When it comes to controlled substances, taking less pills or less frequently than prescribed is permitted and encouraged. Never take more pills than instructed. Never take the medication more frequently than prescribed.  Inform other Doctors: Always inform, all of  your healthcare providers, of all the medications you take. Pain Medication from other Providers: You are not allowed to accept any additional pain medication from any other Doctor or Healthcare provider. There are two exceptions to this rule. (see below) In the event that you require additional pain medication, you are responsible for notifying us, as stated below. Cough Medicine: Often these contain an opioid, such as codeine or hydrocodone. Never accept or take cough medicine containing these opioids if you are already taking an opioid* medication. The combination may cause respiratory failure and death. Medication Agreement: You are responsible for carefully reading and following our Medication Agreement. This must be signed before receiving any prescriptions from our practice. Safely store a copy of your signed Agreement. Violations to the Agreement will result in no further prescriptions. (Additional copies of our Medication Agreement are available upon request.) Laws, Rules, & Regulations: All patients are expected to follow all Federal and State Laws, Statutes, Rules, & Regulations. Ignorance of the Laws does not constitute a valid excuse.  Illegal drugs and Controlled Substances: The use of illegal substances (including,  but not limited to marijuana and its derivatives) and/or the illegal use of any controlled substances is strictly prohibited. Violation of this rule may result in the immediate and permanent discontinuation of any and all prescriptions being written by our practice. The use of any illegal substances is prohibited. Adopted CDC guidelines & recommendations: Target dosing levels will be at or below 60 MME/day. Use of benzodiazepines** is not recommended.  Exceptions: There are only two exceptions to the rule of not receiving pain medications from other Healthcare Providers. Exception #1 (Emergencies): In the event of an emergency (i.e.: accident requiring emergency care), you are allowed to receive additional pain medication. However, you are responsible for: As soon as you are able, call our office [phone], at any time of the day or night, and leave a message stating your name, the date and nature of the emergency, and the name and dose of the medication prescribed. In the event that your call is answered by a member of our staff, make sure to document and save the date, time, and the name of the person that took your information.  Exception #2 (Planned Surgery): In the event that you are scheduled by another doctor or dentist to have any type of surgery or procedure, you are allowed (for a period no longer than 30 days), to receive additional pain medication, for the acute post-op pain. However, in this case, you are responsible for picking up a copy of our "Post-op Pain Management for Surgeons" handout, and giving it to your surgeon or dentist. This document is available at our office, and does not require an appointment to obtain it. Simply go to our office during business hours (Monday-Thursday from 8:00 AM to 4:00 PM) (Friday 8:00 AM to 12:00 Noon) or if you have a scheduled appointment with us, prior to your surgery, and ask for it by name. In addition, you are responsible for: calling our office  [phone], at any time of the day or night, and leaving a message stating your name, name of your surgeon, type of surgery, and date of procedure or surgery. Failure to comply with your responsibilities may result in termination of therapy involving the controlled substances. Medication Agreement Violation. Following the above rules, including your responsibilities will help you in avoiding a Medication Agreement Violation ("Breaking your Pain Medication Contract").  Consequences:  Not following the above rules may result   in permanent discontinuation of medication prescription therapy.  *Opioid medications include: morphine, codeine, oxycodone, oxymorphone, hydrocodone, hydromorphone, meperidine, tramadol, tapentadol, buprenorphine, fentanyl, methadone. **Benzodiazepine medications include: diazepam (Valium), alprazolam (Xanax), clonazepam (Klonopine), lorazepam (Ativan), clorazepate (Tranxene), chlordiazepoxide (Librium), estazolam (Prosom), oxazepam (Serax), temazepam (Restoril), triazolam (Halcion) (Last updated: 11/01/2021) ______________________________________________________________________    ______________________________________________________________________  Medication Recommendations and Reminders  Applies to: All patients receiving prescriptions (written and/or electronic).  Medication Rules & Regulations: You are responsible for reading, knowing, and following our "Medication Rules" document. These exist for your safety and that of others. They are not flexible and neither are we. Dismissing or ignoring them is an act of "non-compliance" that may result in complete and irreversible termination of such medication therapy. For safety reasons, "non-compliance" will not be tolerated. As with the U[place] fundamental legal principle of "ignorance of the law is no defense", we will accept no excuses for not having read and knowing the content of documents provided to you by our  practice.  Pharmacy of record:  Definition: This is the pharmacy where your electronic prescriptions will be sent.  We do not endorse any particular pharmacy. It is up to you and your insurance to decide what pharmacy to use.  We do not restrict you in your choice of pharmacy. However, once we write for your prescriptions, we will NOT be re-sending more prescriptions to fix restricted supply problems created by your pharmacy, or your insurance.  The pharmacy listed in the electronic medical record should be the one where you want electronic prescriptions to be sent. If you choose to change pharmacy, simply notify our nursing staff. Changes will be made only during your regular appointments and not over the phone.  Recommendations: Keep all of your pain medications in a safe place, under lock and key, even if you live alone. We will NOT replace lost, stolen, or damaged medication. We do not accept "Police Reports" as proof of medications having been stolen. After you fill your prescription, take 1 week's worth of pills and put them away in a safe place. You should keep a separate, properly labeled bottle for this purpose. The remainder should be kept in the original bottle. Use this as your primary supply, until it runs out. Once it's gone, then you know that you have 1 week's worth of medicine, and it is time to come in for a prescription refill. If you do this correctly, it is unlikely that you will ever run out of medicine. To make sure that the above recommendation works, it is very important that you make sure your medication refill appointments are scheduled at least 1 week before you run out of medicine. To do this in an effective manner, make sure that you do not leave the office without scheduling your next medication management appointment. Always ask the nursing staff to show you in your prescription , when your medication will be running out. Then arrange for the receptionist to get you a  return appointment, at least 7 days before you run out of medicine. Do not wait until you have 1 or 2 pills left, to come in. This is very poor planning and does not take into consideration that we may need to cancel appointments due to bad weather, sickness, or emergencies affecting our staff. DO NOT ACCEPT A "Partial Fill": If for any reason your pharmacy does not have enough pills/tablets to completely fill or refill your prescription, do not allow for a "partial fill". The law allows the pharmacy to complete   that prescription within 72 hours, without requiring a new prescription. If they do not fill the rest of your prescription within those 72 hours, you will need a separate prescription to fill the remaining amount, which we will NOT provide. If the reason for the partial fill is your insurance, you will need to talk to the pharmacist about payment alternatives for the remaining tablets, but again, DO NOT ACCEPT A PARTIAL FILL, unless you can trust your pharmacist to obtain the remainder of the pills within 72 hours.  Prescription refills and/or changes in medication(s):  Prescription refills, and/or changes in dose or medication, will be conducted only during scheduled medication management appointments. (Applies to both, written and electronic prescriptions.) No refills on procedure days. No medication will be changed or started on procedure days. No changes, adjustments, and/or refills will be conducted on a procedure day. Doing so will interfere with the diagnostic portion of the procedure. No phone refills. No medications will be "called into the pharmacy". No Fax refills. No weekend refills. No Holliday refills. No after hours refills.  Remember:  Business hours are:  Monday to Thursday 8:00 AM to 4:00 PM Provider's Schedule: Milinda Pointer, MD - Appointments are:  Medication management: Monday and Wednesday 8:00 AM to 4:00 PM Procedure day: Tuesday and Thursday 7:30 AM to 4:00  PM Gillis Santa, MD - Appointments are:  Medication management: Tuesday and Thursday 8:00 AM to 4:00 PM Procedure day: Monday and Wednesday 7:30 AM to 4:00 PM (Last update: 11/01/2021) ______________________________________________________________________    ____________________________________________________________________________________________  Drug Holidays  What is a "Drug Holiday"? Drug Holiday: is the name given to the process of slowly tapering down and temporarily stopping the pain medication for the purpose of decreasing or eliminating tolerance to the drug.  Benefits Improved effectiveness Decreased required effective dose Improved pain control End dependence on high dose therapy Decrease cost of therapy Uncovering "opioid-induced hyperalgesia". (OIH)  What is "opioid hyperalgesia"? It is a paradoxical increase in pain caused by exposure to opioids. Stopping the opioid pain medication, contrary to the expected, it actually decreases or completely eliminates the pain. Ref.: "A comprehensive review of opioid-induced hyperalgesia". Brion Aliment, et.al. Pain Physician. 2011 Mar-Apr;14(2):145-61.  What is tolerance? Tolerance: the progressive loss of effectiveness of a pain medicine due to repetitive use. A common problem of opioid pain medications.  How long should a "Drug Holiday" last? Effectiveness depends on the patient staying off all opioid pain medicines for a minimum of 14 consecutive days. (2 weeks)  How about just taking less of the medicine? Does not work. Will not accomplish goal of eliminating the excess receptors.  How about switching to a different pain medicine? (AKA. "Opioid rotation") Does not work. Creates the illusion of effectiveness by taking advantage of inaccurate equivalent dose calculations between different opioids. -This "technique" was promoted by studies funded by [employer], such as [employer], creators of  "OxyContin".  Can I stop the medicine "cold [place]"? Depends. You should always coordinate with your Pain Specialist to make the transition as smoothly as possible. Avoid stopping the medicine abruptly without consulting. We recommend a "slow taper".  What is a slow taper? Taper: refers to the gradual decrease in dose.   How do I stop/taper the dose? Slowly. Decrease the daily amount of pills that you take by one (1) pill every seven (7) days. This is called a "slow downward taper". Example: if you normally take four (4) pills per day, drop it to three (3) pills per day  for seven (7) days, then to two (2) pills per day for seven (7) days, then to one (1) per day for seven (7) days, and then stop the medicine. The 14 day "Drug Holiday" starts on the first day without medicine.   Will I experience withdrawals? Unlikely with a slow taper.  What triggers withdrawals? Withdrawals are triggered by the sudden/abrupt stop of high dose opioids. Withdrawals can be avoided by slowly decreasing the dose over a prolonged period of time.  What are withdrawals? Symptoms associated with sudden/abrupt reduction/stopping of high-dose, long-term use of pain medication. Withdrawal are seldom seen on low dose therapy, or patients rarely taking opioid medication.  Early Withdrawal Symptoms may include: Agitation Anxiety Muscle aches Increased tearing Insomnia Runny nose Sweating Yawning  Late symptoms may include: Abdominal cramping Diarrhea Dilated pupils Goose bumps Nausea Vomiting  (Last update: 12/18/2021) ____________________________________________________________________________________________   ____________________________________________________________________________________________  Naloxone Nasal Spray  Why am I receiving this medication? [place] STOP ACT requires that all patients taking high dose opioids or at risk of opioids respiratory depression, be prescribed an  opioid reversal agent, such as Naloxone (AKA: Narcan).  What is this medication? NALOXONE (nal OX one) treats opioid overdose, which causes slow or shallow breathing, severe drowsiness, or trouble staying awake. Call emergency services after using this medication. You may need additional treatment. Naloxone works by reversing the effects of opioids. It belongs to a group of medications called opioid blockers.  COMMON BRAND NAME(S): Kloxxado, Narcan  What should I tell my care team before I take this medication? They need to know if you have any of these conditions: Heart disease Substance use disorder An unusual or allergic reaction to naloxone, other medications, foods, dyes, or preservatives Pregnant or trying to get pregnant Breast-feeding  When to use this medication? This medication is to be used for the treatment of respiratory depression (less than 8 breaths per minute) secondary to opioid overdose.   How to use this medication? This medication is for use in the nose. Lay the person on their back. Support their neck with your hand and allow the head to tilt back before giving the medication. The nasal spray should be given into 1 nostril. After giving the medication, move the person onto their side. Do not remove or test the nasal spray until ready to use. Get emergency medical help right away after giving the first dose of this medication, even if the person wakes up. You should be familiar with how to recognize the signs and symptoms of a narcotic overdose. If more doses are needed, give the additional dose in the other nostril. Talk to your care team about the use of this medication in children. While this medication may be prescribed for children as young as newborns for selected conditions, precautions do apply.  Naloxone Overdosage: If you think you have taken too much of this medicine contact a poison control center or emergency room at once.  NOTE: This medicine is only for  you. Do not share this medicine with others.  What if I miss a dose? This does not apply.  What may interact with this medication? This is only used during an emergency. No interactions are expected during emergency use. This list may not describe all possible interactions. Give your health care provider a list of all the medicines, herbs, non-prescription drugs, or dietary supplements you use. Also tell them if you smoke, drink alcohol, or use illegal drugs. Some items may interact with your medicine.  What  should I watch for while using this medication? Keep this medication ready for use in the case of an opioid overdose. Make sure that you have the phone number of your care team and local hospital ready. You may need to have additional doses of this medication. Each nasal spray contains a single dose. Some emergencies may require additional doses. After use, bring the treated person to the nearest hospital or call 911. Make sure the treating care team knows that the person has received a dose of this medication. You will receive additional instructions on what to do during and after use of this medication before an emergency occurs.  What side effects may I notice from receiving this medication? Side effects that you should report to your care team as soon as possible: Allergic reactions--skin rash, itching, hives, swelling of the face, lips, tongue, or throat Side effects that usually do not require medical attention (report these to your care team if they continue or are bothersome): Constipation Dryness or irritation inside the nose Headache Increase in blood pressure Muscle spasms Stuffy nose Toothache This list may not describe all possible side effects. Call your doctor for medical advice about side effects. You may report side effects to FDA at 1-800-FDA-1088.  Where should I keep my medication? Because this is an emergency medication, you should keep it with you at all times.   Keep out of the reach of children and pets. Store between 20 and 25 degrees C (68 and 77 degrees F). Do not freeze. Throw away any unused medication after the expiration date. Keep in original box until ready to use.  NOTE: This sheet is a summary. It may not cover all possible information. If you have questions about this medicine, talk to your doctor, pharmacist, or health care provider.   2023 Elsevier/Gold Standard (2020-09-03 00:00:00)  ____________________________________________________________________________________________

## 2022-01-18 NOTE — Progress Notes (Signed)
Nursing Pain Medication Assessment:  Safety precautions to be maintained throughout the outpatient stay will include: orient to surroundings, keep bed in low position, maintain call bell within reach at all times, provide assistance with transfer out of bed and ambulation.  Medication Inspection Compliance: Pill count conducted under aseptic conditions, in front of the patient. Neither the pills nor the bottle was removed from the patient's sight at any time. Once count was completed pills were immediately returned to the patient in their original bottle.  Medication: Tramadol (Ultram) Pill/Patch Count:  8 of 170 pills remain Pill/Patch Appearance: Markings consistent with prescribed medication Bottle Appearance: Standard pharmacy container. Clearly labeled. Filled Date: 7 / 69 / 2023 Last Medication intake:  TodaySafety precautions to be maintained throughout the outpatient stay will include: orient to surroundings, keep bed in low position, maintain call bell within reach at all times, provide assistance with transfer out of bed and ambulation.

## 2022-01-24 ENCOUNTER — Other Ambulatory Visit: Payer: Self-pay

## 2022-01-24 MED ORDER — LATANOPROST 0.005 % OP SOLN: 1.0000 [drp] | Freq: Every day | OPHTHALMIC | 0 refills | Status: DC

## 2022-02-13 ENCOUNTER — Other Ambulatory Visit: Payer: Self-pay | Admitting: Pain Medicine

## 2022-02-13 ENCOUNTER — Other Ambulatory Visit: Payer: Self-pay

## 2022-02-13 ENCOUNTER — Other Ambulatory Visit: Payer: Self-pay | Admitting: Physician Assistant

## 2022-02-13 DIAGNOSIS — Z79899 Other long term (current) drug therapy: Secondary | ICD-10-CM

## 2022-02-13 DIAGNOSIS — M797 Fibromyalgia: Secondary | ICD-10-CM

## 2022-02-13 DIAGNOSIS — R208 Other disturbances of skin sensation: Secondary | ICD-10-CM

## 2022-02-13 DIAGNOSIS — R209 Unspecified disturbances of skin sensation: Secondary | ICD-10-CM

## 2022-02-13 DIAGNOSIS — G8929 Other chronic pain: Secondary | ICD-10-CM

## 2022-02-13 MED ORDER — HYDROXYZINE HCL 50 MG PO TABS: 50.0000 mg | ORAL_TABLET | Freq: Every day | ORAL | 0 refills | Status: DC

## 2022-02-14 ENCOUNTER — Other Ambulatory Visit: Payer: Self-pay

## 2022-02-14 DIAGNOSIS — J4520 Mild intermittent asthma, uncomplicated: Secondary | ICD-10-CM

## 2022-02-14 DIAGNOSIS — F419 Anxiety disorder, unspecified: Secondary | ICD-10-CM

## 2022-02-14 MED ORDER — BUSPIRONE HCL 10 MG PO TABS: 10.0000 mg | ORAL_TABLET | Freq: Three times a day (TID) | ORAL | 0 refills | Status: DC

## 2022-02-14 MED ORDER — ALBUTEROL SULFATE HFA 108 (90 BASE) MCG/ACT IN AERS: INHALATION_SPRAY | RESPIRATORY_TRACT | 1 refills | Status: DC

## 2022-02-16 ENCOUNTER — Other Ambulatory Visit: Payer: Self-pay | Admitting: Nurse Practitioner

## 2022-02-16 DIAGNOSIS — F419 Anxiety disorder, unspecified: Secondary | ICD-10-CM

## 2022-03-05 NOTE — Progress Notes (Unsigned)
Established patient visit   Patient: Rachael West   DOB: 11-28-1955   67 y.o. Female  MRN: [id number] Visit Date: 03/06/2022   No chief complaint on file.  Subjective    HPI  Follow up visit  -hypertension  -due for routine, fasting labs today  -?bone density test needed     Medications: Outpatient Medications Prior to Visit  Medication Sig   albuterol (VENTOLIN HFA) 108 (90 Base) MCG/ACT inhaler INHALE 2 PUFFS BY MOUTH INTO THE LUNGS 4 TIMES DAILY   amLODipine (NORVASC) 5 MG tablet TAKE 1 TABLET (5 MG TOTAL) BY MOUTH DAILY.   atenolol (TENORMIN) 25 MG tablet TAKE 1 TABLET BY MOUTH EVERY DAY   b complex vitamins tablet Take 1 tablet by mouth daily.   busPIRone (BUSPAR) 10 MG tablet Take 1 tablet (10 mg total) by mouth 3 (three) times daily.   dimenhyDRINATE (DRAMAMINE) 50 MG tablet Take 50 mg by mouth every 8 (eight) hours as needed.   gabapentin (NEURONTIN) 100 MG capsule Take 2 capsules (200 mg total) by mouth at bedtime. Follow written titration schedule.   hydrOXYzine (ATARAX) 50 MG tablet Take 1 tablet (50 mg total) by mouth at bedtime.   ipratropium-albuterol (DUONEB) 0.5-2.5 (3) MG/3ML SOLN INHALE 3 MLS VIA NEBULIZER AS DIRECTED   Lactobacillus Rhamnosus, GG, (CULTURELLE) CAPS Take 1 capsule by mouth daily.   latanoprost (XALATAN) 0.005 % ophthalmic solution Place 1 drop into both eyes at bedtime.   lisinopril (ZESTRIL) 5 MG tablet TAKE 1 TABLET (5 MG TOTAL) BY MOUTH DAILY.   lubiprostone (AMITIZA) 24 MCG capsule 1 CAP 2 TIMES DAILY WITH A MEAL. SWALLOW THE MEDICATION WHOLE. DO NOT BREAK OR CHEW THE MEDICATION.   Magnesium Oxide -Mg Supplement 500 MG CAPS Take 1 capsule (500 mg total) by mouth in the morning and at bedtime. (Patient taking differently: Take 1 capsule by mouth 2 (two) times daily.)   meloxicam (MOBIC) 15 MG tablet Take 1 tablet (15 mg total) by mouth daily.   montelukast (SINGULAIR) 10 MG tablet TAKE 1 TABLET BY MOUTH EVERY DAY   naloxone (NARCAN)  nasal spray 4 mg/0.1 mL Place 1 spray into the nose as needed for up to 365 doses (for opioid-induced respiratory depresssion). In case of emergency (overdose), spray once into each nostril. If no response within 3 minutes, repeat application and call [phone].   omeprazole (PRILOSEC) 40 MG capsule 1 po qd   ondansetron (ZOFRAN) 4 MG tablet TAKE 1 TABLET BY MOUTH 2 TIMES DAILY.   paliperidone (INVEGA) 6 MG 24 hr tablet Take 6 mg by mouth every morning.   pantoprazole (PROTONIX) 40 MG tablet Take 40 mg by mouth daily.   polyethylene glycol (MIRALAX / GLYCOLAX) packet Take 17 g by mouth 2 (two) times daily.   prazosin (MINIPRESS) 1 MG capsule Take 2 mg by mouth at bedtime.   sertraline (ZOLOFT) 100 MG tablet Take 200 mg by mouth every morning.   sucralfate (CARAFATE) 1 g tablet Take 1 g by mouth 2 (two) times daily.   traMADol (ULTRAM) 50 MG tablet Take 1 tablet (50 mg total) by mouth every 6 (six) hours as needed for severe pain. Each refill must last 30 days.   traZODone (DESYREL) 50 MG tablet 1 TABLET BY MOUTH AT BEDTIME   Wheat Dextrin (BENEFIBER) POWD Take 6 g by mouth 3 (three) times daily before meals. (2 tsp = 6 g)   No facility-administered medications prior to visit.    Review of Systems  {  Labs (Optional):23779}   Objective    There were no vitals filed for this visit. There is no height or weight on file to calculate BMI.  BP Readings from Last 3 Encounters:  01/18/22 (Abnormal) 145/73  12/08/21 124/73  11/30/21 (Abnormal) 146/80    Wt Readings from Last 3 Encounters:  01/18/22 189 lb (85.7 kg)  12/08/21 189 lb (85.7 kg)  11/30/21 187 lb (84.8 kg)    Physical Exam  ***  No results found for any visits on 03/06/22.  Assessment & Plan     Problem List Items Addressed This Visit   None    No follow-ups on file.         Ronnell Freshwater, NP  [facility] [phone] (phone) [phone] (fax)  [place]

## 2022-03-06 ENCOUNTER — Ambulatory Visit (INDEPENDENT_AMBULATORY_CARE_PROVIDER_SITE_OTHER): Payer: 59 | Admitting: Nurse Practitioner

## 2022-03-06 ENCOUNTER — Encounter: Payer: Self-pay | Admitting: Nurse Practitioner

## 2022-03-06 VITALS — BP 138/81 | HR 74 | Ht 65.0 in | Wt 181.1 lb

## 2022-03-06 DIAGNOSIS — I10 Essential (primary) hypertension: Secondary | ICD-10-CM

## 2022-03-06 DIAGNOSIS — Z87898 Personal history of other specified conditions: Secondary | ICD-10-CM

## 2022-03-06 DIAGNOSIS — Z8711 Personal history of peptic ulcer disease: Secondary | ICD-10-CM

## 2022-03-06 DIAGNOSIS — F419 Anxiety disorder, unspecified: Secondary | ICD-10-CM

## 2022-03-06 DIAGNOSIS — I8500 Esophageal varices without bleeding: Secondary | ICD-10-CM

## 2022-03-06 DIAGNOSIS — K5904 Chronic idiopathic constipation: Secondary | ICD-10-CM

## 2022-03-06 DIAGNOSIS — R208 Other disturbances of skin sensation: Secondary | ICD-10-CM

## 2022-03-06 DIAGNOSIS — G8929 Other chronic pain: Secondary | ICD-10-CM

## 2022-03-06 DIAGNOSIS — J302 Other seasonal allergic rhinitis: Secondary | ICD-10-CM

## 2022-03-06 DIAGNOSIS — M792 Neuralgia and neuritis, unspecified: Secondary | ICD-10-CM

## 2022-03-06 DIAGNOSIS — M797 Fibromyalgia: Secondary | ICD-10-CM

## 2022-03-06 DIAGNOSIS — E2839 Other primary ovarian failure: Secondary | ICD-10-CM

## 2022-03-06 DIAGNOSIS — E782 Mixed hyperlipidemia: Secondary | ICD-10-CM

## 2022-03-06 DIAGNOSIS — R5383 Other fatigue: Secondary | ICD-10-CM

## 2022-03-06 DIAGNOSIS — M1991 Primary osteoarthritis, unspecified site: Secondary | ICD-10-CM

## 2022-03-06 DIAGNOSIS — Z8639 Personal history of other endocrine, nutritional and metabolic disease: Secondary | ICD-10-CM

## 2022-03-06 DIAGNOSIS — J4520 Mild intermittent asthma, uncomplicated: Secondary | ICD-10-CM | POA: Diagnosis not present

## 2022-03-06 DIAGNOSIS — R209 Unspecified disturbances of skin sensation: Secondary | ICD-10-CM

## 2022-03-06 DIAGNOSIS — Z13820 Encounter for screening for osteoporosis: Secondary | ICD-10-CM

## 2022-03-06 DIAGNOSIS — Z79899 Other long term (current) drug therapy: Secondary | ICD-10-CM

## 2022-03-06 DIAGNOSIS — E559 Vitamin D deficiency, unspecified: Secondary | ICD-10-CM

## 2022-03-06 MED ORDER — ONDANSETRON HCL 4 MG PO TABS: 4.0000 mg | ORAL_TABLET | Freq: Two times a day (BID) | ORAL | 3 refills | Status: AC

## 2022-03-06 MED ORDER — IPRATROPIUM-ALBUTEROL 0.5-2.5 (3) MG/3ML IN SOLN: RESPIRATORY_TRACT | 1 refills | Status: DC

## 2022-03-06 MED ORDER — PALIPERIDONE ER 6 MG PO TB24: 6.0000 mg | ORAL_TABLET | Freq: Every morning | ORAL | 1 refills | Status: DC

## 2022-03-06 MED ORDER — CULTURELLE PO CAPS: 1.0000 | ORAL_CAPSULE | Freq: Every day | ORAL | 1 refills | Status: AC

## 2022-03-06 MED ORDER — BUSPIRONE HCL 10 MG PO TABS: 10.0000 mg | ORAL_TABLET | Freq: Three times a day (TID) | ORAL | 1 refills | Status: DC

## 2022-03-06 MED ORDER — LISINOPRIL 5 MG PO TABS: 5.0000 mg | ORAL_TABLET | Freq: Every day | ORAL | 1 refills | Status: DC

## 2022-03-06 MED ORDER — MONTELUKAST SODIUM 10 MG PO TABS: 10.0000 mg | ORAL_TABLET | Freq: Every day | ORAL | 1 refills | Status: DC

## 2022-03-06 MED ORDER — MELOXICAM 15 MG PO TABS: 15.0000 mg | ORAL_TABLET | Freq: Every day | ORAL | 1 refills | Status: DC

## 2022-03-06 MED ORDER — LUBIPROSTONE 24 MCG PO CAPS: 24.0000 ug | ORAL_CAPSULE | Freq: Two times a day (BID) | ORAL | 1 refills | Status: DC

## 2022-03-06 MED ORDER — SUCRALFATE 1 G PO TABS: 1.0000 g | ORAL_TABLET | Freq: Two times a day (BID) | ORAL | 1 refills | Status: DC

## 2022-03-06 MED ORDER — ALBUTEROL SULFATE HFA 108 (90 BASE) MCG/ACT IN AERS: INHALATION_SPRAY | RESPIRATORY_TRACT | 8 refills | Status: DC

## 2022-03-06 MED ORDER — GABAPENTIN 100 MG PO CAPS: 200.0000 mg | ORAL_CAPSULE | Freq: Every day | ORAL | 1 refills | Status: DC

## 2022-03-06 MED ORDER — OMEPRAZOLE 40 MG PO CPDR: DELAYED_RELEASE_CAPSULE | ORAL | 1 refills | Status: DC

## 2022-03-07 LAB — COMPREHENSIVE METABOLIC PANEL
ALT: 9 IU/L (ref 0–32)
AST: 15 IU/L (ref 0–40)
Albumin/Globulin Ratio: 1.6 (ref 1.2–2.2)
Albumin: 4.4 g/dL (ref 3.9–4.9)
Alkaline Phosphatase: 109 IU/L (ref 44–121)
BUN/Creatinine Ratio: 15 (ref 12–28)
BUN: 12 mg/dL (ref 8–27)
Bilirubin Total: <0.2 mg/dL (ref 0.0–1.2)
CO2: 26 mmol/L (ref 20–29)
Calcium: 9.4 mg/dL (ref 8.7–10.3)
Chloride: 101 mmol/L (ref 96–106)
Creatinine, Ser: 0.82 mg/dL (ref 0.57–1.00)
Globulin, Total: 2.8 g/dL (ref 1.5–4.5)
Glucose: 89 mg/dL (ref 70–99)
Potassium: 4.7 mmol/L (ref 3.5–5.2)
Sodium: 139 mmol/L (ref 134–144)
Total Protein: 7.2 g/dL (ref 6.0–8.5)
eGFR: 78 mL/min/{1.73_m2} (ref >59)

## 2022-03-07 LAB — HEMOGLOBIN A1C
Est. average glucose Bld gHb Est-mCnc: 117 mg/dL
Hgb A1c MFr Bld: 5.7 % — ABNORMAL HIGH (ref 4.8–5.6)

## 2022-03-07 LAB — LIPID PANEL
Chol/HDL Ratio: 3.8 ratio (ref 0.0–4.4)
Cholesterol, Total: 186 mg/dL (ref 100–199)
HDL: 49 mg/dL (ref >39)
LDL Chol Calc (NIH): 121 mg/dL — ABNORMAL HIGH (ref 0–99)
Triglycerides: 87 mg/dL (ref 0–149)
VLDL Cholesterol Cal: 16 mg/dL (ref 5–40)

## 2022-03-07 LAB — CBC
Hematocrit: 37.6 % (ref 34.0–46.6)
Hemoglobin: 11.7 g/dL (ref 11.1–15.9)
MCH: 25.2 pg — ABNORMAL LOW (ref 26.6–33.0)
MCHC: 31.1 g/dL — ABNORMAL LOW (ref 31.5–35.7)
MCV: 81 fL (ref 79–97)
Platelets: 257 10*3/uL (ref 150–450)
RBC: 4.64 x10E6/uL (ref 3.77–5.28)
RDW: 13.5 % (ref 11.7–15.4)
WBC: 4.8 10*3/uL (ref 3.4–10.8)

## 2022-03-07 LAB — TSH+FREE T4
Free T4: 0.91 ng/dL (ref 0.82–1.77)
TSH: 0.785 u[IU]/mL (ref 0.450–4.500)

## 2022-03-07 LAB — VITAMIN D 25 HYDROXY (VIT D DEFICIENCY, FRACTURES): Vit D, 25-Hydroxy: 16.7 ng/mL — ABNORMAL LOW (ref 30.0–100.0)

## 2022-03-15 ENCOUNTER — Other Ambulatory Visit: Payer: Self-pay

## 2022-03-15 DIAGNOSIS — I10 Essential (primary) hypertension: Secondary | ICD-10-CM

## 2022-03-15 MED ORDER — ATENOLOL 25 MG PO TABS: 25.0000 mg | ORAL_TABLET | Freq: Every day | ORAL | 1 refills | Status: DC

## 2022-03-20 ENCOUNTER — Other Ambulatory Visit: Payer: Self-pay | Admitting: Nurse Practitioner

## 2022-04-11 ENCOUNTER — Other Ambulatory Visit: Payer: Medicare Other

## 2022-04-11 ENCOUNTER — Other Ambulatory Visit: Payer: Self-pay | Admitting: Nurse Practitioner

## 2022-04-11 DIAGNOSIS — M810 Age-related osteoporosis without current pathological fracture: Secondary | ICD-10-CM

## 2022-04-25 ENCOUNTER — Encounter: Payer: Self-pay | Admitting: Family Medicine

## 2022-04-25 ENCOUNTER — Ambulatory Visit (INDEPENDENT_AMBULATORY_CARE_PROVIDER_SITE_OTHER): Payer: 59 | Admitting: Family Medicine

## 2022-04-25 VITALS — BP 104/69 | HR 68 | Resp 18 | Ht 65.0 in | Wt 179.0 lb

## 2022-04-25 DIAGNOSIS — R109 Unspecified abdominal pain: Secondary | ICD-10-CM

## 2022-04-25 DIAGNOSIS — J4520 Mild intermittent asthma, uncomplicated: Secondary | ICD-10-CM | POA: Diagnosis not present

## 2022-04-25 DIAGNOSIS — R300 Dysuria: Secondary | ICD-10-CM

## 2022-04-25 DIAGNOSIS — R319 Hematuria, unspecified: Secondary | ICD-10-CM

## 2022-04-25 LAB — POCT URINALYSIS DIPSTICK
Bilirubin, UA: NEGATIVE
Glucose, UA: NEGATIVE
Ketones, UA: NEGATIVE
Nitrite, UA: NEGATIVE
Protein, UA: NEGATIVE
Spec Grav, UA: <=1.005 — AB (ref 1.010–1.025)
Urobilinogen, UA: 0.2 E.U./dL
pH, UA: 5.5 (ref 5.0–8.0)

## 2022-04-25 MED ORDER — CIPROFLOXACIN HCL 500 MG PO TABS
500.0000 mg | ORAL_TABLET | Freq: Two times a day (BID) | ORAL | 0 refills | Status: AC
Start: 2022-04-25 — End: 2022-04-30

## 2022-04-25 MED ORDER — ALBUTEROL SULFATE HFA 108 (90 BASE) MCG/ACT IN AERS
2.0000 | INHALATION_SPRAY | Freq: Four times a day (QID) | RESPIRATORY_TRACT | 2 refills | Status: DC | PRN
Start: 2022-04-25 — End: 2022-05-22

## 2022-04-25 NOTE — Progress Notes (Signed)
Acute Office Visit  Subjective:     Patient ID: Rachael West, female    DOB: 12-24-1955, 67 y.o.   MRN: [id number]  Chief Complaint  Patient presents with   Back Pain   Nausea         HPI Patient is in today for left back pain.  Past medical history is significant for chronic low back pain with left-sided sciatica and chronic neck pain with pain radiating down the left shoulder, arm, and fingers.  Currently established with pain management.  At last visit, rated chronic pain severity 6 out of 10 and described as aching, burning, constant, nagging, throbbing, stabbing, sharp. Today, patient complaining of left-sided flank pain that began about 2 weeks ago along with associated nausea.  She rates this pain is about a 6 out of 10 and it is different than her chronic back pain.  She also endorses dysuria and urinary frequency when the pain first started, she took some over-the-counter Azo has increased fluids and been drinking cranberry juice at home.  The dysuria has decreased, but she is still experiencing flank pain.  It is a dull pain now rather than sharp and continues to improve, but she does want to ensure that she does not have an infection.  She did have some abdominal pain in the past few days, but she took her IBS medication and this was completely relieved. Urinary Tract Infection: Patient complains of burning with urination, left flank pain, frequency, and nausea She has had symptoms for 2 weeks.  Patient denies back pain, congestion, cough, fever, headache, rhinitis, sorethroat, stomach ache, and vaginal discharge. Patient does not have a history of recurrent UTI.  Patient does not have a history of pyelonephritis.   ROS Negative unless otherwise noted in HPI     Objective:    BP 104/69 (BP Location: Left Arm, Patient Position: Sitting, Cuff Size: Normal)   Pulse 68   Resp 18   Ht  (1.651 m)   Wt 179 lb (81.2 kg)   SpO2 96%   BMI 29.79 kg/m   Physical  Exam Constitutional:      General: She is not in acute distress.    Appearance: Normal appearance.  HENT:     Head: Normocephalic and atraumatic.  Cardiovascular:     Rate and Rhythm: Normal rate and regular rhythm.     Heart sounds: No murmur heard.    No friction rub. No gallop.  Pulmonary:     Effort: Pulmonary effort is normal. No respiratory distress.     Breath sounds: No wheezing, rhonchi or rales.  Abdominal:     General: There is no distension.     Tenderness: There is no abdominal tenderness. There is left CVA tenderness. There is no right CVA tenderness, guarding or rebound.  Skin:    General: Skin is warm and dry.  Neurological:     Mental Status: She is alert and oriented to person, place, and time.  Psychiatric:        Mood and Affect: Mood normal.    Results for orders placed or performed in visit on 04/25/22  POCT Urinalysis Dipstick  Result Value Ref Range   Color, UA Yellow    Clarity, UA Clear    Glucose, UA Negative Negative   Bilirubin, UA Negative    Ketones, UA Negative    Spec Grav, UA <=1.005 (A) 1.010 - 1.025   Blood, UA Trace-intact    pH, UA 5.5 5.0 -  8.0   Protein, UA Negative Negative   Urobilinogen, UA 0.2 0.2 or 1.0 E.U./dL   Nitrite, UA Negative    Leukocytes, UA Trace (A) Negative   Appearance     Odor        Assessment & Plan:  Intermittent asthma without complication, unspecified asthma severity Assessment & Plan: Stable, continue montelukast 10 mg daily for maintenance.  Patient does not need a refill of DuoNeb at this time.  Per insurance preference, switched rescue inhaler from albuterol alone to ProAir rescue inhaler.  Will continue to monitor.  Orders: -     Albuterol Sulfate HFA; Inhale 2 puffs into the lungs every 6 (six) hours as needed for wheezing or shortness of breath.  Dispense: 1 each; Refill: 2  Acute left flank pain -     Ciprofloxacin HCl; Take 1 tablet (500 mg total) by mouth 2 (two) times daily for 5 days.   Dispense: 10 tablet; Refill: 0  Dysuria -     POCT urinalysis dipstick -     Urine Culture -     Ciprofloxacin HCl; Take 1 tablet (500 mg total) by mouth 2 (two) times daily for 5 days.  Dispense: 10 tablet; Refill: 0  Hematuria, unspecified type -     Urine Culture -     Ciprofloxacin HCl; Take 1 tablet (500 mg total) by mouth 2 (two) times daily for 5 days.  Dispense: 10 tablet; Refill: 0  Given flank pain, nausea, dysuria, and urinalysis positive for trace blood and leukocytes, starting 5-day course of ciprofloxacin.  Ciprofloxacin best option because alternatives including cephalosporins contraindicated due to penicillin allergy and Bactrim contraindicated due to sulfa allergy.  We discussed that if the antibiotic course does not completely resolve the pain, patient will let me know and we will get an ultrasound or CT.  Patient verbalized understanding and is agreeable to this plan.  Return in about 2 months (around 07/05/2022) for follow-up, blood work (not fasting) - already scheduled.  Melida Quitter, PA

## 2022-04-25 NOTE — Assessment & Plan Note (Signed)
Stable, continue montelukast 10 mg daily for maintenance.  Patient does not need a refill of DuoNeb at this time.  Per insurance preference, switched rescue inhaler from albuterol alone to ProAir rescue inhaler.  Will continue to monitor.

## 2022-04-25 NOTE — Patient Instructions (Signed)
Please let me know if the antibiotic does not take care of the pain and we will get some imaging done.  Continue drinking lots of water and cranberry juice while you are taking the antibiotics.

## 2022-04-27 LAB — URINE CULTURE: Organism ID, Bacteria: NO GROWTH

## 2022-05-07 NOTE — Progress Notes (Unsigned)
PROVIDER NOTE: Information contained herein reflects review and annotations entered in association with encounter. Interpretation of such information and data should be left to medically-trained personnel. Information provided to patient can be located elsewhere in the medical record under "Patient Instructions". Document created using STT-dictation technology, any transcriptional errors that may result from process are unintentional.    Patient: Rachael West  Service Category: E/M  Provider: Oswaldo Done, MD  DOB: [birth year]  DOS: 05/08/2022  Referring Provider: Melida Quitter, PA  MRN: [id number]  Specialty: Interventional Pain Management  PCP: Melida Quitter, PA  Type: Established Patient  Setting: Ambulatory outpatient    Location: Office  Delivery: Face-to-face     HPI  [name], a 67 y.o. year old female, is here today because of her No primary diagnosis found.. Ms. Swetz primary complain today is No chief complaint on file.  Pertinent problems: Ms. Rambeau has Large B-cell lymphoma (HCC); Chronic low back pain (Primary Area of Pain) (Bilateral) w/ sciatica (Left); Chronic lower extremity pain (2ry area of Pain) (Left); Chronic upper back pain (3ry area of Pain) (Bilateral) (L>R); Chronic neck pain (4th area of Pain) (Bilateral) (L>R); Occipital headache; Chronic knee pain (Bilateral) (R>L); Chronic pain syndrome; Chronic sacroiliac joint pain; DDD (degenerative disc disease), lumbar; Lumbar facet arthropathy; Lumbar facet syndrome (Bilateral) (L>R); Spondylosis without myelopathy or radiculopathy, lumbar region; DDD (degenerative disc disease), thoracic; Thoracolumbar dextroscoliosis; Cervicogenic headache; Tendinitis of rotator cuff (Left); Tendinitis of rotator cuff (Right); Fibromyalgia; Chronic shoulder pain (Bilateral) (R>L); Chronic hip pain (Bilateral) (L>R); Cervicalgia; Chronic idiopathic gout; DDD (degenerative disc disease), cervical;  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); Cervical facet syndrome (Bilateral) (R>L); Spondylosis without myelopathy or radiculopathy, cervical region; Chronic musculoskeletal pain; Chronic low back pain (1ry area of Pain) (Bilateral) (L>R) w/o sciatica; Cervical disc disorder at C5-C6 level with radiculopathy; Cervical radiculopathy at C6; Osteoarthrosis of multiple sites; Radicular pain of upper extremity (Right); Chronic upper extremity pain (Right); Chronic shoulder pain (Right); Arthralgia of shoulder region (Right); Shoulder enthesopathy (Right); Musculoskeletal disorder involving upper trapezius muscle (Right); Chronic neuropathic pain; Disturbance of skin sensation; Hyperalgesia; Trigger finger, middle finger (Right); Trigger point of shoulder region (Right); Carpal tunnel syndrome (Bilateral); Chronic shoulder pain (Left); Osteoarthritis of glenohumeral joint (Right); Primary osteoarthritis of shoulder (Right); Rotator cuff arthropathy of shoulder (Right); and Tendinopathy of rotator cuff (Right) on their pertinent problem list. Pain Assessment: Severity of   is reported as a  /10. Location:    / . Onset:  . Quality:  . Timing:  . Modifying factor(s):  Marland Kitchen Vitals:  vitals were not taken for this visit.  BMI: Estimated body mass index is 29.79 kg/m as calculated from the following:   Height as of 04/25/22: 5\' 5"  (1.651 m).   Weight as of 04/25/22: 179 lb (81.2 kg). Last encounter: 01/18/2022. Last procedure: 12/08/2021.  Reason for encounter: evaluation of worsening, or previously known (established) problem. ***  LBP, LLEP, shoulder pain.  Pharmacotherapy Assessment  Analgesic: Tramadol 50 mg, 1-2 tabs (50-100 mg) PO q 6 hrs (400 mg/day of tramadol) MME/day: 40 mg/day.   Monitoring: [place] PMP: PDMP reviewed during this encounter.       Pharmacotherapy: No side-effects or adverse reactions reported. Compliance: No problems identified. Effectiveness: Clinically acceptable.  No  notes on file  No results found for: "CBDTHCR" No results found for: "D8THCCBX" No results found for: "D9THCCBX"  UDS:  Summary  Date Value Ref Range Status  06/27/2021 Note  Final  Comment:    ==================================================================== [employer] Select 13 (MW) ==================================================================== Test                             Result       Flag       Units  Drug Present and Declared for Prescription Verification   Tramadol                       >5435        EXPECTED   ng/mg creat   O-Desmethyltramadol            >5435        EXPECTED   ng/mg creat   N-Desmethyltramadol            >5435        EXPECTED   ng/mg creat    Source of tramadol is a prescription medication. O-desmethyltramadol    and N-desmethyltramadol are expected metabolites of tramadol.  ==================================================================== Test                      Result    Flag   Units      Ref Range   Creatinine              92               mg/dL      >=69 ==================================================================== Declared Medications:  The flagging and interpretation on this report are based on the  following declared medications.  Unexpected results may arise from  inaccuracies in the declared medications.   **Note: The testing scope of this panel includes these medications:   Tramadol (Ultram)   **Note: The testing scope of this panel does not include the  following reported medications:   Albuterol (Ventolin HFA)  Albuterol (Duoneb)  Amlodipine (Norvasc)  Atenolol (Tenormin)  Buspirone (Buspar)  Dimenhydrinate  Fluticasone (Advair)  Gabapentin (Neurontin)  Hydroxyzine (Atarax)  Ipratropium (Duoneb)  Latanoprost (Xalatan)  Lisinopril (Zestril)  Loratadine (Claritin)  Lubiprostone (Amitiza)  Magnesium (Mag-Ox)  Meloxicam (Mobic)  Montelukast (Singulair)  Omeprazole (Prilosec)  Ondansetron (Zofran)   Paliperidone (Invega)  Polyethylene Glycol (MiraLAX)  Prazosin (Minipress)  Probiotic  Salmeterol (Advair)  Sertraline (Zoloft)  Supplement  Trazodone (Desyrel)  Vitamin B ==================================================================== For clinical consultation, please call [phone]. ====================================================================       ROS  Constitutional: Denies any fever or chills Gastrointestinal: No reported hemesis, hematochezia, vomiting, or acute GI distress Musculoskeletal: Denies any acute onset joint swelling, redness, loss of ROM, or weakness Neurological: No reported episodes of acute onset apraxia, aphasia, dysarthria, agnosia, amnesia, paralysis, loss of coordination, or loss of consciousness  Medication Review  Benefiber, Culturelle, Magnesium Oxide -Mg Supplement, albuterol, amLODipine, atenolol, b complex vitamins, busPIRone, dimenhyDRINATE, fluticasone-salmeterol, gabapentin, hydrOXYzine, ipratropium-albuterol, latanoprost, lisinopril, lubiprostone, meloxicam, montelukast, naloxone, omeprazole, ondansetron, paliperidone, pantoprazole, polyethylene glycol, prazosin, sertraline, sucralfate, traMADol, and traZODone  History Review  Allergy: Ms. Cobbins is allergic to latex, penicillins, codeine, cortisone, and sulfa antibiotics. Drug: Ms. Lastinger  reports no history of drug use. Alcohol:  reports no history of alcohol use. Tobacco:  reports that she quit smoking about 32 years ago. Her smoking use included cigarettes. She has a 30.00 pack-year smoking history. She has been exposed to tobacco smoke. She has never used smokeless tobacco. Social: Ms. Pigue  reports that she quit smoking about 32 years ago. Her smoking use included cigarettes. She has a 30.00  pack-year smoking history. She has been exposed to tobacco smoke. She has never used smokeless tobacco. She reports that she does not drink alcohol and does not use  drugs. Medical:  has a past medical history of Anemia, Anxiety, Arthritis, Asthma, Depression, GERD (gastroesophageal reflux disease), Hepatitis-C, History of chemotherapy, Hypertension, Insomnia, Lymphoma (HCC) (12/2013), Personal history of non-Hodgkin lymphomas, and Vitamin D deficiency. Surgical: Ms. Jezek  has a past surgical history that includes Appendectomy; Trigger finger release; Esophagogastroduodenoscopy (01/17/2011); Colonoscopy (01/17/2011); Abdominal hysterectomy; and Breast biopsy (Left, 2015). Family: family history includes Allergies in her mother; Asthma in her mother and sister; Breast cancer in her cousin; Colon cancer in her paternal aunt; Diabetes in her maternal grandfather and maternal grandmother; Heart disease in her brother, father, paternal grandfather, and paternal grandmother; Liver cancer in her paternal uncle; Ovarian cancer in an other family member; Pancreatic cancer in an other family member.  Laboratory Chemistry Profile   Renal Lab Results  Component Value Date   BUN 12 03/06/2022   CREATININE 0.82 03/06/2022   BCR 15 03/06/2022   GFRAA >60 08/05/2019   GFRNONAA >60 08/12/2020    Hepatic Lab Results  Component Value Date   AST 15 03/06/2022   ALT 9 03/06/2022   ALBUMIN 4.4 03/06/2022   ALKPHOS 109 03/06/2022   HCVAB REACTIVE (A) 01/12/2014   LIPASE 34 10/31/2010    Electrolytes Lab Results  Component Value Date   NA 139 03/06/2022   K 4.7 03/06/2022   CL 101 03/06/2022   CALCIUM 9.4 03/06/2022   MG 2.2 08/29/2017    Bone Lab Results  Component Value Date   VD25OH 16.7 (L) 03/06/2022   25OHVITD1 21 (L) 08/29/2017   25OHVITD2 <1.0 08/29/2017   25OHVITD3 21 08/29/2017    Inflammation (CRP: Acute Phase) (ESR: Chronic Phase) Lab Results  Component Value Date   CRP 12 (H) 08/29/2017   ESRSEDRATE 46 (H) 08/29/2017         Note: Above Lab results reviewed.  Recent Imaging Review  DG PAIN CLINIC C-ARM 1-60 MIN NO REPORT Fluoro was  used, but no Radiologist interpretation will be provided.  Please refer to "NOTES" tab for provider progress note. Note: Reviewed        Physical Exam  General appearance: Well nourished, well developed, and well hydrated. In no apparent acute distress Mental status: Alert, oriented x 3 (person, place, & time)       Respiratory: No evidence of acute respiratory distress Eyes: PERLA Vitals: There were no vitals taken for this visit. BMI: Estimated body mass index is 29.79 kg/m as calculated from the following:   Height as of 04/25/22: 5\' 5"  (1.651 m).   Weight as of 04/25/22: 179 lb (81.2 kg). Ideal: Ideal body weight: 57 kg (125 lb 10.6 oz) Adjusted ideal body weight: 66.7 kg (147 lb)  Assessment   Diagnosis Status  No diagnosis found. Controlled Controlled Controlled   Updated Problems: No problems updated.  Plan of Care  Problem-specific:  No problem-specific Assessment & Plan notes found for this encounter.  Ms. FATEN FRIESON has a current medication list which includes the following long-term medication(s): albuterol, amlodipine, atenolol, gabapentin, ipratropium-albuterol, lisinopril, lubiprostone, magnesium oxide -mg supplement, meloxicam, montelukast, omeprazole, paliperidone, prazosin, sertraline, sucralfate, tramadol, trazodone, and benefiber.  Pharmacotherapy (Medications Ordered): No orders of the defined types were placed in this encounter.  Orders:  No orders of the defined types were placed in this encounter.  Follow-up plan:   No follow-ups on file.  Interventional Therapies  Risk Factors  Complex Considerations:   Allergy: LATEX  Hx Hepatitis C (w/ cirrhosis)   Planned  Pending:      Under consideration:   Diagnostic/therapeutic left cervical ESI #2  Therapeutic left caudal ESI #2  Therapeutic right cervical ESI #3  Therapeutic right Racz procedure #1  Diagnostic right suprascapular NB #1  Therapeutic bilateral lumbar facet RFA #1  (starting on left side)  Therapeutic bilateral cervical facet RFA #1 (starting on left side)    Completed:   Therapeutic right glenohumeral/AC  inj. x4 (12/08/2021) (100/100/100/100)  Therapeutic bilateral lumbar facet MBB x3 (06/03/2019) (100/100/90/90)  Diagnostic bilateral cervical facet MBB x1 (11/06/2017) (100/100/90)  Diagnostic left caudal ESI x1 (08/14/2019) (100/100/100 x8 days/90-100)  Therapeutic right cervical ESI x3 (04/12/2021) (100/100/80-95/80-95)  Therapeutic left cervical ESI x1 (07/19/2021) (100/100/100/100) (helped neck, shoulders, headache, and arms)    Therapeutic  Palliative (PRN) options:   Therapeutic/palliative glenohumeral/AC  inj.   Therapeutic/palliative lumbar facet MBB   Therapeutic/palliative cervical facet MBB   Therapeutic/palliative caudal ESI   Therapeutic/palliative cervical ESI    Pharmacotherapy:  Nonopioids transfer 10/27/2019: Benefiber, Amitiza, magnesium, Mobic, and Neurontin        Recent Visits No visits were found meeting these conditions. Showing recent visits within past 90 days and meeting all other requirements Future Appointments Date Type Provider Dept  05/08/22 Appointment Delano Metz, MD [facility]  07/19/22 Appointment Delano Metz, MD [facility]  Showing future appointments within next 90 days and meeting all other requirements  I discussed the assessment and treatment plan with the patient. The patient was provided an opportunity to ask questions and all were answered. The patient agreed with the plan and demonstrated an understanding of the instructions.  Patient advised to call back or seek an in-person evaluation if the symptoms or condition worsens.  Duration of encounter: *** minutes.  Total time on encounter, as per AMA guidelines included both the face-to-face and non-face-to-face time personally spent by the physician and/or other qualified health care professional(s) on the day of  the encounter (includes time in activities that require the physician or other qualified health care professional and does not include time in activities normally performed by clinical staff). Physician's time may include the following activities when performed: Preparing to see the patient (e.g., pre-charting review of records, searching for previously ordered imaging, lab work, and nerve conduction tests) Review of prior analgesic pharmacotherapies. Reviewing PMP Interpreting ordered tests (e.g., lab work, imaging, nerve conduction tests) Performing post-procedure evaluations, including interpretation of diagnostic procedures Obtaining and/or reviewing separately obtained history Performing a medically appropriate examination and/or evaluation Counseling and educating the patient/family/caregiver Ordering medications, tests, or procedures Referring and communicating with other health care professionals (when not separately reported) Documenting clinical information in the electronic or other health record Independently interpreting results (not separately reported) and communicating results to the patient/ family/caregiver Care coordination (not separately reported)  Note by: Oswaldo Done, MD Date: 05/08/2022; Time: 11:32 AM

## 2022-05-08 ENCOUNTER — Ambulatory Visit: Payer: 59 | Attending: Pain Medicine | Admitting: Pain Medicine

## 2022-05-08 ENCOUNTER — Encounter: Payer: Self-pay | Admitting: Pain Medicine

## 2022-05-08 VITALS — BP 138/81 | HR 80 | Temp 97.5°F | Ht 65.0 in | Wt 179.0 lb

## 2022-05-08 DIAGNOSIS — M5412 Radiculopathy, cervical region: Secondary | ICD-10-CM | POA: Diagnosis not present

## 2022-05-08 DIAGNOSIS — M4802 Spinal stenosis, cervical region: Secondary | ICD-10-CM | POA: Insufficient documentation

## 2022-05-08 DIAGNOSIS — M50122 Cervical disc disorder at C5-C6 level with radiculopathy: Secondary | ICD-10-CM | POA: Insufficient documentation

## 2022-05-08 DIAGNOSIS — M5136 Other intervertebral disc degeneration, lumbar region: Secondary | ICD-10-CM | POA: Diagnosis not present

## 2022-05-08 DIAGNOSIS — M5417 Radiculopathy, lumbosacral region: Secondary | ICD-10-CM | POA: Insufficient documentation

## 2022-05-08 DIAGNOSIS — G8929 Other chronic pain: Secondary | ICD-10-CM | POA: Insufficient documentation

## 2022-05-08 DIAGNOSIS — M5030 Other cervical disc degeneration, unspecified cervical region: Secondary | ICD-10-CM | POA: Diagnosis not present

## 2022-05-08 DIAGNOSIS — M79605 Pain in left leg: Secondary | ICD-10-CM | POA: Diagnosis not present

## 2022-05-08 DIAGNOSIS — M542 Cervicalgia: Secondary | ICD-10-CM | POA: Diagnosis not present

## 2022-05-08 DIAGNOSIS — M47812 Spondylosis without myelopathy or radiculopathy, cervical region: Secondary | ICD-10-CM | POA: Diagnosis not present

## 2022-05-08 DIAGNOSIS — M5442 Lumbago with sciatica, left side: Secondary | ICD-10-CM | POA: Diagnosis not present

## 2022-05-08 NOTE — Progress Notes (Signed)
Nursing Pain Medication Assessment:  Safety precautions to be maintained throughout the outpatient stay will include: orient to surroundings, keep bed in low position, maintain call bell within reach at all times, provide assistance with transfer out of bed and ambulation.  Medication Inspection Compliance: Pill count conducted under aseptic conditions, in front of the patient. Neither the pills nor the bottle was removed from the patient's sight at any time. Once count was completed pills were immediately returned to the patient in their original bottle.  Medication: Tramadol (Ultram) Pill/Patch Count:  2 of 120 pills remain Pill/Patch Appearance: Markings consistent with prescribed medication Bottle Appearance: Standard pharmacy container. Clearly labeled. Filled Date: 3 / 22 / 2024 Last Medication intake:  TodaySafety precautions to be maintained throughout the outpatient stay will include: orient to surroundings, keep bed in low position, maintain call bell within reach at all times, provide assistance with transfer out of bed and ambulation.

## 2022-05-08 NOTE — Patient Instructions (Signed)

## 2022-05-12 ENCOUNTER — Ambulatory Visit
Admission: RE | Admit: 2022-05-12 | Discharge: 2022-05-12 | Disposition: A | Discharge status: Home / Self Care | Payer: 59 | Source: Ambulatory Visit | Attending: Pain Medicine | Admitting: Pain Medicine

## 2022-05-12 DIAGNOSIS — M79605 Pain in left leg: Secondary | ICD-10-CM | POA: Insufficient documentation

## 2022-05-12 DIAGNOSIS — M5136 Other intervertebral disc degeneration, lumbar region: Secondary | ICD-10-CM | POA: Diagnosis not present

## 2022-05-12 DIAGNOSIS — G8929 Other chronic pain: Secondary | ICD-10-CM | POA: Diagnosis not present

## 2022-05-12 DIAGNOSIS — M5442 Lumbago with sciatica, left side: Secondary | ICD-10-CM | POA: Insufficient documentation

## 2022-05-12 DIAGNOSIS — M545 Low back pain, unspecified: Secondary | ICD-10-CM | POA: Diagnosis not present

## 2022-05-15 ENCOUNTER — Other Ambulatory Visit: Payer: Self-pay | Admitting: Nurse Practitioner

## 2022-05-20 ENCOUNTER — Other Ambulatory Visit: Payer: Self-pay | Admitting: Nurse Practitioner

## 2022-05-20 DIAGNOSIS — J4520 Mild intermittent asthma, uncomplicated: Secondary | ICD-10-CM

## 2022-05-21 ENCOUNTER — Other Ambulatory Visit: Payer: Self-pay | Admitting: Nurse Practitioner

## 2022-05-21 DIAGNOSIS — J4520 Mild intermittent asthma, uncomplicated: Secondary | ICD-10-CM

## 2022-06-06 ENCOUNTER — Other Ambulatory Visit: Payer: Self-pay | Admitting: Family Medicine

## 2022-06-06 DIAGNOSIS — Z1231 Encounter for screening mammogram for malignant neoplasm of breast: Secondary | ICD-10-CM

## 2022-06-11 ENCOUNTER — Other Ambulatory Visit: Payer: Self-pay | Admitting: Family Medicine

## 2022-06-15 ENCOUNTER — Ambulatory Visit
Admission: RE | Admit: 2022-06-15 | Discharge: 2022-06-15 | Disposition: A | Discharge status: Home / Self Care | Payer: 59 | Source: Ambulatory Visit | Attending: Pain Medicine | Admitting: Pain Medicine

## 2022-06-15 ENCOUNTER — Ambulatory Visit: Payer: 59 | Attending: Pain Medicine | Admitting: Pain Medicine

## 2022-06-15 ENCOUNTER — Encounter: Payer: Self-pay | Admitting: Pain Medicine

## 2022-06-15 VITALS — BP 158/80 | HR 89 | Temp 97.5°F | Resp 16 | Ht 65.0 in | Wt 179.0 lb

## 2022-06-15 DIAGNOSIS — Z91040 Latex allergy status: Secondary | ICD-10-CM | POA: Diagnosis not present

## 2022-06-15 DIAGNOSIS — M79605 Pain in left leg: Secondary | ICD-10-CM | POA: Diagnosis not present

## 2022-06-15 DIAGNOSIS — R937 Abnormal findings on diagnostic imaging of other parts of musculoskeletal system: Secondary | ICD-10-CM | POA: Insufficient documentation

## 2022-06-15 DIAGNOSIS — M5136 Other intervertebral disc degeneration, lumbar region: Secondary | ICD-10-CM | POA: Diagnosis not present

## 2022-06-15 DIAGNOSIS — M5442 Lumbago with sciatica, left side: Secondary | ICD-10-CM | POA: Insufficient documentation

## 2022-06-15 DIAGNOSIS — G8929 Other chronic pain: Secondary | ICD-10-CM | POA: Diagnosis not present

## 2022-06-15 DIAGNOSIS — M5417 Radiculopathy, lumbosacral region: Secondary | ICD-10-CM | POA: Insufficient documentation

## 2022-06-15 DIAGNOSIS — Z5189 Encounter for other specified aftercare: Secondary | ICD-10-CM | POA: Insufficient documentation

## 2022-06-15 MED ORDER — ROPIVACAINE HCL 2 MG/ML IJ SOLN
2.0000 mL | Freq: Once | INTRAMUSCULAR | Status: AC
  Administered 2022-06-15: 2 mL via EPIDURAL
  Filled 2022-06-15: qty 20

## 2022-06-15 MED ORDER — MIDAZOLAM HCL 2 MG/2ML IJ SOLN
0.5000 mg | Freq: Once | INTRAMUSCULAR | Status: AC
  Administered 2022-06-15: 2 mg via INTRAVENOUS
  Filled 2022-06-15: qty 2

## 2022-06-15 MED ORDER — PENTAFLUOROPROP-TETRAFLUOROETH EX AERO
INHALATION_SPRAY | Freq: Once | CUTANEOUS | Status: AC
  Administered 2022-06-15: 30 via TOPICAL
  Filled 2022-06-15: qty 116

## 2022-06-15 MED ORDER — IOHEXOL 180 MG/ML  SOLN
10.0000 mL | Freq: Once | INTRAMUSCULAR | Status: AC
  Administered 2022-06-15: 10 mL via EPIDURAL
  Filled 2022-06-15: qty 20

## 2022-06-15 MED ORDER — TRIAMCINOLONE ACETONIDE 40 MG/ML IJ SUSP
40.0000 mg | Freq: Once | INTRAMUSCULAR | Status: AC
  Administered 2022-06-15: 40 mg
  Filled 2022-06-15: qty 1

## 2022-06-15 MED ORDER — SODIUM CHLORIDE 0.9% FLUSH
2.0000 mL | Freq: Once | INTRAVENOUS | Status: AC
  Administered 2022-06-15: 2 mL

## 2022-06-15 MED ORDER — LIDOCAINE HCL 2 % IJ SOLN
20.0000 mL | Freq: Once | INTRAMUSCULAR | Status: AC
  Administered 2022-06-15: 400 mg
  Filled 2022-06-15: qty 20

## 2022-06-15 MED ORDER — LACTATED RINGERS IV SOLN: Freq: Once | INTRAVENOUS | Status: AC

## 2022-06-15 NOTE — Patient Instructions (Signed)

## 2022-06-15 NOTE — Progress Notes (Addendum)
PROVIDER NOTE: Interpretation of information contained herein should be left to medically-trained personnel. Specific patient instructions are provided elsewhere under "Patient Instructions" section of medical record. This document was created in part using STT-dictation technology, any transcriptional errors that may result from this process are unintentional.  Patient: Rachael West Type: Established DOB: 06-May-1955 MRN: [id number] PCP: Melida Quitter, PA  Service: Procedure DOS: 06/15/2022 Setting: Ambulatory Location: Ambulatory outpatient facility Delivery: Face-to-face Provider: Oswaldo Done, MD Specialty: Interventional Pain Management Specialty designation: 09 Location: Outpatient facility Ref. Prov.: Delano Metz, MD       Interventional Therapy   Procedure: Lumbar epidural steroid injection (LESI) (interlaminar) #1    Laterality: Left   Level:  L4-5 Level.  Imaging: Fluoroscopic guidance         Anesthesia: Local anesthesia (1-2% Lidocaine) Anxiolysis: IV Versed 1.0 mg Sedation: No Sedation                       DOS: 06/15/2022  Performed by: Oswaldo Done, MD  Purpose: Diagnostic/Therapeutic Indications: Lumbar radicular pain of intraspinal etiology of more than 4 weeks that has failed to respond to conservative therapy and is severe enough to impact quality of life or function. 1. 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 Chronic lower extremity pain (2ry area of Pain) (Left)   3. DDD (degenerative disc disease), lumbar   4. Lumbosacral sensory radiculopathy at L5 (Left)   5. Abnormal MRI, lumbar spine (05/17/2022)   6. Latex precautions, history of latex allergy   7. Encounter for therapeutic procedure    NAS-11 Pain score:   Pre-procedure: 6 /10   Post-procedure: 6 /10      Position / Prep / Materials:  Position: Prone w/ head of the table raised (slight reverse trendelenburg) to facilitate  breathing.  Prep solution: DuraPrep (Iodine Povacrylex [0.7% available iodine] and Isopropyl Alcohol, 74% w/w) Prep Area: Entire Posterior Lumbar Region from lower scapular tip down to mid buttocks area and from flank to flank. Materials:  Tray: Epidural tray Needle(s):  Type: Epidural needle (Tuohy) Gauge (G):  17 Length: Regular (3.5-in) Qty: 1   Pre-op H&P Assessment:  Rachael West is a 67 y.o. (year old), female patient, seen today for interventional treatment. She  has a past surgical history that includes Appendectomy; Trigger finger release; Esophagogastroduodenoscopy (01/17/2011); Colonoscopy (01/17/2011); Abdominal hysterectomy; and Breast biopsy (Left, 2015). Rachael West has a current medication list which includes the following prescription(s): amlodipine, atenolol, b complex vitamins, buspirone, dimenhydrinate, gabapentin, hydroxyzine, ipratropium-albuterol, culturelle, latanoprost, lisinopril, lubiprostone, meloxicam, montelukast, naloxone, omeprazole, ondansetron, paliperidone, pantoprazole, polyethylene glycol, prazosin, sertraline, sucralfate, tramadol, trazodone, ventolin hfa, benefiber, wixela inhub, and magnesium oxide -mg supplement. Her primarily concern today is the Back Pain (Lumbar left is worse at night and during the day right is worse ) and Wrist Pain (Hand and wrist pain on the right,  CTS )  Initial Vital Signs:  Pulse/HCG Rate: 83  Temp: (!) 97.5 F (36.4 C) Resp: 16 BP: (!) 152/81 SpO2: 100 %  BMI: Estimated body mass index is 29.79 kg/m as calculated from the following:   Height as of this encounter: 5\' 5"  (1.651 m).   Weight as of this encounter: 179 lb (81.2 kg).  Risk Assessment: Allergies: Reviewed. She is allergic to latex, penicillins, codeine, cortisone, and sulfa antibiotics.  Allergy Precautions: None required Coagulopathies: Reviewed. None identified.  Blood-thinner therapy: None at this time Active Infection(s): Reviewed.  None  identified. Rachael West is afebrile  Site Confirmation: Rachael West was asked to confirm the procedure and laterality before marking the site Procedure checklist: Completed Consent: Before the procedure and under the influence of no sedative(s), amnesic(s), or anxiolytics, the patient was informed of the treatment options, risks and possible complications. To fulfill our ethical and legal obligations, as recommended by the American Medical Association's Code of Ethics, I have informed the patient of my clinical impression; the nature and purpose of the treatment or procedure; the risks, benefits, and possible complications of the intervention; the alternatives, including doing nothing; the risk(s) and benefit(s) of the alternative treatment(s) or procedure(s); and the risk(s) and benefit(s) of doing nothing. The patient was provided information about the general risks and possible complications associated with the procedure. These may include, but are not limited to: failure to achieve desired goals, infection, bleeding, organ or nerve damage, allergic reactions, paralysis, and death. In addition, the patient was informed of those risks and complications associated to Spine-related procedures, such as failure to decrease pain; infection (i.e.: Meningitis, epidural or intraspinal abscess); bleeding (i.e.: epidural hematoma, subarachnoid hemorrhage, or any other type of intraspinal or peri-dural bleeding); organ or nerve damage (i.e.: Any type of peripheral nerve, nerve root, or spinal cord injury) with subsequent damage to sensory, motor, and/or autonomic systems, resulting in permanent pain, numbness, and/or weakness of one or several areas of the body; allergic reactions; (i.e.: anaphylactic reaction); and/or death. Furthermore, the patient was informed of those risks and complications associated with the medications. These include, but are not limited to: allergic reactions (i.e.: anaphylactic  or anaphylactoid reaction(s)); adrenal axis suppression; blood sugar elevation that in diabetics may result in ketoacidosis or comma; water retention that in patients with history of congestive heart failure may result in shortness of breath, pulmonary edema, and decompensation with resultant heart failure; weight gain; swelling or edema; medication-induced neural toxicity; particulate matter embolism and blood vessel occlusion with resultant organ, and/or nervous system infarction; and/or aseptic necrosis of one or more joints. Finally, the patient was informed that Medicine is not an exact science; therefore, there is also the possibility of unforeseen or unpredictable risks and/or possible complications that may result in a catastrophic outcome. The patient indicated having understood very clearly. We have given the patient no guarantees and we have made no promises. Enough time was given to the patient to ask questions, all of which were answered to the patient's satisfaction. Ms. Tomkiewicz has indicated that she wanted to continue with the procedure. Attestation: I, the ordering provider, attest that I have discussed with the patient the benefits, risks, side-effects, alternatives, likelihood of achieving goals, and potential problems during recovery for the procedure that I have provided informed consent. Date  Time: 06/15/2022 10:14 AM   Pre-Procedure Preparation:  Monitoring: As per clinic protocol. Respiration, ETCO2, SpO2, BP, heart rate and rhythm monitor placed and checked for adequate function Safety Precautions: Patient was assessed for positional comfort and pressure points before starting the procedure. Time-out: I initiated and conducted the "Time-out" before starting the procedure, as per protocol. The patient was asked to participate by confirming the accuracy of the "Time Out" information. Verification of the correct person, site, and procedure were performed and confirmed by me, the  nursing staff, and the patient. "Time-out" conducted as per Joint Commission's Universal Protocol (UP.01.01.01). Time: 1133 Start Time: 1133 hrs.  Description/Narrative of Procedure:          Target: Epidural space via interlaminar opening, initially  targeting the lower laminar border of the superior vertebral body. Region: Lumbar Approach: Percutaneous paravertebral  Rationale (medical necessity): procedure needed and proper for the diagnosis and/or treatment of the patient's medical symptoms and needs. Procedural Technique Safety Precautions: Aspiration looking for blood return was conducted prior to all injections. At no point did we inject any substances, as a needle was being advanced. No attempts were made at seeking any paresthesias. Safe injection practices and needle disposal techniques used. Medications properly checked for expiration dates. SDV (single dose vial) medications used. Description of the Procedure: Protocol guidelines were followed. The procedure needle was introduced through the skin, ipsilateral to the reported pain, and advanced to the target area. Bone was contacted and the needle walked caudad, until the lamina was cleared. The epidural space was identified using "loss-of-resistance technique" with 2-3 ml of PF-NaCl (0.9% NSS), in a 5cc LOR glass syringe.  Vitals:   06/15/22 1005 06/15/22 1129 06/15/22 1134 06/15/22 1139  BP: (!) 152/81 (!) 164/84 (!) 160/82 (!) 158/80  Pulse: 83 88 91 89  Resp: 16 16 15 16   Temp: (!) 97.5 F (36.4 C)     TempSrc: Temporal     SpO2: 100% 100% 100% 100%  Weight: 179 lb (81.2 kg)     Height: 5\' 5"  (1.651 m)       Start Time: 1133 hrs. End Time:   hrs.  Imaging Guidance (Spinal):          Type of Imaging Technique: Fluoroscopy Guidance (Spinal) Indication(s): Assistance in needle guidance and placement for procedures requiring needle placement in or near specific anatomical locations not easily accessible without such  assistance. Exposure Time: Please see nurses notes. Contrast: Before injecting any contrast, we confirmed that the patient did not have an allergy to iodine, shellfish, or radiological contrast. Once satisfactory needle placement was completed at the desired level, radiological contrast was injected. Contrast injected under live fluoroscopy. No contrast complications. See chart for type and volume of contrast used. Fluoroscopic Guidance: I was personally present during the use of fluoroscopy. "Tunnel Vision Technique" used to obtain the best possible view of the target area. Parallax error corrected before commencing the procedure. "Direction-depth-direction" technique used to introduce the needle under continuous pulsed fluoroscopy. Once target was reached, antero-posterior, oblique, and lateral fluoroscopic projection used confirm needle placement in all planes. Images permanently stored in EMR. Interpretation: I personally interpreted the imaging intraoperatively. Adequate needle placement confirmed in multiple planes. Appropriate spread of contrast into desired area was observed. No evidence of afferent or efferent intravascular uptake. No intrathecal or subarachnoid spread observed. Permanent images saved into the patient's record.  Antibiotic Prophylaxis:   Anti-infectives (From admission, onward)    None      Indication(s): None identified  Post-operative Assessment:  Post-procedure Vital Signs:  Pulse/HCG Rate: 89  Temp: (!) 97.5 F (36.4 C) Resp: 16 BP: (!) 158/80 SpO2: 100 %  EBL: None  Complications: No immediate post-treatment complications observed by team, or reported by patient.  Note: The patient tolerated the entire procedure well. A repeat set of vitals were taken after the procedure and the patient was kept under observation following institutional policy, for this type of procedure. Post-procedural neurological assessment was performed, showing return to baseline,  prior to discharge. The patient was provided with post-procedure discharge instructions, including a section on how to identify potential problems. Should any problems arise concerning this procedure, the patient was given instructions to immediately contact us, at any time, without hesitation. In  any case, we plan to contact the patient by telephone for a follow-up status report regarding this interventional procedure.  Comments:  No additional relevant information.  Plan of Care (POC)  Orders:  Orders Placed This Encounter  Procedures   Lumbar Epidural Injection    Scheduling Instructions:     Procedure: Interlaminar LESI L4-5     Laterality: Left     Sedation: Patient's choice     Timeframe: Today    Order Specific Question:   Where will this procedure be performed?    Answer:   [facility]   DG [facility] C-ARM 1-60 MIN NO REPORT    Intraoperative interpretation by procedural physician at [facility].    Standing Status:   Standing    Number of Occurrences:   1    Order Specific Question:   Reason for exam:    Answer:   Assistance in needle guidance and placement for procedures requiring needle placement in or near specific anatomical locations not easily accessible without such assistance.   Informed Consent Details: Physician/Practitioner Attestation; Transcribe to consent form and obtain patient signature    Note: Always confirm laterality of pain with Ms. Gaver, before procedure. Transcribe to consent form and obtain patient signature.    Order Specific Question:   Physician/Practitioner attestation of informed consent for procedure/surgical case    Answer:   I, the physician/practitioner, attest that I have discussed with the patient the benefits, risks, side effects, alternatives, likelihood of achieving goals and potential problems during recovery for the procedure that I have provided informed consent.    Order Specific Question:   Procedure     Answer:   Lumbar epidural steroid injection under fluoroscopic guidance    Order Specific Question:   Physician/Practitioner performing the procedure    Answer:   Matheson Vandehei A. Laban Emperor, MD    Order Specific Question:   Indication/Reason    Answer:   Low back and/or lower extremity pain secondary to lumbar radiculitis   Provide equipment / supplies at bedside    Procedural tray: Epidural Tray (Disposable  single use) Skin infiltration needle: Regular 1.5-in, 25-G, (x1) Block needle size: Regular standard Catheter: No catheter required    Standing Status:   Standing    Number of Occurrences:   1    Order Specific Question:   Specify    Answer:   Epidural Tray   Follow-up    Schedule [name] for a post-procedure follow-up evaluation encounter 2 weeks from now.    Standing Status:   Future    Standing Expiration Date:   06/29/2022    Scheduling Instructions:     Schedule follow-up visit on afternoon of procedure day (T, Th)     Type: Face-to-face (F2F) Post-procedure (PP) evaluation (E/M)     When: 2 weeks from now   Latex precautions    Activate Latex-Free Protocol.    Standing Status:   Standing    Number of Occurrences:   1   Chronic Opioid Analgesic:  Tramadol 50 mg, 1-2 tabs (50-100 mg) PO q 6 hrs (400 mg/day of tramadol) MME/day: 40 mg/day.   Medications ordered for procedure: Meds ordered this encounter  Medications   iohexol (OMNIPAQUE) 180 MG/ML injection 10 mL    Must be Myelogram-compatible. If not available, you may substitute with a water-soluble, non-ionic, hypoallergenic, myelogram-compatible radiological contrast medium.   lidocaine (XYLOCAINE) 2 % (with pres) injection 400 mg   pentafluoroprop-tetrafluoroeth (GEBAUERS) aerosol   lactated ringers  infusion   midazolam (VERSED) injection 0.5-2 mg    Make sure Flumazenil is available in the pyxis when using this medication. If oversedation occurs, administer 0.2 mg IV over 15 sec. If after 45 sec no response,  administer 0.2 mg again over 1 min; may repeat at 1 min intervals; not to exceed 4 doses (1 mg)   triamcinolone acetonide (KENALOG-40) injection 40 mg   sodium chloride flush (NS) 0.9 % injection 2 mL   ropivacaine (PF) 2 mg/mL (0.2%) (NAROPIN) injection 2 mL   Medications administered: We administered iohexol, lidocaine, pentafluoroprop-tetrafluoroeth, lactated ringers, midazolam, triamcinolone acetonide, sodium chloride flush, and ropivacaine (PF) 2 mg/mL (0.2%).  See the medical record for exact dosing, route, and time of administration.  Follow-up plan:   Return in about 2 weeks (around 06/29/2022) for Proc-day (T,Th), (Face2F), (PPE).       Interventional Therapies  Risk Factors  Complex Considerations:   Allergy: LATEX  Hx Hepatitis C (w/ cirrhosis)   Planned  Pending:      Under consideration:   Diagnostic/therapeutic left cervical ESI #2  Therapeutic left caudal ESI #2  Therapeutic right cervical ESI #3  Therapeutic right Racz procedure #1  Diagnostic right suprascapular NB #1  Therapeutic bilateral lumbar facet RFA #1 (starting on left side)  Therapeutic bilateral cervical facet RFA #1 (starting on left side)    Completed:   Therapeutic right glenohumeral/AC  inj. x4 (12/08/2021) (100/100/100/100)  Therapeutic bilateral lumbar facet MBB x3 (06/03/2019) (100/100/90/90)  Diagnostic bilateral cervical facet MBB x1 (11/06/2017) (100/100/90)  Diagnostic left caudal ESI x1 (08/14/2019) (100/100/100 x8 days/90-100)  Therapeutic right cervical ESI x3 (04/12/2021) (100/100/80-95/80-95)  Therapeutic left cervical ESI x1 (07/19/2021) (100/100/100/100) (helped neck, shoulders, headache, and arms)    Therapeutic  Palliative (PRN) options:   Therapeutic/palliative glenohumeral/AC  inj.   Therapeutic/palliative lumbar facet MBB   Therapeutic/palliative cervical facet MBB   Therapeutic/palliative caudal ESI   Therapeutic/palliative cervical ESI    Pharmacotherapy:  Nonopioids  transfer 10/27/2019: Benefiber, Amitiza, magnesium, Mobic, and Neurontin        Recent Visits Date Type Provider Dept  05/08/22 Office Visit Delano Metz, MD [facility]  Showing recent visits within past 90 days and meeting all other requirements Today's Visits Date Type Provider Dept  06/15/22 Procedure visit Delano Metz, MD [facility]  Showing today's visits and meeting all other requirements Future Appointments Date Type Provider Dept  07/04/22 Appointment Delano Metz, MD [facility]  07/19/22 Appointment Delano Metz, MD [facility]  Showing future appointments within next 90 days and meeting all other requirements  Disposition: Discharge home  Discharge (Date  Time): 06/15/2022; 1145 hrs.   Primary Care Physician: Melida Quitter, PA Location: [facility] Note by: Oswaldo Done, MD (TTS technology used. I apologize for any typographical errors that were not detected and corrected.) Date: 06/15/2022; Time: 12:10 PM  Disclaimer:  Medicine is not an Visual merchandiser. The only guarantee in medicine is that nothing is guaranteed. It is important to note that the decision to proceed with this intervention was based on the information collected from the patient. The Data and conclusions were drawn from the patient's questionnaire, the interview, and the physical examination. Because the information was provided in large part by the patient, it cannot be guaranteed that it has not been purposely or unconsciously manipulated. Every effort has been made to obtain as much relevant data as possible for this evaluation. It is important to note  that the conclusions that lead to this procedure are derived in large part from the available data. Always take into account that the treatment will also be dependent on availability of resources and existing treatment guidelines, considered by other Pain  Management Practitioners as being common knowledge and practice, at the time of the intervention. For Medico-Legal purposes, it is also important to point out that variation in procedural techniques and pharmacological choices are the acceptable norm. The indications, contraindications, technique, and results of the above procedure should only be interpreted and judged by a Board-Certified Interventional Pain Specialist with extensive familiarity and expertise in the same exact procedure and technique.

## 2022-06-16 ENCOUNTER — Telehealth: Payer: Self-pay | Admitting: *Deleted

## 2022-06-16 NOTE — Telephone Encounter (Signed)
No problems post procedure. 

## 2022-06-25 ENCOUNTER — Other Ambulatory Visit: Payer: Self-pay | Admitting: Family Medicine

## 2022-06-30 ENCOUNTER — Ambulatory Visit
Admission: RE | Admit: 2022-06-30 | Discharge: 2022-06-30 | Disposition: A | Discharge status: Home / Self Care | Payer: 59 | Source: Ambulatory Visit | Attending: Family Medicine | Admitting: Family Medicine

## 2022-06-30 DIAGNOSIS — Z1231 Encounter for screening mammogram for malignant neoplasm of breast: Secondary | ICD-10-CM

## 2022-07-03 NOTE — Progress Notes (Signed)
PROVIDER NOTE: Information contained herein reflects review and annotations entered in association with encounter. Interpretation of such information and data should be left to medically-trained personnel. Information provided to patient can be located elsewhere in the medical record under "Patient Instructions". Document created using STT-dictation technology, any transcriptional errors that may result from process are unintentional.    Patient: Rachael West  Service Category: E/M  Provider: Oswaldo Done, MD  DOB: Jan 22, 1955  DOS: 07/06/2022  Referring Provider: Melida Quitter, PA  MRN: [id number]  Specialty: Interventional Pain Management  PCP: Melida Quitter, PA  Type: Established Patient  Setting: Ambulatory outpatient    Location: Office  Delivery: Face-to-face     HPI  [name], a 67 y.o. year old female, is here today because of her No primary diagnosis found.. [name] primary complain today is No chief complaint on file.  Pertinent problems: Ms. Bally has Large B-cell lymphoma (HCC); Chronic low back pain (1ry area of Pain) (Bilateral) w/ sciatica (Left); Chronic lower extremity pain (2ry area of Pain) (Left); Chronic upper back pain (3ry area of Pain) (Bilateral) (L>R); Chronic neck pain (4th area of Pain) (Bilateral) (L>R); Occipital headache; Chronic knee pain (Bilateral) (R>L); Chronic pain syndrome; Chronic sacroiliac joint pain; DDD (degenerative disc disease), lumbar; Lumbar facet arthropathy; Lumbar facet syndrome (Bilateral) (L>R); Spondylosis without myelopathy or radiculopathy, lumbar region; DDD (degenerative disc disease), thoracic; Thoracolumbar dextroscoliosis; Cervicogenic headache; Tendinitis of rotator cuff (Left); Tendinitis of rotator cuff (Right); Fibromyalgia; Chronic shoulder pain (Bilateral) (R>L); Chronic hip pain (Bilateral) (L>R); Cervicalgia; Chronic idiopathic gout; DDD (degenerative disc disease), cervical;  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); Cervical facet syndrome (Bilateral) (R>L); Spondylosis without myelopathy or radiculopathy, cervical region; Chronic musculoskeletal pain; Chronic low back pain (1ry area of Pain) (Bilateral) (L>R) w/o sciatica; Cervical disc disorder at C5-C6 level with radiculopathy; Cervical radiculopathy at C6; Osteoarthrosis of multiple sites; Radicular pain of upper extremity (Right); Chronic upper extremity pain (Right); Chronic shoulder pain (Right); Arthralgia of shoulder region (Right); Shoulder enthesopathy (Right); Musculoskeletal disorder involving upper trapezius muscle (Right); Chronic neuropathic pain; Disturbance of skin sensation; Hyperalgesia; Trigger finger, middle finger (Right); Trigger point of shoulder region (Right); Carpal tunnel syndrome (Bilateral); Chronic shoulder pain (Left); Osteoarthritis of glenohumeral joint (Right); Primary osteoarthritis of shoulder (Right); Rotator cuff arthropathy of shoulder (Right); Tendinopathy of rotator cuff (Right); Lumbosacral sensory radiculopathy at L5 (Left); and Abnormal MRI, lumbar spine (05/17/2022) on their pertinent problem list. Pain Assessment: Severity of   is reported as a  /10. Location:    / . Onset:  . Quality:  . Timing:  . Modifying factor(s):  Marland Kitchen Vitals:  vitals were not taken for this visit.  BMI: Estimated body mass index is 29.79 kg/m as calculated from the following:   Height as of 06/15/22: 5\' 5"  (1.651 m).   Weight as of 06/15/22: 179 lb (81.2 kg). Last encounter: 05/08/2022. Last procedure: 06/15/2022.  Reason for encounter: post-procedure evaluation and assessment. ***  Post-procedure evaluation   Procedure: Lumbar epidural steroid injection (LESI) (interlaminar) #1    Laterality: Left   Level:  L4-5 Level.  Imaging: Fluoroscopic guidance         Anesthesia: Local anesthesia (1-2% Lidocaine) Anxiolysis: IV Versed 1.0 mg Sedation: No Sedation                       DOS: 06/15/2022   Performed by: Oswaldo Done, MD  Purpose: Diagnostic/Therapeutic Indications: Lumbar radicular pain of  intraspinal etiology of more than 4 weeks that has failed to respond to conservative therapy and is severe enough to impact quality of life or function. 1. 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 Chronic lower extremity pain (2ry area of Pain) (Left)   3. DDD (degenerative disc disease), lumbar   4. Lumbosacral sensory radiculopathy at L5 (Left)   5. Abnormal MRI, lumbar spine (05/17/2022)   6. Latex precautions, history of latex allergy   7. Encounter for therapeutic procedure    NAS-11 Pain score:   Pre-procedure: 6 /10   Post-procedure: 6 /10      Effectiveness:  Initial hour after procedure:   ***. Subsequent 4-6 hours post-procedure:   ***. Analgesia past initial 6 hours:   ***. Ongoing improvement:  Analgesic:  *** Function:    ***    ROM:    ***     Pharmacotherapy Assessment  Analgesic: Tramadol 50 mg, 1-2 tabs (50-100 mg) PO q 6 hrs (400 mg/day of tramadol) MME/day: 40 mg/day.   Monitoring: [place] PMP: PDMP reviewed during this encounter.       Pharmacotherapy: No side-effects or adverse reactions reported. Compliance: No problems identified. Effectiveness: Clinically acceptable.  No notes on file  No results found for: "CBDTHCR" No results found for: "D8THCCBX" No results found for: "D9THCCBX"  UDS:  Summary  Date Value Ref Range Status  06/27/2021 Note  Final    Comment:    ==================================================================== [employer] Select 13 (MW) ==================================================================== Test                             Result       Flag       Units  Drug Present and Declared for Prescription Verification   Tramadol                       >5435        EXPECTED   ng/mg creat   O-Desmethyltramadol            >5435        EXPECTED   ng/mg creat   N-Desmethyltramadol             >5435        EXPECTED   ng/mg creat    Source of tramadol is a prescription medication. O-desmethyltramadol    and N-desmethyltramadol are expected metabolites of tramadol.  ==================================================================== Test                      Result    Flag   Units      Ref Range   Creatinine              92               mg/dL      >=62 ==================================================================== Declared Medications:  The flagging and interpretation on this report are based on the  following declared medications.  Unexpected results may arise from  inaccuracies in the declared medications.   **Note: The testing scope of this panel includes these medications:   Tramadol (Ultram)   **Note: The testing scope of this panel does not include the  following reported medications:   Albuterol (Ventolin HFA)  Albuterol (Duoneb)  Amlodipine (Norvasc)  Atenolol (Tenormin)  Buspirone (Buspar)  Dimenhydrinate  Fluticasone (Advair)  Gabapentin (Neurontin)  Hydroxyzine (Atarax)  Ipratropium (Duoneb)  Latanoprost (Xalatan)  Lisinopril (Zestril)  Loratadine (Claritin)  Lubiprostone (Amitiza)  Magnesium (Mag-Ox)  Meloxicam (Mobic)  Montelukast (Singulair)  Omeprazole (Prilosec)  Ondansetron (Zofran)  Paliperidone (Invega)  Polyethylene Glycol (MiraLAX)  Prazosin (Minipress)  Probiotic  Salmeterol (Advair)  Sertraline (Zoloft)  Supplement  Trazodone (Desyrel)  Vitamin B ==================================================================== For clinical consultation, please call [phone]. ====================================================================       ROS  Constitutional: Denies any fever or chills Gastrointestinal: No reported hemesis, hematochezia, vomiting, or acute GI distress Musculoskeletal: Denies any acute onset joint swelling, redness, loss of ROM, or weakness Neurological: No reported episodes of acute onset  apraxia, aphasia, dysarthria, agnosia, amnesia, paralysis, loss of coordination, or loss of consciousness  Medication Review  Benefiber, Culturelle, Magnesium Oxide -Mg Supplement, albuterol, amLODipine, atenolol, b complex vitamins, busPIRone, dimenhyDRINATE, fluticasone-salmeterol, gabapentin, hydrOXYzine, ipratropium-albuterol, latanoprost, lisinopril, lubiprostone, meloxicam, montelukast, naloxone, omeprazole, ondansetron, paliperidone, pantoprazole, polyethylene glycol, prazosin, sertraline, sucralfate, traMADol, and traZODone  History Review  Allergy: Ms. Sookdeo is allergic to latex, penicillins, codeine, cortisone, and sulfa antibiotics. Drug: Ms. Reppucci  reports no history of drug use. Alcohol:  reports no history of alcohol use. Tobacco:  reports that she quit smoking about 32 years ago. Her smoking use included cigarettes. She has a 30.00 pack-year smoking history. She has been exposed to tobacco smoke. She has never used smokeless tobacco. Social: Ms. Sallade  reports that she quit smoking about 32 years ago. Her smoking use included cigarettes. She has a 30.00 pack-year smoking history. She has been exposed to tobacco smoke. She has never used smokeless tobacco. She reports that she does not drink alcohol and does not use drugs. Medical:  has a past medical history of Anemia, Anxiety, Arthritis, Asthma, Depression, GERD (gastroesophageal reflux disease), Hepatitis-C, History of chemotherapy, Hypertension, Insomnia, Lymphoma (HCC) (12/2013), Personal history of non-Hodgkin lymphomas, and Vitamin D deficiency. Surgical: Ms. Elizondo  has a past surgical history that includes Appendectomy; Trigger finger release; Esophagogastroduodenoscopy (01/17/2011); Colonoscopy (01/17/2011); Abdominal hysterectomy; and Breast biopsy (Left, 2015). Family: family history includes Allergies in her mother; Asthma in her mother and sister; Breast cancer in her cousin; Colon cancer in her  paternal aunt; Diabetes in her maternal grandfather and maternal grandmother; Heart disease in her brother, father, paternal grandfather, and paternal grandmother; Liver cancer in her paternal uncle; Ovarian cancer in an other family member; Pancreatic cancer in an other family member.  Laboratory Chemistry Profile   Renal Lab Results  Component Value Date   BUN 12 03/06/2022   CREATININE 0.82 03/06/2022   BCR 15 03/06/2022   GFRAA >60 08/05/2019   GFRNONAA >60 08/12/2020    Hepatic Lab Results  Component Value Date   AST 15 03/06/2022   ALT 9 03/06/2022   ALBUMIN 4.4 03/06/2022   ALKPHOS 109 03/06/2022   HCVAB REACTIVE (A) 01/12/2014   LIPASE 34 10/31/2010    Electrolytes Lab Results  Component Value Date   NA 139 03/06/2022   K 4.7 03/06/2022   CL 101 03/06/2022   CALCIUM 9.4 03/06/2022   MG 2.2 08/29/2017    Bone Lab Results  Component Value Date   VD25OH 16.7 (L) 03/06/2022   25OHVITD1 21 (L) 08/29/2017   25OHVITD2 <1.0 08/29/2017   25OHVITD3 21 08/29/2017    Inflammation (CRP: Acute Phase) (ESR: Chronic Phase) Lab Results  Component Value Date   CRP 12 (H) 08/29/2017   ESRSEDRATE 46 (H) 08/29/2017         Note: Above Lab results reviewed.  Recent Imaging Review  DG  PAIN CLINIC C-ARM 1-60 MIN NO REPORT Fluoro was used, but no Radiologist interpretation will be provided.  Please refer to "NOTES" tab for provider progress note. Note: Reviewed        Physical Exam  General appearance: Well nourished, well developed, and well hydrated. In no apparent acute distress Mental status: Alert, oriented x 3 (person, place, & time)       Respiratory: No evidence of acute respiratory distress Eyes: PERLA Vitals: There were no vitals taken for this visit. BMI: Estimated body mass index is 29.79 kg/m as calculated from the following:   Height as of 06/15/22: 5\' 5"  (1.651 m).   Weight as of 06/15/22: 179 lb (81.2 kg). Ideal: Patient weight not recorded  Assessment    Diagnosis Status  No diagnosis found. Controlled Controlled Controlled   Updated Problems: No problems updated.  Plan of Care  Problem-specific:  No problem-specific Assessment & Plan notes found for this encounter.  Ms. KEYUNDRA FANT has a current medication list which includes the following long-term medication(s): amlodipine, atenolol, gabapentin, ipratropium-albuterol, lisinopril, lubiprostone, magnesium oxide -mg supplement, meloxicam, montelukast, omeprazole, paliperidone, prazosin, sertraline, sucralfate, tramadol, trazodone, ventolin hfa, and benefiber.  Pharmacotherapy (Medications Ordered): No orders of the defined types were placed in this encounter.  Orders:  No orders of the defined types were placed in this encounter.  Follow-up plan:   No follow-ups on file.      Interventional Therapies  Risk Factors  Complex Considerations:   Allergy: LATEX  Hx Hepatitis C (w/ cirrhosis)   Planned  Pending:      Under consideration:   Diagnostic/therapeutic left cervical ESI #2  Therapeutic left caudal ESI #2  Therapeutic right cervical ESI #3  Therapeutic right Racz procedure #1  Diagnostic right suprascapular NB #1  Therapeutic bilateral lumbar facet RFA #1 (starting on left side)  Therapeutic bilateral cervical facet RFA #1 (starting on left side)    Completed:   Therapeutic right glenohumeral/AC  inj. x4 (12/08/2021) (100/100/100/100)  Therapeutic bilateral lumbar facet MBB x3 (06/03/2019) (100/100/90/90)  Diagnostic bilateral cervical facet MBB x1 (11/06/2017) (100/100/90)  Diagnostic left caudal ESI x1 (08/14/2019) (100/100/100 x8 days/90-100)  Therapeutic right cervical ESI x3 (04/12/2021) (100/100/80-95/80-95)  Therapeutic left cervical ESI x1 (07/19/2021) (100/100/100/100) (helped neck, shoulders, headache, and arms)    Therapeutic  Palliative (PRN) options:   Therapeutic/palliative glenohumeral/AC  inj.   Therapeutic/palliative lumbar facet MBB    Therapeutic/palliative cervical facet MBB   Therapeutic/palliative caudal ESI   Therapeutic/palliative cervical ESI    Pharmacotherapy:  Nonopioids transfer 10/27/2019: [employer] [facility], Amitiza, magnesium, Mobic, and Neurontin         Recent Visits Date Type Provider Dept  06/15/22 Procedure visit Delano Metz, MD [facility]  05/08/22 Office Visit Delano Metz, MD [facility]  Showing recent visits within past 90 days and meeting all other requirements Future Appointments Date Type Provider Dept  07/06/22 Appointment Delano Metz, MD [facility]  07/19/22 Appointment Delano Metz, MD [facility]  Showing future appointments within next 90 days and meeting all other requirements  I discussed the assessment and treatment plan with the patient. The patient was provided an opportunity to ask questions and all were answered. The patient agreed with the plan and demonstrated an understanding of the instructions.  Patient advised to call back or seek an in-person evaluation if the symptoms or condition worsens.  Duration of encounter: *** minutes.  Total time on encounter, as per AMA guidelines included both the face-to-face  and non-face-to-face time personally spent by the physician and/or other qualified health care professional(s) on the day of the encounter (includes time in activities that require the physician or other qualified health care professional and does not include time in activities normally performed by clinical staff). Physician's time may include the following activities when performed: Preparing to see the patient (e.g., pre-charting review of records, searching for previously ordered imaging, lab work, and nerve conduction tests) Review of prior analgesic pharmacotherapies. Reviewing PMP Interpreting ordered tests (e.g., lab work, imaging, nerve conduction tests) Performing post-procedure evaluations,  including interpretation of diagnostic procedures Obtaining and/or reviewing separately obtained history Performing a medically appropriate examination and/or evaluation Counseling and educating the patient/family/caregiver Ordering medications, tests, or procedures Referring and communicating with other health care professionals (when not separately reported) Documenting clinical information in the electronic or other health record Independently interpreting results (not separately reported) and communicating results to the patient/ family/caregiver Care coordination (not separately reported)  Note by: Oswaldo Done, MD Date: 07/06/2022; Time: 6:15 AM

## 2022-07-04 ENCOUNTER — Telehealth: Payer: Self-pay

## 2022-07-04 ENCOUNTER — Ambulatory Visit: Payer: 59 | Admitting: Pain Medicine

## 2022-07-04 DIAGNOSIS — H401121 Primary open-angle glaucoma, left eye, mild stage: Secondary | ICD-10-CM | POA: Diagnosis not present

## 2022-07-04 DIAGNOSIS — H2513 Age-related nuclear cataract, bilateral: Secondary | ICD-10-CM | POA: Diagnosis not present

## 2022-07-04 NOTE — Telephone Encounter (Signed)
Unsuccessful attempt to reach patient on preferred number listed in notes for scheduled AWV. Left message on voicemail okay to reschedule. 

## 2022-07-05 ENCOUNTER — Ambulatory Visit (INDEPENDENT_AMBULATORY_CARE_PROVIDER_SITE_OTHER): Payer: 59 | Admitting: Family Medicine

## 2022-07-05 ENCOUNTER — Encounter: Payer: Self-pay | Admitting: Family Medicine

## 2022-07-05 VITALS — BP 163/88 | HR 77 | Resp 18 | Ht 65.0 in | Wt 191.0 lb

## 2022-07-05 DIAGNOSIS — Z23 Encounter for immunization: Secondary | ICD-10-CM

## 2022-07-05 DIAGNOSIS — M797 Fibromyalgia: Secondary | ICD-10-CM

## 2022-07-05 DIAGNOSIS — F419 Anxiety disorder, unspecified: Secondary | ICD-10-CM | POA: Diagnosis not present

## 2022-07-05 DIAGNOSIS — E785 Hyperlipidemia, unspecified: Secondary | ICD-10-CM | POA: Diagnosis not present

## 2022-07-05 DIAGNOSIS — J4520 Mild intermittent asthma, uncomplicated: Secondary | ICD-10-CM

## 2022-07-05 DIAGNOSIS — F3340 Major depressive disorder, recurrent, in remission, unspecified: Secondary | ICD-10-CM

## 2022-07-05 DIAGNOSIS — I10 Essential (primary) hypertension: Secondary | ICD-10-CM | POA: Diagnosis not present

## 2022-07-05 MED ORDER — IPRATROPIUM-ALBUTEROL 0.5-2.5 (3) MG/3ML IN SOLN
RESPIRATORY_TRACT | 2 refills | Status: DC
Start: 2022-07-05 — End: 2023-06-11

## 2022-07-05 MED ORDER — SHINGRIX 50 MCG/0.5ML IM SUSR
0.5000 mL | Freq: Once | INTRAMUSCULAR | 0 refills | Status: AC
Start: 2022-07-05 — End: 2022-07-05

## 2022-07-05 MED ORDER — TETANUS-DIPHTH-ACELL PERTUSSIS 5-2.5-18.5 LF-MCG/0.5 IM SUSY
0.5000 mL | PREFILLED_SYRINGE | Freq: Once | INTRAMUSCULAR | 0 refills | Status: AC
Start: 2022-07-05 — End: 2022-07-05

## 2022-07-05 MED ORDER — PNEUMOCOCCAL 20-VAL CONJ VACC 0.5 ML IM SUSY
0.5000 mL | PREFILLED_SYRINGE | Freq: Once | INTRAMUSCULAR | 0 refills | Status: AC
Start: 2022-07-05 — End: 2022-07-05

## 2022-07-05 MED ORDER — MONTELUKAST SODIUM 10 MG PO TABS
10.0000 mg | ORAL_TABLET | Freq: Every day | ORAL | 1 refills | Status: DC
Start: 2022-07-05 — End: 2023-02-14

## 2022-07-05 MED ORDER — PALIPERIDONE ER 6 MG PO TB24
6.0000 mg | ORAL_TABLET | Freq: Every morning | ORAL | 1 refills | Status: DC
Start: 2022-07-05 — End: 2023-02-12

## 2022-07-05 MED ORDER — BUSPIRONE HCL 10 MG PO TABS
10.0000 mg | ORAL_TABLET | Freq: Three times a day (TID) | ORAL | 1 refills | Status: DC
Start: 2022-07-05 — End: 2023-03-12

## 2022-07-05 MED ORDER — GABAPENTIN 100 MG PO CAPS
200.0000 mg | ORAL_CAPSULE | Freq: Every day | ORAL | 1 refills | Status: DC
Start: 2022-07-05 — End: 2023-03-12

## 2022-07-05 NOTE — Assessment & Plan Note (Signed)
Stable, continue montelukast 10 mg daily for maintenance.  Will continue to monitor.

## 2022-07-05 NOTE — Progress Notes (Signed)
Established Patient Office Visit  Subjective   Patient ID: Rachael West, female    DOB: 20-Jun-1955  Age: 67 y.o. MRN: [id number]  Chief Complaint  Patient presents with   Anxiety   Depression   Hypertension    HPI Rachael West is a 68 y.o. female presenting today for follow up of hypertension, mood. Hypertension:  Pt denies chest pain, SOB, dizziness, edema, syncope, fatigue or heart palpitations. Taking lisinopril and atenolol, reports excellent compliance with treatment. Denies side effects. She has not taken her medicines today. Mood: Patient is here to follow up for anxiety and depression, currently managing with Zoloft, BuSpar, hydroxyzine, paliperidone, trazodone. Taking medication without side effects, reports excellent compliance with treatment. Denies mood changes or SI/HI. She feels mood is improved since last visit.  She states that she feels happier than usual right now.  She continues to work with her psychiatrist at [facility].     07/05/2022    9:34 AM 04/25/2022   10:11 AM 03/06/2022    9:14 AM  Depression screen PHQ 2/9  Decreased Interest 2 2 2   Down, Depressed, Hopeless 2 3 3   PHQ - 2 Score 4 5 5   Altered sleeping 3 3 3   Tired, decreased energy 2 3 2   Change in appetite 2 3 2   Feeling bad or failure about yourself  1 1 2   Trouble concentrating 1 2 3   Moving slowly or fidgety/restless 1 3 2   Suicidal thoughts 0 1 1  PHQ-9 Score 14 21 20   Difficult doing work/chores Somewhat difficult Very difficult        07/05/2022    9:34 AM 04/25/2022   10:11 AM 03/06/2022    9:15 AM 11/02/2021    1:34 PM  GAD 7 : Generalized Anxiety Score  Nervous, Anxious, on Edge 3 3 2 1   Control/stop worrying 2 3 3 1   Worry too much - different things 3 2 2 3   Trouble relaxing 3 3 3 1   Restless 2 1 2 3   Easily annoyed or irritable 1 1 1 1   Afraid - awful might happen 2 3 3 3   Total GAD 7 Score 16 16 16 13   Anxiety Difficulty Somewhat difficult Very difficult  Very  difficult   ROS Negative unless otherwise noted in HPI   Objective:     BP (!) 163/88 (BP Location: Left Arm, Patient Position: Sitting, Cuff Size: Normal)   Pulse 77   Resp 18   Ht 5\' 5"  (1.651 m)   Wt 191 lb (86.6 kg)   SpO2 100%   BMI 31.78 kg/m   Physical Exam Constitutional:      General: She is not in acute distress.    Appearance: Normal appearance.  HENT:     Head: Normocephalic and atraumatic.  Cardiovascular:     Rate and Rhythm: Normal rate and regular rhythm.     Heart sounds: No murmur heard.    No friction rub. No gallop.  Pulmonary:     Effort: Pulmonary effort is normal. No respiratory distress.     Breath sounds: No wheezing, rhonchi or rales.  Skin:    General: Skin is warm and dry.  Neurological:     Mental Status: She is alert and oriented to person, place, and time.      Assessment & Plan:  Essential hypertension Assessment & Plan: BP goal <130/80.  Blood pressure above goal, patient has not taken medications this morning.  Return in 2 weeks for blood  pressure nurse visit after taking medications.  If at goal, continue lisinopril 5 mg daily, atenolol 25 mg daily.  Orders: -     CBC with Differential/Platelet; Future -     Comprehensive metabolic panel; Future  Anxiety Assessment & Plan: GAD-7 score of 16, near patient's baseline.  She is happy with her current medication routine.  Continue paliperidone 6 mg daily, buspirone 10 mg 3 times daily, Zoloft 200 mg daily, trazodone 50 mg as needed at bedtime, hydroxyzine 50 mg as needed.  Continue following with [facility] psychiatry as well.  Orders: -     busPIRone HCl; Take 1 tablet (10 mg total) by mouth 3 (three) times daily.  Dispense: 270 tablet; Refill: 1 -     Paliperidone ER; Take 1 tablet (6 mg total) by mouth every morning.  Dispense: 90 tablet; Refill: 1  Recurrent major depressive disorder, in remission [facility]) Assessment & Plan: PHQ-9 score 14, near baseline. Continue paliperidone 6 mg  daily, buspirone 10 mg 3 times daily, Zoloft 200 mg daily, trazodone 50 mg as needed at bedtime, hydroxyzine 50 mg as needed. Continue following with [facility] psychiatry.   Hyperlipidemia, unspecified hyperlipidemia type Assessment & Plan: Repeating lipid panel today.  Continue heart healthy diet low in fat.  Will continue to monitor and adjust management as indicated by lab results.  Orders: -     CBC with Differential/Platelet; Future -     Comprehensive metabolic panel; Future -     Lipid panel; Future  Fibromyalgia Assessment & Plan: Continue gabapentin 200 mg daily at bedtime.  Will continue to monitor.  Orders: -     Gabapentin; Take 2 capsules (200 mg total) by mouth at bedtime. Follow written titration schedule.  Dispense: 180 capsule; Refill: 1  Intermittent asthma, unspecified asthma severity, unspecified whether complicated Assessment & Plan: Stable, continue montelukast 10 mg daily for maintenance.  Will continue to monitor.  Orders: -     Ipratropium-Albuterol; INHALE 3 MLS VIA NEBULIZER AS DIRECTED  Dispense: 270 mL; Refill: 2 -     Montelukast Sodium; Take 1 tablet (10 mg total) by mouth daily.  Dispense: 90 tablet; Refill: 1  Need for pneumococcal 20-valent conjugate vaccination -     Pneumococcal 20-Val Conj Vacc; Inject 0.5 mLs into the muscle once for 1 dose.  Dispense: 0.5 mL; Refill: 0  Need for shingles vaccine -     Shingrix; Inject 0.5 mLs into the muscle once for 1 dose. Administer at 0 and 2-6 months for 2 total doses.  To be administered by pharmacy.  Dispense: 0.5 mL; Refill: 0  Need for diphtheria-tetanus-pertussis (Tdap) vaccine -     Tetanus-Diphth-Acell Pertussis; Inject 0.5 mLs into the muscle once for 1 dose.  Dispense: 0.5 mL; Refill: 0  Discussed need for pneumonia, shingles, tetanus vaccines.  Patient is agreeable, she is aware that she will need to go to her local pharmacy per Medicare requirements.  Return in about 4 months (around  11/04/2022) for follow-up for HTN, HLD, mood, asthma, fasting blood work 1 week before.    Melida Quitter, PA

## 2022-07-05 NOTE — Assessment & Plan Note (Signed)
Continue gabapentin 200 mg daily at bedtime.  Will continue to monitor.

## 2022-07-05 NOTE — Assessment & Plan Note (Signed)
GAD-7 score of 16, near patient's baseline.  She is happy with her current medication routine.  Continue paliperidone 6 mg daily, buspirone 10 mg 3 times daily, Zoloft 200 mg daily, trazodone 50 mg as needed at bedtime, hydroxyzine 50 mg as needed.  Continue following with [facility] psychiatry as well.

## 2022-07-05 NOTE — Assessment & Plan Note (Signed)
BP goal <130/80.  Blood pressure above goal, patient has not taken medications this morning.  Return in 2 weeks for blood pressure nurse visit after taking medications.  If at goal, continue lisinopril 5 mg daily, atenolol 25 mg daily.

## 2022-07-05 NOTE — Assessment & Plan Note (Signed)
Repeating lipid panel today.  Continue heart healthy diet low in fat.  Will continue to monitor and adjust management as indicated by lab results.

## 2022-07-05 NOTE — Patient Instructions (Addendum)
Take your medicines before your nurse visit blood pressure check!!  AT THE PHARMACY: -Due for PCV20 pneumonia vaccine -Due for Tdap tetanus booster -Due for Shingrix shingles vaccine

## 2022-07-05 NOTE — Assessment & Plan Note (Signed)
PHQ-9 score 14, near baseline. Continue paliperidone 6 mg daily, buspirone 10 mg 3 times daily, Zoloft 200 mg daily, trazodone 50 mg as needed at bedtime, hydroxyzine 50 mg as needed. Continue following with [facility] psychiatry.

## 2022-07-06 ENCOUNTER — Ambulatory Visit: Payer: 59 | Attending: Pain Medicine | Admitting: Pain Medicine

## 2022-07-06 ENCOUNTER — Encounter: Payer: Self-pay | Admitting: Pain Medicine

## 2022-07-06 VITALS — BP 141/76 | HR 79 | Temp 97.3°F | Resp 16 | Ht 65.0 in | Wt 193.0 lb

## 2022-07-06 DIAGNOSIS — M5417 Radiculopathy, lumbosacral region: Secondary | ICD-10-CM

## 2022-07-06 DIAGNOSIS — M5442 Lumbago with sciatica, left side: Secondary | ICD-10-CM | POA: Diagnosis not present

## 2022-07-06 DIAGNOSIS — G8929 Other chronic pain: Secondary | ICD-10-CM

## 2022-07-06 DIAGNOSIS — Z09 Encounter for follow-up examination after completed treatment for conditions other than malignant neoplasm: Secondary | ICD-10-CM | POA: Diagnosis not present

## 2022-07-06 DIAGNOSIS — M79605 Pain in left leg: Secondary | ICD-10-CM | POA: Insufficient documentation

## 2022-07-06 LAB — CBC WITH DIFFERENTIAL/PLATELET
Basophils Absolute: 0 10*3/uL (ref 0.0–0.2)
Basos: 1 %
EOS (ABSOLUTE): 0.1 10*3/uL (ref 0.0–0.4)
Eos: 2 %
Hematocrit: 36.7 % (ref 34.0–46.6)
Hemoglobin: 11.4 g/dL (ref 11.1–15.9)
Immature Grans (Abs): 0 10*3/uL (ref 0.0–0.1)
Immature Granulocytes: 0 %
Lymphocytes Absolute: 2.3 10*3/uL (ref 0.7–3.1)
Lymphs: 42 %
MCH: 24.7 pg — ABNORMAL LOW (ref 26.6–33.0)
MCHC: 31.1 g/dL — ABNORMAL LOW (ref 31.5–35.7)
MCV: 79 fL (ref 79–97)
Monocytes Absolute: 0.4 10*3/uL (ref 0.1–0.9)
Monocytes: 8 %
Neutrophils Absolute: 2.6 10*3/uL (ref 1.4–7.0)
Neutrophils: 47 %
Platelets: 229 10*3/uL (ref 150–450)
RBC: 4.62 x10E6/uL (ref 3.77–5.28)
RDW: 14.3 % (ref 11.7–15.4)
WBC: 5.5 10*3/uL (ref 3.4–10.8)

## 2022-07-06 LAB — COMPREHENSIVE METABOLIC PANEL
ALT: 12 IU/L (ref 0–32)
AST: 20 IU/L (ref 0–40)
Albumin: 3.9 g/dL (ref 3.9–4.9)
Alkaline Phosphatase: 102 IU/L (ref 44–121)
BUN/Creatinine Ratio: 9 — ABNORMAL LOW (ref 12–28)
BUN: 8 mg/dL (ref 8–27)
Bilirubin Total: 0.3 mg/dL (ref 0.0–1.2)
CO2: 25 mmol/L (ref 20–29)
Calcium: 9.2 mg/dL (ref 8.7–10.3)
Chloride: 102 mmol/L (ref 96–106)
Creatinine, Ser: 0.89 mg/dL (ref 0.57–1.00)
Globulin, Total: 3 g/dL (ref 1.5–4.5)
Glucose: 79 mg/dL (ref 70–99)
Potassium: 4.7 mmol/L (ref 3.5–5.2)
Sodium: 140 mmol/L (ref 134–144)
Total Protein: 6.9 g/dL (ref 6.0–8.5)
eGFR: 71 mL/min/{1.73_m2} (ref >59)

## 2022-07-06 LAB — LIPID PANEL
Chol/HDL Ratio: 3.3 ratio (ref 0.0–4.4)
Cholesterol, Total: 181 mg/dL (ref 100–199)
HDL: 55 mg/dL (ref >39)
LDL Chol Calc (NIH): 107 mg/dL — ABNORMAL HIGH (ref 0–99)
Triglycerides: 104 mg/dL (ref 0–149)
VLDL Cholesterol Cal: 19 mg/dL (ref 5–40)

## 2022-07-06 NOTE — Progress Notes (Signed)
Safety precautions to be maintained throughout the outpatient stay will include: orient to surroundings, keep bed in low position, maintain call bell within reach at all times, provide assistance with transfer out of bed and ambulation.  

## 2022-07-10 NOTE — Progress Notes (Signed)
Patient notified of results.

## 2022-07-11 ENCOUNTER — Ambulatory Visit (INDEPENDENT_AMBULATORY_CARE_PROVIDER_SITE_OTHER): Payer: 59

## 2022-07-11 VITALS — Ht 65.0 in | Wt 193.0 lb

## 2022-07-11 DIAGNOSIS — Z0000 Encounter for general adult medical examination without abnormal findings: Secondary | ICD-10-CM | POA: Diagnosis not present

## 2022-07-11 NOTE — Progress Notes (Signed)
Subjective:   Rachael West is a 67 y.o. female who presents for Medicare Annual (Subsequent) preventive examination.  Visit Complete: In person  Patient Medicare AWV questionnaire was completed by the patient on 07/10/22; I have confirmed that all information answered by patient is correct and no changes since this date.  Review of Systems      Cardiac Risk Factors include: advanced age (>64men, >51 women);hypertension     Objective:    Today's Vitals   07/11/22 1409 07/11/22 1410  Weight: 193 lb (87.5 kg)   Height: 5\' 5"  (1.651 m)   PainSc:  3    Body mass index is 32.12 kg/m.     07/11/2022    2:26 PM 06/15/2022   10:10 AM 12/08/2021   10:57 AM 07/19/2021    9:04 AM 03/17/2021    2:18 PM 05/04/2020    8:17 AM 04/15/2020    8:58 AM  Advanced Directives  Does Patient Have a Medical Advance Directive? No No No No No No No  Would patient like information on creating a medical advance directive? No - Patient declined  No - Patient declined        Current Medications (verified) Outpatient Encounter Medications as of 07/11/2022  Medication Sig   atenolol (TENORMIN) 25 MG tablet Take 1 tablet (25 mg total) by mouth daily.   b complex vitamins tablet Take 1 tablet by mouth daily.   busPIRone (BUSPAR) 10 MG tablet Take 1 tablet (10 mg total) by mouth 3 (three) times daily.   dimenhyDRINATE (DRAMAMINE) 50 MG tablet Take 50 mg by mouth every 8 (eight) hours as needed.   gabapentin (NEURONTIN) 100 MG capsule Take 2 capsules (200 mg total) by mouth at bedtime. Follow written titration schedule.   hydrOXYzine (ATARAX) 50 MG tablet TAKE 1 TABLET BY MOUTH EVERYDAY AT BEDTIME   ipratropium-albuterol (DUONEB) 0.5-2.5 (3) MG/3ML SOLN INHALE 3 MLS VIA NEBULIZER AS DIRECTED   Lactobacillus Rhamnosus, GG, (CULTURELLE) CAPS Take 1 capsule by mouth daily.   latanoprost (XALATAN) 0.005 % ophthalmic solution INSTILL 1 DROP INTO BOTH EYES AT BEDTIME   lisinopril (ZESTRIL) 5 MG tablet Take 1  tablet (5 mg total) by mouth daily.   lubiprostone (AMITIZA) 24 MCG capsule Take 1 capsule (24 mcg total) by mouth 2 (two) times daily with a meal.   Magnesium Oxide -Mg Supplement 500 MG CAPS Take 1 capsule (500 mg total) by mouth in the morning and at bedtime.   meloxicam (MOBIC) 15 MG tablet Take 1 tablet (15 mg total) by mouth daily.   montelukast (SINGULAIR) 10 MG tablet Take 1 tablet (10 mg total) by mouth daily.   naloxone (NARCAN) nasal spray 4 mg/0.1 mL Place 1 spray into the nose as needed for up to 365 doses (for opioid-induced respiratory depresssion). In case of emergency (overdose), spray once into each nostril. If no response within 3 minutes, repeat application and call 911.   omeprazole (PRILOSEC) 40 MG capsule 1 po qd   ondansetron (ZOFRAN) 4 MG tablet Take 1 tablet (4 mg total) by mouth 2 (two) times daily.   paliperidone (INVEGA) 6 MG 24 hr tablet Take 1 tablet (6 mg total) by mouth every morning.   polyethylene glycol (MIRALAX / GLYCOLAX) packet Take 17 g by mouth 2 (two) times daily.   prazosin (MINIPRESS) 1 MG capsule Take 2 mg by mouth at bedtime.   sertraline (ZOLOFT) 100 MG tablet Take 200 mg by mouth every morning.   sucralfate (CARAFATE) 1  g tablet Take 1 tablet (1 g total) by mouth 2 (two) times daily.   traMADol (ULTRAM) 50 MG tablet Take 1 tablet (50 mg total) by mouth every 6 (six) hours as needed for severe pain. Each refill must last 30 days.   traZODone (DESYREL) 50 MG tablet 1 TABLET BY MOUTH AT BEDTIME   VENTOLIN HFA 108 (90 Base) MCG/ACT inhaler INHALE 2 PUFFS BY MOUTH INTO THE LUNGS 4 TIMES DAILY   Wheat Dextrin (BENEFIBER) POWD Take 6 g by mouth 3 (three) times daily before meals. (2 tsp = 6 g)   WIXELA INHUB 250-50 MCG/ACT AEPB SMARTSIG:1 Puff(s) Via Inhaler Morning-Night   No facility-administered encounter medications on file as of 07/11/2022.    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   Personal history of non-Hodgkin lymphomas    Vitamin D deficiency    Past Surgical History:  Procedure Laterality Date   ABDOMINAL HYSTERECTOMY     APPENDECTOMY     BREAST BIOPSY Left 2015   lymphoma   COLONOSCOPY  01/17/2011   Procedure: COLONOSCOPY;  Surgeon: Vertell Novak., MD;  Location: [facility];  Service: Endoscopy;  Laterality: N/A;   ESOPHAGOGASTRODUODENOSCOPY  01/17/2011   Procedure: ESOPHAGOGASTRODUODENOSCOPY (EGD);  Surgeon: Vertell Novak., MD;  Location: [facility];  Service: Endoscopy;  Laterality: N/A;   TRIGGER FINGER RELEASE     Family History  Problem Relation Age of Onset   Asthma Mother    Allergies Mother    Asthma Sister    Heart disease Father    Heart disease Brother    Diabetes Maternal Grandmother    Diabetes Maternal Grandfather    Heart disease Paternal Grandmother    Heart disease Paternal Grandfather    Colon cancer Paternal Aunt    Liver cancer Paternal Uncle    Breast cancer Cousin    Ovarian cancer Other        great aunt (m)   Pancreatic cancer Other        great aunt (m)   Stomach cancer Neg Hx    Rectal cancer Neg Hx    Esophageal cancer Neg Hx    Social History   Socioeconomic History   Marital status: Single    Spouse name: Not on file   Number of children: Not on file   Years of education: Not on file   Highest education level: Not on file  Occupational History   Occupation: Disabled  Tobacco Use   Smoking status: Former    Packs/day: 1.50    Years: 20.00    Additional pack years: 0.00    Total pack years: 30.00    Types: Cigarettes    Quit date: 01/09/1990    Years since quitting: 32.5    Passive exposure: Past   Smokeless tobacco: Never  Vaping Use    Vaping Use: Never used  Substance and Sexual Activity   Alcohol use: No    Alcohol/week: 0.0 standard drinks of alcohol   Drug use: No   Sexual activity: Not Currently  Other Topics Concern   Not on file  Social History  Narrative   Not on file   Social Determinants of Health   Financial Resource Strain: Low Risk  (07/11/2022)   Overall Financial Resource Strain (CARDIA)    Difficulty of Paying Living Expenses: Not hard at all  Food Insecurity: No Food Insecurity (07/11/2022)   Hunger Vital Sign    Worried About Running Out of Food in the Last Year: Never true    Ran Out of Food in the Last Year: Never true  Transportation Needs: No Transportation Needs (07/11/2022)   PRAPARE - Administrator, Civil Service (Medical): No    Lack of Transportation (Non-Medical): No  Physical Activity: Insufficiently Active (07/11/2022)   Exercise Vital Sign    Days of Exercise per Week: 3 days    Minutes of Exercise per Session: 30 min  Stress: No Stress Concern Present (07/11/2022)   [employer] of Occupational Health - Occupational Stress Questionnaire    Feeling of Stress : Not at all  Social Connections: Moderately Integrated (07/11/2022)   Social Connection and Isolation Panel [NHANES]    Frequency of Communication with Friends and Family: More than three times a week    Frequency of Social Gatherings with Friends and Family: More than three times a week    Attends Religious Services: More than 4 times per year    Active Member of [employer] or Organizations: Yes    Attends Engineer, structural: More than 4 times per year    Marital Status: Divorced    Tobacco Counseling Counseling given: Not Answered   Clinical Intake:  Pre-visit preparation completed: Yes  Pain : 0-10 Pain Score: 3  Pain Type: Chronic pain Pain Location: Back Pain Orientation: Lower Pain Radiating Towards: Lower back to left  leg Pain Descriptors / Indicators: Shooting Pain Onset: More than a  month ago Pain Frequency: Intermittent Pain Relieving Factors: Rx Medications Effect of Pain on Daily Activities: Effects activities  Pain Relieving Factors: Rx Medications  BMI - recorded: 32.12 Nutritional Status: BMI > 30  Obese Nutritional Risks: None Diabetes: No  How often do you need to have someone help you when you read instructions, pamphlets, or other written materials from your doctor or pharmacy?: 1 - Never  Interpreter Needed?: No  Information entered by :: Theresa Mulligan  LPN   Activities of Daily Living    07/11/2022    2:23 PM 07/11/2022    2:20 PM  In your present state of health, do you have any difficulty performing the following activities:  Hearing? 1 1  Comment  Wears hearing aids  Vision?  0  Difficulty concentrating or making decisions? 1 0  Walking or climbing stairs? 1 1  Comment  Uses a cane. Followed by Orthopedic  Dressing or bathing? 0 0  Doing errands, shopping? 0 0  Preparing Food and eating ?  N  Using the Toilet?  N  In the past six months, have you accidently leaked urine?  Y  Comment  Wears breifs. Followed by Urologist  Do you have problems with loss of bowel control?  N  Managing your Medications?  N  Managing your Finances?  N  Housekeeping or managing your Housekeeping?  N    Patient Care Team: Melida Quitter, PA as PCP - General (Family Medicine) Carman Ching, MD (Inactive) as Consulting Physician (Gastroenterology) Myrtie Neither Andreas Blower, MD as Consulting Physician (Gastroenterology) Salvatore Marvel, MD as Consulting Physician (Orthopedic Surgery) [name], MD as Consulting Physician (Neurosurgery) Delano Metz, MD as  Consulting Physician (Pain Medicine)  Indicate any recent Medical Services you may have received from other than [facility] providers in the past year (date may be approximate).     Assessment:   This is a routine wellness examination for [place].  Hearing/Vision screen Hearing Screening -  Comments:: Wears hearing aids Vision Screening - Comments:: Wears rx glasses - up to date with routine eye exams with  [facility]  Dietary issues and exercise activities discussed:     Goals Addressed               This Visit's Progress     Lose weight (pt-stated)         Depression Screen    07/11/2022    2:18 PM 07/06/2022    1:59 PM 07/05/2022    9:34 AM 04/25/2022   10:11 AM 03/06/2022    9:14 AM 12/08/2021   10:57 AM 11/02/2021    1:35 PM  PHQ 2/9 Scores  PHQ - 2 Score 0 0 4 5 5  0 2  PHQ- 9 Score 0  14 21 20  14     Fall Risk    07/11/2022    2:23 PM 07/10/2022    3:00 PM 07/06/2022    1:59 PM 06/15/2022   10:09 AM 05/08/2022    9:48 AM  Fall Risk   Falls in the past year? 1 1 0 0 0  Number falls in past yr: 0 1  0   Injury with Fall? 1 1     Comment Skin tear to left hand and left knee. Self care no medical attention      Risk for fall due to : Impaired balance/gait   No Fall Risks   Follow up Falls prevention discussed   Falls evaluation completed     MEDICARE RISK AT HOME:  Medicare Risk at Home - 07/11/22 1431     Any stairs in or around the home? No    If so, are there any without handrails? No    Home free of loose throw rugs in walkways, pet beds, electrical cords, etc? Yes    Adequate lighting in your home to reduce risk of falls? Yes    Life alert? Yes    Use of a cane, walker or w/c? No    Grab bars in the bathroom? No    Shower chair or bench in shower? Yes    Elevated toilet seat or a handicapped toilet? No             TIMED UP AND GO:  Was the test performed?  No    Cognitive Function:        07/11/2022    2:26 PM 11/02/2021    1:27 PM 03/25/2020   12:03 PM 05/06/2018   10:17 AM  6CIT Screen  What Year? 0 points 0 points 0 points 0 points  What month? 0 points 0 points 0 points 0 points  What time? 0 points 0 points 0 points 0 points  Count back from 20 0 points 0 points 0 points 4 points  Months in reverse 0 points 0 points  0 points 0 points  Repeat phrase 0 points 0 points  6 points  Total Score 0 points 0 points  10 points    Immunizations Immunization History  Administered Date(s) Administered   COVID-19, mRNA, vaccine(Comirnaty)12 years and older 10/20/2021   Fluad Quad(high Dose 65+) 10/20/2021   Influenza, High Dose Seasonal PF 11/24/2018, 11/17/2019  Influenza,inj,Quad PF,6+ Mos 11/06/2014   Influenza-Unspecified 10/09/2013   Pneumococcal Polysaccharide-23 03/09/2015    TDAP status: Due, Education has been provided regarding the importance of this vaccine. Advised may receive this vaccine at local pharmacy or Health Dept. Aware to provide a copy of the vaccination record if obtained from local pharmacy or Health Dept. Verbalized acceptance and understanding.  Flu Vaccine status: Up to date    Covid-19 vaccine status: Completed vaccines  Qualifies for Shingles Vaccine? Yes   Zostavax completed No   Shingrix Completed?: No.    Education has been provided regarding the importance of this vaccine. Patient has been advised to call insurance company to determine out of pocket expense if they have not yet received this vaccine. Advised may also receive vaccine at local pharmacy or Health Dept. Verbalized acceptance and understanding.  Screening Tests Health Maintenance  Topic Date Due   DTaP/Tdap/Td (1 - Tdap) Never done   DEXA SCAN  Never done   COVID-19 Vaccine (2 - Pfizer risk series) 11/10/2021   Zoster Vaccines- Shingrix (1 of 2) 07/25/2022 (Originally 08/10/1974)   Pneumonia Vaccine 12+ Years old (2 of 2 - PCV) 04/25/2023 (Originally 08/11/2018)   INFLUENZA VACCINE  08/10/2022   Medicare Annual Wellness (AWV)  07/11/2023   MAMMOGRAM  06/29/2024   Colonoscopy  06/16/2031   Hepatitis C Screening  Completed   HPV VACCINES  Aged Out    Health Maintenance  Health Maintenance Due  Topic Date Due   DTaP/Tdap/Td (1 - Tdap) Never done   DEXA SCAN  Never done   COVID-19 Vaccine (2 - Pfizer  risk series) 11/10/2021    Colorectal cancer screening: Type of screening: Colonoscopy. Completed 06/15/21. Repeat every 10 years  Mammogram status: Completed 06/30/19. Repeat every year  Bone Density status: Ordered Scheduled for. Pt provided with contact info and advised to call to schedule appt.  Lung Cancer Screening: (Low Dose CT Chest recommended if Age 15-80 years, 20 pack-year currently smoking OR have quit w/in 15years.) does not qualify.     Additional Screening:  Hepatitis C Screening: does qualify; Completed 02/25/21  Vision Screening: Recommended annual ophthalmology exams for early detection of glaucoma and other disorders of the eye. Is the patient up to date with their annual eye exam?  Yes  Who is the provider or what is the name of the office in which the patient attends annual eye exams? [facility] If pt is not established with a provider, would they like to be referred to a provider to establish care? No .   Dental Screening: Recommended annual dental exams for proper oral hygiene    Community Resource Referral / Chronic Care Management:  CRR required this visit?  No   CCM required this visit?  No     Plan:     I have personally reviewed and noted the following in the patient's chart:   Medical and social history Use of alcohol, tobacco or illicit drugs  Current medications and supplements including opioid prescriptions. Patient is not currently taking opioid prescriptions. Functional ability and status Nutritional status Physical activity Advanced directives List of other physicians Hospitalizations, surgeries, and ER visits in previous 12 months Vitals Screenings to include cognitive, depression, and falls Referrals and appointments  In addition, I have reviewed and discussed with patient certain preventive protocols, quality metrics, and best practice recommendations. A written personalized care plan for preventive services as well as  general preventive health recommendations were provided to patient.  Tillie Rung, LPN   01/14/1094   After Visit Summary: (MyChart) Due to this being a telephonic visit, the after visit summary with patients personalized plan was offered to patient via MyChart   Nurse Notes:  None

## 2022-07-11 NOTE — Patient Instructions (Addendum)
Rachael West , Thank you for taking time to come for your Medicare Wellness Visit. I appreciate your ongoing commitment to your health goals. Please review the following plan we discussed and let me know if I can assist you in the future.   These are the goals we discussed:  Goals       Lose weight (pt-stated)        This is a list of the screening recommended for you and due dates:  Health Maintenance  Topic Date Due   DTaP/Tdap/Td vaccine (1 - Tdap) Never done   DEXA scan (bone density measurement)  Never done   COVID-19 Vaccine (2 - Pfizer risk series) 11/10/2021   Zoster (Shingles) Vaccine (1 of 2) 07/25/2022*   Pneumonia Vaccine (2 of 2 - PCV) 04/25/2023*   Flu Shot  08/10/2022   Medicare Annual Wellness Visit  07/11/2023   Mammogram  06/29/2024   Colon Cancer Screening  06/16/2031   Hepatitis C Screening  Completed   HPV Vaccine  Aged Out  *Topic was postponed. The date shown is not the original due date.    Advanced directives: Advance directive discussed with you today. Even though you declined this today, please call our office should you change your mind, and we can give you the proper paperwork for you to fill out.   Conditions/risks identified: None  Next appointment: Follow up in one year for your annual wellness visit     Preventive Care 65 Years and Older, Female Preventive care refers to lifestyle choices and visits with your health care provider that can promote health and wellness. What does preventive care include? A yearly physical exam. This is also called an annual well check. Dental exams once or twice a year. Routine eye exams. Ask your health care provider how often you should have your eyes checked. Personal lifestyle choices, including: Daily care of your teeth and gums. Regular physical activity. Eating a healthy diet. Avoiding tobacco and drug use. Limiting alcohol use. Practicing safe sex. Taking low-dose aspirin every day. Taking  vitamin and mineral supplements as recommended by your health care provider. What happens during an annual well check? The services and screenings done by your health care provider during your annual well check will depend on your age, overall health, lifestyle risk factors, and family history of disease. Counseling  Your health care provider may ask you questions about your: Alcohol use. Tobacco use. Drug use. Emotional well-being. Home and relationship well-being. Sexual activity. Eating habits. History of falls. Memory and ability to understand (cognition). Work and work Astronomer. Reproductive health. Screening  You may have the following tests or measurements: Height, weight, and BMI. Blood pressure. Lipid and cholesterol levels. These may be checked every 5 years, or more frequently if you are over 82 years old. Skin check. Lung cancer screening. You may have this screening every year starting at age 56 if you have a 30-pack-year history of smoking and currently smoke or have quit within the past 15 years. Fecal occult blood test (FOBT) of the stool. You may have this test every year starting at age 62. Flexible sigmoidoscopy or colonoscopy. You may have a sigmoidoscopy every 5 years or a colonoscopy every 10 years starting at age 3. Hepatitis C blood test. Hepatitis B blood test. Sexually transmitted disease (STD) testing. Diabetes screening. This is done by checking your blood sugar (glucose) after you have not eaten for a while (fasting). You may have this done every 1-3 years. Bone density  scan. This is done to screen for osteoporosis. You may have this done starting at age 1. Mammogram. This may be done every 1-2 years. Talk to your health care provider about how often you should have regular mammograms. Talk with your health care provider about your test results, treatment options, and if necessary, the need for more tests. Vaccines  Your health care provider may  recommend certain vaccines, such as: Influenza vaccine. This is recommended every year. Tetanus, diphtheria, and acellular pertussis (Tdap, Td) vaccine. You may need a Td booster every 10 years. Zoster vaccine. You may need this after age 65. Pneumococcal 13-valent conjugate (PCV13) vaccine. One dose is recommended after age 70. Pneumococcal polysaccharide (PPSV23) vaccine. One dose is recommended after age 36. Talk to your health care provider about which screenings and vaccines you need and how often you need them. This information is not intended to replace advice given to you by your health care provider. Make sure you discuss any questions you have with your health care provider. Document Released: 01/22/2015 Document Revised: 09/15/2015 Document Reviewed: 10/27/2014 Elsevier Interactive Patient Education  2017 [employer].  Fall Prevention in the Home Falls can cause injuries. They can happen to people of all ages. There are many things you can do to make your home safe and to help prevent falls. What can I do on the outside of my home? Regularly fix the edges of walkways and driveways and fix any cracks. Remove anything that might make you trip as you walk through a door, such as a raised step or threshold. Trim any bushes or trees on the path to your home. Use bright outdoor lighting. Clear any walking paths of anything that might make someone trip, such as rocks or tools. Regularly check to see if handrails are loose or broken. Make sure that both sides of any steps have handrails. Any raised decks and porches should have guardrails on the edges. Have any leaves, snow, or ice cleared regularly. Use sand or salt on walking paths during winter. Clean up any spills in your garage right away. This includes oil or grease spills. What can I do in the bathroom? Use night lights. Install grab bars by the toilet and in the tub and shower. Do not use towel bars as grab bars. Use non-skid  mats or decals in the tub or shower. If you need to sit down in the shower, use a plastic, non-slip stool. Keep the floor dry. Clean up any water that spills on the floor as soon as it happens. Remove soap buildup in the tub or shower regularly. Attach bath mats securely with double-sided non-slip rug tape. Do not have throw rugs and other things on the floor that can make you trip. What can I do in the bedroom? Use night lights. Make sure that you have a light by your bed that is easy to reach. Do not use any sheets or blankets that are too big for your bed. They should not hang down onto the floor. Have a firm chair that has side arms. You can use this for support while you get dressed. Do not have throw rugs and other things on the floor that can make you trip. What can I do in the kitchen? Clean up any spills right away. Avoid walking on wet floors. Keep items that you use a lot in easy-to-reach places. If you need to reach something above you, use a strong step stool that has a grab bar. Keep electrical  cords out of the way. Do not use floor polish or wax that makes floors slippery. If you must use wax, use non-skid floor wax. Do not have throw rugs and other things on the floor that can make you trip. What can I do with my stairs? Do not leave any items on the stairs. Make sure that there are handrails on both sides of the stairs and use them. Fix handrails that are broken or loose. Make sure that handrails are as long as the stairways. Check any carpeting to make sure that it is firmly attached to the stairs. Fix any carpet that is loose or worn. Avoid having throw rugs at the top or bottom of the stairs. If you do have throw rugs, attach them to the floor with carpet tape. Make sure that you have a light switch at the top of the stairs and the bottom of the stairs. If you do not have them, ask someone to add them for you. What else can I do to help prevent falls? Wear shoes  that: Do not have high heels. Have rubber bottoms. Are comfortable and fit you well. Are closed at the toe. Do not wear sandals. If you use a stepladder: Make sure that it is fully opened. Do not climb a closed stepladder. Make sure that both sides of the stepladder are locked into place. Ask someone to hold it for you, if possible. Clearly mark and make sure that you can see: Any grab bars or handrails. First and last steps. Where the edge of each step is. Use tools that help you move around (mobility aids) if they are needed. These include: Canes. Walkers. Scooters. Crutches. Turn on the lights when you go into a dark area. Replace any light bulbs as soon as they burn out. Set up your furniture so you have a clear path. Avoid moving your furniture around. If any of your floors are uneven, fix them. If there are any pets around you, be aware of where they are. Review your medicines with your doctor. Some medicines can make you feel dizzy. This can increase your chance of falling. Ask your doctor what other things that you can do to help prevent falls. This information is not intended to replace advice given to you by your health care provider. Make sure you discuss any questions you have with your health care provider. Document Released: 10/22/2008 Document Revised: 06/03/2015 Document Reviewed: 01/30/2014 Elsevier Interactive Patient Education  2017 [employer].

## 2022-07-17 ENCOUNTER — Telehealth: Payer: Self-pay | Admitting: *Deleted

## 2022-07-17 NOTE — Telephone Encounter (Signed)
I do need her to come into the office so that we can verify that the medicine is working well on our machines.  She should also bring her blood pressure machine to ensure that it is getting accurate readings at home.  If it is accurate, then we can possibly consider checking it at home in the future if necessary, but for this time she will need to come in person in the next couple weeks.

## 2022-07-17 NOTE — Telephone Encounter (Signed)
Pt informed of below.  

## 2022-07-17 NOTE — Telephone Encounter (Signed)
Pt calling to cancel nurse visit due to another appointment.  She is wanting to know if she can check it at home and call with the numbers. Please advise.

## 2022-07-18 NOTE — Progress Notes (Unsigned)
PROVIDER NOTE: Information contained herein reflects review and annotations entered in association with encounter. Interpretation of such information and data should be left to medically-trained personnel. Information provided to patient can be located elsewhere in the medical record under "Patient Instructions". Document created using STT-dictation technology, any transcriptional errors that may result from process are unintentional.    Patient: Rachael West  Service Category: E/M  Provider: Oswaldo Done, MD  DOB: 1955/07/08  DOS: 07/19/2022  Referring Provider: Peggye West  MRN: [id number]  Specialty: Interventional Pain Management  PCP: Rachael Quitter, PA  Type: Established Patient  Setting: Ambulatory outpatient    Location: Office  Delivery: Face-to-face     HPI  Rachael West, a 67 y.o. year old female, is here today because of her Chronic pain syndrome [G89.4]. [name] primary complain today is No chief complaint on file.  Pertinent problems: [name] has Large B-cell lymphoma (HCC); Chronic low back pain (1ry area of Pain) (Bilateral) w/ sciatica (Left); Chronic lower extremity pain (2ry area of Pain) (Left); Chronic upper back pain (3ry area of Pain) (Bilateral) (L>R); Chronic neck pain (4th area of Pain) (Bilateral) (L>R); Occipital headache; Chronic knee pain (Bilateral) (R>L); Chronic pain syndrome; Chronic sacroiliac joint pain; DDD (degenerative disc disease), lumbar; Lumbar facet arthropathy; Lumbar facet syndrome (Bilateral) (L>R); Spondylosis without myelopathy or radiculopathy, lumbar region; DDD (degenerative disc disease), thoracic; Thoracolumbar dextroscoliosis; Cervicogenic headache; Tendinitis of rotator cuff (Left); Tendinitis of rotator cuff (Right); Fibromyalgia; Chronic shoulder pain (Bilateral) (R>L); Chronic hip pain (Bilateral) (L>R); Cervicalgia; Chronic idiopathic gout; DDD (degenerative disc disease), cervical;  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); Cervical facet syndrome (Bilateral) (R>L); Spondylosis without myelopathy or radiculopathy, cervical region; Chronic musculoskeletal pain; Chronic low back pain (1ry area of Pain) (Bilateral) (L>R) w/o sciatica; Cervical disc disorder at C5-C6 level with radiculopathy; Cervical radiculopathy at C6; Osteoarthrosis of multiple sites; Radicular pain of upper extremity (Right); Chronic upper extremity pain (Right); Chronic shoulder pain (Right); Arthralgia of shoulder region (Right); Shoulder enthesopathy (Right); Musculoskeletal disorder involving upper trapezius muscle (Right); Chronic neuropathic pain; Disturbance of skin sensation; Hyperalgesia; Trigger finger, middle finger (Right); Trigger point of shoulder region (Right); Carpal tunnel syndrome (Bilateral); Chronic shoulder pain (Left); Osteoarthritis of glenohumeral joint (Right); Primary osteoarthritis of shoulder (Right); Rotator cuff arthropathy of shoulder (Right); Tendinopathy of rotator cuff (Right); Lumbosacral sensory radiculopathy at L5 (Left); and Abnormal MRI, lumbar spine (05/17/2022) on their pertinent problem list. Pain Assessment: Severity of   is reported as a  /10. Location:    / . Onset:  . Quality:  . Timing:  . Modifying factor(s):  Marland Kitchen Vitals:  vitals were not taken for this visit.  BMI: Estimated body mass index is 32.12 kg/m as calculated from the following:   Height as of 07/11/22: 5\' 5"  (1.651 m).   Weight as of 07/11/22: 193 lb (87.5 kg). Last encounter: 07/06/2022. Last procedure: 06/15/2022.  Reason for encounter: medication management. ***  Routine UDS ordered today.   RTCB: 01/15/2023   Pharmacotherapy Assessment  Analgesic: Tramadol 50 mg, 1-2 tabs (50-100 mg) PO q 6 hrs (400 mg/day of tramadol) MME/day: 40 mg/day.   Monitoring: [place] PMP: PDMP reviewed during this encounter.       Pharmacotherapy: No side-effects or adverse reactions reported. Compliance: No problems  identified. Effectiveness: Clinically acceptable.  No notes on file  No results found for: "CBDTHCR" No results found for: "D8THCCBX" No results found for: "D9THCCBX"  UDS:  Summary  Date Value  Ref Range Status  06/27/2021 Note  Final    Comment:    ==================================================================== [employer] [facility] 13 (MW) ==================================================================== Test                             Result       Flag       Units  Drug Present and Declared for Prescription Verification   Tramadol                       >5435        EXPECTED   ng/mg creat   O-Desmethyltramadol            >5435        EXPECTED   ng/mg creat   N-Desmethyltramadol            >5435        EXPECTED   ng/mg creat    Source of tramadol is a prescription medication. O-desmethyltramadol    and N-desmethyltramadol are expected metabolites of tramadol.  ==================================================================== Test                      Result    Flag   Units      Ref Range   Creatinine              92               mg/dL      >=16 ==================================================================== Declared Medications:  The flagging and interpretation on this report are based on the  following declared medications.  Unexpected results may arise from  inaccuracies in the declared medications.   **Note: The testing scope of this panel includes these medications:   Tramadol (Ultram)   **Note: The testing scope of this panel does not include the  following reported medications:   Albuterol (Ventolin HFA)  Albuterol (Duoneb)  Amlodipine (Norvasc)  Atenolol (Tenormin)  Buspirone (Buspar)  Dimenhydrinate  Fluticasone (Advair)  Gabapentin (Neurontin)  Hydroxyzine (Atarax)  Ipratropium (Duoneb)  Latanoprost (Xalatan)  Lisinopril (Zestril)  Loratadine (Claritin)  Lubiprostone (Amitiza)  Magnesium (Mag-Ox)  Meloxicam (Mobic)  Montelukast  (Singulair)  Omeprazole (Prilosec)  Ondansetron (Zofran)  Paliperidone (Invega)  Polyethylene Glycol (MiraLAX)  Prazosin (Minipress)  Probiotic  Salmeterol (Advair)  Sertraline (Zoloft)  Supplement  Trazodone (Desyrel)  Vitamin B ==================================================================== For clinical consultation, please call [phone]. ====================================================================       ROS  Constitutional: Denies any fever or chills Gastrointestinal: No reported hemesis, hematochezia, vomiting, or acute GI distress Musculoskeletal: Denies any acute onset joint swelling, redness, loss of ROM, or weakness Neurological: No reported episodes of acute onset apraxia, aphasia, dysarthria, agnosia, amnesia, paralysis, loss of coordination, or loss of consciousness  Medication Review  Benefiber, Culturelle, Magnesium Oxide -Mg Supplement, albuterol, atenolol, b complex vitamins, busPIRone, dimenhyDRINATE, fluticasone-salmeterol, gabapentin, hydrOXYzine, ipratropium-albuterol, latanoprost, lisinopril, lubiprostone, meloxicam, montelukast, naloxone, omeprazole, ondansetron, paliperidone, polyethylene glycol, prazosin, sertraline, sucralfate, traMADol, and traZODone  History Review  Allergy: Ms. Sudol is allergic to latex, penicillins, codeine, cortisone, and sulfa antibiotics. Drug: Ms. Lorenzetti  reports no history of drug use. Alcohol:  reports no history of alcohol use. Tobacco:  reports that she quit smoking about 32 years ago. Her smoking use included cigarettes. She has a 30.00 pack-year smoking history. She has been exposed to tobacco smoke. She has never used smokeless tobacco. Social: Ms. Kees  reports that she quit smoking about 32 years ago.  Her smoking use included cigarettes. She has a 30.00 pack-year smoking history. She has been exposed to tobacco smoke. She has never used smokeless tobacco. She reports that she does  not drink alcohol and does not use drugs. Medical:  has a past medical history of Anemia, Anxiety, Arthritis, Asthma, Depression, GERD (gastroesophageal reflux disease), Hepatitis-C, History of chemotherapy, Hypertension, Insomnia, Lymphoma (HCC) (12/2013), Personal history of non-Hodgkin lymphomas, and Vitamin D deficiency. Surgical: Ms. Holcomb  has a past surgical history that includes Appendectomy; Trigger finger release; Esophagogastroduodenoscopy (01/17/2011); Colonoscopy (01/17/2011); Abdominal hysterectomy; and Breast biopsy (Left, 2015). Family: family history includes Allergies in her mother; Asthma in her mother and sister; Breast cancer in her cousin; Colon cancer in her paternal aunt; Diabetes in her maternal grandfather and maternal grandmother; Heart disease in her brother, father, paternal grandfather, and paternal grandmother; Liver cancer in her paternal uncle; Ovarian cancer in an other family member; Pancreatic cancer in an other family member.  Laboratory Chemistry Profile   Renal Lab Results  Component Value Date   BUN 8 07/05/2022   CREATININE 0.89 07/05/2022   BCR 9 (L) 07/05/2022   GFRAA >60 08/05/2019   GFRNONAA >60 08/12/2020    Hepatic Lab Results  Component Value Date   AST 20 07/05/2022   ALT 12 07/05/2022   ALBUMIN 3.9 07/05/2022   ALKPHOS 102 07/05/2022   HCVAB REACTIVE (A) 01/12/2014   LIPASE 34 10/31/2010    Electrolytes Lab Results  Component Value Date   NA 140 07/05/2022   K 4.7 07/05/2022   CL 102 07/05/2022   CALCIUM 9.2 07/05/2022   MG 2.2 08/29/2017    Bone Lab Results  Component Value Date   VD25OH 16.7 (L) 03/06/2022   25OHVITD1 21 (L) 08/29/2017   25OHVITD2 <1.0 08/29/2017   25OHVITD3 21 08/29/2017    Inflammation (CRP: Acute Phase) (ESR: Chronic Phase) Lab Results  Component Value Date   CRP 12 (H) 08/29/2017   ESRSEDRATE 46 (H) 08/29/2017         Note: Above Lab results reviewed.  Recent Imaging Review  MM 3D  SCREENING MAMMOGRAM BILATERAL BREAST CLINICAL DATA:  Screening.  EXAM: DIGITAL SCREENING BILATERAL MAMMOGRAM WITH TOMOSYNTHESIS AND CAD  TECHNIQUE: Bilateral screening digital craniocaudal and mediolateral oblique mammograms were obtained. Bilateral screening digital breast tomosynthesis was performed. The images were evaluated with computer-aided detection.  COMPARISON:  Previous exam(s).  ACR Breast Density Category b: There are scattered areas of fibroglandular density.  FINDINGS: There are no findings suspicious for malignancy.  IMPRESSION: No mammographic evidence of malignancy. A result letter of this screening mammogram will be mailed directly to the patient.  RECOMMENDATION: Screening mammogram in one year. (Code:SM-B-01Y)  BI-RADS CATEGORY  1: Negative.  Electronically Signed   By: Beckie Salts M.D.   On: 07/03/2022 17:49 Note: Reviewed        Physical Exam  General appearance: Well nourished, well developed, and well hydrated. In no apparent acute distress Mental status: Alert, oriented x 3 (person, place, & time)       Respiratory: No evidence of acute respiratory distress Eyes: PERLA Vitals: There were no vitals taken for this visit. BMI: Estimated body mass index is 32.12 kg/m as calculated from the following:   Height as of 07/11/22: 5\' 5"  (1.651 m).   Weight as of 07/11/22: 193 lb (87.5 kg). Ideal: Ideal body weight: 57 kg (125 lb 10.6 oz) Adjusted ideal body weight: 69.2 kg (152 lb 9.6 oz)  Assessment   Diagnosis Status  1. Chronic pain syndrome   2. Chronic low back pain (1ry area of Pain) (Bilateral) w/ sciatica (Left)   3. Chronic lower extremity pain (2ry area of Pain) (Left)   4. Chronic upper back pain (3ry area of Pain) (Bilateral) (L>R)   5. Chronic neck pain (4th area of Pain) (Bilateral) (L>R)   6. Pharmacologic therapy   7. Chronic use of opiate for therapeutic purpose   8. Encounter for medication management   9. Encounter for chronic  pain management    Controlled Controlled Controlled   Updated Problems: No problems updated.  Plan of Care  Problem-specific:  No problem-specific Assessment & Plan notes found for this encounter.  Ms. TAHJAE DURR has a current medication list which includes the following long-term medication(s): atenolol, gabapentin, ipratropium-albuterol, lisinopril, lubiprostone, magnesium oxide -mg supplement, meloxicam, montelukast, omeprazole, paliperidone, prazosin, sertraline, sucralfate, tramadol, trazodone, ventolin hfa, and benefiber.  Pharmacotherapy (Medications Ordered): No orders of the defined types were placed in this encounter.  Orders:  No orders of the defined types were placed in this encounter.  Follow-up plan:   No follow-ups on file.      Interventional Therapies  Risk Factors  Complex Considerations:   Allergy: LATEX  Hx Hepatitis C (w/ cirrhosis)   Planned  Pending:      Under consideration:   Diagnostic/therapeutic left cervical ESI #2  Therapeutic left caudal ESI #2  Therapeutic right cervical ESI #3  Therapeutic right Racz procedure #1  Diagnostic right suprascapular NB #1  Therapeutic bilateral lumbar facet RFA #1 (starting on left side)  Therapeutic bilateral cervical facet RFA #1 (starting on left side)    Completed:   Diagnostic/therapeutic left L4-5 LESI x1 (06/14/2024) (100/100/90/90)  Therapeutic right glenohumeral/AC  inj. x4 (12/08/2021) (100/100/100/100)  Therapeutic bilateral lumbar facet MBB x3 (06/03/2019) (100/100/90/90)  Diagnostic bilateral cervical facet MBB x1 (11/06/2017) (100/100/90)  Diagnostic left caudal ESI x1 (08/14/2019) (100/100/100 x8 days/90-100)  Therapeutic right cervical ESI x3 (04/12/2021) (100/100/80-95/80-95)  Therapeutic left cervical ESI x1 (07/19/2021) (100/100/100/100) (helped neck, shoulders, headache, and arms)    Therapeutic  Palliative (PRN) options:   Therapeutic/palliative glenohumeral/AC  inj.    Therapeutic/palliative lumbar facet MBB   Therapeutic/palliative cervical facet MBB   Therapeutic/palliative caudal ESI   Therapeutic/palliative cervical ESI    Pharmacotherapy:  Nonopioids transfer 10/27/2019: [facility], Amitiza, magnesium, Mobic, and Neurontin        Recent Visits Date Type Provider Dept  07/06/22 Office Visit Delano Metz, MD [facility]  06/15/22 Procedure visit Delano Metz, MD [facility]  05/08/22 Office Visit Delano Metz, MD [facility]  Showing recent visits within past 90 days and meeting all other requirements Future Appointments Date Type Provider Dept  07/19/22 Appointment Delano Metz, MD [facility]  Showing future appointments within next 90 days and meeting all other requirements  I discussed the assessment and treatment plan with the patient. The patient was provided an opportunity to ask questions and all were answered. The patient agreed with the plan and demonstrated an understanding of the instructions.  Patient advised to call back or seek an in-person evaluation if the symptoms or condition worsens.  Duration of encounter: *** minutes.  Total time on encounter, as per AMA guidelines included both the face-to-face and non-face-to-face time personally spent by the physician and/or other qualified health care professional(s) on the day of the encounter (includes time in activities that require the physician or other qualified health care professional and does not include time  in activities normally performed by clinical staff). Physician's time may include the following activities when performed: Preparing to see the patient (e.g., pre-charting review of records, searching for previously ordered imaging, lab work, and nerve conduction tests) Review of prior analgesic pharmacotherapies. Reviewing PMP Interpreting ordered tests (e.g., lab work, imaging, nerve conduction  tests) Performing post-procedure evaluations, including interpretation of diagnostic procedures Obtaining and/or reviewing separately obtained history Performing a medically appropriate examination and/or evaluation Counseling and educating the patient/family/caregiver Ordering medications, tests, or procedures Referring and communicating with other health care professionals (when not separately reported) Documenting clinical information in the electronic or other health record Independently interpreting results (not separately reported) and communicating results to the patient/ family/caregiver Care coordination (not separately reported)  Note by: Oswaldo Done, MD Date: 07/19/2022; Time: 12:21 PM

## 2022-07-19 ENCOUNTER — Encounter: Payer: Self-pay | Admitting: Pain Medicine

## 2022-07-19 ENCOUNTER — Ambulatory Visit: Payer: 59

## 2022-07-19 ENCOUNTER — Ambulatory Visit: Payer: 59 | Attending: Pain Medicine | Admitting: Pain Medicine

## 2022-07-19 VITALS — BP 130/74 | HR 88 | Temp 96.2°F | Ht 65.0 in | Wt 193.0 lb

## 2022-07-19 DIAGNOSIS — M4722 Other spondylosis with radiculopathy, cervical region: Secondary | ICD-10-CM

## 2022-07-19 DIAGNOSIS — M549 Dorsalgia, unspecified: Secondary | ICD-10-CM | POA: Insufficient documentation

## 2022-07-19 DIAGNOSIS — M47812 Spondylosis without myelopathy or radiculopathy, cervical region: Secondary | ICD-10-CM | POA: Insufficient documentation

## 2022-07-19 DIAGNOSIS — M5442 Lumbago with sciatica, left side: Secondary | ICD-10-CM | POA: Insufficient documentation

## 2022-07-19 DIAGNOSIS — Z79899 Other long term (current) drug therapy: Secondary | ICD-10-CM | POA: Insufficient documentation

## 2022-07-19 DIAGNOSIS — R937 Abnormal findings on diagnostic imaging of other parts of musculoskeletal system: Secondary | ICD-10-CM | POA: Insufficient documentation

## 2022-07-19 DIAGNOSIS — M50122 Cervical disc disorder at C5-C6 level with radiculopathy: Secondary | ICD-10-CM | POA: Insufficient documentation

## 2022-07-19 DIAGNOSIS — G8929 Other chronic pain: Secondary | ICD-10-CM | POA: Diagnosis not present

## 2022-07-19 DIAGNOSIS — M79605 Pain in left leg: Secondary | ICD-10-CM | POA: Diagnosis not present

## 2022-07-19 DIAGNOSIS — Z79891 Long term (current) use of opiate analgesic: Secondary | ICD-10-CM | POA: Insufficient documentation

## 2022-07-19 DIAGNOSIS — G894 Chronic pain syndrome: Secondary | ICD-10-CM | POA: Insufficient documentation

## 2022-07-19 DIAGNOSIS — G4486 Cervicogenic headache: Secondary | ICD-10-CM | POA: Diagnosis not present

## 2022-07-19 DIAGNOSIS — M542 Cervicalgia: Secondary | ICD-10-CM | POA: Insufficient documentation

## 2022-07-19 DIAGNOSIS — M546 Pain in thoracic spine: Secondary | ICD-10-CM | POA: Diagnosis not present

## 2022-07-19 MED ORDER — TRAMADOL HCL 50 MG PO TABS: 50.0000 mg | ORAL_TABLET | Freq: Four times a day (QID) | ORAL | 5 refills | Status: DC | PRN

## 2022-07-19 NOTE — Patient Instructions (Signed)

## 2022-07-19 NOTE — Progress Notes (Signed)
Nursing Pain Medication Assessment:  Safety precautions to be maintained throughout the outpatient stay will include: orient to surroundings, keep bed in low position, maintain call bell within reach at all times, provide assistance with transfer out of bed and ambulation.  Medication Inspection Compliance: Pill count conducted under aseptic conditions, in front of the patient. Neither the pills nor the bottle was removed from the patient's sight at any time. Once count was completed pills were immediately returned to the patient in their original bottle.  Medication: Tramadol (Ultram) Pill/Patch Count:  96 of 120 pills remain Pill/Patch Appearance: Markings consistent with prescribed medication Bottle Appearance: Standard pharmacy container. Clearly labeled. Filled Date: 07 / 08 / 2024 Last Medication intake:  Today

## 2022-07-21 LAB — TOXASSURE SELECT 13 (MW), URINE

## 2022-07-25 ENCOUNTER — Ambulatory Visit (INDEPENDENT_AMBULATORY_CARE_PROVIDER_SITE_OTHER): Payer: 59 | Admitting: Family Medicine

## 2022-07-25 VITALS — BP 108/72 | HR 83 | Ht 65.0 in | Wt 193.0 lb

## 2022-07-25 DIAGNOSIS — I10 Essential (primary) hypertension: Secondary | ICD-10-CM

## 2022-07-25 NOTE — Progress Notes (Signed)
Pt denies CP, SOB, dizziness, or heart palpitations. taking meds as directed without problems. Denies med side effects. 5 min spent with pt. 

## 2022-08-26 ENCOUNTER — Other Ambulatory Visit: Payer: Self-pay | Admitting: Nurse Practitioner

## 2022-08-26 ENCOUNTER — Other Ambulatory Visit: Payer: Self-pay | Admitting: Family Medicine

## 2022-08-26 DIAGNOSIS — I10 Essential (primary) hypertension: Secondary | ICD-10-CM

## 2022-08-26 DIAGNOSIS — M1991 Primary osteoarthritis, unspecified site: Secondary | ICD-10-CM

## 2022-08-26 DIAGNOSIS — I8500 Esophageal varices without bleeding: Secondary | ICD-10-CM

## 2022-08-26 DIAGNOSIS — K5904 Chronic idiopathic constipation: Secondary | ICD-10-CM

## 2022-08-31 ENCOUNTER — Other Ambulatory Visit: Payer: Self-pay | Admitting: Nurse Practitioner

## 2022-08-31 DIAGNOSIS — I10 Essential (primary) hypertension: Secondary | ICD-10-CM

## 2022-09-05 ENCOUNTER — Other Ambulatory Visit: Payer: Self-pay | Admitting: Family Medicine

## 2022-09-13 ENCOUNTER — Other Ambulatory Visit: Payer: Self-pay | Admitting: Family Medicine

## 2022-09-14 ENCOUNTER — Other Ambulatory Visit: Payer: Self-pay | Admitting: Family Medicine

## 2022-09-14 DIAGNOSIS — J4520 Mild intermittent asthma, uncomplicated: Secondary | ICD-10-CM

## 2022-09-19 ENCOUNTER — Other Ambulatory Visit: Payer: Self-pay | Admitting: Family Medicine

## 2022-09-20 ENCOUNTER — Telehealth: Payer: Self-pay

## 2022-09-20 DIAGNOSIS — N3941 Urge incontinence: Secondary | ICD-10-CM

## 2022-09-20 MED ORDER — MIRABEGRON ER 25 MG PO TB24
25.0000 mg | ORAL_TABLET | Freq: Every day | ORAL | 2 refills | Status: DC
Start: 2022-09-20 — End: 2023-03-12

## 2022-09-20 NOTE — Addendum Note (Signed)
Addended by: Saralyn Pilar on: 09/20/2022 09:51 AM   Modules accepted: Orders

## 2022-09-20 NOTE — Telephone Encounter (Signed)
Meds ordered this encounter  Medications   mirabegron ER (MYRBETRIQ) 25 MG TB24 tablet    Sig: Take 1 tablet (25 mg total) by mouth daily.    Dispense:  30 tablet    Refill:  2    Order Specific Question:   Supervising Provider    Answer:   Agapito Games [[id number]]   Prescription sent for mirabegron 25 mg daily for urge incontinence.  It is possible that insurance may require a trial of oxybutynin prior to pain for mirabegron, will await insurance response.

## 2022-09-20 NOTE — Telephone Encounter (Signed)
Per fax from [facility], patient received samples of Mrybetriq and would now like a prescription for it.   Please advise.

## 2022-09-29 ENCOUNTER — Ambulatory Visit: Payer: 59 | Admitting: Physical Therapy

## 2022-10-13 ENCOUNTER — Other Ambulatory Visit: Payer: Self-pay | Admitting: Nurse Practitioner

## 2022-10-13 ENCOUNTER — Ambulatory Visit: Payer: 59 | Attending: Physical Medicine and Rehabilitation | Admitting: Physical Therapy

## 2022-10-13 ENCOUNTER — Other Ambulatory Visit: Payer: Self-pay | Admitting: Family Medicine

## 2022-10-13 DIAGNOSIS — M542 Cervicalgia: Secondary | ICD-10-CM | POA: Insufficient documentation

## 2022-10-13 DIAGNOSIS — M25511 Pain in right shoulder: Secondary | ICD-10-CM | POA: Diagnosis not present

## 2022-10-13 DIAGNOSIS — M797 Fibromyalgia: Secondary | ICD-10-CM | POA: Diagnosis not present

## 2022-10-13 DIAGNOSIS — G8929 Other chronic pain: Secondary | ICD-10-CM | POA: Insufficient documentation

## 2022-10-13 DIAGNOSIS — M810 Age-related osteoporosis without current pathological fracture: Secondary | ICD-10-CM

## 2022-10-13 DIAGNOSIS — M6281 Muscle weakness (generalized): Secondary | ICD-10-CM | POA: Diagnosis not present

## 2022-10-13 DIAGNOSIS — E2839 Other primary ovarian failure: Secondary | ICD-10-CM

## 2022-10-13 DIAGNOSIS — M5412 Radiculopathy, cervical region: Secondary | ICD-10-CM | POA: Diagnosis not present

## 2022-10-13 DIAGNOSIS — M25512 Pain in left shoulder: Secondary | ICD-10-CM | POA: Diagnosis not present

## 2022-10-13 DIAGNOSIS — M5459 Other low back pain: Secondary | ICD-10-CM | POA: Insufficient documentation

## 2022-10-13 DIAGNOSIS — M5416 Radiculopathy, lumbar region: Secondary | ICD-10-CM | POA: Insufficient documentation

## 2022-10-13 NOTE — Therapy (Signed)
OUTPATIENT PHYSICAL THERAPY CERVICAL AND THORACOLUMBAR EVALUATION   Patient Name: Rachael West MRN: [id number] DOB:[birth year], 67 y.o., female Today's Date: 10/13/2022  END OF SESSION:  PT End of Session - 10/13/22 0846     Visit Number 1    Number of Visits 17   with eval   Date for PT Re-Evaluation 01/05/23    Authorization Type [employer] Medicare    PT Start Time 0845    PT Stop Time 0925    PT Time Calculation (min) 40 min    Activity Tolerance Patient tolerated treatment well    Behavior During Therapy [facility] for tasks assessed/performed             Past Medical History:  Diagnosis Date   Anemia    Anxiety    Arthritis    Asthma    Depression    GERD (gastroesophageal reflux disease)    Hepatitis-C    History of chemotherapy    Hypertension    Insomnia    Lymphoma (HCC) 12/2013   aggressive lymphoma   Personal history of non-Hodgkin lymphomas    Vitamin D deficiency    Past Surgical History:  Procedure Laterality Date   ABDOMINAL HYSTERECTOMY     APPENDECTOMY     BREAST BIOPSY Left 2015   lymphoma   COLONOSCOPY  01/17/2011   Procedure: COLONOSCOPY;  Surgeon: Vertell Novak., MD;  Location: [facility];  Service: Endoscopy;  Laterality: N/A;   ESOPHAGOGASTRODUODENOSCOPY  01/17/2011   Procedure: ESOPHAGOGASTRODUODENOSCOPY (EGD);  Surgeon: Vertell Novak., MD;  Location: [facility];  Service: Endoscopy;  Laterality: N/A;   TRIGGER FINGER RELEASE     Patient Active Problem List   Diagnosis Date Noted   Abnormal MRI, lumbar spine (05/17/2022) 06/15/2022   Lumbosacral sensory radiculopathy at L5 (Left) 05/08/2022   Seasonal allergies 03/06/2022   Osteoarthritis of glenohumeral joint (Right) 12/08/2021    Class: Chronic   Primary osteoarthritis of shoulder (Right) 12/08/2021    Class: Chronic   Rotator cuff arthropathy of shoulder (Right) 12/08/2021    Class: Chronic   Tendinopathy of rotator cuff (Right) 12/08/2021    Class: Chronic   Carpal  tunnel syndrome (Bilateral) 11/30/2021   Chronic shoulder pain (Left) 11/30/2021    Class: Chronic   Fall (11/25/21) 11/30/2021   Incomplete emptying of bladder 10/01/2021   Increased frequency of urination 10/01/2021   Esophageal varices (HCC) 07/19/2021   Fasting hypoglycemia 07/19/2021   Trigger finger, middle finger (Right) 09/23/2020   Trigger point of shoulder region (Right) 09/23/2020   Chronic use of opiate for therapeutic purpose 07/26/2020   Abnormal MRI, cervical spine (11/12/2019) 04/15/2020   Chronic neuropathic pain 04/15/2020   Disturbance of skin sensation 04/15/2020   Hyperalgesia 04/15/2020   Musculoskeletal disorder involving upper trapezius muscle (Right) 04/07/2020   Arthralgia of shoulder region (Right) 11/27/2019   Shoulder enthesopathy (Right) 11/27/2019   Chronic shoulder pain (Right) 11/13/2019   Cervical disc disorder at C5-C6 level with radiculopathy 08/07/2019   Cervical radiculopathy at C6 08/07/2019   Osteoarthrosis of multiple sites 08/07/2019   Radicular pain of upper extremity (Right) 08/07/2019   Chronic upper extremity pain (Right) 08/07/2019   Chronic low back pain (1ry area of Pain) (Bilateral) (L>R) w/o sciatica 09/30/2018   Chronic musculoskeletal pain 07/31/2018   Therapeutic opioid-induced constipation (OIC) 07/31/2018   Gait disturbance 11/28/2017   Cervical facet syndrome (Bilateral) (R>L) 10/24/2017   Spondylosis without myelopathy or radiculopathy, cervical region 10/24/2017   Latex precautions,  history of latex allergy 10/09/2017   Chronic idiopathic gout 09/27/2017   DDD (degenerative disc disease), cervical 09/27/2017   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) 09/27/2017   Chronic shoulder pain (Bilateral) (R>L) 09/26/2017   Chronic hip pain (Bilateral) (L>R) 09/26/2017   Cervicalgia 09/26/2017   DDD (degenerative disc disease), lumbar 09/25/2017   Lumbar facet arthropathy 09/25/2017   Lumbar facet syndrome  (Bilateral) (L>R) 09/25/2017   Spondylosis without myelopathy or radiculopathy, lumbar region 09/25/2017   DDD (degenerative disc disease), thoracic 09/25/2017   Thoracolumbar dextroscoliosis 09/25/2017   Vitamin D deficiency 09/25/2017   Cervicogenic headache 09/25/2017   Other dysphagia 09/24/2017   Tendinitis of rotator cuff (Left) 09/24/2017   Tendinitis of rotator cuff (Right) 09/24/2017   Fibromyalgia 09/24/2017   Chronic low back pain (1ry area of Pain) (Bilateral) w/ sciatica (Left) 08/29/2017   Chronic lower extremity pain (2ry area of Pain) (Left) 08/29/2017   Chronic upper back pain (3ry area of Pain) (Bilateral) (L>R) 08/29/2017   Chronic neck pain (4th area of Pain) (Bilateral) (L>R) 08/29/2017   Occipital headache 08/29/2017   Chronic knee pain (Bilateral) (R>L) 08/29/2017   Chronic pain syndrome 08/29/2017   Long term current use of opiate analgesic 08/29/2017   Chronic sacroiliac joint pain 08/29/2017   Hyperlipidemia 07/31/2017   GERD (gastroesophageal reflux disease) 05/01/2017   Anxiety 05/01/2017   Depression 05/01/2017   Obesity (BMI 30-39.9) 09/12/2014   Cirrhosis (HCC) 09/08/2014   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   Chronic idiopathic constipation 04/24/2012   Chronic hepatitis C without hepatic coma (HCC) 05/25/2011   Intermittent asthma 05/25/2011   Essential hypertension 05/25/2011   Chronic gastritis 05/25/2011   Stress incontinence 02/10/2011   Urge incontinence 02/10/2011    PCP: Melida Quitter, PA  REFERRING PROVIDER: Delano Metz, MD  REFERRING DIAG: M54.2, G89.29  - chronic neck pain  Rationale for Evaluation and Treatment: Rehabilitation  THERAPY DIAG:  Muscle weakness (generalized)  Cervicalgia  Radiculopathy, cervical region  Other low back pain  Chronic pain of both shoulders  Fibromyalgia  Radiculopathy, lumbar region  ONSET DATE: 07/20/2022 (referral date)  SUBJECTIVE:                                                                                                                                                                                            SUBJECTIVE STATEMENT: Pt reports a history of chronic pain, reports she has been seeing the pain clinic and has had PT in the past for her back pain. Pt reports neck pain, back pain, sciatic, and a tear in her R RTC. Pt states, "I can lift  my arms and move them but not for long periods of time". Pt reports being unable to mop or sweep due to pain. Pt reports that pain medication is not effective for managing her pain.  Pt enters clinic with a SPC, reports she only uses this in the community. Pt does not drive, took an Pharmacist, community to her appointment.  PERTINENT HISTORY:  Per pain clinic note from Dr. Laban Emperor:  Ms. Nissley has Large B-cell lymphoma (HCC); Chronic low back pain (1ry area of Pain) (Bilateral) w/ sciatica (Left); Chronic lower extremity pain (2ry area of Pain) (Left); Chronic upper back pain (3ry area of Pain) (Bilateral) (L>R); Chronic neck pain (4th area of Pain) (Bilateral) (L>R); Occipital headache; Chronic knee pain (Bilateral) (R>L); Chronic pain syndrome; Chronic sacroiliac joint pain; DDD (degenerative disc disease), lumbar; Lumbar facet arthropathy; Lumbar facet syndrome (Bilateral) (L>R); Spondylosis without myelopathy or radiculopathy, lumbar region; DDD (degenerative disc disease), thoracic; Thoracolumbar dextroscoliosis; Cervicogenic headache; Tendinitis of rotator cuff (Left); Tendinitis of rotator cuff (Right); Fibromyalgia; Chronic shoulder pain (Bilateral) (R>L); Chronic hip pain (Bilateral) (L>R); Cervicalgia; Chronic idiopathic gout; DDD (degenerative disc disease), cervical; 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); Cervical facet syndrome (Bilateral) (R>L); Spondylosis without myelopathy or radiculopathy, cervical region; Chronic musculoskeletal pain; Chronic low back pain (1ry area of Pain) (Bilateral) (L>R) w/o  sciatica; Cervical disc disorder at C5-C6 level with radiculopathy; Cervical radiculopathy at C6; Osteoarthrosis of multiple sites; Radicular pain of upper extremity (Right); Chronic upper extremity pain (Right); Chronic shoulder pain (Right); Arthralgia of shoulder region (Right); Shoulder enthesopathy (Right); Musculoskeletal disorder involving upper trapezius muscle (Right); Abnormal MRI, cervical spine (11/12/2019); Chronic neuropathic pain; Disturbance of skin sensation; Hyperalgesia; Trigger finger, middle finger (Right); Trigger point of shoulder region (Right); Carpal tunnel syndrome (Bilateral); Chronic shoulder pain (Left); Osteoarthritis of glenohumeral joint (Right); Primary osteoarthritis of shoulder (Right); Rotator cuff arthropathy of shoulder (Right); Tendinopathy of rotator cuff (Right); Lumbosacral sensory radiculopathy at L5 (Left); and Abnormal MRI, lumbar spine (05/17/2022) on their pertinent problem list.  PAIN:  Are you having pain? Yes: NPRS scale: 3/10 Pain location: neck, carpal tunnel in arms Pain description: dull ache Aggravating factors: turning side to side Relieving factors: not moving, heating pad, TENS unit   Are you having pain? Yes: NPRS scale: 3/10 Pain location: back and into B buttocks and down into legs Pain description: dull ache constantly Aggravating factors: sweeping, standing for longer periods of time Relieving factors: sitting down, heating pad, TENS unit  PRECAUTIONS: None  RED FLAGS: None   WEIGHT BEARING RESTRICTIONS: No  FALLS:  Has patient fallen in last 6 months? No  LIVING ENVIRONMENT: Lives with: lives with their family Lives in: House/apartment Stairs: No Has following equipment at home: Single point cane  OCCUPATION: retired  PLOF: Independent with gait, Independent with transfers, and Requires assistive device for independence  PATIENT GOALS: "more mobility"  NEXT MD VISIT: sees Dr. Laban Emperor in December (pain management),  no scheduled follow up with Dr. Ethelene Hal (ortho)  OBJECTIVE:  Note: Objective measures were completed at Evaluation unless otherwise noted.  DIAGNOSTIC FINDINGS:  Lumbar spine MRI  05/17/2022 IMPRESSION: 1. L2-3: Disc degeneration with bulging of the disc more prominent towards the left. Mild foraminal encroachment, left more than right. Some potential the left L2 nerve could be affected. 2. L4-5: Disc bulge. Facet degeneration and hypertrophy. Mild narrowing of both lateral recesses but no visible neural compression. The facet arthritis could contribute to back pain or referred facet syndrome pain. 3. Lesser, non-compressive degenerative changes  at L3-4 and L5-S1  Cervical spine MRI 11/12/2019 (most recent imaging in chart) IMPRESSION: 1. Multilevel disc and facet degeneration as described. 2. Right foraminal impingement at C3-4, C5-6, and C6-7. 3. Left foraminal impingement at C5-6 and C6-7. 4. Diffusely patent spinal canal.  PATIENT SURVEYS:  Modified Oswestry 25/50  NDI 35/50  SCREENING FOR RED FLAGS: Bowel or bladder incontinence: No Spinal tumors: No Cauda equina syndrome: No Compression fracture: No Abdominal aneurysm: No  COGNITION: Overall cognitive status: Within functional limits for tasks assessed     SENSATION: Heaviness on R side (UE) WFL in BLE    POSTURE: rounded shoulders and forward head   CERVICAL ROM:   Active ROM AROM (deg) eval  Flexion 25* (pain in R shoulder)  Extension 18* (pain in R shoulder)  Right lateral flexion 20*  Left lateral flexion 20* (Pain in L side of neck)   Right rotation 30* (pain in base of neck)  Left rotation 26* (pain in base of neck)   (Blank rows = not tested)   UPPER EXTREMITY ROM:  Active ROM Right eval Left eval  Shoulder flexion 140*, pain in shoulders and mid-back (3/10) 155*, pain in shoulders and mid-back (3/10)  Shoulder extension    Shoulder abduction WFL, pain R>L shoulder WFL, pain R>L shoulder   Shoulder adduction    Shoulder extension    Shoulder internal rotation 30* 20*  Shoulder external rotation Close to 90* painful Close to 90* Pain across anterior joint line  Elbow flexion    Elbow extension    Wrist flexion    Wrist extension    Wrist ulnar deviation    Wrist radial deviation    Wrist pronation    Wrist supination     (Blank rows = not tested)   UPPER EXTREMITY MMT:  MMT Right eval Left eval  Shoulder flexion pain 4  Shoulder extension    Shoulder abduction pain pain  Shoulder adduction    Shoulder extension    Shoulder internal rotation    Shoulder external rotation    Middle trapezius    Lower trapezius    Elbow flexion 5 5  Elbow extension 5 5  Wrist flexion    Wrist extension    Wrist ulnar deviation    Wrist radial deviation    Wrist pronation    Wrist supination    Grip strength WFL WFL   (Blank rows = not tested)    LUMBAR ROM:   AROM eval  Flexion WFL, increase in low back pain  Extension WFL, increase in low back pain  Right lateral flexion 75%, neck pain  Left lateral flexion 75%, neck pain  Right rotation 75%, no pain  Left rotation 50%, no pain   (Blank rows = not tested)   LOWER EXTREMITY MMT:    MMT Right eval Left eval  Hip flexion Pain in hip/upper thigh that goes down into knee  4+  Hip extension    Hip abduction    Hip adduction    Hip internal rotation    Hip external rotation    Knee flexion 5 5  Knee extension 5 5  Ankle dorsiflexion 5 5  Ankle plantarflexion    Ankle inversion    Ankle eversion     (Blank rows = not tested)   CERVICAL SPECIAL TESTS:  Upper limb tension test (ULTT): Positive Median nerve (+) in RUE Ulnar nerve and radial nerve (-) BUE   LUMBAR SPECIAL TESTS:  Slump test: Positive in  LLE only   TODAY'S TREATMENT:                                                                                                                              PT Evaluation    PATIENT EDUCATION:   Education details: Eval findings, PT POC, results of examination Person educated: Patient Education method: Explanation Education comprehension: verbalized understanding and needs further education  HOME EXERCISE PROGRAM: To be initiated  ASSESSMENT:  CLINICAL IMPRESSION: Patient is a 67 year old female referred to Neuro OPPT for chronic pain and cervical and lumbar radiculopathy.   Pt's PMH is significant for: *** The following deficits were present during the exam: decreased cervical ROM, decreased shoulder ROM, decreased lumbar ROM, decreased UE strength, decreased LE strength, and increased pain. Pt would benefit from skilled PT to address these impairments and functional limitations to maximize functional mobility independence.   OBJECTIVE IMPAIRMENTS: decreased activity tolerance, decreased mobility, decreased ROM, decreased strength, increased fascial restrictions, impaired perceived functional ability, impaired flexibility, impaired sensation, impaired UE functional use, improper body mechanics, postural dysfunction, and pain.   ACTIVITY LIMITATIONS: carrying, lifting, bending, sitting, standing, squatting, stairs, transfers, and bed mobility  PARTICIPATION LIMITATIONS: meal prep, cleaning, laundry, driving, shopping, and community activity  PERSONAL FACTORS: {Personal factors:25162} are also affecting patient's functional outcome.   REHAB POTENTIAL: {rehabpotential:25112}  CLINICAL DECISION MAKING: {clinical decision making:25114}  EVALUATION COMPLEXITY: Low   GOALS: Goals reviewed with patient? Yes  SHORT TERM GOALS: Target date: 11/10/2022  *** Baseline: Goal status: INITIAL  2.  *** Baseline:  Goal status: INITIAL  3.  *** Baseline:  Goal status: INITIAL  4.  *** Baseline:  Goal status: INITIAL  5.  *** Baseline:  Goal status: INITIAL  6.  *** Baseline:  Goal status: INITIAL  LONG TERM GOALS: Target date: 12/08/2022  *** Baseline:  Goal  status: INITIAL  2.  *** Baseline:  Goal status: INITIAL  3.  *** Baseline:  Goal status: INITIAL  4.  *** Baseline:  Goal status: INITIAL  5.  *** Baseline:  Goal status: INITIAL  6.  *** Baseline:  Goal status: INITIAL  PLAN:  PT FREQUENCY: 2x/week  PT DURATION: 8 weeks  PLANNED INTERVENTIONS: {rehab planned interventions:25118::"Therapeutic exercises","Therapeutic activity","Neuromuscular re-education","Balance training","Gait training","Patient/Family education","Self Care","Joint mobilization"}.  PLAN FOR NEXT SESSION: ***    Peter Congo, PT, DPT, CSRS  10/13/2022, 9:39 AM

## 2022-10-17 ENCOUNTER — Ambulatory Visit
Admission: RE | Admit: 2022-10-17 | Discharge: 2022-10-17 | Disposition: A | Discharge status: Home / Self Care | Payer: 59 | Source: Ambulatory Visit | Attending: Nurse Practitioner | Admitting: Nurse Practitioner

## 2022-10-17 ENCOUNTER — Encounter: Payer: Self-pay | Admitting: Nurse Practitioner

## 2022-10-17 DIAGNOSIS — M810 Age-related osteoporosis without current pathological fracture: Secondary | ICD-10-CM

## 2022-10-17 DIAGNOSIS — E2839 Other primary ovarian failure: Secondary | ICD-10-CM

## 2022-10-17 DIAGNOSIS — E349 Endocrine disorder, unspecified: Secondary | ICD-10-CM | POA: Diagnosis not present

## 2022-10-19 ENCOUNTER — Other Ambulatory Visit: Payer: Self-pay

## 2022-10-19 DIAGNOSIS — I10 Essential (primary) hypertension: Secondary | ICD-10-CM

## 2022-10-19 DIAGNOSIS — E785 Hyperlipidemia, unspecified: Secondary | ICD-10-CM

## 2022-10-23 DIAGNOSIS — R339 Retention of urine, unspecified: Secondary | ICD-10-CM | POA: Diagnosis not present

## 2022-10-23 DIAGNOSIS — G8929 Other chronic pain: Secondary | ICD-10-CM | POA: Diagnosis not present

## 2022-10-23 DIAGNOSIS — K5900 Constipation, unspecified: Secondary | ICD-10-CM | POA: Diagnosis not present

## 2022-10-23 DIAGNOSIS — Z91199 Patient's noncompliance with other medical treatment and regimen due to unspecified reason: Secondary | ICD-10-CM | POA: Diagnosis not present

## 2022-10-24 ENCOUNTER — Ambulatory Visit: Payer: 59 | Admitting: Physical Therapy

## 2022-10-24 DIAGNOSIS — M5459 Other low back pain: Secondary | ICD-10-CM | POA: Diagnosis not present

## 2022-10-24 DIAGNOSIS — M6281 Muscle weakness (generalized): Secondary | ICD-10-CM | POA: Diagnosis not present

## 2022-10-24 DIAGNOSIS — M542 Cervicalgia: Secondary | ICD-10-CM | POA: Diagnosis not present

## 2022-10-24 DIAGNOSIS — M25512 Pain in left shoulder: Secondary | ICD-10-CM | POA: Diagnosis not present

## 2022-10-24 DIAGNOSIS — M5412 Radiculopathy, cervical region: Secondary | ICD-10-CM | POA: Diagnosis not present

## 2022-10-24 DIAGNOSIS — M797 Fibromyalgia: Secondary | ICD-10-CM | POA: Diagnosis not present

## 2022-10-24 DIAGNOSIS — G8929 Other chronic pain: Secondary | ICD-10-CM | POA: Diagnosis not present

## 2022-10-24 DIAGNOSIS — M25511 Pain in right shoulder: Secondary | ICD-10-CM | POA: Diagnosis not present

## 2022-10-24 DIAGNOSIS — M5416 Radiculopathy, lumbar region: Secondary | ICD-10-CM | POA: Diagnosis not present

## 2022-10-24 NOTE — Therapy (Signed)
OUTPATIENT PHYSICAL THERAPY CERVICAL AND THORACOLUMBAR TREATMENT   Patient Name: Rachael West MRN: [id number] DOB:1955/07/26, 67 y.o., female Today's Date: 10/24/2022  END OF SESSION:  PT End of Session - 10/24/22 0934     Visit Number 2    Number of Visits 17   with eval   Date for PT Re-Evaluation 01/05/23    Authorization Type [employer] Medicare    PT Start Time 0933    PT Stop Time 1011    PT Time Calculation (min) 38 min    Activity Tolerance Patient tolerated treatment well    Behavior During Therapy [facility] for tasks assessed/performed              Past Medical History:  Diagnosis Date   Anemia    Anxiety    Arthritis    Asthma    Depression    GERD (gastroesophageal reflux disease)    Hepatitis-C    History of chemotherapy    Hypertension    Insomnia    Lymphoma (HCC) 12/2013   aggressive lymphoma   Personal history of non-Hodgkin lymphomas    Vitamin D deficiency    Past Surgical History:  Procedure Laterality Date   ABDOMINAL HYSTERECTOMY     APPENDECTOMY     BREAST BIOPSY Left 2015   lymphoma   COLONOSCOPY  01/17/2011   Procedure: COLONOSCOPY;  Surgeon: Vertell Novak., MD;  Location: [facility];  Service: Endoscopy;  Laterality: N/A;   ESOPHAGOGASTRODUODENOSCOPY  01/17/2011   Procedure: ESOPHAGOGASTRODUODENOSCOPY (EGD);  Surgeon: Vertell Novak., MD;  Location: [facility];  Service: Endoscopy;  Laterality: N/A;   TRIGGER FINGER RELEASE     Patient Active Problem List   Diagnosis Date Noted   Abnormal MRI, lumbar spine (05/17/2022) 06/15/2022   Lumbosacral sensory radiculopathy at L5 (Left) 05/08/2022   Seasonal allergies 03/06/2022   Osteoarthritis of glenohumeral joint (Right) 12/08/2021    Class: Chronic   Primary osteoarthritis of shoulder (Right) 12/08/2021    Class: Chronic   Rotator cuff arthropathy of shoulder (Right) 12/08/2021    Class: Chronic   Tendinopathy of rotator cuff (Right) 12/08/2021    Class: Chronic   Carpal  tunnel syndrome (Bilateral) 11/30/2021   Chronic shoulder pain (Left) 11/30/2021    Class: Chronic   Fall (11/25/21) 11/30/2021   Incomplete emptying of bladder 10/01/2021   Increased frequency of urination 10/01/2021   Esophageal varices (HCC) 07/19/2021   Fasting hypoglycemia 07/19/2021   Trigger finger, middle finger (Right) 09/23/2020   Trigger point of shoulder region (Right) 09/23/2020   Chronic use of opiate for therapeutic purpose 07/26/2020   Abnormal MRI, cervical spine (11/12/2019) 04/15/2020   Chronic neuropathic pain 04/15/2020   Disturbance of skin sensation 04/15/2020   Hyperalgesia 04/15/2020   Musculoskeletal disorder involving upper trapezius muscle (Right) 04/07/2020   Arthralgia of shoulder region (Right) 11/27/2019   Shoulder enthesopathy (Right) 11/27/2019   Chronic shoulder pain (Right) 11/13/2019   Cervical disc disorder at C5-C6 level with radiculopathy 08/07/2019   Cervical radiculopathy at C6 08/07/2019   Osteoarthrosis of multiple sites 08/07/2019   Radicular pain of upper extremity (Right) 08/07/2019   Chronic upper extremity pain (Right) 08/07/2019   Chronic low back pain (1ry area of Pain) (Bilateral) (L>R) w/o sciatica 09/30/2018   Chronic musculoskeletal pain 07/31/2018   Therapeutic opioid-induced constipation (OIC) 07/31/2018   Gait disturbance 11/28/2017   Cervical facet syndrome (Bilateral) (R>L) 10/24/2017   Spondylosis without myelopathy or radiculopathy, cervical region 10/24/2017   Latex  precautions, history of latex allergy 10/09/2017   Chronic idiopathic gout 09/27/2017   DDD (degenerative disc disease), cervical 09/27/2017   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) 09/27/2017   Chronic shoulder pain (Bilateral) (R>L) 09/26/2017   Chronic hip pain (Bilateral) (L>R) 09/26/2017   Cervicalgia 09/26/2017   DDD (degenerative disc disease), lumbar 09/25/2017   Lumbar facet arthropathy 09/25/2017   Lumbar facet syndrome  (Bilateral) (L>R) 09/25/2017   Spondylosis without myelopathy or radiculopathy, lumbar region 09/25/2017   DDD (degenerative disc disease), thoracic 09/25/2017   Thoracolumbar dextroscoliosis 09/25/2017   Vitamin D deficiency 09/25/2017   Cervicogenic headache 09/25/2017   Other dysphagia 09/24/2017   Tendinitis of rotator cuff (Left) 09/24/2017   Tendinitis of rotator cuff (Right) 09/24/2017   Fibromyalgia 09/24/2017   Chronic low back pain (1ry area of Pain) (Bilateral) w/ sciatica (Left) 08/29/2017   Chronic lower extremity pain (2ry area of Pain) (Left) 08/29/2017   Chronic upper back pain (3ry area of Pain) (Bilateral) (L>R) 08/29/2017   Chronic neck pain (4th area of Pain) (Bilateral) (L>R) 08/29/2017   Occipital headache 08/29/2017   Chronic knee pain (Bilateral) (R>L) 08/29/2017   Chronic pain syndrome 08/29/2017   Long term current use of opiate analgesic 08/29/2017   Chronic sacroiliac joint pain 08/29/2017   Hyperlipidemia 07/31/2017   GERD (gastroesophageal reflux disease) 05/01/2017   Anxiety 05/01/2017   Depression 05/01/2017   Obesity (BMI 30-39.9) 09/12/2014   Cirrhosis (HCC) 09/08/2014   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   Chronic idiopathic constipation 04/24/2012   Chronic hepatitis C without hepatic coma (HCC) 05/25/2011   Intermittent asthma 05/25/2011   Essential hypertension 05/25/2011   Chronic gastritis 05/25/2011   Stress incontinence 02/10/2011   Urge incontinence 02/10/2011    PCP: Rachael Quitter, PA  REFERRING PROVIDER: Delano Metz, MD  REFERRING DIAG: M54.2, G89.29  - chronic neck pain  Rationale for Evaluation and Treatment: Rehabilitation  THERAPY DIAG:  Muscle weakness (generalized)  Other low back pain  Cervicalgia  ONSET DATE: 07/20/2022 (referral date)  SUBJECTIVE:                                                                                                                                                                                            SUBJECTIVE STATEMENT: Pt presents w/walking stick. Reports 3/10 pain today, "I am just used to it". Denies falls or acute changes   PERTINENT HISTORY:  Per pain clinic note from Dr. Laban West:  Rachael West has Large B-cell lymphoma (HCC); Chronic low back pain (1ry area of Pain) (Bilateral) w/ sciatica (Left); Chronic lower extremity pain (2ry area of Pain) (Left);  Chronic upper back pain (3ry area of Pain) (Bilateral) (L>R); Chronic neck pain (4th area of Pain) (Bilateral) (L>R); Occipital headache; Chronic knee pain (Bilateral) (R>L); Chronic pain syndrome; Chronic sacroiliac joint pain; DDD (degenerative disc disease), lumbar; Lumbar facet arthropathy; Lumbar facet syndrome (Bilateral) (L>R); Spondylosis without myelopathy or radiculopathy, lumbar region; DDD (degenerative disc disease), thoracic; Thoracolumbar dextroscoliosis; Cervicogenic headache; Tendinitis of rotator cuff (Left); Tendinitis of rotator cuff (Right); Fibromyalgia; Chronic shoulder pain (Bilateral) (R>L); Chronic hip pain (Bilateral) (L>R); Cervicalgia; Chronic idiopathic gout; DDD (degenerative disc disease), cervical; 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); Cervical facet syndrome (Bilateral) (R>L); Spondylosis without myelopathy or radiculopathy, cervical region; Chronic musculoskeletal pain; Chronic low back pain (1ry area of Pain) (Bilateral) (L>R) w/o sciatica; Cervical disc disorder at C5-C6 level with radiculopathy; Cervical radiculopathy at C6; Osteoarthrosis of multiple sites; Radicular pain of upper extremity (Right); Chronic upper extremity pain (Right); Chronic shoulder pain (Right); Arthralgia of shoulder region (Right); Shoulder enthesopathy (Right); Musculoskeletal disorder involving upper trapezius muscle (Right); Abnormal MRI, cervical spine (11/12/2019); Chronic neuropathic pain; Disturbance of skin sensation; Hyperalgesia; Trigger finger, middle finger (Right);  Trigger point of shoulder region (Right); Carpal tunnel syndrome (Bilateral); Chronic shoulder pain (Left); Osteoarthritis of glenohumeral joint (Right); Primary osteoarthritis of shoulder (Right); Rotator cuff arthropathy of shoulder (Right); Tendinopathy of rotator cuff (Right); Lumbosacral sensory radiculopathy at L5 (Left); and Abnormal MRI, lumbar spine (05/17/2022) on their pertinent problem list.  PAIN:  Are you having pain? Yes: NPRS scale: 3/10 Pain location: neck, carpal tunnel in arms Pain description: dull ache Aggravating factors: turning side to side Relieving factors: not moving, heating pad, TENS unit   Are you having pain? Yes: NPRS scale: 3/10 Pain location: back and into B buttocks and down into legs Pain description: dull ache constantly Aggravating factors: sweeping, standing for longer periods of time Relieving factors: sitting down, heating pad, TENS unit  PRECAUTIONS: None  RED FLAGS: None   WEIGHT BEARING RESTRICTIONS: No  FALLS:  Has patient fallen in last 6 months? No  LIVING ENVIRONMENT: Lives with: lives with their family Lives in: House/apartment Stairs: No Has following equipment at home: Single point cane  OCCUPATION: retired  PLOF: Independent with gait, Independent with transfers, and Requires assistive device for independence  PATIENT GOALS: "more mobility"  NEXT MD VISIT: sees Dr. Laban West in December (pain management), no scheduled follow up with Dr. Ethelene Hal (ortho)  OBJECTIVE:  Note: Objective measures were completed at Evaluation unless otherwise noted.  DIAGNOSTIC FINDINGS:  Lumbar spine MRI  05/17/2022 IMPRESSION: 1. L2-3: Disc degeneration with bulging of the disc more prominent towards the left. Mild foraminal encroachment, left more than right. Some potential the left L2 nerve could be affected. 2. L4-5: Disc bulge. Facet degeneration and hypertrophy. Mild narrowing of both lateral recesses but no visible neural compression.  The facet arthritis could contribute to back pain or referred facet syndrome pain. 3. Lesser, non-compressive degenerative changes at L3-4 and L5-S1  Cervical spine MRI 11/12/2019 (most recent imaging in chart) IMPRESSION: 1. Multilevel disc and facet degeneration as described. 2. Right foraminal impingement at C3-4, C5-6, and C6-7. 3. Left foraminal impingement at C5-6 and C6-7. 4. Diffusely patent spinal canal.  PATIENT SURVEYS:  Modified Oswestry 25/50  NDI 35/50  SCREENING FOR RED FLAGS: Bowel or bladder incontinence: No Spinal tumors: No Cauda equina syndrome: No Compression fracture: No Abdominal aneurysm: No  COGNITION: Overall cognitive status: Within functional limits for tasks assessed     SENSATION: Heaviness on R side (UE) [facility]  in BLE    POSTURE: rounded shoulders and forward head   CERVICAL ROM:   Active ROM AROM (deg) eval  Flexion 25* (pain in R shoulder)  Extension 18* (pain in R shoulder)  Right lateral flexion 20*  Left lateral flexion 20* (Pain in L side of neck)   Right rotation 30* (pain in base of neck)  Left rotation 26* (pain in base of neck)   (Blank rows = not tested)   UPPER EXTREMITY ROM:  Active ROM Right eval Left eval  Shoulder flexion 140*, pain in shoulders and mid-back (3/10) 155*, pain in shoulders and mid-back (3/10)  Shoulder extension    Shoulder abduction WFL, pain R>L shoulder WFL, pain R>L shoulder  Shoulder adduction    Shoulder extension    Shoulder internal rotation 30* 20*  Shoulder external rotation Close to 90* painful Close to 90* Pain across anterior joint line  Elbow flexion    Elbow extension    Wrist flexion    Wrist extension    Wrist ulnar deviation    Wrist radial deviation    Wrist pronation    Wrist supination     (Blank rows = not tested)   UPPER EXTREMITY MMT:  MMT Right eval Left eval  Shoulder flexion pain 4  Shoulder extension    Shoulder abduction pain pain  Shoulder adduction     Shoulder extension    Shoulder internal rotation    Shoulder external rotation    Middle trapezius    Lower trapezius    Elbow flexion 5 5  Elbow extension 5 5  Wrist flexion    Wrist extension    Wrist ulnar deviation    Wrist radial deviation    Wrist pronation    Wrist supination    Grip strength WFL WFL   (Blank rows = not tested)    LUMBAR ROM:   AROM eval  Flexion WFL, increase in low back pain  Extension WFL, increase in low back pain  Right lateral flexion 75%, neck pain  Left lateral flexion 75%, neck pain  Right rotation 75%, no pain  Left rotation 50%, no pain   (Blank rows = not tested)   LOWER EXTREMITY MMT:    MMT Right eval Left eval  Hip flexion Pain in hip/upper thigh that goes down into knee  4+  Hip extension    Hip abduction    Hip adduction    Hip internal rotation    Hip external rotation    Knee flexion 5 5  Knee extension 5 5  Ankle dorsiflexion 5 5  Ankle plantarflexion    Ankle inversion    Ankle eversion     (Blank rows = not tested)   CERVICAL SPECIAL TESTS:  Upper limb tension test (ULTT): Positive Median nerve (+) in RUE Ulnar nerve and radial nerve (-) BUE   LUMBAR SPECIAL TESTS:  Slump test: Positive in LLE only   TODAY'S TREATMENT:         Ther Act Provided pt w/aquatic therapy handout informing pt of what to expect and how to prepare for aquatic session and map of where to go. Pt verbalized understanding.   Ther Ex  SciFit multi-peaks level 4 for 8 minutes using BUE/BLEs for dynamic cardiovascular warmup and global strength. RPE of 7/10 following activity  Established initial HEP (see bolded below) for improved spinal mobility, posterior chain strength, pain modulation and nerve gliding:  Supine LTRs, x10 per side. Pt reporting "pain in my butt"  w/activity, but tolerable  Supine glute bridges w/2-3s isometric hold and 3s eccentric lower, x12 reps.  Supine sciatic nerve glide on LLE, x10 reps.  Seated figure-4  piriformis stretch, x90s per side. Decreased mobility on R side > L side.     PATIENT EDUCATION:  Education details: Initial HEP, aquatic therapy handouts  Person educated: Patient Education method: Explanation, Demonstration, and Handouts Education comprehension: verbalized understanding and returned demonstration  HOME EXERCISE PROGRAM: Access Code: [email or web address] URL: [email or web address] Date: 10/24/2022 Prepared by: Alethia Berthold Alizza Sacra  Exercises - Lower Trunk Rotation Stretch  - 1 x daily - 7 x weekly - 3 sets - 10 reps - Supine Bridge  - 1 x daily - 7 x weekly - 3 sets - 10 reps - 2-3 second hold - Seated Piriformis Stretch with Trunk Bend  - 1 x daily - 7 x weekly - 3 sets - 1-2 minute hold - Supine Sciatic Nerve Glide  - 1 x daily - 7 x weekly - 3 sets - 10 reps  ASSESSMENT:  CLINICAL IMPRESSION: Emphasis of skilled PT session on providing information on aquatic therapy and establishing initial HEP. Pt tolerated session well w/no increase in pain. Pt demonstrates increased limitations in R hip/hamstring mobility and poor posterior chain strength. Will add to HEP next session for improved shoulder and cervical ROM. Continue POC.    OBJECTIVE IMPAIRMENTS: decreased activity tolerance, decreased mobility, decreased ROM, decreased strength, increased fascial restrictions, impaired perceived functional ability, impaired flexibility, impaired sensation, impaired UE functional use, improper body mechanics, postural dysfunction, and pain.   ACTIVITY LIMITATIONS: carrying, lifting, bending, sitting, standing, squatting, stairs, transfers, and bed mobility  PARTICIPATION LIMITATIONS: meal prep, cleaning, laundry, driving, shopping, and community activity  PERSONAL FACTORS: Time since onset of injury/illness/exacerbation and 3+ comorbidities:    Large B cell lymphoma, chronic pain (low back, BLE, neck, knees, SI joint, shoulders, hips), RTC tendinitis, fibromyalgia, carpal  tunnelare also affecting patient's functional outcome.   REHAB POTENTIAL: Fair time since onset, history of chronic pain with little relief from pain clinic  CLINICAL DECISION MAKING: Stable/uncomplicated  EVALUATION COMPLEXITY: Low   GOALS: Goals reviewed with patient? Yes  SHORT TERM GOALS: Target date: 11/10/2022  Pt will be independent with initial HEP for improved cervical, shoulder, and lumbar ROM, improved posture and management of pain symptoms in order to build upon functional gains made in therapy. Baseline: Goal status: INITIAL  2.  Pt will increase her cervical ROM in limited motions by >/= 5 degrees from initial assessment to demonstrate improved function. Baseline: see eval Goal status: INITIAL  3.  Pt will increase her shoulder ROM in limited motions by >/= 5 degrees from initial assessment to demonstrate improved function. Baseline: see eval Goal status: INITIAL   LONG TERM GOALS: Target date: 12/08/2022  Pt will be independent with final HEP for improved cervical, shoulder, and lumbar ROM, improved posture and management of pain symptoms in order to build upon functional gains made in therapy. Baseline:  Goal status: INITIAL  2.  Pt will increase her cervical ROM in limited motions by >/= 10 degrees from initial assessment to demonstrate improved function. Baseline: see eval Goal status: INITIAL  3.  Pt will increase her shoulder ROM in limited motions by >/= 10 degrees from initial assessment to demonstrate improved function. Baseline: see eval Goal status: INITIAL  4.  Pt will improve her score on the modified Oswestry to </=20/50 to demonstrate decreased disability level. Baseline: 25/50 (10/4) Goal status: INITIAL  5.  Pt will improve her score on the NDI to </= 30/50 to demonstrate decreased disability level. Baseline: 35/50 (10/4) Goal status: INITIAL    PLAN:  PT FREQUENCY: 2x/week  PT DURATION: 8 weeks  PLANNED INTERVENTIONS:  Therapeutic exercises, Therapeutic activity, Neuromuscular re-education, Balance training, Gait training, Patient/Family education, Self Care, Joint mobilization, Stair training, DME instructions, Aquatic Therapy, Dry Needling, Electrical stimulation, Spinal manipulation, Spinal mobilization, Cryotherapy, Moist heat, Taping, Traction, Manual therapy, and Re-evaluation.  PLAN FOR NEXT SESSION: [place]: Add to HEP to address cervical ROM and strengthening, B shoulder ROM and strength, low back pain, and sciatica, TPDN?  Aquatic: first appt 10/24 with Charletta Cousin Delinda Malan, PT, DPT [facility] [address] [place], [place]  [address] Phone:  [phone] Fax:  [phone]   10/24/2022, 10:12 AM

## 2022-10-26 ENCOUNTER — Ambulatory Visit: Payer: 59 | Admitting: Physical Therapy

## 2022-10-26 DIAGNOSIS — M542 Cervicalgia: Secondary | ICD-10-CM | POA: Diagnosis not present

## 2022-10-26 DIAGNOSIS — M25511 Pain in right shoulder: Secondary | ICD-10-CM | POA: Diagnosis not present

## 2022-10-26 DIAGNOSIS — M5459 Other low back pain: Secondary | ICD-10-CM | POA: Diagnosis not present

## 2022-10-26 DIAGNOSIS — M6281 Muscle weakness (generalized): Secondary | ICD-10-CM

## 2022-10-26 DIAGNOSIS — G8929 Other chronic pain: Secondary | ICD-10-CM | POA: Diagnosis not present

## 2022-10-26 DIAGNOSIS — M5416 Radiculopathy, lumbar region: Secondary | ICD-10-CM | POA: Diagnosis not present

## 2022-10-26 DIAGNOSIS — M797 Fibromyalgia: Secondary | ICD-10-CM | POA: Diagnosis not present

## 2022-10-26 DIAGNOSIS — M25512 Pain in left shoulder: Secondary | ICD-10-CM | POA: Diagnosis not present

## 2022-10-26 DIAGNOSIS — M5412 Radiculopathy, cervical region: Secondary | ICD-10-CM | POA: Diagnosis not present

## 2022-10-26 NOTE — Therapy (Signed)
OUTPATIENT PHYSICAL THERAPY CERVICAL AND THORACOLUMBAR TREATMENT   Patient Name: Rachael West MRN: [id number] DOB:09-26-1955, 67 y.o., female Today's Date: 10/26/2022  END OF SESSION:  PT End of Session - 10/26/22 0805     Visit Number 3    Number of Visits 17   with eval   Date for PT Re-Evaluation 01/05/23    Authorization Type [employer] Medicare    PT Start Time 0802    PT Stop Time 0843    PT Time Calculation (min) 41 min    Activity Tolerance Patient tolerated treatment well    Behavior During Therapy [facility] for tasks assessed/performed               Past Medical History:  Diagnosis Date   Anemia    Anxiety    Arthritis    Asthma    Depression    GERD (gastroesophageal reflux disease)    Hepatitis-C    History of chemotherapy    Hypertension    Insomnia    Lymphoma (HCC) 12/2013   aggressive lymphoma   Personal history of non-Hodgkin lymphomas    Vitamin D deficiency    Past Surgical History:  Procedure Laterality Date   ABDOMINAL HYSTERECTOMY     APPENDECTOMY     BREAST BIOPSY Left 2015   lymphoma   COLONOSCOPY  01/17/2011   Procedure: COLONOSCOPY;  Surgeon: Rachael West., MD;  Location: [facility];  Service: Endoscopy;  Laterality: N/A;   ESOPHAGOGASTRODUODENOSCOPY  01/17/2011   Procedure: ESOPHAGOGASTRODUODENOSCOPY (EGD);  Surgeon: Rachael West., MD;  Location: [facility];  Service: Endoscopy;  Laterality: N/A;   TRIGGER FINGER RELEASE     Patient Active Problem List   Diagnosis Date Noted   Abnormal MRI, lumbar spine (05/17/2022) 06/15/2022   Lumbosacral sensory radiculopathy at L5 (Left) 05/08/2022   Seasonal allergies 03/06/2022   Osteoarthritis of glenohumeral joint (Right) 12/08/2021    Class: Chronic   Primary osteoarthritis of shoulder (Right) 12/08/2021    Class: Chronic   Rotator cuff arthropathy of shoulder (Right) 12/08/2021    Class: Chronic   Tendinopathy of rotator cuff (Right) 12/08/2021    Class: Chronic    Carpal tunnel syndrome (Bilateral) 11/30/2021   Chronic shoulder pain (Left) 11/30/2021    Class: Chronic   Fall (11/25/21) 11/30/2021   Incomplete emptying of bladder 10/01/2021   Increased frequency of urination 10/01/2021   Esophageal varices (HCC) 07/19/2021   Fasting hypoglycemia 07/19/2021   Trigger finger, middle finger (Right) 09/23/2020   Trigger point of shoulder region (Right) 09/23/2020   Chronic use of opiate for therapeutic purpose 07/26/2020   Abnormal MRI, cervical spine (11/12/2019) 04/15/2020   Chronic neuropathic pain 04/15/2020   Disturbance of skin sensation 04/15/2020   Hyperalgesia 04/15/2020   Musculoskeletal disorder involving upper trapezius muscle (Right) 04/07/2020   Arthralgia of shoulder region (Right) 11/27/2019   Shoulder enthesopathy (Right) 11/27/2019   Chronic shoulder pain (Right) 11/13/2019   Cervical disc disorder at C5-C6 level with radiculopathy 08/07/2019   Cervical radiculopathy at C6 08/07/2019   Osteoarthrosis of multiple sites 08/07/2019   Radicular pain of upper extremity (Right) 08/07/2019   Chronic upper extremity pain (Right) 08/07/2019   Chronic low back pain (1ry area of Pain) (Bilateral) (L>R) w/o sciatica 09/30/2018   Chronic musculoskeletal pain 07/31/2018   Therapeutic opioid-induced constipation (OIC) 07/31/2018   Gait disturbance 11/28/2017   Cervical facet syndrome (Bilateral) (R>L) 10/24/2017   Spondylosis without myelopathy or radiculopathy, cervical region 10/24/2017  Latex precautions, history of latex allergy 10/09/2017   Chronic idiopathic gout 09/27/2017   DDD (degenerative disc disease), cervical 09/27/2017   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) 09/27/2017   Chronic shoulder pain (Bilateral) (R>L) 09/26/2017   Chronic hip pain (Bilateral) (L>R) 09/26/2017   Cervicalgia 09/26/2017   DDD (degenerative disc disease), lumbar 09/25/2017   Lumbar facet arthropathy 09/25/2017   Lumbar facet syndrome  (Bilateral) (L>R) 09/25/2017   Spondylosis without myelopathy or radiculopathy, lumbar region 09/25/2017   DDD (degenerative disc disease), thoracic 09/25/2017   Thoracolumbar dextroscoliosis 09/25/2017   Vitamin D deficiency 09/25/2017   Cervicogenic headache 09/25/2017   Other dysphagia 09/24/2017   Tendinitis of rotator cuff (Left) 09/24/2017   Tendinitis of rotator cuff (Right) 09/24/2017   Fibromyalgia 09/24/2017   Chronic low back pain (1ry area of Pain) (Bilateral) w/ sciatica (Left) 08/29/2017   Chronic lower extremity pain (2ry area of Pain) (Left) 08/29/2017   Chronic upper back pain (3ry area of Pain) (Bilateral) (L>R) 08/29/2017   Chronic neck pain (4th area of Pain) (Bilateral) (L>R) 08/29/2017   Occipital headache 08/29/2017   Chronic knee pain (Bilateral) (R>L) 08/29/2017   Chronic pain syndrome 08/29/2017   Long term current use of opiate analgesic 08/29/2017   Chronic sacroiliac joint pain 08/29/2017   Hyperlipidemia 07/31/2017   GERD (gastroesophageal reflux disease) 05/01/2017   Anxiety 05/01/2017   Depression 05/01/2017   Obesity (BMI 30-39.9) 09/12/2014   Cirrhosis (HCC) 09/08/2014   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   Chronic idiopathic constipation 04/24/2012   Chronic hepatitis C without hepatic coma (HCC) 05/25/2011   Intermittent asthma 05/25/2011   Essential hypertension 05/25/2011   Chronic gastritis 05/25/2011   Stress incontinence 02/10/2011   Urge incontinence 02/10/2011    PCP: Rachael Quitter, PA  REFERRING PROVIDER: Delano Metz, MD  REFERRING DIAG: M54.2, G89.29  - chronic neck pain  Rationale for Evaluation and Treatment: Rehabilitation  THERAPY DIAG:  Muscle weakness (generalized)  Cervicalgia  Other low back pain  ONSET DATE: 07/20/2022 (referral date)  SUBJECTIVE:                                                                                                                                                                                            SUBJECTIVE STATEMENT: Pt presents w/walking stick. Reports 3/10 pain today. Denies falls or acute changes. Thinks her HEP is helping   PERTINENT HISTORY:  Per pain clinic note from Dr. Laban West:  Rachael West has Large B-cell lymphoma (HCC); Chronic low back pain (1ry area of Pain) (Bilateral) w/ sciatica (Left); Chronic lower extremity pain (2ry area of Pain) (Left);  Chronic upper back pain (3ry area of Pain) (Bilateral) (L>R); Chronic neck pain (4th area of Pain) (Bilateral) (L>R); Occipital headache; Chronic knee pain (Bilateral) (R>L); Chronic pain syndrome; Chronic sacroiliac joint pain; DDD (degenerative disc disease), lumbar; Lumbar facet arthropathy; Lumbar facet syndrome (Bilateral) (L>R); Spondylosis without myelopathy or radiculopathy, lumbar region; DDD (degenerative disc disease), thoracic; Thoracolumbar dextroscoliosis; Cervicogenic headache; Tendinitis of rotator cuff (Left); Tendinitis of rotator cuff (Right); Fibromyalgia; Chronic shoulder pain (Bilateral) (R>L); Chronic hip pain (Bilateral) (L>R); Cervicalgia; Chronic idiopathic gout; DDD (degenerative disc disease), cervical; 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); Cervical facet syndrome (Bilateral) (R>L); Spondylosis without myelopathy or radiculopathy, cervical region; Chronic musculoskeletal pain; Chronic low back pain (1ry area of Pain) (Bilateral) (L>R) w/o sciatica; Cervical disc disorder at C5-C6 level with radiculopathy; Cervical radiculopathy at C6; Osteoarthrosis of multiple sites; Radicular pain of upper extremity (Right); Chronic upper extremity pain (Right); Chronic shoulder pain (Right); Arthralgia of shoulder region (Right); Shoulder enthesopathy (Right); Musculoskeletal disorder involving upper trapezius muscle (Right); Abnormal MRI, cervical spine (11/12/2019); Chronic neuropathic pain; Disturbance of skin sensation; Hyperalgesia; Trigger finger, middle finger (Right);  Trigger point of shoulder region (Right); Carpal tunnel syndrome (Bilateral); Chronic shoulder pain (Left); Osteoarthritis of glenohumeral joint (Right); Primary osteoarthritis of shoulder (Right); Rotator cuff arthropathy of shoulder (Right); Tendinopathy of rotator cuff (Right); Lumbosacral sensory radiculopathy at L5 (Left); and Abnormal MRI, lumbar spine (05/17/2022) on their pertinent problem list.  PAIN:  Are you having pain? Yes: NPRS scale: 3/10 Pain location: neck, carpal tunnel in arms Pain description: dull ache Aggravating factors: turning side to side Relieving factors: not moving, heating pad, TENS unit   Are you having pain? Yes: NPRS scale: 3/10 Pain location: back and into B buttocks and down into legs Pain description: dull ache constantly Aggravating factors: sweeping, standing for longer periods of time Relieving factors: sitting down, heating pad, TENS unit  PRECAUTIONS: None  RED FLAGS: None   WEIGHT BEARING RESTRICTIONS: No  FALLS:  Has patient fallen in last 6 months? No  LIVING ENVIRONMENT: Lives with: lives with their family Lives in: House/apartment Stairs: No Has following equipment at home: Single point cane  OCCUPATION: retired  PLOF: Independent with gait, Independent with transfers, and Requires assistive device for independence  PATIENT GOALS: "more mobility"  NEXT MD VISIT: sees Dr. Laban West in December (pain management), no scheduled follow up with Dr. Ethelene Hal (ortho)  OBJECTIVE:  Note: Objective measures were completed at Evaluation unless otherwise noted.  DIAGNOSTIC FINDINGS:  Lumbar spine MRI  05/17/2022 IMPRESSION: 1. L2-3: Disc degeneration with bulging of the disc more prominent towards the left. Mild foraminal encroachment, left more than right. Some potential the left L2 nerve could be affected. 2. L4-5: Disc bulge. Facet degeneration and hypertrophy. Mild narrowing of both lateral recesses but no visible neural compression.  The facet arthritis could contribute to back pain or referred facet syndrome pain. 3. Lesser, non-compressive degenerative changes at L3-4 and L5-S1  Cervical spine MRI 11/12/2019 (most recent imaging in chart) IMPRESSION: 1. Multilevel disc and facet degeneration as described. 2. Right foraminal impingement at C3-4, C5-6, and C6-7. 3. Left foraminal impingement at C5-6 and C6-7. 4. Diffusely patent spinal canal.  PATIENT SURVEYS:  Modified Oswestry 25/50  NDI 35/50  SCREENING FOR RED FLAGS: Bowel or bladder incontinence: No Spinal tumors: No Cauda equina syndrome: No Compression fracture: No Abdominal aneurysm: No  COGNITION: Overall cognitive status: Within functional limits for tasks assessed     SENSATION: Heaviness on R side (UE) [facility]  in BLE    POSTURE: rounded shoulders and forward head   CERVICAL ROM:   Active ROM AROM (deg) eval  Flexion 25* (pain in R shoulder)  Extension 18* (pain in R shoulder)  Right lateral flexion 20*  Left lateral flexion 20* (Pain in L side of neck)   Right rotation 30* (pain in base of neck)  Left rotation 26* (pain in base of neck)   (Blank rows = not tested)   UPPER EXTREMITY ROM:  Active ROM Right eval Left eval  Shoulder flexion 140*, pain in shoulders and mid-back (3/10) 155*, pain in shoulders and mid-back (3/10)  Shoulder extension    Shoulder abduction WFL, pain R>L shoulder WFL, pain R>L shoulder  Shoulder adduction    Shoulder extension    Shoulder internal rotation 30* 20*  Shoulder external rotation Close to 90* painful Close to 90* Pain across anterior joint line  Elbow flexion    Elbow extension    Wrist flexion    Wrist extension    Wrist ulnar deviation    Wrist radial deviation    Wrist pronation    Wrist supination     (Blank rows = not tested)   UPPER EXTREMITY MMT:  MMT Right eval Left eval  Shoulder flexion pain 4  Shoulder extension    Shoulder abduction pain pain  Shoulder adduction     Shoulder extension    Shoulder internal rotation    Shoulder external rotation    Middle trapezius    Lower trapezius    Elbow flexion 5 5  Elbow extension 5 5  Wrist flexion    Wrist extension    Wrist ulnar deviation    Wrist radial deviation    Wrist pronation    Wrist supination    Grip strength WFL WFL   (Blank rows = not tested)    LUMBAR ROM:   AROM eval  Flexion WFL, increase in low back pain  Extension WFL, increase in low back pain  Right lateral flexion 75%, neck pain  Left lateral flexion 75%, neck pain  Right rotation 75%, no pain  Left rotation 50%, no pain   (Blank rows = not tested)   LOWER EXTREMITY MMT:    MMT Right eval Left eval  Hip flexion Pain in hip/upper thigh that goes down into knee  4+  Hip extension    Hip abduction    Hip adduction    Hip internal rotation    Hip external rotation    Knee flexion 5 5  Knee extension 5 5  Ankle dorsiflexion 5 5  Ankle plantarflexion    Ankle inversion    Ankle eversion     (Blank rows = not tested)   CERVICAL SPECIAL TESTS:  Upper limb tension test (ULTT): Positive Median nerve (+) in RUE Ulnar nerve and radial nerve (-) BUE   LUMBAR SPECIAL TESTS:  Slump test: Positive in LLE only   TODAY'S TREATMENT:       Ther Ex  SciFit multi-peaks level 5 for 8 minutes using BUE/BLEs for dynamic cardiovascular warmup and global strength. RPE of 6/10 following activity  The following exercises were performed for improved spinal mobility, UE strength and pain modulation:  Supine iron crosses, x7 per side. Pt extremely limited w/thoracic spine rotation and unable to perform full ROM, more limited on L side > R side Sidelying open books, x12 per side. Added to HEP (see bolded below)  Quadruped thread the needles, x8 per side. Added to  HEP (see bolded below)  Cat cows, x12 reps. Added to HEP (See bolded below)  Pt rated pain as 4/10 in low back following session despite reporting feeling better      PATIENT EDUCATION:  Education details: Additions to HEP Person educated: Patient Education method: Explanation, Demonstration, and Handouts Education comprehension: verbalized understanding and returned demonstration  HOME EXERCISE PROGRAM: Access Code: [email or web address] URL: [email or web address] Date: 10/24/2022 Prepared by: Alethia Berthold Randy Castrejon  Exercises - Lower Trunk Rotation Stretch  - 1 x daily - 7 x weekly - 3 sets - 10 reps - Supine Bridge  - 1 x daily - 7 x weekly - 3 sets - 10 reps - 2-3 second hold - Seated Piriformis Stretch with Trunk Bend  - 1 x daily - 7 x weekly - 3 sets - 1-2 minute hold - Supine Sciatic Nerve Glide  - 1 x daily - 7 x weekly - 3 sets - 10 reps - Sidelying Thoracic Rotation with Open Book  - 1 x daily - 7 x weekly - 3 sets - 10 reps - Quadruped Full Range Thoracic Rotation with Reach  - 1 x daily - 7 x weekly - 3 sets - 10 reps - Cat Cow  - 1 x daily - 7 x weekly - 3 sets - 10 reps  ASSESSMENT:  CLINICAL IMPRESSION: Emphasis of skilled PT session on improved spinal mobility and pain modulation. Pt tolerated session well and reported feeling better after session despite reporting increase in pain. Pt demonstrates increased limitation in spinal mobility to L side but did improve following exercise. Continue POC.    OBJECTIVE IMPAIRMENTS: decreased activity tolerance, decreased mobility, decreased ROM, decreased strength, increased fascial restrictions, impaired perceived functional ability, impaired flexibility, impaired sensation, impaired UE functional use, improper body mechanics, postural dysfunction, and pain.   ACTIVITY LIMITATIONS: carrying, lifting, bending, sitting, standing, squatting, stairs, transfers, and bed mobility  PARTICIPATION LIMITATIONS: meal prep, cleaning, laundry, driving, shopping, and community activity  PERSONAL FACTORS: Time since onset of injury/illness/exacerbation and 3+ comorbidities:    Large B cell lymphoma,  chronic pain (low back, BLE, neck, knees, SI joint, shoulders, hips), RTC tendinitis, fibromyalgia, carpal tunnelare also affecting patient's functional outcome.   REHAB POTENTIAL: Fair time since onset, history of chronic pain with little relief from pain clinic  CLINICAL DECISION MAKING: Stable/uncomplicated  EVALUATION COMPLEXITY: Low   GOALS: Goals reviewed with patient? Yes  SHORT TERM GOALS: Target date: 11/10/2022  Pt will be independent with initial HEP for improved cervical, shoulder, and lumbar ROM, improved posture and management of pain symptoms in order to build upon functional gains made in therapy. Baseline: Goal status: INITIAL  2.  Pt will increase her cervical ROM in limited motions by >/= 5 degrees from initial assessment to demonstrate improved function. Baseline: see eval Goal status: INITIAL  3.  Pt will increase her shoulder ROM in limited motions by >/= 5 degrees from initial assessment to demonstrate improved function. Baseline: see eval Goal status: INITIAL   LONG TERM GOALS: Target date: 12/08/2022  Pt will be independent with final HEP for improved cervical, shoulder, and lumbar ROM, improved posture and management of pain symptoms in order to build upon functional gains made in therapy. Baseline:  Goal status: INITIAL  2.  Pt will increase her cervical ROM in limited motions by >/= 10 degrees from initial assessment to demonstrate improved function. Baseline: see eval Goal status: INITIAL  3.  Pt will increase her shoulder ROM in limited motions by >/=  10 degrees from initial assessment to demonstrate improved function. Baseline: see eval Goal status: INITIAL  4.  Pt will improve her score on the modified Oswestry to </=20/50 to demonstrate decreased disability level. Baseline: 25/50 (10/4) Goal status: INITIAL  5.  Pt will improve her score on the NDI to </= 30/50 to demonstrate decreased disability level. Baseline: 35/50 (10/4) Goal status:  INITIAL    PLAN:  PT FREQUENCY: 2x/week  PT DURATION: 8 weeks  PLANNED INTERVENTIONS: Therapeutic exercises, Therapeutic activity, Neuromuscular re-education, Balance training, Gait training, Patient/Family education, Self Care, Joint mobilization, Stair training, DME instructions, Aquatic Therapy, Dry Needling, Electrical stimulation, Spinal manipulation, Spinal mobilization, Cryotherapy, Moist heat, Taping, Traction, Manual therapy, and Re-evaluation.  PLAN FOR NEXT SESSION: [place]: Add to HEP to address cervical ROM and strengthening, B shoulder ROM and strength, low back pain, and sciatica, TPDN? I, Y, T, shoulder and thoracic mobility   Aquatic: first appt 10/24 with Charletta Cousin Alphia Behanna, PT, DPT [facility] [address] [place], [place]  [address] Phone:  [phone] Fax:  [phone]   10/26/2022, 8:45 AM

## 2022-10-30 ENCOUNTER — Other Ambulatory Visit: Payer: 59

## 2022-10-30 DIAGNOSIS — E785 Hyperlipidemia, unspecified: Secondary | ICD-10-CM

## 2022-10-30 DIAGNOSIS — I10 Essential (primary) hypertension: Secondary | ICD-10-CM | POA: Diagnosis not present

## 2022-10-31 ENCOUNTER — Ambulatory Visit: Payer: 59 | Admitting: Physical Therapy

## 2022-10-31 ENCOUNTER — Encounter: Payer: Self-pay | Admitting: Physical Therapy

## 2022-10-31 DIAGNOSIS — G8929 Other chronic pain: Secondary | ICD-10-CM

## 2022-10-31 DIAGNOSIS — M5412 Radiculopathy, cervical region: Secondary | ICD-10-CM

## 2022-10-31 DIAGNOSIS — M5459 Other low back pain: Secondary | ICD-10-CM

## 2022-10-31 DIAGNOSIS — M797 Fibromyalgia: Secondary | ICD-10-CM

## 2022-10-31 DIAGNOSIS — M6281 Muscle weakness (generalized): Secondary | ICD-10-CM

## 2022-10-31 DIAGNOSIS — M542 Cervicalgia: Secondary | ICD-10-CM

## 2022-10-31 DIAGNOSIS — M5416 Radiculopathy, lumbar region: Secondary | ICD-10-CM | POA: Diagnosis not present

## 2022-10-31 DIAGNOSIS — M25511 Pain in right shoulder: Secondary | ICD-10-CM | POA: Diagnosis not present

## 2022-10-31 DIAGNOSIS — M25512 Pain in left shoulder: Secondary | ICD-10-CM | POA: Diagnosis not present

## 2022-10-31 LAB — COMPREHENSIVE METABOLIC PANEL
ALT: 14 [IU]/L (ref 0–32)
AST: 19 [IU]/L (ref 0–40)
Albumin: 3.9 g/dL (ref 3.9–4.9)
Alkaline Phosphatase: 107 [IU]/L (ref 44–121)
BUN/Creatinine Ratio: 8 — ABNORMAL LOW (ref 12–28)
BUN: 6 mg/dL — ABNORMAL LOW (ref 8–27)
Bilirubin Total: <0.2 mg/dL (ref 0.0–1.2)
CO2: 25 mmol/L (ref 20–29)
Calcium: 9.1 mg/dL (ref 8.7–10.3)
Chloride: 100 mmol/L (ref 96–106)
Creatinine, Ser: 0.8 mg/dL (ref 0.57–1.00)
Globulin, Total: 2.9 g/dL (ref 1.5–4.5)
Glucose: 79 mg/dL (ref 70–99)
Potassium: 4.7 mmol/L (ref 3.5–5.2)
Sodium: 138 mmol/L (ref 134–144)
Total Protein: 6.8 g/dL (ref 6.0–8.5)
eGFR: 80 mL/min/{1.73_m2} (ref >59)

## 2022-10-31 LAB — CBC WITH DIFFERENTIAL/PLATELET
Basophils Absolute: 0.1 10*3/uL (ref 0.0–0.2)
Basos: 1 %
EOS (ABSOLUTE): 0.1 10*3/uL (ref 0.0–0.4)
Eos: 3 %
Hematocrit: 35.2 % (ref 34.0–46.6)
Hemoglobin: 11 g/dL — ABNORMAL LOW (ref 11.1–15.9)
Immature Grans (Abs): 0 10*3/uL (ref 0.0–0.1)
Immature Granulocytes: 0 %
Lymphocytes Absolute: 1.8 10*3/uL (ref 0.7–3.1)
Lymphs: 40 %
MCH: 24.7 pg — ABNORMAL LOW (ref 26.6–33.0)
MCHC: 31.3 g/dL — ABNORMAL LOW (ref 31.5–35.7)
MCV: 79 fL (ref 79–97)
Monocytes Absolute: 0.4 10*3/uL (ref 0.1–0.9)
Monocytes: 8 %
Neutrophils Absolute: 2.2 10*3/uL (ref 1.4–7.0)
Neutrophils: 48 %
Platelets: 247 10*3/uL (ref 150–450)
RBC: 4.46 x10E6/uL (ref 3.77–5.28)
RDW: 13.4 % (ref 11.7–15.4)
WBC: 4.5 10*3/uL (ref 3.4–10.8)

## 2022-10-31 LAB — LIPID PANEL
Chol/HDL Ratio: 4.9 ratio — ABNORMAL HIGH (ref 0.0–4.4)
Cholesterol, Total: 202 mg/dL — ABNORMAL HIGH (ref 100–199)
HDL: 41 mg/dL (ref >39)
LDL Chol Calc (NIH): 141 mg/dL — ABNORMAL HIGH (ref 0–99)
Triglycerides: 108 mg/dL (ref 0–149)
VLDL Cholesterol Cal: 20 mg/dL (ref 5–40)

## 2022-10-31 NOTE — Therapy (Signed)
OUTPATIENT PHYSICAL THERAPY CERVICAL AND THORACOLUMBAR TREATMENT   Patient Name: Rachael West MRN: [id number] DOB:1955/06/08, 67 y.o., female Today's Date: 10/31/2022  END OF SESSION:  PT End of Session - 10/31/22 0845     Visit Number 4    Number of Visits 17   with eval   Date for PT Re-Evaluation 01/05/23    Authorization Type [employer] Medicare    PT Start Time 0843    PT Stop Time 0910    PT Time Calculation (min) 27 min    Activity Tolerance Patient tolerated treatment well    Behavior During Therapy [facility] for tasks assessed/performed               Past Medical History:  Diagnosis Date   Anemia    Anxiety    Arthritis    Asthma    Depression    GERD (gastroesophageal reflux disease)    Hepatitis-C    History of chemotherapy    Hypertension    Insomnia    Lymphoma (HCC) 12/2013   aggressive lymphoma   Personal history of non-Hodgkin lymphomas    Vitamin D deficiency    Past Surgical History:  Procedure Laterality Date   ABDOMINAL HYSTERECTOMY     APPENDECTOMY     BREAST BIOPSY Left 2015   lymphoma   COLONOSCOPY  01/17/2011   Procedure: COLONOSCOPY;  Surgeon: Vertell Novak., MD;  Location: [facility];  Service: Endoscopy;  Laterality: N/A;   ESOPHAGOGASTRODUODENOSCOPY  01/17/2011   Procedure: ESOPHAGOGASTRODUODENOSCOPY (EGD);  Surgeon: Vertell Novak., MD;  Location: [facility];  Service: Endoscopy;  Laterality: N/A;   TRIGGER FINGER RELEASE     Patient Active Problem List   Diagnosis Date Noted   Abnormal MRI, lumbar spine (05/17/2022) 06/15/2022   Lumbosacral sensory radiculopathy at L5 (Left) 05/08/2022   Seasonal allergies 03/06/2022   Osteoarthritis of glenohumeral joint (Right) 12/08/2021    Class: Chronic   Primary osteoarthritis of shoulder (Right) 12/08/2021    Class: Chronic   Rotator cuff arthropathy of shoulder (Right) 12/08/2021    Class: Chronic   Tendinopathy of rotator cuff (Right) 12/08/2021    Class: Chronic    Carpal tunnel syndrome (Bilateral) 11/30/2021   Chronic shoulder pain (Left) 11/30/2021    Class: Chronic   Fall (11/25/21) 11/30/2021   Incomplete emptying of bladder 10/01/2021   Increased frequency of urination 10/01/2021   Esophageal varices (HCC) 07/19/2021   Fasting hypoglycemia 07/19/2021   Trigger finger, middle finger (Right) 09/23/2020   Trigger point of shoulder region (Right) 09/23/2020   Chronic use of opiate for therapeutic purpose 07/26/2020   Abnormal MRI, cervical spine (11/12/2019) 04/15/2020   Chronic neuropathic pain 04/15/2020   Disturbance of skin sensation 04/15/2020   Hyperalgesia 04/15/2020   Musculoskeletal disorder involving upper trapezius muscle (Right) 04/07/2020   Arthralgia of shoulder region (Right) 11/27/2019   Shoulder enthesopathy (Right) 11/27/2019   Chronic shoulder pain (Right) 11/13/2019   Cervical disc disorder at C5-C6 level with radiculopathy 08/07/2019   Cervical radiculopathy at C6 08/07/2019   Osteoarthrosis of multiple sites 08/07/2019   Radicular pain of upper extremity (Right) 08/07/2019   Chronic upper extremity pain (Right) 08/07/2019   Chronic low back pain (1ry area of Pain) (Bilateral) (L>R) w/o sciatica 09/30/2018   Chronic musculoskeletal pain 07/31/2018   Therapeutic opioid-induced constipation (OIC) 07/31/2018   Gait disturbance 11/28/2017   Cervical facet syndrome (Bilateral) (R>L) 10/24/2017   Spondylosis without myelopathy or radiculopathy, cervical region 10/24/2017  Latex precautions, history of latex allergy 10/09/2017   Chronic idiopathic gout 09/27/2017   DDD (degenerative disc disease), cervical 09/27/2017   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) 09/27/2017   Chronic shoulder pain (Bilateral) (R>L) 09/26/2017   Chronic hip pain (Bilateral) (L>R) 09/26/2017   Cervicalgia 09/26/2017   DDD (degenerative disc disease), lumbar 09/25/2017   Lumbar facet arthropathy 09/25/2017   Lumbar facet syndrome  (Bilateral) (L>R) 09/25/2017   Spondylosis without myelopathy or radiculopathy, lumbar region 09/25/2017   DDD (degenerative disc disease), thoracic 09/25/2017   Thoracolumbar dextroscoliosis 09/25/2017   Vitamin D deficiency 09/25/2017   Cervicogenic headache 09/25/2017   Other dysphagia 09/24/2017   Tendinitis of rotator cuff (Left) 09/24/2017   Tendinitis of rotator cuff (Right) 09/24/2017   Fibromyalgia 09/24/2017   Chronic low back pain (1ry area of Pain) (Bilateral) w/ sciatica (Left) 08/29/2017   Chronic lower extremity pain (2ry area of Pain) (Left) 08/29/2017   Chronic upper back pain (3ry area of Pain) (Bilateral) (L>R) 08/29/2017   Chronic neck pain (4th area of Pain) (Bilateral) (L>R) 08/29/2017   Occipital headache 08/29/2017   Chronic knee pain (Bilateral) (R>L) 08/29/2017   Chronic pain syndrome 08/29/2017   Long term current use of opiate analgesic 08/29/2017   Chronic sacroiliac joint pain 08/29/2017   Hyperlipidemia 07/31/2017   GERD (gastroesophageal reflux disease) 05/01/2017   Anxiety 05/01/2017   Depression 05/01/2017   Obesity (BMI 30-39.9) 09/12/2014   Cirrhosis (HCC) 09/08/2014   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   Chronic idiopathic constipation 04/24/2012   Chronic hepatitis C without hepatic coma (HCC) 05/25/2011   Intermittent asthma 05/25/2011   Essential hypertension 05/25/2011   Chronic gastritis 05/25/2011   Stress incontinence 02/10/2011   Urge incontinence 02/10/2011    PCP: Melida Quitter, PA  REFERRING PROVIDER: Delano Metz, MD  REFERRING DIAG: M54.2, G89.29  - chronic neck pain  Rationale for Evaluation and Treatment: Rehabilitation  THERAPY DIAG:  Muscle weakness (generalized)  Cervicalgia  Other low back pain  Radiculopathy, cervical region  Chronic pain of both shoulders  Fibromyalgia  Radiculopathy, lumbar region  ONSET DATE: 07/20/2022 (referral date)  SUBJECTIVE:                                                                                                                                                                                            SUBJECTIVE STATEMENT: Pt presents w/walking stick. Reports 3/10 pain today. Denies falls or acute changes. She reports overall she is doing fine.  PERTINENT HISTORY:  Per pain clinic note from Dr. Laban Emperor:  Ms. Dewaele has Large B-cell lymphoma (HCC); Chronic low back pain (  1ry area of Pain) (Bilateral) w/ sciatica (Left); Chronic lower extremity pain (2ry area of Pain) (Left); Chronic upper back pain (3ry area of Pain) (Bilateral) (L>R); Chronic neck pain (4th area of Pain) (Bilateral) (L>R); Occipital headache; Chronic knee pain (Bilateral) (R>L); Chronic pain syndrome; Chronic sacroiliac joint pain; DDD (degenerative disc disease), lumbar; Lumbar facet arthropathy; Lumbar facet syndrome (Bilateral) (L>R); Spondylosis without myelopathy or radiculopathy, lumbar region; DDD (degenerative disc disease), thoracic; Thoracolumbar dextroscoliosis; Cervicogenic headache; Tendinitis of rotator cuff (Left); Tendinitis of rotator cuff (Right); Fibromyalgia; Chronic shoulder pain (Bilateral) (R>L); Chronic hip pain (Bilateral) (L>R); Cervicalgia; Chronic idiopathic gout; DDD (degenerative disc disease), cervical; 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); Cervical facet syndrome (Bilateral) (R>L); Spondylosis without myelopathy or radiculopathy, cervical region; Chronic musculoskeletal pain; Chronic low back pain (1ry area of Pain) (Bilateral) (L>R) w/o sciatica; Cervical disc disorder at C5-C6 level with radiculopathy; Cervical radiculopathy at C6; Osteoarthrosis of multiple sites; Radicular pain of upper extremity (Right); Chronic upper extremity pain (Right); Chronic shoulder pain (Right); Arthralgia of shoulder region (Right); Shoulder enthesopathy (Right); Musculoskeletal disorder involving upper trapezius muscle (Right); Abnormal MRI, cervical  spine (11/12/2019); Chronic neuropathic pain; Disturbance of skin sensation; Hyperalgesia; Trigger finger, middle finger (Right); Trigger point of shoulder region (Right); Carpal tunnel syndrome (Bilateral); Chronic shoulder pain (Left); Osteoarthritis of glenohumeral joint (Right); Primary osteoarthritis of shoulder (Right); Rotator cuff arthropathy of shoulder (Right); Tendinopathy of rotator cuff (Right); Lumbosacral sensory radiculopathy at L5 (Left); and Abnormal MRI, lumbar spine (05/17/2022) on their pertinent problem list.  PAIN:  Are you having pain? Yes: NPRS scale: 3/10 Pain location: neck Pain description: dull ache; jerky Aggravating factors: turning/bending side to side Relieving factors: not moving, heating pad, TENS unit  PRECAUTIONS: None  RED FLAGS: None   WEIGHT BEARING RESTRICTIONS: No  FALLS:  Has patient fallen in last 6 months? No  LIVING ENVIRONMENT: Lives with: lives with their family Lives in: House/apartment Stairs: No Has following equipment at home: Single point cane  OCCUPATION: retired  PLOF: Independent with gait, Independent with transfers, and Requires assistive device for independence  PATIENT GOALS: "more mobility"  NEXT MD VISIT: sees Dr. Laban Emperor in December (pain management), no scheduled follow up with Dr. Ethelene Hal (ortho)  OBJECTIVE:  Note: Objective measures were completed at Evaluation unless otherwise noted.  DIAGNOSTIC FINDINGS:  Lumbar spine MRI  05/17/2022 IMPRESSION: 1. L2-3: Disc degeneration with bulging of the disc more prominent towards the left. Mild foraminal encroachment, left more than right. Some potential the left L2 nerve could be affected. 2. L4-5: Disc bulge. Facet degeneration and hypertrophy. Mild narrowing of both lateral recesses but no visible neural compression. The facet arthritis could contribute to back pain or referred facet syndrome pain. 3. Lesser, non-compressive degenerative changes at L3-4 and  L5-S1  Cervical spine MRI 11/12/2019 (most recent imaging in chart) IMPRESSION: 1. Multilevel disc and facet degeneration as described. 2. Right foraminal impingement at C3-4, C5-6, and C6-7. 3. Left foraminal impingement at C5-6 and C6-7. 4. Diffusely patent spinal canal.  PATIENT SURVEYS:  Modified Oswestry 25/50  NDI 35/50  SCREENING FOR RED FLAGS: Bowel or bladder incontinence: No Spinal tumors: No Cauda equina syndrome: No Compression fracture: No Abdominal aneurysm: No  COGNITION: Overall cognitive status: Within functional limits for tasks assessed     SENSATION: Heaviness on R side (UE) WFL in BLE    POSTURE: rounded shoulders and forward head   CERVICAL ROM:   Active ROM AROM (deg) eval  Flexion 25* (pain in R shoulder)  Extension 18* (pain in R shoulder)  Right lateral flexion 20*  Left lateral flexion 20* (Pain in L side of neck)   Right rotation 30* (pain in base of neck)  Left rotation 26* (pain in base of neck)   (Blank rows = not tested)   UPPER EXTREMITY ROM:  Active ROM Right eval Left eval  Shoulder flexion 140*, pain in shoulders and mid-back (3/10) 155*, pain in shoulders and mid-back (3/10)  Shoulder extension    Shoulder abduction WFL, pain R>L shoulder WFL, pain R>L shoulder  Shoulder adduction    Shoulder extension    Shoulder internal rotation 30* 20*  Shoulder external rotation Close to 90* painful Close to 90* Pain across anterior joint line  Elbow flexion    Elbow extension    Wrist flexion    Wrist extension    Wrist ulnar deviation    Wrist radial deviation    Wrist pronation    Wrist supination     (Blank rows = not tested)   UPPER EXTREMITY MMT:  MMT Right eval Left eval  Shoulder flexion pain 4  Shoulder extension    Shoulder abduction pain pain  Shoulder adduction    Shoulder extension    Shoulder internal rotation    Shoulder external rotation    Middle trapezius    Lower trapezius    Elbow flexion 5 5   Elbow extension 5 5  Wrist flexion    Wrist extension    Wrist ulnar deviation    Wrist radial deviation    Wrist pronation    Wrist supination    Grip strength WFL WFL   (Blank rows = not tested)    LUMBAR ROM:   AROM eval  Flexion WFL, increase in low back pain  Extension WFL, increase in low back pain  Right lateral flexion 75%, neck pain  Left lateral flexion 75%, neck pain  Right rotation 75%, no pain  Left rotation 50%, no pain   (Blank rows = not tested)   LOWER EXTREMITY MMT:    MMT Right eval Left eval  Hip flexion Pain in hip/upper thigh that goes down into knee  4+  Hip extension    Hip abduction    Hip adduction    Hip internal rotation    Hip external rotation    Knee flexion 5 5  Knee extension 5 5  Ankle dorsiflexion 5 5  Ankle plantarflexion    Ankle inversion    Ankle eversion     (Blank rows = not tested)   CERVICAL SPECIAL TESTS:  Upper limb tension test (ULTT): Positive Median nerve (+) in RUE Ulnar nerve and radial nerve (-) BUE   LUMBAR SPECIAL TESTS:  Slump test: Positive in LLE only   TODAY'S TREATMENT:       Ther Ex  SciFit progressed from level 2.0 to level 6.0 over 8 minutes using BUE/BLEs for dynamic cardiovascular warmup and global strength. RPE of 7/10 following activity. Supine active cervical rotation x20 bilaterally, cued to progress ROM and prevent compensatory neck motion Supine LTRs x20 Bilateral shoulder flexion to 120 degrees with 2lb dowel rod x15 Side-lying ER w/ 2lb dumbbell x15 each side; pt sits up abruptly during left set stating her ride is here and she needs to leave.  PATIENT EDUCATION:  Education details: Continue HEP. Person educated: Patient Education method: Explanation, Demonstration, and Handouts Education comprehension: verbalized understanding and returned demonstration  HOME EXERCISE PROGRAM: Access Code: [email or web address] URL: [email or web address] Date: 10/24/2022 Prepared  by:  Alethia Berthold Plaster  Exercises - Lower Trunk Rotation Stretch  - 1 x daily - 7 x weekly - 3 sets - 10 reps - Supine Bridge  - 1 x daily - 7 x weekly - 3 sets - 10 reps - 2-3 second hold - Seated Piriformis Stretch with Trunk Bend  - 1 x daily - 7 x weekly - 3 sets - 1-2 minute hold - Supine Sciatic Nerve Glide  - 1 x daily - 7 x weekly - 3 sets - 10 reps - Sidelying Thoracic Rotation with Open Book  - 1 x daily - 7 x weekly - 3 sets - 10 reps - Quadruped Full Range Thoracic Rotation with Reach  - 1 x daily - 7 x weekly - 3 sets - 10 reps - Cat Cow  - 1 x daily - 7 x weekly - 3 sets - 10 reps  ASSESSMENT:  CLINICAL IMPRESSION: Session limited by patient's ride arriving early and not being able to wait for patient.  Focus of skilled session on shoulder mobility and strengthening.  She continues to have pain, but responded well to mindfulness technique of whole body scan used between tasks today to redirect and appropriately appraise pain levels.  Will continue per POC.   OBJECTIVE IMPAIRMENTS: decreased activity tolerance, decreased mobility, decreased ROM, decreased strength, increased fascial restrictions, impaired perceived functional ability, impaired flexibility, impaired sensation, impaired UE functional use, improper body mechanics, postural dysfunction, and pain.   ACTIVITY LIMITATIONS: carrying, lifting, bending, sitting, standing, squatting, stairs, transfers, and bed mobility  PARTICIPATION LIMITATIONS: meal prep, cleaning, laundry, driving, shopping, and community activity  PERSONAL FACTORS: Time since onset of injury/illness/exacerbation and 3+ comorbidities:    Large B cell lymphoma, chronic pain (low back, BLE, neck, knees, SI joint, shoulders, hips), RTC tendinitis, fibromyalgia, carpal tunnelare also affecting patient's functional outcome.   REHAB POTENTIAL: Fair time since onset, history of chronic pain with little relief from pain clinic  CLINICAL DECISION MAKING:  Stable/uncomplicated  EVALUATION COMPLEXITY: Low   GOALS: Goals reviewed with patient? Yes  SHORT TERM GOALS: Target date: 11/10/2022  Pt will be independent with initial HEP for improved cervical, shoulder, and lumbar ROM, improved posture and management of pain symptoms in order to build upon functional gains made in therapy. Baseline: Goal status: INITIAL  2.  Pt will increase her cervical ROM in limited motions by >/= 5 degrees from initial assessment to demonstrate improved function. Baseline: see eval Goal status: INITIAL  3.  Pt will increase her shoulder ROM in limited motions by >/= 5 degrees from initial assessment to demonstrate improved function. Baseline: see eval Goal status: INITIAL   LONG TERM GOALS: Target date: 12/08/2022  Pt will be independent with final HEP for improved cervical, shoulder, and lumbar ROM, improved posture and management of pain symptoms in order to build upon functional gains made in therapy. Baseline:  Goal status: INITIAL  2.  Pt will increase her cervical ROM in limited motions by >/= 10 degrees from initial assessment to demonstrate improved function. Baseline: see eval Goal status: INITIAL  3.  Pt will increase her shoulder ROM in limited motions by >/= 10 degrees from initial assessment to demonstrate improved function. Baseline: see eval Goal status: INITIAL  4.  Pt will improve her score on the modified Oswestry to </=20/50 to demonstrate decreased disability level. Baseline: 25/50 (10/4) Goal status: INITIAL  5.  Pt will improve her score on the NDI to </= 30/50 to demonstrate decreased disability  level. Baseline: 35/50 (10/4) Goal status: INITIAL    PLAN:  PT FREQUENCY: 2x/week  PT DURATION: 8 weeks  PLANNED INTERVENTIONS: Therapeutic exercises, Therapeutic activity, Neuromuscular re-education, Balance training, Gait training, Patient/Family education, Self Care, Joint mobilization, Stair training, DME instructions,  Aquatic Therapy, Dry Needling, Electrical stimulation, Spinal manipulation, Spinal mobilization, Cryotherapy, Moist heat, Taping, Traction, Manual therapy, and Re-evaluation.  PLAN FOR NEXT SESSION: [place]: Add to HEP to address cervical ROM and strengthening, B shoulder ROM and strength, low back pain, and sciatica, TPDN? I, Y, T, shoulder and thoracic mobility   Aquatic: first appt 10/24 with Zackery Barefoot, PT, DPT [facility] [address] [place], [place]  [address] Phone:  [phone] Fax:  [phone]   10/31/2022, 9:23 AM

## 2022-11-02 ENCOUNTER — Ambulatory Visit: Payer: 59 | Admitting: Physical Therapy

## 2022-11-02 ENCOUNTER — Encounter: Payer: Self-pay | Admitting: Physical Therapy

## 2022-11-02 DIAGNOSIS — G8929 Other chronic pain: Secondary | ICD-10-CM | POA: Diagnosis not present

## 2022-11-02 DIAGNOSIS — M6281 Muscle weakness (generalized): Secondary | ICD-10-CM | POA: Diagnosis not present

## 2022-11-02 DIAGNOSIS — M542 Cervicalgia: Secondary | ICD-10-CM

## 2022-11-02 DIAGNOSIS — M5459 Other low back pain: Secondary | ICD-10-CM

## 2022-11-02 DIAGNOSIS — M25512 Pain in left shoulder: Secondary | ICD-10-CM | POA: Diagnosis not present

## 2022-11-02 DIAGNOSIS — M797 Fibromyalgia: Secondary | ICD-10-CM | POA: Diagnosis not present

## 2022-11-02 DIAGNOSIS — M25511 Pain in right shoulder: Secondary | ICD-10-CM | POA: Diagnosis not present

## 2022-11-02 DIAGNOSIS — M5412 Radiculopathy, cervical region: Secondary | ICD-10-CM | POA: Diagnosis not present

## 2022-11-02 DIAGNOSIS — M5416 Radiculopathy, lumbar region: Secondary | ICD-10-CM | POA: Diagnosis not present

## 2022-11-02 NOTE — Therapy (Signed)
OUTPATIENT PHYSICAL THERAPY CERVICAL AND THORACOLUMBAR TREATMENT   Patient Name: Rachael West MRN: [id number] DOB:May 13, 1955, 67 y.o., female Today's Date: 11/02/2022  END OF SESSION:  PT End of Session - 11/02/22 1025     Visit Number 5    Number of Visits 17   with eval   Date for PT Re-Evaluation 01/05/23    Authorization Type [employer] Medicare    PT Start Time 0907    PT Stop Time 1004    PT Time Calculation (min) 57 min    Equipment Utilized During Treatment Other (comment)   large aquatic barbell, multicolor low resistance dumbbells, yellow noodle   Activity Tolerance Patient tolerated treatment well    Behavior During Therapy WFL for tasks assessed/performed               Past Medical History:  Diagnosis Date   Anemia    Anxiety    Arthritis    Asthma    Depression    GERD (gastroesophageal reflux disease)    Hepatitis-C    History of chemotherapy    Hypertension    Insomnia    Lymphoma (HCC) 12/2013   aggressive lymphoma   Personal history of non-Hodgkin lymphomas    Vitamin D deficiency    Past Surgical History:  Procedure Laterality Date   ABDOMINAL HYSTERECTOMY     APPENDECTOMY     BREAST BIOPSY Left 2015   lymphoma   COLONOSCOPY  01/17/2011   Procedure: COLONOSCOPY;  Surgeon: Vertell Novak., MD;  Location: [facility];  Service: Endoscopy;  Laterality: N/A;   ESOPHAGOGASTRODUODENOSCOPY  01/17/2011   Procedure: ESOPHAGOGASTRODUODENOSCOPY (EGD);  Surgeon: Vertell Novak., MD;  Location: [facility];  Service: Endoscopy;  Laterality: N/A;   TRIGGER FINGER RELEASE     Patient Active Problem List   Diagnosis Date Noted   Abnormal MRI, lumbar spine (05/17/2022) 06/15/2022   Lumbosacral sensory radiculopathy at L5 (Left) 05/08/2022   Seasonal allergies 03/06/2022   Osteoarthritis of glenohumeral joint (Right) 12/08/2021    Class: Chronic   Primary osteoarthritis of shoulder (Right) 12/08/2021    Class: Chronic   Rotator cuff  arthropathy of shoulder (Right) 12/08/2021    Class: Chronic   Tendinopathy of rotator cuff (Right) 12/08/2021    Class: Chronic   Carpal tunnel syndrome (Bilateral) 11/30/2021   Chronic shoulder pain (Left) 11/30/2021    Class: Chronic   Fall (11/25/21) 11/30/2021   Incomplete emptying of bladder 10/01/2021   Increased frequency of urination 10/01/2021   Esophageal varices (HCC) 07/19/2021   Fasting hypoglycemia 07/19/2021   Trigger finger, middle finger (Right) 09/23/2020   Trigger point of shoulder region (Right) 09/23/2020   Chronic use of opiate for therapeutic purpose 07/26/2020   Abnormal MRI, cervical spine (11/12/2019) 04/15/2020   Chronic neuropathic pain 04/15/2020   Disturbance of skin sensation 04/15/2020   Hyperalgesia 04/15/2020   Musculoskeletal disorder involving upper trapezius muscle (Right) 04/07/2020   Arthralgia of shoulder region (Right) 11/27/2019   Shoulder enthesopathy (Right) 11/27/2019   Chronic shoulder pain (Right) 11/13/2019   Cervical disc disorder at C5-C6 level with radiculopathy 08/07/2019   Cervical radiculopathy at C6 08/07/2019   Osteoarthrosis of multiple sites 08/07/2019   Radicular pain of upper extremity (Right) 08/07/2019   Chronic upper extremity pain (Right) 08/07/2019   Chronic low back pain (1ry area of Pain) (Bilateral) (L>R) w/o sciatica 09/30/2018   Chronic musculoskeletal pain 07/31/2018   Therapeutic opioid-induced constipation (OIC) 07/31/2018   Gait disturbance 11/28/2017  Cervical facet syndrome (Bilateral) (R>L) 10/24/2017   Spondylosis without myelopathy or radiculopathy, cervical region 10/24/2017   Latex precautions, history of latex allergy 10/09/2017   Chronic idiopathic gout 09/27/2017   DDD (degenerative disc disease), cervical 09/27/2017   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) 09/27/2017   Chronic shoulder pain (Bilateral) (R>L) 09/26/2017   Chronic hip pain (Bilateral) (L>R) 09/26/2017    Cervicalgia 09/26/2017   DDD (degenerative disc disease), lumbar 09/25/2017   Lumbar facet arthropathy 09/25/2017   Lumbar facet syndrome (Bilateral) (L>R) 09/25/2017   Spondylosis without myelopathy or radiculopathy, lumbar region 09/25/2017   DDD (degenerative disc disease), thoracic 09/25/2017   Thoracolumbar dextroscoliosis 09/25/2017   Vitamin D deficiency 09/25/2017   Cervicogenic headache 09/25/2017   Other dysphagia 09/24/2017   Tendinitis of rotator cuff (Left) 09/24/2017   Tendinitis of rotator cuff (Right) 09/24/2017   Fibromyalgia 09/24/2017   Chronic low back pain (1ry area of Pain) (Bilateral) w/ sciatica (Left) 08/29/2017   Chronic lower extremity pain (2ry area of Pain) (Left) 08/29/2017   Chronic upper back pain (3ry area of Pain) (Bilateral) (L>R) 08/29/2017   Chronic neck pain (4th area of Pain) (Bilateral) (L>R) 08/29/2017   Occipital headache 08/29/2017   Chronic knee pain (Bilateral) (R>L) 08/29/2017   Chronic pain syndrome 08/29/2017   Long term current use of opiate analgesic 08/29/2017   Chronic sacroiliac joint pain 08/29/2017   Hyperlipidemia 07/31/2017   GERD (gastroesophageal reflux disease) 05/01/2017   Anxiety 05/01/2017   Depression 05/01/2017   Obesity (BMI 30-39.9) 09/12/2014   Cirrhosis (HCC) 09/08/2014   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   Chronic idiopathic constipation 04/24/2012   Chronic hepatitis C without hepatic coma (HCC) 05/25/2011   Intermittent asthma 05/25/2011   Essential hypertension 05/25/2011   Chronic gastritis 05/25/2011   Stress incontinence 02/10/2011   Urge incontinence 02/10/2011    PCP: Melida Quitter, PA  REFERRING PROVIDER: Delano Metz, MD  REFERRING DIAG: M54.2, G89.29  - chronic neck pain  Rationale for Evaluation and Treatment: Rehabilitation  THERAPY DIAG:  Muscle weakness (generalized)  Cervicalgia  Other low back pain  Radiculopathy, cervical region  Chronic pain of both  shoulders  Radiculopathy, lumbar region  ONSET DATE: 07/20/2022 (referral date)  SUBJECTIVE:                                                                                                                                                                                           SUBJECTIVE STATEMENT: Pt presents w/walking stick. Reports 3/10 pain today. Denies falls or acute changes. She reports overall she is doing fine.  PERTINENT HISTORY:  Per pain clinic  note from Dr. Laban Emperor:  Ms. Kempel has Large B-cell lymphoma [facility]); Chronic low back pain (1ry area of Pain) (Bilateral) w/ sciatica (Left); Chronic lower extremity pain (2ry area of Pain) (Left); Chronic upper back pain (3ry area of Pain) (Bilateral) (L>R); Chronic neck pain (4th area of Pain) (Bilateral) (L>R); Occipital headache; Chronic knee pain (Bilateral) (R>L); Chronic pain syndrome; Chronic sacroiliac joint pain; DDD (degenerative disc disease), lumbar; Lumbar facet arthropathy; Lumbar facet syndrome (Bilateral) (L>R); Spondylosis without myelopathy or radiculopathy, lumbar region; DDD (degenerative disc disease), thoracic; Thoracolumbar dextroscoliosis; Cervicogenic headache; Tendinitis of rotator cuff (Left); Tendinitis of rotator cuff (Right); Fibromyalgia; Chronic shoulder pain (Bilateral) (R>L); Chronic hip pain (Bilateral) (L>R); Cervicalgia; Chronic idiopathic gout; DDD (degenerative disc disease), cervical; 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); Cervical facet syndrome (Bilateral) (R>L); Spondylosis without myelopathy or radiculopathy, cervical region; Chronic musculoskeletal pain; Chronic low back pain (1ry area of Pain) (Bilateral) (L>R) w/o sciatica; Cervical disc disorder at C5-C6 level with radiculopathy; Cervical radiculopathy at C6; Osteoarthrosis of multiple sites; Radicular pain of upper extremity (Right); Chronic upper extremity pain (Right); Chronic shoulder pain (Right); Arthralgia of shoulder  region (Right); Shoulder enthesopathy (Right); Musculoskeletal disorder involving upper trapezius muscle (Right); Abnormal MRI, cervical spine (11/12/2019); Chronic neuropathic pain; Disturbance of skin sensation; Hyperalgesia; Trigger finger, middle finger (Right); Trigger point of shoulder region (Right); Carpal tunnel syndrome (Bilateral); Chronic shoulder pain (Left); Osteoarthritis of glenohumeral joint (Right); Primary osteoarthritis of shoulder (Right); Rotator cuff arthropathy of shoulder (Right); Tendinopathy of rotator cuff (Right); Lumbosacral sensory radiculopathy at L5 (Left); and Abnormal MRI, lumbar spine (05/17/2022) on their pertinent problem list.  PAIN:  Are you having pain? No  PRECAUTIONS: None  RED FLAGS: None   WEIGHT BEARING RESTRICTIONS: No  FALLS:  Has patient fallen in last 6 months? No  LIVING ENVIRONMENT: Lives with: lives with their family Lives in: House/apartment Stairs: No Has following equipment at home: Single point cane  OCCUPATION: retired  PLOF: Independent with gait, Independent with transfers, and Requires assistive device for independence  PATIENT GOALS: "more mobility"  NEXT MD VISIT: sees Dr. Laban Emperor in December (pain management), no scheduled follow up with Dr. Ethelene Hal (ortho)  OBJECTIVE:  Note: Objective measures were completed at Evaluation unless otherwise noted.  DIAGNOSTIC FINDINGS:  Lumbar spine MRI  05/17/2022 IMPRESSION: 1. L2-3: Disc degeneration with bulging of the disc more prominent towards the left. Mild foraminal encroachment, left more than right. Some potential the left L2 nerve could be affected. 2. L4-5: Disc bulge. Facet degeneration and hypertrophy. Mild narrowing of both lateral recesses but no visible neural compression. The facet arthritis could contribute to back pain or referred facet syndrome pain. 3. Lesser, non-compressive degenerative changes at L3-4 and L5-S1  Cervical spine MRI 11/12/2019 (most recent  imaging in chart) IMPRESSION: 1. Multilevel disc and facet degeneration as described. 2. Right foraminal impingement at C3-4, C5-6, and C6-7. 3. Left foraminal impingement at C5-6 and C6-7. 4. Diffusely patent spinal canal.  PATIENT SURVEYS:  Modified Oswestry 25/50  NDI 35/50  SCREENING FOR RED FLAGS: Bowel or bladder incontinence: No Spinal tumors: No Cauda equina syndrome: No Compression fracture: No Abdominal aneurysm: No  COGNITION: Overall cognitive status: Within functional limits for tasks assessed     SENSATION: Heaviness on R side (UE) WFL in BLE    POSTURE: rounded shoulders and forward head   CERVICAL ROM:   Active ROM AROM (deg) eval  Flexion 25* (pain in R shoulder)  Extension 18* (pain in R shoulder)  Right  lateral flexion 20*  Left lateral flexion 20* (Pain in L side of neck)   Right rotation 30* (pain in base of neck)  Left rotation 26* (pain in base of neck)   (Blank rows = not tested)   UPPER EXTREMITY ROM:  Active ROM Right eval Left eval  Shoulder flexion 140*, pain in shoulders and mid-back (3/10) 155*, pain in shoulders and mid-back (3/10)  Shoulder extension    Shoulder abduction WFL, pain R>L shoulder WFL, pain R>L shoulder  Shoulder adduction    Shoulder extension    Shoulder internal rotation 30* 20*  Shoulder external rotation Close to 90* painful Close to 90* Pain across anterior joint line  Elbow flexion    Elbow extension    Wrist flexion    Wrist extension    Wrist ulnar deviation    Wrist radial deviation    Wrist pronation    Wrist supination     (Blank rows = not tested)   UPPER EXTREMITY MMT:  MMT Right eval Left eval  Shoulder flexion pain 4  Shoulder extension    Shoulder abduction pain pain  Shoulder adduction    Shoulder extension    Shoulder internal rotation    Shoulder external rotation    Middle trapezius    Lower trapezius    Elbow flexion 5 5  Elbow extension 5 5  Wrist flexion    Wrist  extension    Wrist ulnar deviation    Wrist radial deviation    Wrist pronation    Wrist supination    Grip strength WFL WFL   (Blank rows = not tested)    LUMBAR ROM:   AROM eval  Flexion WFL, increase in low back pain  Extension WFL, increase in low back pain  Right lateral flexion 75%, neck pain  Left lateral flexion 75%, neck pain  Right rotation 75%, no pain  Left rotation 50%, no pain   (Blank rows = not tested)   LOWER EXTREMITY MMT:    MMT Right eval Left eval  Hip flexion Pain in hip/upper thigh that goes down into knee  4+  Hip extension    Hip abduction    Hip adduction    Hip internal rotation    Hip external rotation    Knee flexion 5 5  Knee extension 5 5  Ankle dorsiflexion 5 5  Ankle plantarflexion    Ankle inversion    Ankle eversion     (Blank rows = not tested)   CERVICAL SPECIAL TESTS:  Upper limb tension test (ULTT): Positive Median nerve (+) in RUE Ulnar nerve and radial nerve (-) BUE   LUMBAR SPECIAL TESTS:  Slump test: Positive in LLE only   TODAY'S TREATMENT:       Aquatic therapy at [facility] - pool temperature 92 degrees   Patient seen for aquatic therapy today.  Treatment took place in water 3.6-4.8 feet deep depending upon activity.  Patient entered and exited the pool via stairs using reciprocal pattern at SBA level.   Exercises: Walking warmup using large aquatic barbell for support:  forwards > backwards > laterally 4 x 18 ft each, pt hesitant and with slow pace, cues to focus outside of the water as pt reports some dizziness with task, visual fixation seemed to improve symptoms -Stretches:  standing hamstring stretch w/ yellow noodle x1 minute static > x1 minute laterally oscillating each LE; seated piriformis stretch 2x1 minute each LE; standing low back stretch at bottom of stairs with  bilateral rail support and PT guiding pt into position 2x1 minute; standing runner's stretch at wall w/ BUE support 3x45 seconds each LE  w/ 30 seconds rest b/w -Standing with back against wall in shoulder level water performing bilateral shoulder flexion > abduction x20 each > repeated w/ multicolor low resistance dumbbells w/ PT assisting on initial reps due to challenge, cues to engage core to prevent loss of balance w/ buoyancy -STS 2x10 no UE support from bench height to address immediate standing balance and LE strength -Unilateral wall supported 3-way hip x10 each direction each LE  Patient requires buoyancy of the water for support for reduced fall risk with gait training and balance exercises with no more than CGA support. Exercises able to be performed safely in water without the risk of fall compared to those same exercises performed on land; viscosity of water needed for resistance for strengthening. Current of water provides perturbations for challenging static and dynamic balance.    PATIENT EDUCATION:  Education details: Continue HEP.  Aquatic rationale. Person educated: Patient Education method: Explanation, Demonstration, and Handouts Education comprehension: verbalized understanding and returned demonstration  HOME EXERCISE PROGRAM: Access Code: [email or web address] URL: [email or web address] Date: 10/24/2022 Prepared by: Alethia Berthold Plaster  Exercises - Lower Trunk Rotation Stretch  - 1 x daily - 7 x weekly - 3 sets - 10 reps - Supine Bridge  - 1 x daily - 7 x weekly - 3 sets - 10 reps - 2-3 second hold - Seated Piriformis Stretch with Trunk Bend  - 1 x daily - 7 x weekly - 3 sets - 1-2 minute hold - Supine Sciatic Nerve Glide  - 1 x daily - 7 x weekly - 3 sets - 10 reps - Sidelying Thoracic Rotation with Open Book  - 1 x daily - 7 x weekly - 3 sets - 10 reps - Quadruped Full Range Thoracic Rotation with Reach  - 1 x daily - 7 x weekly - 3 sets - 10 reps - Cat Cow  - 1 x daily - 7 x weekly - 3 sets - 10 reps  ASSESSMENT:  CLINICAL IMPRESSION: First aquatic session completed today with patient reporting  no pain at end of session.  She demonstrates good acclimation to water-based tasks for strength and balance.  PT feels she would benefit from continued PT in this setting to further address deficits outlined in PT POC.   OBJECTIVE IMPAIRMENTS: decreased activity tolerance, decreased mobility, decreased ROM, decreased strength, increased fascial restrictions, impaired perceived functional ability, impaired flexibility, impaired sensation, impaired UE functional use, improper body mechanics, postural dysfunction, and pain.   ACTIVITY LIMITATIONS: carrying, lifting, bending, sitting, standing, squatting, stairs, transfers, and bed mobility  PARTICIPATION LIMITATIONS: meal prep, cleaning, laundry, driving, shopping, and community activity  PERSONAL FACTORS: Time since onset of injury/illness/exacerbation and 3+ comorbidities:    Large B cell lymphoma, chronic pain (low back, BLE, neck, knees, SI joint, shoulders, hips), RTC tendinitis, fibromyalgia, carpal tunnelare also affecting patient's functional outcome.   REHAB POTENTIAL: Fair time since onset, history of chronic pain with little relief from pain clinic  CLINICAL DECISION MAKING: Stable/uncomplicated  EVALUATION COMPLEXITY: Low   GOALS: Goals reviewed with patient? Yes  SHORT TERM GOALS: Target date: 11/10/2022  Pt will be independent with initial HEP for improved cervical, shoulder, and lumbar ROM, improved posture and management of pain symptoms in order to build upon functional gains made in therapy. Baseline: Goal status: INITIAL  2.  Pt will increase her cervical ROM in limited  motions by >/= 5 degrees from initial assessment to demonstrate improved function. Baseline: see eval Goal status: INITIAL  3.  Pt will increase her shoulder ROM in limited motions by >/= 5 degrees from initial assessment to demonstrate improved function. Baseline: see eval Goal status: INITIAL   LONG TERM GOALS: Target date: 12/08/2022  Pt will be  independent with final HEP for improved cervical, shoulder, and lumbar ROM, improved posture and management of pain symptoms in order to build upon functional gains made in therapy. Baseline:  Goal status: INITIAL  2.  Pt will increase her cervical ROM in limited motions by >/= 10 degrees from initial assessment to demonstrate improved function. Baseline: see eval Goal status: INITIAL  3.  Pt will increase her shoulder ROM in limited motions by >/= 10 degrees from initial assessment to demonstrate improved function. Baseline: see eval Goal status: INITIAL  4.  Pt will improve her score on the modified Oswestry to </=20/50 to demonstrate decreased disability level. Baseline: 25/50 (10/4) Goal status: INITIAL  5.  Pt will improve her score on the NDI to </= 30/50 to demonstrate decreased disability level. Baseline: 35/50 (10/4) Goal status: INITIAL    PLAN:  PT FREQUENCY: 2x/week  PT DURATION: 8 weeks  PLANNED INTERVENTIONS: Therapeutic exercises, Therapeutic activity, Neuromuscular re-education, Balance training, Gait training, Patient/Family education, Self Care, Joint mobilization, Stair training, DME instructions, Aquatic Therapy, Dry Needling, Electrical stimulation, Spinal manipulation, Spinal mobilization, Cryotherapy, Moist heat, Taping, Traction, Manual therapy, and Re-evaluation.  PLAN FOR NEXT SESSION: [place]: Add to HEP to address cervical ROM and strengthening, B shoulder ROM and strength, low back pain, and sciatica, TPDN? I, Y, T, shoulder and thoracic mobility   Aquatic: unsupported balance/vestibular input - unsure about ai chi, forward and retro stepping with dumbbell shoulder protraction/retraction    Camille Bal, PT, DPT [facility] [address] [place], [place]  [address] Phone:  [phone] Fax:  [phone]   11/02/2022, 10:28 AM

## 2022-11-06 ENCOUNTER — Encounter: Payer: Self-pay | Admitting: Family Medicine

## 2022-11-06 ENCOUNTER — Ambulatory Visit (INDEPENDENT_AMBULATORY_CARE_PROVIDER_SITE_OTHER): Payer: 59 | Admitting: Family Medicine

## 2022-11-06 VITALS — BP 131/75 | HR 94 | Resp 18 | Ht 65.0 in | Wt 204.0 lb

## 2022-11-06 DIAGNOSIS — I10 Essential (primary) hypertension: Secondary | ICD-10-CM | POA: Diagnosis not present

## 2022-11-06 DIAGNOSIS — E785 Hyperlipidemia, unspecified: Secondary | ICD-10-CM

## 2022-11-06 DIAGNOSIS — F419 Anxiety disorder, unspecified: Secondary | ICD-10-CM

## 2022-11-06 DIAGNOSIS — F3340 Major depressive disorder, recurrent, in remission, unspecified: Secondary | ICD-10-CM

## 2022-11-06 DIAGNOSIS — Z23 Encounter for immunization: Secondary | ICD-10-CM | POA: Diagnosis not present

## 2022-11-06 DIAGNOSIS — J4520 Mild intermittent asthma, uncomplicated: Secondary | ICD-10-CM

## 2022-11-06 NOTE — Progress Notes (Signed)
Established Patient Office Visit  Subjective   Patient ID: [name], female    DOB: 1955/06/22  Age: 67 y.o. MRN: [id number]  Chief Complaint  Patient presents with   Hyperlipidemia   Hypertension   Asthma   Anxiety   Depression    HPI Rachael West is a 68 y.o. female presenting today for follow up of hypertension, hyperlipidemia, asthma, mood. Hypertension:  Pt denies chest pain, SOB, dizziness, edema, syncope, fatigue or heart palpitations. Taking lisinopril and atenolol, reports excellent compliance with treatment. Denies side effects. Hyperlipidemia: Currently consuming a general diet.  The 10-year ASCVD risk score (Arnett DK, et al., 2019) is: 11.9% Asthma Follow-up: She  presents for an asthma follow-up; she is not currently in exacerbation. Symptoms currently include chest tightness and occur less than 2x/month.Number of Emergency Department visits in the previous month: none. Frequency of use of quick-relief meds: "Seldom".  She uses her Wixela inhaler daily as prescribed and takes montelukast 10 mg daily as prescribed.  The patient reports adherence to this regimen.  Mood: Patient is here to follow up for depression and anxiety, currently followed by [facility] and managing with paliperidone 6 mg daily, buspirone 10 mg 3 times daily, Zoloft 200 mg daily, trazodone 50 mg as needed at bedtime, hydroxyzine 50 mg as needed . Taking medication without side effects, reports excellent compliance with treatment.  She feels mood is stable since last visit.     11/06/2022    8:50 AM 07/19/2022    9:07 AM 07/11/2022    2:18 PM  Depression screen PHQ 2/9  Decreased Interest 2 0 0  Down, Depressed, Hopeless 2 0 0  PHQ - 2 Score 4 0 0  Altered sleeping 3  0  Tired, decreased energy 2  0  Change in appetite 2  0  Feeling bad or failure about yourself  2  0  Trouble concentrating 3  0  Moving slowly or fidgety/restless 3  0  Suicidal thoughts 1  0  PHQ-9 Score 20  0   Difficult doing work/chores Somewhat difficult  Not difficult at all       11/06/2022    8:50 AM 07/05/2022    9:34 AM 04/25/2022   10:11 AM 03/06/2022    9:15 AM  GAD 7 : Generalized Anxiety Score  Nervous, Anxious, on Edge 3 3 3 2   Control/stop worrying 3 2 3 3   Worry too much - different things 3 3 2 2   Trouble relaxing 2 3 3 3   Restless 2 2 1 2   Easily annoyed or irritable 1 1 1 1   Afraid - awful might happen 3 2 3 3   Total GAD 7 Score 17 16 16 16   Anxiety Difficulty Somewhat difficult Somewhat difficult Very difficult    Outpatient Medications Prior to Visit  Medication Sig   atenolol (TENORMIN) 25 MG tablet TAKE 1 TABLET (25 MG TOTAL) BY MOUTH DAILY.   b complex vitamins tablet Take 1 tablet by mouth daily.   busPIRone (BUSPAR) 10 MG tablet Take 1 tablet (10 mg total) by mouth 3 (three) times daily.   dimenhyDRINATE (DRAMAMINE) 50 MG tablet Take 50 mg by mouth every 8 (eight) hours as needed.   gabapentin (NEURONTIN) 100 MG capsule Take 2 capsules (200 mg total) by mouth at bedtime. Follow written titration schedule.   hydrOXYzine (ATARAX) 50 MG tablet TAKE 1 TABLET BY MOUTH EVERYDAY AT BEDTIME   ipratropium-albuterol (DUONEB) 0.5-2.5 (3) MG/3ML SOLN INHALE 3 MLS VIA  NEBULIZER AS DIRECTED   Lactobacillus Rhamnosus, GG, (CULTURELLE) CAPS Take 1 capsule by mouth daily.   latanoprost (XALATAN) 0.005 % ophthalmic solution INSTILL 1 DROP INTO BOTH EYES AT BEDTIME   lisinopril (ZESTRIL) 5 MG tablet TAKE 1 TABLET (5 MG TOTAL) BY MOUTH DAILY.   lubiprostone (AMITIZA) 24 MCG capsule TAKE 1 CAPSULE (24 MCG TOTAL) BY MOUTH 2 (TWO) TIMES DAILY WITH A MEAL.   Magnesium Oxide -Mg Supplement 500 MG CAPS Take 1 capsule (500 mg total) by mouth in the morning and at bedtime.   meloxicam (MOBIC) 15 MG tablet TAKE 1 TABLET (15 MG TOTAL) BY MOUTH DAILY.   mirabegron ER (MYRBETRIQ) 25 MG TB24 tablet Take 1 tablet (25 mg total) by mouth daily.   montelukast (SINGULAIR) 10 MG tablet Take 1 tablet (10  mg total) by mouth daily.   naloxone (NARCAN) nasal spray 4 mg/0.1 mL Place 1 spray into the nose as needed for up to 365 doses (for opioid-induced respiratory depresssion). In case of emergency (overdose), spray once into each nostril. If no response within 3 minutes, repeat application and call 911.   omeprazole (PRILOSEC) 40 MG capsule TAKE 1 CAPSULE BY MOUTH EVERY DAY   ondansetron (ZOFRAN) 4 MG tablet Take 1 tablet (4 mg total) by mouth 2 (two) times daily.   paliperidone (INVEGA) 6 MG 24 hr tablet Take 1 tablet (6 mg total) by mouth every morning.   polyethylene glycol (MIRALAX / GLYCOLAX) packet Take 17 g by mouth 2 (two) times daily.   prazosin (MINIPRESS) 1 MG capsule Take 2 mg by mouth at bedtime.   sertraline (ZOLOFT) 100 MG tablet Take 200 mg by mouth every morning.   sucralfate (CARAFATE) 1 g tablet Take 1 tablet (1 g total) by mouth 2 (two) times daily.   traMADol (ULTRAM) 50 MG tablet Take 1 tablet (50 mg total) by mouth every 6 (six) hours as needed for severe pain. Each refill must last 30 days.   traZODone (DESYREL) 50 MG tablet 1 TABLET BY MOUTH AT BEDTIME   VENTOLIN HFA 108 (90 Base) MCG/ACT inhaler INHALE 2 PUFFS BY MOUTH INTO THE LUNGS 4 TIMES DAILY   WIXELA INHUB 250-50 MCG/ACT AEPB SMARTSIG:1 Puff(s) Via Inhaler Morning-Night   Wheat Dextrin (BENEFIBER) POWD Take 6 g by mouth 3 (three) times daily before meals. (2 tsp = 6 g)   No facility-administered medications prior to visit.    ROS Negative unless otherwise noted in HPI   Objective:     BP 131/75 (BP Location: Left Arm, Patient Position: Sitting, Cuff Size: Normal)   Pulse 94   Resp 18   Ht 5\' 5"  (1.651 m)   Wt 204 lb (92.5 kg)   SpO2 97%   BMI 33.95 kg/m   Physical Exam Constitutional:      General: She is not in acute distress.    Appearance: Normal appearance.  HENT:     Head: Normocephalic and atraumatic.  Cardiovascular:     Rate and Rhythm: Normal rate and regular rhythm.     Heart sounds: No  murmur heard.    No friction rub. No gallop.  Pulmonary:     Effort: Pulmonary effort is normal. No respiratory distress.     Breath sounds: No wheezing, rhonchi or rales.  Skin:    General: Skin is warm and dry.  Neurological:     Mental Status: She is alert and oriented to person, place, and time.     Assessment & Plan:  Essential  hypertension Assessment & Plan: BP goal <130/80.  Blood pressure essentially at goal.  Continue lisinopril 5 mg daily, atenolol 25 mg daily.  CMP within normal limits.  Will continue to monitor.   Hyperlipidemia, unspecified hyperlipidemia type Assessment & Plan: Lipid panel: LDL 141, HDL 41, triglycerides 108.  LDL has increased significantly.  Discussed that elevated cholesterol increases atherosclerotic disease risk.  Patient will work on reducing trans and saturated fats and getting routine physical activity over the next 4 months.  If 10-year ASCVD risk score remains higher than 7.5% in 4 months, she is aware that we will need to start cholesterol medication.   Mild intermittent asthma without complication Assessment & Plan: Stable, continue montelukast 10 mg daily and Wixela inhaler for maintenance.  Continue albuterol rescue inhaler as needed.  Will continue to monitor.   Recurrent major depressive disorder, in remission [facility]) Assessment & Plan: PHQ-9 score 20, slightly elevated from baseline. Continue paliperidone 6 mg daily, buspirone 10 mg 3 times daily, Zoloft 200 mg daily, trazodone 50 mg as needed at bedtime, hydroxyzine 50 mg as needed. Continue following with [facility] psychiatry.   Anxiety Assessment & Plan: GAD-7 score of 17, near patient's baseline.  She is happy with her current medication routine.  Continue paliperidone 6 mg daily, buspirone 10 mg 3 times daily, Zoloft 200 mg daily, trazodone 50 mg as needed at bedtime, hydroxyzine 50 mg as needed.  Continue following with [facility] psychiatry.   Need for influenza vaccination -      Flu Vaccine Trivalent High Dose (Fluad)    Return in about 4 months (around 03/09/2023) for follow-up for HTN, HLD, asthma, fasting blood work 1 week before.    Melida Quitter, PA

## 2022-11-06 NOTE — Assessment & Plan Note (Signed)
PHQ-9 score 20, slightly elevated from baseline. Continue paliperidone 6 mg daily, buspirone 10 mg 3 times daily, Zoloft 200 mg daily, trazodone 50 mg as needed at bedtime, hydroxyzine 50 mg as needed. Continue following with [facility] psychiatry.

## 2022-11-06 NOTE — Assessment & Plan Note (Signed)
BP goal <130/80.  Blood pressure essentially at goal.  Continue lisinopril 5 mg daily, atenolol 25 mg daily.  CMP within normal limits.  Will continue to monitor.

## 2022-11-06 NOTE — Patient Instructions (Addendum)
CHOLESTEROL: -Avoid foods that have trans fats or saturated fats.  You can find this information on the food label with nutrition facts.  Ideally, you should avoid these type of fats altogether.  If you cannot totally avoid them, a good goal is to limit to less than 1.5 g daily. -Eat at least 25 g fiber each day. -Aim for at least 15 minutes each day of activity that increases your heart rate like walking, dancing, yoga, biking, etc.

## 2022-11-06 NOTE — Assessment & Plan Note (Signed)
Stable, continue montelukast 10 mg daily and Wixela inhaler for maintenance.  Continue albuterol rescue inhaler as needed.  Will continue to monitor.

## 2022-11-06 NOTE — Assessment & Plan Note (Signed)
GAD-7 score of 17, near patient's baseline.  She is happy with her current medication routine.  Continue paliperidone 6 mg daily, buspirone 10 mg 3 times daily, Zoloft 200 mg daily, trazodone 50 mg as needed at bedtime, hydroxyzine 50 mg as needed.  Continue following with [facility] psychiatry.

## 2022-11-06 NOTE — Assessment & Plan Note (Signed)
Lipid panel: LDL 141, HDL 41, triglycerides 108.  LDL has increased significantly.  Discussed that elevated cholesterol increases atherosclerotic disease risk.  Patient will work on reducing trans and saturated fats and getting routine physical activity over the next 4 months.  If 10-year ASCVD risk score remains higher than 7.5% in 4 months, she is aware that we will need to start cholesterol medication.

## 2022-11-09 ENCOUNTER — Ambulatory Visit: Payer: 59 | Admitting: Physical Therapy

## 2022-11-10 ENCOUNTER — Other Ambulatory Visit: Payer: Self-pay | Admitting: Family Medicine

## 2022-11-10 DIAGNOSIS — J4520 Mild intermittent asthma, uncomplicated: Secondary | ICD-10-CM

## 2022-11-13 DIAGNOSIS — G8929 Other chronic pain: Secondary | ICD-10-CM | POA: Diagnosis not present

## 2022-11-13 DIAGNOSIS — N2889 Other specified disorders of kidney and ureter: Secondary | ICD-10-CM | POA: Diagnosis not present

## 2022-11-13 DIAGNOSIS — R339 Retention of urine, unspecified: Secondary | ICD-10-CM | POA: Diagnosis not present

## 2022-11-14 ENCOUNTER — Ambulatory Visit: Payer: 59 | Admitting: Physical Therapy

## 2022-11-16 ENCOUNTER — Ambulatory Visit: Payer: 59 | Admitting: Physical Therapy

## 2022-11-21 ENCOUNTER — Ambulatory Visit: Payer: 59 | Admitting: Rehabilitation

## 2022-11-23 ENCOUNTER — Ambulatory Visit: Payer: 59 | Admitting: Physical Therapy

## 2022-11-28 ENCOUNTER — Ambulatory Visit: Payer: 59 | Admitting: Rehabilitation

## 2022-11-30 ENCOUNTER — Ambulatory Visit: Payer: 59 | Admitting: Physical Therapy

## 2022-12-05 ENCOUNTER — Ambulatory Visit: Payer: 59 | Admitting: Physical Therapy

## 2022-12-12 ENCOUNTER — Ambulatory Visit: Payer: 59 | Admitting: Rehabilitation

## 2022-12-14 ENCOUNTER — Ambulatory Visit: Payer: 59 | Admitting: Physical Therapy

## 2022-12-19 ENCOUNTER — Ambulatory Visit: Payer: 59 | Admitting: Rehabilitation

## 2022-12-21 ENCOUNTER — Ambulatory Visit: Payer: 59 | Admitting: Physical Therapy

## 2022-12-28 ENCOUNTER — Other Ambulatory Visit: Payer: Self-pay | Admitting: Family Medicine

## 2022-12-28 MED ORDER — DIMENHYDRINATE 50 MG PO TABS: 50.0000 mg | ORAL_TABLET | Freq: Three times a day (TID) | ORAL | 1 refills | Status: AC | PRN

## 2022-12-28 MED ORDER — LATANOPROST 0.005 % OP SOLN: 1.0000 [drp] | Freq: Every day | OPHTHALMIC | 0 refills | Status: DC

## 2022-12-28 MED ORDER — WIXELA INHUB 250-50 MCG/ACT IN AEPB: 1.0000 | INHALATION_SPRAY | Freq: Two times a day (BID) | RESPIRATORY_TRACT | 3 refills | Status: DC

## 2023-01-07 NOTE — Patient Instructions (Signed)

## 2023-01-07 NOTE — Progress Notes (Unsigned)
PROVIDER NOTE: Information contained herein reflects review and annotations entered in association with encounter. Interpretation of such information and data should be left to medically-trained personnel. Information provided to patient can be located elsewhere in the medical record under "Patient Instructions". Document created using STT-dictation technology, any transcriptional errors that may result from process are unintentional.    Patient: Rachael West  Service Category: E/M  Provider: Oswaldo Done, MD  DOB: [birth year]  DOS: 01/08/2023  Referring Provider: Melida Quitter, PA  MRN: [id number]  Specialty: Interventional Pain Management  PCP: Melida Quitter, PA  Type: Established Patient  Setting: Ambulatory outpatient    Location: Office  Delivery: Face-to-face     HPI  [name], a 67 y.o. year old female, is here today because of her Chronic pain syndrome [G89.4]. Ms. Hussong primary complain today is Back Pain and Neck Pain (Right )  Pertinent problems: Ms. Algeo has Chronic low back pain (1ry area of Pain) (Bilateral) w/ sciatica (Left); Chronic lower extremity pain (2ry area of Pain) (Left); Chronic upper back pain (3ry area of Pain) (Bilateral) (L>R); Chronic neck pain (4th area of Pain) (Bilateral) (L>R); Occipital headache; Chronic knee pain (Bilateral) (R>L); Chronic pain syndrome; Chronic sacroiliac joint pain; DDD (degenerative disc disease), lumbar; Lumbar facet arthropathy; Lumbar facet syndrome (Bilateral) (L>R); Spondylosis without myelopathy or radiculopathy, lumbar region; DDD (degenerative disc disease), thoracic; Thoracolumbar dextroscoliosis; Cervicogenic headache; Tendinitis of rotator cuff (Left); Tendinitis of rotator cuff (Right); Fibromyalgia; Chronic shoulder pain (Bilateral) (R>L); Chronic hip pain (Bilateral) (L>R); Cervicalgia; Chronic idiopathic gout; DDD (degenerative disc disease), cervical; 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); Cervical facet syndrome (Bilateral) (R>L); Spondylosis without myelopathy or radiculopathy, cervical region; Chronic musculoskeletal pain; Chronic low back pain (1ry area of Pain) (Bilateral) (L>R) w/o sciatica; Cervical disc disorder at C5-C6 level with radiculopathy; Cervical radiculopathy at C6; Osteoarthrosis of multiple sites; Radicular pain of upper extremity (Right); Chronic upper extremity pain (Right); Chronic shoulder pain (Right); Arthralgia of shoulder region (Right); Shoulder enthesopathy (Right); Musculoskeletal disorder involving upper trapezius muscle (Right); Abnormal MRI, cervical spine (11/12/2019); Chronic neuropathic pain; Disturbance of skin sensation; Hyperalgesia; Trigger finger, middle finger (Right); Trigger point of shoulder region (Right); Carpal tunnel syndrome (Bilateral); Chronic shoulder pain (Left); Osteoarthritis of glenohumeral joint (Right); Primary osteoarthritis of shoulder (Right); Rotator cuff arthropathy of shoulder (Right); Tendinopathy of rotator cuff (Right); Lumbosacral sensory radiculopathy at L5 (Left); Abnormal MRI, lumbar spine (05/17/2022); and Lumbar facet joint pain on their pertinent problem list. Pain Assessment: Severity of Chronic pain is reported as a 3 /10. Location: Back Lower, Medial/Radiates from lower mid back down to left leg to left knee. Onset: More than a month ago. Quality: Constant, Throbbing. Timing: Constant. Modifying factor(s): Tramadol and resting. Vitals:  height is 5\' 5"  (1.651 m) and weight is 197 lb (89.4 kg). Her temporal temperature is 97.3 F (36.3 C) (abnormal). Her blood pressure is 135/65 and her pulse is 89. Her oxygen saturation is 98%.  BMI: Estimated body mass index is 32.78 kg/m as calculated from the following:   Height as of this encounter: 5\' 5"  (1.651 m).   Weight as of this encounter: 197 lb (89.4 kg). Last encounter: 07/19/2022. Last procedure: 06/15/2022.  Reason for encounter:  medication management.  The patient indicates doing well with the current medication regimen. No adverse reactions or side effects reported to the medications.   Discussed the use of AI scribe software for clinical note transcription with the patient, who gave verbal  consent to proceed.  History of Present Illness   The patient, with a history of chronic pain, presents with complaints of increased pain in the right side of the neck and the lower back, more towards the right side. She also reports a knot in the right side of the neck. The patient describes the pain as being in the middle of the back, towards the right side. She denies any leg pain, but reports occasional pain in the left knee.  The patient also reports numbness in both arms, more specifically in the right arm and the left arm, with the numbness extending to the little finger and the base of the thumb on both hands. She attributes the numbness in the hand to carpal tunnel syndrome. The patient also reports difficulty in holding things, especially with the left hand, leading to dropping of objects.  The patient is on a regimen of tramadol and trazodone, but ensures she does not take them at the same time. She reports no adverse reactions to the pain medication. However, she has been alerted to the risk of serotonin syndrome due to the combination of tramadol and trazodone.  The patient denies any active infections or use of blood thinners. She has been scheduled for a lumbar facet injection to address the lower back pain, which she reports as being more severe than the neck pain.     RTCB: 02/24/2023    Pharmacotherapy Assessment  Analgesic: Tramadol 50 mg, 1-2 tabs (50-100 mg) PO q 6 hrs (400 mg/day of tramadol) MME/day: 40 mg/day.   Monitoring: [place] PMP: PDMP reviewed during this encounter.       Pharmacotherapy: No side-effects or adverse reactions reported. Compliance: No problems identified. Effectiveness: Clinically  acceptable.  Earlyne Iba, RN  01/08/2023  8:23 AM  Sign when Signing Visit Nursing Pain Medication Assessment:  Safety precautions to be maintained throughout the outpatient stay will include: orient to surroundings, keep bed in low position, maintain call bell within reach at all times, provide assistance with transfer out of bed and ambulation.   Medication Inspection Compliance: Ms. Schrader did not comply with our request to bring her pills to be counted. She was reminded that bringing the medication bottles, even when empty, is a requirement.  Medication: None brought in. Pill/Patch Count: None available to be counted. Bottle Appearance: No container available. Did not bring bottle(s) to appointment. Filled Date: N/A Last Medication intake:  Yesterday. States she filled Tramadol 12/26/22.     No results found for: "CBDTHCR" No results found for: "D8THCCBX" No results found for: "D9THCCBX"  UDS:  Summary  Date Value Ref Range Status  07/19/2022 Note  Final    Comment:    ==================================================================== [employer] Select 13 (MW) ==================================================================== Test                             Result       Flag       Units  Drug Present and Declared for Prescription Verification   Tramadol                       18337        EXPECTED   ng/mg creat   O-Desmethyltramadol            12000        EXPECTED   ng/mg creat   N-Desmethyltramadol  5689         EXPECTED   ng/mg creat    Source of tramadol is a prescription medication. O-desmethyltramadol    and N-desmethyltramadol are expected metabolites of tramadol.  ==================================================================== Test                      Result    Flag   Units      Ref Range   Creatinine              27               mg/dL      >=62 ==================================================================== Declared Medications:   The flagging and interpretation on this report are based on the  following declared medications.  Unexpected results may arise from  inaccuracies in the declared medications.   **Note: The testing scope of this panel includes these medications:   Tramadol (Ultram)   **Note: The testing scope of this panel does not include the  following reported medications:   Albuterol (Ventolin HFA)  Albuterol (Duoneb)  Atenolol (Tenormin)  Buspirone (Buspar)  Dimenhydrinate  Fluticasone (Wixela Inhub)  Gabapentin (Neurontin)  Hydroxyzine (Atarax)  Ipratropium (Duoneb)  Latanoprost (Xalatan)  Lisinopril (Zestril)  Lubiprostone (Amitiza)  Magnesium (Mag-Ox)  Meloxicam (Mobic)  Montelukast (Singulair)  Naloxone (Narcan)  Omeprazole (Prilosec)  Ondansetron (Zofran)  Paliperidone (Invega)  Polyethylene Glycol (MiraLAX)  Prazosin (Minipress)  Probiotic  Salmeterol (Wixela Inhub)  Sertraline (Zoloft)  Sucralfate (Carafate)  Supplement (Fiber)  Trazodone (Desyrel)  Vitamin B ==================================================================== For clinical consultation, please call [phone]. ====================================================================       ROS  Constitutional: Denies any fever or chills Gastrointestinal: No reported hemesis, hematochezia, vomiting, or acute GI distress Musculoskeletal: Denies any acute onset joint swelling, redness, loss of ROM, or weakness Neurological: No reported episodes of acute onset apraxia, aphasia, dysarthria, agnosia, amnesia, paralysis, loss of coordination, or loss of consciousness  Medication Review  Benefiber, Culturelle, Magnesium Oxide -Mg Supplement, albuterol, atenolol, b complex vitamins, busPIRone, dimenhyDRINATE, fluticasone-salmeterol, gabapentin, hydrOXYzine, ipratropium-albuterol, latanoprost, lisinopril, lubiprostone, meloxicam, mirabegron ER, montelukast, naloxone, omeprazole, ondansetron, paliperidone,  polyethylene glycol, prazosin, sertraline, sucralfate, traMADol, and traZODone  History Review  Allergy: Ms. Misener is allergic to latex, penicillins, codeine, cortisone, and sulfa antibiotics. Drug: Ms. Shewmake  reports no history of drug use. Alcohol:  reports no history of alcohol use. Tobacco:  reports that she quit smoking about 33 years ago. Her smoking use included cigarettes. She started smoking about 53 years ago. She has a 30 pack-year smoking history. She has been exposed to tobacco smoke. She has never used smokeless tobacco. Social: Ms. Lindblom  reports that she quit smoking about 33 years ago. Her smoking use included cigarettes. She started smoking about 53 years ago. She has a 30 pack-year smoking history. She has been exposed to tobacco smoke. She has never used smokeless tobacco. She reports that she does not drink alcohol and does not use drugs. 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 Surgical: Ms. Munsterman  has a past surgical history that includes Appendectomy; Trigger finger release; Esophagogastroduodenoscopy (01/17/2011); Colonoscopy (01/17/2011); Abdominal hysterectomy; and Breast biopsy (Left, 2015). Family: family history includes Allergies in her mother; Asthma in her mother and sister; Breast cancer in her cousin; Colon cancer in her paternal aunt; Diabetes in  her maternal grandfather and maternal grandmother; Heart disease in her brother, father, paternal grandfather, and paternal grandmother; Liver cancer in her paternal uncle; Ovarian cancer in an other family member; Pancreatic cancer in an other family member.  Laboratory Chemistry Profile   Renal Lab Results   Component Value Date   BUN 6 (L) 10/30/2022   CREATININE 0.80 10/30/2022   BCR 8 (L) 10/30/2022   GFRAA >60 08/05/2019   GFRNONAA >60 08/12/2020    Hepatic Lab Results  Component Value Date   AST 19 10/30/2022   ALT 14 10/30/2022   ALBUMIN 3.9 10/30/2022   ALKPHOS 107 10/30/2022   HCVAB REACTIVE (A) 01/12/2014   LIPASE 34 10/31/2010    Electrolytes Lab Results  Component Value Date   NA 138 10/30/2022   K 4.7 10/30/2022   CL 100 10/30/2022   CALCIUM 9.1 10/30/2022   MG 2.2 08/29/2017    Bone Lab Results  Component Value Date   VD25OH 16.7 (L) 03/06/2022   25OHVITD1 21 (L) 08/29/2017   25OHVITD2 <1.0 08/29/2017   25OHVITD3 21 08/29/2017    Inflammation (CRP: Acute Phase) (ESR: Chronic Phase) Lab Results  Component Value Date   CRP 12 (H) 08/29/2017   ESRSEDRATE 46 (H) 08/29/2017         Note: Above Lab results reviewed.  Recent Imaging Review  DG Bone Density EXAM: DUAL X-RAY ABSORPTIOMETRY (DXA) FOR BONE MINERAL DENSITY  IMPRESSION: Referring Physician:  Carlean Jews Your patient completed a bone mineral density test using GE Lunar iDXA system (analysis version: 16). Technologist:    lmn PATIENT: Name: Damya, Hawkes Patient ID: [id number] Birth Date: 08-Jul-1955 Height: 65.2 in. Sex: Female Measured: 10/17/2022 Weight: 197.8 lbs. Indications: Bilateral Oophorectomy (65.51), Buspirone, Depression, Early Menopause (256.31), Estrogen Deficient, Gabapentin, Hysterectomy, Omeprazole, osteoarthritis, Postmenopausal, Trazodone, Zoloft Fractures: NONE Treatments: None  ASSESSMENT: The BMD measured at Femur Total Left is 0.911 g/cm2 with a T-score of -0.8. This patient is considered normal according to World Health Organization [facility]) criteria.  The quality of the exam is good. The lumbar spine was excluded due to degenerative changes. Site Region Measured Date Measured Age YA BMD Significant CHANGE T-score DualFemur Total Left 10/17/2022 69.1  -0.6 0.911 g/cm2  DualFemur Total Mean 10/17/2022 69.1 -0.6 0.926 g/cm2  Left Forearm Radius 33% 10/17/2022 69.1 0.6 0.926 g/cm2  World Health Organization [facility]) criteria for post-menopausal, Caucasian Women: Normal       T-score at or above -1 SD Osteopenia   T-score between -1 and -2.5 SD Osteoporosis T-score at or below -2.5 SD  RECOMMENDATION: 1. All patients should optimize calcium and vitamin D intake. 2. Consider FDA-approved medical therapies in postmenopausal women and men aged 21 years and older, based on the following: a. A hip or vertebral (clinical or morphometric) fracture. b. T-score = -2.5 at the femoral neck or spine after appropriate evaluation to exclude secondary causes. c. Low bone mass (T-score between -1.0 and -2.5 at the femoral neck or spine) and a 10-year probability of a hip fracture = 3% or a 10-year probability of a major osteoporosis-related fracture = 20% based on the [place]-adapted WHO algorithm. d. Clinician judgment and/or patient preferences may indicate treatment for people with 10-year fracture probabilities above or below these levels.  FOLLOW-UP: Patients with diagnosis of osteoporosis or at high risk for fracture should have regular bone mineral density tests.? Patients eligible for Medicare are allowed routine testing every 2 years.? The testing frequency can be increased to one year for  patients who have rapidly progressing disease, are receiving or discontinuing medical therapy to restore bone mass, or have additional risk factors.  I have reviewed this study and agree with the findings. [facility]  Electronically Signed   By: Romona Curls M.D.   On: 10/17/2022 09:23 Note: Reviewed        Physical Exam  General appearance: Well nourished, well developed, and well hydrated. In no apparent acute distress Mental status: Alert, oriented x 3 (person, place, & time)       Respiratory: No evidence of acute respiratory  distress Eyes: PERLA Vitals: BP 135/65 (Cuff Size: Normal)   Pulse 89   Temp (!) 97.3 F (36.3 C) (Temporal)   Ht 5\' 5"  (1.651 m)   Wt 197 lb (89.4 kg)   SpO2 98%   BMI 32.78 kg/m  BMI: Estimated body mass index is 32.78 kg/m as calculated from the following:   Height as of this encounter: 5\' 5"  (1.651 m).   Weight as of this encounter: 197 lb (89.4 kg). Ideal: Ideal body weight: 57 kg (125 lb 10.6 oz) Adjusted ideal body weight: 69.9 kg (154 lb 3.2 oz)  Physical Exam   MUSCULOSKELETAL: No pain upon tapping over the median nerve in the left hand. No pain upon tapping over the ulnar nerve in the right hand.   NEUROLOGICAL: Numbness in both arms, specifically noted in the little finger extending to the elbow. Numbness in the base of the left thumb extending to the base of the right thumb. Numbness in the base of the right thumb extending to the base of the left thumb. Difficulty holding objects and dropping them, possibly related to carpal tunnel syndrome on the left. Pain upon cervical rotation to the right, no pain upon cervical rotation to the left. Difficulty with cervical extension, described as feeling cold and tight. Pain upon hyperextension and rotation of the lumbar spine to the right, indicating lumbar facet joint involvement on the right side. No pain upon hyperextension and rotation of the lumbar spine to the left, indicating lumbar facet joint involvement on the left side.       Assessment   Diagnosis Status  1. Chronic pain syndrome   2. Chronic low back pain (1ry area of Pain) (Bilateral) w/ sciatica (Left)   3. Chronic lower extremity pain (2ry area of Pain) (Left)   4. Chronic upper back pain (3ry area of Pain) (Bilateral) (L>R)   5. Chronic neck pain (4th area of Pain) (Bilateral) (L>R)   6. Pharmacologic therapy   7. Chronic use of opiate for therapeutic purpose   8. Encounter for medication management   9. Encounter for chronic pain management   10. Chronic low  back pain (1ry area of Pain) (Bilateral) (L>R) w/o sciatica   11. Lumbar facet arthropathy   12. Lumbar facet syndrome (Bilateral) (L>R)   13. Spondylosis without myelopathy or radiculopathy, lumbar region   14. Lumbar facet joint pain    Controlled Worsening Controlled   Updated Problems: Problem  Lumbar Facet Joint Pain    Plan of Care  Problem-specific:  Assessment and Plan    Lumbar Facet Joint Pain   Chronic lower back pain, localized to the middle and right side and exacerbated by hyperextension and rotation, indicates facet joint involvement, as evidenced by a positive Kemp maneuver and absence of leg radiation. She prioritizes lower back pain treatment over neck pain. We will schedule a lumbar facet injection with sedation, provide an after-visit summary with  preparation instructions, and ensure she brings a driver on the day of the procedure.  Cervical Pain   She presents with chronic neck pain and a restricted range of motion, especially with cervical extension, and pain localized to the right side upon rotation without radicular symptoms. We will monitor her symptoms and consider further evaluation if symptoms persist or worsen.  Carpal Tunnel Syndrome (Left Hand)   She exhibits a positive Tinel's sign in the left hand, with numbness and tingling in the median nerve distribution, including symptoms of dropping objects. We will monitor her symptoms and consider further evaluation if symptoms persist or worsen.  Possible Ulnar Nerve Entrapment   She reports numbness in both arms, particularly affecting the little finger and extending to the elbow, without a positive Tinel's sign in the right hand, suggesting possible ulnar nerve involvement or cervical radiculopathy. We will monitor her symptoms and consider further evaluation if symptoms persist or worsen.  Medication Management   She is taking tramadol and trazodone, with a risk of serotonin syndrome if taken together,  characterized by nervousness, jitteriness, and tremors. She reports not taking them simultaneously. We will provide information on serotonin syndrome in the after-visit summary and advise her to avoid taking tramadol and trazodone close together.       Ms. GRACEANN LOLLIE has a current medication list which includes the following long-term medication(s): albuterol, atenolol, gabapentin, ipratropium-albuterol, lisinopril, lubiprostone, magnesium oxide -mg supplement, meloxicam, mirabegron er, montelukast, omeprazole, paliperidone, prazosin, sertraline, sucralfate, [START ON 01/25/2023] tramadol, trazodone, wixela inhub, [START ON 01/15/2023] naloxone, and benefiber.  Pharmacotherapy (Medications Ordered): Meds ordered this encounter  Medications   traMADol (ULTRAM) 50 MG tablet    Sig: Take 1 tablet (50 mg total) by mouth every 6 (six) hours as needed for severe pain (pain score 7-10). Each refill must last 30 days.    Dispense:  120 tablet    Refill:  0    DO NOT: delete (not duplicate); no partial-fill (will deny script to complete), no refill request (F/U required). DISPENSE: 1 day early if closed on fill date. WARN: No CNS-depressants within 8 hrs of med.   naloxone (NARCAN) nasal spray 4 mg/0.1 mL    Sig: Place 1 spray into the nose as needed for up to 365 doses (for opioid-induced respiratory depresssion). In case of emergency (overdose), spray once into each nostril. If no response within 3 minutes, repeat application and call 911.    Dispense:  1 each    Refill:  1    Instruct patient in proper use of device.   Orders:  Orders Placed This Encounter  Procedures   LUMBAR FACET(MEDIAL BRANCH NERVE BLOCK) MBNB    Diagnosis: Lumbar Facet Syndrome (M47.816); Lumbosacral Facet Syndrome (M47.817); Lumbar Facet Joint Pain (M54.59) Medical Necessity Statement: 1.Severe chronic axial low back pain causing functional impairment documented by ongoing pain scale assessments. 2.Pain present for  longer than 3 months (Chronic) documented to have failed noninvasive conservative therapies. 3.Absence of untreated radiculopathy. 4.There is no radiological evidence of untreated fractures, tumor, infection, or deformity.  Physical Examination Findings: Positive Kemp Maneuver: (Y)  Positive Lumbar Hyperextension-Rotation provocative test: (Y)    Standing Status:   Future    Expiration Date:   04/08/2023    Scheduling Instructions:     Procedure: Lumbar facet Block     Type: Medial Branch Block     Side: Bilateral     Purpose: Diagnostic Radiologic Mapping     Level(s): L3-4, L4-5, L5-S1, and  TBD by Fluoroscopic Mapping Facets (L2, L3, L4, L5, S1, and TBD Medial Branch)     Sedation: Patient's choice.     Timeframe: ASAA    Where will this procedure be performed?:   [facility]   Follow-up plan:   Return for (ECT): (B) L-FCT Blk #4 (first of 2024).      Interventional Therapies  Risk Factors  Complex Considerations:   Allergy: LATEX  Hx Hepatitis C (w/ cirrhosis)   Planned  Pending:   Therapeutic bilateral lumbar facet MBB #4 (first of 2024)   Under consideration:   Diagnostic/therapeutic left cervical ESI #2  Therapeutic left caudal ESI #2  Therapeutic right cervical ESI #3  Therapeutic right Racz procedure #1  Diagnostic right suprascapular NB #1  Therapeutic bilateral lumbar facet RFA #1 (starting on left side)  Therapeutic bilateral cervical facet RFA #1 (starting on left side)    Completed:   Diagnostic/therapeutic left L4-5 LESI x1 (06/14/2024) (100/100/90/90)  Therapeutic right glenohumeral/AC  inj. x4 (12/08/2021) (100/100/100/100)  Therapeutic bilateral lumbar facet MBB x3 (06/03/2019) (100/100/90/90)  Diagnostic bilateral cervical facet MBB x1 (11/06/2017) (100/100/90)  Diagnostic left caudal ESI x1 (08/14/2019) (100/100/100 x8 days/90-100)  Therapeutic right cervical ESI x3 (04/12/2021) (100/100/80-95/80-95)  Therapeutic left cervical ESI x1 (07/19/2021)  (100/100/100/100) (helped neck, shoulders, headache, and arms)    Therapeutic  Palliative (PRN) options:   Therapeutic/palliative glenohumeral/AC  inj.   Therapeutic/palliative lumbar facet MBB   Therapeutic/palliative cervical facet MBB   Therapeutic/palliative caudal ESI   Therapeutic/palliative cervical ESI    Pharmacotherapy:  Nonopioids transfer 10/27/2019: Benefiber, Amitiza, magnesium, Mobic, and Neurontin       Recent Visits No visits were found meeting these conditions. Showing recent visits within past 90 days and meeting all other requirements Today's Visits Date Type Provider Dept  01/08/23 Office Visit Delano Metz, MD [facility]  Showing today's visits and meeting all other requirements Future Appointments Date Type Provider Dept  02/21/23 Appointment Delano Metz, MD [facility]  Showing future appointments within next 90 days and meeting all other requirements  I discussed the assessment and treatment plan with the patient. The patient was provided an opportunity to ask questions and all were answered. The patient agreed with the plan and demonstrated an understanding of the instructions.  Patient advised to call back or seek an in-person evaluation if the symptoms or condition worsens.  Duration of encounter: 30 minutes.  Total time on encounter, as per AMA guidelines included both the face-to-face and non-face-to-face time personally spent by the physician and/or other qualified health care professional(s) on the day of the encounter (includes time in activities that require the physician or other qualified health care professional and does not include time in activities normally performed by clinical staff). Physician's time may include the following activities when performed: Preparing to see the patient (e.g., pre-charting review of records, searching for previously ordered imaging, lab work, and nerve conduction tests) Review  of prior analgesic pharmacotherapies. Reviewing PMP Interpreting ordered tests (e.g., lab work, imaging, nerve conduction tests) Performing post-procedure evaluations, including interpretation of diagnostic procedures Obtaining and/or reviewing separately obtained history Performing a medically appropriate examination and/or evaluation Counseling and educating the patient/family/caregiver Ordering medications, tests, or procedures Referring and communicating with other health care professionals (when not separately reported) Documenting clinical information in the electronic or other health record Independently interpreting results (not separately reported) and communicating results to the patient/ family/caregiver Care coordination (not separately reported)  Note by: Oswaldo Done,  MD Date: 01/08/2023; Time: 9:07 AM

## 2023-01-08 ENCOUNTER — Ambulatory Visit: Payer: 59 | Attending: Pain Medicine | Admitting: Pain Medicine

## 2023-01-08 ENCOUNTER — Other Ambulatory Visit: Payer: Self-pay | Admitting: Pain Medicine

## 2023-01-08 VITALS — BP 135/65 | HR 89 | Temp 97.3°F | Ht 65.0 in | Wt 197.0 lb

## 2023-01-08 DIAGNOSIS — G894 Chronic pain syndrome: Secondary | ICD-10-CM

## 2023-01-08 DIAGNOSIS — Z79899 Other long term (current) drug therapy: Secondary | ICD-10-CM | POA: Diagnosis not present

## 2023-01-08 DIAGNOSIS — M545 Low back pain, unspecified: Secondary | ICD-10-CM | POA: Insufficient documentation

## 2023-01-08 DIAGNOSIS — M79605 Pain in left leg: Secondary | ICD-10-CM | POA: Diagnosis not present

## 2023-01-08 DIAGNOSIS — M549 Dorsalgia, unspecified: Secondary | ICD-10-CM | POA: Diagnosis not present

## 2023-01-08 DIAGNOSIS — Z79891 Long term (current) use of opiate analgesic: Secondary | ICD-10-CM

## 2023-01-08 DIAGNOSIS — G8929 Other chronic pain: Secondary | ICD-10-CM | POA: Diagnosis not present

## 2023-01-08 DIAGNOSIS — M5442 Lumbago with sciatica, left side: Secondary | ICD-10-CM | POA: Diagnosis not present

## 2023-01-08 DIAGNOSIS — M5459 Other low back pain: Secondary | ICD-10-CM | POA: Diagnosis not present

## 2023-01-08 DIAGNOSIS — M542 Cervicalgia: Secondary | ICD-10-CM | POA: Insufficient documentation

## 2023-01-08 DIAGNOSIS — M47816 Spondylosis without myelopathy or radiculopathy, lumbar region: Secondary | ICD-10-CM | POA: Diagnosis not present

## 2023-01-08 MED ORDER — TRAMADOL HCL 50 MG PO TABS: 50.0000 mg | ORAL_TABLET | Freq: Four times a day (QID) | ORAL | 0 refills | Status: DC | PRN

## 2023-01-08 MED ORDER — NALOXONE HCL 4 MG/0.1ML NA LIQD: 1.0000 | NASAL | 1 refills | Status: AC | PRN

## 2023-01-08 NOTE — Progress Notes (Signed)
Nursing Pain Medication Assessment:  Safety precautions to be maintained throughout the outpatient stay will include: orient to surroundings, keep bed in low position, maintain call bell within reach at all times, provide assistance with transfer out of bed and ambulation.   Medication Inspection Compliance: Rachael West did not comply with our request to bring her pills to be counted. She was reminded that bringing the medication bottles, even when empty, is a requirement.  Medication: None brought in. Pill/Patch Count: None available to be counted. Bottle Appearance: No container available. Did not bring bottle(s) to appointment. Filled Date: N/A Last Medication intake:  Yesterday. States she filled Tramadol 12/26/22.

## 2023-01-20 ENCOUNTER — Other Ambulatory Visit: Payer: Self-pay | Admitting: Pain Medicine

## 2023-01-20 DIAGNOSIS — G894 Chronic pain syndrome: Secondary | ICD-10-CM

## 2023-01-20 DIAGNOSIS — Z79891 Long term (current) use of opiate analgesic: Secondary | ICD-10-CM

## 2023-01-20 DIAGNOSIS — Z79899 Other long term (current) drug therapy: Secondary | ICD-10-CM

## 2023-01-20 DIAGNOSIS — G8929 Other chronic pain: Secondary | ICD-10-CM

## 2023-01-23 ENCOUNTER — Encounter: Payer: Self-pay | Admitting: Pain Medicine

## 2023-01-23 ENCOUNTER — Ambulatory Visit: Payer: 59 | Attending: Pain Medicine | Admitting: Pain Medicine

## 2023-01-23 ENCOUNTER — Ambulatory Visit
Admission: RE | Admit: 2023-01-23 | Discharge: 2023-01-23 | Disposition: A | Discharge status: Home / Self Care | Payer: 59 | Source: Ambulatory Visit | Attending: Pain Medicine | Admitting: Pain Medicine

## 2023-01-23 VITALS — BP 131/82 | HR 83 | Temp 98.2°F | Resp 16 | Ht 65.0 in | Wt 198.0 lb

## 2023-01-23 DIAGNOSIS — Z5189 Encounter for other specified aftercare: Secondary | ICD-10-CM | POA: Diagnosis not present

## 2023-01-23 DIAGNOSIS — M5459 Other low back pain: Secondary | ICD-10-CM | POA: Insufficient documentation

## 2023-01-23 DIAGNOSIS — M545 Low back pain, unspecified: Secondary | ICD-10-CM | POA: Diagnosis not present

## 2023-01-23 DIAGNOSIS — M5136 Other intervertebral disc degeneration, lumbar region with discogenic back pain only: Secondary | ICD-10-CM | POA: Diagnosis not present

## 2023-01-23 DIAGNOSIS — M47816 Spondylosis without myelopathy or radiculopathy, lumbar region: Secondary | ICD-10-CM | POA: Insufficient documentation

## 2023-01-23 DIAGNOSIS — G8929 Other chronic pain: Secondary | ICD-10-CM | POA: Insufficient documentation

## 2023-01-23 MED ORDER — LIDOCAINE HCL 2 % IJ SOLN
20.0000 mL | Freq: Once | INTRAMUSCULAR | Status: AC
Start: 2023-01-23 — End: 2023-01-23
  Administered 2023-01-23: 400 mg

## 2023-01-23 MED ORDER — MIDAZOLAM HCL 5 MG/5ML IJ SOLN
0.5000 mg | Freq: Once | INTRAMUSCULAR | Status: AC
  Administered 2023-01-23: 2 mg via INTRAVENOUS

## 2023-01-23 MED ORDER — TRIAMCINOLONE ACETONIDE 40 MG/ML IJ SUSP
80.0000 mg | Freq: Once | INTRAMUSCULAR | Status: AC
  Administered 2023-01-23: 80 mg

## 2023-01-23 MED ORDER — FENTANYL CITRATE (PF) 100 MCG/2ML IJ SOLN
INTRAMUSCULAR | Status: AC
  Filled 2023-01-23: qty 2

## 2023-01-23 MED ORDER — ROPIVACAINE HCL 2 MG/ML IJ SOLN
INTRAMUSCULAR | Status: AC
  Filled 2023-01-23: qty 20

## 2023-01-23 MED ORDER — PENTAFLUOROPROP-TETRAFLUOROETH EX AERO
INHALATION_SPRAY | Freq: Once | CUTANEOUS | Status: DC
Start: 2023-01-23 — End: 2023-01-23

## 2023-01-23 MED ORDER — LIDOCAINE HCL 2 % IJ SOLN
INTRAMUSCULAR | Status: AC
  Filled 2023-01-23: qty 20

## 2023-01-23 MED ORDER — TRIAMCINOLONE ACETONIDE 40 MG/ML IJ SUSP
INTRAMUSCULAR | Status: AC
Start: 2023-01-23 — End: ?
  Filled 2023-01-23: qty 2

## 2023-01-23 MED ORDER — MIDAZOLAM HCL 5 MG/5ML IJ SOLN
INTRAMUSCULAR | Status: AC
  Filled 2023-01-23: qty 5

## 2023-01-23 MED ORDER — FENTANYL CITRATE (PF) 100 MCG/2ML IJ SOLN
25.0000 ug | INTRAMUSCULAR | Status: DC | PRN
  Administered 2023-01-23: 50 ug via INTRAVENOUS

## 2023-01-23 MED ORDER — MIDAZOLAM HCL 2 MG/2ML IJ SOLN: 0.5000 mg | Freq: Once | INTRAMUSCULAR | Status: DC

## 2023-01-23 MED ORDER — ROPIVACAINE HCL 2 MG/ML IJ SOLN
18.0000 mL | Freq: Once | INTRAMUSCULAR | Status: AC
  Administered 2023-01-23: 18 mL via PERINEURAL

## 2023-01-23 NOTE — Progress Notes (Signed)
 Safety precautions to be maintained throughout the outpatient stay will include: orient to surroundings, keep bed in low position, maintain call bell within reach at all times, provide assistance with transfer out of bed and ambulation.

## 2023-01-23 NOTE — Patient Instructions (Signed)

## 2023-01-23 NOTE — Progress Notes (Signed)
 PROVIDER NOTE: Interpretation of information contained herein should be left to medically-trained personnel. Specific patient instructions are provided elsewhere under Patient Instructions section of medical record. This document was created in part using STT-dictation technology, any transcriptional errors that may result from this process are unintentional.  Patient: Rachael West Type: Established DOB: 04-03-1955 MRN: [id number] PCP: Wallace Joesph LABOR, PA  Service: Procedure DOS: 01/23/2023 Setting: Ambulatory Location: Ambulatory outpatient facility Delivery: Face-to-face Provider: Eric LABOR Como, MD Specialty: Interventional Pain Management Specialty designation: 09 Location: Outpatient facility Ref. Prov.: Como Eric, MD       Interventional Therapy   Type: Lumbar Facet, Medial Branch Block(s) (w/ fluoroscopic mapping) #4  Laterality: Bilateral  Level: L2, L3, L4, L5, and S1 Medial Branch Level(s). Injecting these levels blocks the L3-4, L4-5, and L5-S1 lumbar facet joints. Imaging: Fluoroscopic guidance Spinal ([id number]) Anesthesia: Local anesthesia (1-2% Lidocaine ) Anxiolysis: IV Versed  2.0 mg Sedation: Moderate Sedation Fentanyl  1 mL (50 mcg) DOS: 01/23/2023 Performed by: Eric LABOR Como, MD  Primary Purpose: Diagnostic/Therapeutic Indications: Low back pain severe enough to impact quality of life or function. 1. Chronic low back pain (1ry area of Pain) (Bilateral) (L>R) w/o sciatica   2. Lumbar facet joint pain   3. Lumbar facet syndrome (Bilateral) (L>R)   4. Lumbar facet arthropathy   5. Spondylosis without myelopathy or radiculopathy, lumbar region   6. Degeneration of intervertebral disc of lumbar region with discogenic back pain   7. Encounter for therapeutic procedure    NAS-11 Pain score:   Pre-procedure: 7 /10   Post-procedure: 0-No pain/10     Position / Prep / Materials:  Position: Prone  Prep solution: ChloraPrep (2%  chlorhexidine gluconate and 70% isopropyl alcohol) Area Prepped: Posterolateral Lumbosacral Spine (Wide prep: From the lower border of the scapula down to the end of the tailbone and from flank to flank.)  Materials:  Tray: Block Needle(s):  Type: Spinal  Gauge (G): 22  Length: 5-in Qty: 4      H&P (Pre-op Assessment):  Rachael West is a 68 y.o. (year old), female patient, seen today for interventional treatment. She  has a past surgical history that includes Appendectomy; Trigger finger release; Esophagogastroduodenoscopy (01/17/2011); Colonoscopy (01/17/2011); Abdominal hysterectomy; and Breast biopsy (Left, 2015). Rachael West has a current medication list which includes the following prescription(s): albuterol , atenolol , b complex vitamins, buspirone , dimenhydrinate , gabapentin , hydroxyzine , ipratropium-albuterol , culturelle, latanoprost , lisinopril , lubiprostone , magnesium  oxide -mg supplement, meloxicam , mirabegron  er, montelukast , naloxone , omeprazole , ondansetron , paliperidone , polyethylene glycol, prazosin, sertraline , sucralfate , [START ON 01/25/2023] tramadol , trazodone, wixela inhub , and benefiber, and the following Facility-Administered Medications: fentanyl , midazolam , and pentafluoroprop-tetrafluoroeth. Her primarily concern today is the Back Pain (lower)  Initial Vital Signs:  Pulse/HCG Rate: 83ECG Heart Rate: 67 (nsr) Temp: 98.2 F (36.8 C) Resp: 18 BP: 129/64 SpO2: 99 %  BMI: Estimated body mass index is 32.95 kg/m as calculated from the following:   Height as of this encounter: 5' 5 (1.651 m).   Weight as of this encounter: 198 lb (89.8 kg).  Risk Assessment: Allergies: Reviewed. She is allergic to latex, penicillins, codeine, cortisone, and sulfa antibiotics.  Allergy Precautions: None required Coagulopathies: Reviewed. None identified.  Blood-thinner therapy: None at this time Active Infection(s): Reviewed. None identified. Rachael West is  afebrile  Site Confirmation: Rachael West was asked to confirm the procedure and laterality before marking the site Procedure checklist: Completed Consent: Before the procedure and under the influence of no sedative(s), amnesic(s), or anxiolytics, the patient was informed of the treatment options,  risks and possible complications. To fulfill our ethical and legal obligations, as recommended by the American Medical Association's Code of Ethics, I have informed the patient of my clinical impression; the nature and purpose of the treatment or procedure; the risks, benefits, and possible complications of the intervention; the alternatives, including doing nothing; the risk(s) and benefit(s) of the alternative treatment(s) or procedure(s); and the risk(s) and benefit(s) of doing nothing. The patient was provided information about the general risks and possible complications associated with the procedure. These may include, but are not limited to: failure to achieve desired goals, infection, bleeding, organ or nerve damage, allergic reactions, paralysis, and death. In addition, the patient was informed of those risks and complications associated to Spine-related procedures, such as failure to decrease pain; infection (i.e.: Meningitis, epidural or intraspinal abscess); bleeding (i.e.: epidural hematoma, subarachnoid hemorrhage, or any other type of intraspinal or peri-dural bleeding); organ or nerve damage (i.e.: Any type of peripheral nerve, nerve root, or spinal cord injury) with subsequent damage to sensory, motor, and/or autonomic systems, resulting in permanent pain, numbness, and/or weakness of one or several areas of the body; allergic reactions; (i.e.: anaphylactic reaction); and/or death. Furthermore, the patient was informed of those risks and complications associated with the medications. These include, but are not limited to: allergic reactions (i.e.: anaphylactic or anaphylactoid reaction(s));  adrenal axis suppression; blood sugar elevation that in diabetics may result in ketoacidosis or comma; water retention that in patients with history of congestive heart failure may result in shortness of breath, pulmonary edema, and decompensation with resultant heart failure; weight gain; swelling or edema; medication-induced neural toxicity; particulate matter embolism and blood vessel occlusion with resultant organ, and/or nervous system infarction; and/or aseptic necrosis of one or more joints. Finally, the patient was informed that Medicine is not an exact science; therefore, there is also the possibility of unforeseen or unpredictable risks and/or possible complications that may result in a catastrophic outcome. The patient indicated having understood very clearly. We have given the patient no guarantees and we have made no promises. Enough time was given to the patient to ask questions, all of which were answered to the patient's satisfaction. Rachael West has indicated that she wanted to continue with the procedure. Attestation: I, the ordering provider, attest that I have discussed with the patient the benefits, risks, side-effects, alternatives, likelihood of achieving goals, and potential problems during recovery for the procedure that I have provided informed consent. Date  Time: 01/23/2023  9:10 AM   Pre-Procedure Preparation:  Monitoring: As per clinic protocol. Respiration, ETCO2, SpO2, BP, heart rate and rhythm monitor placed and checked for adequate function Safety Precautions: Patient was assessed for positional comfort and pressure points before starting the procedure. Time-out: I initiated and conducted the Time-out before starting the procedure, as per protocol. The patient was asked to participate by confirming the accuracy of the Time Out information. Verification of the correct person, site, and procedure were performed and confirmed by me, the nursing staff, and the  patient. Time-out conducted as per Joint Commission's Universal Protocol (UP.01.01.01). Time: 1047 Start Time: 1047 hrs.  Description of Procedure:          Laterality: (see above) Targeted Levels: (see above)  Safety Precautions: Aspiration looking for blood return was conducted prior to all injections. At no point did we inject any substances, as a needle was being advanced. Before injecting, the patient was told to immediately notify me if she was experiencing any new onset of ringing in  the ears, or metallic taste in the mouth. No attempts were made at seeking any paresthesias. Safe injection practices and needle disposal techniques used. Medications properly checked for expiration dates. SDV (single dose vial) medications used. After the completion of the procedure, all disposable equipment used was discarded in the proper designated medical waste containers. Local Anesthesia: Protocol guidelines were followed. The patient was positioned over the fluoroscopy table. The area was prepped in the usual manner. The time-out was completed. The target area was identified using fluoroscopy. A 12-in long, straight, sterile hemostat was used with fluoroscopic guidance to locate the targets for each level blocked. Once located, the skin was marked with an approved surgical skin marker. Once all sites were marked, the skin (epidermis, dermis, and hypodermis), as well as deeper tissues (fat, connective tissue and muscle) were infiltrated with a small amount of a short-acting local anesthetic, loaded on a 10cc syringe with a 25G, 1.5-in  Needle. An appropriate amount of time was allowed for local anesthetics to take effect before proceeding to the next step. Local Anesthetic: Lidocaine  2.0% The unused portion of the local anesthetic was discarded in the proper designated containers. Technical description of process:  L2 Medial Branch Nerve Block (MBB): The target area for the L2 medial branch is at the  junction of the postero-lateral aspect of the superior articular process and the superior, posterior, and medial edge of the transverse process of L3. Under fluoroscopic guidance, a Quincke needle was inserted until contact was made with os over the superior postero-lateral aspect of the pedicular shadow (target area). After negative aspiration for blood, 0.5 mL of the nerve block solution was injected without difficulty or complication. The needle was removed intact. L3 Medial Branch Nerve Block (MBB): The target area for the L3 medial branch is at the junction of the postero-lateral aspect of the superior articular process and the superior, posterior, and medial edge of the transverse process of L4. Under fluoroscopic guidance, a Quincke needle was inserted until contact was made with os over the superior postero-lateral aspect of the pedicular shadow (target area). After negative aspiration for blood, 0.5 mL of the nerve block solution was injected without difficulty or complication. The needle was removed intact. L4 Medial Branch Nerve Block (MBB): The target area for the L4 medial branch is at the junction of the postero-lateral aspect of the superior articular process and the superior, posterior, and medial edge of the transverse process of L5. Under fluoroscopic guidance, a Quincke needle was inserted until contact was made with os over the superior postero-lateral aspect of the pedicular shadow (target area). After negative aspiration for blood, 0.5 mL of the nerve block solution was injected without difficulty or complication. The needle was removed intact. L5 Medial Branch Nerve Block (MBB): The target area for the L5 medial branch is at the junction of the postero-lateral aspect of the superior articular process and the superior, posterior, and medial edge of the sacral ala. Under fluoroscopic guidance, a Quincke needle was inserted until contact was made with os over the superior postero-lateral  aspect of the pedicular shadow (target area). After negative aspiration for blood, 0.5 mL of the nerve block solution was injected without difficulty or complication. The needle was removed intact. S1 Medial Branch Nerve Block (MBB): The target area for the S1 medial branch is at the posterior and inferior 6 o'clock position of the L5-S1 facet joint. Under fluoroscopic guidance, the Quincke needle inserted for the L5 MBB was redirected until contact was  made with os over the inferior and postero aspect of the sacrum, at the 6 o' clock position under the L5-S1 facet joint (Target area). After negative aspiration for blood, 0.5 mL of the nerve block solution was injected without difficulty or complication. The needle was removed intact.  Once the entire procedure was completed, the treated area was cleaned, making sure to leave some of the prepping solution back to take advantage of its long term bactericidal properties.         Illustration of the posterior view of the lumbar spine and the posterior neural structures. Laminae of L2 through S1 are labeled. DPRL5, dorsal primary ramus of L5; DPRS1, dorsal primary ramus of S1; DPR3, dorsal primary ramus of L3; FJ, facet (zygapophyseal) joint L3-L4; I, inferior articular process of L4; LB1, lateral branch of dorsal primary ramus of L1; IAB, inferior articular branches from L3 medial branch (supplies L4-L5 facet joint); IBP, intermediate branch plexus; MB3, medial branch of dorsal primary ramus of L3; NR3, third lumbar nerve root; S, superior articular process of L5; SAB, superior articular branches from L4 (supplies L4-5 facet joint also); TP3, transverse process of L3.   Facet Joint Innervation (* possible contribution)  L1-2 T12, L1 (L2*)  Medial Branch  L2-3 L1, L2 (L3*)                     L3-4 L2, L3 (L4*)                     L4-5 L3, L4 (L5*)                     L5-S1 L4, L5, S1                        Vitals:   01/23/23 1052 01/23/23  1056 01/23/23 1106 01/23/23 1116  BP: (!) 157/92 133/86 130/68 131/82  Pulse:      Resp: 17 18 16 16   Temp:      SpO2: 100% 100% 99% 98%  Weight:      Height:         End Time: 1055 hrs.  Imaging Guidance (Spinal):          Type of Imaging Technique: Fluoroscopy Guidance (Spinal) Indication(s): Fluoroscopy guidance for needle placement to enhance accuracy in procedures requiring precise needle localization for targeted delivery of medication in or near specific anatomical locations not easily accessible without such real-time imaging assistance. Exposure Time: Please see nurses notes. Contrast: None used. Fluoroscopic Guidance: I was personally present during the use of fluoroscopy. Tunnel Vision Technique used to obtain the best possible view of the target area. Parallax error corrected before commencing the procedure. Direction-depth-direction technique used to introduce the needle under continuous pulsed fluoroscopy. Once target was reached, antero-posterior, oblique, and lateral fluoroscopic projection used confirm needle placement in all planes. Images permanently stored in EMR. Interpretation: No contrast injected. I personally interpreted the imaging intraoperatively. Adequate needle placement confirmed in multiple planes. Permanent images saved into the patient's record.  Post-operative Assessment:  Post-procedure Vital Signs:  Pulse/HCG Rate: 8385 Temp: 98.2 F (36.8 C) Resp: 16 BP: 131/82 SpO2: 98 %  EBL: None  Complications: No immediate post-treatment complications observed by team, or reported by patient.  Note: The patient tolerated the entire procedure well. A repeat set of vitals were taken after the procedure and the patient was kept under observation following institutional policy, for this type  of procedure. Post-procedural neurological assessment was performed, showing return to baseline, prior to discharge. The patient was provided with post-procedure  discharge instructions, including a section on how to identify potential problems. Should any problems arise concerning this procedure, the patient was given instructions to immediately contact us , at any time, without hesitation. In any case, we plan to contact the patient by telephone for a follow-up status report regarding this interventional procedure.  Comments:  No additional relevant information.  Plan of Care (POC)  Orders:  Orders Placed This Encounter  Procedures   LUMBAR FACET(MEDIAL BRANCH NERVE BLOCK) MBNB    Scheduling Instructions:     Procedure: Lumbar facet block (AKA.: Lumbosacral medial branch nerve block)     Side: Bilateral     Level: L3-4, L4-5, L5-S1, and TBD Facets (L2, L3, L4, L5, S1, and TBD Medial Branch Nerves)     Sedation: Patient's choice.     Timeframe: Today    Where will this procedure be performed?:   [facility]   [facility] C-ARM 1-60 MIN NO REPORT    Intraoperative interpretation by procedural physician at [facility].    Standing Status:   Standing    Number of Occurrences:   1    Reason for exam::   Assistance in needle guidance and placement for procedures requiring needle placement in or near specific anatomical locations not easily accessible without such assistance.   Informed Consent Details: Physician/Practitioner Attestation; Transcribe to consent form and obtain patient signature    Nursing Order: Transcribe to consent form and obtain patient signature. Note: Always confirm laterality of pain with Rachael West, before procedure.    Physician/Practitioner attestation of informed consent for procedure/surgical case:   I, the physician/practitioner, attest that I have discussed with the patient the benefits, risks, side effects, alternatives, likelihood of achieving goals and potential problems during recovery for the procedure that I have provided informed consent.    Procedure:   Lumbar Facet Block  under  fluoroscopic guidance    Physician/Practitioner performing the procedure:   Lynsi Dooner A. Tanya MD    Indication/Reason:   Low Back Pain, with our without leg pain, due to Facet Joint Arthralgia (Joint Pain) Spondylosis (Arthritis of the Spine), without myelopathy or radiculopathy (Nerve Damage).   Provide equipment / supplies at bedside    Procedure tray: Block Tray (Disposable  single use) Skin infiltration needle: Regular 1.5-in, 25-G, (x1) Block Needle type: Spinal Amount/quantity: 4 Size: Medium (5-inch) Gauge: 22G    Standing Status:   Standing    Number of Occurrences:   1    Specify:   Block Tray   Saline lock IV    Have LR [phone] mL available and administer at 125 mL/hr if patient becomes hypotensive.    Standing Status:   Standing    Number of Occurrences:   1   Latex precautions    Activate Latex-Free Protocol.    Standing Status:   Standing    Number of Occurrences:   1   Chronic Opioid Analgesic:   Tramadol  50 mg, 1-2 tabs (50-100 mg) PO q 6 hrs (400 mg/day of tramadol ) MME/day: 40 mg/day.   Medications ordered for procedure: Meds ordered this encounter  Medications   lidocaine  (XYLOCAINE ) 2 % (with pres) injection 400 mg   pentafluoroprop-tetrafluoroeth (GEBAUERS) aerosol   midazolam  (VERSED ) injection 0.5-2 mg    Make sure Flumazenil is available in the pyxis when using this medication. If oversedation occurs, administer 0.2 mg  IV over 15 sec. If after 45 sec no response, administer 0.2 mg again over 1 min; may repeat at 1 min intervals; not to exceed 4 doses (1 mg)   midazolam  (VERSED ) 5 MG/5ML injection 0.5-2 mg    Make sure Flumazenil is available in the pyxis when using this medication. If oversedation occurs, administer 0.2 mg IV over 15 sec. If after 45 sec no response, administer 0.2 mg again over 1 min; may repeat at 1 min intervals; not to exceed 4 doses (1 mg)   fentaNYL  (SUBLIMAZE ) injection 25-50 mcg    Make sure Narcan  is available in the pyxis  when using this medication. In the event of respiratory depression (RR< 8/min): Titrate NARCAN  (naloxone ) in increments of 0.1 to 0.2 mg IV at 2-3 minute intervals, until desired degree of reversal.   ropivacaine  (PF) 2 mg/mL (0.2%) (NAROPIN ) injection 18 mL   triamcinolone  acetonide (KENALOG -40) injection 80 mg   Medications administered: We administered lidocaine , midazolam , fentaNYL , ropivacaine  (PF) 2 mg/mL (0.2%), and triamcinolone  acetonide.  See the medical record for exact dosing, route, and time of administration.  Follow-up plan:   Return in about 2 weeks (around 02/06/2023) for (Face2F), (PPE).       Interventional Therapies  Risk Factors  Complex Considerations:   Allergy: LATEX  Hx Hepatitis C (w/ cirrhosis)   Planned  Pending:   Therapeutic bilateral lumbar facet MBB #4 (first of 2024)   Under consideration:   Diagnostic/therapeutic left cervical ESI #2  Therapeutic left caudal ESI #2  Therapeutic right cervical ESI #3  Therapeutic right Racz procedure #1  Diagnostic right suprascapular NB #1  Therapeutic bilateral lumbar facet RFA #1 (starting on left side)  Therapeutic bilateral cervical facet RFA #1 (starting on left side)    Completed:   Diagnostic/therapeutic left L4-5 LESI x1 (06/14/2024) (100/100/90/90)  Therapeutic right glenohumeral/AC  inj. x4 (12/08/2021) (100/100/100/100)  Therapeutic bilateral lumbar facet MBB x3 (06/03/2019) (100/100/90/90)  Diagnostic bilateral cervical facet MBB x1 (11/06/2017) (100/100/90)  Diagnostic left caudal ESI x1 (08/14/2019) (100/100/100 x8 days/90-100)  Therapeutic right cervical ESI x3 (04/12/2021) (100/100/80-95/80-95)  Therapeutic left cervical ESI x1 (07/19/2021) (100/100/100/100) (helped neck, shoulders, headache, and arms)    Therapeutic  Palliative (PRN) options:   Therapeutic/palliative glenohumeral/AC  inj.   Therapeutic/palliative lumbar facet MBB   Therapeutic/palliative cervical facet MBB    Therapeutic/palliative caudal ESI   Therapeutic/palliative cervical ESI    Pharmacotherapy:  Nonopioids transfer 10/27/2019: Benefiber, Amitiza , magnesium , Mobic , and Neurontin        Recent Visits Date Type Provider Dept  01/08/23 Office Visit Tanya Glisson, MD [facility]  Showing recent visits within past 90 days and meeting all other requirements Today's Visits Date Type Provider Dept  01/23/23 Procedure visit Tanya Glisson, MD [facility]  Showing today's visits and meeting all other requirements Future Appointments Date Type Provider Dept  02/06/23 Appointment Tanya Glisson, MD [facility]  02/21/23 Appointment Tanya Glisson, MD [facility]  Showing future appointments within next 90 days and meeting all other requirements  Disposition: Discharge home  Discharge (Date  Time): 01/23/2023; 1120 hrs.   Primary Care Physician: Wallace Joesph LABOR, PA Location: [facility] Note by: Glisson LABOR Tanya, MD (TTS technology used. I apologize for any typographical errors that were not detected and corrected.) Date: 01/23/2023; Time: 11:22 AM  Disclaimer:  Medicine is not an visual merchandiser. The only guarantee in medicine is that nothing is guaranteed. It is important to note that  the decision to proceed with this intervention was based on the information collected from the patient. The Data and conclusions were drawn from the patient's questionnaire, the interview, and the physical examination. Because the information was provided in large part by the patient, it cannot be guaranteed that it has not been purposely or unconsciously manipulated. Every effort has been made to obtain as much relevant data as possible for this evaluation. It is important to note that the conclusions that lead to this procedure are derived in large part from the available data. Always take into account that the treatment will  also be dependent on availability of resources and existing treatment guidelines, considered by other Pain Management Practitioners as being common knowledge and practice, at the time of the intervention. For Medico-Legal purposes, it is also important to point out that variation in procedural techniques and pharmacological choices are the acceptable norm. The indications, contraindications, technique, and results of the above procedure should only be interpreted and judged by a Board-Certified Interventional Pain Specialist with extensive familiarity and expertise in the same exact procedure and technique.

## 2023-01-24 ENCOUNTER — Telehealth: Payer: Self-pay

## 2023-01-24 NOTE — Telephone Encounter (Signed)
 Called PP.No answer, No VM

## 2023-02-06 ENCOUNTER — Ambulatory Visit: Payer: 59 | Admitting: Pain Medicine

## 2023-02-06 ENCOUNTER — Other Ambulatory Visit (HOSPITAL_COMMUNITY): Payer: Self-pay | Admitting: Gastroenterology

## 2023-02-06 DIAGNOSIS — K219 Gastro-esophageal reflux disease without esophagitis: Secondary | ICD-10-CM | POA: Diagnosis not present

## 2023-02-06 DIAGNOSIS — K5909 Other constipation: Secondary | ICD-10-CM | POA: Diagnosis not present

## 2023-02-06 DIAGNOSIS — Z800 Family history of malignant neoplasm of digestive organs: Secondary | ICD-10-CM | POA: Diagnosis not present

## 2023-02-06 DIAGNOSIS — R110 Nausea: Secondary | ICD-10-CM | POA: Diagnosis not present

## 2023-02-06 DIAGNOSIS — Z860101 Personal history of adenomatous and serrated colon polyps: Secondary | ICD-10-CM | POA: Diagnosis not present

## 2023-02-08 ENCOUNTER — Ambulatory Visit: Payer: 59 | Attending: Pain Medicine | Admitting: Pain Medicine

## 2023-02-08 VITALS — BP 140/76 | HR 82 | Temp 96.4°F | Resp 16 | Ht 65.0 in | Wt 200.8 lb

## 2023-02-08 DIAGNOSIS — M549 Dorsalgia, unspecified: Secondary | ICD-10-CM | POA: Insufficient documentation

## 2023-02-08 DIAGNOSIS — Z79899 Other long term (current) drug therapy: Secondary | ICD-10-CM | POA: Diagnosis not present

## 2023-02-08 DIAGNOSIS — M5442 Lumbago with sciatica, left side: Secondary | ICD-10-CM | POA: Insufficient documentation

## 2023-02-08 DIAGNOSIS — Z79891 Long term (current) use of opiate analgesic: Secondary | ICD-10-CM | POA: Diagnosis not present

## 2023-02-08 DIAGNOSIS — Z09 Encounter for follow-up examination after completed treatment for conditions other than malignant neoplasm: Secondary | ICD-10-CM | POA: Diagnosis not present

## 2023-02-08 DIAGNOSIS — M5459 Other low back pain: Secondary | ICD-10-CM | POA: Insufficient documentation

## 2023-02-08 DIAGNOSIS — G894 Chronic pain syndrome: Secondary | ICD-10-CM | POA: Diagnosis not present

## 2023-02-08 DIAGNOSIS — M79605 Pain in left leg: Secondary | ICD-10-CM | POA: Insufficient documentation

## 2023-02-08 DIAGNOSIS — M545 Low back pain, unspecified: Secondary | ICD-10-CM | POA: Diagnosis not present

## 2023-02-08 DIAGNOSIS — G8929 Other chronic pain: Secondary | ICD-10-CM | POA: Diagnosis not present

## 2023-02-08 DIAGNOSIS — M542 Cervicalgia: Secondary | ICD-10-CM | POA: Insufficient documentation

## 2023-02-08 DIAGNOSIS — M47816 Spondylosis without myelopathy or radiculopathy, lumbar region: Secondary | ICD-10-CM | POA: Diagnosis not present

## 2023-02-08 MED ORDER — TRAMADOL HCL 50 MG PO TABS: 50.0000 mg | ORAL_TABLET | Freq: Four times a day (QID) | ORAL | 5 refills | Status: DC | PRN

## 2023-02-08 NOTE — Progress Notes (Signed)
Safety precautions to be maintained throughout the outpatient stay will include: orient to surroundings, keep bed in low position, maintain call bell within reach at all times, provide assistance with transfer out of bed and ambulation.

## 2023-02-08 NOTE — Progress Notes (Signed)
PROVIDER NOTE: Information contained herein reflects review and annotations entered in association with encounter. Interpretation of such information and data should be left to medically-trained personnel. Information provided to patient can be located elsewhere in the medical record under "Patient Instructions". Document created using STT-dictation technology, any transcriptional errors that may result from process are unintentional.    Patient: Rachael West  Service Category: E/M  Provider: Oswaldo Done, MD  DOB: 29-Jun-1955  DOS: 02/08/2023  Referring Provider: Melida Quitter, PA  MRN: [id number]  Specialty: Interventional Pain Management  PCP: Melida Quitter, PA  Type: Established Patient  Setting: Ambulatory outpatient    Location: Office  Delivery: Face-to-face     HPI  [name], a 68 y.o. year old female, is here today because of her Chronic bilateral low back pain without sciatica [M54.50, G89.29]. Ms. Leeb primary complain today is Back Pain  Pertinent problems: Ms. Cisse has Chronic low back pain (1ry area of Pain) (Bilateral) w/ sciatica (Left); Chronic lower extremity pain (2ry area of Pain) (Left); Chronic upper back pain (3ry area of Pain) (Bilateral) (L>R); Chronic neck pain (4th area of Pain) (Bilateral) (L>R); Occipital headache; Chronic knee pain (Bilateral) (R>L); Chronic pain syndrome; Chronic sacroiliac joint pain; DDD (degenerative disc disease), lumbar; Lumbar facet arthropathy; Lumbar facet syndrome (Bilateral) (L>R); Spondylosis without myelopathy or radiculopathy, lumbar region; DDD (degenerative disc disease), thoracic; Thoracolumbar dextroscoliosis; Cervicogenic headache; Tendinitis of rotator cuff (Left); Tendinitis of rotator cuff (Right); Fibromyalgia; Chronic shoulder pain (Bilateral) (R>L); Chronic hip pain (Bilateral) (L>R); Cervicalgia; Chronic idiopathic gout; DDD (degenerative disc disease), cervical; 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); Cervical facet syndrome (Bilateral) (R>L); Spondylosis without myelopathy or radiculopathy, cervical region; Chronic musculoskeletal pain; Chronic low back pain (1ry area of Pain) (Bilateral) (L>R) w/o sciatica; Cervical disc disorder at C5-C6 level with radiculopathy; Cervical radiculopathy at C6; Osteoarthrosis of multiple sites; Radicular pain of upper extremity (Right); Chronic upper extremity pain (Right); Chronic shoulder pain (Right); Arthralgia of shoulder region (Right); Shoulder enthesopathy (Right); Musculoskeletal disorder involving upper trapezius muscle (Right); Abnormal MRI, cervical spine (11/12/2019); Chronic neuropathic pain; Disturbance of skin sensation; Hyperalgesia; Trigger finger, middle finger (Right); Trigger point of shoulder region (Right); Carpal tunnel syndrome (Bilateral); Chronic shoulder pain (Left); Osteoarthritis of glenohumeral joint (Right); Primary osteoarthritis of shoulder (Right); Rotator cuff arthropathy of shoulder (Right); Tendinopathy of rotator cuff (Right); Lumbosacral sensory radiculopathy at L5 (Left); Abnormal MRI, lumbar spine (05/17/2022); and Lumbar facet joint pain on their pertinent problem list. Pain Assessment: Severity of Chronic pain is reported as a 4 /10. Location: Back Lower, Left/down side of left leg to above knee. Onset: More than a month ago. Quality: Aching, Constant, Dull, Throbbing. Timing: Constant. Modifying factor(s): rest, heat. Vitals:  height is 5\' 5"  (1.651 m) and weight is 200 lb 12.8 oz (91.1 kg). Her temperature is 96.4 F (35.8 C) (abnormal). Her blood pressure is 140/76 (abnormal) and her pulse is 82. Her respiration is 16 and oxygen saturation is 100%.  BMI: Estimated body mass index is 33.41 kg/m as calculated from the following:   Height as of this encounter: 5\' 5"  (1.651 m).   Weight as of this encounter: 200 lb 12.8 oz (91.1 kg). Last encounter: 01/08/2023. Last procedure:  01/23/2023.  Reason for encounter: both, medication management and post-procedure evaluation and assessment.  The patient indicates doing well with the current medication regimen. No adverse reactions or side effects reported to the medications.   RTCB: 08/23/2023   Post-procedure evaluation  Type: Lumbar Facet, Medial Branch Block(s) (w/ fluoroscopic mapping) #4  Laterality: Bilateral  Level: L2, L3, L4, L5, and S1 Medial Branch Level(s). Injecting these levels blocks the L3-4, L4-5, and L5-S1 lumbar facet joints. Imaging: Fluoroscopic guidance Spinal ([id number]) Anesthesia: Local anesthesia (1-2% Lidocaine) Anxiolysis: IV Versed 2.0 mg Sedation: Moderate Sedation Fentanyl 1 mL (50 mcg) DOS: 01/23/2023 Performed by: Oswaldo Done, MD  Primary Purpose: Diagnostic/Therapeutic Indications: Low back pain severe enough to impact quality of life or function. 1. Chronic low back pain (1ry area of Pain) (Bilateral) (L>R) w/o sciatica   2. Lumbar facet joint pain   3. Lumbar facet syndrome (Bilateral) (L>R)   4. Lumbar facet arthropathy   5. Spondylosis without myelopathy or radiculopathy, lumbar region   6. Degeneration of intervertebral disc of lumbar region with discogenic back pain   7. Encounter for therapeutic procedure    NAS-11 Pain score:   Pre-procedure: 7 /10   Post-procedure: 0-No pain/10    Effectiveness:  Initial hour after procedure: 100 %. Subsequent 4-6 hours post-procedure: 100 %. Analgesia past initial 6 hours: 60 % (current). Ongoing improvement:  Analgesic: The patient indicates having attained 100% relief of the pain for the duration of local anesthetic followed by a decrease to an ongoing 60% improvement.  She is extremely happy about these results and for the time being she indicates that she does not need any other interventions. Function: Ms. Siefken reports improvement in function ROM: Ms. Bruening reports improvement in ROM  Pharmacotherapy  Assessment  Analgesic: Tramadol 50 mg, 1-2 tabs (50-100 mg) PO q 6 hrs (400 mg/day of tramadol) MME/day: 40 mg/day.   Monitoring: [place] PMP: PDMP reviewed during this encounter.       Pharmacotherapy: No side-effects or adverse reactions reported. Compliance: No problems identified. Effectiveness: Clinically acceptable.  Newman Pies, RN  02/08/2023  1:31 PM  Sign when Signing Visit Nursing Pain Medication Assessment:  Safety precautions to be maintained throughout the outpatient stay will include: orient to surroundings, keep bed in low position, maintain call bell within reach at all times, provide assistance with transfer out of bed and ambulation.  Medication Inspection Compliance: Pill count conducted under aseptic conditions, in front of the patient. Neither the pills nor the bottle was removed from the patient's sight at any time. Once count was completed pills were immediately returned to the patient in their original bottle.  Medication: Tramadol (Ultram) Pill/Patch Count:  82 of 120 pills remain Pill/Patch Appearance: Markings consistent with prescribed medication Bottle Appearance: Standard pharmacy container. Clearly labeled. Filled Date: 1 / 62 / 2025 Last Medication intake:  Today  Newman Pies, RN  02/08/2023  1:18 PM  Sign when Signing Visit Safety precautions to be maintained throughout the outpatient stay will include: orient to surroundings, keep bed in low position, maintain call bell within reach at all times, provide assistance with transfer out of bed and ambulation.     No results found for: "CBDTHCR" No results found for: "D8THCCBX" No results found for: "D9THCCBX"  UDS:  Summary  Date Value Ref Range Status  07/19/2022 Note  Final    Comment:    ==================================================================== [employer] Select 13 (MW) ==================================================================== Test                             Result        Flag       Units  Drug Present and Declared for Prescription Verification  Tramadol                       18337        EXPECTED   ng/mg creat   O-Desmethyltramadol            12000        EXPECTED   ng/mg creat   N-Desmethyltramadol            5689         EXPECTED   ng/mg creat    Source of tramadol is a prescription medication. O-desmethyltramadol    and N-desmethyltramadol are expected metabolites of tramadol.  ==================================================================== Test                      Result    Flag   Units      Ref Range   Creatinine              27               mg/dL      >=86 ==================================================================== Declared Medications:  The flagging and interpretation on this report are based on the  following declared medications.  Unexpected results may arise from  inaccuracies in the declared medications.   **Note: The testing scope of this panel includes these medications:   Tramadol (Ultram)   **Note: The testing scope of this panel does not include the  following reported medications:   Albuterol (Ventolin HFA)  Albuterol (Duoneb)  Atenolol (Tenormin)  Buspirone (Buspar)  Dimenhydrinate  Fluticasone (Wixela Inhub)  Gabapentin (Neurontin)  Hydroxyzine (Atarax)  Ipratropium (Duoneb)  Latanoprost (Xalatan)  Lisinopril (Zestril)  Lubiprostone (Amitiza)  Magnesium (Mag-Ox)  Meloxicam (Mobic)  Montelukast (Singulair)  Naloxone (Narcan)  Omeprazole (Prilosec)  Ondansetron (Zofran)  Paliperidone (Invega)  Polyethylene Glycol (MiraLAX)  Prazosin (Minipress)  Probiotic  Salmeterol (Wixela Inhub)  Sertraline (Zoloft)  Sucralfate (Carafate)  Supplement (Fiber)  Trazodone (Desyrel)  Vitamin B ==================================================================== For clinical consultation, please call [phone]. ====================================================================       ROS   Constitutional: Denies any fever or chills Gastrointestinal: No reported hemesis, hematochezia, vomiting, or acute GI distress Musculoskeletal: Denies any acute onset joint swelling, redness, loss of ROM, or weakness Neurological: No reported episodes of acute onset apraxia, aphasia, dysarthria, agnosia, amnesia, paralysis, loss of coordination, or loss of consciousness  Medication Review  Benefiber, Culturelle, Magnesium Oxide -Mg Supplement, albuterol, atenolol, b complex vitamins, busPIRone, dimenhyDRINATE, fluticasone-salmeterol, gabapentin, hydrOXYzine, ipratropium-albuterol, latanoprost, lisinopril, lubiprostone, meloxicam, mirabegron ER, montelukast, naloxone, omeprazole, ondansetron, paliperidone, polyethylene glycol, prazosin, sertraline, sucralfate, traMADol, and traZODone  History Review  Allergy: Ms. Raap is allergic to latex, penicillins, codeine, cortisone, and sulfa antibiotics. Drug: Ms. Crotty  reports no history of drug use. Alcohol:  reports no history of alcohol use. Tobacco:  reports that she quit smoking about 33 years ago. Her smoking use included cigarettes. She started smoking about 53 years ago. She has a 30 pack-year smoking history. She has been exposed to tobacco smoke. She has never used smokeless tobacco. Social: Ms. Steinkamp  reports that she quit smoking about 33 years ago. Her smoking use included cigarettes. She started smoking about 53 years ago. She has a 30 pack-year smoking history. She has been exposed to tobacco smoke. She has never used smokeless tobacco. She reports that she does not drink alcohol and does not use drugs. 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 Surgical: Ms. Mickiewicz  has a past surgical history that includes Appendectomy; Trigger finger release; Esophagogastroduodenoscopy (01/17/2011); Colonoscopy (01/17/2011); Abdominal hysterectomy; and Breast biopsy (Left, 2015). Family: family history includes Allergies in her mother; Asthma in her mother and sister; Breast cancer in her cousin; Colon cancer in her paternal aunt; Diabetes in her maternal grandfather and maternal grandmother; Heart disease in her brother, father, paternal grandfather, and paternal grandmother; Liver cancer in her paternal uncle; Ovarian cancer in an other family member; Pancreatic cancer in an other family member.  Laboratory Chemistry Profile   Renal Lab Results  Component Value Date   BUN 6 (L) 10/30/2022   CREATININE 0.80 10/30/2022   BCR 8 (L) 10/30/2022   GFRAA >60 08/05/2019   GFRNONAA >60 08/12/2020    Hepatic Lab Results  Component Value Date   AST 19 10/30/2022   ALT 14 10/30/2022   ALBUMIN 3.9 10/30/2022   ALKPHOS 107 10/30/2022   HCVAB REACTIVE (A) 01/12/2014   LIPASE 34 10/31/2010    Electrolytes Lab Results  Component Value Date   NA 138 10/30/2022   K 4.7 10/30/2022   CL 100 10/30/2022   CALCIUM 9.1 10/30/2022   MG 2.2 08/29/2017    Bone Lab Results  Component Value Date   VD25OH 16.7 (L) 03/06/2022   25OHVITD1 21 (L) 08/29/2017   25OHVITD2 <1.0 08/29/2017   25OHVITD3 21 08/29/2017    Inflammation (CRP: Acute Phase) (ESR: Chronic Phase) Lab Results  Component Value Date   CRP 12 (H) 08/29/2017   ESRSEDRATE 46 (H) 08/29/2017         Note: Above Lab results reviewed.  Recent Imaging Review  DG PAIN CLINIC C-ARM 1-60 MIN NO REPORT Fluoro was used, but no Radiologist interpretation will be provided.  Please refer to "NOTES" tab for provider progress note. Note: Reviewed        Physical Exam  General appearance: Well nourished, well developed, and well hydrated. In no apparent acute distress Mental status:  Alert, oriented x 3 (person, place, & time)       Respiratory: No evidence of acute respiratory distress Eyes: PERLA Vitals: BP (!) 140/76   Pulse 82   Temp (!) 96.4 F (35.8 C)   Resp 16   Ht 5\' 5"  (1.651 m)   Wt 200 lb 12.8 oz (91.1 kg)   SpO2 100%   BMI 33.41 kg/m  BMI: Estimated body mass index is 33.41 kg/m as calculated from the following:   Height as of this encounter: 5\' 5"  (1.651 m).   Weight as of this encounter: 200 lb 12.8 oz (91.1 kg). Ideal: Ideal body weight: 57 kg (125 lb 10.6 oz) Adjusted ideal body weight: 70.6 kg (155 lb 11.5 oz)  Assessment   Diagnosis Status  1. Chronic low back pain (1ry area of Pain) (Bilateral) (L>R) w/o sciatica   2. Lumbar facet joint pain   3. Lumbar facet syndrome (Bilateral) (L>R)   4. Postop check   5. Chronic low back pain (1ry area of Pain) (Bilateral) w/ sciatica (Left)   6. Chronic lower extremity pain (2ry area of Pain) (Left)   7. Chronic upper back pain (3ry area of Pain) (Bilateral) (L>R)   8. Chronic neck pain (4th area of Pain) (Bilateral) (L>R)   9. Chronic pain syndrome   10. Pharmacologic therapy   11. Chronic use of  opiate for therapeutic purpose   12. Encounter for medication management   13. Encounter for chronic pain management    Controlled Controlled Controlled   Updated Problems: No problems updated.  Plan of Care  Problem-specific:  Assessment and Plan            Ms. REVE CROCKET has a current medication list which includes the following long-term medication(s): albuterol, atenolol, ipratropium-albuterol, lisinopril, lubiprostone, magnesium oxide -mg supplement, meloxicam, mirabegron er, montelukast, naloxone, omeprazole, paliperidone, prazosin, sertraline, sucralfate, [START ON 02/24/2023] tramadol, trazodone, wixela inhub, gabapentin, and benefiber.  Pharmacotherapy (Medications Ordered): Meds ordered this encounter  Medications   traMADol (ULTRAM) 50 MG tablet    Sig: Take 1 tablet  (50 mg total) by mouth every 6 (six) hours as needed for severe pain (pain score 7-10). Each refill must last 30 days.    Dispense:  120 tablet    Refill:  5    DO NOT: delete (not duplicate); no partial-fill (will deny script to complete), no refill request (F/U required). DISPENSE: 1 day early if closed on fill date. WARN: No CNS-depressants within 8 hrs of med.   Orders:  Orders Placed This Encounter  Procedures   Nursing Instructions:    Please complete this patient's postprocedure evaluation.    Scheduling Instructions:     Please complete this patient's postprocedure evaluation.   Follow-up plan:   Return in about 28 weeks (around 08/23/2023) for Eval-day (M,W), (F2F), (MM).      Interventional Therapies  Risk Factors  Complex Considerations:   Allergy: LATEX  Hx Hepatitis C (w/ cirrhosis)   Planned  Pending:      Under consideration:   Diagnostic/therapeutic left cervical ESI #2  Therapeutic left caudal ESI #2  Therapeutic right cervical ESI #3  Therapeutic right Racz procedure #1  Diagnostic right suprascapular NB #1  Therapeutic bilateral lumbar facet RFA #1 (starting on left side)  Therapeutic bilateral cervical facet RFA #1 (starting on left side)    Completed:   Diagnostic/therapeutic left L4-5 LESI x1 (06/14/2024) (100/100/90/90)  Therapeutic right glenohumeral/AC  inj. x4 (12/08/2021) (100/100/100/100)  Therapeutic bilateral lumbar facet MBB x4 (01/23/2023) (100/100/60/60)  Diagnostic bilateral cervical facet MBB x1 (11/06/2017) (100/100/90)  Diagnostic left caudal ESI x1 (08/14/2019) (100/100/100 x8 days/90-100)  Therapeutic right cervical ESI x3 (04/12/2021) (100/100/80-95/80-95)  Therapeutic left cervical ESI x1 (07/19/2021) (100/100/100/100) (helped neck, shoulders, headache, and arms)    Therapeutic  Palliative (PRN) options:   Therapeutic/palliative glenohumeral/AC  inj.   Therapeutic/palliative lumbar facet MBB   Therapeutic/palliative cervical facet  MBB   Therapeutic/palliative caudal ESI   Therapeutic/palliative cervical ESI    Pharmacotherapy:  Nonopioids transfer 10/27/2019: [facility], Amitiza, magnesium, Mobic, and Neurontin       Recent Visits Date Type Provider Dept  01/23/23 Procedure visit Delano Metz, MD [facility]  01/08/23 Office Visit Delano Metz, MD [facility]  Showing recent visits within past 90 days and meeting all other requirements Today's Visits Date Type Provider Dept  02/08/23 Office Visit Delano Metz, MD [facility]  Showing today's visits and meeting all other requirements Future Appointments No visits were found meeting these conditions. Showing future appointments within next 90 days and meeting all other requirements  I discussed the assessment and treatment plan with the patient. The patient was provided an opportunity to ask questions and all were answered. The patient agreed with the plan and demonstrated an understanding of the instructions.  Patient advised to call back or seek an in-person  evaluation if the symptoms or condition worsens.  Duration of encounter: 30 minutes.  Total time on encounter, as per AMA guidelines included both the face-to-face and non-face-to-face time personally spent by the physician and/or other qualified health care professional(s) on the day of the encounter (includes time in activities that require the physician or other qualified health care professional and does not include time in activities normally performed by clinical staff). Physician's time may include the following activities when performed: Preparing to see the patient (e.g., pre-charting review of records, searching for previously ordered imaging, lab work, and nerve conduction tests) Review of prior analgesic pharmacotherapies. Reviewing PMP Interpreting ordered tests (e.g., lab work, imaging, nerve conduction tests) Performing post-procedure  evaluations, including interpretation of diagnostic procedures Obtaining and/or reviewing separately obtained history Performing a medically appropriate examination and/or evaluation Counseling and educating the patient/family/caregiver Ordering medications, tests, or procedures Referring and communicating with other health care professionals (when not separately reported) Documenting clinical information in the electronic or other health record Independently interpreting results (not separately reported) and communicating results to the patient/ family/caregiver Care coordination (not separately reported)  Note by: Oswaldo Done, MD Date: 02/08/2023; Time: 3:40 PM

## 2023-02-08 NOTE — Patient Instructions (Signed)

## 2023-02-08 NOTE — Progress Notes (Signed)
Nursing Pain Medication Assessment:  Safety precautions to be maintained throughout the outpatient stay will include: orient to surroundings, keep bed in low position, maintain call bell within reach at all times, provide assistance with transfer out of bed and ambulation.  Medication Inspection Compliance: Pill count conducted under aseptic conditions, in front of the patient. Neither the pills nor the bottle was removed from the patient's sight at any time. Once count was completed pills were immediately returned to the patient in their original bottle.  Medication: Tramadol (Ultram) Pill/Patch Count:  82 of 120 pills remain Pill/Patch Appearance: Markings consistent with prescribed medication Bottle Appearance: Standard pharmacy container. Clearly labeled. Filled Date: 1 / 36 / 2025 Last Medication intake:  Today

## 2023-02-10 ENCOUNTER — Other Ambulatory Visit: Payer: Self-pay | Admitting: Family Medicine

## 2023-02-10 DIAGNOSIS — F419 Anxiety disorder, unspecified: Secondary | ICD-10-CM

## 2023-02-12 ENCOUNTER — Other Ambulatory Visit: Payer: Self-pay | Admitting: Family Medicine

## 2023-02-12 DIAGNOSIS — F419 Anxiety disorder, unspecified: Secondary | ICD-10-CM

## 2023-02-14 ENCOUNTER — Other Ambulatory Visit: Payer: Self-pay | Admitting: Family Medicine

## 2023-02-14 DIAGNOSIS — J4520 Mild intermittent asthma, uncomplicated: Secondary | ICD-10-CM

## 2023-02-15 ENCOUNTER — Telehealth: Payer: Self-pay

## 2023-02-15 ENCOUNTER — Ambulatory Visit (HOSPITAL_COMMUNITY)
Admission: RE | Admit: 2023-02-15 | Discharge: 2023-02-15 | Disposition: A | Discharge status: Home / Self Care | Payer: 59 | Source: Ambulatory Visit | Attending: Gastroenterology | Admitting: Gastroenterology

## 2023-02-15 ENCOUNTER — Encounter: Payer: Self-pay | Admitting: Family Medicine

## 2023-02-15 DIAGNOSIS — R109 Unspecified abdominal pain: Secondary | ICD-10-CM | POA: Diagnosis not present

## 2023-02-15 DIAGNOSIS — F419 Anxiety disorder, unspecified: Secondary | ICD-10-CM

## 2023-02-15 DIAGNOSIS — R110 Nausea: Secondary | ICD-10-CM | POA: Diagnosis not present

## 2023-02-15 DIAGNOSIS — G8929 Other chronic pain: Secondary | ICD-10-CM | POA: Diagnosis not present

## 2023-02-15 MED ORDER — TECHNETIUM TC 99M MEBROFENIN IV KIT
5.1000 | PACK | Freq: Once | INTRAVENOUS | Status: AC | PRN
  Administered 2023-02-15: 5.1 via INTRAVENOUS

## 2023-02-15 MED ORDER — PALIPERIDONE ER 6 MG PO TB24: 6.0000 mg | ORAL_TABLET | Freq: Every morning | ORAL | 2 refills | Status: DC

## 2023-02-15 NOTE — Addendum Note (Signed)
 Addended by: Maryclare Smoke on: 02/15/2023 01:13 PM   Modules accepted: Orders

## 2023-02-15 NOTE — Telephone Encounter (Signed)
 Copied from CRM [id number]. Topic: Clinical - Prescription Issue >> Feb 15, 2023 12:11 PM Susanna ORN wrote: Reason for CRM: Etha, with [facility] PA dept, called stating that they received a prescription for paliperidone  (INVEGA ) 6 MG 24 hr tablet, requesting for a 90 day supply but the system only accepts 30 for 30. She stated if it exceeds the limit, it will then be sent to the clinician team for a quantity limit review. Nichole stated if pcp chooses to do 30 for 30, then they will not have to do a PA & patient can get it directly from pharmacy.

## 2023-02-15 NOTE — Telephone Encounter (Signed)
 Prescription for paliperidone  has been resent as #30 tablets for 30 days with 2 refills.  Meds ordered this encounter  Medications   paliperidone  (INVEGA ) 6 MG 24 hr tablet    Sig: Take 1 tablet (6 mg total) by mouth every morning.    Dispense:  30 tablet    Refill:  2    Supervising Provider:   OLSON, DANIEL K [[id number]]

## 2023-02-21 ENCOUNTER — Encounter: Payer: 59 | Admitting: Pain Medicine

## 2023-02-22 ENCOUNTER — Other Ambulatory Visit: Payer: Self-pay

## 2023-02-22 DIAGNOSIS — E785 Hyperlipidemia, unspecified: Secondary | ICD-10-CM

## 2023-02-22 DIAGNOSIS — I10 Essential (primary) hypertension: Secondary | ICD-10-CM

## 2023-03-01 ENCOUNTER — Other Ambulatory Visit: Payer: Self-pay | Admitting: Family Medicine

## 2023-03-01 DIAGNOSIS — F419 Anxiety disorder, unspecified: Secondary | ICD-10-CM

## 2023-03-05 ENCOUNTER — Other Ambulatory Visit: Payer: 59

## 2023-03-05 DIAGNOSIS — I10 Essential (primary) hypertension: Secondary | ICD-10-CM

## 2023-03-05 DIAGNOSIS — E785 Hyperlipidemia, unspecified: Secondary | ICD-10-CM | POA: Diagnosis not present

## 2023-03-06 ENCOUNTER — Encounter: Payer: Self-pay | Admitting: Family Medicine

## 2023-03-06 LAB — CBC WITH DIFFERENTIAL/PLATELET
Basophils Absolute: 0.1 10*3/uL (ref 0.0–0.2)
Basos: 1 %
EOS (ABSOLUTE): 0.1 10*3/uL (ref 0.0–0.4)
Eos: 2 %
Hematocrit: 39.3 % (ref 34.0–46.6)
Hemoglobin: 12.6 g/dL (ref 11.1–15.9)
Immature Grans (Abs): 0 10*3/uL (ref 0.0–0.1)
Immature Granulocytes: 0 %
Lymphocytes Absolute: 2 10*3/uL (ref 0.7–3.1)
Lymphs: 34 %
MCH: 25.3 pg — ABNORMAL LOW (ref 26.6–33.0)
MCHC: 32.1 g/dL (ref 31.5–35.7)
MCV: 79 fL (ref 79–97)
Monocytes Absolute: 0.5 10*3/uL (ref 0.1–0.9)
Monocytes: 9 %
Neutrophils Absolute: 3.2 10*3/uL (ref 1.4–7.0)
Neutrophils: 54 %
Platelets: 272 10*3/uL (ref 150–450)
RBC: 4.98 x10E6/uL (ref 3.77–5.28)
RDW: 13.5 % (ref 11.7–15.4)
WBC: 5.9 10*3/uL (ref 3.4–10.8)

## 2023-03-06 LAB — COMPREHENSIVE METABOLIC PANEL
ALT: 12 [IU]/L (ref 0–32)
AST: 18 [IU]/L (ref 0–40)
Albumin: 3.9 g/dL (ref 3.9–4.9)
Alkaline Phosphatase: 118 [IU]/L (ref 44–121)
BUN/Creatinine Ratio: 10 — ABNORMAL LOW (ref 12–28)
BUN: 8 mg/dL (ref 8–27)
Bilirubin Total: 0.3 mg/dL (ref 0.0–1.2)
CO2: 23 mmol/L (ref 20–29)
Calcium: 9.2 mg/dL (ref 8.7–10.3)
Chloride: 100 mmol/L (ref 96–106)
Creatinine, Ser: 0.81 mg/dL (ref 0.57–1.00)
Globulin, Total: 3 g/dL (ref 1.5–4.5)
Glucose: 88 mg/dL (ref 70–99)
Potassium: 4.6 mmol/L (ref 3.5–5.2)
Sodium: 140 mmol/L (ref 134–144)
Total Protein: 6.9 g/dL (ref 6.0–8.5)
eGFR: 79 mL/min/{1.73_m2} (ref >59)

## 2023-03-06 LAB — LIPID PANEL
Chol/HDL Ratio: 3.9 {ratio} (ref 0.0–4.4)
Cholesterol, Total: 194 mg/dL (ref 100–199)
HDL: 50 mg/dL (ref >39)
LDL Chol Calc (NIH): 126 mg/dL — ABNORMAL HIGH (ref 0–99)
Triglycerides: 102 mg/dL (ref 0–149)
VLDL Cholesterol Cal: 18 mg/dL (ref 5–40)

## 2023-03-12 ENCOUNTER — Ambulatory Visit (INDEPENDENT_AMBULATORY_CARE_PROVIDER_SITE_OTHER): Payer: 59 | Admitting: Family Medicine

## 2023-03-12 ENCOUNTER — Encounter: Payer: Self-pay | Admitting: Family Medicine

## 2023-03-12 VITALS — BP 116/68 | HR 83 | Ht 65.0 in | Wt 199.2 lb

## 2023-03-12 DIAGNOSIS — I10 Essential (primary) hypertension: Secondary | ICD-10-CM | POA: Diagnosis not present

## 2023-03-12 DIAGNOSIS — M797 Fibromyalgia: Secondary | ICD-10-CM | POA: Diagnosis not present

## 2023-03-12 DIAGNOSIS — F419 Anxiety disorder, unspecified: Secondary | ICD-10-CM | POA: Diagnosis not present

## 2023-03-12 DIAGNOSIS — I8500 Esophageal varices without bleeding: Secondary | ICD-10-CM

## 2023-03-12 DIAGNOSIS — E785 Hyperlipidemia, unspecified: Secondary | ICD-10-CM

## 2023-03-12 DIAGNOSIS — N3941 Urge incontinence: Secondary | ICD-10-CM

## 2023-03-12 DIAGNOSIS — M1991 Primary osteoarthritis, unspecified site: Secondary | ICD-10-CM | POA: Diagnosis not present

## 2023-03-12 MED ORDER — BUSPIRONE HCL 10 MG PO TABS: 10.0000 mg | ORAL_TABLET | Freq: Three times a day (TID) | ORAL | 1 refills | Status: DC

## 2023-03-12 MED ORDER — GABAPENTIN 100 MG PO CAPS: 200.0000 mg | ORAL_CAPSULE | Freq: Every day | ORAL | 1 refills | Status: DC

## 2023-03-12 MED ORDER — ATENOLOL 25 MG PO TABS: 25.0000 mg | ORAL_TABLET | Freq: Every day | ORAL | 1 refills | Status: DC

## 2023-03-12 MED ORDER — MELOXICAM 15 MG PO TABS: 15.0000 mg | ORAL_TABLET | Freq: Every day | ORAL | 1 refills | Status: DC

## 2023-03-12 MED ORDER — MIRABEGRON ER 25 MG PO TB24: 25.0000 mg | ORAL_TABLET | Freq: Every day | ORAL | 2 refills | Status: AC

## 2023-03-12 MED ORDER — OMEPRAZOLE 40 MG PO CPDR
DELAYED_RELEASE_CAPSULE | ORAL | 1 refills | Status: AC
Start: 2023-03-12 — End: ?

## 2023-03-12 MED ORDER — LISINOPRIL 5 MG PO TABS: 5.0000 mg | ORAL_TABLET | Freq: Every day | ORAL | 1 refills | Status: DC

## 2023-03-12 NOTE — Assessment & Plan Note (Signed)
 BP goal <130/80.  Blood pressure essentially at goal.  Continue lisinopril 5 mg daily, atenolol 25 mg daily.  CMP has improved.  Will continue to monitor.

## 2023-03-12 NOTE — Assessment & Plan Note (Signed)
 Lipid panel: LDL 126, HDL 50, triglycerides 102.  LDL continues to improve. The 10-year ASCVD risk score (Arnett DK, et al., 2019) is: 9.1%.  If LDL does not decrease and ASCVD risk score remains above 7.5% at the next check, recommend starting statin medication.

## 2023-03-12 NOTE — Progress Notes (Signed)
 Established Patient Office Visit  Subjective   Patient ID: Rachael West, female    DOB: Nov 05, 1955  Age: 68 y.o. MRN: [id number]  Chief Complaint  Patient presents with   Hypertension   Hyperlipidemia   Asthma    HPI Rachael West is a 68 y.o. female presenting today for follow up of hypertension, hyperlipidemia, mood. Hypertension: Patient here for follow-up of elevated blood pressure. Pt denies chest pain, SOB, dizziness, edema, syncope, fatigue or heart palpitations. Taking lisinopril and atenolol, reports excellent compliance with treatment. Denies side effects. Hyperlipidemia: She has been making efforts to decrease her consumption of trans and saturated fats and be more active. The 10-year ASCVD risk score (Arnett DK, et al., 2019) is: 9.1% Mood: Patient is here to follow up for depression and anxiety, currently managing with buspirone and paliperidone.  She has been using paliperidone for mood symptoms for many years, but due to insurance she was off of it for a little while.  She states that mood has been gradually improving since restarting the paliperidone.  Taking medication without side effects, reports excellent compliance with treatment. Denies mood changes or SI/HI.      03/12/2023    8:40 AM 01/08/2023    8:21 AM 11/06/2022    8:50 AM  Depression screen PHQ 2/9  Decreased Interest 2 0 2  Down, Depressed, Hopeless 2 0 2  PHQ - 2 Score 4 0 4  Altered sleeping 3  3  Tired, decreased energy 1  2  Change in appetite 1  2  Feeling bad or failure about yourself  1  2  Trouble concentrating 0  3  Moving slowly or fidgety/restless 1  3  Suicidal thoughts 0  1  PHQ-9 Score 11  20  Difficult doing work/chores Somewhat difficult  Somewhat difficult       03/12/2023    8:41 AM 11/06/2022    8:50 AM 07/05/2022    9:34 AM 04/25/2022   10:11 AM  GAD 7 : Generalized Anxiety Score  Nervous, Anxious, on Edge 3 3 3 3   Control/stop worrying 2 3 2 3   Worry too  much - different things  3 3 2   Trouble relaxing 1 2 3 3   Restless 1 2 2 1   Easily annoyed or irritable 0 1 1 1   Afraid - awful might happen 1 3 2 3   Total GAD 7 Score  17 16 16   Anxiety Difficulty Somewhat difficult Somewhat difficult Somewhat difficult Very difficult     Outpatient Medications Prior to Visit  Medication Sig   albuterol (VENTOLIN HFA) 108 (90 Base) MCG/ACT inhaler INHALE 2 PUFFS BY MOUTH INTO THE LUNGS 4 TIMES DAILY   b complex vitamins tablet Take 1 tablet by mouth daily.   dimenhyDRINATE (DRAMAMINE) 50 MG tablet Take 1 tablet (50 mg total) by mouth every 8 (eight) hours as needed.   hydrOXYzine (ATARAX) 50 MG tablet TAKE 1 TABLET BY MOUTH EVERYDAY AT BEDTIME   ipratropium-albuterol (DUONEB) 0.5-2.5 (3) MG/3ML SOLN INHALE 3 MLS VIA NEBULIZER AS DIRECTED   Lactobacillus Rhamnosus, GG, (CULTURELLE) CAPS Take 1 capsule by mouth daily.   latanoprost (XALATAN) 0.005 % ophthalmic solution Place 1 drop into both eyes at bedtime.   linaclotide (LINZESS) 290 MCG CAPS capsule Take 290 mcg by mouth daily before breakfast.   Magnesium Oxide -Mg Supplement 500 MG CAPS Take 1 capsule (500 mg total) by mouth in the morning and at bedtime.   montelukast (SINGULAIR) 10 MG tablet  TAKE 1 TABLET BY MOUTH EVERY DAY   naloxone (NARCAN) nasal spray 4 mg/0.1 mL Place 1 spray into the nose as needed for up to 365 doses (for opioid-induced respiratory depresssion). In case of emergency (overdose), spray once into each nostril. If no response within 3 minutes, repeat application and call 911.   ondansetron (ZOFRAN) 4 MG tablet Take 1 tablet (4 mg total) by mouth 2 (two) times daily.   paliperidone (INVEGA) 6 MG 24 hr tablet TAKE 1 TABLET (6 MG TOTAL) BY MOUTH EVERY MORNING.   polyethylene glycol (MIRALAX / GLYCOLAX) packet Take 17 g by mouth 2 (two) times daily.   prazosin (MINIPRESS) 1 MG capsule Take 2 mg by mouth at bedtime.   sertraline (ZOLOFT) 100 MG tablet Take 200 mg by mouth every morning.    sucralfate (CARAFATE) 1 g tablet Take 1 tablet (1 g total) by mouth 2 (two) times daily.   traMADol (ULTRAM) 50 MG tablet Take 1 tablet (50 mg total) by mouth every 6 (six) hours as needed for severe pain (pain score 7-10). Each refill must last 30 days.   traZODone (DESYREL) 50 MG tablet 1 TABLET BY MOUTH AT BEDTIME   WIXELA INHUB 250-50 MCG/ACT AEPB Inhale 1 puff into the lungs in the morning and at bedtime.   [DISCONTINUED] atenolol (TENORMIN) 25 MG tablet TAKE 1 TABLET (25 MG TOTAL) BY MOUTH DAILY.   [DISCONTINUED] busPIRone (BUSPAR) 10 MG tablet Take 1 tablet (10 mg total) by mouth 3 (three) times daily.   [DISCONTINUED] lisinopril (ZESTRIL) 5 MG tablet TAKE 1 TABLET (5 MG TOTAL) BY MOUTH DAILY.   [DISCONTINUED] meloxicam (MOBIC) 15 MG tablet TAKE 1 TABLET (15 MG TOTAL) BY MOUTH DAILY.   [DISCONTINUED] mirabegron ER (MYRBETRIQ) 25 MG TB24 tablet Take 1 tablet (25 mg total) by mouth daily.   [DISCONTINUED] omeprazole (PRILOSEC) 40 MG capsule TAKE 1 CAPSULE BY MOUTH EVERY DAY   Wheat Dextrin (BENEFIBER) POWD Take 6 g by mouth 3 (three) times daily before meals. (2 tsp = 6 g)   [DISCONTINUED] gabapentin (NEURONTIN) 100 MG capsule Take 2 capsules (200 mg total) by mouth at bedtime. Follow written titration schedule.   [DISCONTINUED] lubiprostone (AMITIZA) 24 MCG capsule TAKE 1 CAPSULE (24 MCG TOTAL) BY MOUTH 2 (TWO) TIMES DAILY WITH A MEAL. (Patient not taking: Reported on 03/12/2023)   No facility-administered medications prior to visit.    ROS Negative unless otherwise noted in HPI   Objective:     BP 116/68   Pulse 83   Ht 5\' 5"  (1.651 m)   Wt 199 lb 4 oz (90.4 kg)   SpO2 99%   BMI 33.16 kg/m   Physical Exam Constitutional:      General: She is not in acute distress.    Appearance: Normal appearance.  HENT:     Head: Normocephalic and atraumatic.  Cardiovascular:     Rate and Rhythm: Normal rate and regular rhythm.     Heart sounds: No murmur heard.    No friction rub. No  gallop.  Pulmonary:     Effort: Pulmonary effort is normal. No respiratory distress.     Breath sounds: No wheezing, rhonchi or rales.  Skin:    General: Skin is warm and dry.  Neurological:     Mental Status: She is alert and oriented to person, place, and time.     Assessment & Plan:  Essential hypertension Assessment & Plan: BP goal <130/80.  Blood pressure essentially at goal.  Continue lisinopril  5 mg daily, atenolol 25 mg daily.  CMP has improved.  Will continue to monitor.  Orders: -     Atenolol; Take 1 tablet (25 mg total) by mouth daily.  Dispense: 90 tablet; Refill: 1 -     Lisinopril; Take 1 tablet (5 mg total) by mouth daily.  Dispense: 90 tablet; Refill: 1  Hyperlipidemia, unspecified hyperlipidemia type Assessment & Plan: Lipid panel: LDL 126, HDL 50, triglycerides 102.  LDL continues to improve. The 10-year ASCVD risk score (Arnett DK, et al., 2019) is: 9.1%.  If LDL does not decrease and ASCVD risk score remains above 7.5% at the next check, recommend starting statin medication.   Fibromyalgia -     Gabapentin; Take 2 capsules (200 mg total) by mouth at bedtime. Follow written titration schedule.  Dispense: 180 capsule; Refill: 1  Anxiety Assessment & Plan: Continue buspirone 10 mg 3 times daily and paliperidone 6 mg daily.  Continue to monitor closely, may need to consider SSRI therapy if anxiety is not well-controlled at her follow-up appointment.  Orders: -     busPIRone HCl; Take 1 tablet (10 mg total) by mouth 3 (three) times daily.  Dispense: 270 tablet; Refill: 1  Esophageal varices without bleeding, unspecified esophageal varices type (HCC) -     Omeprazole; TAKE 1 CAPSULE BY MOUTH EVERY DAY  Dispense: 90 capsule; Refill: 1  Osteoarthrosis of multiple sites -     Meloxicam; Take 1 tablet (15 mg total) by mouth daily.  Dispense: 90 tablet; Refill: 1  Urge incontinence -     Mirabegron ER; Take 1 tablet (25 mg total) by mouth daily.  Dispense: 30  tablet; Refill: 2   Hemoglobin, kidney function, and cholesterol have all improved.    Return in about 4 months (around 07/12/2023) for follow-up for HTN, HLD, asthma, mood, fasting labs 1 week before.    Melida Quitter, PA

## 2023-03-12 NOTE — Assessment & Plan Note (Signed)
 Continue buspirone 10 mg 3 times daily and paliperidone 6 mg daily.  Continue to monitor closely, may need to consider SSRI therapy if anxiety is not well-controlled at her follow-up appointment.

## 2023-04-09 ENCOUNTER — Other Ambulatory Visit: Payer: Self-pay | Admitting: Family Medicine

## 2023-04-09 MED ORDER — LATANOPROST 0.005 % OP SOLN
1.0000 [drp] | Freq: Every day | OPHTHALMIC | 2 refills | Status: AC
Start: 2023-04-09 — End: ?

## 2023-05-02 ENCOUNTER — Other Ambulatory Visit: Payer: Self-pay | Admitting: Nurse Practitioner

## 2023-05-03 ENCOUNTER — Ambulatory Visit: Payer: Self-pay

## 2023-05-03 NOTE — Telephone Encounter (Signed)
 Pt requesting information regarding NP appt in June. This RN advised of what documents to bring, arrival time, check in, appointment process and answered pt's questions. No additional questions or concerns at this time.  Copied from CRM [id number]. Topic: Appointments - Appointment Scheduling >> May 03, 2023 10:40 AM Isabell A wrote: Patient/patient representative is calling to schedule an appointment. Refer to attachments for appointment information. Reason for Disposition  General information question, no triage required and triager able to answer question  Answer Assessment - Initial Assessment Questions 1. REASON FOR CALL or QUESTION: "What is your reason for calling today?" or "How can I best help you?" or "What question do you have that I can help answer?"     Pt had questions regarding NP appt  Protocols used: Information Only Call - No Triage-A-AH

## 2023-05-06 ENCOUNTER — Other Ambulatory Visit: Payer: Self-pay | Admitting: Family Medicine

## 2023-05-06 DIAGNOSIS — J4520 Mild intermittent asthma, uncomplicated: Secondary | ICD-10-CM

## 2023-05-08 DIAGNOSIS — M5459 Other low back pain: Secondary | ICD-10-CM | POA: Insufficient documentation

## 2023-05-08 DIAGNOSIS — M545 Low back pain, unspecified: Secondary | ICD-10-CM | POA: Insufficient documentation

## 2023-05-08 NOTE — Progress Notes (Unsigned)
 PROVIDER NOTE: Interpretation of information contained herein should be left to medically-trained personnel. Specific patient instructions are provided elsewhere under "Patient Instructions" section of medical record. This document was created in part using AI and STT-dictation technology, any transcriptional errors that may result from this process are unintentional.  Patient: Rachael West  Service: E/M   PCP: Noreene Bearded, PA  DOB: Jul 16, 1955  DOS: 05/09/2023  Provider: Candi Chafe, MD  MRN: [id number]  Delivery: Face-to-face  Specialty: Interventional Pain Management  Type: Established Patient  Setting: Ambulatory outpatient facility  Specialty designation: 09  Referring Prov.: Noreene Bearded, PA  Location: Outpatient office facility       HPI  [name], a 68 y.o. year old female, is here today because of her Chronic bilateral low back pain without sciatica [M54.50, G89.29]. Rachael West primary complain today is No chief complaint on file.  Pertinent problems: Rachael West has Chronic low back pain (1ry area of Pain) (Bilateral) w/ sciatica (Left); Chronic lower extremity pain (2ry area of Pain) (Left); Chronic upper back pain (3ry area of Pain) (Bilateral) (L>R); Chronic neck pain (4th area of Pain) (Bilateral) (L>R); Occipital headache; Chronic knee pain (Bilateral) (R>L); Chronic pain syndrome; Chronic sacroiliac joint pain; DDD (degenerative disc disease), lumbar; Lumbar facet arthropathy; Lumbar facet syndrome (Bilateral) (L>R); Spondylosis without myelopathy or radiculopathy, lumbar region; DDD (degenerative disc disease), thoracic; Thoracolumbar dextroscoliosis; Cervicogenic headache; Tendinitis of rotator cuff (Left); Tendinitis of rotator cuff (Right); Fibromyalgia; Chronic shoulder pain (Bilateral) (R>L); Chronic hip pain (Bilateral) (L>R); Cervicalgia; Chronic idiopathic gout; DDD (degenerative disc disease), cervical; 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); Cervical facet syndrome (Bilateral) (R>L); Spondylosis without myelopathy or radiculopathy, cervical region; Chronic musculoskeletal pain; Chronic low back pain (1ry area of Pain) (Bilateral) (L>R) w/o sciatica; Cervical disc disorder at C5-C6 level with radiculopathy; Cervical radiculopathy at C6; Osteoarthrosis of multiple sites; Radicular pain of upper extremity (Right); Chronic upper extremity pain (Right); Chronic shoulder pain (Right); Arthralgia of shoulder region (Right); Shoulder enthesopathy (Right); Musculoskeletal disorder involving upper trapezius muscle (Right); Abnormal MRI, cervical spine (11/12/2019); Chronic neuropathic pain; Disturbance of skin sensation; Hyperalgesia; Trigger finger, middle finger (Right); Trigger point of shoulder region (Right); Carpal tunnel syndrome (Bilateral); Chronic shoulder pain (Left); Osteoarthritis of glenohumeral joint (Right); Osteoarthritis of shoulder (Right); Rotator cuff arthropathy of shoulder (Right); Tendinopathy of rotator cuff (Right); Lumbosacral sensory radiculopathy at L5 (Left); Abnormal MRI, lumbar spine (05/17/2022); Lumbar facet joint pain; Low back pain of over 3 months duration; Intermittent low back pain; Mechanical low back pain; and Multifactorial low back pain on their pertinent problem list. Pain Assessment: Severity of   is reported as a  /10. Location:    / . Onset:  . Quality:  . Timing:  . Modifying factor(s):  Rachael West Vitals:  vitals were not taken for this visit.  BMI: Estimated body mass index is 33.16 kg/m as calculated from the following:   Height as of 03/12/23: 5\' 5"  (1.651 m).   Weight as of 03/12/23: 199 lb 4 oz (90.4 kg). Last encounter: 02/08/2023. Last procedure: 01/23/2023.  Reason for encounter: evaluation of worsening, or previously known (established) problem. ***  Discussed the use of AI scribe software for clinical note transcription with the patient, who gave verbal consent to  proceed.  History of Present Illness           Pharmacotherapy Assessment  Analgesic: Tramadol  50 mg, 1-2 tabs (50-100 mg) PO q 6 hrs (400 mg/day of tramadol ) MME/day:  40 mg/day.   Monitoring: [place] PMP: PDMP not reviewed this encounter.       Pharmacotherapy: No side-effects or adverse reactions reported. Compliance: No problems identified. Effectiveness: Clinically acceptable.  No notes on file  No results found for: "CBDTHCR" No results found for: "D8THCCBX" No results found for: "D9THCCBX"  UDS:  Summary  Date Value Ref Range Status  07/19/2022 Note  Final    Comment:    ==================================================================== [employer] Select 13 (MW) ==================================================================== Test                             Result       Flag       Units  Drug Present and Declared for Prescription Verification   Tramadol                        18337        EXPECTED   ng/mg creat   O-Desmethyltramadol            12000        EXPECTED   ng/mg creat   N-Desmethyltramadol            5689         EXPECTED   ng/mg creat    Source of tramadol  is a prescription medication. O-desmethyltramadol    and N-desmethyltramadol are expected metabolites of tramadol .  ==================================================================== Test                      Result    Flag   Units      Ref Range   Creatinine              27               mg/dL      >=32 ==================================================================== Declared Medications:  The flagging and interpretation on this report are based on the  following declared medications.  Unexpected results may arise from  inaccuracies in the declared medications.   **Note: The testing scope of this panel includes these medications:   Tramadol  (Ultram )   **Note: The testing scope of this panel does not include the  following reported medications:   Albuterol  (Ventolin  HFA)  Albuterol   (Duoneb)  Atenolol  (Tenormin )  Buspirone  (Buspar )  Dimenhydrinate   Fluticasone  (Wixela Inhub )  Gabapentin  (Neurontin )  Hydroxyzine  (Atarax )  Ipratropium (Duoneb)  Latanoprost  (Xalatan )  Lisinopril  (Zestril )  Lubiprostone  (Amitiza )  Magnesium  (Mag-Ox)  Meloxicam  (Mobic )  Montelukast  (Singulair )  Naloxone  (Narcan )  Omeprazole  (Prilosec)  Ondansetron  (Zofran )  Paliperidone  (Invega )  Polyethylene Glycol (MiraLAX)  Prazosin (Minipress)  Probiotic  Salmeterol (Wixela Inhub )  Sertraline  (Zoloft )  Sucralfate  (Carafate )  Supplement (Fiber)  Trazodone (Desyrel)  Vitamin B ==================================================================== For clinical consultation, please call [phone]. ====================================================================       ROS  Constitutional: Denies any fever or chills Gastrointestinal: No reported hemesis, hematochezia, vomiting, or acute GI distress Musculoskeletal: Denies any acute onset joint swelling, redness, loss of ROM, or weakness Neurological: No reported episodes of acute onset apraxia, aphasia, dysarthria, agnosia, amnesia, paralysis, loss of coordination, or loss of consciousness  Medication Review  Benefiber, Culturelle, Magnesium  Oxide -Mg Supplement, albuterol , atenolol , b complex vitamins, busPIRone , dimenhyDRINATE , fluticasone -salmeterol, gabapentin , hydrOXYzine , ipratropium-albuterol , latanoprost , linaclotide , lisinopril , meloxicam , mirabegron  ER, montelukast , naloxone , omeprazole , ondansetron , paliperidone , polyethylene glycol, prazosin, sertraline , sucralfate , traMADol , and traZODone  History Review  Allergy: Rachael West is allergic to latex, penicillins, codeine, cortisone, and sulfa antibiotics. Drug:  Rachael West  reports no history of drug use. Alcohol:  reports no history of alcohol use. Tobacco:  reports that she quit smoking about 33 years ago. Her smoking use included cigarettes. She started  smoking about 53 years ago. She has a 30 pack-year smoking history. She has been exposed to tobacco smoke. She has never used smokeless tobacco. Social: Rachael West  reports that she quit smoking about 33 years ago. Her smoking use included cigarettes. She started smoking about 53 years ago. She has a 30 pack-year smoking history. She has been exposed to tobacco smoke. She has never used smokeless tobacco. She reports that she does not drink alcohol and does not use drugs. 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 Surgical: Rachael West  has a past surgical history that includes Appendectomy; Trigger finger release; Esophagogastroduodenoscopy (01/17/2011); Colonoscopy (01/17/2011); Abdominal hysterectomy; and Breast biopsy (Left, 2015). Family: family history includes Allergies in her mother; Asthma in her mother and sister; Breast cancer in her cousin; Colon cancer in her paternal aunt; Diabetes in her maternal grandfather and maternal grandmother; Heart disease in her brother, father, paternal grandfather, and paternal grandmother; Liver cancer in her paternal uncle; Ovarian cancer in an other family member; Pancreatic cancer in an other family member.  Laboratory Chemistry Profile   Renal Lab Results  Component Value Date   BUN 8 03/05/2023   CREATININE 0.81 03/05/2023   BCR 10 (L) 03/05/2023   GFRAA >60 08/05/2019   GFRNONAA >60 08/12/2020    Hepatic Lab Results  Component Value Date   AST 18 03/05/2023   ALT 12 03/05/2023   ALBUMIN 3.9 03/05/2023   ALKPHOS 118 03/05/2023   HCVAB REACTIVE (A) 01/12/2014   LIPASE 34 10/31/2010    Electrolytes Lab Results  Component Value Date   NA 140 03/05/2023    K 4.6 03/05/2023   CL 100 03/05/2023   CALCIUM  9.2 03/05/2023   MG 2.2 08/29/2017    Bone Lab Results  Component Value Date   VD25OH 16.7 (L) 03/06/2022   25OHVITD1 21 (L) 08/29/2017   25OHVITD2 <1.0 08/29/2017   25OHVITD3 21 08/29/2017    Inflammation (CRP: Acute Phase) (ESR: Chronic Phase) Lab Results  Component Value Date   CRP 12 (H) 08/29/2017   ESRSEDRATE 46 (H) 08/29/2017         Note: Above Lab results reviewed.  Recent Imaging Review  NM Hepato W/EF CLINICAL DATA:  Chronic nausea abdominal pain.  EXAM: NUCLEAR MEDICINE HEPATOBILIARY IMAGING WITH GALLBLADDER EF  TECHNIQUE: Sequential images of the abdomen were obtained out to 60 minutes following intravenous administration of radiopharmaceutical. After oral ingestion of Ensure, gallbladder ejection fraction was determined. At 60 min, normal ejection fraction is greater than 33%.  RADIOPHARMACEUTICALS:  5.1 mCi Tc-46m  Choletec  IV  COMPARISON:  Ultrasound February 25, 2021 CT February 02, 2015  FINDINGS: Prompt uptake and biliary excretion of activity by the liver is seen. Gallbladder activity is visualized, consistent with patency of cystic duct. Biliary activity passes into small bowel, consistent with patent common bile duct.  Calculated gallbladder ejection fraction is 94%. (Normal gallbladder ejection fraction with Ensure is greater than 33% and less than 80%.)  IMPRESSION: 1.  Patent cystic and common bile ducts.  2. Elevated gallbladder ejection fraction as can be seen with gallbladder hyperkinesia.  Electronically Signed   By: Tama Fails M.D.   On: 02/15/2023 16:49 Note: Reviewed        Physical Exam  General appearance: Well nourished, well developed, and well hydrated. In no apparent acute distress Mental status: Alert, oriented x 3 (person, place, & time)       Respiratory: No evidence of acute respiratory distress Eyes: PERLA Vitals: There were no vitals taken for this  visit. BMI: Estimated body mass index is 33.16 kg/m as calculated from the following:   Height as of 03/12/23: 5\' 5"  (1.651 m).   Weight as of 03/12/23: 199 lb 4 oz (90.4 kg). Ideal: Patient weight not recorded  Assessment   Diagnosis Status  1. Chronic low back pain (1ry area of Pain) (Bilateral) (L>R) w/o sciatica   2. Degeneration of intervertebral disc of lumbar region, unspecified whether pain present   3. Lumbar facet joint pain   4. Lumbar facet arthropathy   5. Lumbar facet syndrome (Bilateral) (L>R)   6. Osteoarthrosis of multiple sites   7. Thoracolumbar dextroscoliosis   8. Low back pain of over 3 months duration   9. Intermittent low back pain   10. Mechanical low back pain   11. Multifactorial low back pain   12. Abnormal MRI, lumbar spine (05/17/2022)    Controlled Controlled Controlled   Updated Problems: Problem  Low Back Pain of Over 3 Months Duration  Intermittent Low Back Pain  Mechanical Low Back Pain  Multifactorial Low Back Pain  Osteoarthritis of shoulder (Right)    Plan of Care  Problem-specific:  Assessment and Plan            Rachael West has a current medication list which includes the following long-term medication(s): albuterol , atenolol , gabapentin , ipratropium-albuterol , lisinopril , magnesium  oxide -mg supplement, meloxicam , mirabegron  er, montelukast , naloxone , omeprazole , paliperidone , prazosin, sertraline , sucralfate , tramadol , trazodone, benefiber, and wixela inhub .  Pharmacotherapy (Medications Ordered): No orders of the defined types were placed in this encounter.  Orders:  No orders of the defined types were placed in this encounter.  Follow-up plan:   No follow-ups on file.     Interventional Therapies  Risk Factors  Complex Considerations:   Allergy: LATEX  Hx Hepatitis C (w/ cirrhosis)      Planned  Pending:      Under consideration:   Diagnostic/therapeutic left cervical ESI #2  Therapeutic left  caudal ESI #2  Therapeutic right cervical ESI #3  Therapeutic right Racz procedure #1  Diagnostic right suprascapular NB #1  Therapeutic bilateral lumbar facet RFA #1 (starting on left side)  Therapeutic bilateral cervical facet RFA #1 (starting on left side)    Completed:   Diagnostic/therapeutic left L4-5 LESI x1 (06/14/2024) (100/100/90/90)  Therapeutic right glenohumeral/AC  inj. x4 (12/08/2021) (100/100/100/100)  Therapeutic bilateral lumbar facet MBB x4 (01/23/2023) (100/100/60/60)  Diagnostic bilateral cervical facet MBB x1 (11/06/2017) (100/100/90)  Diagnostic left caudal ESI x1 (08/14/2019) (100/100/100 x8 days/90-100)  Therapeutic right cervical ESI x3 (04/12/2021) (100/100/80-95/80-95)  Therapeutic left cervical ESI x1 (07/19/2021) (100/100/100/100) (helped neck, shoulders, headache, and arms)    Therapeutic  Palliative (PRN) options:   Therapeutic/palliative glenohumeral/AC  inj.   Therapeutic/palliative lumbar facet MBB   Therapeutic/palliative cervical facet MBB   Therapeutic/palliative caudal ESI   Therapeutic/palliative cervical ESI    Pharmacotherapy:  Nonopioids transfer 10/27/2019: Benefiber, Amitiza , magnesium , Mobic , and Neurontin       Recent Visits Date Type Provider Dept  02/08/23 Office Visit Renaldo Caroli, MD [facility]  Showing recent  visits within past 90 days and meeting all other requirements Future Appointments Date Type Provider Dept  05/09/23 Appointment Renaldo Caroli, MD [facility]  Showing future appointments within next 90 days and meeting all other requirements  I discussed the assessment and treatment plan with the patient. The patient was provided an opportunity to ask questions and all were answered. The patient agreed with the plan and demonstrated an understanding of the instructions.  Patient advised to call back or seek an in-person evaluation if the symptoms or condition worsens.  Duration of encounter:  *** minutes.  Total time on encounter, as per AMA guidelines included both the face-to-face and non-face-to-face time personally spent by the physician and/or other qualified health care professional(s) on the day of the encounter (includes time in activities that require the physician or other qualified health care professional and does not include time in activities normally performed by clinical staff). Physician's time may include the following activities when performed: Preparing to see the patient (e.g., pre-charting review of records, searching for previously ordered imaging, lab work, and nerve conduction tests) Review of prior analgesic pharmacotherapies. Reviewing PMP Interpreting ordered tests (e.g., lab work, imaging, nerve conduction tests) Performing post-procedure evaluations, including interpretation of diagnostic procedures Obtaining and/or reviewing separately obtained history Performing a medically appropriate examination and/or evaluation Counseling and educating the patient/family/caregiver Ordering medications, tests, or procedures Referring and communicating with other health care professionals (when not separately reported) Documenting clinical information in the electronic or other health record Independently interpreting results (not separately reported) and communicating results to the patient/ family/caregiver Care coordination (not separately reported)  Note by: Candi Chafe, MD (TTS and AI technology used. I apologize for any typographical errors that were not detected and corrected.) Date: 05/09/2023; Time: 7:54 AM

## 2023-05-09 ENCOUNTER — Encounter: Payer: Self-pay | Admitting: Pain Medicine

## 2023-05-09 ENCOUNTER — Ambulatory Visit: Attending: Pain Medicine | Admitting: Pain Medicine

## 2023-05-09 VITALS — BP 135/72 | HR 91 | Temp 98.4°F | Resp 16 | Ht 65.0 in | Wt 198.0 lb

## 2023-05-09 DIAGNOSIS — M51369 Other intervertebral disc degeneration, lumbar region without mention of lumbar back pain or lower extremity pain: Secondary | ICD-10-CM | POA: Diagnosis not present

## 2023-05-09 DIAGNOSIS — M47816 Spondylosis without myelopathy or radiculopathy, lumbar region: Secondary | ICD-10-CM

## 2023-05-09 DIAGNOSIS — G8929 Other chronic pain: Secondary | ICD-10-CM | POA: Diagnosis not present

## 2023-05-09 DIAGNOSIS — M1991 Primary osteoarthritis, unspecified site: Secondary | ICD-10-CM | POA: Diagnosis not present

## 2023-05-09 DIAGNOSIS — M545 Low back pain, unspecified: Secondary | ICD-10-CM

## 2023-05-09 DIAGNOSIS — M4180 Other forms of scoliosis, site unspecified: Secondary | ICD-10-CM

## 2023-05-09 DIAGNOSIS — R937 Abnormal findings on diagnostic imaging of other parts of musculoskeletal system: Secondary | ICD-10-CM

## 2023-05-09 DIAGNOSIS — M5459 Other low back pain: Secondary | ICD-10-CM | POA: Diagnosis not present

## 2023-05-09 NOTE — Patient Instructions (Signed)

## 2023-05-15 ENCOUNTER — Ambulatory Visit
Admission: RE | Admit: 2023-05-15 | Discharge: 2023-05-15 | Disposition: A | Discharge status: Home / Self Care | Source: Ambulatory Visit | Attending: Pain Medicine | Admitting: Pain Medicine

## 2023-05-15 ENCOUNTER — Encounter: Payer: Self-pay | Admitting: Pain Medicine

## 2023-05-15 ENCOUNTER — Ambulatory Visit: Attending: Pain Medicine | Admitting: Pain Medicine

## 2023-05-15 VITALS — BP 133/70 | HR 72 | Temp 97.3°F | Resp 18 | Ht 65.0 in | Wt 199.0 lb

## 2023-05-15 DIAGNOSIS — R937 Abnormal findings on diagnostic imaging of other parts of musculoskeletal system: Secondary | ICD-10-CM

## 2023-05-15 DIAGNOSIS — G8929 Other chronic pain: Secondary | ICD-10-CM | POA: Diagnosis present

## 2023-05-15 DIAGNOSIS — M545 Low back pain, unspecified: Secondary | ICD-10-CM | POA: Diagnosis not present

## 2023-05-15 DIAGNOSIS — M5459 Other low back pain: Secondary | ICD-10-CM | POA: Diagnosis present

## 2023-05-15 DIAGNOSIS — M47816 Spondylosis without myelopathy or radiculopathy, lumbar region: Secondary | ICD-10-CM

## 2023-05-15 DIAGNOSIS — M4180 Other forms of scoliosis, site unspecified: Secondary | ICD-10-CM | POA: Diagnosis present

## 2023-05-15 DIAGNOSIS — M51369 Other intervertebral disc degeneration, lumbar region without mention of lumbar back pain or lower extremity pain: Secondary | ICD-10-CM | POA: Diagnosis present

## 2023-05-15 DIAGNOSIS — M47817 Spondylosis without myelopathy or radiculopathy, lumbosacral region: Secondary | ICD-10-CM | POA: Diagnosis not present

## 2023-05-15 DIAGNOSIS — M1991 Primary osteoarthritis, unspecified site: Secondary | ICD-10-CM

## 2023-05-15 MED ORDER — LIDOCAINE HCL 2 % IJ SOLN
INTRAMUSCULAR | Status: AC
  Filled 2023-05-15: qty 20

## 2023-05-15 MED ORDER — LIDOCAINE HCL 2 % IJ SOLN
20.0000 mL | Freq: Once | INTRAMUSCULAR | Status: AC
Start: 2023-05-15 — End: 2023-05-15
  Administered 2023-05-15: 400 mg

## 2023-05-15 MED ORDER — ROPIVACAINE HCL 2 MG/ML IJ SOLN
18.0000 mL | Freq: Once | INTRAMUSCULAR | Status: AC
  Administered 2023-05-15: 18 mL via PERINEURAL

## 2023-05-15 MED ORDER — MIDAZOLAM HCL 5 MG/5ML IJ SOLN
0.5000 mg | Freq: Once | INTRAMUSCULAR | Status: AC
  Administered 2023-05-15: 2 mg via INTRAVENOUS

## 2023-05-15 MED ORDER — ROPIVACAINE HCL 2 MG/ML IJ SOLN
INTRAMUSCULAR | Status: AC
  Filled 2023-05-15: qty 20

## 2023-05-15 MED ORDER — TRIAMCINOLONE ACETONIDE 40 MG/ML IJ SUSP
80.0000 mg | Freq: Once | INTRAMUSCULAR | Status: AC
  Administered 2023-05-15: 80 mg

## 2023-05-15 MED ORDER — FENTANYL CITRATE (PF) 100 MCG/2ML IJ SOLN
INTRAMUSCULAR | Status: AC
Start: 2023-05-15 — End: ?
  Filled 2023-05-15: qty 2

## 2023-05-15 MED ORDER — PENTAFLUOROPROP-TETRAFLUOROETH EX AERO
INHALATION_SPRAY | Freq: Once | CUTANEOUS | Status: AC
Start: 2023-05-15 — End: 2023-05-15
  Administered 2023-05-15: 30 via TOPICAL

## 2023-05-15 MED ORDER — FENTANYL CITRATE (PF) 100 MCG/2ML IJ SOLN
25.0000 ug | INTRAMUSCULAR | Status: DC | PRN
Start: 2023-05-15 — End: 2023-05-15

## 2023-05-15 MED ORDER — MIDAZOLAM HCL 5 MG/5ML IJ SOLN
INTRAMUSCULAR | Status: AC
  Filled 2023-05-15: qty 5

## 2023-05-15 MED ORDER — TRIAMCINOLONE ACETONIDE 40 MG/ML IJ SUSP
INTRAMUSCULAR | Status: AC
  Filled 2023-05-15: qty 2

## 2023-05-15 NOTE — Patient Instructions (Addendum)
 ______________________________________________________________________    Post-Procedure Discharge Instructions  Instructions: Apply ice:  Purpose: This will minimize any swelling and discomfort after procedure.  When: Day of procedure, as soon as you get home. How: Fill a plastic sandwich bag with crushed ice. Cover it with a small towel and apply to injection site. How long: (15 min on, 15 min off) Apply for 15 minutes then remove x 15 minutes.  Repeat sequence on day of procedure, until you go to bed. Apply heat:  Purpose: To treat any soreness and discomfort from the procedure. When: Starting the next day after the procedure. How: Apply heat to procedure site starting the day following the procedure. How long: May continue to repeat daily, until discomfort goes away. Food intake: Start with clear liquids (like water) and advance to regular food, as tolerated.  Physical activities: Keep activities to a minimum for the first 8 hours after the procedure. After that, then as tolerated. Driving: If you have received any sedation, be responsible and do not drive. You are not allowed to drive for 24 hours after having sedation. Blood thinner: (Applies only to those taking blood thinners) You may restart your blood thinner 6 hours after your procedure. Insulin: (Applies only to Diabetic patients taking insulin) As soon as you can eat, you may resume your normal dosing schedule. Infection prevention: Keep procedure site clean and dry. Shower daily and clean area with soap and water. Post-procedure Pain Diary: Extremely important that this be done correctly and accurately. Recorded information will be used to determine the next step in treatment. For the purpose of accuracy, follow these rules: Evaluate only the area treated. Do not report or include pain from an untreated area. For the purpose of this evaluation, ignore all other areas of pain, except for the treated area. After your procedure,  avoid taking a long nap and attempting to complete the pain diary after you wake up. Instead, set your alarm clock to go off every hour, on the hour, for the initial 8 hours after the procedure. Document the duration of the numbing medicine, and the relief you are getting from it. Do not go to sleep and attempt to complete it later. It will not be accurate. If you received sedation, it is likely that you were given a medication that may cause amnesia. Because of this, completing the diary at a later time may cause the information to be inaccurate. This information is needed to plan your care. Follow-up appointment: Keep your post-procedure follow-up evaluation appointment after the procedure (usually 2 weeks for most procedures, 6 weeks for radiofrequencies). DO NOT FORGET to bring you pain diary with you.   Expect: (What should I expect to see with my procedure?) From numbing medicine (AKA: Local Anesthetics): Numbness or decrease in pain. You may also experience some weakness, which if present, could last for the duration of the local anesthetic. Onset: Full effect within 15 minutes of injected. Duration: It will depend on the type of local anesthetic used. On the average, 1 to 8 hours.  From steroids (Applies only if steroids were used): Decrease in swelling or inflammation. Once inflammation is improved, relief of the pain will follow. Onset of benefits: Depends on the amount of swelling present. The more swelling, the longer it will take for the benefits to be seen. In some cases, up to 10 days. Duration: Steroids will stay in the system x 2 weeks. Duration of benefits will depend on multiple posibilities including persistent irritating factors. Side-effects: If  present, they may typically last 2 weeks (the duration of the steroids). Frequent: Cramps (if they occur, drink Gatorade and take over-the-counter Magnesium 450-500 mg once to twice a day); water retention with temporary weight gain;  increases in blood sugar; decreased immune system response; increased appetite. Occasional: Facial flushing (red, warm cheeks); mood swings; menstrual changes. Uncommon: Long-term decrease or suppression of natural hormones; bone thinning. (These are more common with higher doses or more frequent use. This is why we prefer that our patients avoid having any injection therapies in other practices.)  Very Rare: Severe mood changes; psychosis; aseptic necrosis. From procedure: Some discomfort is to be expected once the numbing medicine wears off. This should be minimal if ice and heat are applied as instructed.  Call if: (When should I call?) You experience numbness and weakness that gets worse with time, as opposed to wearing off. New onset bowel or bladder incontinence. (Applies only to procedures done in the spine)  Emergency Numbers: Durning business hours (Monday - Thursday, 8:00 AM - 4:00 PM) (Friday, 9:00 AM - 12:00 Noon): [phone] After hours: [phone] NOTE: If you are having a problem and are unable connect with, or to talk to a provider, then go to your nearest urgent care or emergency department. If the problem is serious and urgent, please call 911. ______________________________________________________________________     Moderate Conscious Sedation, Adult, Care After After the procedure, it is common to have: Sleepiness for a few hours. Impaired judgment for a few hours. Trouble with balance. Nausea or vomiting if you eat too soon. Follow these instructions at home: For the time period you were told by your health care provider:  Rest. Do not participate in activities where you could fall or become injured. Do not drive or use machinery. Do not drink alcohol. Do not take sleeping pills or medicines that cause drowsiness. Do not make important decisions or sign legal documents. Do not take care of children on your own. Eating and drinking Follow instructions  from your health care provider about what you may eat and drink. Drink enough fluid to keep your urine pale yellow. If you vomit: Drink clear fluids slowly and in small amounts as you are able. Clear fluids include water, ice chips, low-calorie sports drinks, and fruit juice that has water added to it (diluted fruit juice). Eat light and bland foods in small amounts as you are able. These foods include bananas, applesauce, rice, lean meats, toast, and crackers. General instructions Take over-the-counter and prescription medicines only as told by your health care provider. Have a responsible adult stay with you for the time you are told. Do not use any products that contain nicotine  or tobacco. These products include cigarettes, chewing tobacco, and vaping devices, such as e-cigarettes. If you need help quitting, ask your health care provider. Return to your normal activities as told by your health care provider. Ask your health care provider what activities are safe for you. Your health care provider may give you more instructions. Make sure you know what you can and cannot do. Contact a health care provider if: You are still sleepy or having trouble with balance after 24 hours. You feel light-headed. You vomit every time you eat or drink. You get a rash. You have a fever. You have redness or swelling around the IV site. Get help right away if: You have trouble breathing. You start to feel confused at home. These symptoms may be an emergency. Get help right away. Call  911. Do not wait to see if the symptoms will go away. Do not drive yourself to the hospital. This information is not intended to replace advice given to you by your health care provider. Make sure you discuss any questions you have with your health care provider. Document Revised: 07/11/2021 Document Reviewed: 07/11/2021 [employer] Patient Education  2024 [employer]   Moderate Conscious Sedation, Adult, Care After After  the procedure, it is common to have: Sleepiness for a few hours. Impaired judgment for a few hours. Trouble with balance. Nausea or vomiting if you eat too soon. Follow these instructions at home: For the time period you were told by your health care provider:  Rest. Do not participate in activities where you could fall or become injured. Do not drive or use machinery. Do not drink alcohol. Do not take sleeping pills or medicines that cause drowsiness. Do not make important decisions or sign legal documents. Do not take care of children on your own. Eating and drinking Follow instructions from your health care provider about what you may eat and drink. Drink enough fluid to keep your urine pale yellow. If you vomit: Drink clear fluids slowly and in small amounts as you are able. Clear fluids include water, ice chips, low-calorie sports drinks, and fruit juice that has water added to it (diluted fruit juice). Eat light and bland foods in small amounts as you are able. These foods include bananas, applesauce, rice, lean meats, toast, and crackers. General instructions Take over-the-counter and prescription medicines only as told by your health care provider. Have a responsible adult stay with you for the time you are told. Do not use any products that contain nicotine  or tobacco. These products include cigarettes, chewing tobacco, and vaping devices, such as e-cigarettes. If you need help quitting, ask your health care provider. Return to your normal activities as told by your health care provider. Ask your health care provider what activities are safe for you. Your health care provider may give you more instructions. Make sure you know what you can and cannot do. Contact a health care provider if: You are still sleepy or having trouble with balance after 24 hours. You feel light-headed. You vomit every time you eat or drink. You get a rash. You have a fever. You have redness or  swelling around the IV site. Get help right away if: You have trouble breathing. You start to feel confused at home. These symptoms may be an emergency. Get help right away. Call 911. Do not wait to see if the symptoms will go away. Do not drive yourself to the hospital. This information is not intended to replace advice given to you by your health care provider. Make sure you discuss any questions you have with your health care provider. Document Revised: 07/11/2021 Document Reviewed: 07/11/2021 [employer] Patient Education  2024 [employer].

## 2023-05-15 NOTE — Progress Notes (Signed)
 PROVIDER NOTE: Interpretation of information contained herein should be left to medically-trained personnel. Specific patient instructions are provided elsewhere under "Patient Instructions" section of medical record. This document was created in part using STT-dictation technology, any transcriptional errors that may result from this process are unintentional.  Patient: Rachael West Type: Established DOB: 03-Oct-1955 MRN: [id number] PCP: Noreene Bearded, PA  Service: Procedure DOS: 05/15/2023 Setting: Ambulatory Location: Ambulatory outpatient facility Delivery: Face-to-face Provider: Candi Chafe, MD Specialty: Interventional Pain Management Specialty designation: 09 Location: Outpatient facility Ref. Prov.: Renaldo Caroli, MD       Interventional Therapy   Type: Lumbar Facet, Medial Branch Block(s)   #5  Laterality: Bilateral  Level: L2, L3, L4, L5, and S1 Medial Branch Level(s). Injecting these levels blocks the L3-4, L4-5, and L5-S1 lumbar facet joints.  Imaging: Fluoroscopic guidance Spinal ([id number]) Anesthesia: Local anesthesia (1-2% Lidocaine ) Anxiolysis: IV Versed  2.0 mg Sedation: Moderate Sedation None required. No Fentanyl  administered.         DOS: 05/15/2023 Performed by: Candi Chafe, MD  Primary Purpose: Diagnostic/Therapeutic Indications: Low back pain severe enough to impact quality of life or function. 1. Chronic low back pain (1ry area of Pain) (Bilateral) (L>R) w/o sciatica   2. Lumbar facet joint pain   3. Lumbar facet syndrome (Bilateral) (L>R)   4. Lumbar facet arthropathy   5. Low back pain of over 3 months duration   6. Mechanical low back pain   7. Multifactorial low back pain   8. Spondylosis without myelopathy or radiculopathy, lumbar region    NAS-11 Pain score:   Pre-procedure: 6 /10   Post-procedure: 0-No pain/10     Position / Prep / Materials:  Position: Prone  Prep solution: ChloraPrep (2% chlorhexidine gluconate  and 70% isopropyl alcohol) Area Prepped: Posterolateral Lumbosacral Spine (Wide prep: From the lower border of the scapula down to the end of the tailbone and from flank to flank.)  Materials:  Tray: Block Needle(s):  Type: Spinal  Gauge (G): 22  Length: 3.5-in Qty: 4     H&P (Pre-op Assessment):  Rachael West is a 68 y.o. (year old), female patient, seen today for interventional treatment. She  has a past surgical history that includes Appendectomy; Trigger finger release; Esophagogastroduodenoscopy (01/17/2011); Colonoscopy (01/17/2011); Abdominal hysterectomy; and Breast biopsy (Left, 2015). Rachael West has a current medication list which includes the following prescription(s): albuterol , atenolol , b complex vitamins, buspirone , dimenhydrinate , gabapentin , hydroxyzine , ipratropium-albuterol , culturelle, latanoprost , linaclotide , lisinopril , magnesium  oxide -mg supplement, meloxicam , mirabegron  er, montelukast , naloxone , omeprazole , ondansetron , paliperidone , polyethylene glycol, prazosin, sertraline , sucralfate , tramadol , trazodone, wixela inhub , and benefiber, and the following Facility-Administered Medications: fentanyl . Her primarily concern today is the Back Pain (lower)  Initial Vital Signs:  Pulse/HCG Rate: 76ECG Heart Rate: 70 (nsr) Temp: (!) 97.3 F (36.3 C) Resp: 16 BP: 129/70 SpO2: 98 %  BMI: Estimated body mass index is 33.12 kg/m as calculated from the following:   Height as of this encounter: 5\' 5"  (1.651 m).   Weight as of this encounter: 199 lb (90.3 kg).  Risk Assessment: Allergies: Reviewed. She is allergic to latex, penicillins, codeine, cortisone, and sulfa antibiotics.  Allergy Precautions: None required Coagulopathies: Reviewed. None identified.  Blood-thinner therapy: None at this time Active Infection(s): Reviewed. None identified. Rachael West is afebrile  Site Confirmation: Rachael West was asked to confirm the procedure and laterality  before marking the site Procedure checklist: Completed Consent: Before the procedure and under the influence of no sedative(s), amnesic(s), or anxiolytics, the patient  was informed of the treatment options, risks and possible complications. To fulfill our ethical and legal obligations, as recommended by the American Medical Association's Code of Ethics, I have informed the patient of my clinical impression; the nature and purpose of the treatment or procedure; the risks, benefits, and possible complications of the intervention; the alternatives, including doing nothing; the risk(s) and benefit(s) of the alternative treatment(s) or procedure(s); and the risk(s) and benefit(s) of doing nothing. The patient was provided information about the general risks and possible complications associated with the procedure. These may include, but are not limited to: failure to achieve desired goals, infection, bleeding, organ or nerve damage, allergic reactions, paralysis, and death. In addition, the patient was informed of those risks and complications associated to Spine-related procedures, such as failure to decrease pain; infection (i.e.: Meningitis, epidural or intraspinal abscess); bleeding (i.e.: epidural hematoma, subarachnoid hemorrhage, or any other type of intraspinal or peri-dural bleeding); organ or nerve damage (i.e.: Any type of peripheral nerve, nerve root, or spinal cord injury) with subsequent damage to sensory, motor, and/or autonomic systems, resulting in permanent pain, numbness, and/or weakness of one or several areas of the body; allergic reactions; (i.e.: anaphylactic reaction); and/or death. Furthermore, the patient was informed of those risks and complications associated with the medications. These include, but are not limited to: allergic reactions (i.e.: anaphylactic or anaphylactoid reaction(s)); adrenal axis suppression; blood sugar elevation that in diabetics may result in ketoacidosis or comma;  water retention that in patients with history of congestive heart failure may result in shortness of breath, pulmonary edema, and decompensation with resultant heart failure; weight gain; swelling or edema; medication-induced neural toxicity; particulate matter embolism and blood vessel occlusion with resultant organ, and/or nervous system infarction; and/or aseptic necrosis of one or more joints. Finally, the patient was informed that Medicine is not an exact science; therefore, there is also the possibility of unforeseen or unpredictable risks and/or possible complications that may result in a catastrophic outcome. The patient indicated having understood very clearly. We have given the patient no guarantees and we have made no promises. Enough time was given to the patient to ask questions, all of which were answered to the patient's satisfaction. Rachael West has indicated that she wanted to continue with the procedure. Attestation: I, the ordering provider, attest that I have discussed with the patient the benefits, risks, side-effects, alternatives, likelihood of achieving goals, and potential problems during recovery for the procedure that I have provided informed consent. Date  Time: 05/15/2023 11:10 AM  Pre-Procedure Preparation:  Monitoring: As per clinic protocol. Respiration, ETCO2, SpO2, BP, heart rate and rhythm monitor placed and checked for adequate function Safety Precautions: Patient was assessed for positional comfort and pressure points before starting the procedure. Time-out: I initiated and conducted the "Time-out" before starting the procedure, as per protocol. The patient was asked to participate by confirming the accuracy of the "Time Out" information. Verification of the correct person, site, and procedure were performed and confirmed by me, the nursing staff, and the patient. "Time-out" conducted as per Joint Commission's Universal Protocol (UP.01.01.01). Time: 1205 Start  Time: 1205 hrs.  Description of Procedure:          Laterality: (see above) Targeted Levels: (see above)  Safety Precautions: Aspiration looking for blood return was conducted prior to all injections. At no point did we inject any substances, as a needle was being advanced. Before injecting, the patient was told to immediately notify me if she was experiencing any new  onset of "ringing in the ears, or metallic taste in the mouth". No attempts were made at seeking any paresthesias. Safe injection practices and needle disposal techniques used. Medications properly checked for expiration dates. SDV (single dose vial) medications used. After the completion of the procedure, all disposable equipment used was discarded in the proper designated medical waste containers. Local Anesthesia: Protocol guidelines were followed. The patient was positioned over the fluoroscopy table. The area was prepped in the usual manner. The time-out was completed. The target area was identified using fluoroscopy. A 12-in long, straight, sterile hemostat was used with fluoroscopic guidance to locate the targets for each level blocked. Once located, the skin was marked with an approved surgical skin marker. Once all sites were marked, the skin (epidermis, dermis, and hypodermis), as well as deeper tissues (fat, connective tissue and muscle) were infiltrated with a small amount of a short-acting local anesthetic, loaded on a 10cc syringe with a 25G, 1.5-in  Needle. An appropriate amount of time was allowed for local anesthetics to take effect before proceeding to the next step. Local Anesthetic: Lidocaine  2.0% The unused portion of the local anesthetic was discarded in the proper designated containers. Technical description of process:  L2 Medial Branch Nerve Block (MBB): The target area for the L2 medial branch is at the junction of the postero-lateral aspect of the superior articular process and the superior, posterior, and medial  edge of the transverse process of L3. Under fluoroscopic guidance, a Quincke needle was inserted until contact was made with os over the superior postero-lateral aspect of the pedicular shadow (target area). After negative aspiration for blood, 0.5 mL of the nerve block solution was injected without difficulty or complication. The needle was removed intact. L3 Medial Branch Nerve Block (MBB): The target area for the L3 medial branch is at the junction of the postero-lateral aspect of the superior articular process and the superior, posterior, and medial edge of the transverse process of L4. Under fluoroscopic guidance, a Quincke needle was inserted until contact was made with os over the superior postero-lateral aspect of the pedicular shadow (target area). After negative aspiration for blood, 0.5 mL of the nerve block solution was injected without difficulty or complication. The needle was removed intact. L4 Medial Branch Nerve Block (MBB): The target area for the L4 medial branch is at the junction of the postero-lateral aspect of the superior articular process and the superior, posterior, and medial edge of the transverse process of L5. Under fluoroscopic guidance, a Quincke needle was inserted until contact was made with os over the superior postero-lateral aspect of the pedicular shadow (target area). After negative aspiration for blood, 0.5 mL of the nerve block solution was injected without difficulty or complication. The needle was removed intact. L5 Medial Branch Nerve Block (MBB): The target area for the L5 medial branch is at the junction of the postero-lateral aspect of the superior articular process and the superior, posterior, and medial edge of the sacral ala. Under fluoroscopic guidance, a Quincke needle was inserted until contact was made with os over the superior postero-lateral aspect of the pedicular shadow (target area). After negative aspiration for blood, 0.5 mL of the nerve block solution  was injected without difficulty or complication. The needle was removed intact. S1 Medial Branch Nerve Block (MBB): The target area for the S1 medial branch is at the posterior and inferior 6 o'clock position of the L5-S1 facet joint. Under fluoroscopic guidance, the Quincke needle inserted for the L5 MBB was  redirected until contact was made with os over the inferior and postero aspect of the sacrum, at the 6 o' clock position under the L5-S1 facet joint (Target area). After negative aspiration for blood, 0.5 mL of the nerve block solution was injected without difficulty or complication. The needle was removed intact.  Once the entire procedure was completed, the treated area was cleaned, making sure to leave some of the prepping solution back to take advantage of its long term bactericidal properties.         Illustration of the posterior view of the lumbar spine and the posterior neural structures. Laminae of L2 through S1 are labeled. DPRL5, dorsal primary ramus of L5; DPRS1, dorsal primary ramus of S1; DPR3, dorsal primary ramus of L3; FJ, facet (zygapophyseal) joint L3-L4; I, inferior articular process of L4; LB1, lateral branch of dorsal primary ramus of L1; IAB, inferior articular branches from L3 medial branch (supplies L4-L5 facet joint); IBP, intermediate branch plexus; MB3, medial branch of dorsal primary ramus of L3; NR3, third lumbar nerve root; S, superior articular process of L5; SAB, superior articular branches from L4 (supplies L4-5 facet joint also); TP3, transverse process of L3.   Facet Joint Innervation (* possible contribution)  L1-2 T12, L1 (L2*)  Medial Branch  L2-3 L1, L2 (L3*)         "          "  L3-4 L2, L3 (L4*)         "          "  L4-5 L3, L4 (L5*)         "          "  L5-S1 L4, L5, S1          "          "    Vitals:   05/15/23 1205 05/15/23 1210 05/15/23 1212 05/15/23 1221  BP: (!) 138/90 132/85 132/85 133/70  Pulse:  72    Resp: 20 (!) 21 (!) 22 18   Temp:      TempSrc:      SpO2: 100% 100% 100% 99%  Weight:      Height:         End Time: 1212 hrs.  Imaging Guidance (Spinal):          Type of Imaging Technique: Fluoroscopy Guidance (Spinal) Indication(s): Fluoroscopy guidance for needle placement to enhance accuracy in procedures requiring precise needle localization for targeted delivery of medication in or near specific anatomical locations not easily accessible without such real-time imaging assistance. Exposure Time: Please see nurses notes. Contrast: None used. Fluoroscopic Guidance: I was personally present during the use of fluoroscopy. "Tunnel Vision Technique" used to obtain the best possible view of the target area. Parallax error corrected before commencing the procedure. "Direction-depth-direction" technique used to introduce the needle under continuous pulsed fluoroscopy. Once target was reached, antero-posterior, oblique, and lateral fluoroscopic projection used confirm needle placement in all planes. Images permanently stored in EMR. Interpretation: No contrast injected. I personally interpreted the imaging intraoperatively. Adequate needle placement confirmed in multiple planes. Permanent images saved into the patient's record.  Post-operative Assessment:  Post-procedure Vital Signs:  Pulse/HCG Rate: 7276 Temp: (!) 97.3 F (36.3 C) Resp: 18 BP: 133/70 SpO2: 99 %  EBL: None  Complications: No immediate post-treatment complications observed by team, or reported by patient.  Note: The patient tolerated the entire procedure well. A repeat set of vitals were taken after the procedure  and the patient was kept under observation following institutional policy, for this type of procedure. Post-procedural neurological assessment was performed, showing return to baseline, prior to discharge. The patient was provided with post-procedure discharge instructions, including a section on how to identify potential problems. Should  any problems arise concerning this procedure, the patient was given instructions to immediately contact us , at any time, without hesitation. In any case, we plan to contact the patient by telephone for a follow-up status report regarding this interventional procedure.  Comments:  No additional relevant information.  Plan of Care (POC)  Orders:  Orders Placed This Encounter  Procedures   LUMBAR FACET(MEDIAL BRANCH NERVE BLOCK) MBNB    Scheduling Instructions:     Procedure: Lumbar facet block (AKA.: Lumbosacral medial branch nerve block)     Side: Bilateral     Level: L3-4, L4-5, and L5-S1 Facets (L2, L3, L4, L5, and S1 Medial Branch Nerves)     Sedation: Patient's choice.     Timeframe: Today    Where will this procedure be performed?:   [facility]   [facility] C-ARM 1-60 MIN NO REPORT    Intraoperative interpretation by procedural physician at [facility].    Standing Status:   Standing    Number of Occurrences:   1    Reason for exam::   Assistance in needle guidance and placement for procedures requiring needle placement in or near specific anatomical locations not easily accessible without such assistance.   Informed Consent Details: Physician/Practitioner Attestation; Transcribe to consent form and obtain patient signature    Nursing Order: Transcribe to consent form and obtain patient signature. Note: Always confirm laterality of pain with Ms. Khouri, before procedure.    Physician/Practitioner attestation of informed consent for procedure/surgical case:   I, the physician/practitioner, attest that I have discussed with the patient the benefits, risks, side effects, alternatives, likelihood of achieving goals and potential problems during recovery for the procedure that I have provided informed consent.    Procedure:   Lumbar Facet Block  under fluoroscopic guidance    Physician/Practitioner performing the procedure:   Keyaria Lawson A. Barth Borne MD     Indication/Reason:   Low Back Pain, with our without leg pain, due to Facet Joint Arthralgia (Joint Pain) Spondylosis (Arthritis of the Spine), without myelopathy or radiculopathy (Nerve Damage).   Provide equipment / supplies at bedside    Procedure tray: "Block Tray" (Disposable  single use) Skin infiltration needle: Regular 1.5-in, 25-G, (x1) Block Needle type: Spinal Amount/quantity: 4 Size: Regular (3.5-inch) Gauge: 22G    Standing Status:   Standing    Number of Occurrences:   1    Specify:   Block Tray   Saline lock IV    Have LR [phone] mL available and administer at 125 mL/hr if patient becomes hypotensive.    Standing Status:   Standing    Number of Occurrences:   1   Latex precautions    Activate Latex-Free Protocol.    Standing Status:   Standing    Number of Occurrences:   1   Chronic Opioid Analgesic:   Tramadol  50 mg, 1-2 tabs (50-100 mg) PO q 6 hrs (400 mg/day of tramadol ) MME/day: 40 mg/day.    Medications ordered for procedure: Meds ordered this encounter  Medications   lidocaine  (XYLOCAINE ) 2 % (with pres) injection 400 mg   pentafluoroprop-tetrafluoroeth (GEBAUERS) aerosol   midazolam  (VERSED ) 5 MG/5ML injection 0.5-2 mg    Make sure Flumazenil is  available in the pyxis when using this medication. If oversedation occurs, administer 0.2 mg IV over 15 sec. If after 45 sec no response, administer 0.2 mg again over 1 min; may repeat at 1 min intervals; not to exceed 4 doses (1 mg)   fentaNYL  (SUBLIMAZE ) injection 25-50 mcg    Make sure Narcan  is available in the pyxis when using this medication. In the event of respiratory depression (RR< 8/min): Titrate NARCAN  (naloxone ) in increments of 0.1 to 0.2 mg IV at 2-3 minute intervals, until desired degree of reversal.   ropivacaine  (PF) 2 mg/mL (0.2%) (NAROPIN ) injection 18 mL   triamcinolone  acetonide (KENALOG -40) injection 80 mg   Medications administered: We administered lidocaine ,  pentafluoroprop-tetrafluoroeth, midazolam , ropivacaine  (PF) 2 mg/mL (0.2%), and triamcinolone  acetonide.  See the medical record for exact dosing, route, and time of administration.  Follow-up plan:   Return in about 2 weeks (around 05/29/2023) for (Face2F), (PPE).       Interventional Therapies  Risk Factors  Complex Considerations:   Allergy: LATEX  Hx Hepatitis C (w/ cirrhosis)      Planned  Pending:   Therapeutic bilateral lumbar facet MBB #5    Under consideration:   Diagnostic/therapeutic left cervical ESI #2  Therapeutic left caudal ESI #2  Therapeutic right cervical ESI #3  Therapeutic right Racz procedure #1  Diagnostic right suprascapular NB #1  Therapeutic bilateral lumbar facet RFA #1 (starting on left side)  Therapeutic bilateral cervical facet RFA #1 (starting on left side)    Completed:   Diagnostic/therapeutic left L4-5 LESI x1 (06/14/2024) (100/100/90/90)  Therapeutic right glenohumeral/AC  inj. x4 (12/08/2021) (100/100/100/100)  Therapeutic bilateral lumbar facet MBB x4 (01/23/2023) (100/100/60/60)  Diagnostic bilateral cervical facet MBB x1 (11/06/2017) (100/100/90)  Diagnostic left caudal ESI x1 (08/14/2019) (100/100/100 x8 days/90-100)  Therapeutic right cervical ESI x3 (04/12/2021) (100/100/80-95/80-95)  Therapeutic left cervical ESI x1 (07/19/2021) (100/100/100/100) (helped neck, shoulders, headache, and arms)    Therapeutic  Palliative (PRN) options:   Therapeutic/palliative glenohumeral/AC  inj.   Therapeutic/palliative lumbar facet MBB   Therapeutic/palliative cervical facet MBB   Therapeutic/palliative caudal ESI   Therapeutic/palliative cervical ESI    Pharmacotherapy:  Nonopioids transfer 10/27/2019: Benefiber, Amitiza , magnesium , Mobic , and Neurontin        Recent Visits Date Type Provider Dept  05/09/23 Office Visit Renaldo Caroli, MD [facility]  Showing recent visits within past 90 days and meeting all other  requirements Today's Visits Date Type Provider Dept  05/15/23 Procedure visit Renaldo Caroli, MD [facility]  Showing today's visits and meeting all other requirements Future Appointments Date Type Provider Dept  05/30/23 Appointment Renaldo Caroli, MD [facility]  Showing future appointments within next 90 days and meeting all other requirements  Disposition: Discharge home  Discharge (Date  Time): 05/15/2023; 1230 hrs.   Primary Care Physician: Noreene Bearded, PA Location: [facility] Note by: Candi Chafe, MD (TTS technology used. I apologize for any typographical errors that were not detected and corrected.) Date: 05/15/2023; Time: 12:44 PM  Disclaimer:  Medicine is not an Visual merchandiser. The only guarantee in medicine is that nothing is guaranteed. It is important to note that the decision to proceed with this intervention was based on the information collected from the patient. The Data and conclusions were drawn from the patient's questionnaire, the interview, and the physical examination. Because the information was provided in large part by the patient, it cannot be guaranteed that it has not been purposely or  unconsciously manipulated. Every effort has been made to obtain as much relevant data as possible for this evaluation. It is important to note that the conclusions that lead to this procedure are derived in large part from the available data. Always take into account that the treatment will also be dependent on availability of resources and existing treatment guidelines, considered by other Pain Management Practitioners as being common knowledge and practice, at the time of the intervention. For Medico-Legal purposes, it is also important to point out that variation in procedural techniques and pharmacological choices are the acceptable norm. The indications, contraindications, technique, and results of the above  procedure should only be interpreted and judged by a Board-Certified Interventional Pain Specialist with extensive familiarity and expertise in the same exact procedure and technique.

## 2023-05-16 ENCOUNTER — Telehealth: Payer: Self-pay | Admitting: *Deleted

## 2023-05-16 NOTE — Telephone Encounter (Signed)
 No problems post procedure.

## 2023-05-22 NOTE — Progress Notes (Signed)
 Chief Complaint: This patient is seen in follow up   Fall Screening     History of Present Illness: Rachael West is a 68 y.o. female who presents today for follow up.  - Past medical history includes lymphoma, HTN, HLD, asthma, cirrhosis, hepatitis C, chronic constipation, obesity, chronic pain syndrome, fibromyalgia, back problems, anxiety, depression.  - GU / Urogyn History:  1. Stress incontinence (resolved). Underwent what sounds like anterior repair and sling formation many years ago by one of the gynecologists in [facility]. 2. OAB with frequency, urgency, and urge incontinence.   - Failed Detrol LA and Vesicare . 3. Previously evaluated for interstitial cystitis.  - Underwent cystoscopy with hydrodistention with Botox (100 units) on 05/29/2011 by Dr. Herb. Findings: 1200 ml bladder capacity. Not very helpful per patient. - Per Dr. Janit on 11/28/2018: She does not classically describe pain with bladder filling or refractory pain in the bladder at all.   She was seen for initial consultation on 11/28/2018 by Dr. Janit.  The plan was: - Myrbetriq  50 mg daily refills - Urodynamic testing to better define her urinary incontinence (not done). She continues to fail medicinal treatment for oab completed. UDS completed. Results listed below discussed.    08/11/2021 UDS: Physician Interpretation: Capacity: Normal Compliance: Normal Bladder sensation: Normal SUI present?: No Detrusor activity during storage: Normal (stable) Detrusor activity during voiding: Normal Flow pattern was normal   EMG activity was Normal   Impression and plan:  Diagnosis: mixed incontinence by hx  notoing seen on study  Treatment plan:  Evaluate with cysto pt does have urgency unresponsive to medical therapy so she might be a candidate for sacral neuromodulation    11/13/2022 When I last saw her she was seldom doing CIC. Ordered BMP (not done) and follow up renal US . She was having  urgency symptoms with a PVR of >218mL. Returns today back on mirabegron  with zero symptoms and a zero PVR. Denies any leakage. No fevers, chills, nausea, vomiting, cp, sob, headache, vision changes, or other complaint.   11/13/2022 Cr 0.79  RENAL US :  US  RENAL BILATERAL COMPLETE, 11/13/2022 8:12 AM   INDICATION: Retention of urine, unspecified \ R33.9 Retention of urine, unspecified  COMPARISON: Renal ultrasound from 03/05/2012.   TECHNIQUE: Multi-planar, real-time ultrasonography of the retroperitoneum and urinary tract using grayscale imaging was performed, supplemented by color and/or power Doppler as needed.   FINDINGS: .  Right Kidney: Length = 10.4 cm. Normal contour and echogenicity. No hydronephrosis or perinephric fluid. No focal mass is identified.  .  Left Kidney: Length = 9.0 cm. Normal contour and echogenicity. No hydronephrosis or perinephric fluid. No focal mass is identified.  .  Bladder: Unremarkable for the degree of distention. .  Additional comments: Increased echogenicity of the hepatic parenchyma.   IMPRESSION: 1.  No evidence of hydronephrosis. 2.  Increased echogenicity of the hepatic parenchyma, as can be seen with hepatic steatosis.   Electronically Signed By Resident/Fellow: Epifanio Glade Solders, MD on 11/13/22 10:04 AM  05/22/2023: Doing well. Needs self cath less. Still some urgency at times.  Please see dictation for additional details.   Current Outpatient Medications:  .  albuterol  HFA (PROVENTIL  HFA;VENTOLIN  HFA;PROAIR  HFA) 90 mcg/actuation inhaler, Inhale 2 puffs every 6 (six) hours as needed., Disp: , Rfl:  .  Amitiza  24 mcg capsule, TAKE 1 CAPSULE BY MOUTH TWICE A DAY (SWALLOW WHOLE), Disp: , Rfl:  .  amLODIPine  (NORVASC ) 5 mg tablet, Take 5 mg by mouth Once Daily.,  Disp: , Rfl:  .  atenoloL  (TENORMIN ) 25 mg tablet, Take 25 mg by mouth., Disp: , Rfl:  .  busPIRone  (BUSPAR ) 5 mg tablet, Take 5 mg by mouth., Disp: , Rfl:  .  cholecalciferol (VITAMIN  D3) 10 mcg (400 unit) tablet, Take  by mouth., Disp: , Rfl:  .  hydrOXYzine  (VISTARIL ) 25 mg capsule, Take 25 mg by mouth 2 (two) times a day. Indications: anxious, Disp: 60 capsule, Rfl: 11 .  ipratropium-albuteroL  (DUO-NEB) 0.5-2.5 mg/3 mL nebulizer solution, 3 mL., Disp: , Rfl:  .  lactobacillus (Culturelle), Take 1 capsule by mouth Once Daily., Disp: , Rfl:  .  latanoprost  (XALATAN ) 0.005 % ophthalmic solution, Administer 1 drop into affected eye(s)., Disp: , Rfl:  .  linaCLOtide  (Linzess ) 72 mcg cap capsule, Take 72 mcg by mouth., Disp: , Rfl:  .  lisinopriL  (PRINIVIL ) 5 mg tablet, Take 5 mg by mouth nightly., Disp: , Rfl:  .  magnesium  oxide 500 mg cap, TAKE 1 CAPSULE (500 MG TOTAL) BY MOUTH 2 (TWO) TIMES DAILY AT 8 AM AND 10 PM. (Patient not taking: Reported on 10/23/2022), Disp: , Rfl:  .  mirabegron  (MYRBETRIQ ) 50 mg Tb24, Take one tablet (50 mg total) by mouth daily., Disp: 90 tablet, Rfl: 3 .  montelukast  (SINGULAIR ) 10 mg tablet, Take 10 mg by mouth., Disp: , Rfl:  .  omeprazole  (PriLOSEC) 40 mg DR capsule, 1 CAPSULE BY MOUTH DAILY, Disp: , Rfl:  .  ondansetron  (ZOFRAN ) 4 mg tablet, TAKE 1 TABLET BY MOUTH EVERY 8 HOURS AS NEEDED FOR NAUSEA AND VOMITING, Disp: , Rfl:  .  paliperidone  (Invega ) 3 mg 24 hr tablet, Take 3 mg by mouth nightly. Indications: schizophrenia with mood changes, Disp: , Rfl:  .  polyethylene glycol (GLYCOLAX) 17 gram packet, Take 17 g by mouth Once Daily., Disp: , Rfl:  .  prazosin (MINIPRESS) 1 mg capsule, Take 1 mg by mouth., Disp: , Rfl:  .  sertraline  (ZOLOFT ) 100 mg tablet, Take 200 mg by mouth., Disp: , Rfl:  .  traMADoL  (ULTRAM ) 50 mg tablet, Take 50 mg by mouth daily as needed., Disp: , Rfl:  .  traZODone (DESYREL) 50 mg tablet, 1 TABLET BY MOUTH AT BEDTIME, Disp: , Rfl:    Allergies  Allergen Reactions  . Codeine Mental Status Changes  . Cortisone Swelling  . Latex Swelling  . Penicillinase Hives  . Sulfamethoxazole GI Intolerance  . Penicillins  Other (See Comments)    Past Medical History:  Diagnosis Date  . Anemia 05/25/2011  . Arthritis    Weakly positive ANA/DsDNA  . Asthma (CMD) 05/25/2011  . Atypical chest pain 05/25/2011  . Benign hypertension 05/25/2011  . Gastritis 05/25/2011  . Hepatitis C 05/25/2011  . History of thyroid disease 05/25/2011  . Lymphoma    (CMD)   . Osteoarthrosis involving multiple sites 10/17/2010  . PONV (postoperative nausea and vomiting)   . Stress incontinence 02/10/2011  . Urge incontinence 02/10/2011     Past Surgical History:  Procedure Laterality Date  . BARTHOLIN GLAND CYST EXCISION     Procedure: BARTHOLIN GLAND CYST EXCISION  . BLADDER SURGERY  2003   Procedure: BLADDER SURGERY; sling  . BOTOX THERAPY     Procedure: BOTOX INJECTION  . CYSTOSTOMY W/ BLADDER BIOPSY  05/29/2011   Procedure: BLADDER BIOPSY;  Surgeon: Rhae Loges, MD;  Location: [facility] MAIN OR;  Service: Urology;;  . FINGER SURGERY     Procedure: FINGER SURGERY  . HYSTERECTOMY  Procedure: HYSTERECTOMY     Family History  Problem Relation Name Age of Onset  . Autoimmune disease Mother    . Autoimmune disease Maternal Aunt           Vitals:   05/22/23 1406  BP: 129/65  Pulse: 96  SpO2: 99%    There is no height or weight on file to calculate BMI.  ROS  See Assessment and HPI.  Genitourinary:  As per HPI.  Physical Exam  Physical Exam Constitutional:      General: She is not in acute distress.    Appearance: She is not ill-appearing, toxic-appearing or diaphoretic.  Pulmonary:     Effort: Pulmonary effort is normal. No respiratory distress.  Skin:    General: Skin is warm and dry.  Neurological:     Mental Status: She is alert and oriented to person, place, and time. Mental status is at baseline.      Assesment :     Discussed the following plan:  1. Urge incontinence  mirabegron  (MYRBETRIQ ) 50 mg Tb24   Basic Metabolic Panel      Advised the following  PVR dependently  visualized.  0 today.  She still has urgency.  She needs refill her Myrbetriq .  She is not need to catheterize very often.  Will check a renal function today.  If stable hold off on repeat ultrasound.  If worsening will have her resume some CIC and follow her up in a few months with a repeat metabolic panel ultrasound.  If normal despite on seeing her in 6 months with a PVR and another BMP.  No big plans for the summer.  She has been following the [employer] since the beginning and plan needs to continue to do so. Orders Placed This Encounter  Procedures  . Basic Metabolic Panel    Standing Status:   Future    Number of Occurrences:   1    Expected Date:   05/22/2023    Expiration Date:   05/21/2024    Release to patient::   Immediate    There are no Patient Instructions on file for this visit.  Return for follow up 6 months with PVR and BMP; US  if Cr worsening.   Electronically signed by: Seena Maranda Mail III, PA-C

## 2023-05-29 NOTE — Progress Notes (Signed)
 PROVIDER NOTE: Interpretation of information contained herein should be left to medically-trained personnel. Specific patient instructions are provided elsewhere under "Patient Instructions" section of medical record. This document was created in part using AI and STT-dictation technology, any transcriptional errors that may result from this process are unintentional.  Patient: Rachael West  Service: E/M   PCP: Rachael Bearded, PA  DOB: 07-27-1955  DOS: 05/30/2023  Provider: Candi Chafe, MD  MRN: [id number]  Delivery: Face-to-face  Specialty: Interventional Pain Management  Type: Established Patient  Setting: Ambulatory outpatient facility  Specialty designation: 09  Referring Prov.: Rachael Bearded, PA  Location: Outpatient office facility       HPI  [name], a 68 y.o. year old female, is here today because of her Chronic bilateral low back pain without sciatica [M54.50, G89.29]. Rachael West primary complain today is Back Pain (Mid to lower back)  Pertinent problems: Rachael West has Chronic low back pain (1ry area of Pain) (Bilateral) w/ sciatica (Left); Chronic lower extremity pain (2ry area of Pain) (Left); Chronic upper back pain (3ry area of Pain) (Bilateral) (L>R); Chronic neck pain (4th area of Pain) (Bilateral) (L>R); Occipital headache; Chronic knee pain (Bilateral) (R>L); Chronic pain syndrome; Chronic sacroiliac joint pain; DDD (degenerative disc disease), lumbar; Lumbar facet arthropathy; Lumbar facet syndrome (Bilateral) (L>R); Spondylosis without myelopathy or radiculopathy, lumbar region; DDD (degenerative disc disease), thoracic; Thoracolumbar dextroscoliosis; Cervicogenic headache; Tendinitis of rotator cuff (Left); Tendinitis of rotator cuff (Right); Fibromyalgia; Chronic shoulder pain (Bilateral) (R>L); Chronic hip pain (Bilateral) (L>R); Cervicalgia; Chronic idiopathic gout; DDD (degenerative disc disease), cervical; 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); Cervical facet syndrome (Bilateral) (R>L); Spondylosis without myelopathy or radiculopathy, cervical region; Chronic musculoskeletal pain; Chronic low back pain (1ry area of Pain) (Bilateral) (L>R) w/o sciatica; Cervical disc disorder at C5-C6 level with radiculopathy; Cervical radiculopathy at C6; Osteoarthrosis of multiple sites; Radicular pain of upper extremity (Right); Chronic upper extremity pain (Right); Chronic shoulder pain (Right); Arthralgia of shoulder region (Right); Shoulder enthesopathy (Right); Musculoskeletal disorder involving upper trapezius muscle (Right); Abnormal MRI, cervical spine (11/12/2019); Chronic neuropathic pain; Disturbance of skin sensation; Hyperalgesia; Trigger finger, middle finger (Right); Trigger point of shoulder region (Right); Carpal tunnel syndrome (Bilateral); Chronic shoulder pain (Left); Osteoarthritis of glenohumeral joint (Right); Osteoarthritis of shoulder (Right); Rotator cuff arthropathy of shoulder (Right); Tendinopathy of rotator cuff (Right); Lumbosacral sensory radiculopathy at L5 (Left); Abnormal MRI, lumbar spine (05/17/2022); Lumbar facet joint pain; Low back pain of over 3 months duration; Intermittent low back pain; Mechanical low back pain; and Multifactorial low back pain on their pertinent problem list. Pain Assessment: Severity of Chronic pain is reported as a 4 /10. Location: Back Mid, Lower/denies. Onset: More than a month ago. Quality: Dull, Aching. Timing: Constant. Modifying factor(s): sitting down, meds. Vitals:  height is 5\' 5"  (1.651 m) and weight is 201 lb (91.2 kg). Her temperature is 97 F (36.1 C) (abnormal). Her blood pressure is 121/74 and her pulse is 76. Her oxygen  saturation is 100%.  BMI: Estimated body mass index is 33.45 kg/m as calculated from the following:   Height as of this encounter: 5\' 5"  (1.651 m).   Weight as of this encounter: 201 lb (91.2 kg). Last encounter: 05/09/2023. Last  procedure: 05/15/2023.  Reason for encounter: post-procedure evaluation and assessment.  Discussed the use of AI scribe software for clinical note transcription with the patient, who gave verbal consent to proceed.  History of Present Illness   Rachael West  West is a 68 year old female who presents for follow-up after Reset injections for back pain.  She underwent Reset injections for her back pain. Post-procedure, she experienced numbness that lasted until the next morning or afternoon, followed by some shooting pain in the middle of her back, which was not at the injection site. The shooting pain lasted only a couple of hours.  She reports significant improvement in her pain, with more than 80-90% relief. Previously, she had pain on both sides of her lower back, but now she experiences pain only on the right side, with the left side feeling 'good' and having 100% relief. No pain, numbness, or weakness in her legs.  Currently, she still experiences some constant pain on the right side but feels 'a lot better' overall.      Post-procedure evaluation   Type: Lumbar Facet, Medial Branch Block(s)   #5  Laterality: Bilateral  Level: L2, L3, L4, L5, and S1 Medial Branch Level(s). Injecting these levels blocks the L3-4, L4-5, and L5-S1 lumbar facet joints.  Imaging: Fluoroscopic guidance Spinal ([id number]) Anesthesia: Local anesthesia (1-2% Lidocaine ) Anxiolysis: IV Versed  2.0 mg Sedation: Moderate Sedation None required. No Fentanyl  administered.         DOS: 05/15/2023 Performed by: Rachael Chafe, MD  Primary Purpose: Diagnostic/Therapeutic Indications: Low back pain severe enough to impact quality of life or function. 1. Chronic low back pain (1ry area of Pain) (Bilateral) (L>R) w/o sciatica   2. Lumbar facet joint pain   3. Lumbar facet syndrome (Bilateral) (L>R)   4. Lumbar facet arthropathy   5. Low back pain of over 3 months duration   6. Mechanical low back pain   7.  Multifactorial low back pain   8. Spondylosis without myelopathy or radiculopathy, lumbar region    NAS-11 Pain score:   Pre-procedure: 6 /10   Post-procedure: 0-No pain/10    Effectiveness:  Initial hour after procedure: 100 %. Subsequent 4-6 hours post-procedure: 100 %. Analgesia past initial 6 hours: 80 %. Ongoing improvement:  Analgesic: The patient indicates having attained 100% relief of the pain for the duration of the local anesthetic which in fact lasted until the next morning.  Currently she indicates having an ongoing 80% improvement on the right side and 100% ongoing relief of the pain in the left lower back. Function: Rachael West reports improvement in function ROM: Rachael West reports improvement in ROM   Pharmacotherapy Assessment  Analgesic: Tramadol  50 mg, 1-2 tabs (50-100 mg) PO q 6 hrs (400 mg/day of tramadol ) MME/day: 40 mg/day.   Monitoring: [place] PMP: PDMP not reviewed this encounter.       Pharmacotherapy: No side-effects or adverse reactions reported. Compliance: No problems identified. Effectiveness: Clinically acceptable.  Rachael Staple, RN  05/30/2023 12:26 PM  Sign when Signing Visit Safety precautions to be maintained throughout the outpatient stay will include: orient to surroundings, keep bed in low position, maintain call bell within reach at all times, provide assistance with transfer out of bed and ambulation.     No results found for: "CBDTHCR" No results found for: "D8THCCBX" No results found for: "D9THCCBX"  UDS:  Summary  Date Value Ref Range Status  07/19/2022 Note  Final    Comment:    ==================================================================== [employer] Select 13 (MW) ==================================================================== Test                             Result  Flag       Units  Drug Present and Declared for Prescription Verification   Tramadol                        18337        EXPECTED    ng/mg creat   O-Desmethyltramadol            12000        EXPECTED   ng/mg creat   N-Desmethyltramadol            5689         EXPECTED   ng/mg creat    Source of tramadol  is a prescription medication. O-desmethyltramadol    and N-desmethyltramadol are expected metabolites of tramadol .  ==================================================================== Test                      Result    Flag   Units      Ref Range   Creatinine              27               mg/dL      >=16 ==================================================================== Declared Medications:  The flagging and interpretation on this report are based on the  following declared medications.  Unexpected results may arise from  inaccuracies in the declared medications.   **Note: The testing scope of this panel includes these medications:   Tramadol  (Ultram )   **Note: The testing scope of this panel does not include the  following reported medications:   Albuterol  (Ventolin  HFA)  Albuterol  (Duoneb)  Atenolol  (Tenormin )  Buspirone  (Buspar )  Dimenhydrinate   Fluticasone  (Wixela Inhub )  Gabapentin  (Neurontin )  Hydroxyzine  (Atarax )  Ipratropium (Duoneb)  Latanoprost  (Xalatan )  Lisinopril  (Zestril )  Lubiprostone  (Amitiza )  Magnesium  (Mag-Ox)  Meloxicam  (Mobic )  Montelukast  (Singulair )  Naloxone  (Narcan )  Omeprazole  (Prilosec)  Ondansetron  (Zofran )  Paliperidone  (Invega )  Polyethylene Glycol (MiraLAX)  Prazosin (Minipress)  Probiotic  Salmeterol (Wixela Inhub )  Sertraline  (Zoloft )  Sucralfate  (Carafate )  Supplement (Fiber)  Trazodone (Desyrel)  Vitamin B ==================================================================== For clinical consultation, please call [phone]. ====================================================================       ROS  Constitutional: Denies any fever or chills Gastrointestinal: No reported hemesis, hematochezia, vomiting, or acute GI distress Musculoskeletal:  Denies any acute onset joint swelling, redness, loss of ROM, or weakness Neurological: No reported episodes of acute onset apraxia, aphasia, dysarthria, agnosia, amnesia, paralysis, loss of coordination, or loss of consciousness  Medication Review  Benefiber, Culturelle, Magnesium  Oxide -Mg Supplement, albuterol , atenolol , b complex vitamins, busPIRone , dimenhyDRINATE , fluticasone -salmeterol, gabapentin , hydrOXYzine , ipratropium-albuterol , latanoprost , linaclotide , lisinopril , meloxicam , mirabegron  ER, montelukast , naloxone , omeprazole , ondansetron , paliperidone , polyethylene glycol, prazosin, sertraline , sucralfate , traMADol , and traZODone  History Review  Allergy: Ms. Busenbark is allergic to latex, penicillins, codeine, cortisone, and sulfa antibiotics. Drug: Ms. Hollick  reports no history of drug use. Alcohol:  reports no history of alcohol use. Tobacco:  reports that she quit smoking about 33 years ago. Her smoking use included cigarettes. She started smoking about 53 years ago. She has a 30 pack-year smoking history. She has been exposed to tobacco smoke. She has never used smokeless tobacco. Social: Ms. Bosshart  reports that she quit smoking about 33 years ago. Her smoking use included cigarettes. She started smoking about 53 years ago. She has a 30 pack-year smoking history. She has been exposed to tobacco smoke. She has never used smokeless tobacco. She reports that she does not drink alcohol and does not  use drugs. 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 Surgical: Ms. Niday  has a past surgical history that includes Appendectomy; Trigger finger release; Esophagogastroduodenoscopy  (01/17/2011); Colonoscopy (01/17/2011); Abdominal hysterectomy; and Breast biopsy (Left, 2015). Family: family history includes Allergies in her mother; Asthma in her mother and sister; Breast cancer in her cousin; Colon cancer in her paternal aunt; Diabetes in her maternal grandfather and maternal grandmother; Heart disease in her brother, father, paternal grandfather, and paternal grandmother; Liver cancer in her paternal uncle; Ovarian cancer in an other family member; Pancreatic cancer in an other family member.  Laboratory Chemistry Profile   Renal Lab Results  Component Value Date   BUN 8 03/05/2023   CREATININE 0.81 03/05/2023   BCR 10 (L) 03/05/2023   GFRAA >60 08/05/2019   GFRNONAA >60 08/12/2020    Hepatic Lab Results  Component Value Date   AST 18 03/05/2023   ALT 12 03/05/2023   ALBUMIN 3.9 03/05/2023   ALKPHOS 118 03/05/2023   HCVAB REACTIVE (A) 01/12/2014   LIPASE 34 10/31/2010    Electrolytes Lab Results  Component Value Date   NA 140 03/05/2023   K 4.6 03/05/2023   CL 100 03/05/2023   CALCIUM  9.2 03/05/2023   MG 2.2 08/29/2017    Bone Lab Results  Component Value Date   VD25OH 16.7 (L) 03/06/2022   25OHVITD1 21 (L) 08/29/2017   25OHVITD2 <1.0 08/29/2017   25OHVITD3 21 08/29/2017    Inflammation (CRP: Acute Phase) (ESR: Chronic Phase) Lab Results  Component Value Date   CRP 12 (H) 08/29/2017   ESRSEDRATE 46 (H) 08/29/2017         Note: Above Lab results reviewed.  Recent Imaging Review  DG PAIN CLINIC C-ARM 1-60 MIN NO REPORT Fluoro was used, but no Radiologist interpretation will be provided.  Please refer to "NOTES" tab for provider progress note. Note: Reviewed         Physical Exam  General appearance: Well nourished, well developed, and well hydrated. In no apparent acute distress Mental status: Alert, oriented x 3 (person, place, & time)       Respiratory: No evidence of acute respiratory distress Eyes: PERLA Vitals: BP 121/74   Pulse 76    Temp (!) 97 F (36.1 C)   Ht 5\' 5"  (1.651 m)   Wt 201 lb (91.2 kg)   SpO2 100%   BMI 33.45 kg/m  BMI: Estimated body mass index is 33.45 kg/m as calculated from the following:   Height as of this encounter: 5\' 5"  (1.651 m).   Weight as of this encounter: 201 lb (91.2 kg). Ideal: Ideal body weight: 57 kg (125 lb 10.6 oz) Adjusted ideal body weight: 70.7 kg (155 lb 12.8 oz)  Assessment   Diagnosis Status  1. Chronic low back pain (1ry area of Pain) (Bilateral) (L>R) w/o sciatica   2. Lumbar facet joint pain   3. Lumbar facet syndrome (Bilateral) (L>R)   4. Postop check    Improved Improved Resolved   Updated Problems: No problems updated.  Plan of Care  Problem-specific:  Assessment and Plan    Chronic pain, right side   Chronic pain on the right side has significantly improved after recent Reset injections, with overall improvement exceeding 80-90% and complete relief on the left side. Some residual  pain persists on the right, but there is no pain, numbness, or weakness in the legs. Current pain levels do not require further intervention. Apply heat to the shoulder for myofascial knot relief. Call if pain worsens or if there are any concerns.      Rachael West has a current medication list which includes the following long-term medication(s): albuterol , atenolol , gabapentin , ipratropium-albuterol , lisinopril , magnesium  oxide -mg supplement, meloxicam , mirabegron  er, montelukast , naloxone , omeprazole , paliperidone , prazosin, sertraline , sucralfate , tramadol , trazodone, benefiber, and wixela inhub .  Pharmacotherapy (Medications Ordered): No orders of the defined types were placed in this encounter.  Orders:  Orders Placed This Encounter  Procedures   Nursing Instructions:    Please complete this patient's postprocedure evaluation.    Scheduling Instructions:     Please complete this patient's postprocedure evaluation.   Follow-up plan:   Return if  symptoms worsen or fail to improve.     Interventional Therapies  Risk Factors  Complex Considerations:   Allergy: LATEX  Hx Hepatitis C (w/ cirrhosis)      Planned  Pending:      Under consideration:   Diagnostic/therapeutic left cervical ESI #2  Therapeutic left caudal ESI #2  Therapeutic right cervical ESI #3  Therapeutic right Racz procedure #1  Diagnostic right suprascapular NB #1  Therapeutic bilateral lumbar facet RFA #1 (starting on left side)  Therapeutic bilateral cervical facet RFA #1 (starting on left side)    Completed:   Diagnostic/therapeutic left L4-5 LESI x1 (06/14/2024) (100/100/90/90)  Therapeutic right glenohumeral/AC  inj. x4 (12/08/2021) (100/100/100/100)  Therapeutic bilateral lumbar facet MBB x5 (05/15/2023) (100/100/80/R:80L:100)  Diagnostic bilateral cervical facet MBB x1 (11/06/2017) (100/100/90)  Diagnostic left caudal ESI x1 (08/14/2019) (100/100/100 x8 days/90-100)  Therapeutic right cervical ESI x3 (04/12/2021) (100/100/80-95/80-95)  Therapeutic left cervical ESI x1 (07/19/2021) (100/100/100/100) (helped neck, shoulders, headache, and arms)    Therapeutic  Palliative (PRN) options:   Therapeutic/palliative glenohumeral/AC  inj.   Therapeutic/palliative lumbar facet MBB   Therapeutic/palliative cervical facet MBB   Therapeutic/palliative caudal ESI   Therapeutic/palliative cervical ESI    Pharmacotherapy:  Nonopioids transfer 10/27/2019: Benefiber, Amitiza , magnesium , Mobic , and Neurontin       Recent Visits Date Type Provider Dept  05/15/23 Procedure visit Renaldo Caroli, MD [facility]  05/09/23 Office Visit Renaldo Caroli, MD [facility]  Showing recent visits within past 90 days and meeting all other requirements Today's Visits Date Type Provider Dept  05/30/23 Office Visit Renaldo Caroli, MD [facility]  Showing today's visits and meeting all other requirements Future Appointments Date Type  Provider Dept  08/15/23 Appointment Renaldo Caroli, MD [facility]  Showing future appointments within next 90 days and meeting all other requirements   I discussed the assessment and treatment plan with the patient. The patient was provided an opportunity to ask questions and all were answered. The patient agreed with the plan and demonstrated an understanding of the instructions.  Patient advised to call back or seek an in-person evaluation if the symptoms or condition worsens.  Duration of encounter: 30 minutes.  Total time on encounter, as per AMA guidelines included both the face-to-face and non-face-to-face time personally spent by the physician and/or other qualified health care professional(s) on the day of the encounter (includes time in activities that require the physician or other qualified health care professional and does not include time in activities normally performed by clinical staff). Physician's time may include the following activities when performed: Preparing to see the patient (e.g.,  pre-charting review of records, searching for previously ordered imaging, lab work, and nerve conduction tests) Review of prior analgesic pharmacotherapies. Reviewing PMP Interpreting ordered tests (e.g., lab work, imaging, nerve conduction tests) Performing post-procedure evaluations, including interpretation of diagnostic procedures Obtaining and/or reviewing separately obtained history Performing a medically appropriate examination and/or evaluation Counseling and educating the patient/family/caregiver Ordering medications, tests, or procedures Referring and communicating with other health care professionals (when not separately reported) Documenting clinical information in the electronic or other health record Independently interpreting results (not separately reported) and communicating results to the patient/ family/caregiver Care coordination (not separately  reported)  Note by: Rachael Chafe, MD (TTS and AI technology used. I apologize for any typographical errors that were not detected and corrected.) Date: 05/30/2023; Time: 12:58 PM

## 2023-05-30 ENCOUNTER — Ambulatory Visit: Attending: Pain Medicine | Admitting: Pain Medicine

## 2023-05-30 ENCOUNTER — Encounter: Payer: Self-pay | Admitting: Pain Medicine

## 2023-05-30 VITALS — BP 121/74 | HR 76 | Temp 97.0°F | Ht 65.0 in | Wt 201.0 lb

## 2023-05-30 DIAGNOSIS — Z09 Encounter for follow-up examination after completed treatment for conditions other than malignant neoplasm: Secondary | ICD-10-CM | POA: Insufficient documentation

## 2023-05-30 DIAGNOSIS — G8929 Other chronic pain: Secondary | ICD-10-CM | POA: Diagnosis present

## 2023-05-30 DIAGNOSIS — M5459 Other low back pain: Secondary | ICD-10-CM | POA: Diagnosis present

## 2023-05-30 DIAGNOSIS — M545 Low back pain, unspecified: Secondary | ICD-10-CM | POA: Insufficient documentation

## 2023-05-30 DIAGNOSIS — M47816 Spondylosis without myelopathy or radiculopathy, lumbar region: Secondary | ICD-10-CM | POA: Diagnosis present

## 2023-05-30 NOTE — Progress Notes (Signed)
 Safety precautions to be maintained throughout the outpatient stay will include: orient to surroundings, keep bed in low position, maintain call bell within reach at all times, provide assistance with transfer out of bed and ambulation.

## 2023-06-09 ENCOUNTER — Other Ambulatory Visit: Payer: Self-pay | Admitting: Family Medicine

## 2023-06-09 DIAGNOSIS — J4520 Mild intermittent asthma, uncomplicated: Secondary | ICD-10-CM

## 2023-06-20 ENCOUNTER — Other Ambulatory Visit: Payer: Self-pay

## 2023-06-20 DIAGNOSIS — Z1231 Encounter for screening mammogram for malignant neoplasm of breast: Secondary | ICD-10-CM

## 2023-07-05 ENCOUNTER — Encounter: Payer: Self-pay | Admitting: Pulmonary Disease

## 2023-07-05 ENCOUNTER — Ambulatory Visit: Admitting: Pulmonary Disease

## 2023-07-05 VITALS — BP 110/72 | HR 90 | Ht 65.0 in | Wt 207.8 lb

## 2023-07-05 DIAGNOSIS — R911 Solitary pulmonary nodule: Secondary | ICD-10-CM | POA: Diagnosis not present

## 2023-07-05 DIAGNOSIS — J4541 Moderate persistent asthma with (acute) exacerbation: Secondary | ICD-10-CM

## 2023-07-05 DIAGNOSIS — Z87891 Personal history of nicotine dependence: Secondary | ICD-10-CM

## 2023-07-05 MED ORDER — FLUTICASONE-SALMETEROL 500-50 MCG/ACT IN AEPB: 1.0000 | INHALATION_SPRAY | Freq: Two times a day (BID) | RESPIRATORY_TRACT | 3 refills | Status: DC

## 2023-07-05 MED ORDER — PREDNISONE 10 MG PO TABS: 40.0000 mg | ORAL_TABLET | Freq: Every day | ORAL | 0 refills | Status: AC

## 2023-07-05 MED ORDER — MONTELUKAST SODIUM 10 MG PO TABS: 10.0000 mg | ORAL_TABLET | Freq: Every day | ORAL | 3 refills | Status: AC

## 2023-07-05 NOTE — Progress Notes (Signed)
 Rachael West    [id number]    [birth year]  Primary Care Physician:Edstrom, Joesph LABOR, PA  Referring Physician: Wallace Joesph LABOR, PA [address] [place],  [place] [address]  Chief complaint: Consult for asthma  HPI: 68 y.o. 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  Discussed the use of AI scribe software for clinical note transcription with the patient, who gave verbal consent to proceed.  History of Present Illness Rachael West is a 68 year old female with asthma who presents with worsening shortness of breath.  She has experienced a tight chest and worsening shortness of breath over the past week.  She reports wheezing and shortness of breath. Her asthma, diagnosed in 1996, has been managed with a nebulizer, which is now malfunctioning, and a Wixela inhaler used once daily. She also uses Albuterol  but is not currently taking Singulair , despite it being on her medication list. Her symptoms have persisted without additional treatments or consultations since she worsened. She last used prednisone  for asthma in 1998.  Her past medical history includes hepatitis C, cirrhosis, portal hypertension, esophageal varices, and a history of lymphoma treated with chemotherapy in 2016.  No current smoking, exposure to pets, mold, or feather bedding.  Pets: No pets Occupation: Used to work in a bank Exposures: No mold, hot tub, Financial controller.  No feather pillows or comforters No h/o chemo/XRT/amiodarone/macrodantin/MTX  No exposure to asbestos, silica or other organic allergens  Smoking history: 30-pack-year smoker.  Quit in 1992 Travel history: No significant travel history Relevant family  history:Her family history includes COPD in her sister and mother, both of whom smoked.  Outpatient Encounter Medications as of 07/05/2023  Medication Sig   albuterol  (VENTOLIN  HFA) 108 (90 Base) MCG/ACT inhaler TAKE 2 PUFFS BY MOUTH EVERY 6 HOURS AS NEEDED FOR WHEEZE OR SHORTNESS OF BREATH   atenolol  (TENORMIN ) 25 MG tablet Take 1 tablet (25 mg total) by mouth daily.   b complex vitamins tablet Take 1 tablet by mouth daily.   busPIRone  (BUSPAR ) 10 MG tablet Take 1 tablet (10 mg total) by mouth 3 (three) times daily.   dimenhyDRINATE  (DRAMAMINE) 50 MG tablet Take 1 tablet (50 mg total) by mouth every 8 (eight) hours as needed.   gabapentin  (NEURONTIN ) 100 MG capsule Take 2 capsules (200 mg total) by mouth at bedtime. Follow written titration schedule.   hydrOXYzine  (ATARAX ) 50 MG tablet TAKE 1 TABLET BY MOUTH EVERYDAY AT BEDTIME   ipratropium-albuterol  (DUONEB) 0.5-2.5 (3) MG/3ML SOLN INHALE 3 MLS VIA NEBULIZER AS DIRECTED   Lactobacillus Rhamnosus, GG, (CULTURELLE) CAPS Take 1 capsule by mouth daily.   latanoprost  (XALATAN ) 0.005 % ophthalmic solution Place 1 drop into both eyes at bedtime.   linaclotide  (LINZESS ) 290 MCG CAPS capsule Take 290 mcg by mouth daily before breakfast.   lisinopril  (ZESTRIL ) 5 MG tablet Take 1 tablet (5 mg total) by mouth daily.   Magnesium  Oxide -Mg Supplement 500 MG CAPS Take 1 capsule (500 mg total) by mouth in the morning and at bedtime.   meloxicam  (MOBIC ) 15 MG tablet Take 1 tablet (15 mg total) by mouth daily.   mirabegron  ER (MYRBETRIQ ) 25 MG TB24 tablet Take 1 tablet (25 mg total) by mouth daily.   montelukast  (SINGULAIR ) 10 MG  tablet TAKE 1 TABLET BY MOUTH EVERY DAY   naloxone  (NARCAN ) nasal spray 4 mg/0.1 mL Place 1 spray into the nose as needed for up to 365 doses (for opioid-induced respiratory depresssion). In case of emergency (overdose), spray once into each nostril. If no response within 3 minutes, repeat application and call 911.   omeprazole   (PRILOSEC) 40 MG capsule TAKE 1 CAPSULE BY MOUTH EVERY DAY   ondansetron  (ZOFRAN ) 4 MG tablet Take 1 tablet (4 mg total) by mouth 2 (two) times daily.   paliperidone  (INVEGA ) 6 MG 24 hr tablet TAKE 1 TABLET (6 MG TOTAL) BY MOUTH EVERY MORNING.   polyethylene glycol (MIRALAX / GLYCOLAX) packet Take 17 g by mouth 2 (two) times daily.   prazosin (MINIPRESS) 1 MG capsule Take 2 mg by mouth at bedtime.   sertraline  (ZOLOFT ) 100 MG tablet Take 200 mg by mouth every morning.   traMADol  (ULTRAM ) 50 MG tablet Take 1 tablet (50 mg total) by mouth every 6 (six) hours as needed for severe pain (pain score 7-10). Each refill must last 30 days.   traZODone (DESYREL) 50 MG tablet 1 TABLET BY MOUTH AT BEDTIME   Wheat Dextrin (BENEFIBER) POWD Take 6 g by mouth 3 (three) times daily before meals. (2 tsp = 6 g)   WIXELA INHUB  250-50 MCG/ACT AEPB Inhale 1 puff into the lungs in the morning and at bedtime.   sucralfate  (CARAFATE ) 1 g tablet Take 1 tablet (1 g total) by mouth 2 (two) times daily.   No facility-administered encounter medications on file as of 07/05/2023.    Allergies as of 07/05/2023 - Review Complete 07/05/2023  Allergen Reaction Noted   Latex Hives 01/17/2011   Penicillins Hives 01/17/2011   Codeine Anxiety 01/17/2011   Cortisone Swelling 02/04/2015   Sulfa antibiotics  01/17/2011    Past Medical History:  Diagnosis Date   Anemia    Anxiety    Arthritis    Asthma    Depression    GERD (gastroesophageal reflux disease)    Hepatitis-C    History of chemotherapy    Hypertension    Insomnia    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   Lymphoma (HCC) 12/2013   aggressive lymphoma   Personal history of non-Hodgkin lymphomas    Vitamin D  deficiency     Past Surgical History:  Procedure Laterality Date   ABDOMINAL HYSTERECTOMY     APPENDECTOMY     BREAST BIOPSY Left 2015   lymphoma   COLONOSCOPY  01/17/2011   Procedure: COLONOSCOPY;  Surgeon: Lynwood LITTIE [name]., MD;  Location: [facility];  Service: Endoscopy;  Laterality: N/A;   ESOPHAGOGASTRODUODENOSCOPY  01/17/2011   Procedure: ESOPHAGOGASTRODUODENOSCOPY (EGD);  Surgeon: Lynwood LITTIE [name]., MD;  Location: [facility];  Service: Endoscopy;  Laterality: N/A;   TRIGGER FINGER RELEASE      Family History  Problem Relation Age of Onset   Asthma Mother    Allergies Mother    Asthma Sister    Heart disease Father    Heart disease Brother    Diabetes Maternal Grandmother    Diabetes Maternal Grandfather    Heart disease Paternal Grandmother    Heart disease Paternal Grandfather    Colon cancer Paternal Aunt    Liver cancer Paternal Uncle    Breast cancer Cousin    Ovarian cancer Other        great aunt (m)   Pancreatic cancer Other        great aunt (m)  Stomach cancer Neg Hx    Rectal cancer Neg Hx    Esophageal cancer Neg Hx     Social History   Socioeconomic History   Marital status: Single    Spouse name: Not on file   Number of children: Not on file   Years of education: Not on file   Highest education level: Not on file  Occupational History   Occupation: Disabled  Tobacco Use   Smoking status: Former    Current packs/day: 0.00    Average packs/day: 1.5 packs/day for 20.0 years (30.0 ttl pk-yrs)    Types: Cigarettes    Start date: 01/09/1970    Quit date: 01/09/1990    Years since quitting: 33.5    Passive exposure: Past   Smokeless tobacco: Never  Vaping Use   Vaping status: Never Used  Substance and Sexual Activity   Alcohol use: No    Alcohol/week: 0.0 standard drinks of alcohol   Drug use: No   Sexual activity: Not Currently  Other Topics Concern   Not on file  Social History Narrative   Not on file   Social Drivers of Health   Financial Resource Strain: Low Risk  (07/11/2022)   Overall Financial Resource Strain (CARDIA)    Difficulty of Paying Living Expenses: Not hard at all  Food Insecurity: Low Risk  (05/22/2023)   Received from [facility]   Hunger Vital Sign     Within the past 12 months, you worried that your food would run out before you got money to buy more: Never true    Within the past 12 months, the food you bought just didn't last and you didn't have money to get more. : Never true  Transportation Needs: No Transportation Needs (05/22/2023)   Received from [facility]    In the past 12 months, has lack of reliable transportation kept you from medical appointments, meetings, work or from getting things needed for daily living? : No  Physical Activity: Insufficiently Active (07/11/2022)   Exercise Vital Sign    Days of Exercise per Week: 3 days    Minutes of Exercise per Session: 30 min  Stress: No Stress Concern Present (07/11/2022)   [employer] of Occupational Health - Occupational Stress Questionnaire    Feeling of Stress : Not at all  Social Connections: Moderately Integrated (07/11/2022)   Social Connection and Isolation Panel    Frequency of Communication with Friends and Family: More than three times a week    Frequency of Social Gatherings with Friends and Family: More than three times a week    Attends Religious Services: More than 4 times per year    Active Member of [employer] or Organizations: Yes    Attends Engineer, structural: More than 4 times per year    Marital Status: Divorced  Intimate Partner Violence: Not At Risk (07/11/2022)   Humiliation, Afraid, Rape, and Kick questionnaire    Fear of Current or Ex-Partner: No    Emotionally Abused: No    Physically Abused: No    Sexually Abused: No    Review of systems: Review of Systems  Constitutional: Negative for fever and chills.  HENT: Negative.   Eyes: Negative for blurred vision.  Respiratory: as per HPI  Cardiovascular: Negative for chest pain and palpitations.  Gastrointestinal: Negative for vomiting, diarrhea, blood per rectum. Genitourinary: Negative for dysuria, urgency, frequency and hematuria.  Musculoskeletal: Negative for myalgias,  back pain and joint pain.  Skin:  Negative for itching and rash.  Neurological: Negative for dizziness, tremors, focal weakness, seizures and loss of consciousness.  Endo/Heme/Allergies: Negative for environmental allergies.  Psychiatric/Behavioral: Negative for depression, suicidal ideas and hallucinations.  All other systems reviewed and are negative.  Physical Exam: Blood pressure 110/72, pulse 90, height 5' 5 (1.651 m), weight 207 lb 12.8 oz (94.3 kg), SpO2 97%. Gen:      No acute distress HEENT:  EOMI, sclera anicteric Neck:     No masses; no thyromegaly Lungs:    Clear to auscultation bilaterally; normal respiratory effort CV:         Regular rate and rhythm; no murmurs Abd:      + bowel sounds; soft, non-tender; no palpable masses, no distension Ext:    No edema; adequate peripheral perfusion Skin:      Warm and dry; no rash Neuro: alert and oriented x 3 Psych: normal mood and affect  Data Reviewed: Imaging: CT chest 07/30/2019-13 mm groundglass nodule in the left lower lobe which is minimally progressive compared to prior.  Stable platelike atelectasis, scarring the right middle lobe, lingula.  I reviewed the images personally.  PFTs: 11/20/2014 FEV1 1.78 [81%], FVC 2.13, F/F84 Mild restrictive physiology  Labs: CBC 03/05/2023-WBC 5.9, eos 2%, absolute eosinophil count 118  Assessment & Plan Asthma Chronic asthma with recent exacerbation characterized by chest tightness and dyspnea, especially during conversation. Suboptimal medication adherence with Wixela and Singulair . Non-functional nebulizer hindering Duoneb use. Wheezing reported but not auscultated. Last prednisone  use in 1998. - Increase Wixela to twice daily, one puff in the morning and one at bedtime. - Restart Singulair  (montelukast ). - Order new nebulizer machine and supply of Duoneb. - Prescribe prednisone  for 5 days to regain control of symptoms. - Schedule lung function test.  Lung nodule Groundglass  lung nodule on left side noted on CT in 2021. Given smoking history and history of lymphoma, follow-up imaging is warranted. Previous atelectasis findings noted as benign. - Order chest CT to evaluate lung nodule and follow up on previous findings of atelectasis.  Cirrhosis and hepatitis C Lymphoma Lymphoma treated with chemotherapy in 2016. No active management discussed during this visit.    Recommendations: CT chest, PFTs Increase Wixela dose, restart Singulair , nebulizer machine, DuoNebs Prednisone  for 5 days  Lonna Coder MD [facility] 07/05/2023, 9:31 AM  CC: Wallace Joesph LABOR, PA

## 2023-07-05 NOTE — Patient Instructions (Signed)
 VISIT SUMMARY:  Today, you were seen for worsening shortness of breath related to your asthma. We discussed your current symptoms, reviewed your medication regimen, and made some adjustments to help manage your condition better. We also addressed the need for follow-up imaging for a lung nodule previously noted on a CT scan.  YOUR PLAN:  -ASTHMA: Asthma is a condition where your airways narrow and swell, making it difficult to breathe. To manage your recent worsening symptoms, we are increasing your Wixela inhaler to twice daily (one puff in the morning and one at bedtime), restarting Singulair  (montelukast ), and ordering a new nebulizer machine with a supply of Duoneb. Additionally, you will take prednisone  for 5 days to help regain control of your symptoms. A lung function test will also be scheduled to assess your breathing.  -LUNG NODULE: A lung nodule is a small growth in the lung that can be benign or malignant. Given your smoking history and past lymphoma, we need to follow up on this with a chest CT to evaluate the nodule and previous findings of atelectasis (partial lung collapse).  INSTRUCTIONS:  Please follow the new medication regimen as discussed: increase Wixela to twice daily, restart Singulair , and take prednisone  for 5 days. We will order a new nebulizer machine and a supply of Duoneb for you. Additionally, a lung function test and a chest CT scan will be scheduled. Please ensure you attend these appointments for further evaluation.

## 2023-07-09 ENCOUNTER — Telehealth: Payer: Self-pay | Admitting: Pulmonary Disease

## 2023-07-09 NOTE — Telephone Encounter (Signed)
Will await Dr. Matilde Bash response.

## 2023-07-09 NOTE — Telephone Encounter (Signed)
 Per Terri at [facility]- I am trying to push back this order through [facility] as all medication orders have to be sent in electronically. The duoneb is on the order but there is no mention of how often the provider wants the patient to take it. Please advise so we can proceed with this order. Thank you. I have contacted the patient to update her.

## 2023-07-09 NOTE — Telephone Encounter (Signed)
 Duonebs need to be given every 6 hrs as needed for shortness of breath and wheenxing

## 2023-07-09 NOTE — Telephone Encounter (Signed)
 Noted

## 2023-07-10 ENCOUNTER — Ambulatory Visit
Admission: RE | Admit: 2023-07-10 | Discharge: 2023-07-10 | Disposition: A | Discharge status: Home / Self Care | Source: Ambulatory Visit

## 2023-07-10 DIAGNOSIS — Z1231 Encounter for screening mammogram for malignant neoplasm of breast: Secondary | ICD-10-CM

## 2023-07-12 ENCOUNTER — Ambulatory Visit

## 2023-07-12 ENCOUNTER — Ambulatory Visit: Payer: Self-pay

## 2023-07-12 VITALS — BP 129/74 | HR 83 | Ht 65.0 in | Wt 207.2 lb

## 2023-07-12 DIAGNOSIS — F322 Major depressive disorder, single episode, severe without psychotic features: Secondary | ICD-10-CM

## 2023-07-12 DIAGNOSIS — F419 Anxiety disorder, unspecified: Secondary | ICD-10-CM

## 2023-07-12 DIAGNOSIS — J4520 Mild intermittent asthma, uncomplicated: Secondary | ICD-10-CM | POA: Diagnosis not present

## 2023-07-12 DIAGNOSIS — E785 Hyperlipidemia, unspecified: Secondary | ICD-10-CM

## 2023-07-12 DIAGNOSIS — I10 Essential (primary) hypertension: Secondary | ICD-10-CM | POA: Diagnosis not present

## 2023-07-12 MED ORDER — HYDROXYZINE HCL 50 MG PO TABS: 50.0000 mg | ORAL_TABLET | Freq: Every evening | ORAL | 0 refills | Status: DC | PRN

## 2023-07-12 NOTE — Patient Instructions (Signed)
 It was nice to see you today!  As we discussed in clinic:  -Your blood pressure looked great today in the office. Continue your atenolol  and lisinopril  daily as prescribed.  -Continue implementing your diet changes by continuing to eat more fruits/vegetables and increasing your fiber intake through foods like oats! Continue to exercise/walk as tolerated.  -Continue regular follow up with psychiatry.  -Start the prednisone  prescribed by pulmonology today or tomorrow for your asthma, continue your daily inhalers.   I will plan to see you back in 6 months for a follow up with labs the week before!   If you have any problems before your next visit feel free to message me via MyChart (minor issues or questions) or call the office, otherwise you may reach out to schedule an office visit.  Thank you! Saddie Sacks, PA-C

## 2023-07-12 NOTE — Assessment & Plan Note (Signed)
 GAD-7 score = 12. Continue regular follow up with [facility] Psychiatry as scheduled. Continue buspirone  10 mg TID, Invega  6 mg daily, Zoloft  200 mg daily, and hydroxyzine  50 mg as bedtime/as needed for breakthrough anxiety.

## 2023-07-12 NOTE — Progress Notes (Signed)
 Established Patient Office Visit  Subjective   Patient ID: Rachael West, female    DOB: 1955-11-12  Age: 68 y.o. MRN: [id number]  Chief Complaint  Patient presents with   Hypertension   Hyperlipidemia   Asthma   Mood    HPI  Rachael West is a 68 y.o. y/o female who presents to the clinic today for follow up on HTN, HLD, asthma, mood.   HTN: Patient currently taking atenolol , lisinopril  for control of their blood pressure. Reports excellence compliance with this medication. Denies side effects or episodes of hypotension. Checks BP at home periodically. Denies CP, SOB, Palpitations, vision changes, HA, or edema.  HLD: Patient not currently taking any medications for HLD. Controlling currently with diet. Reports that she has stopped eating eggs in the morning, and is eating more fiber (oats) and fruits/vegetables. She walks more as tolerated.  Asthma: Patient reports her asthma symptoms are stable. She takes singulair  daily, as well as Wixela inhaler + albuterol  inhaler/nebulizer as needed. Reports that she just had a follow up with pulmonology on 6/26 who prescribed her a 5 day course of prednisone , but she has not started taking it yet.   Mood: Patient reports that she feels mood/anxiety is well controlled. Follows with [facility] Psychiatry. Reports that she is currently taking Invega , Zoloft  200 mg, buspar  10 mg, hydroxyzine  as needed, prazosin for nightmares/sleep walking, and trazodone for sleep. Reports that she is sleeping well. Denies SI/HI.    ROS Per HPI.    Objective:     BP 129/74   Pulse 83   Ht 5' 5 (1.651 m)   Wt 207 lb 4 oz (94 kg)   SpO2 97%   BMI 34.49 kg/m    Physical Exam Constitutional:      General: She is not in acute distress.    Appearance: Normal appearance.  Cardiovascular:     Rate and Rhythm: Normal rate and regular rhythm.     Heart sounds: Normal heart sounds. No murmur heard.    No friction rub. No gallop.  Pulmonary:      Effort: Pulmonary effort is normal. No respiratory distress.     Breath sounds: Normal breath sounds.  Musculoskeletal:        General: No swelling.  Skin:    General: Skin is warm and dry.  Neurological:     General: No focal deficit present.     Mental Status: She is alert.  Psychiatric:        Mood and Affect: Mood normal.        Behavior: Behavior normal.        Thought Content: Thought content normal.     No results found for any visits on 07/12/23.  Last metabolic panel Lab Results  Component Value Date   GLUCOSE 88 03/05/2023   NA 140 03/05/2023   K 4.6 03/05/2023   CL 100 03/05/2023   CO2 23 03/05/2023   BUN 8 03/05/2023   CREATININE 0.81 03/05/2023   EGFR 79 03/05/2023   CALCIUM  9.2 03/05/2023   PROT 6.9 03/05/2023   ALBUMIN 3.9 03/05/2023   LABGLOB 3.0 03/05/2023   AGRATIO 1.6 03/06/2022   BILITOT 0.3 03/05/2023   ALKPHOS 118 03/05/2023   AST 18 03/05/2023   ALT 12 03/05/2023   ANIONGAP 9 08/12/2020   Last lipids Lab Results  Component Value Date   CHOL 194 03/05/2023   HDL 50 03/05/2023   LDLCALC 126 (H) 03/05/2023   LDLDIRECT 122 (H)  03/25/2020   TRIG 102 03/05/2023   CHOLHDL 3.9 03/05/2023      The 10-year ASCVD risk score (Arnett DK, et al., 2019) is: 11.2%    Assessment & Plan:   Moderately severe major depression (HCC) Assessment & Plan: PHQ-9: 15. Continue regular follow up with [facility] Psychiatry. Invega  6 mg daily, Zoloft  200 mg daily. Prazosin 2 mg nightly for nightmares. Will continue to monitor and coordinate care with psychiatry.   Mild intermittent asthma without complication Assessment & Plan: Pt had recent exacerbation, now stabilized. Cotninue Singulair  10 mg daily, Wixela inhaler for maintenance. Albuterol  inhaler as needed and Duoneb nebulizer as needed. Encouraged patient to begin the course of prednisone  prescribed by pulmonology last week to decrease airway inflammation. Continue regular follow up with pulmonology.  Will continue to coordinate care with pulmonology per their recommendations.    Hyperlipidemia, unspecified hyperlipidemia type Assessment & Plan: Last lipid panel: LDL 126, HDL 50, triglycerides 102. The 10-year ASCVD risk score (Arnett DK, et al., 2019) is: 11.2% Patient has continued to make diet changes including cutting out eggs for breakfast, eating more fiber/oats and fruits/vegetables. Patient hesitant to restart statin medication at this time. Discussed that she can continue to implement her diet changes and we will recheck lipids in 6 months. If LDL does not decrease at that time, then we will revisit statin discussion.   Essential hypertension Assessment & Plan: BP goal <130/80. Blood pressure at goal in office today. Continue lisinopril  5 mg daily, atenolol  25 mg daily. Will cont to monitor.   Anxiety Assessment & Plan: GAD-7 score = 12. Continue regular follow up with [facility] Psychiatry as scheduled. Continue buspirone  10 mg TID, Invega  6 mg daily, Zoloft  200 mg daily, and hydroxyzine  50 mg as bedtime/as needed for breakthrough anxiety.    Other orders -     hydrOXYzine  HCl; Take 1 tablet (50 mg total) by mouth at bedtime as needed.  Dispense: 90 tablet; Refill: 0    Return in about 6 months (around 01/12/2024) for HTN, HLD, Mood, Asthma.    Rachael JULIANNA Sacks, PA-C

## 2023-07-12 NOTE — Assessment & Plan Note (Signed)
 BP goal <130/80. Blood pressure at goal in office today. Continue lisinopril  5 mg daily, atenolol  25 mg daily. Will cont to monitor.

## 2023-07-12 NOTE — Assessment & Plan Note (Addendum)
 PHQ-9: 15. Continue regular follow up with [facility] Psychiatry. Invega  6 mg daily, Zoloft  200 mg daily. Prazosin 2 mg nightly for nightmares. Will continue to monitor and coordinate care with psychiatry.

## 2023-07-12 NOTE — Assessment & Plan Note (Signed)
 Last lipid panel: LDL 126, HDL 50, triglycerides 102. The 10-year ASCVD risk score (Arnett DK, et al., 2019) is: 11.2% Patient has continued to make diet changes including cutting out eggs for breakfast, eating more fiber/oats and fruits/vegetables. Patient hesitant to restart statin medication at this time. Discussed that she can continue to implement her diet changes and we will recheck lipids in 6 months. If LDL does not decrease at that time, then we will revisit statin discussion.

## 2023-07-12 NOTE — Assessment & Plan Note (Signed)
 Pt had recent exacerbation, now stabilized. Cotninue Singulair  10 mg daily, Wixela inhaler for maintenance. Albuterol  inhaler as needed and Duoneb nebulizer as needed. Encouraged patient to begin the course of prednisone  prescribed by pulmonology last week to decrease airway inflammation. Continue regular follow up with pulmonology. Will continue to coordinate care with pulmonology per their recommendations.

## 2023-07-17 ENCOUNTER — Other Ambulatory Visit

## 2023-07-19 ENCOUNTER — Ambulatory Visit: Payer: 59

## 2023-07-19 DIAGNOSIS — Z0000 Encounter for general adult medical examination without abnormal findings: Secondary | ICD-10-CM

## 2023-07-19 NOTE — Patient Instructions (Signed)
 Ms. Yglesias , Thank you for taking time out of your busy schedule to complete your Annual Wellness Visit with me. I enjoyed our conversation and look forward to speaking with you again next year. I, as well as your care team,  appreciate your ongoing commitment to your health goals. Please review the following plan we discussed and let me know if I can assist you in the future. Your Game plan/ To Do List    Referrals: If you haven't heard from the office you've been referred to, please reach out to them at the phone provided.  N/a Follow up Visits: Next Medicare AWV with our clinical staff: 08/28/2024 at 10:10   Have you seen your provider in the last 6 months (3 months if uncontrolled diabetes)? Yes Next Office Visit with your provider: 01/14/2024 at 8:30  Clinician Recommendations:  Aim for 30 minutes of exercise or brisk walking, 6-8 glasses of water, and 5 servings of fruits and vegetables each day.       This is a list of the screening recommended for you and due dates:  Health Maintenance  Topic Date Due   Zoster (Shingles) Vaccine (1 of 2) Never done   Hepatitis B Vaccine (1 of 3 - Risk 3-dose series) Never done   Pneumococcal Vaccine for age over 38 (2 of 2 - PCV) 03/08/2016   COVID-19 Vaccine (3 - Pfizer risk series) 11/17/2021   DTaP/Tdap/Td vaccine (1 - Tdap) 11/06/2023*   Flu Shot  08/10/2023   Medicare Annual Wellness Visit  07/18/2024   Mammogram  07/09/2025   Colon Cancer Screening  06/16/2031   DEXA scan (bone density measurement)  Completed   Hepatitis C Screening  Completed   HPV Vaccine  Aged Out   Meningitis B Vaccine  Aged Out  *Topic was postponed. The date shown is not the original due date.    Advanced directives: (ACP Link)Information on Advanced Care Planning can be found at [place]  Secretary of [facility] Advance Health Care Directives Advance Health Care Directives. [email or web address]  Advance Care Planning is important because it:  [x]  Makes sure you  receive the medical care that is consistent with your values, goals, and preferences  [x]  It provides guidance to your family and loved ones and reduces their decisional burden about whether or not they are making the right decisions based on your wishes.  Follow the link provided in your after visit summary or read over the paperwork we have mailed to you to help you started getting your Advance Directives in place. If you need assistance in completing these, please reach out to us  so that we can help you!  See attachments for Preventive Care and Fall Prevention Tips.

## 2023-07-19 NOTE — Progress Notes (Signed)
 Subjective:   Rachael West is a 68 y.o. who presents for a Medicare Wellness preventive visit.  As a reminder, Annual Wellness Visits don't include a physical exam, and some assessments may be limited, especially if this visit is performed virtually. We may recommend an in-person follow-up visit with your provider if needed.  Visit Complete: Virtual I connected with  Rachael West on 07/19/23 by a audio enabled telemedicine application and verified that I am speaking with the correct person using two identifiers.  Patient Location: Home  Provider Location: Home Office  I discussed the limitations of evaluation and management by telemedicine. The patient expressed understanding and agreed to proceed.  Vital Signs: Because this visit was a virtual/telehealth visit, some criteria may be missing or patient reported. Any vitals not documented were not able to be obtained and vitals that have been documented are patient reported.  VideoError- Librarian, academic were attempted between this provider and patient, however failed, due to patient having technical difficulties OR patient did not have access to video capability.  We continued and completed visit with audio only.   Persons Participating in Visit: Patient.  AWV Questionnaire: No: Patient Medicare AWV questionnaire was not completed prior to this visit.  Cardiac Risk Factors include: advanced age (>75men, >37 women);dyslipidemia;hypertension     Objective:    Today's Vitals   07/19/23 1008  PainSc: 4    There is no height or weight on file to calculate BMI.     07/19/2023   10:17 AM 05/30/2023   12:27 PM 05/15/2023   11:12 AM 05/09/2023    8:32 AM 02/08/2023    1:26 PM 01/23/2023    9:11 AM 01/08/2023    8:22 AM  Advanced Directives  Does Patient Have a Medical Advance Directive? No No No No No No No  Would patient like information on creating a medical advance directive? No -  Patient declined No - Patient declined  No - Patient declined  No - Patient declined No - Patient declined    Current Medications (verified) Outpatient Encounter Medications as of 07/19/2023  Medication Sig   albuterol  (VENTOLIN  HFA) 108 (90 Base) MCG/ACT inhaler TAKE 2 PUFFS BY MOUTH EVERY 6 HOURS AS NEEDED FOR WHEEZE OR SHORTNESS OF BREATH   atenolol  (TENORMIN ) 25 MG tablet Take 1 tablet (25 mg total) by mouth daily.   b complex vitamins tablet Take 1 tablet by mouth daily.   busPIRone  (BUSPAR ) 10 MG tablet Take 1 tablet (10 mg total) by mouth 3 (three) times daily.   dimenhyDRINATE  (DRAMAMINE) 50 MG tablet Take 1 tablet (50 mg total) by mouth every 8 (eight) hours as needed.   fluticasone -salmeterol (WIXELA INHUB ) 500-50 MCG/ACT AEPB Inhale 1 puff into the lungs in the morning and at bedtime.   gabapentin  (NEURONTIN ) 100 MG capsule Take 2 capsules (200 mg total) by mouth at bedtime. Follow written titration schedule.   hydrOXYzine  (ATARAX ) 50 MG tablet Take 1 tablet (50 mg total) by mouth at bedtime as needed.   ipratropium-albuterol  (DUONEB) 0.5-2.5 (3) MG/3ML SOLN INHALE 3 MLS VIA NEBULIZER AS DIRECTED   Lactobacillus Rhamnosus, GG, (CULTURELLE) CAPS Take 1 capsule by mouth daily.   latanoprost  (XALATAN ) 0.005 % ophthalmic solution Place 1 drop into both eyes at bedtime.   linaclotide  (LINZESS ) 290 MCG CAPS capsule Take 290 mcg by mouth daily before breakfast.   lisinopril  (ZESTRIL ) 5 MG tablet Take 1 tablet (5 mg total) by mouth daily.   Magnesium  Oxide -  Mg Supplement 500 MG CAPS Take 1 capsule (500 mg total) by mouth in the morning and at bedtime.   meloxicam  (MOBIC ) 15 MG tablet Take 1 tablet (15 mg total) by mouth daily.   mirabegron  ER (MYRBETRIQ ) 25 MG TB24 tablet Take 1 tablet (25 mg total) by mouth daily.   montelukast  (SINGULAIR ) 10 MG tablet Take 1 tablet (10 mg total) by mouth at bedtime.   naloxone  (NARCAN ) nasal spray 4 mg/0.1 mL Place 1 spray into the nose as needed for up to  365 doses (for opioid-induced respiratory depresssion). In case of emergency (overdose), spray once into each nostril. If no response within 3 minutes, repeat application and call 911.   omeprazole  (PRILOSEC) 40 MG capsule TAKE 1 CAPSULE BY MOUTH EVERY DAY   ondansetron  (ZOFRAN ) 4 MG tablet Take 1 tablet (4 mg total) by mouth 2 (two) times daily.   paliperidone  (INVEGA ) 6 MG 24 hr tablet TAKE 1 TABLET (6 MG TOTAL) BY MOUTH EVERY MORNING.   polyethylene glycol (MIRALAX / GLYCOLAX) packet Take 17 g by mouth 2 (two) times daily.   prazosin (MINIPRESS) 1 MG capsule Take 2 mg by mouth at bedtime.   sertraline  (ZOLOFT ) 100 MG tablet Take 200 mg by mouth every morning.   traMADol  (ULTRAM ) 50 MG tablet Take 1 tablet (50 mg total) by mouth every 6 (six) hours as needed for severe pain (pain score 7-10). Each refill must last 30 days.   traZODone (DESYREL) 50 MG tablet 1 TABLET BY MOUTH AT BEDTIME   Wheat Dextrin (BENEFIBER) POWD Take 6 g by mouth 3 (three) times daily before meals. (2 tsp = 6 g)   No facility-administered encounter medications on file as of 07/19/2023.    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   Lymphoma (HCC) 12/2013   aggressive lymphoma   Personal history of non-Hodgkin lymphomas    Vitamin D  deficiency    Past Surgical History:  Procedure Laterality Date   ABDOMINAL HYSTERECTOMY     APPENDECTOMY     BREAST BIOPSY Left 2015   lymphoma, lymphnode   COLONOSCOPY  01/17/2011   Procedure: COLONOSCOPY;  Surgeon: Lynwood LITTIE [name]., MD;  Location: [facility];  Service: Endoscopy;  Laterality: N/A;   ESOPHAGOGASTRODUODENOSCOPY  01/17/2011   Procedure:  ESOPHAGOGASTRODUODENOSCOPY (EGD);  Surgeon: Lynwood LITTIE [name]., MD;  Location: [facility];  Service: Endoscopy;  Laterality: N/A;   TRIGGER FINGER RELEASE     Family History  Problem Relation Age of Onset   Asthma Mother    Allergies Mother    Heart disease Father    Asthma Sister    Colon cancer Paternal Aunt    Liver cancer Paternal Uncle    Diabetes Maternal Grandmother    Diabetes Maternal Grandfather    Heart disease Paternal Grandmother    Heart disease Paternal Grandfather    Heart disease Brother    Ovarian cancer Other        great aunt (m)   Pancreatic cancer Other        great aunt (m)   Stomach cancer Neg Hx    Rectal cancer Neg  Hx    Esophageal cancer Neg Hx    Social History   Socioeconomic History   Marital status: Single    Spouse name: Not on file   Number of children: Not on file   Years of education: Not on file   Highest education level: Not on file  Occupational History   Occupation: Disabled  Tobacco Use   Smoking status: Former    Current packs/day: 0.00    Average packs/day: 1.5 packs/day for 20.0 years (30.0 ttl pk-yrs)    Types: Cigarettes    Start date: 01/09/1970    Quit date: 01/09/1990    Years since quitting: 33.5    Passive exposure: Past   Smokeless tobacco: Never  Vaping Use   Vaping status: Never Used  Substance and Sexual Activity   Alcohol use: No    Alcohol/week: 0.0 standard drinks of alcohol   Drug use: No   Sexual activity: Not Currently  Other Topics Concern   Not on file  Social History Narrative   Not on file   Social Drivers of Health   Financial Resource Strain: Low Risk  (07/19/2023)   Overall Financial Resource Strain (CARDIA)    Difficulty of Paying Living Expenses: Not hard at all  Food Insecurity: No Food Insecurity (07/19/2023)   Hunger Vital Sign    Worried About Running Out of Food in the Last Year: Never true    Ran Out of Food in the Last Year: Never true  Transportation Needs: No Transportation  Needs (07/19/2023)   PRAPARE - Administrator, Civil Service (Medical): No    Lack of Transportation (Non-Medical): No  Physical Activity: Insufficiently Active (07/19/2023)   Exercise Vital Sign    Days of Exercise per Week: 2 days    Minutes of Exercise per Session: 30 min  Stress: Stress Concern Present (07/19/2023)   [employer] of Occupational Health - Occupational Stress Questionnaire    Feeling of Stress: To some extent  Social Connections: Moderately Isolated (07/19/2023)   Social Connection and Isolation Panel    Frequency of Communication with Friends and Family: More than three times a week    Frequency of Social Gatherings with Friends and Family: More than three times a week    Attends Religious Services: Never    Database administrator or Organizations: Yes    Attends Engineer, structural: More than 4 times per year    Marital Status: Divorced    Tobacco Counseling Counseling given: Not Answered    Clinical Intake:  Pre-visit preparation completed: Yes  Pain : 0-10 Pain Score: 4  Pain Type: Chronic pain Pain Location: Back Pain Orientation: Lower Pain Descriptors / Indicators: Aching Pain Onset: More than a month ago Pain Frequency: Constant     Nutritional Risks: None Diabetes: No  Lab Results  Component Value Date   HGBA1C 5.7 (H) 03/06/2022   HGBA1C 5.9 (H) 01/27/2021   HGBA1C 5.3 03/25/2020     How often do you need to have someone help you when you read instructions, pamphlets, or other written materials from your doctor or pharmacy?: 1 - Never  Interpreter Needed?: No  Information entered by :: NAllen LPN   Activities of Daily Living     07/19/2023   10:09 AM  In your present state of health, do you have any difficulty performing the following activities:  Hearing? 1  Comment wears hearing aids  Vision? 1  Comment eye doctor regularly  Difficulty concentrating  or making decisions? 0  Walking or climbing  stairs? 1  Dressing or bathing? 0  Doing errands, shopping? 0  Preparing Food and eating ? N  Using the Toilet? N  In the past six months, have you accidently leaked urine? Y  Do you have problems with loss of bowel control? N  Managing your Medications? N  Managing your Finances? N  Housekeeping or managing your Housekeeping? N    Patient Care Team: Gayle Saddie JULIANNA DEVONNA as PCP - General (Physician Assistant) Celestia Agent, MD (Inactive) as Consulting Physician (Gastroenterology) Legrand Victory LITTIE MOULD, MD as Consulting Physician (Gastroenterology) Jane Charleston, MD as Consulting Physician (Orthopedic Surgery) Clois Fret, MD as Consulting Physician (Neurosurgery) Tanya Glisson, MD as Consulting Physician (Pain Medicine)  I have updated your Care Teams any recent Medical Services you may have received from other providers in the past year.     Assessment:   This is a routine wellness examination for [place].  Hearing/Vision screen Hearing Screening - Comments:: Has hearing aids that are maintained Vision Screening - Comments:: Regular eye exams, [facility]   Goals Addressed             This Visit's Progress    Patient Stated       07/19/2023, wants to lose weight       Depression Screen     07/19/2023   10:18 AM 07/12/2023    8:23 AM 05/15/2023   11:12 AM 05/09/2023    8:32 AM 03/12/2023    8:40 AM 01/08/2023    8:21 AM 11/06/2022    8:50 AM  PHQ 2/9 Scores  PHQ - 2 Score 2 4 4 4 4  0 4  PHQ- 9 Score 8 15   11  20     Fall Risk     07/19/2023   10:17 AM 07/12/2023    8:23 AM 05/30/2023   12:27 PM 05/15/2023   11:12 AM 05/09/2023    8:32 AM  Fall Risk   Falls in the past year? 1 1 0 1 1  Comment knee goes out, trips over feet      Number falls in past yr: 1 1  1  0  Injury with Fall? 0 0  0 0  Risk for fall due to : Impaired mobility;Medication side effect;History of fall(s);Impaired balance/gait History of fall(s)     Follow up Falls prevention  discussed;Falls evaluation completed Falls evaluation completed       MEDICARE RISK AT HOME:  Medicare Risk at Home Any stairs in or around the home?: No If so, are there any without handrails?: No Home free of loose throw rugs in walkways, pet beds, electrical cords, etc?: Yes Adequate lighting in your home to reduce risk of falls?: Yes Life alert?: No Use of a cane, walker or w/c?: Yes Grab bars in the bathroom?: No Shower chair or bench in shower?: Yes Elevated toilet seat or a handicapped toilet?: No  TIMED UP AND GO:  Was the test performed?  No  Cognitive Function: 6CIT completed        07/19/2023   10:20 AM 07/11/2022    2:26 PM 11/02/2021    1:27 PM 03/25/2020   12:03 PM 05/06/2018   10:17 AM  6CIT Screen  What Year? 0 points 0 points 0 points 0 points 0 points  What month? 0 points 0 points 0 points 0 points 0 points  What time? 3 points 0 points 0 points 0 points 0 points  Count back from 20 0 points 0 points 0 points 0 points 4 points  Months in reverse 2 points 0 points 0 points 0 points 0 points  Repeat phrase 0 points 0 points 0 points  6 points  Total Score 5 points 0 points 0 points  10 points    Immunizations Immunization History  Administered Date(s) Administered   Fluad  Quad(high Dose 65+) 10/20/2021, 11/06/2022   Fluad  Trivalent(High Dose 65+) 11/06/2022   Influenza, High Dose Seasonal PF 11/24/2018, 11/17/2019   Influenza,inj,Quad PF,6+ Mos 11/06/2014   Influenza-Unspecified 10/09/2013   PFIZER(Purple Top)SARS-COV-2 Vaccination 01/29/2019   Pfizer(Comirnaty )Fall Seasonal Vaccine 12 years and older 10/20/2021   Pneumococcal Polysaccharide-23 03/09/2015   Polio, Unspecified 08/14/1957, 08/12/1959   Smallpox 01/13/1954    Screening Tests Health Maintenance  Topic Date Due   Zoster Vaccines- Shingrix  (1 of 2) Never done   Hepatitis B Vaccines (1 of 3 - Risk 3-dose series) Never done   Pneumococcal Vaccine: 50+ Years (2 of 2 - PCV) 03/08/2016    COVID-19 Vaccine (3 - Pfizer risk series) 11/17/2021   DTaP/Tdap/Td (1 - Tdap) 11/06/2023 (Originally 08/10/1974)   INFLUENZA VACCINE  08/10/2023   Medicare Annual Wellness (AWV)  07/18/2024   MAMMOGRAM  07/09/2025   Colonoscopy  06/16/2031   DEXA SCAN  Completed   Hepatitis C Screening  Completed   HPV VACCINES  Aged Out   Meningococcal B Vaccine  Aged Out    Health Maintenance  Health Maintenance Due  Topic Date Due   Zoster Vaccines- Shingrix  (1 of 2) Never done   Hepatitis B Vaccines (1 of 3 - Risk 3-dose series) Never done   Pneumococcal Vaccine: 50+ Years (2 of 2 - PCV) 03/08/2016   COVID-19 Vaccine (3 - Pfizer risk series) 11/17/2021   Health Maintenance Items Addressed: Declines shingrix  and pneumonia vaccines.  Additional Screening:  Vision Screening: Recommended annual ophthalmology exams for early detection of glaucoma and other disorders of the eye. Would you like a referral to an eye doctor? No    Dental Screening: Recommended annual dental exams for proper oral hygiene  Community Resource Referral / Chronic Care Management: CRR required this visit?  No   CCM required this visit?  No   Plan:    I have personally reviewed and noted the following in the patient's chart:   Medical and social history Use of alcohol, tobacco or illicit drugs  Current medications and supplements including opioid prescriptions. Patient is not currently taking opioid prescriptions. Functional ability and status Nutritional status Physical activity Advanced directives List of other physicians Hospitalizations, surgeries, and ER visits in previous 12 months Vitals Screenings to include cognitive, depression, and falls Referrals and appointments  In addition, I have reviewed and discussed with patient certain preventive protocols, quality metrics, and best practice recommendations. A written personalized care plan for preventive services as well as general preventive health  recommendations were provided to patient.   Ardella FORBES Dawn, LPN   2/89/7974   After Visit Summary: (MyChart) Due to this being a telephonic visit, the after visit summary with patients personalized plan was offered to patient via MyChart   Notes: Nothing significant to report at this time.

## 2023-07-20 ENCOUNTER — Ambulatory Visit
Admission: RE | Admit: 2023-07-20 | Discharge: 2023-07-20 | Disposition: A | Discharge status: Home / Self Care | Source: Ambulatory Visit | Attending: Pulmonary Disease | Admitting: Pulmonary Disease

## 2023-07-20 DIAGNOSIS — J4541 Moderate persistent asthma with (acute) exacerbation: Secondary | ICD-10-CM

## 2023-07-20 DIAGNOSIS — R911 Solitary pulmonary nodule: Secondary | ICD-10-CM

## 2023-07-23 ENCOUNTER — Telehealth: Payer: Self-pay

## 2023-07-23 NOTE — Telephone Encounter (Signed)
 Copied from CRM [id number]. Topic: Clinical - Lab/Test Results >> Jul 23, 2023  8:33 AM Celestine FALCON wrote: Reason for CRM: Tiffany from [facility] called to verify the results of the patient's recent CT were in the chart. I did show the results posted as of 07/22/2023. She stated if there were any questions or concerns to please give her a call at [phone]. The patient's phone number is [phone].

## 2023-07-24 ENCOUNTER — Ambulatory Visit: Payer: Self-pay | Admitting: Pulmonary Disease

## 2023-07-24 DIAGNOSIS — R918 Other nonspecific abnormal finding of lung field: Secondary | ICD-10-CM

## 2023-07-24 DIAGNOSIS — R911 Solitary pulmonary nodule: Secondary | ICD-10-CM

## 2023-07-24 HISTORY — DX: Other nonspecific abnormal finding of lung field: R91.8

## 2023-07-31 ENCOUNTER — Other Ambulatory Visit: Payer: Self-pay | Admitting: Family Medicine

## 2023-07-31 DIAGNOSIS — J4520 Mild intermittent asthma, uncomplicated: Secondary | ICD-10-CM

## 2023-08-06 ENCOUNTER — Ambulatory Visit (HOSPITAL_COMMUNITY): Admission: RE | Admit: 2023-08-06 | Source: Ambulatory Visit

## 2023-08-09 ENCOUNTER — Telehealth: Payer: Self-pay

## 2023-08-09 NOTE — Telephone Encounter (Signed)
 Copied from CRM [id number]. Topic: Clinical - Medication Question >> Aug 08, 2023  4:14 PM Rozanna G wrote: Reason for CRM: PT CALLED STATED SHE HAS NOT RECEIVED HER NEBULIZER MACHINE, STATED DR MANNAM ADVISED HER THAT HE WAS GOING TO ORDER ONE ON HER LAST VISIT WHICH \\WAS  IN [place]. PLEASE CONTACT PT WITH THIS INFORMATION. I WAS NOT ABLE LOCATE ANY INFORMATION ON HER NEB   Spoke with patient told her I would reach out as well as if she could to see if they tried to get hold of her.    NFN-

## 2023-08-10 ENCOUNTER — Encounter (HOSPITAL_COMMUNITY)
Admission: RE | Admit: 2023-08-10 | Discharge: 2023-08-10 | Disposition: A | Discharge status: Home / Self Care | Source: Ambulatory Visit | Attending: Pulmonary Disease | Admitting: Pulmonary Disease

## 2023-08-10 DIAGNOSIS — R911 Solitary pulmonary nodule: Secondary | ICD-10-CM | POA: Insufficient documentation

## 2023-08-10 LAB — GLUCOSE, CAPILLARY: Glucose-Capillary: 92 mg/dL (ref 70–99)

## 2023-08-10 MED ORDER — FLUDEOXYGLUCOSE F - 18 (FDG) INJECTION
9.5000 | Freq: Once | INTRAVENOUS | Status: AC
  Administered 2023-08-10: 9.5 via INTRAVENOUS

## 2023-08-15 ENCOUNTER — Encounter: Payer: Self-pay | Admitting: Nurse Practitioner

## 2023-08-15 ENCOUNTER — Ambulatory Visit: Payer: 59 | Attending: Pain Medicine | Admitting: Nurse Practitioner

## 2023-08-15 DIAGNOSIS — Z79891 Long term (current) use of opiate analgesic: Secondary | ICD-10-CM | POA: Diagnosis present

## 2023-08-15 DIAGNOSIS — M5442 Lumbago with sciatica, left side: Secondary | ICD-10-CM | POA: Diagnosis present

## 2023-08-15 DIAGNOSIS — G8929 Other chronic pain: Secondary | ICD-10-CM | POA: Diagnosis present

## 2023-08-15 DIAGNOSIS — Z79899 Other long term (current) drug therapy: Secondary | ICD-10-CM | POA: Insufficient documentation

## 2023-08-15 DIAGNOSIS — M542 Cervicalgia: Secondary | ICD-10-CM | POA: Insufficient documentation

## 2023-08-15 DIAGNOSIS — G894 Chronic pain syndrome: Secondary | ICD-10-CM | POA: Insufficient documentation

## 2023-08-15 DIAGNOSIS — M79605 Pain in left leg: Secondary | ICD-10-CM | POA: Diagnosis present

## 2023-08-15 DIAGNOSIS — M549 Dorsalgia, unspecified: Secondary | ICD-10-CM | POA: Diagnosis present

## 2023-08-15 MED ORDER — TRAMADOL HCL 50 MG PO TABS: 50.0000 mg | ORAL_TABLET | Freq: Four times a day (QID) | ORAL | 5 refills | Status: DC | PRN

## 2023-08-15 NOTE — Progress Notes (Signed)
 PROVIDER NOTE: Interpretation of information contained herein should be left to medically-trained personnel. Specific patient instructions are provided elsewhere under Patient Instructions section of medical record. This document was created in part using AI and STT-dictation technology, any transcriptional errors that may result from this process are unintentional.  Patient: Rachael West  Service: E/M   PCP: Gayle Saddie FALCON, PA-C  DOB: March 11, 1955  DOS: 08/15/2023  Provider: Emmy MARLA Blanch, NP  MRN: [id number]  Delivery: Face-to-face  Specialty: Interventional Pain Management  Type: Established Patient  Setting: Ambulatory outpatient facility  Specialty designation: 09  Referring Prov.: Wallace Joesph LABOR, PA  Location: Outpatient office facility       History of present illness (HPI) Rachael West, a 68 y.o. year old female, is here today because of her No primary diagnosis found.. Rachael West primary complain today is Back Pain (lower)  Pertinent problems: Rachael West has Chronic low back pain (1ry area of Pain) (Bilateral) w/ sciatica (Left); Chronic lower extremity pain (2ry area of Pain) (Left); Chronic upper back pain (3ry area of Pain) (Bilateral) (L>R); Chronic neck pain (4th area of Pain) (Bilateral) (L>R); Occipital headache; Chronic knee pain (Bilateral) (R>L); Chronic pain syndrome; Chronic sacroiliac joint pain; DDD (degenerative disc disease), lumbar; Lumbar facet arthropathy; Lumbar facet syndrome (Bilateral) (L>R); Spondylosis without myelopathy or radiculopathy, lumbar region; DDD (degenerative disc disease), thoracic; and Chronic pain syndrome on their pertinent problem list.   Pain Assessment: Severity of Chronic pain is reported as a 6 /10. Location: Back Lower/radiates down left leg to tops of toes and ball of foot. Onset: More than a month ago. Quality: [name], Shooting. Timing: Constant. Modifying factor(s): meds. Vitals:  height is 5' 5 (1.651 m) and  weight is 203 lb (92.1 kg). Her temperature is 97.2 F (36.2 C) (abnormal). Her blood pressure is 146/71 (abnormal) and her pulse is 95. Her oxygen  saturation is 98%.  BMI: Estimated body mass index is 33.78 kg/m as calculated from the following:   Height as of this encounter: 5' 5 (1.651 m).   Weight as of this encounter: 203 lb (92.1 kg).  Last encounter: 05/30/2023 Last procedure: 05/15/2023  Reason for encounter: medication management.  The patient indicates doing well with current medication regimen.  No adverse reaction or side effects reported to medication.  The patient experience bilateral back pain, with the left side more then right side with radiate down the leg to the outer aspect of the foot.   Pharmacotherapy Assessment   Tramadol  (Ultram ) 50 mg tablet every 6 hours as needed for pain. MME=40 Monitoring: [place] PMP: PDMP reviewed during this encounter.       Pharmacotherapy: No side-effects or adverse reactions reported. Compliance: No problems identified. Effectiveness: Clinically acceptable.  [name] [name] PARAS, RN  08/15/2023  8:22 AM  Sign when Signing Visit Safety precautions to be maintained throughout the outpatient stay will include: orient to surroundings, keep bed in low position, maintain call bell within reach at all times, provide assistance with transfer out of bed and ambulation.   Nursing Pain Medication Assessment:  Safety precautions to be maintained throughout the outpatient stay will include: orient to surroundings, keep bed in low position, maintain call bell within reach at all times, provide assistance with transfer out of bed and ambulation.  Medication Inspection Compliance: Pill count conducted under aseptic conditions, in front of the patient. Neither the pills nor the bottle was removed from the patient's sight at any time. Once count was completed pills were  immediately returned to the patient in their original bottle.  Medication: Tramadol   (Ultram ) Pill/Patch Count: 2 of 120 pills/patches remain Pill/Patch Appearance: Markings consistent with prescribed medication Bottle Appearance: Standard pharmacy container. Clearly labeled. Filled Date: 7 / 7 / 2025 Last Medication intake:  Today    UDS:  Summary  Date Value Ref Range Status  07/19/2022 Note  Final    Comment:    ==================================================================== [employer] [facility] 13 (MW) ==================================================================== Test                             Result       Flag       Units  Drug Present and Declared for Prescription Verification   Tramadol                        18337        EXPECTED   ng/mg creat   O-Desmethyltramadol            12000        EXPECTED   ng/mg creat   N-Desmethyltramadol            5689         EXPECTED   ng/mg creat    Source of tramadol  is a prescription medication. O-desmethyltramadol    and N-desmethyltramadol are expected metabolites of tramadol .  ==================================================================== Test                      Result    Flag   Units      Ref Range   Creatinine              27               mg/dL      >=79 ==================================================================== Declared Medications:  The flagging and interpretation on this report are based on the  following declared medications.  Unexpected results may arise from  inaccuracies in the declared medications.   **Note: The testing scope of this panel includes these medications:   Tramadol  (Ultram )   **Note: The testing scope of this panel does not include the  following reported medications:   Albuterol  (Ventolin  HFA)  Albuterol  (Duoneb)  Atenolol  (Tenormin )  Buspirone  (Buspar )  Dimenhydrinate   Fluticasone  (Wixela Inhub )  Gabapentin  (Neurontin )  Hydroxyzine  (Atarax )  Ipratropium (Duoneb)  Latanoprost  (Xalatan )  Lisinopril  (Zestril )  Lubiprostone  (Amitiza )  Magnesium   (Mag-Ox)  Meloxicam  (Mobic )  Montelukast  (Singulair )  Naloxone  (Narcan )  Omeprazole  (Prilosec)  Ondansetron  (Zofran )  Paliperidone  (Invega )  Polyethylene Glycol (MiraLAX)  Prazosin (Minipress)  Probiotic  Salmeterol (Wixela Inhub )  Sertraline  (Zoloft )  Sucralfate  (Carafate )  Supplement (Fiber)  Trazodone (Desyrel)  Vitamin B ==================================================================== For clinical consultation, please call [phone]. ====================================================================     No results found for: CBDTHCR No results found for: D8THCCBX No results found for: D9THCCBX  ROS  Constitutional: Denies any fever or chills Gastrointestinal: No reported hemesis, hematochezia, vomiting, or acute GI distress Musculoskeletal: Denies any acute onset joint swelling, redness, loss of ROM, or weakness Neurological: No reported episodes of acute onset apraxia, aphasia, dysarthria, agnosia, amnesia, paralysis, loss of coordination, or loss of consciousness  Medication Review  Benefiber, Culturelle, Magnesium  Oxide -Mg Supplement, albuterol , atenolol , b complex vitamins, busPIRone , dimenhyDRINATE , fluticasone -salmeterol, gabapentin , hydrOXYzine , ipratropium-albuterol , latanoprost , linaclotide , lisinopril , meloxicam , mirabegron  ER, montelukast , naloxone , omeprazole , ondansetron , paliperidone , polyethylene glycol, prazosin, sertraline , traMADol , and traZODone  History Review  Allergy: Rachael West is allergic  to latex, penicillins, codeine, cortisone, and sulfa antibiotics. Drug: Rachael West  reports no history of drug use. Alcohol:  reports no history of alcohol use. Tobacco:  reports that she quit smoking about 33 years ago. Her smoking use included cigarettes. She started smoking about 53 years ago. She has a 30 pack-year smoking history. She has been exposed to tobacco smoke. She has never used smokeless tobacco. Social: Ms.  West  reports that she quit smoking about 33 years ago. Her smoking use included cigarettes. She started smoking about 53 years ago. She has a 30 pack-year smoking history. She has been exposed to tobacco smoke. She has never used smokeless tobacco. She reports that she does not drink alcohol and does not use drugs. 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 Surgical: Rachael West  has a past surgical history that includes Appendectomy; Trigger finger release; Esophagogastroduodenoscopy (01/17/2011); Colonoscopy (01/17/2011); Abdominal hysterectomy; and Breast biopsy (Left, 2015). Family: family history includes Allergies in her mother; Asthma in her mother and sister; Colon cancer in her paternal aunt; Diabetes in her maternal grandfather and maternal grandmother; Heart disease in her brother, father, paternal grandfather, and paternal grandmother; Liver cancer in her paternal uncle; Ovarian cancer in an other family member; Pancreatic cancer in an other family member.  Laboratory Chemistry Profile   Renal Lab Results  Component Value Date   BUN 8 03/05/2023   CREATININE 0.81 03/05/2023   BCR 10 (L) 03/05/2023   GFRAA >60 08/05/2019   GFRNONAA >60 08/12/2020    Hepatic Lab Results  Component Value Date   AST 18 03/05/2023   ALT 12 03/05/2023   ALBUMIN 3.9 03/05/2023   ALKPHOS 118 03/05/2023   HCVAB REACTIVE (A) 01/12/2014   LIPASE 34 10/31/2010    Electrolytes Lab Results  Component Value Date   NA 140 03/05/2023   K 4.6 03/05/2023   CL 100 03/05/2023   CALCIUM  9.2 03/05/2023   MG 2.2 08/29/2017    Bone Lab Results  Component Value Date   VD25OH 16.7 (L) 03/06/2022   25OHVITD1 21 (L)  08/29/2017   25OHVITD2 <1.0 08/29/2017   25OHVITD3 21 08/29/2017    Inflammation (CRP: Acute Phase) (ESR: Chronic Phase) Lab Results  Component Value Date   CRP 12 (H) 08/29/2017   ESRSEDRATE 46 (H) 08/29/2017         Note: Above Lab results reviewed.  Recent Imaging Review  CT CHEST WO CONTRAST CLINICAL DATA:  Lung nodule, > 8mm  EXAM: CT CHEST WITHOUT CONTRAST  TECHNIQUE: Multidetector CT imaging of the chest was performed following the standard protocol without IV contrast.  RADIATION DOSE REDUCTION: This exam was performed according to the departmental dose-optimization program which includes automated exposure control, adjustment of the mA and/or kV according to patient size and/or use of iterative reconstruction technique.  COMPARISON:  August 05, 2019, August 05, 2018  FINDINGS: Cardiovascular: Borderline cardiomegaly. Trace pericardial effusion. No aortic aneurysm. Scattered, calcified aortic atherosclerosis.  Mediastinum/Nodes: No mediastinal mass. No mediastinal, hilar, or axillary lymphadenopathy.  Lungs/Pleura: The midline trachea and bronchi are patent. No focal airspace consolidation, pleural effusion, or pneumothorax. Similar fibrolinear scarring and atelectasis in the right middle lobe and lingula. The partially ground-glass and cystic nodule in the central left lower lobe appears larger in the interim, measuring 1.5 x  1.3 x 1.5 cm (APxTRxCC), with increasing surrounding ground-glass and soft tissue nodularity. This may communicate with an adjacent segmental bronchus. There is a new nodular opacity in the dependent aspect of the lingula abutting the fissure, measuring 0.7 x 1.7 cm (axial 73). A couple of small tree-in-bud nodules in the superior segment of the left lower lobe anteriorly (axial 53), are new. There is a new nodule dependently in the right upper lobe measuring 5 mm (axial 40). New 5 mm nodule in the anterior left upper lobe (axial 30).  2 mm micronodule in the dependent right upper lobe (axial 37).  Musculoskeletal: No acute fracture or destructive bone lesion. Osteopenia. Multilevel degenerative disc disease of the spine.  Upper Abdomen: No acute abnormality in the partially visualized upper abdomen.  IMPRESSION: 1. The ground-glass nodule in the left lower lobe now demonstrates more cystic change and has increased in size in the interim, measuring 1.5 x 1.3 x 1.5 cm. There is some new soft tissue nodularity and increasing surrounding ground-glass opacity associated with this nodule, which now may communicate with a an adjacent segmental bronchus. This is worrisome for underlying neoplasm. A follow-up PET-CT should be considered for further characterization. 2. Interval development of a nodular opacity abutting the inferior left fissure in the lingula, measuring 0.7 x 1.7 cm. A few additional scattered small nodules are new within both lungs measuring up to 5 mm. These could be infectious or inflammatory or metastatic in etiology.  These results will be called to the ordering clinician or representative by the Radiologist Assistant and communication documented in the PACS or [employer].  Aortic Atherosclerosis (ICD10-I70.0).  Electronically Signed   By: Rogelia Myers M.D.   On: 07/22/2023 22:01 Note: Reviewed        Physical Exam  Vitals: BP (!) 146/71   Pulse 95   Temp (!) 97.2 F (36.2 C)   Ht 5' 5 (1.651 m)   Wt 203 lb (92.1 kg)   SpO2 98%   BMI 33.78 kg/m  BMI: Estimated body mass index is 33.78 kg/m as calculated from the following:   Height as of this encounter: 5' 5 (1.651 m).   Weight as of this encounter: 203 lb (92.1 kg). Ideal: Ideal body weight: 57 kg (125 lb 10.6 oz) Adjusted ideal body weight: 71 kg (156 lb 9.6 oz) General appearance: Well nourished, well developed, and well hydrated. In no apparent acute distress Mental status: Alert, oriented x 3 (person, place, & time)        Respiratory: No evidence of acute respiratory distress Eyes: PERLA   Assessment   Diagnosis Status  1. 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 Chronic lower extremity pain (2ry area of Pain) (Left)   3. Chronic upper back pain (3ry area of Pain) (Bilateral) (L>R)   4. Chronic neck pain (4th area of Pain) (Bilateral) (L>R)   5. Chronic pain syndrome   6. Pharmacologic therapy   7. Chronic use of opiate for therapeutic purpose   8. Encounter for medication management   9. Encounter for chronic pain management    Controlled Controlled Controlled   Updated Problems: No problems updated.  Plan of Care  Problem-specific:  Assessment and Plan We will continue on current medication regimen.  Prescribing drug monitoring (PDMP) reviewed; findings consistent with the use of prescribed medication and no evidence of narcotic misuse or abuse.  Urine drug screening (UDS) up-to-date.  No other new issues or  concerns reported today's visit.  Schedule follow-up in 6 months for medication manage for medication management.   Rachael West has a current medication list which includes the following long-term medication(s): albuterol , atenolol , fluticasone -salmeterol, gabapentin , ipratropium-albuterol , lisinopril , magnesium  oxide -mg supplement, meloxicam , mirabegron  er, montelukast , naloxone , omeprazole , paliperidone , prazosin, sertraline , trazodone, benefiber, and tramadol .  Pharmacotherapy (Medications Ordered): Meds ordered this encounter  Medications   traMADol  (ULTRAM ) 50 MG tablet    Sig: Take 1 tablet (50 mg total) by mouth every 6 (six) hours as needed for severe pain (pain score 7-10). Each refill must last 30 days.    Dispense:  120 tablet    Refill:  5    DO NOT: delete (not duplicate); no partial-fill (will deny script to complete), no refill request (F/U required). DISPENSE: 1 day early if closed on fill date. WARN: No CNS-depressants  within 8 hrs of med.   Orders:  No orders of the defined types were placed in this encounter.       Return in about 6 months (around 02/15/2024) for (F2F), (MM), Emmy Blanch NP.    Recent Visits Date Type Provider Dept  05/30/23 Office Visit Tanya Glisson, MD [facility]  Showing recent visits within past 90 days and meeting all other requirements Today's Visits Date Type Provider Dept  08/15/23 Office Visit Clarissia Mckeen K, NP [facility]  Showing today's visits and meeting all other requirements Future Appointments No visits were found meeting these conditions. Showing future appointments within next 90 days and meeting all other requirements  I discussed the assessment and treatment plan with the patient. The patient was provided an opportunity to ask questions and all were answered. The patient agreed with the plan and demonstrated an understanding of the instructions.  Patient advised to call back or seek an in-person evaluation if the symptoms or condition worsens.  Duration of encounter: 30 minutes.  Total time on encounter, as per AMA guidelines included both the face-to-face and non-face-to-face time personally spent by the physician and/or other qualified health care professional(s) on the day of the encounter (includes time in activities that require the physician or other qualified health care professional and does not include time in activities normally performed by clinical staff). Physician's time may include the following activities when performed: Preparing to see the patient (e.g., pre-charting review of records, searching for previously ordered imaging, lab work, and nerve conduction tests) Review of prior analgesic pharmacotherapies. Reviewing PMP Interpreting ordered tests (e.g., lab work, imaging, nerve conduction tests) Performing post-procedure evaluations, including interpretation of diagnostic procedures Obtaining and/or reviewing  separately obtained history Performing a medically appropriate examination and/or evaluation Counseling and educating the patient/family/caregiver Ordering medications, tests, or procedures Referring and communicating with other health care professionals (when not separately reported) Documenting clinical information in the electronic or other health record Independently interpreting results (not separately reported) and communicating results to the patient/ family/caregiver Care coordination (not separately reported)  Note by: Primitivo Merkey K Corderro Koloski, NP (TTS and AI technology used. I apologize for any typographical errors that were not detected and corrected.) Date: 08/15/2023; Time: 9:10 AM

## 2023-08-15 NOTE — Progress Notes (Signed)
 Safety precautions to be maintained throughout the outpatient stay will include: orient to surroundings, keep bed in low position, maintain call bell within reach at all times, provide assistance with transfer out of bed and ambulation.   Nursing Pain Medication Assessment:  Safety precautions to be maintained throughout the outpatient stay will include: orient to surroundings, keep bed in low position, maintain call bell within reach at all times, provide assistance with transfer out of bed and ambulation.  Medication Inspection Compliance: Pill count conducted under aseptic conditions, in front of the patient. Neither the pills nor the bottle was removed from the patient's sight at any time. Once count was completed pills were immediately returned to the patient in their original bottle.  Medication: Tramadol  (Ultram ) Pill/Patch Count: 2 of 120 pills/patches remain Pill/Patch Appearance: Markings consistent with prescribed medication Bottle Appearance: Standard pharmacy container. Clearly labeled. Filled Date: 7 / 7 / 2025 Last Medication intake:  Today

## 2023-08-17 ENCOUNTER — Other Ambulatory Visit: Payer: Self-pay | Admitting: Pain Medicine

## 2023-08-17 DIAGNOSIS — Z79899 Other long term (current) drug therapy: Secondary | ICD-10-CM

## 2023-08-17 DIAGNOSIS — Z79891 Long term (current) use of opiate analgesic: Secondary | ICD-10-CM

## 2023-08-17 DIAGNOSIS — G894 Chronic pain syndrome: Secondary | ICD-10-CM

## 2023-08-17 DIAGNOSIS — G8929 Other chronic pain: Secondary | ICD-10-CM

## 2023-08-21 ENCOUNTER — Ambulatory Visit: Payer: Self-pay | Admitting: Pulmonary Disease

## 2023-08-21 ENCOUNTER — Telehealth: Payer: Self-pay

## 2023-08-21 NOTE — Telephone Encounter (Signed)
 CRM signed & faxed

## 2023-09-05 ENCOUNTER — Ambulatory Visit (INDEPENDENT_AMBULATORY_CARE_PROVIDER_SITE_OTHER): Admitting: Acute Care

## 2023-09-05 ENCOUNTER — Encounter: Payer: Self-pay | Admitting: Acute Care

## 2023-09-05 VITALS — BP 131/83 | HR 87 | Temp 98.0°F | Ht 65.0 in | Wt 203.0 lb

## 2023-09-05 DIAGNOSIS — R911 Solitary pulmonary nodule: Secondary | ICD-10-CM | POA: Diagnosis not present

## 2023-09-05 DIAGNOSIS — J4520 Mild intermittent asthma, uncomplicated: Secondary | ICD-10-CM

## 2023-09-05 DIAGNOSIS — Z87891 Personal history of nicotine dependence: Secondary | ICD-10-CM | POA: Diagnosis not present

## 2023-09-05 DIAGNOSIS — R942 Abnormal results of pulmonary function studies: Secondary | ICD-10-CM | POA: Diagnosis not present

## 2023-09-05 MED ORDER — ALBUTEROL SULFATE HFA 108 (90 BASE) MCG/ACT IN AERS: 1.0000 | INHALATION_SPRAY | Freq: Four times a day (QID) | RESPIRATORY_TRACT | 2 refills | Status: DC | PRN

## 2023-09-05 MED ORDER — FLUTICASONE-SALMETEROL 500-50 MCG/ACT IN AEPB: 1.0000 | INHALATION_SPRAY | Freq: Two times a day (BID) | RESPIRATORY_TRACT | 6 refills | Status: AC

## 2023-09-05 NOTE — Patient Instructions (Signed)
 It is great to see you today. We have reviewed your PET scan. The nodule of concern has some very low uptake per PET scan. We discussed moving forward with biopsy versus watchful waiting with a 71-month follow-up CT scan of the chest. You prefer to do the 39-month follow-up of the chest. Scan will be due the beginning of November 2025. You will follow-up with me 1 to 2 weeks after the scan has been done to the we can review the results together. If there has been any change in the size or density of the lung nodules we will consider biopsy at that point in time. I have renewed your Wixela and your albuterol  inhaler. I will see you in 3 months to review your follow-up CT. Please call to be sooner if you develop any unexplained weight loss or blood in your sputum. Please contact office for sooner follow up if symptoms do not improve or worsen or seek emergency care

## 2023-09-05 NOTE — Progress Notes (Signed)
 History of Present Illness Rachael West is a 68 y.o. female former smoker followed by Dr. Theophilus for asthma and referred to Dr. Shelah for evaluation of pulmonary nodules found on Imaging.   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   09/05/2023 Discussed the use of AI scribe software for clinical note transcription with the patient, who gave verbal consent to proceed.  History of Present Illness Rachael West is a 68 year old female with asthma and a lung nodule who presents for follow-up of the lung nodule.She is followed by Dr. Theophilus for asthma.   She has a history of asthma and was found to have a lung nodule during a CT workup for shortness of breath in July 2025. SABRA The nodule was initially identified on a CT scan, which led to a PET scan showing a left lower lung nodule with a low uptake of 2.1. Another nodule in the lingula had an SUV of 1.9, while the right and left upper lobe nodules showed no hypermetabolism.  She has a significant smoking history, having quit in 1992 after a 30 pack-year history. No current issues with her asthma, although she always feels like there's something in her throat. She denies weight loss and hemoptysis.  Her family history includes a cousin and aunt with lupus, and her sister and mother have COPD.  Family history includes colon cancer in a paternal aunt and liver cancer and a paternal uncle, ovarian cancer and pancreatic cancer maternal great aunt.  Regarding her asthma management, she uses Wixela 500/50 and an albuterol  inhaler. She uses the albuterol  inhaler at least twice daily. She has had no adverse reactions to [facility] despite a history of swelling with cortisone injections.  We  discussed moving forward with biopsy of the left lower lobe cystic lung nodule versus watchful waiting with a 40-month follow-up CT scan of the chest.We reviewed the process for the biopsy. You prefer the 3 month follow up, and understand the risk in waiting. We discussed calling to be seen sooner for any blood in sputum or unexplained weight loss, or if you change your mind about biopsy.  I sent in refills for both Wixela and Albuterol  inhalers.      Test Results: PET scan 08/10/2023 The 1.5 cm ground-glass and cystic nodule identified on recent chest CT shows only low level FDG uptake with SUV max = 2.1. As low-grade or well differentiated neoplasm can be poorly FDG avid, continued close follow-up warranted. 2. The new perifissural lingular nodule identified on the recent CT at 1.7 cm long axis is non hypermetabolic with SUV max = 1.9. Close attention on follow-up recommended. 3. 5 mm posterior right upper lobe nodule identified previously shows no hypermetabolism on PET imaging. Attention on follow-up. 4. 5 mm nodule identified anterior left upper lobe previously is not discernible on CT imaging today and may have resolved. There is no hypermetabolism in this region on PET imaging. 5. No evidence for hypermetabolic metastatic disease in the neck, abdomen, or pelvis. 6.  Aortic Atherosclerosis (ICD10-I70.0).  07/20/2023 CT Chest The ground-glass nodule in the left lower lobe now demonstrates more cystic change and has increased in size in the interim, measuring 1.5 x 1.3 x 1.5 cm. There is some new soft tissue nodularity and increasing surrounding ground-glass opacity associated with this nodule, which  now may communicate with a an adjacent segmental bronchus. This is worrisome for underlying neoplasm. A follow-up PET-CT should be considered for further characterization. 2. Interval development of a nodular opacity abutting the inferior left fissure in the lingula, measuring 0.7 x 1.7  cm. A few additional scattered small nodules are new within both lungs measuring up to 5 mm. These could be infectious or inflammatory or metastatic in etiology.      Latest Ref Rng & Units 03/05/2023    8:08 AM 10/30/2022    8:35 AM 07/05/2022   10:10 AM  CBC  WBC 3.4 - 10.8 x10E3/uL 5.9  4.5  5.5   Hemoglobin 11.1 - 15.9 g/dL 87.3  88.9  88.5   Hematocrit 34.0 - 46.6 % 39.3  35.2  36.7   Platelets 150 - 450 x10E3/uL 272  247  229        Latest Ref Rng & Units 03/05/2023    8:08 AM 10/30/2022    8:35 AM 07/05/2022   10:10 AM  BMP  Glucose 70 - 99 mg/dL 88  79  79   BUN 8 - 27 mg/dL 8  6  8    Creatinine 0.57 - 1.00 mg/dL 9.18  9.19  9.10   BUN/Creat Ratio 12 - 28 10  8  9    Sodium 134 - 144 mmol/L 140  138  140   Potassium 3.5 - 5.2 mmol/L 4.6  4.7  4.7   Chloride 96 - 106 mmol/L 100  100  102   CO2 20 - 29 mmol/L 23  25  25    Calcium  8.7 - 10.3 mg/dL 9.2  9.1  9.2     BNP No results found for: BNP  ProBNP No results found for: PROBNP  PFT No results found for: FEV1PRE, FEV1POST, FVCPRE, FVCPOST, TLC, DLCOUNC, PREFEV1FVCRT, PSTFEV1FVCRT  NM PET Image Initial (PI) Skull Base To Thigh Result Date: 08/19/2023 CLINICAL DATA:  Initial treatment strategy for pulmonary nodules. EXAM: NUCLEAR MEDICINE PET SKULL BASE TO THIGH TECHNIQUE: 9.5 mCi F-18 FDG was injected intravenously. Full-ring PET imaging was performed from the skull base to thigh after the radiotracer. CT data was obtained and used for attenuation correction and anatomic localization. Fasting blood glucose: 92 mg/dl COMPARISON:  Chest CT 92/88/7974.  PET-CT 05/29/2014 FINDINGS: Mediastinal blood pool activity: SUV max 2.9 Liver activity: SUV max NA NECK: No hypermetabolic lymph nodes in the neck. Symmetric uptake in the tonsillar region is nonspecific. FDG accumulation in the posterior vocal cords likely reflects movement after radiopharmaceutical injection. Incidental CT findings: None. CHEST: No  hypermetabolic mediastinal or hilar nodes. The 1.5 cm ground-glass and cystic nodule identified on recent chest CT shows only low level FDG uptake with SUV max = 2.1. Similarly, the new perifissural lingular nodule identified on the recent CT at 1.7 cm long axis is non hypermetabolic with SUV max = 1.9. 5 mm posterior right upper lobe nodule identified previously shows no hypermetabolism on PET imaging. 5 mm nodule identified anterior left upper lobe previously is not discernible on CT imaging today and may have resolved. There is no hypermetabolism in this region on PET imaging. Incidental CT findings: Mild atherosclerotic calcification is noted in the wall of the thoracic aorta. ABDOMEN/PELVIS: No abnormal hypermetabolic activity within the liver, pancreas, adrenal glands, or spleen. No hypermetabolic lymph nodes in the abdomen or pelvis. Incidental CT findings: None. SKELETON: No focal hypermetabolic activity to suggest skeletal metastasis. Incidental CT findings: No worrisome lytic or sclerotic osseous abnormality. IMPRESSION:  1. The 1.5 cm ground-glass and cystic nodule identified on recent chest CT shows only low level FDG uptake with SUV max = 2.1. As low-grade or well differentiated neoplasm can be poorly FDG avid, continued close follow-up warranted. 2. The new perifissural lingular nodule identified on the recent CT at 1.7 cm long axis is non hypermetabolic with SUV max = 1.9. Close attention on follow-up recommended. 3. 5 mm posterior right upper lobe nodule identified previously shows no hypermetabolism on PET imaging. Attention on follow-up. 4. 5 mm nodule identified anterior left upper lobe previously is not discernible on CT imaging today and may have resolved. There is no hypermetabolism in this region on PET imaging. 5. No evidence for hypermetabolic metastatic disease in the neck, abdomen, or pelvis. 6.  Aortic Atherosclerosis (ICD10-I70.0). Electronically Signed   By: Camellia Candle M.D.   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 Her smoking use included cigarettes. She started smoking about 53 years ago. She has a 30 pack-year smoking history. She has been exposed to tobacco smoke. She has never used smokeless tobacco. She reports that she does not drink alcohol and does not use drugs.  Tobacco Cessation: Former smoker with a 30 pack year smoking history, quit 1992   Past surgical hx, Family hx, Social hx all reviewed.  Current Outpatient Medications on File Prior to Visit  Medication Sig   albuterol  (VENTOLIN  HFA) 108 (90 Base) MCG/ACT inhaler TAKE 2 PUFFS BY MOUTH EVERY 6 HOURS AS NEEDED FOR WHEEZE OR SHORTNESS OF BREATH   atenolol  (TENORMIN ) 25 MG tablet Take 1 tablet (25 mg total) by mouth daily.   b complex vitamins tablet Take 1 tablet by mouth daily.   busPIRone  (BUSPAR ) 10 MG tablet Take 1 tablet (10 mg total) by mouth 3 (three) times daily.   dimenhyDRINATE  (DRAMAMINE) 50 MG tablet Take 1 tablet (50 mg total) by mouth every 8 (eight) hours as needed.    fluticasone -salmeterol (WIXELA INHUB ) 500-50 MCG/ACT AEPB Inhale 1 puff into the lungs in the morning and at bedtime.   gabapentin  (NEURONTIN ) 100 MG capsule Take 2 capsules (200 mg total) by mouth at bedtime. Follow written titration schedule.   hydrOXYzine  (ATARAX ) 50 MG tablet Take 1 tablet (50 mg total) by mouth at bedtime as needed.   ipratropium-albuterol  (DUONEB) 0.5-2.5 (3) MG/3ML SOLN INHALE 3  MLS VIA NEBULIZER AS DIRECTED   Lactobacillus Rhamnosus, GG, (CULTURELLE) CAPS Take 1 capsule by mouth daily.   latanoprost  (XALATAN ) 0.005 % ophthalmic solution Place 1 drop into both eyes at bedtime.   linaclotide  (LINZESS ) 290 MCG CAPS capsule Take 290 mcg by mouth daily before breakfast.   lisinopril  (ZESTRIL ) 5 MG tablet Take 1 tablet (5 mg total) by mouth daily.   Magnesium  Oxide -Mg Supplement 500 MG CAPS Take 1 capsule (500 mg total) by mouth in the morning and at bedtime.   meloxicam  (MOBIC ) 15 MG tablet Take 1 tablet (15 mg total) by mouth daily.   mirabegron  ER (MYRBETRIQ ) 25 MG TB24 tablet Take 1 tablet (25 mg total) by mouth daily.   montelukast  (SINGULAIR ) 10 MG tablet Take 1 tablet (10 mg total) by mouth at bedtime.   naloxone  (NARCAN ) nasal spray 4 mg/0.1 mL Place 1 spray into the nose as needed for up to 365 doses (for opioid-induced respiratory depresssion). In case of emergency (overdose), spray once into each nostril. If no response within 3 minutes, repeat application and call 911.   omeprazole  (PRILOSEC) 40 MG capsule TAKE 1 CAPSULE BY MOUTH EVERY DAY   ondansetron  (ZOFRAN ) 4 MG tablet Take 1 tablet (4 mg total) by mouth 2 (two) times daily.   paliperidone  (INVEGA ) 6 MG 24 hr tablet TAKE 1 TABLET (6 MG TOTAL) BY MOUTH EVERY MORNING.   polyethylene glycol (MIRALAX / GLYCOLAX) packet Take 17 g by mouth 2 (two) times daily.   prazosin (MINIPRESS) 1 MG capsule Take 2 mg by mouth at bedtime.   sertraline  (ZOLOFT ) 100 MG tablet Take 200 mg by mouth every morning.   traMADol  (ULTRAM )  50 MG tablet Take 1 tablet (50 mg total) by mouth every 6 (six) hours as needed for severe pain (pain score 7-10). Each refill must last 30 days.   traZODone (DESYREL) 50 MG tablet 1 TABLET BY MOUTH AT BEDTIME   Wheat Dextrin (BENEFIBER) POWD Take 6 g by mouth 3 (three) times daily before meals. (2 tsp = 6 g)   No current facility-administered medications on file prior to visit.     Allergies  Allergen Reactions   Latex Hives   Penicillins Hives   Codeine Anxiety   Cortisone Swelling   Sulfa Antibiotics     Review Of Systems:  Constitutional:   No  weight loss, night sweats,  Fevers, chills, fatigue, or  lassitude.  HEENT:   No headaches,  Difficulty swallowing,  Tooth/dental problems, or  Sore throat,                No sneezing, itching, ear ache, nasal congestion, post nasal drip,   CV:  No chest pain,  Orthopnea, PND, swelling in lower extremities, anasarca, dizziness, palpitations, syncope.   GI  No heartburn, indigestion, abdominal pain, nausea, vomiting, diarrhea, change in bowel habits, loss of appetite, bloody stools.   Resp: No shortness of breath with exertion or at rest.  No excess mucus, no productive cough,  No non-productive cough,  No coughing up of blood.  No change in color of mucus.  No wheezing.  No chest wall deformity  Skin: no rash or lesions.  GU: no dysuria, change in color of urine, no urgency or frequency.  No flank pain, no hematuria   MS:  No joint pain or swelling.  No decreased range of motion.  No back pain.  Psych:  No change in mood or affect. No depression or anxiety.  No memory  loss.   Vital Signs BP 131/83   Pulse 87   Temp 98 F (36.7 C) (Temporal)   Ht 5' 5 (1.651 m)   Wt 203 lb (92.1 kg)   SpO2 98%   BMI 33.78 kg/m    Physical Exam:  General- No distress,  A&Ox3, appropriate ENT: No sinus tenderness, TM clear, pale nasal mucosa, no oral exudate,no post nasal drip, no LAN Cardiac: S1, S2, regular rate and rhythm, no  murmur Chest: No wheeze/ rales/ dullness; no accessory muscle use, no nasal flaring, no sternal retractions Abd.: Soft Non-tender, ND, BS +, Body mass index is 33.78 kg/m.  Ext: No clubbing cyanosis, edema Neuro:  normal strength, MAE x 4, A&O x 3 Skin: No rashes, warm and dry, no skin lesions  Psych: normal mood and behavior    Assessment & Plan Pulmonary lung nodules on imaging - Order follow-up CT scan of the chest in three months to monitor nodules. - Schedule follow-up appointment in three months to review CT scan results. - Call if you change your mind and would like to get biopsy done now.   Asthma Asthma well-controlled with no exacerbations.  Daily albuterol  use noted.  No adverse effects from [facility] despite flagged corticosteroid allergy. - Refill Wixela inhaler sent - Refill albuterol  inhaler sent - Instruct to use albuterol  inhaler one to two puffs every six hours as needed for wheezing or shortness of breath. - Call for signs/symptoms of asthma flare  I spent 20 minutes dedicated to the care of this patient on the date of this encounter to include pre-visit review of records, face-to-face time with the patient discussing conditions above, post visit ordering of testing, clinical documentation with the electronic health record, making appropriate referrals as documented, and communicating necessary information to the patient's healthcare team.      Lauraine JULIANNA Lites, NP 09/05/2023  8:40 AM

## 2023-09-07 NOTE — Progress Notes (Signed)
 I agree with the plans as outlined above.   Lamar Chris, MD, PhD 09/07/2023, 9:37 AM [facility] [phone] or if no answer before 7:00PM call [phone] For any issues after 7:00PM please call eLink [phone]

## 2023-09-25 ENCOUNTER — Other Ambulatory Visit: Payer: Self-pay | Admitting: Family Medicine

## 2023-09-25 DIAGNOSIS — I10 Essential (primary) hypertension: Secondary | ICD-10-CM

## 2023-10-02 ENCOUNTER — Other Ambulatory Visit: Payer: Self-pay | Admitting: Family Medicine

## 2023-10-04 ENCOUNTER — Other Ambulatory Visit: Payer: Self-pay | Admitting: Family Medicine

## 2023-10-04 DIAGNOSIS — M1991 Primary osteoarthritis, unspecified site: Secondary | ICD-10-CM

## 2023-10-04 DIAGNOSIS — I10 Essential (primary) hypertension: Secondary | ICD-10-CM

## 2023-10-05 ENCOUNTER — Ambulatory Visit: Admitting: Acute Care

## 2023-10-05 ENCOUNTER — Telehealth: Payer: Self-pay | Admitting: Nurse Practitioner

## 2023-10-05 ENCOUNTER — Ambulatory Visit

## 2023-10-05 ENCOUNTER — Ambulatory Visit: Admitting: Primary Care

## 2023-10-05 DIAGNOSIS — R911 Solitary pulmonary nodule: Secondary | ICD-10-CM

## 2023-10-05 DIAGNOSIS — J4541 Moderate persistent asthma with (acute) exacerbation: Secondary | ICD-10-CM | POA: Diagnosis not present

## 2023-10-05 LAB — PULMONARY FUNCTION TEST
DL/VA % pred: 111 %
DL/VA: 4.53 ml/min/mmHg/L
DLCO unc % pred: 72 %
DLCO unc: 15.38 ml/min/mmHg
FEF 25-75 Post: 3.21 L/s
FEF 25-75 Pre: 1.66 L/s
FEF2575-%Change-Post: 93 %
FEF2575-%Pred-Post: 157 %
FEF2575-%Pred-Pre: 81 %
FEV1-%Change-Post: 13 %
FEV1-%Pred-Post: 75 %
FEV1-%Pred-Pre: 66 %
FEV1-Post: 1.89 L
FEV1-Pre: 1.67 L
FEV1FVC-%Change-Post: 7 %
FEV1FVC-%Pred-Pre: 107 %
FEV6-%Change-Post: 5 %
FEV6-%Pred-Post: 68 %
FEV6-%Pred-Pre: 64 %
FEV6-Post: 2.17 L
FEV6-Pre: 2.05 L
FEV6FVC-%Pred-Post: 104 %
FEV6FVC-%Pred-Pre: 104 %
FVC-%Change-Post: 5 %
FVC-%Pred-Post: 65 %
FVC-%Pred-Pre: 61 %
FVC-Post: 2.17 L
FVC-Pre: 2.05 L
Post FEV1/FVC ratio: 87 %
Post FEV6/FVC ratio: 100 %
Pre FEV1/FVC ratio: 81 %
Pre FEV6/FVC Ratio: 100 %
RV % pred: 69 %
RV: 1.62 L
TLC % pred: 69 %
TLC: 3.79 L

## 2023-10-05 NOTE — Telephone Encounter (Signed)
 PT called stated that pharmacy needs refill for Tramadol  prescription. Please give patient a call. TY

## 2023-10-05 NOTE — Telephone Encounter (Signed)
 Spoke with pharmacy, asked for medication to be filled for patient. Pharmacist stated Tramadol  would be ready within the hour. Patient notified.

## 2023-10-05 NOTE — Progress Notes (Signed)
 Full pft performed today

## 2023-10-05 NOTE — Patient Instructions (Signed)
 Full pft performed today

## 2023-10-08 ENCOUNTER — Ambulatory Visit: Admitting: Pulmonary Disease

## 2023-10-08 ENCOUNTER — Encounter: Payer: Self-pay | Admitting: Pulmonary Disease

## 2023-10-08 VITALS — BP 134/70 | HR 79 | Temp 98.5°F | Ht 66.0 in | Wt 205.0 lb

## 2023-10-08 DIAGNOSIS — Z87891 Personal history of nicotine dependence: Secondary | ICD-10-CM

## 2023-10-08 DIAGNOSIS — J4541 Moderate persistent asthma with (acute) exacerbation: Secondary | ICD-10-CM

## 2023-10-08 DIAGNOSIS — R911 Solitary pulmonary nodule: Secondary | ICD-10-CM | POA: Diagnosis not present

## 2023-10-08 DIAGNOSIS — R942 Abnormal results of pulmonary function studies: Secondary | ICD-10-CM

## 2023-10-08 NOTE — Progress Notes (Signed)
 Rachael West    [id number]    29-Dec-1955  Primary Care Physician:Clapp, Saddie FALCON, PA-C  Referring Physician: Gayle Saddie FALCON, PA-C [address],  [place] [address]  Chief complaint: Follow-up for asthma, lung nodule  HPI: 68 y.o. 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  Discussed the use of AI scribe software for clinical note transcription with the patient, who gave verbal consent to proceed.  History of Present Illness Rachael West is a 68 year old female with asthma who presents with worsening shortness of breath.  She has experienced a tight chest and worsening shortness of breath over the past week.  She reports wheezing and shortness of breath. Her asthma, diagnosed in 1996, has been managed with a nebulizer, which is now malfunctioning, and a Wixela inhaler used once daily. She also uses Albuterol  but is not currently taking Singulair , despite it being on her medication list. Her symptoms have persisted without additional treatments or consultations since she worsened. She last used prednisone  for asthma in 1998.  Her past medical history includes hepatitis C, cirrhosis, portal hypertension, esophageal varices, and a history of lymphoma treated with chemotherapy in 2016.  No current smoking, exposure to pets, mold, or feather bedding.  Interim history: Rachael West is a 68 year old female with asthma who presents for follow-up of her asthma management and lung nodule evaluation.  Asthma symptoms and management - Asthma symptoms have improved with regular use of Wixela, montelukast , and nebulizer therapy. - Completed a five-day course of prednisone ,  resulting in better breathing.  Pulmonary nodule evaluation - Ground glass lung nodule identified on CT scan. - Nodule demonstrated mild activity on PET scan. - Consulted with lung nodule clinic Teddi Lites NP) and decision made to monitor with a follow-up scan in 3 months - Recent lung function tests performed.  Relevant pulmonary history Pets: No pets Occupation: Used to work in a bank Exposures: No mold, hot tub, Financial controller.  No feather pillows or comforters No h/o chemo/XRT/amiodarone/macrodantin/MTX  No exposure to asbestos, silica or other organic allergens  Smoking history: 30-pack-year smoker.  Quit in 1992 Travel history: No significant travel history Relevant family history:Her family history includes COPD in her sister and mother, both of whom smoked.  Outpatient Encounter Medications as of 10/08/2023  Medication Sig   linaclotide  (LINZESS ) 290 MCG CAPS capsule Take 290 mcg by mouth daily before breakfast.   Magnesium  Oxide -Mg Supplement 500 MG CAPS Take 1 capsule (500 mg total) by mouth in the morning and at bedtime.   Wheat Dextrin (BENEFIBER) POWD Take 6 g by mouth 3 (three) times daily before meals. (2 tsp = 6 g)   albuterol  (VENTOLIN  HFA) 108 (90 Base) MCG/ACT inhaler Inhale 1-2 puffs into the lungs every 6 (six) hours as needed for wheezing or shortness of breath.   atenolol  (TENORMIN ) 25 MG tablet TAKE 1 TABLET (25 MG TOTAL) BY MOUTH DAILY.   b complex vitamins tablet Take 1 tablet by mouth daily.   busPIRone  (BUSPAR ) 10 MG tablet Take 1 tablet (10 mg total) by mouth 3 (three) times daily.   dimenhyDRINATE  (DRAMAMINE) 50 MG tablet Take 1 tablet (50 mg total) by mouth every 8 (eight) hours as needed.   fluticasone -salmeterol (  WIXELA INHUB ) 500-50 MCG/ACT AEPB Inhale 1 puff into the lungs in the morning and at bedtime.   fluticasone -salmeterol (WIXELA INHUB ) 500-50 MCG/ACT AEPB Inhale 1 puff into the lungs in the morning and at bedtime. Pt has been taking this for years  without any issues,   gabapentin  (NEURONTIN ) 100 MG capsule Take 2 capsules (200 mg total) by mouth at bedtime. Follow written titration schedule.   hydrOXYzine  (ATARAX ) 50 MG tablet Take 1 tablet (50 mg total) by mouth at bedtime as needed.   ipratropium-albuterol  (DUONEB) 0.5-2.5 (3) MG/3ML SOLN INHALE 3 MLS VIA NEBULIZER AS DIRECTED   Lactobacillus Rhamnosus, GG, (CULTURELLE) CAPS Take 1 capsule by mouth daily.   latanoprost  (XALATAN ) 0.005 % ophthalmic solution Place 1 drop into both eyes at bedtime.   lisinopril  (ZESTRIL ) 5 MG tablet TAKE 1 TABLET (5 MG TOTAL) BY MOUTH DAILY.   meloxicam  (MOBIC ) 15 MG tablet TAKE 1 TABLET (15 MG TOTAL) BY MOUTH DAILY.   mirabegron  ER (MYRBETRIQ ) 25 MG TB24 tablet Take 1 tablet (25 mg total) by mouth daily.   montelukast  (SINGULAIR ) 10 MG tablet Take 1 tablet (10 mg total) by mouth at bedtime.   naloxone  (NARCAN ) nasal spray 4 mg/0.1 mL Place 1 spray into the nose as needed for up to 365 doses (for opioid-induced respiratory depresssion). In case of emergency (overdose), spray once into each nostril. If no response within 3 minutes, repeat application and call 911.   omeprazole  (PRILOSEC) 40 MG capsule TAKE 1 CAPSULE BY MOUTH EVERY DAY   ondansetron  (ZOFRAN ) 4 MG tablet Take 1 tablet (4 mg total) by mouth 2 (two) times daily.   paliperidone  (INVEGA ) 6 MG 24 hr tablet TAKE 1 TABLET (6 MG TOTAL) BY MOUTH EVERY MORNING.   polyethylene glycol (MIRALAX / GLYCOLAX) packet Take 17 g by mouth 2 (two) times daily.   prazosin (MINIPRESS) 1 MG capsule Take 2 mg by mouth at bedtime.   sertraline  (ZOLOFT ) 100 MG tablet Take 200 mg by mouth every morning.   traMADol  (ULTRAM ) 50 MG tablet Take 1 tablet (50 mg total) by mouth every 6 (six) hours as needed for severe pain (pain score 7-10). Each refill must last 30 days.   traZODone (DESYREL) 50 MG tablet 1 TABLET BY MOUTH AT BEDTIME   No facility-administered encounter medications on file as of 10/08/2023.   Vitals:    10/08/23 1143  BP: 134/70  Pulse: 79  Temp: 98.5 F (36.9 C)  Height: 5' 6 (1.676 m)  Weight: 205 lb (93 kg)  SpO2: 99%  TempSrc: Oral  BMI (Calculated): 33.1     Physical Exam GEN: No acute distress. CV: Regular rate and rhythm, no murmurs. LUNGS: Clear to auscultation bilaterally, normal respiratory effort. SKIN JOINTS: Warm and dry, no rash.    Data Reviewed: Imaging: CT chest 07/30/2019-13 mm groundglass nodule in the left lower lobe which is minimally progressive compared to prior.  Stable platelike atelectasis, scarring the right middle lobe, lingula  CT chest 07/20/2023-increase in size of ground glass nodule in the left lower lobe with more cystic changes.  Interval development of nodular opacity in the left fissure  PET scan 08/10/2023-low-level FDG uptake in left lower lobe lung nodule.  Lingular nodule is not metabolic. I have reviewed the images personally.  PFTs: 11/20/2014 FEV1 1.78 [81%], FVC 2.13, F/F84 Mild restrictive physiology  10/05/2023 FVC 2.17 [65%], FEV1 1.89 [75%], F/F87, TLC 3.79 [69%], DLCO 15.38 [72%] Bronchodilator response, restriction, minimal diffusion defect  Labs: CBC 03/05/2023-WBC 5.9, eos  2%, absolute eosinophil count 118  Assessment & Plan Asthma Chronic asthma with recent exacerbation characterized by chest tightness and dyspnea, especially during conversation. Suboptimal medication adherence with Wixela and Singulair . Non-functional nebulizer hindering Duoneb use. Wheezing reported but not auscultated. Last prednisone  use in 1998. - Increase Wixela to twice daily, one puff in the morning and one at bedtime. - Restart Singulair  (montelukast ). - Order new nebulizer machine and supply of Duoneb. - Prescribe prednisone  for 5 days to regain control of symptoms. - Schedule lung function test.  Lung nodule Groundglass lung nodule on left side noted on CT in 2021. Given smoking history and history of lymphoma, follow-up imaging is  warranted. Previous atelectasis findings noted as benign. - Order chest CT to evaluate lung nodule and follow up on previous findings of atelectasis.  Cirrhosis and hepatitis C Lymphoma Lymphoma treated with chemotherapy in 2016. No active management discussed during this visit.   Asthma Asthma is well-controlled with the current medication regimen, including Wixela twice daily, montelukast , and nebulizer use. Recently completed a short prednisone  taper. Lung function test shows signs of asthma but is being managed effectively. - Continue Wixela twice daily - Continue montelukast  - Continue nebulizer use - Follow up in one year  Mild restrictive lung defect Lung function test indicates a mild restrictive lung defect with reduced lung capacity. This is being monitored alongside asthma management.  Pulmonary nodule under surveillance Progressive ground glass, cystic pulmonary nodule noted on CT scan with low-level activity on PET scan.  Patient has discussed options with nurse practitioner Lauraine Lites and opted for surveillance rather than biopsy. Follow-up CT scan scheduled for December. - Ensure follow-up CT scan in December - Follow up in the nodule Clinic after CT scan  Recommendations: Continue Wixela, montelukast , nebs as needed Follow-up in nodule clinic after CT scan  Lonna Coder MD [facility] 10/08/2023, 11:40 AM  CC: Gayle Saddie FALCON, PA-C

## 2023-10-08 NOTE — Patient Instructions (Signed)
  VISIT SUMMARY: You had a follow-up visit to discuss your asthma management and the evaluation of a lung nodule. Your asthma is well-controlled with your current medications, and you recently completed a prednisone  course. We also reviewed the plan for monitoring a lung nodule found on your CT scan.  YOUR PLAN: ASTHMA: Your asthma is well-controlled with your current medications. -Continue taking Wixela twice daily. -Continue taking montelukast . -Continue using the nebulizer as needed. -Follow up in one year.  MILD RESTRICTIVE LUNG DEFECT: Your lung function test indicates a mild restrictive lung defect, which is being monitored. -Continue monitoring alongside asthma management.  PULMONARY NODULE: A lung nodule was found on your CT scan and showed mild activity on the PET scan. You have opted for surveillance rather than a biopsy. -Ensure you have a follow-up CT scan in December. -Follow up in the Nodule Clinic after your CT scan.

## 2023-11-05 ENCOUNTER — Other Ambulatory Visit: Payer: Self-pay

## 2023-11-24 ENCOUNTER — Ambulatory Visit (HOSPITAL_BASED_OUTPATIENT_CLINIC_OR_DEPARTMENT_OTHER)
Admission: RE | Admit: 2023-11-24 | Discharge: 2023-11-24 | Disposition: A | Discharge status: Home / Self Care | Source: Ambulatory Visit | Attending: Acute Care | Admitting: Acute Care

## 2023-11-24 DIAGNOSIS — R911 Solitary pulmonary nodule: Secondary | ICD-10-CM | POA: Diagnosis present

## 2023-12-11 ENCOUNTER — Telehealth: Payer: Self-pay | Admitting: Acute Care

## 2023-12-11 ENCOUNTER — Ambulatory Visit: Admitting: Acute Care

## 2023-12-11 ENCOUNTER — Encounter: Payer: Self-pay | Admitting: Acute Care

## 2023-12-11 ENCOUNTER — Encounter: Payer: Self-pay | Admitting: Emergency Medicine

## 2023-12-11 VITALS — BP 127/79 | HR 95 | Temp 98.4°F | Ht 66.0 in | Wt 206.8 lb

## 2023-12-11 DIAGNOSIS — R911 Solitary pulmonary nodule: Secondary | ICD-10-CM | POA: Diagnosis not present

## 2023-12-11 DIAGNOSIS — R9389 Abnormal findings on diagnostic imaging of other specified body structures: Secondary | ICD-10-CM

## 2023-12-11 DIAGNOSIS — Z87891 Personal history of nicotine dependence: Secondary | ICD-10-CM

## 2023-12-11 NOTE — Patient Instructions (Signed)
 It is good to see you today. We have reviewed your CT Chest. The nodules we were worried about in August have grown. Plan is for a biopsy of these two left sided nodules so we know what these nodules are definitively. I have placed an order for a bronchoscopy with biopsies.  We have discussed the procedure in detail.  We have reviewed the risks and benefits of the procedure. These include bleeding, infection, puncture of the lung, and adverse reaction to anesthesia. You have agreed to proceed with biopsy to evaluate the left lower lobe nodule. Your procedure will be done by Dr. Lamar Chris. You will receive a letter today with date time and information pertaining to the procedure. You will need someone to drive you to the procedure, stay with you during the procedure, and stay with you after the procedure. You will also need someone to stay with you for 24 hours after anesthesia to ensure you have cleared and are doing well. You will follow-up with me 1 week after the procedure to review the results and to ensure you are doing well. Call if you need us  prior to the procedure or if you have any questions at all. Please contact office for sooner follow up if symptoms do not improve or worsen or seek emergency care

## 2023-12-11 NOTE — H&P (View-Only) (Signed)
 History of Present Illness Rachael West is a 68 y.o. female former smoker followed by Dr. Theophilus for asthma and referred to Dr. Shelah for evaluation of pulmonary nodules found on Imaging.   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    12/11/2023 Discussed the use of AI scribe software for clinical note transcription with the patient, who gave verbal consent to proceed.  History of Present Illness Rachael West is a 68 year old female who presents for evaluation of pulmonary nodules.  When last seen 08/2023, we recommended a bronchoscopy at that time. Patient preferred to do a 3 month follow up scan, despite our recommendation. She understood the risks. She is here today to review the 3 month follow up Ct Chest.   We have reviewed her scan done 11/24/2023. This showed that 2 of the nodules of concern have increased in size.   She has a history of pulmonary nodules initially identified on a CT scan in August. A biopsy was considered but deferred at that time. Since then, the nodules have shown growth, prompting further evaluation. The CT scan from August revealed an atypical cavitary cyst in the left lower lobe measuring 17 by 21 mm. There is also a stable 6 mm nodule in the left lower lobe and a stable 8 mm nodule with a 5 mm solid component. Another nodule has increased in size from 7 by 17 mm to 10 by 21 mm.  A previous PET scan was performed, showing that a 1.5 cm nodule had an SUV of 2.1. Another small nodule showed low hypermetabolism, while a 5 mm nodule did not show hypermetabolism.  She experiences pain when bending over, described as 'something that's hurting you through there.' She is currently taking meloxicam   daily, but the pain persists. No use of blood thinners or aspirin, and no history of sleep apnea or diabetes. She has a known latex allergy. She will be scheduled for a bronchoscopy with biopsy of the left lower lobe cavitary lesion, and the  subpleural lingular nodule.     Test Results: CT Chest 11/24/2023  Atypical pulmonary cyst in the left lower lobe has increased to 17 x 21 mm with up to 4 mm spiculated wall thickening, highly suspicious for cavitary malignancy. Multidisciplinary thoracic consultation is recommended for further management. Although prior PET/CT (July 2025) showed no definite hypermetabolism, this does not exclude malignancy. 2. Subpleural lingular nodule has increased to 10 x 21 mm from 7 x 17 mm previously. 3. Multiple additional solid and peripheral nodules are stable, as described above, including Stable 6 mm solid nodule in the left lower lobe and Stable 8 mm part-solid nodule in the left lower lobe with a 5 mm solid component.  PET scan 08/10/2023 The 1.5 cm ground-glass and cystic nodule identified on recent chest CT shows only low level FDG uptake with SUV max = 2.1. As low-grade or well differentiated neoplasm can be poorly FDG avid, continued close follow-up warranted. 2. The new perifissural lingular nodule identified on the recent CT at 1.7 cm long axis is non hypermetabolic with SUV max = 1.9. Close attention on follow-up recommended. 3. 5 mm posterior right upper lobe nodule identified previously shows no hypermetabolism on PET imaging. Attention on follow-up. 4. 5 mm nodule identified anterior left upper lobe previously is not discernible on  CT imaging today and may have resolved. There is no hypermetabolism in this region on PET imaging. 5. No evidence for hypermetabolic metastatic disease in the neck, abdomen, or pelvis. 6.  Aortic Atherosclerosis (ICD10-I70.0).   07/20/2023 CT Chest The ground-glass nodule in the left lower lobe now  demonstrates more cystic change and has increased in size in the interim, measuring 1.5 x 1.3 x 1.5 cm. There is some new soft tissue nodularity and increasing surrounding ground-glass opacity associated with this nodule, which now may communicate with a an adjacent segmental bronchus. This is worrisome for underlying neoplasm. A follow-up PET-CT should be considered for further characterization. 2. Interval development of a nodular opacity abutting the inferior left fissure in the lingula, measuring 0.7 x 1.7 cm. A few additional scattered small nodules are new within both lungs measuring up to 5 mm. These could be infectious or inflammatory or metastatic in etiology.    Latest Ref Rng & Units 03/05/2023    8:08 AM 10/30/2022    8:35 AM 07/05/2022   10:10 AM  CBC  WBC 3.4 - 10.8 x10E3/uL 5.9  4.5  5.5   Hemoglobin 11.1 - 15.9 g/dL 87.3  88.9  88.5   Hematocrit 34.0 - 46.6 % 39.3  35.2  36.7   Platelets 150 - 450 x10E3/uL 272  247  229        Latest Ref Rng & Units 03/05/2023    8:08 AM 10/30/2022    8:35 AM 07/05/2022   10:10 AM  BMP  Glucose 70 - 99 mg/dL 88  79  79   BUN 8 - 27 mg/dL 8  6  8    Creatinine 0.57 - 1.00 mg/dL 9.18  9.19  9.10   BUN/Creat Ratio 12 - 28 10  8  9    Sodium 134 - 144 mmol/L 140  138  140   Potassium 3.5 - 5.2 mmol/L 4.6  4.7  4.7   Chloride 96 - 106 mmol/L 100  100  102   CO2 20 - 29 mmol/L 23  25  25    Calcium  8.7 - 10.3 mg/dL 9.2  9.1  9.2     BNP No results found for: BNP  ProBNP No results found for: PROBNP  PFT    Component Value Date/Time   FEV1PRE 1.67 10/05/2023 0808   FEV1POST 1.89 10/05/2023 0808   FVCPRE 2.05 10/05/2023 0808   FVCPOST 2.17 10/05/2023 0808   TLC 3.79 10/05/2023 0808   DLCOUNC 15.38 10/05/2023 0808   PREFEV1FVCRT 81 10/05/2023 0808   PSTFEV1FVCRT 87 10/05/2023 0808    CT CHEST WO CONTRAST Result Date: 11/26/2023 EXAM: CT CHEST WITHOUT CONTRAST 11/24/2023 01:20:49 PM TECHNIQUE: CT of the chest was  performed without the administration of intravenous contrast. Multiplanar reformatted images are provided for review. Automated exposure control, iterative reconstruction, and/or weight based adjustment of the mA/kV was utilized to reduce the radiation dose to as low as reasonably achievable. COMPARISON: Comparison made to 07/20/2023. CLINICAL HISTORY: Lung nodule, 6-7mm. FINDINGS: MEDIASTINUM: Global cardiac size within normal limits. No pericardial effusion. No significant coronary artery calcifications. Central pulmonary arteries are of normal caliber. Mild atherosclerotic calcification within the thoracic aorta. No aortic aneurysm. The central airways are clear. LYMPH NODES: No mediastinal, hilar or axillary lymphadenopathy. LUNGS AND PLEURA: Atypical pulmonary cysts within the left lower lobe (image 74 / 3) demonstrate increasing overall size, now measuring 17 x 21 mm, as well as thickening of the spiculated normal which now measures up to 4  mm in thickness. This is highly suspicious for a cavitary malignancy. Multidisciplinary thoracic consultation is recommended for further management. Though no definite hypermetabolism was identified on prior PET CT examination of 02/10/2023, lack of hypermetabolism would not completely exclude the likelihood of underlying malignancy. Stable 6 mm nodule within the left lower lobe (image 69 / 3). Stable 8 mm part solid nodule within the left lower lobe (image 57 / 3) demonstrating a 5 mm solid component. Subpleural nodule within the lingula measuring 10 x 21 mm demonstrates interval increase in size since prior examination, previously measuring 7 x 17 mm (image 78 / 3). Stable 6 mm nodule within the right upper lobe (image 43 / 3). Stable parenchymal scarring within the mid lung zones bilaterally. No pulmonary edema. No pleural effusion or pneumothorax. SOFT TISSUES/BONES: No acute abnormality of the bones or soft tissues. UPPER ABDOMEN: Limited images of the upper abdomen  demonstrates no acute abnormality. IMPRESSION: 1. Atypical pulmonary cyst in the left lower lobe has increased to 17 x 21 mm with up to 4 mm spiculated wall thickening, highly suspicious for cavitary malignancy. Multidisciplinary thoracic consultation is recommended for further management. Although prior PET/CT (July 2025) showed no definite hypermetabolism, this does not exclude malignancy. 2. Subpleural lingular nodule has increased to 10 x 21 mm from 7 x 17 mm previously. 3. Multiple additional solid and peripheral nodules are stable, as described above, including Stable 6 mm solid nodule in the left lower lobe and Stable 8 mm part-solid nodule in the left lower lobe with a 5 mm solid component. Electronically signed by: Dorethia Molt MD 11/26/2023 01:02 AM EST RP Workstation: [id number]     Past medical hx Past Medical History:  Diagnosis Date   Anemia    Anxiety    Arthritis    Asthma    Depression    GERD (gastroesophageal reflux disease)    Hepatitis-C    History of chemotherapy    Hypertension    Insomnia    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   Lymphoma (HCC) 12/2013   aggressive lymphoma   Personal history of non-Hodgkin lymphomas    Vitamin D  deficiency      Social History   Tobacco Use   Smoking status: Former    Current packs/day: 0.00    Average packs/day: 1.5 packs/day for 20.0 years (30.0 ttl pk-yrs)    Types: Cigarettes    Start date: 01/09/1970    Quit date: 01/09/1990    Years since quitting: 33.9    Passive exposure: Past   Smokeless tobacco: Never  Vaping Use   Vaping status: Never Used  Substance Use Topics   Alcohol use: No    Alcohol/week: 0.0 standard drinks of alcohol   Drug use: No    Ms.Murray-Miller reports that she quit smoking about 33 years ago. Her smoking use included cigarettes. She started smoking about 53 years ago. She has a 30 pack-year smoking history. She has been exposed to tobacco smoke. She has never used smokeless tobacco. She  reports that she does not drink alcohol and does not use drugs.  Tobacco Cessation: Counseling given: Not Answered Former smoker with a 30 pack year smoking history  Past surgical hx, Family hx, Social hx all reviewed.  Current Outpatient Medications on File Prior to Visit  Medication Sig   albuterol  (VENTOLIN  HFA) 108 (90 Base) MCG/ACT inhaler Inhale 1-2 puffs into the lungs every 6 (six) hours as needed for wheezing or shortness of breath.   atenolol  (TENORMIN )  25 MG tablet TAKE 1 TABLET (25 MG TOTAL) BY MOUTH DAILY.   b complex vitamins tablet Take 1 tablet by mouth daily.   busPIRone  (BUSPAR ) 10 MG tablet Take 1 tablet (10 mg total) by mouth 3 (three) times daily.   dimenhyDRINATE  (DRAMAMINE) 50 MG tablet Take 1 tablet (50 mg total) by mouth every 8 (eight) hours as needed.   fluticasone -salmeterol (WIXELA INHUB ) 500-50 MCG/ACT AEPB Inhale 1 puff into the lungs in the morning and at bedtime. Pt has been taking this for years without any issues,   gabapentin  (NEURONTIN ) 100 MG capsule Take 2 capsules (200 mg total) by mouth at bedtime. Follow written titration schedule.   hydrOXYzine  (ATARAX ) 50 MG tablet TAKE 1 TABLET BY MOUTH EVERY DAY AT BEDTIME AS NEEDED   ipratropium-albuterol  (DUONEB) 0.5-2.5 (3) MG/3ML SOLN INHALE 3 MLS VIA NEBULIZER AS DIRECTED   Lactobacillus Rhamnosus, GG, (CULTURELLE) CAPS Take 1 capsule by mouth daily.   latanoprost  (XALATAN ) 0.005 % ophthalmic solution Place 1 drop into both eyes at bedtime.   linaclotide  (LINZESS ) 290 MCG CAPS capsule Take 290 mcg by mouth daily before breakfast.   lisinopril  (ZESTRIL ) 5 MG tablet TAKE 1 TABLET (5 MG TOTAL) BY MOUTH DAILY.   Magnesium  Oxide -Mg Supplement 500 MG CAPS Take 1 capsule (500 mg total) by mouth in the morning and at bedtime.   meloxicam  (MOBIC ) 15 MG tablet TAKE 1 TABLET (15 MG TOTAL) BY MOUTH DAILY.   mirabegron  ER (MYRBETRIQ ) 25 MG TB24 tablet Take 1 tablet (25 mg total) by mouth daily.   montelukast   (SINGULAIR ) 10 MG tablet Take 1 tablet (10 mg total) by mouth at bedtime.   naloxone  (NARCAN ) nasal spray 4 mg/0.1 mL Place 1 spray into the nose as needed for up to 365 doses (for opioid-induced respiratory depresssion). In case of emergency (overdose), spray once into each nostril. If no response within 3 minutes, repeat application and call 911.   omeprazole  (PRILOSEC) 40 MG capsule TAKE 1 CAPSULE BY MOUTH EVERY DAY   ondansetron  (ZOFRAN ) 4 MG tablet Take 1 tablet (4 mg total) by mouth 2 (two) times daily.   paliperidone  (INVEGA ) 6 MG 24 hr tablet TAKE 1 TABLET (6 MG TOTAL) BY MOUTH EVERY MORNING.   polyethylene glycol (MIRALAX / GLYCOLAX) packet Take 17 g by mouth 2 (two) times daily.   prazosin (MINIPRESS) 1 MG capsule Take 2 mg by mouth at bedtime.   sertraline  (ZOLOFT ) 100 MG tablet Take 200 mg by mouth every morning.   traMADol  (ULTRAM ) 50 MG tablet Take 1 tablet (50 mg total) by mouth every 6 (six) hours as needed for severe pain (pain score 7-10). Each refill must last 30 days.   traZODone (DESYREL) 50 MG tablet 1 TABLET BY MOUTH AT BEDTIME   Wheat Dextrin (BENEFIBER) POWD Take 6 g by mouth 3 (three) times daily before meals. (2 tsp = 6 g)   No current facility-administered medications on file prior to visit.     Allergies  Allergen Reactions   Latex Hives   Penicillins Hives   Codeine Anxiety   Cortisone Swelling   Sulfa Antibiotics     Review Of Systems:  Constitutional:   No  weight loss, night sweats,  Fevers, chills, fatigue, or  lassitude.  HEENT:   No headaches,  Difficulty swallowing,  Tooth/dental problems, or  Sore throat,                No sneezing, itching, ear ache, nasal congestion, post nasal drip,  CV:  No chest pain,  Orthopnea, PND, swelling in lower extremities, anasarca, dizziness, palpitations, syncope.   GI  No heartburn, indigestion, abdominal pain, nausea, vomiting, diarrhea, change in bowel habits, loss of appetite, bloody stools.   Resp: No  shortness of breath with exertion or at rest.  No excess mucus, no productive cough,  No non-productive cough,  No coughing up of blood.  No change in color of mucus.  No wheezing.  No chest wall deformity  Skin: no rash or lesions.  GU: no dysuria, change in color of urine, no urgency or frequency.  No flank pain, no hematuria   MS:  No joint pain or swelling.  No decreased range of motion.  No back pain.  Psych:  No change in mood or affect. No depression or anxiety.  No memory loss.   Vital Signs BP 127/79   Pulse 95   Temp 98.4 F (36.9 C) (Oral)   Ht 5' 6 (1.676 m)   Wt 206 lb 12.8 oz (93.8 kg)   SpO2 98%   BMI 33.38 kg/m     Physical Exam GENERAL: No distress, alert and oriented times 3. EARS NOSE THROAT: No sinus tenderness, tympanic membranes clear, pale nasal mucosa, no oral exudate, no post nasal drip, no lymphadenopathy. CHEST: No wheeze, rales, dullness, no accessory muscle use, no nasal flaring, no sternal retractions. CARDIAC: S1, S2, regular rate and rhythm, no murmur. ABDOMINAL: Soft, non tender. ND, BS present, EXTREMITIES: No clubbing, cyanosis, edema. No obvious deformities NEUROLOGICAL: Normal strength. Alert and oriented x 3, MAE x 4 SKIN: No rashes, warm and dry. No obvious skin lesions PSYCHIATRIC: Normal mood and behavior.   Assessment/Plan Assessment & Plan  Multiple left lung nodules have increased in size.   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  Former smoker  - Scheduled biopsy of growing nodules with Dr. Shelah. - Needs family member to take her to procedure, stay during the procedure and  - Provided letter with procedure details and follow-up instructions. - Will follow up one week post-biopsy to discuss results.  AVS 12/11/2023 We have reviewed your CT Chest. The nodules we were worried about in August have grown. Plan is for a biopsy of these two left sided nodules so we know what these nodules are definitively. I  have placed an order for a bronchoscopy with biopsies.  We have discussed the procedure in detail.  We have reviewed the risks and benefits of the procedure. These include bleeding, infection, puncture of the lung, and adverse reaction to anesthesia. You have agreed to proceed with biopsy to evaluate the left lower lobe nodule. Your procedure will be done by Dr. Lamar Shelah. You will receive a letter today with date time and information pertaining to the procedure. You will need someone to drive you to the procedure, stay with you during the procedure, and stay with you after the procedure. You will also need someone to stay with you for 24 hours after anesthesia to ensure you have cleared and are doing well. You will follow-up with me 1 week after the procedure to review the results and to ensure you are doing well. Call if you need us  prior to the procedure or if you have any questions at all. Please contact office for sooner follow up if symptoms do not improve or worsen or seek emergency care     [facility] [address] [place] [address]   Dear [name],  The following has been scheduled for you:   Procedure: Bronchoscopy                 Surgeon:  Dr Shelah     Procedure date:  12/18/23 at 2:40 pm  Arrival Time:  12:10 pm Location: [facility] [address] Entrance [address] [place], [place] [address]   Additional Information: Do not eat anything after midnight - ok to take medications on the day of procedure with only sips of water You will need someone to drive you home after this procedure & to be with you for the next 24 hours You may receive a call from the hospital a day or two prior to procedure to go over additional instructions & medications  NEXT VISIT:  12/24/23 at 10:30 am With Lauraine Lites, NP at    [facility]    [address] [place], [place]  [address]       I spent 25 minutes dedicated to the  care of this patient on the date of this encounter to include pre-visit review of records, face-to-face time with the patient discussing conditions above, post visit ordering of testing, clinical documentation with the electronic health record, making appropriate referrals as documented, and communicating necessary information to the patient's healthcare team.     Lauraine JULIANNA Lites, NP 12/11/2023  8:33 AM

## 2023-12-11 NOTE — Progress Notes (Signed)
 History of Present Illness Rachael West is a 68 y.o. female former smoker followed by Dr. Theophilus for asthma and referred to Dr. Shelah for evaluation of pulmonary nodules found on Imaging.   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    12/11/2023 Discussed the use of AI scribe software for clinical note transcription with the patient, who gave verbal consent to proceed.  History of Present Illness Rachael West is a 68 year old female who presents for evaluation of pulmonary nodules.  When last seen 08/2023, we recommended a bronchoscopy at that time. Patient preferred to do a 3 month follow up scan, despite our recommendation. She understood the risks. She is here today to review the 3 month follow up Ct Chest.   We have reviewed her scan done 11/24/2023. This showed that 2 of the nodules of concern have increased in size.   She has a history of pulmonary nodules initially identified on a CT scan in August. A biopsy was considered but deferred at that time. Since then, the nodules have shown growth, prompting further evaluation. The CT scan from August revealed an atypical cavitary cyst in the left lower lobe measuring 17 by 21 mm. There is also a stable 6 mm nodule in the left lower lobe and a stable 8 mm nodule with a 5 mm solid component. Another nodule has increased in size from 7 by 17 mm to 10 by 21 mm.  A previous PET scan was performed, showing that a 1.5 cm nodule had an SUV of 2.1. Another small nodule showed low hypermetabolism, while a 5 mm nodule did not show hypermetabolism.  She experiences pain when bending over, described as 'something that's hurting you through there.' She is currently taking meloxicam   daily, but the pain persists. No use of blood thinners or aspirin, and no history of sleep apnea or diabetes. She has a known latex allergy. She will be scheduled for a bronchoscopy with biopsy of the left lower lobe cavitary lesion, and the  subpleural lingular nodule.     Test Results: CT Chest 11/24/2023  Atypical pulmonary cyst in the left lower lobe has increased to 17 x 21 mm with up to 4 mm spiculated wall thickening, highly suspicious for cavitary malignancy. Multidisciplinary thoracic consultation is recommended for further management. Although prior PET/CT (July 2025) showed no definite hypermetabolism, this does not exclude malignancy. 2. Subpleural lingular nodule has increased to 10 x 21 mm from 7 x 17 mm previously. 3. Multiple additional solid and peripheral nodules are stable, as described above, including Stable 6 mm solid nodule in the left lower lobe and Stable 8 mm part-solid nodule in the left lower lobe with a 5 mm solid component.  PET scan 08/10/2023 The 1.5 cm ground-glass and cystic nodule identified on recent chest CT shows only low level FDG uptake with SUV max = 2.1. As low-grade or well differentiated neoplasm can be poorly FDG avid, continued close follow-up warranted. 2. The new perifissural lingular nodule identified on the recent CT at 1.7 cm long axis is non hypermetabolic with SUV max = 1.9. Close attention on follow-up recommended. 3. 5 mm posterior right upper lobe nodule identified previously shows no hypermetabolism on PET imaging. Attention on follow-up. 4. 5 mm nodule identified anterior left upper lobe previously is not discernible on  CT imaging today and may have resolved. There is no hypermetabolism in this region on PET imaging. 5. No evidence for hypermetabolic metastatic disease in the neck, abdomen, or pelvis. 6.  Aortic Atherosclerosis (ICD10-I70.0).   07/20/2023 CT Chest The ground-glass nodule in the left lower lobe now  demonstrates more cystic change and has increased in size in the interim, measuring 1.5 x 1.3 x 1.5 cm. There is some new soft tissue nodularity and increasing surrounding ground-glass opacity associated with this nodule, which now may communicate with a an adjacent segmental bronchus. This is worrisome for underlying neoplasm. A follow-up PET-CT should be considered for further characterization. 2. Interval development of a nodular opacity abutting the inferior left fissure in the lingula, measuring 0.7 x 1.7 cm. A few additional scattered small nodules are new within both lungs measuring up to 5 mm. These could be infectious or inflammatory or metastatic in etiology.    Latest Ref Rng & Units 03/05/2023    8:08 AM 10/30/2022    8:35 AM 07/05/2022   10:10 AM  CBC  WBC 3.4 - 10.8 x10E3/uL 5.9  4.5  5.5   Hemoglobin 11.1 - 15.9 g/dL 87.3  88.9  88.5   Hematocrit 34.0 - 46.6 % 39.3  35.2  36.7   Platelets 150 - 450 x10E3/uL 272  247  229        Latest Ref Rng & Units 03/05/2023    8:08 AM 10/30/2022    8:35 AM 07/05/2022   10:10 AM  BMP  Glucose 70 - 99 mg/dL 88  79  79   BUN 8 - 27 mg/dL 8  6  8    Creatinine 0.57 - 1.00 mg/dL 9.18  9.19  9.10   BUN/Creat Ratio 12 - 28 10  8  9    Sodium 134 - 144 mmol/L 140  138  140   Potassium 3.5 - 5.2 mmol/L 4.6  4.7  4.7   Chloride 96 - 106 mmol/L 100  100  102   CO2 20 - 29 mmol/L 23  25  25    Calcium  8.7 - 10.3 mg/dL 9.2  9.1  9.2     BNP No results found for: BNP  ProBNP No results found for: PROBNP  PFT    Component Value Date/Time   FEV1PRE 1.67 10/05/2023 0808   FEV1POST 1.89 10/05/2023 0808   FVCPRE 2.05 10/05/2023 0808   FVCPOST 2.17 10/05/2023 0808   TLC 3.79 10/05/2023 0808   DLCOUNC 15.38 10/05/2023 0808   PREFEV1FVCRT 81 10/05/2023 0808   PSTFEV1FVCRT 87 10/05/2023 0808    CT CHEST WO CONTRAST Result Date: 11/26/2023 EXAM: CT CHEST WITHOUT CONTRAST 11/24/2023 01:20:49 PM TECHNIQUE: CT of the chest was  performed without the administration of intravenous contrast. Multiplanar reformatted images are provided for review. Automated exposure control, iterative reconstruction, and/or weight based adjustment of the mA/kV was utilized to reduce the radiation dose to as low as reasonably achievable. COMPARISON: Comparison made to 07/20/2023. CLINICAL HISTORY: Lung nodule, 6-7mm. FINDINGS: MEDIASTINUM: Global cardiac size within normal limits. No pericardial effusion. No significant coronary artery calcifications. Central pulmonary arteries are of normal caliber. Mild atherosclerotic calcification within the thoracic aorta. No aortic aneurysm. The central airways are clear. LYMPH NODES: No mediastinal, hilar or axillary lymphadenopathy. LUNGS AND PLEURA: Atypical pulmonary cysts within the left lower lobe (image 74 / 3) demonstrate increasing overall size, now measuring 17 x 21 mm, as well as thickening of the spiculated normal which now measures up to 4  mm in thickness. This is highly suspicious for a cavitary malignancy. Multidisciplinary thoracic consultation is recommended for further management. Though no definite hypermetabolism was identified on prior PET CT examination of 02/10/2023, lack of hypermetabolism would not completely exclude the likelihood of underlying malignancy. Stable 6 mm nodule within the left lower lobe (image 69 / 3). Stable 8 mm part solid nodule within the left lower lobe (image 57 / 3) demonstrating a 5 mm solid component. Subpleural nodule within the lingula measuring 10 x 21 mm demonstrates interval increase in size since prior examination, previously measuring 7 x 17 mm (image 78 / 3). Stable 6 mm nodule within the right upper lobe (image 43 / 3). Stable parenchymal scarring within the mid lung zones bilaterally. No pulmonary edema. No pleural effusion or pneumothorax. SOFT TISSUES/BONES: No acute abnormality of the bones or soft tissues. UPPER ABDOMEN: Limited images of the upper abdomen  demonstrates no acute abnormality. IMPRESSION: 1. Atypical pulmonary cyst in the left lower lobe has increased to 17 x 21 mm with up to 4 mm spiculated wall thickening, highly suspicious for cavitary malignancy. Multidisciplinary thoracic consultation is recommended for further management. Although prior PET/CT (July 2025) showed no definite hypermetabolism, this does not exclude malignancy. 2. Subpleural lingular nodule has increased to 10 x 21 mm from 7 x 17 mm previously. 3. Multiple additional solid and peripheral nodules are stable, as described above, including Stable 6 mm solid nodule in the left lower lobe and Stable 8 mm part-solid nodule in the left lower lobe with a 5 mm solid component. Electronically signed by: Dorethia Molt MD 11/26/2023 01:02 AM EST RP Workstation: [id number]     Past medical hx Past Medical History:  Diagnosis Date   Anemia    Anxiety    Arthritis    Asthma    Depression    GERD (gastroesophageal reflux disease)    Hepatitis-C    History of chemotherapy    Hypertension    Insomnia    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   Lymphoma (HCC) 12/2013   aggressive lymphoma   Personal history of non-Hodgkin lymphomas    Vitamin D  deficiency      Social History   Tobacco Use   Smoking status: Former    Current packs/day: 0.00    Average packs/day: 1.5 packs/day for 20.0 years (30.0 ttl pk-yrs)    Types: Cigarettes    Start date: 01/09/1970    Quit date: 01/09/1990    Years since quitting: 33.9    Passive exposure: Past   Smokeless tobacco: Never  Vaping Use   Vaping status: Never Used  Substance Use Topics   Alcohol use: No    Alcohol/week: 0.0 standard drinks of alcohol   Drug use: No    Ms.Murray-Miller reports that she quit smoking about 33 years ago. Her smoking use included cigarettes. She started smoking about 53 years ago. She has a 30 pack-year smoking history. She has been exposed to tobacco smoke. She has never used smokeless tobacco. She  reports that she does not drink alcohol and does not use drugs.  Tobacco Cessation: Counseling given: Not Answered Former smoker with a 30 pack year smoking history  Past surgical hx, Family hx, Social hx all reviewed.  Current Outpatient Medications on File Prior to Visit  Medication Sig   albuterol  (VENTOLIN  HFA) 108 (90 Base) MCG/ACT inhaler Inhale 1-2 puffs into the lungs every 6 (six) hours as needed for wheezing or shortness of breath.   atenolol  (TENORMIN )  25 MG tablet TAKE 1 TABLET (25 MG TOTAL) BY MOUTH DAILY.   b complex vitamins tablet Take 1 tablet by mouth daily.   busPIRone  (BUSPAR ) 10 MG tablet Take 1 tablet (10 mg total) by mouth 3 (three) times daily.   dimenhyDRINATE  (DRAMAMINE) 50 MG tablet Take 1 tablet (50 mg total) by mouth every 8 (eight) hours as needed.   fluticasone -salmeterol (WIXELA INHUB ) 500-50 MCG/ACT AEPB Inhale 1 puff into the lungs in the morning and at bedtime. Pt has been taking this for years without any issues,   gabapentin  (NEURONTIN ) 100 MG capsule Take 2 capsules (200 mg total) by mouth at bedtime. Follow written titration schedule.   hydrOXYzine  (ATARAX ) 50 MG tablet TAKE 1 TABLET BY MOUTH EVERY DAY AT BEDTIME AS NEEDED   ipratropium-albuterol  (DUONEB) 0.5-2.5 (3) MG/3ML SOLN INHALE 3 MLS VIA NEBULIZER AS DIRECTED   Lactobacillus Rhamnosus, GG, (CULTURELLE) CAPS Take 1 capsule by mouth daily.   latanoprost  (XALATAN ) 0.005 % ophthalmic solution Place 1 drop into both eyes at bedtime.   linaclotide  (LINZESS ) 290 MCG CAPS capsule Take 290 mcg by mouth daily before breakfast.   lisinopril  (ZESTRIL ) 5 MG tablet TAKE 1 TABLET (5 MG TOTAL) BY MOUTH DAILY.   Magnesium  Oxide -Mg Supplement 500 MG CAPS Take 1 capsule (500 mg total) by mouth in the morning and at bedtime.   meloxicam  (MOBIC ) 15 MG tablet TAKE 1 TABLET (15 MG TOTAL) BY MOUTH DAILY.   mirabegron  ER (MYRBETRIQ ) 25 MG TB24 tablet Take 1 tablet (25 mg total) by mouth daily.   montelukast   (SINGULAIR ) 10 MG tablet Take 1 tablet (10 mg total) by mouth at bedtime.   naloxone  (NARCAN ) nasal spray 4 mg/0.1 mL Place 1 spray into the nose as needed for up to 365 doses (for opioid-induced respiratory depresssion). In case of emergency (overdose), spray once into each nostril. If no response within 3 minutes, repeat application and call 911.   omeprazole  (PRILOSEC) 40 MG capsule TAKE 1 CAPSULE BY MOUTH EVERY DAY   ondansetron  (ZOFRAN ) 4 MG tablet Take 1 tablet (4 mg total) by mouth 2 (two) times daily.   paliperidone  (INVEGA ) 6 MG 24 hr tablet TAKE 1 TABLET (6 MG TOTAL) BY MOUTH EVERY MORNING.   polyethylene glycol (MIRALAX / GLYCOLAX) packet Take 17 g by mouth 2 (two) times daily.   prazosin (MINIPRESS) 1 MG capsule Take 2 mg by mouth at bedtime.   sertraline  (ZOLOFT ) 100 MG tablet Take 200 mg by mouth every morning.   traMADol  (ULTRAM ) 50 MG tablet Take 1 tablet (50 mg total) by mouth every 6 (six) hours as needed for severe pain (pain score 7-10). Each refill must last 30 days.   traZODone (DESYREL) 50 MG tablet 1 TABLET BY MOUTH AT BEDTIME   Wheat Dextrin (BENEFIBER) POWD Take 6 g by mouth 3 (three) times daily before meals. (2 tsp = 6 g)   No current facility-administered medications on file prior to visit.     Allergies  Allergen Reactions   Latex Hives   Penicillins Hives   Codeine Anxiety   Cortisone Swelling   Sulfa Antibiotics     Review Of Systems:  Constitutional:   No  weight loss, night sweats,  Fevers, chills, fatigue, or  lassitude.  HEENT:   No headaches,  Difficulty swallowing,  Tooth/dental problems, or  Sore throat,                No sneezing, itching, ear ache, nasal congestion, post nasal drip,  CV:  No chest pain,  Orthopnea, PND, swelling in lower extremities, anasarca, dizziness, palpitations, syncope.   GI  No heartburn, indigestion, abdominal pain, nausea, vomiting, diarrhea, change in bowel habits, loss of appetite, bloody stools.   Resp: No  shortness of breath with exertion or at rest.  No excess mucus, no productive cough,  No non-productive cough,  No coughing up of blood.  No change in color of mucus.  No wheezing.  No chest wall deformity  Skin: no rash or lesions.  GU: no dysuria, change in color of urine, no urgency or frequency.  No flank pain, no hematuria   MS:  No joint pain or swelling.  No decreased range of motion.  No back pain.  Psych:  No change in mood or affect. No depression or anxiety.  No memory loss.   Vital Signs BP 127/79   Pulse 95   Temp 98.4 F (36.9 C) (Oral)   Ht 5' 6 (1.676 m)   Wt 206 lb 12.8 oz (93.8 kg)   SpO2 98%   BMI 33.38 kg/m     Physical Exam GENERAL: No distress, alert and oriented times 3. EARS NOSE THROAT: No sinus tenderness, tympanic membranes clear, pale nasal mucosa, no oral exudate, no post nasal drip, no lymphadenopathy. CHEST: No wheeze, rales, dullness, no accessory muscle use, no nasal flaring, no sternal retractions. CARDIAC: S1, S2, regular rate and rhythm, no murmur. ABDOMINAL: Soft, non tender. ND, BS present, EXTREMITIES: No clubbing, cyanosis, edema. No obvious deformities NEUROLOGICAL: Normal strength. Alert and oriented x 3, MAE x 4 SKIN: No rashes, warm and dry. No obvious skin lesions PSYCHIATRIC: Normal mood and behavior.   Assessment/Plan Assessment & Plan  Multiple left lung nodules have increased in size.   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  Former smoker  - Scheduled biopsy of growing nodules with Dr. Shelah. - Needs family member to take her to procedure, stay during the procedure and  - Provided letter with procedure details and follow-up instructions. - Will follow up one week post-biopsy to discuss results.  AVS 12/11/2023 We have reviewed your CT Chest. The nodules we were worried about in August have grown. Plan is for a biopsy of these two left sided nodules so we know what these nodules are definitively. I  have placed an order for a bronchoscopy with biopsies.  We have discussed the procedure in detail.  We have reviewed the risks and benefits of the procedure. These include bleeding, infection, puncture of the lung, and adverse reaction to anesthesia. You have agreed to proceed with biopsy to evaluate the left lower lobe nodule. Your procedure will be done by Dr. Lamar Shelah. You will receive a letter today with date time and information pertaining to the procedure. You will need someone to drive you to the procedure, stay with you during the procedure, and stay with you after the procedure. You will also need someone to stay with you for 24 hours after anesthesia to ensure you have cleared and are doing well. You will follow-up with me 1 week after the procedure to review the results and to ensure you are doing well. Call if you need us  prior to the procedure or if you have any questions at all. Please contact office for sooner follow up if symptoms do not improve or worsen or seek emergency care     [facility] [address] [place] [address]   Dear [name],  The following has been scheduled for you:   Procedure: Bronchoscopy                 Surgeon:  Dr Shelah     Procedure date:  12/18/23 at 2:40 pm  Arrival Time:  12:10 pm Location: [facility] [address] Entrance [address] [place], [place] [address]   Additional Information: Do not eat anything after midnight - ok to take medications on the day of procedure with only sips of water You will need someone to drive you home after this procedure & to be with you for the next 24 hours You may receive a call from the hospital a day or two prior to procedure to go over additional instructions & medications  NEXT VISIT:  12/24/23 at 10:30 am With Lauraine Lites, NP at    [facility]    [address] [place], [place]  [address]       I spent 25 minutes dedicated to the  care of this patient on the date of this encounter to include pre-visit review of records, face-to-face time with the patient discussing conditions above, post visit ordering of testing, clinical documentation with the electronic health record, making appropriate referrals as documented, and communicating necessary information to the patient's healthcare team.     Lauraine JULIANNA Lites, NP 12/11/2023  8:33 AM

## 2023-12-11 NOTE — Telephone Encounter (Signed)
 Case # [id number] letter given by Wilford will send to [facility] to check to auth

## 2023-12-11 NOTE — Telephone Encounter (Signed)
 Please schedule the following:  Provider performing procedure: BYrum Diagnosis:  Cavitary lung nodule Which side if for nodule / mass?  Left, lingula Procedure:  Navigational bronchoscopy with biopsies  Has patient been spoken to by Provider and given informed consent?  Lauraine Lites NP on 12/11/2023 Anesthesia:  General Do you need Fluro?  Yes Duration of procedure:  1.5  Date:  12/18/2023 Alternate Date: 12/24/2023  Time:  Last case of the day Location:  [facility] Does patient have OSA? No DM? No Or Latex allergy?  Yes Medication Restriction/ Anticoagulate/Antiplatelet:  None Pre-op Labs Ordered:determined by Anesthesia Imaging request:  CT Chest done 11/24/2023  (If, SuperDimension CT Chest, please have STAT courier sent to ENDO)

## 2023-12-17 ENCOUNTER — Other Ambulatory Visit: Payer: Self-pay

## 2023-12-17 ENCOUNTER — Encounter (HOSPITAL_COMMUNITY): Payer: Self-pay | Admitting: Emergency Medicine

## 2023-12-17 NOTE — Progress Notes (Signed)
 Patient was called to be informed that the surgery time for tomorrow was changed to 11:00 o'clock. Patient was aware and she verbalized that she will be at the hospital at 08:30 o'clock.

## 2023-12-17 NOTE — Progress Notes (Signed)
 PCP - [facility] Cardiologist - none Pain Medicine - Dr Eric Como Pulmonology - Lauraine Lites, NP  CT Chest x-ray - 11/24/23 EKG - DOS Stress Test - n/a ECHO - 01/15/14 Cardiac Cath - n/a  ICD Pacemaker/Loop - n/a  Sleep Study -  n/a  Diabetes - n/a  Aspirin & Blood Thinner Instructions:  n/a  NPO  Anesthesia review: Yes  STOP now taking any Aspirin (unless otherwise instructed by your surgeon), Aleve, Naproxen, Ibuprofen, Mobic , Motrin, Advil, Goody's, BC's, all herbal medications, fish oil, and all vitamins.   Coronavirus Screening Do you have any of the following symptoms:  Cough yes/no: No Fever (>100.8F)  yes/no: No Runny nose yes/no: No Sore throat yes/no: No Difficulty breathing/shortness of breath  yes/no: No  Have you traveled in the last 14 days and where? yes/no: No  Patient verbalized understanding of instructions that were given via phone.

## 2023-12-18 ENCOUNTER — Ambulatory Visit (HOSPITAL_COMMUNITY)
Admission: RE | Admit: 2023-12-18 | Discharge: 2023-12-18 | Disposition: A | Attending: Emergency Medicine | Admitting: Emergency Medicine

## 2023-12-18 ENCOUNTER — Ambulatory Visit (HOSPITAL_COMMUNITY)

## 2023-12-18 ENCOUNTER — Other Ambulatory Visit: Payer: Self-pay

## 2023-12-18 ENCOUNTER — Encounter (HOSPITAL_COMMUNITY): Admission: RE | Disposition: A | Payer: Self-pay | Attending: Emergency Medicine

## 2023-12-18 ENCOUNTER — Encounter (HOSPITAL_COMMUNITY): Payer: Self-pay | Admitting: Emergency Medicine

## 2023-12-18 ENCOUNTER — Ambulatory Visit (HOSPITAL_COMMUNITY): Admitting: Physician Assistant

## 2023-12-18 DIAGNOSIS — Z91040 Latex allergy status: Secondary | ICD-10-CM | POA: Diagnosis not present

## 2023-12-18 DIAGNOSIS — I10 Essential (primary) hypertension: Secondary | ICD-10-CM | POA: Diagnosis not present

## 2023-12-18 DIAGNOSIS — R911 Solitary pulmonary nodule: Secondary | ICD-10-CM | POA: Diagnosis present

## 2023-12-18 DIAGNOSIS — Z6833 Body mass index (BMI) 33.0-33.9, adult: Secondary | ICD-10-CM | POA: Diagnosis not present

## 2023-12-18 DIAGNOSIS — M797 Fibromyalgia: Secondary | ICD-10-CM

## 2023-12-18 DIAGNOSIS — R918 Other nonspecific abnormal finding of lung field: Secondary | ICD-10-CM | POA: Diagnosis present

## 2023-12-18 DIAGNOSIS — E669 Obesity, unspecified: Secondary | ICD-10-CM | POA: Diagnosis not present

## 2023-12-18 DIAGNOSIS — Z791 Long term (current) use of non-steroidal anti-inflammatories (NSAID): Secondary | ICD-10-CM | POA: Diagnosis not present

## 2023-12-18 HISTORY — DX: Personal history of urinary calculi: Z87.442

## 2023-12-18 HISTORY — DX: Pneumonia, unspecified organism: J18.9

## 2023-12-18 HISTORY — DX: Stress incontinence (female) (male): N39.3

## 2023-12-18 HISTORY — DX: Dyspnea, unspecified: R06.00

## 2023-12-18 HISTORY — DX: Other amnesia: R41.3

## 2023-12-18 HISTORY — DX: Post-traumatic stress disorder, unspecified: F43.10

## 2023-12-18 HISTORY — PX: VIDEO BRONCHOSCOPY WITH ENDOBRONCHIAL NAVIGATION: SHX6175

## 2023-12-18 LAB — COMPREHENSIVE METABOLIC PANEL WITH GFR
ALT: 14 U/L (ref 0–44)
AST: 20 U/L (ref 15–41)
Albumin: 3.3 g/dL — ABNORMAL LOW (ref 3.5–5.0)
Alkaline Phosphatase: 75 U/L (ref 38–126)
Anion gap: 17 — ABNORMAL HIGH (ref 5–15)
BUN: 10 mg/dL (ref 8–23)
CO2: 16 mmol/L — ABNORMAL LOW (ref 22–32)
Calcium: 8.6 mg/dL — ABNORMAL LOW (ref 8.9–10.3)
Chloride: 105 mmol/L (ref 98–111)
Creatinine, Ser: 0.85 mg/dL (ref 0.44–1.00)
GFR, Estimated: >60 mL/min (ref >60)
Glucose, Bld: 87 mg/dL (ref 70–99)
Potassium: 4.1 mmol/L (ref 3.5–5.1)
Sodium: 138 mmol/L (ref 135–145)
Total Bilirubin: 0.7 mg/dL (ref 0.0–1.2)
Total Protein: 7.2 g/dL (ref 6.5–8.1)

## 2023-12-18 LAB — CBC
HCT: 37 % (ref 36.0–46.0)
Hemoglobin: 11.3 g/dL — ABNORMAL LOW (ref 12.0–15.0)
MCH: 25 pg — ABNORMAL LOW (ref 26.0–34.0)
MCHC: 30.5 g/dL (ref 30.0–36.0)
MCV: 81.9 fL (ref 80.0–100.0)
Platelets: 195 K/uL (ref 150–400)
RBC: 4.52 MIL/uL (ref 3.87–5.11)
RDW: 13.7 % (ref 11.5–15.5)
WBC: 4.6 K/uL (ref 4.0–10.5)
nRBC: 0 % (ref 0.0–0.2)

## 2023-12-18 SURGERY — VIDEO BRONCHOSCOPY WITH ENDOBRONCHIAL NAVIGATION
Anesthesia: General | Laterality: Left

## 2023-12-18 MED ORDER — DEXAMETHASONE SOD PHOSPHATE PF 10 MG/ML IJ SOLN
INTRAMUSCULAR | Status: DC | PRN
  Administered 2023-12-18: 10 mg via INTRAVENOUS

## 2023-12-18 MED ORDER — ALBUTEROL SULFATE HFA 108 (90 BASE) MCG/ACT IN AERS
INHALATION_SPRAY | RESPIRATORY_TRACT | Status: DC | PRN
  Administered 2023-12-18: 4 via RESPIRATORY_TRACT

## 2023-12-18 MED ORDER — LACTATED RINGERS IV SOLN: INTRAVENOUS | Status: DC

## 2023-12-18 MED ORDER — PROPOFOL 10 MG/ML IV BOLUS
INTRAVENOUS | Status: DC | PRN
  Administered 2023-12-18: 150 ug/kg/min via INTRAVENOUS
  Administered 2023-12-18: 100 mg via INTRAVENOUS
  Administered 2023-12-18: 125 ug/kg/min via INTRAVENOUS
  Administered 2023-12-18: 50 mg via INTRAVENOUS

## 2023-12-18 MED ORDER — SUGAMMADEX SODIUM 200 MG/2ML IV SOLN
INTRAVENOUS | Status: DC | PRN
  Administered 2023-12-18: 200 mg via INTRAVENOUS

## 2023-12-18 MED ORDER — CHLORHEXIDINE GLUCONATE 0.12 % MT SOLN
OROMUCOSAL | Status: AC
  Administered 2023-12-18: 15 mL via OROMUCOSAL
  Filled 2023-12-18: qty 15

## 2023-12-18 MED ORDER — FENTANYL CITRATE (PF) 100 MCG/2ML IJ SOLN: 25.0000 ug | INTRAMUSCULAR | Status: DC | PRN

## 2023-12-18 MED ORDER — ONDANSETRON HCL 4 MG/2ML IJ SOLN
INTRAMUSCULAR | Status: DC | PRN
  Administered 2023-12-18: 4 mg via INTRAVENOUS

## 2023-12-18 MED ORDER — CHLORHEXIDINE GLUCONATE 0.12 % MT SOLN: 15.0000 mL | Freq: Once | OROMUCOSAL | Status: AC

## 2023-12-18 MED ORDER — PHENYLEPHRINE HCL-NACL 20-0.9 MG/250ML-% IV SOLN
INTRAVENOUS | Status: DC | PRN
  Administered 2023-12-18: 10 ug/min via INTRAVENOUS

## 2023-12-18 MED ORDER — LIDOCAINE 2% (20 MG/ML) 5 ML SYRINGE
INTRAMUSCULAR | Status: DC | PRN
  Administered 2023-12-18: 60 mg via INTRAVENOUS

## 2023-12-18 MED ORDER — ROCURONIUM BROMIDE 10 MG/ML (PF) SYRINGE
PREFILLED_SYRINGE | INTRAVENOUS | Status: DC | PRN
  Administered 2023-12-18: 70 mg via INTRAVENOUS

## 2023-12-18 SURGICAL SUPPLY — 36 items
ADAPTER BRONCHOSCOPE OLYMPUS (ADAPTER) ×1 IMPLANT
ADAPTER VALVE BIOPSY EBUS (MISCELLANEOUS) IMPLANT
BAG COUNTER SPONGE SURGICOUNT (BAG) ×1 IMPLANT
BRUSH CYTOL CELLEBRITY 1.5X140 (MISCELLANEOUS) ×1 IMPLANT
BRUSH SUPERTRAX BIOPSY (INSTRUMENTS) IMPLANT
BRUSH SUPERTRAX NDL-TIP CYTO (INSTRUMENTS) ×1 IMPLANT
CANISTER SUCTION 3000ML PPV (SUCTIONS) ×1 IMPLANT
CNTNR URN SCR LID CUP LEK RST (MISCELLANEOUS) ×1 IMPLANT
COVER BACK TABLE 60X90IN (DRAPES) ×1 IMPLANT
FILTER STRAW FLUID ASPIR (MISCELLANEOUS) IMPLANT
FORCEPS BIOP 1.5 SINGLE USE (MISCELLANEOUS) ×1 IMPLANT
FORCEPS BIOP SUPERTRX PREMAR (INSTRUMENTS) ×1 IMPLANT
GAUZE SPONGE 4X4 12PLY STRL (GAUZE/BANDAGES/DRESSINGS) ×1 IMPLANT
GLOVE BIO SURGEON STRL SZ7.5 (GLOVE) ×2 IMPLANT
GOWN STRL REUS W/ TWL LRG LVL3 (GOWN DISPOSABLE) ×2 IMPLANT
KIT CLEAN ENDO COMPLIANCE (KITS) ×1 IMPLANT
KIT LOCATABLE GUIDE (CANNULA) IMPLANT
KIT MARKER FIDUCIAL DELIVERY (KITS) IMPLANT
KIT TURNOVER KIT B (KITS) ×1 IMPLANT
MARKER SKIN DUAL TIP RULER LAB (MISCELLANEOUS) ×1 IMPLANT
NDL SUPERTRX PREMARK BIOPSY (NEEDLE) ×1 IMPLANT
NEEDLE SUPERTRX PREMARK BIOPSY (NEEDLE) ×1 IMPLANT
OIL SILICONE PENTAX (PARTS (SERVICE/REPAIRS)) ×1 IMPLANT
PAD ARMBOARD POSITIONER FOAM (MISCELLANEOUS) ×2 IMPLANT
PATCHES PATIENT (LABEL) ×3 IMPLANT
SOLN 0.9% NACL POUR BTL 1000ML (IV SOLUTION) ×1 IMPLANT
SOLN STERILE WATER BTL 1000 ML (IV SOLUTION) ×1 IMPLANT
SYR 20ML ECCENTRIC (SYRINGE) ×1 IMPLANT
SYR 20ML LL LF (SYRINGE) ×1 IMPLANT
SYR 50ML SLIP (SYRINGE) ×1 IMPLANT
TOWEL GREEN STERILE FF (TOWEL DISPOSABLE) ×1 IMPLANT
TRAP SPECIMEN MUCUS 40CC (MISCELLANEOUS) IMPLANT
TUBE CONNECTING 20X1/4 (TUBING) ×1 IMPLANT
UNDERPAD 30X36 HEAVY ABSORB (UNDERPADS AND DIAPERS) ×1 IMPLANT
VALVE BIOPSY SINGLE USE (MISCELLANEOUS) ×1 IMPLANT
VALVE SUCTION BRONCHIO DISP (MISCELLANEOUS) ×1 IMPLANT

## 2023-12-18 NOTE — Anesthesia Procedure Notes (Signed)
 Procedure Name: Intubation Date/Time: 12/18/2023 11:30 AM  Performed by: Hedy Jarred, CRNAPre-anesthesia Checklist: Patient identified, Emergency Drugs available, Suction available and Patient being monitored Patient Re-evaluated:Patient Re-evaluated prior to induction Oxygen  Delivery Method: Circle System Utilized Preoxygenation: Pre-oxygenation with 100% oxygen  Induction Type: IV induction Ventilation: Mask ventilation without difficulty Laryngoscope Size: Mac and 3 Grade View: Grade I Tube type: Oral Tube size: 8.5 mm Number of attempts: 1 Airway Equipment and Method: Stylet and Oral airway Placement Confirmation: ETT inserted through vocal cords under direct vision, positive ETCO2 and breath sounds checked- equal and bilateral Secured at: 21 cm Tube secured with: Tape Dental Injury: Teeth and Oropharynx as per pre-operative assessment

## 2023-12-18 NOTE — Op Note (Signed)
 Video Bronchoscopy with Robotic Assisted Bronchoscopic Navigation   Date of Operation: 12/18/2023   Pre-op Diagnosis: Bilateral mixed density/cavitary pulmonary nodules  Post-op Diagnosis: Same  Surgeon: Lamar Chris  Assistants: None  Anesthesia: General endotracheal anesthesia  Operation: Flexible video fiberoptic bronchoscopy with robotic assistance and biopsies.  Estimated Blood Loss: Minimal  Complications: None  Indications and History: Rachael West is a 68 y.o. female with history of former tobacco use with asthma, history of large B-cell lymphoma.  She was found to have multiple mixed density pulmonary nodules on CT scan of the chest, at least 1 of which was cavitary.  Recommendation made to achieve a tissue diagnosis via robotic assisted navigational bronchoscopy.  The risks, benefits, complications, treatment options and expected outcomes were discussed with the patient.  The possibilities of pneumothorax, pneumonia, reaction to medication, pulmonary aspiration, perforation of a viscus, bleeding, failure to diagnose a condition and creating a complication requiring transfusion or operation were discussed with the patient who freely signed the consent.    Description of Procedure: The patient was seen in the Preoperative Area, was examined and was deemed appropriate to proceed.  The patient was taken to [facility] Endoscopy room 3, identified as [name] and the procedure verified as Flexible Video Fiberoptic Bronchoscopy.  A Time Out was held and the above information confirmed.   Prior to the date of the procedure a high-resolution CT scan of the chest was performed. Utilizing ION software program a virtual tracheobronchial tree was generated to allow the creation of distinct navigation pathways to the patient's parenchymal abnormalities. After being taken to the operating room general anesthesia was initiated and the patient  was orally intubated. The video  fiberoptic bronchoscope was introduced via the endotracheal tube and a general inspection was performed which showed normal right and left lung anatomy. Aspiration of the bilateral mainstems was completed to remove any remaining secretions. Robotic catheter inserted into patient's endotracheal tube.   Target #1 left lower lobe cavitary pulmonary nodule: The distinct navigation pathways prepared prior to this procedure were then utilized to navigate to patient's lesion identified on CT scan. The robotic catheter was secured into place and the vision probe was withdrawn.  Lesion location was approximated using fluoroscopy.  Local registration and targeting was performed using [employer] Cios mobile C-arm three-dimensional imaging. Under fluoroscopic guidance transbronchial needle brushings, transbronchial needle biopsies, and transbronchial cryoprobe biopsies were performed to be sent for cytology and pathology.  A single needle sample was sent for microbiology.  Needle-in-lesion was confirmed using Cios mobile C-arm.    Target #2 left upper lobe pleural-based nodular opacity: The distinct navigation pathways prepared prior to this procedure were then utilized to navigate to patient's lesion identified on CT scan. The robotic catheter was secured into place and the vision probe was withdrawn.  Lesion location was approximated using fluoroscopy.  Local registration and targeting was performed using [employer] Cios mobile C-arm three-dimensional imaging. Under fluoroscopic guidance transbronchial needle biopsies and transbronchial forceps biopsies were performed to be sent for cytology and pathology.  Needle-in-lesion was confirmed using Cios mobile C-arm.     At the end of the procedure a general airway inspection was performed and there was no evidence of active bleeding. The bronchoscope was removed.  The patient tolerated the procedure well. There was no significant blood loss and there  were no obvious complications. A post-procedural chest x-ray is pending.  Samples Target #1: 1. Transbronchial needle brushings from left lower lobe cavitary lesion  2. Transbronchial Wang needle biopsies from left lower lobe cavitary lesion 3. Transbronchial cryoprobe biopsies from left lower lobe cavitary lesion  Samples Target #2: 1. Transbronchial Wang needle biopsies from left upper lobe pleural-based nodular opacity 2. Transbronchial cryoprobe biopsies from left upper lobe pleural-based nodular opacity  Plans:  The patient will be discharged from the PACU to home when recovered from anesthesia and after chest x-ray is reviewed. We will review the cytology, pathology and microbiology results with the patient when they become available. Outpatient followup will be with CANDIE Lites, NP and Dr Theophilus.    Lamar Chris, MD, PhD 12/18/2023, 12:16 PM [facility] [phone] or if no answer before 7:00PM call [phone] For any issues after 7:00PM please call eLink [phone]

## 2023-12-18 NOTE — Op Note (Signed)
 Procedure Note  Patient: Rachael West [facility]  [employer] Cios mobile C-arm was utilized to identify and biopsy left lower lobe cavitary nodule and left upper lobe pleural-based nodular opacity.  Needle-in-lesion was confirmed using real-time Cios imaging, and images were uploaded to PACS.    Left lower lobe cavitary nodule    Left upper lobe pleural-based nodular opacity   Lamar Chris, MD, PhD 12/18/2023, 12:29 PM [facility] [phone] or if no answer before 7:00PM call [phone] For any issues after 7:00PM please call eLink [phone]

## 2023-12-18 NOTE — Interval H&P Note (Signed)
 History and Physical Interval Note:  12/18/2023 10:41 AM  Rachael West  has presented today for surgery, with the diagnosis of cavitary lung nodule.  The various methods of treatment have been discussed with the patient and family. After consideration of risks, benefits and other options for treatment, the patient has consented to  Procedure(s): VIDEO BRONCHOSCOPY WITH ENDOBRONCHIAL NAVIGATION (Left) as a surgical intervention.  The patient's history has been reviewed, patient examined, no change in status, stable for surgery.  I have reviewed the patient's chart and labs.  Questions were answered to the patient's satisfaction.     Lamar GORMAN Chris

## 2023-12-18 NOTE — Discharge Instructions (Addendum)

## 2023-12-18 NOTE — Transfer of Care (Signed)
 Immediate Anesthesia Transfer of Care Note  Patient: Rachael West  Procedure(s) Performed: VIDEO BRONCHOSCOPY WITH ENDOBRONCHIAL NAVIGATION (Left)  Patient Location: PACU  Anesthesia Type:General  Level of Consciousness: awake, alert , and oriented  Airway & Oxygen  Therapy: Patient Spontanous Breathing and Patient connected to face mask oxygen   Post-op Assessment: Report given to RN and Post -op Vital signs reviewed and stable  Post vital signs: Reviewed and stable  Last Vitals:  Vitals Value Taken Time  BP 144/73 12/18/23 12:32  Temp 36.6 C 12/18/23 12:34  Pulse 89 12/18/23 12:36  Resp 15 12/18/23 12:37  SpO2 97 % 12/18/23 12:36  Vitals shown include unfiled device data.  Last Pain:  Vitals:   12/18/23 1234  TempSrc:   PainSc: 0-No pain         Complications: No notable events documented.

## 2023-12-18 NOTE — Anesthesia Preprocedure Evaluation (Signed)
 Anesthesia Evaluation  Patient identified by MRN, date of birth, ID band Patient awake    Reviewed: Allergy & Precautions, NPO status , Patient's Chart, lab work & pertinent test results  History of Anesthesia Complications Negative for: history of anesthetic complications  Airway Mallampati: II  TM Distance: >3 FB Neck ROM: Full    Dental  (+) Dental Advisory Given, Edentulous Upper, Missing,    Pulmonary shortness of breath, asthma , neg sleep apnea, neg COPD, neg recent URI, former smoker   breath sounds clear to auscultation       Cardiovascular hypertension, Pt. on medications and Pt. on home beta blockers (-) angina (-) Past MI and (-) CHF  Rhythm:Regular  Left ventricle: Normal left ventricular strain parameters. The    cavity size was normal. Systolic function was vigorous. The    estimated ejection fraction was in the range of 65% to 70%. Wall    motion was normal; there were no regional wall motion    abnormalities. There was no evidence of elevated ventricular    filling pressure by Doppler parameters.  - Aortic valve: Trileaflet; normal thickness leaflets. There was    trivial regurgitation.  - Aortic root: The aortic root was normal in size.  - Mitral valve: There was no regurgitation.  - Left atrium: The atrium was normal in size.  - Right ventricle: The cavity size was mildly dilated. Wall    thickness was normal. Systolic function was normal.  - Right atrium: The atrium was mildly dilated.  - Tricuspid valve: There was mild regurgitation.  - Pulmonic valve: There was no regurgitation.  - Pulmonary arteries: Systolic pressure was within the normal    range. PA peak pressure: 40 mm Hg (S).  - Inferior vena cava: The vessel was normal in size.     Neuro/Psych  Headaches PSYCHIATRIC DISORDERS  Depression     Neuromuscular disease    GI/Hepatic ,GERD  ,,(+) Hepatitis -, C, Epstein-BarrTreated   Endo/Other   negative endocrine ROS    Renal/GU negative Renal ROS     Musculoskeletal  (+) Arthritis ,  Fibromyalgia -  Abdominal   Peds  Hematology  (+) Blood dyscrasia, anemia Lab Results      Component                Value               Date                      WBC                      4.6                 12/18/2023                HGB                      11.3 (L)            12/18/2023                HCT                      37.0                12/18/2023                MCV  81.9                12/18/2023                PLT                      195                 12/18/2023              Anesthesia Other Findings   Reproductive/Obstetrics                              Anesthesia Physical Anesthesia Plan  ASA: 3  Anesthesia Plan: General   Post-op Pain Management: Minimal or no pain anticipated   Induction: Intravenous  PONV Risk Score and Plan: 3 and Propofol  infusion, Ondansetron  and Treatment may vary due to age or medical condition  Airway Management Planned: Oral ETT  Additional Equipment:   Intra-op Plan:   Post-operative Plan: Extubation in OR  Informed Consent: I have reviewed the patients History and Physical, chart, labs and discussed the procedure including the risks, benefits and alternatives for the proposed anesthesia with the patient or authorized representative who has indicated his/her understanding and acceptance.     Dental advisory given  Plan Discussed with: CRNA  Anesthesia Plan Comments:          Anesthesia Quick Evaluation

## 2023-12-19 LAB — CYTOLOGY - NON PAP

## 2023-12-20 LAB — ACID FAST SMEAR (AFB, MYCOBACTERIA): Acid Fast Smear: NEGATIVE

## 2023-12-20 NOTE — Anesthesia Postprocedure Evaluation (Signed)
 Anesthesia Post Note  Patient: Rachael West  Procedure(s) Performed: VIDEO BRONCHOSCOPY WITH ENDOBRONCHIAL NAVIGATION (Left)     Patient location during evaluation: PACU Anesthesia Type: General Level of consciousness: awake and alert Pain management: pain level controlled Vital Signs Assessment: post-procedure vital signs reviewed and stable Respiratory status: spontaneous breathing, nonlabored ventilation and respiratory function stable Cardiovascular status: blood pressure returned to baseline and stable Postop Assessment: no apparent nausea or vomiting Anesthetic complications: no   No notable events documented.                 Krystyn Picking

## 2023-12-21 ENCOUNTER — Encounter (HOSPITAL_COMMUNITY): Payer: Self-pay | Admitting: Emergency Medicine

## 2023-12-21 LAB — FUNGUS STAIN

## 2023-12-21 LAB — FUNGAL STAIN REFLEX

## 2023-12-23 LAB — AEROBIC/ANAEROBIC CULTURE W GRAM STAIN (SURGICAL/DEEP WOUND)
Culture: NORMAL
Gram Stain: NONE SEEN

## 2023-12-24 ENCOUNTER — Ambulatory Visit: Admitting: Acute Care

## 2024-01-06 ENCOUNTER — Other Ambulatory Visit: Payer: Self-pay | Admitting: Acute Care

## 2024-01-06 DIAGNOSIS — J4520 Mild intermittent asthma, uncomplicated: Secondary | ICD-10-CM

## 2024-01-07 ENCOUNTER — Other Ambulatory Visit

## 2024-01-07 ENCOUNTER — Other Ambulatory Visit: Payer: Self-pay

## 2024-01-07 DIAGNOSIS — I10 Essential (primary) hypertension: Secondary | ICD-10-CM

## 2024-01-07 DIAGNOSIS — E559 Vitamin D deficiency, unspecified: Secondary | ICD-10-CM

## 2024-01-07 DIAGNOSIS — E785 Hyperlipidemia, unspecified: Secondary | ICD-10-CM

## 2024-01-14 ENCOUNTER — Ambulatory Visit

## 2024-01-15 ENCOUNTER — Encounter: Payer: Self-pay | Admitting: Podiatry

## 2024-01-15 ENCOUNTER — Encounter: Payer: Self-pay | Admitting: Acute Care

## 2024-01-15 ENCOUNTER — Ambulatory Visit (INDEPENDENT_AMBULATORY_CARE_PROVIDER_SITE_OTHER): Admitting: Podiatry

## 2024-01-15 ENCOUNTER — Ambulatory Visit (INDEPENDENT_AMBULATORY_CARE_PROVIDER_SITE_OTHER)

## 2024-01-15 ENCOUNTER — Ambulatory Visit (INDEPENDENT_AMBULATORY_CARE_PROVIDER_SITE_OTHER): Admitting: Acute Care

## 2024-01-15 VITALS — BP 122/76 | HR 85 | Temp 98.5°F | Ht 66.0 in | Wt 205.0 lb

## 2024-01-15 DIAGNOSIS — J984 Other disorders of lung: Secondary | ICD-10-CM | POA: Diagnosis not present

## 2024-01-15 DIAGNOSIS — Z0000 Encounter for general adult medical examination without abnormal findings: Secondary | ICD-10-CM

## 2024-01-15 DIAGNOSIS — D2371 Other benign neoplasm of skin of right lower limb, including hip: Secondary | ICD-10-CM

## 2024-01-15 DIAGNOSIS — Z23 Encounter for immunization: Secondary | ICD-10-CM

## 2024-01-15 DIAGNOSIS — J45901 Unspecified asthma with (acute) exacerbation: Secondary | ICD-10-CM | POA: Diagnosis not present

## 2024-01-15 DIAGNOSIS — Z9889 Other specified postprocedural states: Secondary | ICD-10-CM

## 2024-01-15 DIAGNOSIS — M2041 Other hammer toe(s) (acquired), right foot: Secondary | ICD-10-CM

## 2024-01-15 DIAGNOSIS — M65972 Unspecified synovitis and tenosynovitis, left ankle and foot: Secondary | ICD-10-CM

## 2024-01-15 DIAGNOSIS — J4520 Mild intermittent asthma, uncomplicated: Secondary | ICD-10-CM

## 2024-01-15 DIAGNOSIS — Z87891 Personal history of nicotine dependence: Secondary | ICD-10-CM

## 2024-01-15 DIAGNOSIS — R9389 Abnormal findings on diagnostic imaging of other specified body structures: Secondary | ICD-10-CM

## 2024-01-15 DIAGNOSIS — R911 Solitary pulmonary nodule: Secondary | ICD-10-CM

## 2024-01-15 LAB — NITRIC OXIDE: Nitric Oxide: 41

## 2024-01-15 MED ORDER — PREDNISONE 10 MG PO TABS: ORAL_TABLET | ORAL | 0 refills | Status: AC

## 2024-01-15 MED ORDER — PREDNISONE 10 MG PO TABS: 10.0000 mg | ORAL_TABLET | Freq: Every day | ORAL | 0 refills | Status: DC

## 2024-01-15 MED ORDER — TRIAMCINOLONE ACETONIDE 40 MG/ML IJ SUSP
20.0000 mg | Freq: Once | INTRAMUSCULAR | Status: AC
  Administered 2024-01-15: 20 mg

## 2024-01-15 NOTE — Patient Instructions (Addendum)
 It is good to see you today. I am glad you have done well after the procedure.  We have reviewed your biopsy results. They were negative for malignancy, which is reassuring.  We will do a short term follow up CT Chest in 3 months. This will be due 03/2024. You will get a call closer to the time to get this scheduled.  You will follow up with me 1-2 weeks after the scan to review results. We will do a FENO to see if you are having an asthma flare.  Please resume your Wixella inhaler. Do 1 puff in the morning and one puff in the evening. Do this every day without fail.  Whether you feel good or bad, you use this every day. This is your maintenance inhaler. Use albuterol  1-2 puffs as needed for shortness of breath or wheezing  This is your rescue inhaler. You can also use your DuoNebs either twice daily, or as needed.  Use as needed but if you need to use this more than 4 times a day, call to be seen.  Call for any unexplained weight loss or blood when you cough.  Flu vaccine today Please contact office for sooner follow up if symptoms do not improve or worsen or seek emergency care

## 2024-01-15 NOTE — Progress Notes (Signed)
 "  History of Present Illness Rachael West is a 69 y.o. female  former smoker followed by Dr. Theophilus for asthma and referred to Dr. Shelah for evaluation of pulmonary nodules found on Imaging.   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    01/15/2024 Discussed the use of AI scribe software for clinical note transcription with the patient, who gave verbal consent to proceed.  History of Present Illness Rachael West is a 69 year old female with asthma who presents post bronchoscopy to review biopsy results and ensure she is doing well after the procedure.    She states she has been having a flare of her asthma since the bronch 12/18/2023. She has not been using her Wixella at all, nor has she been using her DuoNebs.In the review of symptoms, she denies any bleeding, fever, or adverse reaction to anesthesia.  We have reviewed her biopsy results. They were negative for malignancy. I explained that we will need to do a short term follow up scan to maintain surveillance of the left lower lobe nodule of concern.We will do this in March, which will be 3 months after the scan.She is in agreement with this plan. Cultures obtained during the procedure for fungal, acid-fast bacillus, aerobes, and anaerobes were negative.  She states  has been experiencing worsening shortness of breath since December 9th. Initially, she thought it was a cold.  She has been using rescue inhaler her inhaler more frequently, about three to four times a day, whereas she typically can skip a day. She also has a dry cough but no secretions, fever, or discolored secretions.  She has not been consistently using her maintenance inhaler, Wixela, which is supposed to  be taken as one puff in the morning and one puff at bedtime daily. She has six refills available at the pharmacy. She has used the DuoNeb nebulizer treatment once during this flare.  We have reinforced the importance of using her maintenance inhaler every day, regardless of whether she feels good or bad. FENO done today was 41 PPB. We will do a pred taper and ensure she starts her Wixella daily to ensure she is better. She was asked to call if she does not get better. She verbalized understanding.     Test Results: Cytology 12/18/2023 A.  LEFT LUNG, LOWER LOBE, TARGET 1, FINE NEEDLE ASPIRATION  BIOPSY:  - Negative for malignancy  - Benign bronchial cells, pulmonary macrophages and mixed inflammatory  cells  - Benign bronchial mucosa (cellblock)   B.  LEFT LUNG, LOWER LOBE, TARGET 1, BRUSHING:  - Negative for malignancy  - Numerous benign bronchial cells with scattered acute inflammatory  cells   D. LUNG, LUL, FINE NEEDLE ASPIRATION  BIOPSY:  - Negative for malignancy  - Compatible with a benign lymph node  - Scattered benign bronchial cells and pulmonary   Micro>> Fungal negative AFB >> Negative No Anaerobes, rare normal flora  Lab Results  Component Value Date   NITRICOXIDE 41 01/15/2024    Lab Results  Component Value Date   NITRICOXIDE 41 01/15/2024   No results found for: FENOLVLPPB This result suggests intermediate (25-49) Type 2 (T2) airway inflammation; clinical correlation required.      Latest Ref Rng & Units 12/18/2023    9:01 AM 03/05/2023    8:08 AM 10/30/2022  8:35 AM  CBC  WBC 4.0 - 10.5 K/uL 4.6  5.9  4.5   Hemoglobin 12.0 - 15.0 g/dL 88.6  87.3  88.9   Hematocrit 36.0 - 46.0 % 37.0  39.3  35.2   Platelets 150 - 400 K/uL 195  272  247        Latest Ref Rng & Units 12/18/2023    9:02 AM 03/05/2023    8:08 AM 10/30/2022    8:35 AM  BMP  Glucose 70 - 99 mg/dL 87  88  79   BUN 8 - 23 mg/dL 10  8  6    Creatinine 0.44 - 1.00 mg/dL 9.14  9.18  9.19    BUN/Creat Ratio 12 - 28  10  8    Sodium 135 - 145 mmol/L 138  140  138   Potassium 3.5 - 5.1 mmol/L 4.1  4.6  4.7   Chloride 98 - 111 mmol/L 105  100  100   CO2 22 - 32 mmol/L 16  23  25    Calcium  8.9 - 10.3 mg/dL 8.6  9.2  9.1     BNP No results found for: BNP  ProBNP No results found for: PROBNP  PFT    Component Value Date/Time   FEV1PRE 1.67 10/05/2023 0808   FEV1POST 1.89 10/05/2023 0808   FVCPRE 2.05 10/05/2023 0808   FVCPOST 2.17 10/05/2023 0808   TLC 3.79 10/05/2023 0808   DLCOUNC 15.38 10/05/2023 0808   PREFEV1FVCRT 81 10/05/2023 0808   PSTFEV1FVCRT 87 10/05/2023 0808    DG Chest Port 1 View Result Date: 12/18/2023 CLINICAL DATA:  Status post bronchoscopy with biopsy EXAM: PORTABLE CHEST 1 VIEW COMPARISON:  Prior chest CT 11/24/2023 FINDINGS: Low lung volumes with linear atelectasis versus scarring in the lingula and right middle lobe. Borderline cardiomegaly. No evidence of pneumothorax or large pleural effusion. No acute osseous abnormality. IMPRESSION: 1. Relatively low lung volumes without evidence of pneumothorax or new pleural effusion. 2. Areas of linear atelectasis versus scarring in the lingula and right middle lobe appear exaggerated given the relatively low lung volumes. Electronically Signed   By: Wilkie Lent M.D.   On: 12/18/2023 13:36   DG C-ARM BRONCHOSCOPY Result Date: 12/18/2023 C-ARM BRONCHOSCOPY: Fluoroscopy was utilized by the requesting physician.  No radiographic interpretation.   DG C-Arm 1-60 Min-No Report Result Date: 12/18/2023 Fluoroscopy was utilized by the requesting physician.  No radiographic interpretation.     Past medical hx Past Medical History:  Diagnosis Date   Anemia    Anxiety    Arthritis    Asthma    Depression    Dyspnea    with exertion   Fibromyalgia 2006   per pt   GERD (gastroesophageal reflux disease)    Hepatitis-C    treated per pt   History of chemotherapy    History of kidney stones     passed stones   Hypertension    Insomnia    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   Lung nodules 07/24/2023   Seen on CT Scan   Lymphoma (HCC) 12/2013   aggressive lymphoma   Memory loss    per pt - mild   Personal history of non-Hodgkin lymphomas    Pneumonia    x 1 age 53yrs old   PTSD (post-traumatic stress disorder)    Stress incontinence in female    Vitamin D  deficiency      Social History[1]  Ms.Murray-Miller reports that she quit smoking about 34 years ago.  Her smoking use included cigarettes. She started smoking about 54 years ago. She has a 30 pack-year smoking history. She has been exposed to tobacco smoke. She has never used smokeless tobacco. She reports that she does not drink alcohol and does not use drugs.  Tobacco Cessation: Counseling given: Not Answered Former smoker   Past surgical hx, Family hx, Social hx all reviewed.  Current Outpatient Medications on File Prior to Visit  Medication Sig   albuterol  (VENTOLIN  HFA) 108 (90 Base) MCG/ACT inhaler INHALE 1-2 PUFFS BY MOUTH EVERY 6 HOURS AS NEEDED FOR WHEEZE OR SHORTNESS OF BREATH   atenolol  (TENORMIN ) 25 MG tablet TAKE 1 TABLET (25 MG TOTAL) BY MOUTH DAILY.   b complex vitamins tablet Take 1 tablet by mouth daily.   busPIRone  (BUSPAR ) 10 MG tablet Take 1 tablet (10 mg total) by mouth 3 (three) times daily.   dimenhyDRINATE  (DRAMAMINE) 50 MG tablet Take 1 tablet (50 mg total) by mouth every 8 (eight) hours as needed.   fluticasone -salmeterol (WIXELA INHUB ) 500-50 MCG/ACT AEPB Inhale 1 puff into the lungs in the morning and at bedtime. Pt has been taking this for years without any issues,   gabapentin  (NEURONTIN ) 100 MG capsule Take 2 capsules (200 mg total) by mouth at bedtime. Follow written titration schedule.   hydrOXYzine  (ATARAX ) 50 MG tablet TAKE 1 TABLET BY MOUTH EVERY DAY AT BEDTIME AS NEEDED   ipratropium-albuterol  (DUONEB) 0.5-2.5 (3) MG/3ML SOLN INHALE 3 MLS VIA NEBULIZER AS DIRECTED   Lactobacillus  Rhamnosus, GG, (CULTURELLE) CAPS Take 1 capsule by mouth daily.   latanoprost  (XALATAN ) 0.005 % ophthalmic solution Place 1 drop into both eyes at bedtime.   linaclotide  (LINZESS ) 290 MCG CAPS capsule Take 290 mcg by mouth daily before breakfast.   lisinopril  (ZESTRIL ) 5 MG tablet TAKE 1 TABLET (5 MG TOTAL) BY MOUTH DAILY.   Magnesium  Oxide -Mg Supplement 500 MG CAPS Take 1 capsule (500 mg total) by mouth in the morning and at bedtime.   meloxicam  (MOBIC ) 15 MG tablet TAKE 1 TABLET (15 MG TOTAL) BY MOUTH DAILY.   mirabegron  ER (MYRBETRIQ ) 25 MG TB24 tablet Take 1 tablet (25 mg total) by mouth daily.   montelukast  (SINGULAIR ) 10 MG tablet Take 1 tablet (10 mg total) by mouth at bedtime.   naloxone  (NARCAN ) nasal spray 4 mg/0.1 mL Place 1 spray into the nose as needed for up to 365 doses (for opioid-induced respiratory depresssion). In case of emergency (overdose), spray once into each nostril. If no response within 3 minutes, repeat application and call 911.   omeprazole  (PRILOSEC) 40 MG capsule TAKE 1 CAPSULE BY MOUTH EVERY DAY   ondansetron  (ZOFRAN ) 4 MG tablet Take 1 tablet (4 mg total) by mouth 2 (two) times daily.   paliperidone  (INVEGA ) 6 MG 24 hr tablet TAKE 1 TABLET (6 MG TOTAL) BY MOUTH EVERY MORNING.   polyethylene glycol (MIRALAX / GLYCOLAX) packet Take 17 g by mouth 2 (two) times daily.   prazosin (MINIPRESS) 1 MG capsule Take 2 mg by mouth at bedtime.   sertraline (ZOLOFT) 100 MG tablet Take 200 mg by mouth every morning.   traMADol  (ULTRAM ) 50 MG tablet Take 1 tablet (50 mg total) by mouth every 6 (six) hours as needed for severe pain (pain score 7-10). Each refill must last 30 days.   traZODone (DESYREL) 50 MG tablet 1 TABLET BY MOUTH AT BEDTIME   Wheat Dextrin (BENEFIBER) POWD Take 6 g by mouth 3 (three) times daily before meals. (2 tsp =  6 g)   No current facility-administered medications on file prior to visit.     Allergies[2]  Review Of Systems:  Constitutional:   No   weight loss, night sweats,  Fevers, chills, fatigue, or  lassitude.  HEENT:   No headaches,  Difficulty swallowing,  Tooth/dental problems, or  Sore throat,                No sneezing, itching, ear ache, nasal congestion, post nasal drip,   CV:  No chest pain,  Orthopnea, PND, swelling in lower extremities, anasarca, dizziness, palpitations, syncope.   GI  No heartburn, indigestion, abdominal pain, nausea, vomiting, diarrhea, change in bowel habits, loss of appetite, bloody stools.   Resp: + shortness of breath with exertion less at rest.  No excess mucus, no productive cough,  + non-productive cough,  No coughing up of blood.  No change in color of mucus.  + wheezing.  No chest wall deformity  Skin: no rash or lesions.  GU: no dysuria, change in color of urine, no urgency or frequency.  No flank pain, no hematuria   MS:  No joint pain or swelling.  No decreased range of motion.  No back pain.  Psych:  No change in mood or affect. No depression or anxiety.  No memory loss.   Vital Signs BP 122/76   Pulse 85   Temp 98.5 F (36.9 C) (Oral)   Ht 5' 6 (1.676 m)   Wt 205 lb (93 kg)   SpO2 99%   BMI 33.09 kg/m    Physical Exam: Physical Exam GENERAL: No distress, alert and oriented times 3. EARS NOSE THROAT: No sinus tenderness, tympanic membranes clear, pale nasal mucosa, no oral exudate, no post nasal drip, no lymphadenopathy. CHEST: Scattered expiratory wheeze in left lower lobe. No rales, dullness, no accessory muscle use, no nasal flaring, no sternal retractions. CARDIAC: S1, S2, regular rate and rhythm, no murmur. ABDOMINAL: Soft, non tender. ND, BS present., Body mass index is 33.09 kg/m.  EXTREMITIES: No clubbing, cyanosis, edema. No obvious deformities. NEUROLOGICAL: Normal strength. Alert and oriented x 3, MAE x 4. SKIN: No rashes, warm and dry. No obvious skin lesions. PSYCHIATRIC: Normal mood and behavior.   Assessment/Plan  Assessment and Plan Assessment &  Plan Asthma with acute exacerbation High airway inflammation confirmed with FENO. Non-compliance with Wixela noted. - Restart Wixela inhaler, one puff in the morning and one puff at bedtime daily. - Rinse mouth after use - Prescribed prednisone  taper: 4 tablets daily for 2 days, 3 tablets daily for 2 days, 2 tablets daily for 2 days, 1 tablet daily for 2 days, then stop. - Use albuterol  inhaler as needed for acute symptoms. - Use DuoNeb nebulizer treatments twice daily or as needed. - Educated on the importance of daily Wixela use to maintain airway inflammation control.  Pulmonary cyst, left lower lobe Post bronchoscopy Biopsies negative for malignancy.  Cultures negative for fungal, acid-fast bacillus, aerobes, and anaerobes. - Negative biopsy reassuring - Schedule follow-up chest CT in 3 months to monitor the left lower lobe cyst. - Advised to report any unexplained weight loss or hemoptysis immediately.  Health Maintenance Plan Flu vaccine today   I spent 25 minutes dedicated to the care of this patient on the date of this encounter to include pre-visit review of records, face-to-face time with the patient discussing conditions above, post visit ordering of testing, clinical documentation with the electronic health record, making appropriate referrals as documented, and communicating necessary  information to the patient's healthcare team.      Lauraine JULIANNA Lites, NP 01/15/2024  9:24 AM             [1]  Social History Tobacco Use   Smoking status: Former    Current packs/day: 0.00    Average packs/day: 1.5 packs/day for 20.0 years (30.0 ttl pk-yrs)    Types: Cigarettes    Start date: 01/09/1970    Quit date: 01/09/1990    Years since quitting: 34.0    Passive exposure: Past   Smokeless tobacco: Never  Vaping Use   Vaping status: Never Used  Substance Use Topics   Alcohol use: No    Alcohol/week: 0.0 standard drinks of alcohol   Drug use: No  [2]  Allergies Allergen  Reactions   Latex Hives   Penicillins Hives   Codeine Anxiety   Cortisone Swelling   Sulfa Antibiotics    "

## 2024-01-15 NOTE — Progress Notes (Signed)
 "  Subjective:  Patient ID: Rachael West, female    DOB: 23-May-1955,  MRN: [id number] HPI Chief Complaint  Patient presents with   Toe Pain    5th toe right - corn x months, tender with pressure   Foot Pain    Feet burn and swelling constantly-non diabetic, no treatment   New Patient (Initial Visit)    69 y.o. female presents with the above complaint.   ROS: Denies fever chills nausea mobic  muscle aches pains calf pain back pain chest pain shortness of breath.  States that her left ankle is also painful as she points to the sinus tarsi area.  She states that she is not a diabetic and she has done nothing for treatment.    Past Medical History:  Diagnosis Date   Anemia    Anxiety    Arthritis    Asthma    Depression    Dyspnea    with exertion   Fibromyalgia 2006   per pt   GERD (gastroesophageal reflux disease)    Hepatitis-C    treated per pt   History of chemotherapy    History of kidney stones    passed stones   Hypertension    Insomnia    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   Lung nodules 07/24/2023   Seen on CT Scan   Lymphoma (HCC) 12/2013   aggressive lymphoma   Memory loss    per pt - mild   Personal history of non-Hodgkin lymphomas    Pneumonia    x 1 age 44yrs old   PTSD (post-traumatic stress disorder)    Stress incontinence in female    Vitamin D  deficiency    Past Surgical History:  Procedure Laterality Date   ABDOMINAL HYSTERECTOMY     APPENDECTOMY     BREAST BIOPSY Left 2015   lymphoma, lymphnode   COLONOSCOPY  01/17/2011   Procedure: COLONOSCOPY;  Surgeon: Lynwood LITTIE Rachael West., MD;  Location: [facility];  Service: Endoscopy;  Laterality: N/A;   COLONOSCOPY  06/15/2021   ESOPHAGOGASTRODUODENOSCOPY  01/17/2011   Procedure: ESOPHAGOGASTRODUODENOSCOPY (EGD);  Surgeon: Lynwood LITTIE Rachael West., MD;  Location: [facility];  Service: Endoscopy;  Laterality: N/A;   TRIGGER FINGER RELEASE     UPPER GI ENDOSCOPY  06/15/2021   VIDEO  BRONCHOSCOPY WITH ENDOBRONCHIAL NAVIGATION Left 12/18/2023   Procedure: VIDEO BRONCHOSCOPY WITH ENDOBRONCHIAL NAVIGATION;  Surgeon: Shelah Lamar RAMAN, MD;  Location: [facility];  Service: Pulmonary;  Laterality: Left;   Current Medications[1]  Allergies[2] Review of Systems Objective:  There were no vitals filed for this visit.  General: Well developed, nourished, in no acute distress, alert and oriented x3   Dermatological: Skin is warm, dry and supple bilateral. Nails x 10 are well maintained; remaining integument appears unremarkable at this time. There are no open sores, no preulcerative lesions, no rash or signs of infection present.  Benign reactive hyperkeratotic neoplasm to the plantar aspect fifth digit right foot.  Vascular: Dorsalis Pedis artery and Posterior Tibial artery pedal pulses are 2/4 bilateral with immedate capillary fill time. Pedal hair growth present. No varicosities and no lower extremity edema present bilateral.   Neruologic: Grossly intact via light touch bilateral. Vibratory intact via tuning fork bilateral. Protective threshold with Semmes Wienstein monofilament intact to all pedal sites bilateral. Patellar and Achilles deep tendon reflexes 2+ bilateral. No Babinski or clonus noted bilateral.   Musculoskeletal: No gross boney pedal deformities bilateral. No pain, crepitus, or limitation noted with foot and  ankle range of motion bilateral. Muscular strength 5/5 in all groups tested bilateral.  Pain on palpation with mild edema to the subtalar joint and pain on end range of motion of the subtalar joint of her left foot.  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    Radiographs:  Radiographs taken today demonstrate an osseously mature individual with osteopenia bilateral.  She also demonstrates some osteoarthritis to the posterior facet of the subtalar joint left foot adductovarus rotated  hammertoe deformity with osteoarthritic changes fifth digit right foot.  Assessment & Plan:   Assessment: Benign skin lesion right foot.  Subtalar joint arthritis left foot.  Plan: Discussed etiology pathology conservative surgical therapies at this point I injected 10 mg of Kenalog  into the subtalar joint of the left foot.  And debrided benign skin plasm right foot.     Rachael West, DPM    [1]  Current Outpatient Medications:    albuterol  (VENTOLIN  HFA) 108 (90 Base) MCG/ACT inhaler, INHALE 1-2 PUFFS BY MOUTH EVERY 6 HOURS AS NEEDED FOR WHEEZE OR SHORTNESS OF BREATH, Disp: 17 each, Rfl: 2   atenolol  (TENORMIN ) 25 MG tablet, TAKE 1 TABLET (25 MG TOTAL) BY MOUTH DAILY., Disp: 90 tablet, Rfl: 1   b complex vitamins tablet, Take 1 tablet by mouth daily., Disp: , Rfl:    busPIRone  (BUSPAR ) 10 MG tablet, Take 1 tablet (10 mg total) by mouth 3 (three) times daily., Disp: 270 tablet, Rfl: 1   dimenhyDRINATE  (DRAMAMINE) 50 MG tablet, Take 1 tablet (50 mg total) by mouth every 8 (eight) hours as needed., Disp: 30 tablet, Rfl: 1   fluticasone -salmeterol (WIXELA INHUB ) 500-50 MCG/ACT AEPB, Inhale 1 puff into the lungs in the morning and at bedtime. Pt has been taking this for years without any issues,, Disp: 1 each, Rfl: 6   gabapentin  (NEURONTIN ) 100 MG capsule, Take 2 capsules (200 mg total) by mouth at bedtime. Follow written titration schedule., Disp: 180 capsule, Rfl: 1   hydrOXYzine  (ATARAX ) 50 MG tablet, TAKE 1 TABLET BY MOUTH EVERY DAY AT BEDTIME AS NEEDED, Disp: 90 tablet, Rfl: 0   ipratropium-albuterol  (DUONEB) 0.5-2.5 (3) MG/3ML SOLN, INHALE 3 MLS VIA NEBULIZER AS DIRECTED, Disp: 270 mL, Rfl: 2   Lactobacillus Rhamnosus, GG, (CULTURELLE) CAPS, Take 1 capsule by mouth daily., Disp: 90 capsule, Rfl: 1   latanoprost  (XALATAN ) 0.005 % ophthalmic solution, Place 1 drop into both eyes at bedtime., Disp: 7.5 mL, Rfl: 2   linaclotide  (LINZESS ) 290 MCG CAPS capsule, Take 290 mcg by mouth daily  before breakfast., Disp: , Rfl:    lisinopril  (ZESTRIL ) 5 MG tablet, TAKE 1 TABLET (5 MG TOTAL) BY MOUTH DAILY., Disp: 90 tablet, Rfl: 1   Magnesium  Oxide -Mg Supplement 500 MG CAPS, Take 1 capsule (500 mg total) by mouth in the morning and at bedtime., Disp: 180 capsule, Rfl: 0   meloxicam  (MOBIC ) 15 MG tablet, TAKE 1 TABLET (15 MG TOTAL) BY MOUTH DAILY., Disp: 90 tablet, Rfl: 1   mirabegron  ER (MYRBETRIQ ) 25 MG TB24 tablet, Take 1 tablet (25 mg total) by mouth daily., Disp: 30 tablet, Rfl: 2   montelukast  (SINGULAIR ) 10 MG tablet, Take 1 tablet (10 mg total) by mouth at bedtime., Disp: 90 tablet, Rfl: 3   naloxone  (NARCAN ) nasal spray 4 mg/0.1 mL, Place 1 spray into the nose as needed for up to 365 doses (for opioid-induced respiratory depresssion). In case of emergency (overdose), spray once into each nostril. If no  response within 3 minutes, repeat application and call 911., Disp: 1 each, Rfl: 1   omeprazole  (PRILOSEC) 40 MG capsule, TAKE 1 CAPSULE BY MOUTH EVERY DAY, Disp: 90 capsule, Rfl: 1   ondansetron  (ZOFRAN ) 4 MG tablet, Take 1 tablet (4 mg total) by mouth 2 (two) times daily., Disp: 60 tablet, Rfl: 3   paliperidone  (INVEGA ) 6 MG 24 hr tablet, TAKE 1 TABLET (6 MG TOTAL) BY MOUTH EVERY MORNING., Disp: 90 tablet, Rfl: 1   polyethylene glycol (MIRALAX / GLYCOLAX) packet, Take 17 g by mouth 2 (two) times daily., Disp: , Rfl:    prazosin (MINIPRESS) 1 MG capsule, Take 2 mg by mouth at bedtime., Disp: , Rfl:    sertraline (ZOLOFT) 100 MG tablet, Take 200 mg by mouth every morning., Disp: , Rfl:    traMADol  (ULTRAM ) 50 MG tablet, Take 1 tablet (50 mg total) by mouth every 6 (six) hours as needed for severe pain (pain score 7-10). Each refill must last 30 days., Disp: 120 tablet, Rfl: 5   traZODone (DESYREL) 50 MG tablet, 1 TABLET BY MOUTH AT BEDTIME, Disp: , Rfl: 5   Wheat Dextrin (BENEFIBER) POWD, Take 6 g by mouth 3 (three) times daily before meals. (2 tsp = 6 g), Disp: 730 g, Rfl: 2    predniSONE  (DELTASONE ) 10 MG tablet, Prednisone  taper; 10 mg tablets: 4 tabs x 2 days, 3 tabs x 2 days, 2 tabs x 2 days 1 tab x 2 days then stop., Disp: 20 tablet, Rfl: 0 [2]  Allergies Allergen Reactions   Latex Hives   Penicillins Hives   Codeine Anxiety   Cortisone Swelling   Sulfa Antibiotics    "

## 2024-01-17 LAB — FUNGAL ORGANISM REFLEX

## 2024-01-17 LAB — FUNGUS CULTURE WITH STAIN

## 2024-01-17 LAB — FUNGUS CULTURE RESULT

## 2024-01-26 ENCOUNTER — Other Ambulatory Visit: Payer: Self-pay | Admitting: Family Medicine

## 2024-01-26 DIAGNOSIS — F419 Anxiety disorder, unspecified: Secondary | ICD-10-CM

## 2024-01-26 DIAGNOSIS — M797 Fibromyalgia: Secondary | ICD-10-CM

## 2024-02-01 LAB — ACID FAST CULTURE WITH REFLEXED SENSITIVITIES (MYCOBACTERIA): Acid Fast Culture: NEGATIVE

## 2024-02-02 ENCOUNTER — Other Ambulatory Visit: Payer: Self-pay

## 2024-02-13 ENCOUNTER — Encounter: Payer: Self-pay | Admitting: Nurse Practitioner

## 2024-02-13 ENCOUNTER — Ambulatory Visit: Admitting: Nurse Practitioner

## 2024-02-13 VITALS — BP 157/67 | HR 86 | Temp 97.4°F | Resp 18 | Ht 66.0 in | Wt 199.0 lb

## 2024-02-13 DIAGNOSIS — M4802 Spinal stenosis, cervical region: Secondary | ICD-10-CM

## 2024-02-13 DIAGNOSIS — M542 Cervicalgia: Secondary | ICD-10-CM

## 2024-02-13 DIAGNOSIS — G8929 Other chronic pain: Secondary | ICD-10-CM

## 2024-02-13 DIAGNOSIS — Z79899 Other long term (current) drug therapy: Secondary | ICD-10-CM

## 2024-02-13 DIAGNOSIS — M47812 Spondylosis without myelopathy or radiculopathy, cervical region: Secondary | ICD-10-CM

## 2024-02-13 DIAGNOSIS — Z79891 Long term (current) use of opiate analgesic: Secondary | ICD-10-CM

## 2024-02-13 DIAGNOSIS — M50122 Cervical disc disorder at C5-C6 level with radiculopathy: Secondary | ICD-10-CM

## 2024-02-13 DIAGNOSIS — M5412 Radiculopathy, cervical region: Secondary | ICD-10-CM

## 2024-02-13 DIAGNOSIS — G894 Chronic pain syndrome: Secondary | ICD-10-CM

## 2024-02-13 MED ORDER — TRAMADOL HCL 50 MG PO TABS: 50.0000 mg | ORAL_TABLET | Freq: Four times a day (QID) | ORAL | 5 refills | Status: AC | PRN

## 2024-02-13 NOTE — Progress Notes (Signed)
 Nursing Pain Medication Assessment:  Safety precautions to be maintained throughout the outpatient stay will include: orient to surroundings, keep bed in low position, maintain call bell within reach at all times, provide assistance with transfer out of bed and ambulation.  Medication Inspection Compliance: Pill count conducted under aseptic conditions, in front of the patient. Neither the pills nor the bottle was removed from the patient's sight at any time. Once count was completed pills were immediately returned to the patient in their original bottle.  Medication: Tramadol  (Ultram ) Pill/Patch Count: 0 of 120 pills/patches remain Pill/Patch Appearance: Markings consistent with prescribed medication Bottle Appearance: Standard pharmacy container. Clearly labeled. Filled Date: 63 / 27 / 2025 Last Medication intake:  Today

## 2024-02-13 NOTE — Patient Instructions (Signed)

## 2024-02-14 ENCOUNTER — Other Ambulatory Visit: Payer: Self-pay | Admitting: Family Medicine

## 2024-02-14 DIAGNOSIS — F419 Anxiety disorder, unspecified: Secondary | ICD-10-CM

## 2024-02-21 ENCOUNTER — Ambulatory Visit: Admitting: Pain Medicine

## 2024-05-12 ENCOUNTER — Encounter: Admitting: Nurse Practitioner

## 2024-08-12 ENCOUNTER — Encounter: Admitting: Nurse Practitioner

## 2024-08-28 ENCOUNTER — Ambulatory Visit
